# Patient Record
Sex: Female | Born: 1935 | Race: White | Hispanic: No | Smoking: Never smoker
Health system: Southern US, Community
[De-identification: ages and names within clinical notes are randomized; demographics above are authoritative.]

## PROBLEM LIST (undated history)

## (undated) DIAGNOSIS — K5730 Diverticulosis of large intestine without perforation or abscess without bleeding: Secondary | ICD-10-CM

## (undated) DIAGNOSIS — R7309 Other abnormal glucose: Secondary | ICD-10-CM

## (undated) DIAGNOSIS — M1990 Unspecified osteoarthritis, unspecified site: Secondary | ICD-10-CM

## (undated) DIAGNOSIS — F0390 Unspecified dementia without behavioral disturbance: Secondary | ICD-10-CM

## (undated) DIAGNOSIS — J449 Chronic obstructive pulmonary disease, unspecified: Secondary | ICD-10-CM

## (undated) DIAGNOSIS — D649 Anemia, unspecified: Secondary | ICD-10-CM

## (undated) DIAGNOSIS — M545 Low back pain, unspecified: Secondary | ICD-10-CM

## (undated) DIAGNOSIS — I4510 Unspecified right bundle-branch block: Secondary | ICD-10-CM

## (undated) DIAGNOSIS — I10 Essential (primary) hypertension: Secondary | ICD-10-CM

## (undated) DIAGNOSIS — M4800 Spinal stenosis, site unspecified: Secondary | ICD-10-CM

## (undated) DIAGNOSIS — S42302A Unspecified fracture of shaft of humerus, left arm, initial encounter for closed fracture: Secondary | ICD-10-CM

## (undated) DIAGNOSIS — N6009 Solitary cyst of unspecified breast: Secondary | ICD-10-CM

## (undated) DIAGNOSIS — E785 Hyperlipidemia, unspecified: Secondary | ICD-10-CM

## (undated) DIAGNOSIS — N179 Acute kidney failure, unspecified: Secondary | ICD-10-CM

## (undated) DIAGNOSIS — N183 Chronic kidney disease, stage 3 unspecified: Secondary | ICD-10-CM

## (undated) DIAGNOSIS — K219 Gastro-esophageal reflux disease without esophagitis: Secondary | ICD-10-CM

## (undated) DIAGNOSIS — R0602 Shortness of breath: Secondary | ICD-10-CM

## (undated) HISTORY — DX: Solitary cyst of unspecified breast: N60.09

## (undated) HISTORY — DX: Low back pain: M54.5

## (undated) HISTORY — PX: CATARACT EXTRACTION: SUR2

## (undated) HISTORY — DX: Unspecified right bundle-branch block: I45.10

## (undated) HISTORY — DX: Unspecified osteoarthritis, unspecified site: M19.90

## (undated) HISTORY — DX: Other abnormal glucose: R73.09

## (undated) HISTORY — DX: Unspecified fracture of shaft of humerus, left arm, initial encounter for closed fracture: S42.302A

## (undated) HISTORY — DX: Diverticulosis of large intestine without perforation or abscess without bleeding: K57.30

## (undated) HISTORY — DX: Gastro-esophageal reflux disease without esophagitis: K21.9

## (undated) HISTORY — DX: Shortness of breath: R06.02

## (undated) HISTORY — DX: Low back pain, unspecified: M54.50

## (undated) HISTORY — DX: Hyperlipidemia, unspecified: E78.5

## (undated) HISTORY — DX: Spinal stenosis, site unspecified: M48.00

## (undated) HISTORY — PX: OTHER SURGICAL HISTORY: SHX169

## (undated) HISTORY — DX: Essential (primary) hypertension: I10

## (undated) HISTORY — DX: Anemia, unspecified: D64.9

---

## 1999-08-08 ENCOUNTER — Other Ambulatory Visit: Admission: RE | Admit: 1999-08-08 | Discharge: 1999-08-08 | Payer: Self-pay | Admitting: *Deleted

## 2001-04-07 ENCOUNTER — Ambulatory Visit: Admission: RE | Admit: 2001-04-07 | Discharge: 2001-04-07 | Payer: Self-pay | Admitting: Pulmonary Disease

## 2001-04-16 ENCOUNTER — Other Ambulatory Visit: Admission: RE | Admit: 2001-04-16 | Discharge: 2001-04-16 | Payer: Self-pay | Admitting: *Deleted

## 2001-04-29 ENCOUNTER — Encounter: Admission: RE | Admit: 2001-04-29 | Discharge: 2001-04-29 | Payer: Self-pay | Admitting: *Deleted

## 2001-04-29 ENCOUNTER — Encounter: Payer: Self-pay | Admitting: *Deleted

## 2001-08-06 ENCOUNTER — Other Ambulatory Visit: Admission: RE | Admit: 2001-08-06 | Discharge: 2001-08-06 | Payer: Self-pay | Admitting: Internal Medicine

## 2001-08-06 ENCOUNTER — Encounter (INDEPENDENT_AMBULATORY_CARE_PROVIDER_SITE_OTHER): Payer: Self-pay | Admitting: Specialist

## 2001-08-12 ENCOUNTER — Ambulatory Visit (HOSPITAL_COMMUNITY): Admission: RE | Admit: 2001-08-12 | Discharge: 2001-08-12 | Payer: Self-pay | Admitting: Internal Medicine

## 2001-08-12 ENCOUNTER — Encounter: Payer: Self-pay | Admitting: Internal Medicine

## 2001-10-17 ENCOUNTER — Ambulatory Visit (HOSPITAL_COMMUNITY): Admission: RE | Admit: 2001-10-17 | Discharge: 2001-10-17 | Payer: Self-pay | Admitting: Pulmonary Disease

## 2002-12-22 ENCOUNTER — Ambulatory Visit (HOSPITAL_COMMUNITY): Admission: RE | Admit: 2002-12-22 | Discharge: 2002-12-22 | Payer: Self-pay | Admitting: Pulmonary Disease

## 2003-06-23 ENCOUNTER — Ambulatory Visit (HOSPITAL_COMMUNITY): Admission: RE | Admit: 2003-06-23 | Discharge: 2003-06-23 | Payer: Self-pay | Admitting: Pulmonary Disease

## 2004-10-02 ENCOUNTER — Ambulatory Visit: Payer: Self-pay | Admitting: Pulmonary Disease

## 2005-01-01 ENCOUNTER — Ambulatory Visit: Payer: Self-pay | Admitting: Pulmonary Disease

## 2005-05-03 ENCOUNTER — Ambulatory Visit: Payer: Self-pay | Admitting: Pulmonary Disease

## 2005-05-23 ENCOUNTER — Ambulatory Visit (HOSPITAL_COMMUNITY): Admission: RE | Admit: 2005-05-23 | Discharge: 2005-05-23 | Payer: Self-pay | Admitting: Pulmonary Disease

## 2005-07-06 ENCOUNTER — Ambulatory Visit: Payer: Self-pay | Admitting: Pulmonary Disease

## 2005-09-04 ENCOUNTER — Ambulatory Visit: Payer: Self-pay | Admitting: Pulmonary Disease

## 2005-09-28 ENCOUNTER — Ambulatory Visit: Payer: Self-pay

## 2006-01-08 ENCOUNTER — Ambulatory Visit: Payer: Self-pay | Admitting: Pulmonary Disease

## 2006-06-20 ENCOUNTER — Ambulatory Visit: Payer: Self-pay | Admitting: Pulmonary Disease

## 2006-07-09 ENCOUNTER — Ambulatory Visit: Payer: Self-pay | Admitting: Pulmonary Disease

## 2006-09-18 ENCOUNTER — Ambulatory Visit: Payer: Self-pay | Admitting: Pulmonary Disease

## 2006-12-27 ENCOUNTER — Ambulatory Visit: Payer: Self-pay | Admitting: Pulmonary Disease

## 2006-12-27 LAB — CONVERTED CEMR LAB
Alkaline Phosphatase: 64 units/L (ref 39–117)
BUN: 12 mg/dL (ref 6–23)
Basophils Absolute: 0.1 10*3/uL (ref 0.0–0.1)
Basophils Relative: 0.9 % (ref 0.0–1.0)
CO2: 31 meq/L (ref 19–32)
GFR calc Af Amer: 89 mL/min
Hemoglobin: 15.4 g/dL — ABNORMAL HIGH (ref 12.0–15.0)
Iron: 63 ug/dL (ref 42–145)
Lymphocytes Relative: 31.8 % (ref 12.0–46.0)
MCHC: 35 g/dL (ref 30.0–36.0)
Monocytes Absolute: 0.4 10*3/uL (ref 0.2–0.7)
Monocytes Relative: 6.8 % (ref 3.0–11.0)
Neutro Abs: 3.3 10*3/uL (ref 1.4–7.7)
Potassium: 3.8 meq/L (ref 3.5–5.1)
Saturation Ratios: 26.7 % (ref 20.0–50.0)
Sodium: 144 meq/L (ref 135–145)
Total Protein: 7 g/dL (ref 6.0–8.3)
Transferrin: 168.6 mg/dL — ABNORMAL LOW (ref >=212.0)

## 2007-06-25 ENCOUNTER — Ambulatory Visit: Payer: Self-pay | Admitting: Pulmonary Disease

## 2007-09-25 ENCOUNTER — Encounter: Payer: Self-pay | Admitting: Pulmonary Disease

## 2007-09-28 ENCOUNTER — Encounter: Admission: RE | Admit: 2007-09-28 | Discharge: 2007-09-28 | Payer: Self-pay | Admitting: Neurosurgery

## 2007-10-02 ENCOUNTER — Encounter: Payer: Self-pay | Admitting: Pulmonary Disease

## 2007-10-27 ENCOUNTER — Telehealth (INDEPENDENT_AMBULATORY_CARE_PROVIDER_SITE_OTHER): Payer: Self-pay | Admitting: *Deleted

## 2007-10-27 DIAGNOSIS — M545 Low back pain: Secondary | ICD-10-CM

## 2007-10-27 DIAGNOSIS — R7301 Impaired fasting glucose: Secondary | ICD-10-CM | POA: Insufficient documentation

## 2007-10-27 DIAGNOSIS — D649 Anemia, unspecified: Secondary | ICD-10-CM

## 2007-10-27 DIAGNOSIS — I4510 Unspecified right bundle-branch block: Secondary | ICD-10-CM

## 2007-10-27 DIAGNOSIS — I10 Essential (primary) hypertension: Secondary | ICD-10-CM

## 2007-10-27 DIAGNOSIS — E785 Hyperlipidemia, unspecified: Secondary | ICD-10-CM | POA: Insufficient documentation

## 2007-10-28 ENCOUNTER — Encounter: Payer: Self-pay | Admitting: Adult Health

## 2007-10-28 ENCOUNTER — Ambulatory Visit: Payer: Self-pay | Admitting: Pulmonary Disease

## 2007-10-28 DIAGNOSIS — M48061 Spinal stenosis, lumbar region without neurogenic claudication: Secondary | ICD-10-CM | POA: Insufficient documentation

## 2007-11-03 LAB — CONVERTED CEMR LAB
BUN: 28 mg/dL — ABNORMAL HIGH (ref 6–23)
Basophils Absolute: 0 10*3/uL (ref 0.0–0.1)
Calcium: 10 mg/dL (ref 8.4–10.5)
Chloride: 101 meq/L (ref 96–112)
Eosinophils Absolute: 0.1 10*3/uL (ref 0.0–0.6)
Eosinophils Relative: 1.2 % (ref 0.0–5.0)
GFR calc Af Amer: 68 mL/min
GFR calc non Af Amer: 56 mL/min
Iron: 42 ug/dL (ref 42–145)
MCHC: 34.5 g/dL (ref 30.0–36.0)
MCV: 92.9 fL (ref 78.0–100.0)
Monocytes Relative: 5.8 % (ref 3.0–11.0)
Neutro Abs: 6.4 10*3/uL (ref 1.4–7.7)
Platelets: 160 10*3/uL (ref 150–400)
RBC: 4.89 M/uL (ref 3.87–5.11)
TSH: 1.84 microintl units/mL (ref 0.35–5.50)
Transferrin: 183.1 mg/dL — ABNORMAL LOW (ref >=212.0)
WBC: 8.4 10*3/uL (ref 4.5–10.5)

## 2007-11-24 ENCOUNTER — Inpatient Hospital Stay (HOSPITAL_COMMUNITY): Admission: EM | Admit: 2007-11-24 | Discharge: 2007-11-28 | Payer: Self-pay | Admitting: Emergency Medicine

## 2007-11-24 ENCOUNTER — Ambulatory Visit: Payer: Self-pay | Admitting: Internal Medicine

## 2007-11-28 ENCOUNTER — Ambulatory Visit: Payer: Self-pay | Admitting: Internal Medicine

## 2007-12-01 ENCOUNTER — Ambulatory Visit: Payer: Self-pay | Admitting: Pulmonary Disease

## 2007-12-01 DIAGNOSIS — N12 Tubulo-interstitial nephritis, not specified as acute or chronic: Secondary | ICD-10-CM | POA: Insufficient documentation

## 2007-12-01 DIAGNOSIS — K5900 Constipation, unspecified: Secondary | ICD-10-CM

## 2007-12-01 DIAGNOSIS — M1990 Unspecified osteoarthritis, unspecified site: Secondary | ICD-10-CM

## 2007-12-01 DIAGNOSIS — K5730 Diverticulosis of large intestine without perforation or abscess without bleeding: Secondary | ICD-10-CM

## 2007-12-01 DIAGNOSIS — R0602 Shortness of breath: Secondary | ICD-10-CM | POA: Insufficient documentation

## 2007-12-01 DIAGNOSIS — N6009 Solitary cyst of unspecified breast: Secondary | ICD-10-CM | POA: Insufficient documentation

## 2008-01-12 ENCOUNTER — Ambulatory Visit: Payer: Self-pay | Admitting: Pulmonary Disease

## 2008-03-09 ENCOUNTER — Encounter: Payer: Self-pay | Admitting: Pulmonary Disease

## 2008-05-03 ENCOUNTER — Telehealth (INDEPENDENT_AMBULATORY_CARE_PROVIDER_SITE_OTHER): Payer: Self-pay | Admitting: *Deleted

## 2008-06-03 ENCOUNTER — Telehealth: Payer: Self-pay | Admitting: Pulmonary Disease

## 2008-06-15 ENCOUNTER — Ambulatory Visit: Payer: Self-pay | Admitting: Pulmonary Disease

## 2008-06-16 LAB — CONVERTED CEMR LAB
Bilirubin Urine: NEGATIVE
Hemoglobin, Urine: NEGATIVE
Mucus, UA: NEGATIVE
Nitrite: NEGATIVE

## 2008-06-22 ENCOUNTER — Encounter: Payer: Self-pay | Admitting: Pulmonary Disease

## 2008-08-11 ENCOUNTER — Ambulatory Visit: Payer: Self-pay | Admitting: Pulmonary Disease

## 2008-09-08 ENCOUNTER — Telehealth: Payer: Self-pay | Admitting: Pulmonary Disease

## 2008-09-13 ENCOUNTER — Ambulatory Visit: Payer: Self-pay | Admitting: Pulmonary Disease

## 2008-09-13 ENCOUNTER — Telehealth: Payer: Self-pay | Admitting: Pulmonary Disease

## 2008-09-13 ENCOUNTER — Inpatient Hospital Stay (HOSPITAL_COMMUNITY): Admission: EM | Admit: 2008-09-13 | Discharge: 2008-09-20 | Payer: Self-pay | Admitting: Emergency Medicine

## 2008-10-20 ENCOUNTER — Telehealth: Payer: Self-pay | Admitting: Adult Health

## 2008-10-21 ENCOUNTER — Ambulatory Visit: Payer: Self-pay | Admitting: Pulmonary Disease

## 2008-10-21 DIAGNOSIS — R609 Edema, unspecified: Secondary | ICD-10-CM | POA: Insufficient documentation

## 2008-10-26 ENCOUNTER — Ambulatory Visit: Payer: Self-pay

## 2008-10-26 ENCOUNTER — Encounter: Payer: Self-pay | Admitting: Pulmonary Disease

## 2008-12-13 ENCOUNTER — Ambulatory Visit: Payer: Self-pay | Admitting: Pulmonary Disease

## 2008-12-13 DIAGNOSIS — L120 Bullous pemphigoid: Secondary | ICD-10-CM

## 2008-12-13 DIAGNOSIS — N390 Urinary tract infection, site not specified: Secondary | ICD-10-CM

## 2008-12-26 LAB — CONVERTED CEMR LAB
ALT: 18 units/L (ref 0–35)
Basophils Absolute: 0 10*3/uL (ref 0.0–0.1)
Bilirubin Urine: NEGATIVE
Bilirubin, Direct: 0.1 mg/dL (ref 0.0–0.3)
CO2: 36 meq/L — ABNORMAL HIGH (ref 19–32)
Calcium: 10.5 mg/dL (ref 8.4–10.5)
Creatinine, Ser: 0.9 mg/dL (ref 0.4–1.2)
Crystals: NEGATIVE
GFR calc Af Amer: 77 mL/min
GFR calc non Af Amer: 64 mL/min
HCT: 42.2 % (ref 36.0–46.0)
Hemoglobin, Urine: NEGATIVE
Hemoglobin: 14.6 g/dL (ref 12.0–15.0)
Iron: 94 ug/dL (ref 42–145)
MCHC: 34.5 g/dL (ref 30.0–36.0)
Monocytes Absolute: 0.4 10*3/uL (ref 0.1–1.0)
Monocytes Relative: 7.4 % (ref 3.0–12.0)
Neutro Abs: 3.1 10*3/uL (ref 1.4–7.7)
Nitrite: NEGATIVE
Platelets: 175 10*3/uL (ref 150–400)
RDW: 13.7 % (ref 11.5–14.6)
Saturation Ratios: 33.9 % (ref 20.0–50.0)
Sodium: 144 meq/L (ref 135–145)
TSH: 2.42 microintl units/mL (ref 0.35–5.50)
Total Bilirubin: 0.8 mg/dL (ref 0.3–1.2)
Transferrin: 197.8 mg/dL — ABNORMAL LOW (ref >=212.0)
Urobilinogen, UA: 0.2 (ref 0.0–1.0)

## 2009-04-21 ENCOUNTER — Telehealth (INDEPENDENT_AMBULATORY_CARE_PROVIDER_SITE_OTHER): Payer: Self-pay | Admitting: *Deleted

## 2009-04-22 ENCOUNTER — Ambulatory Visit: Payer: Self-pay | Admitting: Internal Medicine

## 2009-05-31 ENCOUNTER — Emergency Department (HOSPITAL_COMMUNITY): Admission: EM | Admit: 2009-05-31 | Discharge: 2009-06-01 | Payer: Self-pay | Admitting: Emergency Medicine

## 2009-06-13 ENCOUNTER — Ambulatory Visit: Payer: Self-pay | Admitting: Pulmonary Disease

## 2009-06-22 ENCOUNTER — Encounter: Payer: Self-pay | Admitting: Pulmonary Disease

## 2009-12-12 ENCOUNTER — Ambulatory Visit: Payer: Self-pay | Admitting: Pulmonary Disease

## 2010-04-25 ENCOUNTER — Ambulatory Visit (HOSPITAL_COMMUNITY): Admission: RE | Admit: 2010-04-25 | Discharge: 2010-04-25 | Payer: Self-pay | Admitting: Orthopedic Surgery

## 2010-05-18 ENCOUNTER — Ambulatory Visit: Payer: Self-pay | Admitting: Pulmonary Disease

## 2010-07-10 ENCOUNTER — Encounter: Payer: Self-pay | Admitting: Pulmonary Disease

## 2010-09-13 ENCOUNTER — Inpatient Hospital Stay (HOSPITAL_COMMUNITY): Admission: RE | Admit: 2010-09-13 | Discharge: 2010-09-18 | Payer: Self-pay | Admitting: Orthopedic Surgery

## 2010-09-15 ENCOUNTER — Telehealth: Payer: Self-pay | Admitting: Pulmonary Disease

## 2010-10-10 ENCOUNTER — Telehealth (INDEPENDENT_AMBULATORY_CARE_PROVIDER_SITE_OTHER): Payer: Self-pay | Admitting: *Deleted

## 2010-10-18 ENCOUNTER — Telehealth: Payer: Self-pay | Admitting: Pulmonary Disease

## 2010-10-19 ENCOUNTER — Ambulatory Visit: Payer: Self-pay | Admitting: Pulmonary Disease

## 2010-10-23 ENCOUNTER — Encounter: Payer: Self-pay | Admitting: Pulmonary Disease

## 2010-10-23 LAB — CONVERTED CEMR LAB
ALT: 11 units/L (ref 0–35)
BUN: 12 mg/dL (ref 6–23)
Basophils Relative: 0.5 % (ref 0.0–3.0)
CO2: 34 meq/L — ABNORMAL HIGH (ref 19–32)
Calcium: 9.8 mg/dL (ref 8.4–10.5)
Chloride: 96 meq/L (ref 96–112)
Creatinine, Ser: 0.9 mg/dL (ref 0.4–1.2)
Eosinophils Relative: 1.4 % (ref 0.0–5.0)
Hemoglobin: 12.8 g/dL (ref 12.0–15.0)
Lymphocytes Relative: 27 % (ref 12.0–46.0)
Neutrophils Relative %: 63.9 % (ref 43.0–77.0)
RBC: 4.23 M/uL (ref 3.87–5.11)
Saturation Ratios: 10.7 % — ABNORMAL LOW (ref 20.0–50.0)
Total Bilirubin: 0.7 mg/dL (ref 0.3–1.2)
Total Protein: 6.9 g/dL (ref 6.0–8.3)
Transferrin: 220.3 mg/dL (ref 212.0–360.0)
WBC: 6.6 10*3/uL (ref 4.5–10.5)

## 2010-10-26 ENCOUNTER — Encounter: Payer: Self-pay | Admitting: Pulmonary Disease

## 2010-10-26 ENCOUNTER — Telehealth: Payer: Self-pay | Admitting: Pulmonary Disease

## 2010-10-26 ENCOUNTER — Ambulatory Visit: Payer: Self-pay | Admitting: Pulmonary Disease

## 2010-10-26 LAB — CONVERTED CEMR LAB
Nitrite: NEGATIVE
Specific Gravity, Urine: 1.015 (ref 1.000–1.030)
Urobilinogen, UA: 0.2 (ref 0.0–1.0)

## 2010-11-14 ENCOUNTER — Ambulatory Visit: Payer: Self-pay | Admitting: Pulmonary Disease

## 2010-12-05 ENCOUNTER — Telehealth (INDEPENDENT_AMBULATORY_CARE_PROVIDER_SITE_OTHER): Payer: Self-pay | Admitting: *Deleted

## 2010-12-21 NOTE — Assessment & Plan Note (Signed)
Summary: 6 MONTH FOLLOW UP--RSC FROM 7-12/PT HERE AT 2:30/LA   CC:  5-6 month ROV & review of mult medical problems....  History of Present Illness: 77 y/o WF here for a follow up visit... she has multiple medical problems as noted below...  Followed for general medical purposes w/ hx chr obstructive asthma, HBP, RBBB, Hypercholesterolemia, borderline DM, DJD, LBP w/ sp stenosis, etc...   ~  December 12, 2009:  good 28mo- only c/o arthritis- esp legs & neck...  given Lyrica by DrAplington ?post shingles pain?... her dyspnea is improved;  BP controlled on meds;  Chol ? & sugar OK on diet alone (reminded that she needs to be fasting for FLP)...    ~  May 18, 2010:  she's had incr difficulty w/ her right THR & DrOlin diagnosed a prob w/ the cement & loosening of the prosthesis- needs revision but she is reluctant...  BP controlled on meds;  no CP/ palpit/ etc;  otherw stable & we discussed once again returning for FASTING blood work...   Current Problem List:  DYSPNEA (ICD-786.05) - long hx of chronic obstructive asthma treated w/ ADVAIR & PROAIR (she uses them Prn now)... she is a non-smoker w/ some reactive airways disease in the past & retired from [employer] after 33 years in 1991... she denies cough, sputum, hemoptysis, worsening dyspnea,  wheezing, chest pains, snoring, daytime hypersomnolence, etc...   ~  baseline CXR w/o acute changes...   ~  PFT's 5/02 w/ FVC 2.07 (68%), FEV1=1.36 (57%), and FEV1/FVC ratio=66%, mid-flows 42%...  ~  CT Angio 7/10 was neg- x biapical pleuroparenchymal scarring...  HYPERTENSION (ICD-401.9) - controlled on NORVASC 10mg tabs- 1/2 tab daily & HCTZ 25mg tab- 1/2tab Daily... BP 130/78 today... tol rx well & denies HA, visual changes, CP, palipit, dizziness, syncope, edema, etc...  RIGHT BUNDLE BRANCH BLOCK (ICD-426.4) - on ASA 81mg /d... baseline EKG w/ RBBB and 2DEcho 5/02 showed mild asymmetric LVH w/ incr EF...  HYPERLIPIDEMIA (ICD-272.4) - on diet alone... she  forgets to come to visits FASTING for this blood work  ~  FLP 8/07 showed TChol 205, TG 71, HDL 49, LDL 130...  DIABETES MELLITUS, BORDERLINE (ICD-790.29) - on diet alone w/ prev BS's in the 100-160 range...  ~  labs in 2008-9 showed BS= 101 to 108...  ~  labs 1/10 showed BS= 106, A1c= 5.9  DIVERTICULOSIS OF COLON (ICD-562.10) - she takes MIRALAX Prn...last colonoscopy 9/02 by DrPerry was WNL...  PYELONEPHRITIS (ICD-590.80) - SEE 1/09 Hospitalization (reviewed)...  Hx of BREAST CYST (ICD-610.0)  DEGENERATIVE JOINT DISEASE (ICD-715.90) - s/p right hip surg 11/09 by DrAplington w/ wound complic...  LOW BACK PAIN SYNDROME (ICD-724.2) & SPINAL STENOSIS (ICD-724.00) - severe LBP & spinal stenosis w/ evals by DrRamos & DrNudelman... s/p shots, considering poss surgery vs alternative therapies... she takes Celebrex, Osteobiflex, MVI, Vit D...  ~  8/10: eval by DrAplington- diff leg lengths, lift placed in right shoe, then trial Lyrica50mg ...  Hx of ANEMIA (ICD-285.9) - eval by GI in 2002 showed normal EGD and colon... prob iron malabsorption problem Rx'd w/ Fe infusion...  ~  labs 1/10 showed Hg= 14.6, MCV= 89, Fe= 94  DERM:  rash Rx'd by dermatology- OLUX-E foam= clobetasol Foam 0.05%...   Preventive Screening-Counseling & Management  Alcohol-Tobacco     Smoking Status: never  Allergies: 1)  ! Adhesive Tape  Comments:  Nurse/Medical Assistant: The patient's medications and allergies were reviewed with the patient and were updated in the Medication and Allergy Lists.  Past History:  Past Medical History: DYSPNEA (ICD-786.05) HYPERTENSION (ICD-401.9) RIGHT BUNDLE BRANCH BLOCK (ICD-426.4) HYPERLIPIDEMIA (ICD-272.4) DIABETES MELLITUS, BORDERLINE (ICD-790.29) DIVERTICULOSIS OF COLON (ICD-562.10) PYELONEPHRITIS (ICD-590.80) Hx of BREAST CYST (ICD-610.0) DEGENERATIVE JOINT DISEASE (ICD-715.90) LOW BACK PAIN SYNDROME (ICD-724.2) SPINAL STENOSIS (ICD-724.00) Hx of ANEMIA  (ICD-285.9)  Past Surgical History: Cataract extraction S/P right hip surgery for fracture after fall 11/09 by DrAplington  Family History: Reviewed history from 06/15/2008 and no changes required. mother deceased age 23   hx fractured hip father deceased age 81  hx of fluid in lungs 1 brother deceased age 66--cancer 1 brother deceased age 36--cancer 1 brother deceased age 27--unknown hx 3 siblings still living  Social History: Reviewed history from 06/15/2008 and no changes required. Patient never smoked.  retired  Review of Systems      See HPI       The patient complains of decreased hearing, dyspnea on exertion, and difficulty walking.  The patient denies anorexia, fever, weight loss, weight gain, vision loss, hoarseness, chest pain, syncope, peripheral edema, prolonged cough, headaches, hemoptysis, abdominal pain, melena, hematochezia, severe indigestion/heartburn, hematuria, incontinence, muscle weakness, suspicious skin lesions, transient blindness, depression, unusual weight change, abnormal bleeding, enlarged lymph nodes, and angioedema.    Vital Signs:  Patient profile:   77 year old female Height:      68 inches Weight:      185 pounds BMI:     28.23 O2 Sat:      94 % on Room air Temp:     97.6 degrees F oral Pulse rate:   80 / minute BP sitting:   130 / 78  (left arm) Cuff size:   large  Vitals Entered By: Randell Loop CMA (May 18, 2010 2:19 PM)  O2 Flow:  Room air CC: 5-6 month ROV & review of mult medical problems... Is Patient Diabetic? No Pain Assessment Patient in pain? yes      Onset of pain  hip pain   Physical Exam  Additional Exam:  WD, WN, Chr ill appearing 77 y/o WF in NAD... GENERAL:  Alert & oriented; pleasant & cooperative... HEENT:  [place]/AT, EOM-full, EACs-clear, TMs-wnl, NOSE-clear, THROAT-clear & wnl. NECK:  Supple w/ fairROM; no JVD; normal carotid impulses w/o bruits; no thyromegaly or nodules palpated; no lymphadenopathy. CHEST:   Clear to P & A; without wheezes/ rales/ or rhonchi heard... HEART:  Regular Rhythm; without murmurs/ rubs/ or gallops detected... ABDOMEN:  Soft & nontender; normal bowel sounds; no organomegaly or masses palpated... EXT: without deformities, mod arthritic changes, +venous insuffic & tr edema., scattered varicose veins... NEURO:  CN's intact; motor testing normal; no focal deficits... DERM:   mild intertrig rash under breast, & onychomycosis of toenails...    Impression & Recommendations:  Problem # 1:  DYSPNEA (ICD-786.05) She has chr obstructive asthma> no exac, she prefers to use the Advair Prn only...  Problem # 2:  HYPERTENSION (ICD-401.9) Controlled on Rx>  continue same. Her updated medication list for this problem includes:    Norvasc 10 Mg Tabs (Amlodipine besylate) .[name]... Take 1/2 tablet by mouth once daily    Hydrochlorothiazide 25 Mg Tabs (Hydrochlorothiazide) .[name]... Take 1  tablet  by mouth once daily  Problem # 3:  HYPERLIPIDEMIA (ICD-272.4) Needs to ret for FLP... continue diet + exercise...  Problem # 4:  DIABETES MELLITUS, BORDERLINE (ICD-790.29) Also diet controlled...  Problem # 5:  DIVERTICULOSIS OF COLON (ICD-562.10) GI is stable on MIRALAX Rx...  Problem # 6:  DEGENERATIVE JOINT DISEASE (  ICD-715.90) Threasa Heads is now followed by drOlin> ?timing of right THR revision?  Complete Medication List: 1)  Advair Diskus 100-50 Mcg/dose Misc (Fluticasone-salmeterol) .... Inhale 2 puffs two times a day as needed 2)  Proair Hfa 108 (90 Base) Mcg/act Aers (Albuterol sulfate) .... 2 sprays every 4-6 hours as needed for wheezing 3)  Norvasc 10 Mg Tabs (Amlodipine besylate) .... Take 1/2 tablet by mouth once daily 4)  Hydrochlorothiazide 25 Mg Tabs (Hydrochlorothiazide) .... Take 1  tablet  by mouth once daily 5)  Pepcid 20 Mg Tabs (Famotidine) .... Take 1 tab by mouth two times a day as needed... 6)  Miralax Powd (Polyethylene glycol 3350) .... Mix 1 capful in water daily.Marland KitchenMarland Kitchen 7)   Osteo Bi-flex Joint Shield Tabs (Misc natural products) .... Take 1 tablet by mouth two times a day 8)  Centrum Silver Tabs (Multiple vitamins-minerals) .... Take 1 tablet by mouth once a day 9)  Vitamin D 400 Unit Tabs (Cholecalciferol) .... Take 1 tablet by mouth once a day 10)  Ferrous Sulfate 325 (65 Fe) Mg Tbec (Ferrous sulfate) .... Take 1 tab by mouth once daily... 11)  Lyrica 50 Mg Caps (Pregabalin) .... Take one by mouth two times a day as needed 12)  Olux-e 0.05 % Foam (Clobetasol propionate emulsion) .... Apply as needed to rash area  Patient Instructions: 1)  Today we updated your med list- see below.... 2)  Continue your current meds the same for now... 3)  Call for any problems.Marland KitchenMarland Kitchen 4)  Let's plan a follow up visit in 6 months w/ FASTING blood work at that time.Marland KitchenMarland Kitchen

## 2010-12-21 NOTE — Progress Notes (Signed)
Summary: weak-ordered labs  Phone Note Call from Patient Call back at Home Phone [phone]   Caller: Daughter-shirley Call For: nadel Summary of Call: pt was very weak last night questioning iron level Initial call taken by: Lacinda Axon,  October 18, 2010 12:18 PM  Follow-up for Phone Call        Spoke with pt's daughter Talbert Forest.  She states that pt woke up in the night and was having sweats and SOB.  She used proair and took this helped.  She also states that since pt had hip surgery in Oct. she has c/o "feeling as weak as water" and daughter questions whether she needs iron level checked.  She states that she has had to have infusions done years ago.  Her next appt with SN is on 11/14/10.  Pls advise thanks! Follow-up by: Vernie Murders,  October 18, 2010 1:56 PM  Additional Follow-up for Phone Call Additional follow up Details #1::        per SN----ok for labs----bmp-401.9/hepat-790.5/cbcd-285.9/fe/tibc--285.9.  thanks Randell Loop CMA  October 18, 2010 2:46 PM    spoke with pt daughter and shse states they will come by tomorrow to have labs done. order placed. Carron Curie CMA  October 18, 2010 2:52 PM

## 2010-12-21 NOTE — Progress Notes (Signed)
Summary: ? coumadin order  Phone Note Call from Patient Call back at Home Phone [phone]   Caller: Daughter--shirley Call For: nadel Reason for Call: Talk to Nurse Summary of Call: Patient was on coumadin while in hospital.  She was to continue coumadin 3 weeks after surgery.  Patient d/c from hospital 10/31 and then went to [facility] place, which had been regulating her coumadin.  Patient now d/c from [facility] and is asking for an order to get coumadin checked here in our lab. Initial call taken by: Lehman Prom,  October 10, 2010 10:42 AM  Follow-up for Phone Call        spoke with pt daughter and she states that the pt had a hip replacement  and was placed on coumadin at that time. She was d/c to [facility] and was being followed there. She has now been d/c home and they were told [facility] was going to come and check pt/inr on sat but they never did. Per daughter pt has not been on Coumadin since 10-02-10.  She is requesting that Dr. Kriste Basque order INR for pt to have drawn here. Please advsie.Carron Curie CMA  October 10, 2010 11:05 AM   Additional Follow-up for Phone Call Additional follow up Details #1::        per SN----protime will not be helpful after being off the med for 6-7 days------she will need to call her ortho doctor to see if they want her to cont the coumadin.  thanks Randell Loop CMA  October 10, 2010 2:50 PM     Additional Follow-up for Phone Call Additional follow up Details #2::    Spoke with Talbert Forest and notified of recs per SN.  She verbalized understanding. Follow-up by: Vernie Murders,  October 10, 2010 3:08 PM

## 2010-12-21 NOTE — Assessment & Plan Note (Signed)
Summary: f/u 6 months///kp   CC:  6 month ROV & review of mult medical problems....  History of Present Illness: 77 y/o WF here for a follow up visit... she has multiple medical problems as noted below...    ~  Jul09:  her CC is LBP and difficulty w/ ambulation- she has seen DrNudelman for eval and has severe sp stenosis... she has tried phys therapy, shots, various meds, etc... DrNudelman has rec surgery but she is reluctant- no discomfort sitting or supine, but difficulty w/ standing, walking, etc... she is going to continue Relafen Bid and tough it out awhile longer... of note: she still mows yard (rides), goes to [employer] frequently to sell her flowers, etc...   ~  December 13, 2008:  here for 20mo follow up- she wants labs and UA done... she is ambulating w/ cane instead of walker (improved w/ PT & 2 epidural steroid shots per DrNudelman)... she is c/o some indigestion and belching... her right leg bothers her (neg ven doppler)... she has a toenail fungus... she wants an iron pill perscription...   ~  June 13, 2009:  she was in ER 2wks ago w/ right CWP, indigestion, gas- "something I ate"... eval included: EKG w/ RBBB, NSSTTWA;  CXR- elev rt hemidiaph, NAD;  CTAngio- neg PE, otherw neg too;  LABS- neg, DDimer= 0.88;   Rx'd w/ GI coctail, Pepcid & improved...   ~  December 12, 2009:  good 20mo- only c/o arthritis- esp legs & neck...  given Lyrica by DrAplington ?post shingles pain?... he dyspnea is improved;  BP controlled on meds;  Chol ? & sugar OK on diet alone (reminded that she needs to be fasting for FLP)...     Current Problem List:  DYSPNEA (ICD-786.05) - long hx of chronic dyspnea treated w/ ADVAIR & PROAIR (she uses them Prn now)... she is a non-smoker w/ some reactive airways disease in the past & retired from [employer] after 33 years in 1991... she denies cough, sputum, hemoptysis, worsening dyspnea,  wheezing, chest pains, snoring, daytime hypersomnolence, etc...   ~   baseline CXR w/o acute changes...   ~  PFT's 5/02 w/ FVC 2.07 (68%), FEV1=1.36 (57%), and FEV1/FVC ratio=66%, mid-flows 42%...  ~  CT [place] 7/10 was neg- x biapical pleuroparenchymal scarring...  HYPERTENSION (ICD-401.9) - controlled on NORVASC 10mg tabs- 1/2 tab daily & HCTZ 25mg tab- 1/2tab Daily... BP 128/84 today... tol rx well & denies HA, visual changes, CP, palipit, dizziness, syncope, edema, etc...  RIGHT BUNDLE BRANCH BLOCK (ICD-426.4) - on ASA 81mg /d... baseline EKG w/ RBBB and 2DEcho 5/02 showed mild asymmetric LVH w/ incr EF...  HYPERLIPIDEMIA (ICD-272.4) - on diet alone... she forgets to come to visits FASTING for this blood work  ~  FLP 8/07 showed TChol 205, TG 71, HDL 49, LDL 130...  DIABETES MELLITUS, BORDERLINE (ICD-790.29) - on diet alone w/ prev BS's in the 100-160 range...  ~  labs in 2008-9 showed BS= 101 to 108...  ~  labs 1/10 showed BS= 106, A1c= 5.9  DIVERTICULOSIS OF COLON (ICD-562.10) - she takes MIRALAX Prn...last colonoscopy 9/02 by DrPerry was WNL...  PYELONEPHRITIS (ICD-590.80) - SEE 1/09 Hospitalization (reviewed)...  Hx of BREAST CYST (ICD-610.0)  DEGENERATIVE JOINT DISEASE (ICD-715.90) - s/p right hip surg 11/09 by DrAplington w/ wound complic...  LOW BACK PAIN SYNDROME (ICD-724.2) & SPINAL STENOSIS (ICD-724.00) - severe LBP & spinal stenosis w/ evals by DrRamos & DrNudelman... s/p shots, considering poss surgery vs alternative therapies... she takes Celebrex, Osteobiflex,  MVI, Vit D...  ~  8/10: eval by DrAplington- diff leg lengths, lift placed in right shoe, then trial Lyrica50mg ...  Hx of ANEMIA (ICD-285.9) - eval by GI in 2002 showed normal EGD and colon... prob iron malabsorption problem Rx'd w/ Fe infusion... and now w/ by mouth Fe supplements...  DERM:  rash Rx'd by dermatology- OLUX-E foam= clobetasol Foam 0.05%...    Allergies (verified): 1)  ! Adhesive Tape  Past History:  Past Medical History:  DYSPNEA (ICD-786.05) HYPERTENSION  (ICD-401.9) RIGHT BUNDLE BRANCH BLOCK (ICD-426.4) HYPERLIPIDEMIA (ICD-272.4) DIABETES MELLITUS, BORDERLINE (ICD-790.29) DIVERTICULOSIS OF COLON (ICD-562.10) PYELONEPHRITIS (ICD-590.80) Hx of BREAST CYST (ICD-610.0) DEGENERATIVE JOINT DISEASE (ICD-715.90) LOW BACK PAIN SYNDROME (ICD-724.2) SPINAL STENOSIS (ICD-724.00) Hx of ANEMIA (ICD-285.9)  Past Surgical History: Cataract extraction S/P right hip surgery for fracture after fall 11/09 by DrAplington  Family History: Reviewed history from 06/15/2008 and no changes required. mother deceased age 75   hx fractured hip father deceased age 62  hx of fluid in lungs 1 brother deceased age 67--cancer 1 brother deceased age 41--cancer 1 brother deceased age 71--unknown hx 3 siblings still living  Social History: Reviewed history from 06/15/2008 and no changes required. Patient never smoked.  retired  Review of Systems      See HPI  The patient denies anorexia, fever, weight loss, weight gain, vision loss, decreased hearing, hoarseness, chest pain, syncope, dyspnea on exertion, peripheral edema, prolonged cough, headaches, hemoptysis, abdominal pain, melena, hematochezia, severe indigestion/heartburn, hematuria, incontinence, muscle weakness, suspicious skin lesions, transient blindness, difficulty walking, depression, unusual weight change, abnormal bleeding, enlarged lymph nodes, and angioedema.    Vital Signs:  Patient profile:   77 year old female Height:      68 inches Weight:      191.13 pounds O2 Sat:      96 % on Room air Temp:     96.8 degrees F oral Pulse rate:   61 / minute BP sitting:   128 / 84  (right arm) Cuff size:   regular  Vitals Entered By: Abigail Miyamoto RN (December 12, 2009 10:36 AM)  O2 Flow:  Room air  Physical Exam  Additional Exam:  WD, WN, Chr ill appearing 77 y/o WF in NAD... GENERAL:  Alert & oriented; pleasant & cooperative... HEENT:  [place]/AT, EOM-full, EACs-clear, TMs-wnl, NOSE-clear,  THROAT-clear & wnl. NECK:  Supple w/ fairROM; no JVD; normal carotid impulses w/o bruits; no thyromegaly or nodules palpated; no lymphadenopathy. CHEST:  Clear to P & A; without wheezes/ rales/ or rhonchi heard... HEART:  Regular Rhythm; without murmurs/ rubs/ or gallops detected... ABDOMEN:  Soft & nontender; normal bowel sounds; no organomegaly or masses palpated... EXT: without deformities, mod arthritic changes, +venous insuffic & tr edema., scattered varicose veins... NEURO:  CN's intact; motor testing normal; no focal deficits... DERM:   mild intertrig rash under breast, & onychomycosis of toenails...     Impression & Recommendations:  Problem # 1:  DYSPNEA (ICD-786.05) Improved-  continue current Rx.  Problem # 2:  HYPERTENSION (ICD-401.9) Controlled-  same meds. Her updated medication list for this problem includes:    Norvasc 10 Mg Tabs (Amlodipine besylate) .[name]... Take 1/2 tablet by mouth once daily    Hydrochlorothiazide 25 Mg Tabs (Hydrochlorothiazide) .[name]... Take 1/2 tablets by mouth once daily  Problem # 3:  HYPERLIPIDEMIA (ICD-272.4) On diet alone- asked to ret fasting for FLP...  Problem # 4:  DIABETES MELLITUS, BORDERLINE (ICD-790.29) Controlled on diet alone...  Problem # 5:  DIVERTICULOSIS OF COLON (  ICD-562.10) GI is stable...  Problem # 6:  DEGENERATIVE JOINT DISEASE (ICD-715.90) Eval 7 rx by drAplington et al... The following medications were removed from the medication list:    Celebrex 100 Mg Caps (Celecoxib) .[name]... Take 1 tablet by mouth once a day  Problem # 7:  OTHER MEDICAL PROBLEMS AS NOTED>>  Complete Medication List: 1)  Advair Diskus 100-50 Mcg/dose Misc (Fluticasone-salmeterol) .... Inhale 2 puffs two times a day as needed 2)  Proair Hfa 108 (90 Base) Mcg/act Aers (Albuterol sulfate) .... 2 sprays every 4-6 hours as needed for wheezing 3)  Norvasc 10 Mg Tabs (Amlodipine besylate) .... Take 1/2 tablet by mouth once daily 4)  Hydrochlorothiazide  25 Mg Tabs (Hydrochlorothiazide) .... Take 1/2 tablets by mouth once daily 5)  Pepcid 20 Mg Tabs (Famotidine) .... Take 1 tab by mouth two times a day as needed... 6)  Miralax Powd (Polyethylene glycol 3350) .... Mix 1 capful in water daily.Marland KitchenMarland Kitchen 7)  Osteo Bi-flex Joint Shield Tabs (Misc natural products) .... Take 1 tablet by mouth two times a day 8)  Centrum Silver Tabs (Multiple vitamins-minerals) .... Take 1 tablet by mouth once a day 9)  Vitamin D 400 Unit Tabs (Cholecalciferol) .... Take 1 tablet by mouth once a day 10)  Ferrous Sulfate 325 (65 Fe) Mg Tbec (Ferrous sulfate) .... Take 1 tab by mouth once daily... 11)  Lyrica 50 Mg Caps (Pregabalin) .... Take one by mouth two times a day as needed 12)  Olux-e 0.05 % Foam (Clobetasol propionate emulsion) .... Apply as needed to rash area  Other Orders: Flu Vaccine 70yrs + (16109) Administration Flu vaccine - MCR (U0454)  Patient Instructions: 1)  Today we updated your med list- see below.... 2)  Continue your current meds the same & try the [facility] 50mg  twice daily to see if it helps... 3)  Stay as active as possible... 4)  Today we gave you the 2010 Flu vaccine... 5)  Call for any problems.Marland KitchenMarland Kitchen 6)  Please schedule a follow-up appointment in 6 months, and we will plan FASTING blood work at that visit... Flu Vaccine Consent Questions     Do you have a history of severe allergic reactions to this vaccine? no    Any prior history of allergic reactions to egg and/or gelatin? no    Do you have a sensitivity to the preservative Thimersol? no    Do you have a past history of Guillan-Barre Syndrome? no    Do you currently have an acute febrile illness? no    Have you ever had a severe reaction to latex? no    Vaccine information given and explained to patient? yes    Are you currently pregnant? no    Lot Number:[id number]   Exp Date:05/18/2010   Site Given  Right  Deltoid IM Randell Loop CMA  December 12, 2009 12:26 PM     .[email or web address]     Appended Document: meds updated today Medications Added HYDROCHLOROTHIAZIDE 25 MG  TABS (HYDROCHLOROTHIAZIDE) Take 1  tablet  by mouth once daily          Clinical Lists Changes  Medications: Changed medication from HYDROCHLOROTHIAZIDE 25 MG  TABS (HYDROCHLOROTHIAZIDE) Take 1/2 tablets by mouth once daily to HYDROCHLOROTHIAZIDE 25 MG  TABS (HYDROCHLOROTHIAZIDE) Take 1  tablet  by mouth once daily

## 2010-12-21 NOTE — Progress Notes (Signed)
Summary: prescription---[id number]  Phone Note Call from Patient   Caller: daughter/[name] Broom Call For: Dr. Kriste Basque Summary of Call: Ms Battie's daughter came by stated that when they were here in December Dr. Kriste Basque gave them samples of Advir and told her that we did not have any samples of ProAir but to check back in about a month to see if we have any samples. Per Florentina Addison we did not have samples but if she wanted I could put up a message and have a prescription called into [facility]. Ms Shellee Milo stated that there are some insurance changes and her mother may have to switch from [facility] to [facility] she is going to talk to [facility] and call me back to advise which pharmacy to call the ProAir into. She can be reached at [phone] I held this message all afternoon and Ms Shellee Milo did not call me back to advise which pharmacy that we needed to call the ProAir into. Initial call taken by: Vedia Coffer,  December 05, 2010 1:46 PM  Follow-up for Phone Call        [facility].  Arman Filter LPN  December 05, 2010 5:10 PM   called and spoke with pt's daughter, Talbert Forest.  Talbert Forest stated she still is unsure what pharmacy she wants Proair to be sent to.  Daughter states she will call me back when she knows this.  will sign off on this message and wait for daughter to call us back.  Aundra Millet Reynolds LPN  December 06, 2010 4:42 PM

## 2010-12-21 NOTE — Assessment & Plan Note (Signed)
Summary: 6 months/fasting/apc   CC:  6 month ROV & review of mult medical problems....  History of Present Illness: 77 y/o WF here for a follow up visit... she has multiple medical problems as noted below...  Followed for general medical purposes w/ hx chr obstructive asthma, HBP, RBBB, Hypercholesterolemia, borderline DM, DJD, LBP w/ sp stenosis, etc...   ~  December 12, 2009:  good 55mo- only c/o arthritis- esp legs & neck...  given Lyrica by DrAplington ?post shingles pain?... her dyspnea is improved;  BP controlled on meds;  Chol ? & sugar OK on diet alone (reminded that she needs to be fasting for FLP)...    ~  May 18, 2010:  she's had incr difficulty w/ her right hip hemiarthroplasty & DrOlin diagnosed a prob w/ the cement & loosening of the prosthesis- needs revision but she is reluctant...  BP controlled on meds;  no CP/ palpit/ etc;  otherw stable & we discussed once again returning for FASTING blood work...   ~  November 14, 2010:  she had right hip hemiarthroplasty converted to a right THR by DrAlusio 10/11 & now much improved- still getting Pt etc... she also notes that he prev dyspnea was due to constipation- now much improved w/ peppermint tea + Senakot/ Miralax, and sauerkraut "I listen to DrOz"... BP controlled & denies CP, palpit, dyspnea, etc;  labs from hosp & 12/11 reviewed... she does not want the Flu shot.   Current Problem List:  DYSPNEA (ICD-786.05) - long hx of chronic obstructive asthma treated w/ ADVAIR & PROAIR (she uses them Prn now)... she is a non-smoker w/ some reactive airways disease in the past & retired from [employer] after 33 years in 1991... she denies cough, sputum, hemoptysis, worsening dyspnea,  wheezing, chest pains, snoring, daytime hypersomnolence, etc...   ~  baseline CXR w/o acute changes...   ~  PFT's 5/02 w/ FVC 2.07 (68%), FEV1=1.36 (57%), and FEV1/FVC ratio=66%, mid-flows 42%...  ~  CT Angio 7/10 was neg- x biapical pleuroparenchymal scarring...  ~  CXR 10/11 showed sl elev right hemidaiph, mild DJD sp, osteopenia, NAD...  HYPERTENSION (ICD-401.9) - controlled on NORVASC 10mg tabs- 1/2 tab daily & HCTZ 25mg tab daily... BP 148/84 today... tol rx well & denies HA, visual changes, CP, palipit, dizziness, syncope, edema, etc...  RIGHT BUNDLE BRANCH BLOCK (ICD-426.4) - on ASA 81mg /d... baseline EKG w/ RBBB and 2DEcho 5/02 showed mild asymmetric LVH w/ incr EF...  HYPERLIPIDEMIA (ICD-272.4) - on diet alone... she forgets to come to visits FASTING for this blood work  ~  FLP 8/07 showed TChol 205, TG 71, HDL 49, LDL 130...  DIABETES MELLITUS, BORDERLINE (ICD-790.29) - on diet alone w/ prev BS's in the 100-160 range...  ~  labs in 2008-9 showed BS= 101 to 108...  ~  labs 1/10 showed BS= 106, A1c= 5.9  DIVERTICULOSIS OF COLON (ICD-562.10) - she takes SENAKOT-S, MIRALAX, Peppermint Tea, & sauerkraut Prn...last colonoscopy 9/02 by DrPerry was WNL...  PYELONEPHRITIS (ICD-590.80) - SEE 1/09 Hospitalization (reviewed)...  Hx of BREAST CYST (ICD-610.0)  DEGENERATIVE JOINT DISEASE (ICD-715.90) - s/p right hip hemiarthroplasty 11/09 by DrAplington w/ wound complic... then dx w/ loosening of the femoral shaft & had conversion to right THR by DrAlusio 10/11 & much improved... she uses CELEBREX 200mg  Prn (seldom takes this).  LOW BACK PAIN SYNDROME (ICD-724.2) & SPINAL STENOSIS (ICD-724.00) - severe LBP & spinal stenosis w/ evals by DrRamos & DrNudelman... s/p shots, considering poss surgery vs alternative therapies... she takes Celebrex,  Osteobiflex, MVI, Vit D...  ~  8/10: eval by DrAplington- diff leg lengths, lift placed in right shoe, then trial Lyrica50mg ...  ~  12/11:  improved after hip revision surg w/ better ambulaton...  Hx of ANEMIA (ICD-285.9) - eval by GI in 2002 showed normal EGD and Colon... prob iron malabsorption problem Rx'd w/ Fe infusion...  ~  labs 1/10 showed Hg= 14.6, MCV= 89, Fe= 94  ~  labs 12/11 showed Hg= 12.8, MCV= 90, Fe=  33... try Fe supplement + VitC...  DERM:  rash Rx'd by dermatology- OLUX-E foam= clobetasol Foam 0.05%...   Allergies: 1)  ! Cipro 2)  ! Adhesive Tape  Comments:  Nurse/Medical Assistant: The patient's medications and allergies were reviewed with the patient and were updated in the Medication and Allergy Lists.  Past History:  Past Medical History: DYSPNEA (ICD-786.05) HYPERTENSION (ICD-401.9) RIGHT BUNDLE BRANCH BLOCK (ICD-426.4) HYPERLIPIDEMIA (ICD-272.4) DIABETES MELLITUS, BORDERLINE (ICD-790.29) DIVERTICULOSIS OF COLON (ICD-562.10) PYELONEPHRITIS (ICD-590.80) Hx of BREAST CYST (ICD-610.0) DEGENERATIVE JOINT DISEASE (ICD-715.90) LOW BACK PAIN SYNDROME (ICD-724.2) SPINAL STENOSIS (ICD-724.00) Hx of ANEMIA (ICD-285.9)  Past Surgical History: Cataract extraction S/P right hip hemiarthroplasty for fracture after fall 11/09 by DrAplington S/P conversion to right THR 10/11 by DrAlusio  Family History: Reviewed history from 05/18/2010 and no changes required. mother deceased age 39   hx fractured hip father deceased age 47  hx of fluid in lungs 1 brother deceased age 58--cancer 1 brother deceased age 4--cancer 1 brother deceased age 75--unknown hx 3 siblings still living  Social History: Reviewed history from 06/15/2008 and no changes required. Patient never smoked.  retired  Review of Systems      See HPI       The patient complains of decreased hearing, dyspnea on exertion, muscle weakness, and difficulty walking.  The patient denies anorexia, fever, weight loss, weight gain, vision loss, hoarseness, chest pain, syncope, peripheral edema, prolonged cough, headaches, hemoptysis, abdominal pain, melena, hematochezia, severe indigestion/heartburn, hematuria, incontinence, suspicious skin lesions, transient blindness, depression, unusual weight change, abnormal bleeding, enlarged lymph nodes, and angioedema.    Vital Signs:  Patient profile:   77 year old  female Height:      68 inches Weight:      181.13 pounds BMI:     27.64 O2 Sat:      92 % on Room air Temp:     97.3 degrees F oral Pulse rate:   93 / minute BP sitting:   148 / 84  (left arm) Cuff size:   regular  Vitals Entered By: Randell Loop CMA (November 14, 2010 2:35 PM)  O2 Sat at Rest %:  92 O2 Flow:  Room air CC: 6 month ROV & review of mult medical problems... Is Patient Diabetic? No Pain Assessment Patient in pain? no      Comments meds updated today with pt   Physical Exam  Additional Exam:  WD, WN, Chr ill appearing 77 y/o WF in NAD... GENERAL:  Alert & oriented; pleasant & cooperative... HEENT:  [place]/AT, EOM-full, EACs-clear, TMs-wnl, NOSE-clear, THROAT-clear & wnl. NECK:  Supple w/ fairROM; no JVD; normal carotid impulses w/o bruits; no thyromegaly or nodules palpated; no lymphadenopathy. CHEST:  Clear to P & A; without wheezes/ rales/ or rhonchi heard... HEART:  Regular Rhythm; without murmurs/ rubs/ or gallops detected... ABDOMEN:  Soft & nontender; normal bowel sounds; no organomegaly or masses palpated... EXT:  mod arthritic changes, walks w/ cane, +venous insuffic & tr edema., scattered varicose veins... NEURO:  CN's  intact; motor testing normal; no focal deficits... DERM:   mild intertrig rash under breast, & onychomycosis of toenails...    MISC. Report  Procedure date:  11/14/2010  Findings:      DATA REVIEWED:  ~  Hosp records from 10/11 THR surg w/ XRAYS, LABS, etc...   Impression & Recommendations:  Problem # 1:  DYSPNEA (ICD-786.05) Improved w/ treatment of constipation she says!!!  Continue Advair, Proair, etc...  Problem # 2:  HYPERTENSION (ICD-401.9) Fair control on meds>  reminded to elim salt, etc... Her updated medication list for this problem includes:    Norvasc 10 Mg Tabs (Amlodipine besylate) .[name]... Take 1/2 tablet by mouth once daily    Hydrochlorothiazide 25 Mg Tabs (Hydrochlorothiazide) .[name]... Take 1  tablet  by mouth once  daily  Problem # 3:  RIGHT BUNDLE BRANCH BLOCK (ICD-426.4) Denies CP, palpit, dizzy, syncope, edema, etc...  Problem # 4:  HYPERLIPIDEMIA (ICD-272.4) Reminded to ret for FASTING blood work...  Problem # 5:  DIABETES MELLITUS, BORDERLINE (ICD-790.29) Diet controlled>  no new complaint or concern...  Problem # 6:  DEGENERATIVE JOINT DISEASE (ICD-715.90) Much improved now post THR surg... Her updated medication list for this problem includes:    Celebrex 200 Mg Caps (Celecoxib) .[name]... Take 1 cap as needed for arthritis pain...  Problem # 7:  CONSTIPATION (ICD-564.00) She is on a good regimen now & very pleased w/ results... Her updated medication list for this problem includes:    Miralax Powd (Polyethylene glycol 3350) ..... Mix 1 capful in water daily...  Problem # 8:  OTHER MEDICAL PROB AS NOTED>>> she declines fl vaccine today...  Complete Medication List: 1)  Advair Diskus 100-50 Mcg/dose Misc (Fluticasone-salmeterol) .... Inhale 2 puffs two times a day as needed 2)  Proair Hfa 108 (90 Base) Mcg/act Aers (Albuterol sulfate) .... 2 sprays every 4-6 hours as needed for wheezing 3)  Norvasc 10 Mg Tabs (Amlodipine besylate) .... Take 1/2 tablet by mouth once daily 4)  Hydrochlorothiazide 25 Mg Tabs (Hydrochlorothiazide) .... Take 1  tablet  by mouth once daily 5)  Miralax Powd (Polyethylene glycol 3350) .... Mix 1 capful in water daily.Marland KitchenMarland Kitchen 6)  Celebrex 200 Mg Caps (Celecoxib) .... Take 1 cap as needed for arthritis pain.Marland KitchenMarland Kitchen 7)  Osteo Bi-flex Joint Shield Tabs (Misc natural products) .... Take 1 tablet by mouth two times a day 8)  Tums 500 Mg Chew (Calcium carbonate antacid) .... As needed 9)  Centrum Silver Tabs (Multiple vitamins-minerals) .... Take 1 tablet by mouth once a day 10)  Vitamin D 400 Unit Tabs (Cholecalciferol) .... Take 1 tablet by mouth once a day 11)  Feosol 200 (65 Fe) Mg Tabs (Ferrous sulfate dried) .... Take one tablet by mouth two times a day along with vit c 500mg   with each dose 12)  Olux-e 0.05 % Foam (Clobetasol propionate emulsion) .... Apply as needed to rash area  Patient Instructions: 1)  Today we updated your med list- see below.... 2)  Continue your current meds the same for now... 3)  Try an iron supplement that won't cause constiopation eg- Ferrosequels... 4)  Call for any problems.Marland KitchenMarland Kitchen 5)  Please schedule a follow-up appointment in 6 months w/ FASTING blood work at that time.Marland KitchenMarland Kitchen

## 2010-12-21 NOTE — Letter (Signed)
Summary: Surgical Clearance/[facility]  Surgical Clearance/[facility]   Imported By: Sherian Rein 07/14/2010 11:47:47  _____________________________________________________________________  External Attachment:    Type:   Image     Comment:   External Document

## 2010-12-21 NOTE — Progress Notes (Signed)
Summary: shots/ pt in hosp  Phone Note Call from Patient   Caller: [name] Call For: Notnamed Croucher Summary of Call: pt had total hip replacement. is at [facility] hosp room 1605. daughter [name] wants to know if pt needs pna and flu shots. cell [phone] Initial call taken by: Tivis Ringer, CNA,  September 15, 2010 11:17 AM  Follow-up for Phone Call        Spoke with pt daughter and advised that if pt had not had flu shot this season then she would need that. She states pt last PNA shot was in last 5 years so I advised she would not need another one of those at this time. Carron Curie CMA  September 15, 2010 12:09 PM

## 2010-12-21 NOTE — Progress Notes (Signed)
Summary: ?UTI  Phone Note Call from Patient Call back at Home Phone [phone]   Caller: Wilhelmenia Blase  Call For: Kriste Basque Reason for Call: Talk to Nurse, Talk to Doctor Summary of Call: Pt has been c/o urinary urgency, frequency, fatigue, nausea, decreased appetite and lower back pain starting last night. Pt never started Cephalexin 500mg  two times a day as per directed by Dr. Berton Lan due to having (r) total hip replacement 09/13/2010. Pt has tried Cipro in the past but experienced hallucinations. Daughter is here with pt, I called to side B for SN protocol and sent pt to lab for udip with C&S STAT. Will place phone note on hold to [place] pending results Initial call taken by: Zackery Barefoot CMA,  October 26, 2010 10:30 AM  Follow-up for Phone Call        called and spoke with pts daughter and the daughter stated that pt is unable to take the cipro--causes hallucinations---please advise what other med to call in. Randell Loop CMA  October 26, 2010 4:26 PM   Additional Follow-up for Phone Call Additional follow up Details #1::        called and spoke with pts daugher---per SN we will send in septra ds for her to take until gone.  pts daughter is aware of this and that this med has been sent to the pharmacy Randell Loop [facility]  October 26, 2010 4:30 PM    New Allergies: ! CIPRO New/Updated Medications: SEPTRA DS 800-160 MG TABS (SULFAMETHOXAZOLE-TRIMETHOPRIM) 1 by mouth two times a day until gone New Allergies: ! CIPROPrescriptions: SEPTRA DS 800-160 MG TABS (SULFAMETHOXAZOLE-TRIMETHOPRIM) 1 by mouth two times a day until gone  #14 x 0   Entered by:   Randell Loop CMA   Authorized by:   Michele Mcalpine MD   Signed by:   Randell Loop CMA on 10/26/2010   Method used:   Electronically to        [facility]* (retail)       [address]       [place], [place]  [id number]       Ph: [phone]       Fax: [phone]   RxID:   [id number]

## 2010-12-21 NOTE — Miscellaneous (Signed)
Summary: meds updated  Medications Added FEOSOL 200 (65 FE) MG TABS (FERROUS SULFATE DRIED) take one tablet by mouth two times a day along with vit c 500mg  with each dose       Clinical Lists Changes  Medications: Added new medication of FEOSOL 200 (65 FE) MG TABS (FERROUS SULFATE DRIED) take one tablet by mouth two times a day along with vit c 500mg  with each dose

## 2011-01-11 ENCOUNTER — Telehealth (INDEPENDENT_AMBULATORY_CARE_PROVIDER_SITE_OTHER): Payer: Self-pay | Admitting: *Deleted

## 2011-01-16 NOTE — Progress Notes (Signed)
Summary: SOB/ diarrhea---rx for Septra  Phone Note Call from Patient   Caller: Daughter [name] Call For: nadel Summary of Call: pt 'almost fainted sunday. also has diarrhea (began on sunday as well). SOB. no fever. daughter thinks pt may need labs done to check for UTI or iron deficiency. says that pt slept well last night (pt told daughter this as well). daughter has an appt with dr nadel monday and will let pt come in her place if neede. pls advise. [facility] # [phone] Initial call taken by: Kathleen Perdue, CNA,  January 11, 2011 9:27 AM  Follow-up for Phone Call        called and spoke with pt\'s daughter. Daughter states pt c/o feeling weak, dizzy, and light headed on Sunday 01/07/2011 and yesterday.  Also c/o increased sob on Monday 2/20 and yesterday but states proair helped with this.  Daughter wonders if pt may have UTI or low iron.  Pt denies fever, burning, urinary frequency or urgency. States urine is of "normal color and odor."  Daughter also states her sister passed away recently due to MI and pt states she is "under a lot of pressure."  Therefore daughter unsure if the dizziness and weakness is related to the recent passing or d/t ? UTI.  Will forward message to Sn to address.  Megan Reynolds LPN  January 11, 2011 10:02 AM   allergies: Cipro, Adhesive Tape.  Additional Follow-up for Phone Call Additional follow up Details #1::        per SN----all of these symptoms can be caused by the diarrhea/gastroenteritis---use immodium otc and clear liquid diet---can also come from the stress-----recs are to treat with septra ds #10  1 by mouth two times a day until gone--just in case she has a bug and increase her fluid intake.  if she is not better by monday then she can take her daughters appt. Leigh Adkins CMA  January 11, 2011 11:54 AM     Additional Follow-up for Phone Call Additional follow up Details #2::    called and spoke with pt\'s daughter.  informed  her of Sn\'s recs.  Daughter states she doesn\'t believe the diarrhea is from gastroenteritis. She states pt had constipation and had taken Senakot and believes that is where the diarrhea came from.  pt hasn\'t had diarrhea today.  also informed pt rx for Septra sent to pharmacy.  Daughter is aware that if pt\'s symptoms do not improve, than pt can take her daughter\'s appt on monday.  daughter verbalized understanding and denied any questions.  daughter also requested a sample of proair.  1 sample left at front desk.  Megan Reynolds LPN  January 11, 2011 12:12 PM   New/Updated Medications: SEPTRA DS 800-160 MG TABS (SULFAMETHOXAZOLE-TRIMETHOPRIM) Take 1 tablet by mouth two times a day until gone. Prescriptions: SEPTRA DS 800-160 MG TABS (SULFAMETHOXAZOLE-TRIMETHOPRIM) Take 1 tablet by mouth two times a day until gone.  #10 x 0   Entered by:   Megan Reynolds LPN   Authorized by:   Scott M Nadel MD   Signed by:   Megan Reynolds LPN on 01/11/2011   Method used:   Electronically to        [facility]* (retail)       [address]       [place], [place]  [id number]       Ph: [phone]       Fax: [phone]   RxID:   [id number]

## 2011-01-31 LAB — TYPE AND SCREEN

## 2011-01-31 LAB — CBC
HCT: 27.2 % — ABNORMAL LOW (ref 36.0–46.0)
HCT: 29.6 % — ABNORMAL LOW (ref 36.0–46.0)
HCT: 31 % — ABNORMAL LOW (ref 36.0–46.0)
HCT: 42.2 % (ref 36.0–46.0)
Hemoglobin: 10.2 g/dL — ABNORMAL LOW (ref 12.0–15.0)
Hemoglobin: 14.4 g/dL (ref 12.0–15.0)
Hemoglobin: 9.1 g/dL — ABNORMAL LOW (ref 12.0–15.0)
MCH: 30.9 pg (ref 26.0–34.0)
MCH: 31.2 pg (ref 26.0–34.0)
MCH: 31.2 pg (ref 26.0–34.0)
MCHC: 34.2 g/dL (ref 30.0–36.0)
MCHC: 34.4 g/dL (ref 30.0–36.0)
MCV: 90.7 fL (ref 78.0–100.0)
MCV: 91.1 fL (ref 78.0–100.0)
MCV: 92.8 fL (ref 78.0–100.0)
Platelets: 138 10*3/uL — ABNORMAL LOW (ref 150–400)
RBC: 2.93 MIL/uL — ABNORMAL LOW (ref 3.87–5.11)
RBC: 3.26 MIL/uL — ABNORMAL LOW (ref 3.87–5.11)
RBC: 3.4 MIL/uL — ABNORMAL LOW (ref 3.87–5.11)
RDW: 13.1 % (ref 11.5–15.5)
RDW: 13.3 % (ref 11.5–15.5)
WBC: 11.1 10*3/uL — ABNORMAL HIGH (ref 4.0–10.5)
WBC: 11.3 10*3/uL — ABNORMAL HIGH (ref 4.0–10.5)

## 2011-01-31 LAB — URINALYSIS, ROUTINE W REFLEX MICROSCOPIC
Bilirubin Urine: NEGATIVE
Glucose, UA: NEGATIVE mg/dL
Hgb urine dipstick: NEGATIVE
Ketones, ur: NEGATIVE mg/dL
pH: 6 (ref 5.0–8.0)

## 2011-01-31 LAB — BASIC METABOLIC PANEL
BUN: 11 mg/dL (ref 6–23)
CO2: 32 mEq/L (ref 19–32)
Chloride: 101 mEq/L (ref 96–112)
Chloride: 104 mEq/L (ref 96–112)
GFR calc Af Amer: >60 mL/min (ref >60)
Potassium: 4.6 mEq/L (ref 3.5–5.1)
Sodium: 135 mEq/L (ref 135–145)

## 2011-01-31 LAB — COMPREHENSIVE METABOLIC PANEL
ALT: 14 U/L (ref 0–35)
AST: 23 U/L (ref 0–37)
Alkaline Phosphatase: 80 U/L (ref 39–117)
CO2: 31 mEq/L (ref 19–32)
Chloride: 100 mEq/L (ref 96–112)
GFR calc Af Amer: 57 mL/min — ABNORMAL LOW (ref >60)
GFR calc non Af Amer: 47 mL/min — ABNORMAL LOW (ref >60)
Glucose, Bld: 110 mg/dL — ABNORMAL HIGH (ref 70–99)
Sodium: 141 mEq/L (ref 135–145)
Total Bilirubin: 0.6 mg/dL (ref 0.3–1.2)

## 2011-01-31 LAB — URINE MICROSCOPIC-ADD ON

## 2011-01-31 LAB — PROTIME-INR
INR: 1.51 — ABNORMAL HIGH (ref 0.00–1.49)
INR: 1.62 — ABNORMAL HIGH (ref 0.00–1.49)
Prothrombin Time: 18.4 seconds — ABNORMAL HIGH (ref 11.6–15.2)
Prothrombin Time: 19.4 seconds — ABNORMAL HIGH (ref 11.6–15.2)
Prothrombin Time: 19.8 seconds — ABNORMAL HIGH (ref 11.6–15.2)

## 2011-01-31 LAB — SURGICAL PCR SCREEN: MRSA, PCR: NEGATIVE

## 2011-02-25 LAB — COMPREHENSIVE METABOLIC PANEL
AST: 24 U/L (ref 0–37)
BUN: 11 mg/dL (ref 6–23)
CO2: 29 mEq/L (ref 19–32)
Calcium: 10.3 mg/dL (ref 8.4–10.5)
Creatinine, Ser: 0.83 mg/dL (ref 0.4–1.2)
GFR calc Af Amer: >60 mL/min (ref >60)
GFR calc non Af Amer: >60 mL/min (ref >60)
Glucose, Bld: 115 mg/dL — ABNORMAL HIGH (ref 70–99)
Total Bilirubin: 0.7 mg/dL (ref 0.3–1.2)

## 2011-02-25 LAB — URINALYSIS, ROUTINE W REFLEX MICROSCOPIC
Hgb urine dipstick: NEGATIVE
Ketones, ur: NEGATIVE mg/dL
Protein, ur: NEGATIVE mg/dL
Urobilinogen, UA: 0.2 mg/dL (ref 0.0–1.0)

## 2011-02-25 LAB — POCT CARDIAC MARKERS
CKMB, poc: <1 ng/mL — ABNORMAL LOW (ref 1.0–8.0)
Myoglobin, poc: 82.5 ng/mL (ref 12–200)
Troponin i, poc: <0.05 ng/mL (ref 0.00–0.09)
Troponin i, poc: <0.05 ng/mL (ref 0.00–0.09)

## 2011-02-25 LAB — D-DIMER, QUANTITATIVE: D-Dimer, Quant: 0.88 ug/mL-FEU — ABNORMAL HIGH (ref 0.00–0.48)

## 2011-02-25 LAB — URINE MICROSCOPIC-ADD ON

## 2011-02-25 LAB — DIFFERENTIAL
Basophils Absolute: 0.1 10*3/uL (ref 0.0–0.1)
Lymphocytes Relative: 40 % (ref 12–46)
Lymphs Abs: 2.3 10*3/uL (ref 0.7–4.0)
Neutro Abs: 2.8 10*3/uL (ref 1.7–7.7)
Neutrophils Relative %: 48 % (ref 43–77)

## 2011-02-25 LAB — CBC
HCT: 44.6 % (ref 36.0–46.0)
MCHC: 34.2 g/dL (ref 30.0–36.0)
MCV: 91.5 fL (ref 78.0–100.0)
RBC: 4.88 MIL/uL (ref 3.87–5.11)

## 2011-03-08 ENCOUNTER — Telehealth: Payer: Self-pay | Admitting: Pulmonary Disease

## 2011-03-08 MED ORDER — CHLORZOXAZONE 250 MG PO TABS: 250.0000 mg | ORAL_TABLET | Freq: Three times a day (TID) | ORAL | Status: AC | PRN

## 2011-03-08 NOTE — Telephone Encounter (Signed)
Called and spoke with [name]-pts daughter and she is aware per SN that we can send in parafon forte for the muscle spasms--pt is ok with this and will try this med to see if this helps.  She is aware that per SN  There is really nothing else to do for the ringing in her ears.  Pt voiced her understanding of this and they stated that they may try an ENT for this.

## 2011-03-08 NOTE — Telephone Encounter (Signed)
Pt's daughter says the pt has been complaining of left-sided neck pain x 2-3 months and now she is having ringing in her left ear. She denies any pain or discomfort in the ear. She has been using Tinnitus Relief OTC for a few days and this does give some short term relief. She has been using topical Bio-freeze for the neck pain. Pls advise. Allergies  Allergen Reactions  . Ciprofloxacin     REACTION: hallucinations
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
## 2011-04-03 NOTE — H&P (Signed)
[name], [name]             ACCOUNT NO.:  [id number]   MEDICAL RECORD NO.:  [id number]          PATIENT TYPE:  INP   LOCATION:  1222                         FACILITY:  [facility]   PHYSICIAN:  Kalman Shan, MD   DATE OF BIRTH:  1923-05-06   DATE OF ADMISSION:  11/24/2007  DATE OF DISCHARGE:                              HISTORY & PHYSICAL   TIME OF EVALUATION:  From 2315 to 2359 on November 24, 2007.  That is 44  minutes critical care time.   CHIEF COMPLAINT:  Fever, nausea, vomiting, low white count, abnormal UA.   HISTORY OF PRESENT ILLNESS:  This is an 77 year old female brought into  the emergency room by her daughter.  At baseline this is a functional 24-  year-old woman except for low backaches, severe spinal stenosis,  hypertension, and anemia.  Around 10 weeks ago she was mowing her lawn  and had severe back pain and since then has had decreased mobility and  functional status.  This is despite being evaluated by Dr. Ezzard Standing and  having 2 cortisone injections in both of the hips.  Daughter reports  that the mobility has diminished to the extent that she has been  increasingly bed bound and has needed help with urinary pan to pass  urine.  However these past few days she has improved and she has had  limited mobility at least between rooms.   On November 24, 2007 she had lunch which included scrambled eggs which she  says she boiled pretty well but 6 hours later she started having nausea  and vomiting, the onset was acute.  Later she also developed some vague  diffuse abdominal pain and was unable to urinate or defecate despite the  urge to do so.  Last bowel movement was yesterday and was normal.  At  presentation she denied fever, back pain, cough, dyspnea or other  complaints.  There is no report of any diarrhea as well.  No sick  contacts.   On arrival into the emergency room, first vital signs at 6:12 p.m. were  temperature 102.6, blood pressure 164/78, pulse of 112,  respiratory rate  of 20, saturation 93% on room air.  Glasgow coma scale of 15.  The  patient was evaluated by emergency room physicians.  Laboratory workup  showed an abnormal UA with white count 11-20 and many bacteria.  In  addition, white count was low at 1.1.  Otherwise bicarb was 26 and  normal, creatinine was 1.23, baseline creatinine unknown.  Albumin was  3.5.  diagnostics of urinary tract infection, uncomplicated, was made  and the patient was administered ciprofloxacin IV 400 mg at around 2220.  In addition she also got 1 liter saline bolus.  Additionally because of  her pain she was given some Dilaudid after which she desaturated and  became a little bit somnolent.  This has been reversed with Narcan and  subsequently ED notes report that the patient reverted back to near  normal mental status and respiratory status.   PAST MEDICAL HISTORY:  1. Iron deficiency anemia not otherwise specified.  According to the  daughter some 2-3 years ago she received iron infusions in the      hospital and she has been told by Dr. Kriste Basque that hemoglobin levels      are low normal but okay and is to be followed expectantly with      oral supplementation of iron.  2. Low back pain, she has severe spinal stenosis, unable to walk.  She      was recently evaluated on October 28, 2007 by Rubye Oaks, our      nurse practitioner, and was recommended to follow with Dr. Newell Coral      who began physical therapy.  3. Borderline diabetes mellitus.  This is mentioned in the chart but      the daughter denies this.  4. Hyperlipidemia, mentioned in chart but daughter denies.  5. Diverticulosis, mentioned in chart but daughter denies.  6. Right bundle branch block, mentioned in chart but daughter denies.  7. Hypertension on Norvasc and hydrochlorothiazide.   PAST SURGICAL HISTORY:  Right breast cyst removal over 45 years ago.   SOCIAL HISTORY:  She lives alone.  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  Her main hobby right now is to raise flowers and do  pottery with flowers.  She goes to the farmers market regularly.  She  apparently potted as much as 100 flowers recently.  Her DPOA is her  daughter, I am not sure if there is a living will.  She has never abused  cigarettes or alcohol.   REVIEW OF SYSTEMS:  At baseline, over 10 weeks ago she was completely  functional but for these past 10 weeks after suffering back pain mowing  her lawn she has been more been bed bound.  She currently needs a lot of  help to even transfer and do ADLs.  When walking down the stairs she has  to stop 2 times and she can make it from the bedroom to the bathroom  with a lot of struggle.  Her memory is otherwise intact.  The rest of  the review of systems as per history of present illness.   ALLERGIES:  No known allergies. Stopped cod liver oil capsules due to  belching.   FAMILY HISTORY:  Noncontributory.   MEDICATION LIST:  1. Norvasc 10 mg daily.  2. Hydrochlorothiazide 12.5 mg daily.  3. Advair Diskus 100/50 two puffs b.i.d.  4. Cod liver oil 1 capsule daily but recently discontinued on advise      of Tammy Parrett.  5. Iron supplement 1 tablet daily.  6. Albuterol 2 puffs as needed.   PHYSICAL EXAMINATION:  VITAL SIGNS:  At the time of my evaluation at  2214 included blood pressure of 106/58, pulse of 103, respiratory rate  of 20, saturation of 95% on 2 liters.  Glasgow coma scale of 15.  Vital  signs at 23:55 was blood pressure 96/51, pulse of 95, respiratory rate  of 14, saturation 95% on 2 liters, temperature of 98.8.  Repeat blood  pressure was also low at 100/45.  CENTRAL NERVOUS SYSTEM:  Alert and oriented times 3, a little bit sleepy  but easily arousable.  Moves all four extremities.  Speech is normal.  Was able to give a history.  RESPIRATORY:  Central air entry equal.  Clear to auscultation   bilaterally.  CARDIOVASCULAR:  Normal heart sounds, regular rate and rhythm.  No  murmurs.  ABDOMEN:  Soft, normal bowel sounds, probably hyperactive, no  organomegaly.  EXTREMITIES:  No cyanosis, no clubbing, no edema.  No unilateral  swelling.  BACK:  No costophrenic tenderness.  No spinal deformities.  SKIN:  Intact.  MUSCULOSKELETAL:  No obvious joint fractures that are acute.   LABORATORY EVALUATION:  1. CBC:  White count 1.1, hemoglobin 14.7, platelets of 139.  2. Lipase 28.  3. Chemistries:  Sodium 138, potassium 3.7, chloride 103, carbon      dioxide 26, glucose 178, BUN 17, creatinine 1.2, calcium 10.1.  4. Total protein 6.6, albumin 3.5, AST 22, ALT 19, alkaline      phosphatase 68, bilirubin 1.2.  5. Blood culture and sensitivity not performed in the ER.  6. Urinalysis shows WBC 11-20, bacteria many, urine specific gravity      1.011, urine ketones trace but urine nitrite was negative and urine      leukocyte esterase was small.  7. Chest x-ray was cleat without any evidence of any infiltrates.   ASSESSMENT:  This is an 77 year old woman who has been less ambulant and  more bedridden requiring a lot of help with mobility including passing  urine for the last 10 weeks.  Today after eating some eggs she acutely  developed symptoms of nausea and vomiting along with abdominal pain that  fit in with food poisoning.  She was febrile and tachycardic at  admission along with a low white count.  Features of SIRS syndrome  present.  Other analysis showed abnormal urinalysis but a normal  creatinine, normal bicarb, and normal LFTs.  Etiology for SIRS syndrome  is probably urinary tract infection or possible food poisoning.  Course  in the ER has been characterized by 1 liter IV fluids and 1 dose of IV  ciprofloxacin.  In addition she did get some Dilaudid for pain which  made her more somnolent and suppressed her respiratory status but this  was reversed with Narcan.  Anyway at  this point in time, that is not an  issue.  The main issue that in the time that she has been in the ER  since 6:00 p.m. she has become mildly hypotensive.  Therefore I suspect  some early sepsis is present.   PLAN:  1. Admit to Intensive Care Unit, stepdown status - vital signs every      15 minutes.  2. Continue IV quinolones every 24 hours.  3. Normal saline 1 more liter bolus for resuscitation and then      reassess  4. Daughter warned about complications of septic shock, aspiration      pneumonia, for which the patient is at high risk.  5. Rule out food poisoning with blood cultures and stool analysis.  6. Repeat all labs once in the Intensive Care Unit.  7. If blood pressure drops despite fluids will place a central line      and consider early sepsis goal directed protocol.  8. If fever persists then definitely needs renal ultrasound or CT scan      of the abdomen.  9. Continue quinolones for at least 48 hours until she is afebrile      through the IV form.  She likely will need 2 weeks of oral      antibiotics.  10.Will need evaluation by Physical Therapy about mobility because      this seems the precipitating event for the urinary tract infection.  11.Daughter updated at bedside about SIRS  physiology and risk for      sepsis, septic shock, aspiration pneumonia, need to be NPO, risk of      fluid overload with IVF.   IMPORTANT KEY NOTE:  Of note, the daughter of the patient, Mychael Soots, was upset at me, Dr. Kalman Shan.  When I walked into the  evaluation chamber approximately at approximately 2325 on November 24, 2007 she refused to shake my hand and she looked pretty upset at me.  I  asked her what was wrong and she declined to answer.  She said she will  I will take it up with Dr. Kriste Basque about you.  I then took her back to  the conversation I had earlier in the evening when I had responded to  her page through the answering service.  She had 'the on call paged  at  5:30 p.m.  I answered the page and at that time I had explained to her  that even though I was the person on call the person carrying the call  pager was someone different and I would have them call her back shortly.  I then left a message with Dr. Billy Fischer who was in E-link.  Around  10 minutes  later there was another page.  At this time I realized that  Dr. [name] Amabile had not gotten back to the patient therefore I decided to  talk to the patient myself.  She reiterated that she was already in the  emergency room for 30 minutes and no one had evaluated her although she  acknowledged that she had been triaged. At that very moment I overheard  a female nurse telling her that a doctor would see her mom next.  I then  explained the emergency room procedure for evaluation and explained to  her that she will be evaluated by the emergency room doctors and if  admission was required and if was a medical condition they would indeed  call me and I would come and then admit the patient.  She seemed upset  at that and immediately hung up the phone.   In reviewing the emergency room charts I noticed that she arrived at  triage at 5:38 p.m., was seen at 6:26 p.m. by Tenny Craw, the physician  assistant, was in the waiting room at 6:30 and back in the exam room at  7:19.  PCCM was paged at 2245.  Dr. Craige Cotta then relayed this information  to me at 2250 and I came into the emergency room at 2315 and 5-10  minutes later I was in the exam room to evaluate the patient.  I spent  the first 5-10 minutes picking up all information about her from the  chart and from the ER doctor.  I explained to the patient's daughter  that I had responded at every stage in a timely fashion. She did not  want to discuss anything any further and again reiterated that she will  take it up with Dr. Kriste Basque.  She also mentioned that apart from the  long wait of 2 hours in the emergency room everyone else in the  emergency room had  been wonderful.   44 minutes of critical care time spent in evaluating the patient.      Kalman Shan, MD  Electronically Signed     MR/MEDQ  D:  11/25/2007  T:  11/25/2007  Job:  [id number]

## 2011-04-03 NOTE — H&P (Signed)
[name], [name]             ACCOUNT NO.:  [id number]   MEDICAL RECORD NO.:  [id number]          PATIENT TYPE:  EMS   LOCATION:  ED                           FACILITY:  [facility]   PHYSICIAN:  Marlowe Kays, M.D.  DATE OF BIRTH:  Apr 06, 1923   DATE OF ADMISSION:  09/13/2008  DATE OF DISCHARGE:                              HISTORY & PHYSICAL   CHIEF COMPLAINT:  Pain in my right hip and leg.   PRESENT ILLNESS:  This 77 year old white female who was pushing a  rolling garbage can down her driveway this past Tuesday.  The can began  getting away from her and she turned loose the of the can and twisted  her right foot, fell on her right knee.  She had pain into the foot area  with some swelling in the knee as well and some into the hip.  She could  continue walking and did so for a period of time until today when she  began having marked increase in her pain and discomfort to the point  where she could not walk.  Her daughter summoned an ambulance.  She was  brought to the emergency room today where she had x-rays.  There was no  fracture of the foot, no fracture of the knee, but there was a displaced  femoral neck fracture of the right hip.  We were called to consult as  the family has been treated by our practice in the past.  Dr. Alroy Dust is her doctor.  We arrived at the emergency room, reviewed the x-  rays, and indeed confirm the diagnosis.   The patient had no other injuries, no loss of consciousness.  She does  have some severe spinal stenosis, being seen by Dr. Newell Coral, but she  did not feel that this was a causative agent of her fall.   PAST MEDICAL HISTORY:  The patient has been in relatively good health  throughout her lifetime.  She has only had 1 or 2 hospitalizations, the  last one being which sounds like by the daughter's and her description  as a severe urinary tract infection with uricemia.  She has a history of  anemia, as well as spinal stenosis.  She has had  no previous surgeries.   CURRENT MEDICATIONS:  1. Hydrochlorothiazide 12.5 mg daily.  2. Norvasc 1/2 tab daily.  3. Advair Diskus p.r.n.  4. Celebrex 200 mg daily.   SOCIAL HISTORY:  She lives alone.  She is extremely independent.  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  No intake of tobacco or alcohol products.   FAMILY HISTORY:  Noncontributory.   NO MEDICAL ALLERGIES.   REVIEW OF SYSTEMS:  CNS:  No seizures or paralysis, numbness, double  vision.  The patient does have pain mainly when standing and walking  from her stenosis.  Seated she is quite comfortable.  CARDIOVASCULAR:  No chest pain.  No angina.  No orthopnea.  RESPIRATORY:  No productive  cough.  No hemoptysis.  No shortness of breath.  Occasionally mild  wheezing.  GASTROINTESTINAL:  No nausea or vomiting.  No bloody stool.  GENITOURINARY:  No discharge, dysuria, or hematuria.  MUSCULOSKELETAL:  Primarily in present illness.   PHYSICAL EXAMINATION:  Alert and cooperative, friendly, fully alert and  oriented, 77 year old white female who is accompanied by her daughter.  Temperature is 97.9.  Pulse 77, regular.  Respirations 8, unlabored.  Blood pressure 171/70.  HEENT:  Normocephalic.  PERRLA.  EOMs intact.  Oropharynx is clear.  CHEST:  Clear to auscultation with the patient supine.  No rhonchi.  No  rales.  HEART:  Regular rate and rhythm.  No murmurs are heard.  ABDOMEN:  Soft, nontender.  Bowel sounds present.  GENITALIA/RECTAL/PELVIC/BREASTS:  Not done, not pertinent to present  illness, but she does have a Foley in place on stretcher.  EXAMINATION OF RIGHT LOWER EXTREMITY:  Reveals good pulses, sensation to  the foot, some mild swelling about the knee and foot.  The hip obviously  is not ranged.  No abrasions seen.   ADMISSION DIAGNOSIS:  1. Displaced femoral neck fracture of the right hip.  2. Hypertension.  3. Spinal stenosis.  4. History of anemia.    PLAN:  The patient will undergo cemented hemiarthroplasty of the right  hip.  Dr. Simonne Come explained the risks and benefits of the surgery to  them.  In all probability, she will need some skilled care after regular  hospitalization.  We will certainly ask for consults.  We will also keep  Dr. Kriste Basque aware of the situation and ask him to follow along with [place]  during the patient's hospitalization.      [name].    ______________________________  Marlowe Kays, M.D.    DLU/MEDQ  D:  09/13/2008  T:  09/13/2008  Job:  [id number]   cc:   Lonzo Cloud. Kriste Basque, MD  [address]  [place]  [place] [address]

## 2011-04-03 NOTE — Op Note (Signed)
NAMECOLUMBIA, [name]             ACCOUNT NO.:  [id number]   MEDICAL RECORD NO.:  [id number]          PATIENT TYPE:  INP   LOCATION:  2130                         FACILITY:  [facility]   PHYSICIAN:  Marlowe Kays, M.D.  DATE OF BIRTH:  1923-09-01   DATE OF PROCEDURE:  09/13/2008  DATE OF DISCHARGE:                               OPERATIVE REPORT   PREOPERATIVE DIAGNOSIS:  Closed displaced right femoral neck fracture.   POSTOPERATIVE DIAGNOSIS:  Closed displaced right femoral neck fracture.   OPERATION:  Cemented bipolar hemiarthroplasty right hip.   SURGEON:  Marlowe Kays, M.D.   ASSISTANT:  Mr. Idolina Primer, [place].   ANESTHESIA:  General.   PATHOLOGY AND JUSTIFICATION OF PROCEDURE:  This is a very active 77-year-  old with a displaced femoral neck fracture.  It was felt this was the  procedure of choice.  The potential risks and complications were  thoroughly discussed with the family prior to the surgery.   DESCRIPTION OF PROCEDURE:  Prophylactic antibiotics, satisfactory  general anesthesia, left lateral decubitus position on the Mark II  frame, the right hip was prepped and draped in a sterile field.  A  timeout performed.  She had, had prophylactic antibiotics.  Ioban  employed.  A posterolateral down to the fascia lata.  External rotators  were detached from the femur.  The hip capsule identified and opened in  a T fashion with the flaps tagged with #1 Vicryl.  The femoral head was  removed and we measured it at 52 mm.  Through the piriformis fossa of  soft tissue, I placed a guidepin down it, over reaming it and then using  a canal finder.  Following this, I used vertical reamers, as well as a  trochanteric reamer, working up to a 5 and along the way made my  definitive femoral neck cut.  I then began the rasping process.  The 5  was too loose, but I was unable to ream above a 5, so I went ahead a 6  broach and this fit nicely.  This left no cancellous bone on the calcar.  I then measured the canal at a size 2, and placed the cement plug.  I  then went back and checked the acetabulum size with the 52 mm trial head  and it had a very nice fit.  Accordingly, we went ahead and water-picked  the canal, and I then packed it with adrenaline soaked sponges and the  methacrylate was mixed.  The canal was dried and methacrylate inserted  with a glue gun in pressurized technique followed by using a 127 degree  size 6 femoral component.  We trimmed the excess amounts of methyl  methacrylate while it was hardening.  I then removed the sponge from the  acetabulum, and we sized for the femoral neck length and a +0 seemed to  be the best fit.  Accordingly, we went ahead and placed the final +0  ball on the trunnion and then followed this with the 52 mm bipolar head.  We reduced the hip which was nice and stable.  We previously just before  reduction had checked the acetabulum once again for any fragments.  There appeared be a nice stable fit.  I reapproximated the remnant of  the capsule with interrupted #1 Vicryl.  We then reapproximated the  external rotators with #1 Vicryl and the fascia lata with the same.  The  subcutaneous tissue was closed with 2-0 Vicryl, staples in  skin.  She was gently placed on her back.  Betadine Adaptic dry sterile  dressing were applied to the wound and placed in a knee immobilizer.  She tolerated the procedure well and was taken to the recovery room in  satisfactory condition with no known complications.  Estimated blood  loss was 600 mL, no blood replacement.           ______________________________  Marlowe Kays, M.D.     JA/MEDQ  D:  09/13/2008  T:  09/14/2008  Job:  [id number]

## 2011-04-03 NOTE — Discharge Summary (Signed)
[name], [name]             ACCOUNT NO.:  [id number]   MEDICAL RECORD NO.:  [id number]          PATIENT TYPE:  INP   LOCATION:  0160                         FACILITY:  [facility]   PHYSICIAN:  Lonzo Cloud. Kriste Basque, MD     DATE OF BIRTH:  08-Jun-1923   DATE OF ADMISSION:  11/24/2007  DATE OF DISCHARGE:  11/28/2007                               DISCHARGE SUMMARY   FINAL DIAGNOSES:  1. Admitted November 24, 2007 by Dr. Marchelle Gearing with urosepsis.  Final      urine and blood culture results showed no growth, however. The      patient responded to Cipro IV and p.o.  2. Hypotension responding to IV fluid resuscitation.  Anemia      compatible with anemia of chronic disease.  History of      hyperlipidemia. History of hypertension.  History of      diverticulosis.   BRIEF HISTORY AND PHYSICAL:  The patient is a an 84-year white female  brought to emergency room by her family with nausea, vomiting, and  abdominal discomfort.  She has had a history of low back pain and has  severe spinal stenosis.  She denied fevers, chills or sweats.  In the  ER, however, her temperature was 102.6 degrees, and evaluation revealed  a urinary tract infection with sepsis.  She was admitted to the ICU for  fluid resuscitation and IV antibiotics.   PAST MEDICAL HISTORY:  As noted, she has history of hypertension, right  bundle branch block, and hyperlipidemia.  She has history of borderline  diabetes in the past.  She has a history of anemia and had received some  iron infusions, previously.  She has a history of severe low back pain  with known spinal stenosis and difficulty with ambulation.   PHYSICAL EXAMINATION:  GENERAL:  Examination at time of admission  revealed elderly white female in mild distress.  VITAL SIGNS:  Blood pressure 106/50, pulse 103 regular, respirations 20  per minute on 2 liters on O2 sat of 95%.  Temperature 102 in the ER.  HEENT:  Exam negative, except for dry mucous membranes.  Chest  revealed  clear lungs without wheezes, rales or rhonchi heard.  HEART:  Revealed  regular rhythm, grade 1/6 systolic ejection murmur, left sternal border,  without rubs or gallops heard.  ABDOMEN:  Soft and nontender without evidence organomegaly or masses.  CVA areas were nontender.  EXTREMITIES:  Showed some venous insufficiency.  No cyanosis, clubbing  or edema.  NEUROLOGIC:  Exam revealed some decreased reflexes in the lower  extremities, and some decreased range of motion, otherwise  negative.   EKG showed sinus rhythm with first-degree AV block.  His right bundle  branch block, no ST-T wave changes.  Abdominal films showed dilated  loops of small bowel but no acute abnormalities.  There was a moderate  amount of stool in the colon.  Abdominal ultrasound showed no  gallstones, slightly inhomogeneous liver.  Chest x-ray showed no acute  infiltrates.  CT of the abdomen showed no acute abnormalities the  neither area.  Blood work showed  a hemoglobin of 14, hematocrit 41,  white count 11,100 with 85% segs.  Pro-time 16.9, INR 1.3, PTT 32  seconds.  Sodium 138, potassium 3.7, chloride 103, CO2 26, BUN 17,  creatinine 1.2, blood sugar 178.  Calcium 10.1.  Total protein 6.6,  albumin 3.5, AST 22, ALT 19, alk phos 68, total bilirubin 1.0, magnesium  1.5, phosphorus 3.1, amylase 52, lipase 28, CPK 50, negative MB and  negative troponin, BNP 207.  Urine showed too-numerous-to-count white  cells and bacteria.  Cultures were no growth.   HOSPITAL COURSE:  The patient was admitted by Dr. Marchelle Gearing, in my  absence.  He put her in intensive care unit for fluid resuscitation and  monitoring.  She was started on IV Cipro.  She rapidly improved on the  antibiotics.  Temperature responded, and blood pressure improved.  She  was transferred to the ward.  She was able to resume her diet and  ambulate without difficulty.  She felt much better and asked  for early  discharge and was sent home on  November 28, 2007.   MEDICATIONS AT DISCHARGE:  1. Cipro 5 mg p.o. b.i.d. till gone.  2. Norvasc 10 mg p.o. daily.  3. HCT 12.5 mg p.o. daily.  4. Advair 100/50 one puff b.i.d.  5. Albuterol MDI as needed.  6. Iron tablets one a day.   CONDITION ON DISCHARGE:  Improved.   DISPOSITION:  The patient has been discharged home.  Will call the  office for followup appointment in two weeks.      Lonzo Cloud. Kriste Basque, MD  Electronically Signed     SMN/MEDQ  D:  01/08/2008  T:  01/09/2008  Job:  [id number]   cc:   Lonzo Cloud. Kriste Basque, MD  [address]  [place]  [place] [address]   Patient Chart

## 2011-05-01 ENCOUNTER — Telehealth: Payer: Self-pay | Admitting: Pulmonary Disease

## 2011-05-01 NOTE — Telephone Encounter (Signed)
Called and spoke with pt's daughter, shirley.  Talbert Forest states pt started with a sore throat last Thursday (which is now gone) but is now coughing up yellow sputum.  Denies a fever, wheezing, tightness in chest or increased sob.  Pt is 1 extra strength tylenol as needed and was taking Chloraseptic for the sore throat.  Daughter is requesting an appt.  Pt scheduled to see TP tomorrow at 11:15 am.

## 2011-05-02 ENCOUNTER — Encounter: Payer: Self-pay | Admitting: Adult Health

## 2011-05-02 ENCOUNTER — Ambulatory Visit (INDEPENDENT_AMBULATORY_CARE_PROVIDER_SITE_OTHER)
Admission: RE | Admit: 2011-05-02 | Discharge: 2011-05-02 | Disposition: A | Discharge status: Home / Self Care | Payer: Medicare Other | Source: Ambulatory Visit | Attending: Adult Health | Admitting: Adult Health

## 2011-05-02 ENCOUNTER — Ambulatory Visit (INDEPENDENT_AMBULATORY_CARE_PROVIDER_SITE_OTHER): Payer: Medicare Other | Admitting: Adult Health

## 2011-05-02 VITALS — HR 100 | Temp 97.0°F | Ht 68.0 in | Wt 177.6 lb

## 2011-05-02 DIAGNOSIS — J209 Acute bronchitis, unspecified: Secondary | ICD-10-CM

## 2011-05-02 MED ORDER — AMOXICILLIN-POT CLAVULANATE 875-125 MG PO TABS: 1.0000 | ORAL_TABLET | Freq: Two times a day (BID) | ORAL | Status: AC

## 2011-05-02 NOTE — Progress Notes (Signed)
  Subjective:    Patient ID: [name], female    DOB: [birth year], 77 y.o.   MRN: [id number]  HPI 77 yo female with known hx of HTN, Hyperlipidemia, DJD  05/02/11 Acute OV  Complains of increased SOB, wheezing, prod cough with yellow mucus, sore throat/hoarseness, occ sweats  x4days otc some helps but not getting better. Cough is getting worse with thick yellow mucus.  Cough is barky at times., keeps her up at night. Wearing her out.     Review of Systems Constitutional:   No  weight loss, night sweats,  Fevers, chills,  +fatigue, or  lassitude.  HEENT:   No headaches,  Difficulty swallowing,  Tooth/dental problems, or  Sore throat,                No sneezing, itching, ear ache, nasal congestion, post nasal drip,   CV:  No chest pain,  Orthopnea, PND, swelling in lower extremities, anasarca, dizziness, palpitations, syncope.   GI  No heartburn, indigestion, abdominal pain, nausea, vomiting, diarrhea, change in bowel habits, loss of appetite, bloody stools.   Resp:  No coughing up of blood.   .  No wheezing.  No chest wall deformity  Skin: no rash or lesions.  GU: no dysuria, change in color of urine, no urgency or frequency.  No flank pain, no hematuria   MS:  No joint pain or swelling.  No decreased range of motion.     Psych:  No change in mood or affect. No depression or anxiety.           Objective:   Physical Exam GEN: A/Ox3; pleasant , NAD, edlerly overweight   HEENT:  [place]/AT,  EACs-clear, TMs-wnl, NOSE-clear, THROAT-clear, no lesions, no postnasal drip or exudate noted.   NECK:  Supple w/ fair ROM; no JVD; normal carotid impulses w/o bruits; no thyromegaly or nodules palpated; no lymphadenopathy.  RESP  Coarse BS   w/o, wheezes/ rales/ or rhonchi.no accessory muscle use, no dullness to percussion  CARD:  RRR, no m/r/g  , no peripheral edema, pulses intact, no cyanosis or clubbing.  GI:   Soft & nt; nml bowel sounds; no organomegaly or masses  detected.  Musco: Warm bil, no deformities or joint swelling noted.   Neuro: alert, no focal deficits noted.    Skin: Warm, no lesions or rashes        Assessment & Plan:

## 2011-05-02 NOTE — Assessment & Plan Note (Addendum)
Acute Flare  Xray pending  Plan :  Augmentin 875mg  Twice daily  For 7 days with food Mucinex DM Twice daily  As needed  Cough/congestion  Fluids and rest.  Please contact office for sooner follow up if symptoms do not improve or worsen or seek emergency care   follow up Dr. Kriste Basque  In 2 weeks as planned and As needed

## 2011-05-02 NOTE — Patient Instructions (Addendum)
Augmentin 875mg  Twice daily  For 7 days with food Mucinex DM Twice daily  As needed  Cough/congestion  Fluids and rest.  Please contact office for sooner follow up if symptoms do not improve or worsen or seek emergency care   follow up Dr. Kriste Basque  In 2 weeks as planned and As needed   Restart Advair 100/50 1 puffs Twice daily

## 2011-05-11 ENCOUNTER — Encounter: Payer: Self-pay | Admitting: Pulmonary Disease

## 2011-05-14 ENCOUNTER — Encounter: Payer: Self-pay | Admitting: Pulmonary Disease

## 2011-05-14 ENCOUNTER — Other Ambulatory Visit: Payer: Self-pay | Admitting: Pulmonary Disease

## 2011-05-14 ENCOUNTER — Other Ambulatory Visit (INDEPENDENT_AMBULATORY_CARE_PROVIDER_SITE_OTHER): Payer: Medicare Other

## 2011-05-14 ENCOUNTER — Ambulatory Visit (INDEPENDENT_AMBULATORY_CARE_PROVIDER_SITE_OTHER): Payer: Medicare Other | Admitting: Pulmonary Disease

## 2011-05-14 DIAGNOSIS — I4510 Unspecified right bundle-branch block: Secondary | ICD-10-CM

## 2011-05-14 DIAGNOSIS — R7309 Other abnormal glucose: Secondary | ICD-10-CM

## 2011-05-14 DIAGNOSIS — K5730 Diverticulosis of large intestine without perforation or abscess without bleeding: Secondary | ICD-10-CM

## 2011-05-14 DIAGNOSIS — Z23 Encounter for immunization: Secondary | ICD-10-CM

## 2011-05-14 DIAGNOSIS — D649 Anemia, unspecified: Secondary | ICD-10-CM

## 2011-05-14 DIAGNOSIS — M1990 Unspecified osteoarthritis, unspecified site: Secondary | ICD-10-CM

## 2011-05-14 DIAGNOSIS — F411 Generalized anxiety disorder: Secondary | ICD-10-CM

## 2011-05-14 DIAGNOSIS — F419 Anxiety disorder, unspecified: Secondary | ICD-10-CM

## 2011-05-14 DIAGNOSIS — I10 Essential (primary) hypertension: Secondary | ICD-10-CM

## 2011-05-14 DIAGNOSIS — E785 Hyperlipidemia, unspecified: Secondary | ICD-10-CM

## 2011-05-14 DIAGNOSIS — R0602 Shortness of breath: Secondary | ICD-10-CM

## 2011-05-14 DIAGNOSIS — M545 Low back pain: Secondary | ICD-10-CM

## 2011-05-14 LAB — LIPID PANEL
Cholesterol: 218 mg/dL — ABNORMAL HIGH (ref 0–200)
HDL: 65.8 mg/dL
Total CHOL/HDL Ratio: 3
Triglycerides: 50 mg/dL (ref 0.0–149.0)
VLDL: 10 mg/dL (ref 0.0–40.0)

## 2011-05-14 LAB — HEPATIC FUNCTION PANEL
ALT: 17 U/L (ref 0–35)
AST: 24 U/L (ref 0–37)
Albumin: 4.4 g/dL (ref 3.5–5.2)
Alkaline Phosphatase: 84 U/L (ref 39–117)
Bilirubin, Direct: 0.1 mg/dL (ref 0.0–0.3)
Total Bilirubin: 0.7 mg/dL (ref 0.3–1.2)
Total Protein: 7.7 g/dL (ref 6.0–8.3)

## 2011-05-14 LAB — BASIC METABOLIC PANEL WITH GFR
BUN: 16 mg/dL (ref 6–23)
CO2: 33 meq/L — ABNORMAL HIGH (ref 19–32)
Calcium: 9.7 mg/dL (ref 8.4–10.5)
Chloride: 96 meq/L (ref 96–112)
Creatinine, Ser: 0.9 mg/dL (ref 0.4–1.2)
GFR: 59.97 mL/min — ABNORMAL LOW
Glucose, Bld: 93 mg/dL (ref 70–99)
Potassium: 3.5 meq/L (ref 3.5–5.1)
Sodium: 138 meq/L (ref 135–145)

## 2011-05-14 LAB — VITAMIN D 25 HYDROXY (VIT D DEFICIENCY, FRACTURES): Vit D, 25-Hydroxy: 40 ng/mL (ref 30–89)

## 2011-05-14 LAB — CBC WITH DIFFERENTIAL/PLATELET
Basophils Relative: 0.6 % (ref 0.0–3.0)
Eosinophils Relative: 1.6 % (ref 0.0–5.0)
HCT: 44.5 % (ref 36.0–46.0)
Lymphs Abs: 1.5 10*3/uL (ref 0.7–4.0)
MCV: 89.6 fl (ref 78.0–100.0)
Monocytes Absolute: 0.3 10*3/uL (ref 0.1–1.0)
Monocytes Relative: 7.4 % (ref 3.0–12.0)
RBC: 4.96 Mil/uL (ref 3.87–5.11)
WBC: 4.6 10*3/uL (ref 4.5–10.5)

## 2011-05-14 LAB — HEMOGLOBIN A1C: Hgb A1c MFr Bld: 6.6 % — ABNORMAL HIGH (ref 4.6–6.5)

## 2011-05-14 LAB — LDL CHOLESTEROL, DIRECT: Direct LDL: 129 mg/dL

## 2011-05-14 MED ORDER — CLONAZEPAM 0.5 MG PO TABS: ORAL_TABLET | ORAL | Status: DC

## 2011-05-14 NOTE — Progress Notes (Signed)
Subjective:    Patient ID: [name], female    DOB: [birth year], 77 y.o.   MRN: [id number]  HPI 77 y/o WF here for a follow up visit... she has multiple medical problems as noted below...  Followed for general medical purposes w/ hx chr obstructive asthma, HBP, RBBB, Hypercholesterolemia, borderline DM, DJD, LBP w/ sp stenosis, etc...  ~  December 12, 2009:  good 75mo- only c/o arthritis- esp legs & neck...  given Lyrica by DrAplington ?post shingles pain?... her dyspnea is improved;  BP controlled on meds;  Chol ? & sugar OK on diet alone (reminded that she needs to be fasting for FLP)...   ~  May 18, 2010:  she's had incr difficulty w/ her right hip hemiarthroplasty & DrOlin diagnosed a prob w/ the cement & loosening of the prosthesis- needs revision but she is reluctant...  BP controlled on meds;  no CP/ palpit/ etc;  otherw stable & we discussed once again returning for FASTING blood work...  ~  November 14, 2010:  she had right hip hemiarthroplasty converted to a right THR by DrAlusio 10/11 & now much improved- still getting Pt etc... she also notes that he prev dyspnea was due to constipation- now much improved w/ peppermint tea + Senakot/ Miralax, and sauerkraut "I listen to DrOz"... BP controlled & denies CP, palpit, dyspnea, etc;  labs from hosp & 12/11 reviewed... she does not want the Flu shot.  ~  May 14, 2011:  49mo ROV & she was seen 2wks ago by TP w/ acute bronchitis> CXR was clear, chr changes; given Augmentin & Mucinex- improved... She notes occas episodes of SOB "like I can't get a deep breath" & she improves if she sits outside (?claustrophobia); we discussed trial Klonopin 0.5mg  1/2 to 1 tab po bid regularly to treat this symptom... Also c/o ringing in ears & uses Biofreeze behind her ear for resolution;  C/o neck pain & has Celebrex to use as needed;  BP controlled on meds;  Bowels regular & doing satis;  Needs TDAP today & Fasting Labs>>  FLP fair on diet alone & she  doesn't want meds for this; Chems look good w/ BS=93 & A1c=6.6 also on diet alone & reminded of low carb, no sweets etc...   Problem List:  DYSPNEA (ICD-786.05) - long hx of chronic obstructive asthma treated w/ ADVAIR & PROAIR (she uses them Prn now)... she is a non-smoker w/ some reactive airways disease in the past & retired from [employer] after 33 years in 1991... she denies cough, sputum, hemoptysis, worsening dyspnea,  wheezing, chest pains, snoring, daytime hypersomnolence, etc...  ~  baseline CXR w/o acute changes...  ~  PFT's 5/02 w/ FVC 2.07 (68%), FEV1=1.36 (57%), and FEV1/FVC ratio=66%, mid-flows 42%... ~  CT Angio 7/10 was neg- x biapical pleuroparenchymal scarring... ~  CXR 10/11 showed sl elev right hemidaiph, mild DJD sp, osteopenia, NAD.[name]. ~  CXR 6/12 showed mild apical scarring, clear & NAD, DJD sp w/ osteophytes...  HYPERTENSION (ICD-401.9) - controlled on NORVASC 10mg tabs- 1/2 tab daily & HCTZ 25mg tab daily... BP 138/64 today... tol rx well & denies HA, visual changes, CP, palipit, dizziness, syncope, edema, etc...  RIGHT BUNDLE BRANCH BLOCK (ICD-426.4) - on ASA 81mg /d... baseline EKG w/ RBBB and 2DEcho 5/02 showed mild asymmetric LVH w/ incr EF...  HYPERLIPIDEMIA (ICD-272.4) - on diet alone... she forgets to come to visits FASTING for this blood work ~  FLP 8/07 showed TChol 205, TG 71, HDL 49, LDL  130... ~  FLP 6/12 on diet alone showed TChol 218, TG 50, HDL 66, LDL 129  DIABETES MELLITUS, BORDERLINE (ICD-790.29) - on diet alone w/ prev BS's in the 100-160 range... ~  labs in 2008-9 showed BS= 101 to 108 ~  labs 1/10 showed BS= 106, A1c= 5.9 ~  Labs 6/12 showed BS= 93, A1c= 6.6.Marland KitchenMarland Kitchen rec diet, exercise...  DIVERTICULOSIS OF COLON (ICD-562.10) - she takes SENAKOT-S, MIRALAX, Peppermint Tea, & sauerkraut Prn...last colonoscopy 9/02 by DrPerry was WNL...  PYELONEPHRITIS (ICD-590.80) - SEE 1/09 Hospitalization (reviewed)...  Hx of BREAST CYST (ICD-610.0)  DEGENERATIVE  JOINT DISEASE (ICD-715.90) - s/p right hip hemiarthroplasty 11/09 by DrAplington w/ wound complic... then dx w/ loosening of the femoral shaft & had conversion to right THR by DrAlusio 10/11 & much improved... she uses CELEBREX 200mg  Prn (seldom takes this).  LOW BACK PAIN SYNDROME (ICD-724.2) & SPINAL STENOSIS (ICD-724.00) - severe LBP & spinal stenosis w/ evals by DrRamos & DrNudelman... s/p shots, considering poss surgery vs alternative therapies... she takes Celebrex, Osteobiflex, MVI, Vit D... ~  8/10: eval by DrAplington- diff leg lengths, lift placed in right shoe, then trial Lyrica50mg ... ~  12/11:  improved after hip revision surg w/ better ambulaton...  Hx of ANEMIA (ICD-285.9) - eval by GI in 2002 showed normal EGD and Colon... prob iron malabsorption problem Rx'd w/ Fe infusion... ~  labs 1/10 showed Hg= 14.6, MCV= 89, Fe= 94 ~  labs 12/11 showed Hg= 12.8, MCV= 90, Fe= 33... try Fe supplement + VitC...  DERM:  rash Rx'd by dermatology- OLUX-E foam= clobetasol Foam 0.05%...   Past Surgical History  Procedure Date  . Cataract extraction   . Right hip hemiarthroplasty   . Conversion to right thr     Outpatient Encounter Prescriptions as of 05/14/2011  Medication Sig Dispense Refill  . albuterol (PROAIR HFA) 108 (90 BASE) MCG/ACT inhaler Inhale 2 puffs into the lungs 4 (four) times daily as needed.        [name] amLODipine (NORVASC) 10 MG tablet Take 10 mg by mouth daily.        . calcium carbonate (TUMS - DOSED IN MG ELEMENTAL CALCIUM) 500 MG chewable tablet Chew 1 tablet by mouth as needed.       . celecoxib (CELEBREX) 200 MG capsule Take 200 mg by mouth daily as needed.       . Cholecalciferol (VITAMIN D3) 400 UNITS CAPS Take 1 capsule by mouth daily.        . clobetasol (OLUX) 0.05 % topical foam Apply topically as needed.        . Ferrous Sulfate Dried (FEOSOL) 200 (65 FE) MG TABS Take 1 tablet by mouth 2 (two) times daily. With Vit C 500mg  with each dose       .  Fluticasone-Salmeterol (ADVAIR DISKUS) 100-50 MCG/DOSE AEPB Inhale 1 puff into the lungs every 12 (twelve) hours.        . hydrochlorothiazide 25 MG tablet Take 25 mg by mouth daily.        . Misc Natural Products (OSTEO BI-FLEX ADV DOUBLE ST PO) Take 1 tablet by mouth 2 (two) times daily.        . Multiple Vitamins-Minerals (CENTRUM SILVER PO) Take 1 capsule by mouth daily.        . polyethylene glycol (MIRALAX / GLYCOLAX) packet Take 17 g by mouth daily.          Allergies  Allergen Reactions  . Ciprofloxacin     REACTION:  hallucinations    Review of Systems         See HPI - all other systems neg except as noted... The patient complains of decreased hearing, dyspnea on exertion, muscle weakness, and difficulty walking.  The patient denies anorexia, fever, weight loss, weight gain, vision loss, hoarseness, chest pain, syncope, peripheral edema, prolonged cough, headaches, hemoptysis, abdominal pain, melena, hematochezia, severe indigestion/heartburn, hematuria, incontinence, suspicious skin lesions, transient blindness, depression, unusual weight change, abnormal bleeding, enlarged lymph nodes, and angioedema.     Objective:   Physical Exam     WD, WN, Chr ill appearing 77 y/o WF in NAD... GENERAL:  Alert & oriented; pleasant & cooperative... HEENT:  [place]/AT, EOM-full, EACs-clear, TMs-wnl, NOSE-clear, THROAT-clear & wnl. NECK:  Supple w/ fairROM; no JVD; normal carotid impulses w/o bruits; no thyromegaly or nodules palpated; no lymphadenopathy. CHEST:  Clear to P & A; without wheezes/ rales/ or rhonchi heard... HEART:  Regular Rhythm; without murmurs/ rubs/ or gallops detected... ABDOMEN:  Soft & nontender; normal bowel sounds; no organomegaly or masses palpated... EXT:  mod arthritic changes, walks w/ cane, +venous insuffic & tr edema., scattered varicose veins... NEURO:  CN's intact; motor testing normal; no focal deficits... DERM:   mild intertrig rash under breast, & onychomycosis  of toenails...   Assessment & Plan:   DYSPNEA>  Hx asthma, stable on Advair, Proair; hx anxiety component on Klonopin> continue Rx...  HBP>  Controlled on Norvasc, HCT, +diet etc...  RBBB>  Aware & denies CP, palpit, ch in DOE, etc...  CHOL>  On diet alone & does not want med Rx for this prob... We reviewed low chol, low fat diet...  DM>  A1c is 6.6 on diet alone & we reviewed low carb no sweets etc...  GI & GU>  Stable...  DJD, LBP, Spinal Stenosis>  Prev evals by Ortho, DrRamos, DrNudelman etc; improved after THR w/ better ambulation...  Anxiety>  The Klonopin Bid helps.Marland KitchenMarland Kitchen

## 2011-05-14 NOTE — Patient Instructions (Signed)
Today we updated your med list in EPIC...    We decided to add KLONOPIN 0.5mg - take 1/2 tab twice daily (& you may incr to 1 tab twice daily as needed)  Today we did your follow up fasting blood work...    Please call the PHONE TREE in a few days for your results...    Dial [id number] & when prompted enter your patient number followed by the # symbol...    Your patient number is:  [phone]  Call for any questions...  Let's plan another routine ROV in 6 months, sooner if needed for problems.Marland KitchenMarland Kitchen

## 2011-05-15 ENCOUNTER — Other Ambulatory Visit: Payer: Self-pay | Admitting: Pulmonary Disease

## 2011-05-16 ENCOUNTER — Telehealth: Payer: Self-pay | Admitting: Pulmonary Disease

## 2011-05-16 MED ORDER — CELECOXIB 200 MG PO CAPS: ORAL_CAPSULE | ORAL | Status: DC

## 2011-05-16 NOTE — Telephone Encounter (Signed)
Called and spoke with [name]---she stated that they tried to get her celebrex refilled but Dr. Earl Gala office would not fill it for her.  rx has been sent in for refills.

## 2011-05-20 ENCOUNTER — Encounter: Payer: Self-pay | Admitting: Pulmonary Disease

## 2011-06-18 ENCOUNTER — Telehealth: Payer: Self-pay | Admitting: Pulmonary Disease

## 2011-06-18 DIAGNOSIS — M542 Cervicalgia: Secondary | ICD-10-CM

## 2011-06-18 NOTE — Telephone Encounter (Signed)
Called and spoke with pts daughter---pt is c/o on lots of neck pain that radiates into her shoulder and in her neck.  Ringing in her ear and feels like ants are crawling all over her neck.  Denies any blurred vision or headaches.  SN please advise. thanks

## 2011-06-18 NOTE — Telephone Encounter (Signed)
Per SN: needs ortho eval for neck pain.  Called spoke with pt's daughter, advised of SN's recs as stated above.  Talbert Forest reports that pt is already est with Dr Despina Hick.  Order placed in epic, she is aware referral will not be made until tomorrow and is okay with this.

## 2011-07-24 ENCOUNTER — Telehealth: Payer: Self-pay | Admitting: Pulmonary Disease

## 2011-07-24 DIAGNOSIS — N390 Urinary tract infection, site not specified: Secondary | ICD-10-CM

## 2011-07-24 MED ORDER — SULFAMETHOXAZOLE-TRIMETHOPRIM 800-160 MG PO TABS: 1.0000 | ORAL_TABLET | Freq: Two times a day (BID) | ORAL | Status: AC

## 2011-07-24 NOTE — Telephone Encounter (Signed)
Pt's daughter stated pt is experiencing frequent urination.  [name]

## 2011-07-24 NOTE — Telephone Encounter (Signed)
Called, spoke with pt's daughter, [name].  States on Saturday pt having severe indigestion, belching, and frequent urination.  Pt also having weakness and increased SOB since Saturday.  Denies pain when urinating, foul odor but does state urine is slight darker than normal.  Also, having some lower back pain and pain in sides that started on Friday but pt is not sure if this is related to her pushing a flower pot on Friday.  Requesting to leave sample to see if pt has a UTI and requesting sample of proair if available.  Allergies verified -- Dr. Kriste Basque - pls advise if ok for pt to come in for urine culture.  Thanks!   Allergies  Allergen Reactions  . Ciprofloxacin     REACTION: hallucinations

## 2011-07-24 NOTE — Telephone Encounter (Signed)
Note: 1 sample of proair left at front for pick up -- pt will need to be notified of this when we call her back regarding SN's recs.

## 2011-07-24 NOTE — Telephone Encounter (Signed)
Per SN---ok for proair sample---ok to leave a UA to check---does she want a abx as well?  Daughter stated that she will bring her in the morning for the UA to be done and she did request that abx be sent to the pharmacy.   Per SN--septra ds   #14   1 po bid until gone.

## 2011-07-25 ENCOUNTER — Other Ambulatory Visit: Payer: Self-pay | Admitting: Pulmonary Disease

## 2011-07-25 ENCOUNTER — Other Ambulatory Visit: Payer: Medicare Other

## 2011-07-25 DIAGNOSIS — N390 Urinary tract infection, site not specified: Secondary | ICD-10-CM

## 2011-07-25 LAB — URINALYSIS, ROUTINE W REFLEX MICROSCOPIC
Bilirubin Urine: NEGATIVE
Hgb urine dipstick: NEGATIVE
Nitrite: NEGATIVE
Total Protein, Urine: NEGATIVE
pH: 5.5 (ref 5.0–8.0)

## 2011-07-31 ENCOUNTER — Telehealth: Payer: Self-pay | Admitting: Pulmonary Disease

## 2011-07-31 NOTE — Telephone Encounter (Signed)
Called and spoke with pt and her daughter and they are aware that per SN   Pt will need to take the septra ds 1 bid until gone.  This has already been sent to the pharmacy.

## 2011-07-31 NOTE — Telephone Encounter (Signed)
Spoke with pt's daughter. She states needs results of UA and culture done 07/24/11- she was waiting to hear results before picking up the septra that we called in for her. Please advise, thanks!

## 2011-07-31 NOTE — Telephone Encounter (Signed)
lmomtcb  

## 2011-08-08 LAB — BASIC METABOLIC PANEL
BUN: 13
BUN: 21
BUN: 26 — ABNORMAL HIGH
Calcium: 8.4
Calcium: 8.5
Chloride: 110
Creatinine, Ser: 1.01
Creatinine, Ser: 1.57 — ABNORMAL HIGH
Creatinine, Ser: 1.68 — ABNORMAL HIGH
GFR calc non Af Amer: 29 — ABNORMAL LOW
GFR calc non Af Amer: 32 — ABNORMAL LOW
Glucose, Bld: 108 — ABNORMAL HIGH
Glucose, Bld: 134 — ABNORMAL HIGH
Glucose, Bld: 99
Sodium: 141

## 2011-08-08 LAB — CBC
HCT: 41.7
Hemoglobin: 14.7
MCHC: 35.1
MCV: 89.2
Platelets: 139 — ABNORMAL LOW
Platelets: 97 — ABNORMAL LOW
Platelets: 99 — ABNORMAL LOW
RBC: 3.74 — ABNORMAL LOW
RBC: 3.89
RBC: 4.67
RDW: 13.7
WBC: 1.1 — CL
WBC: 11.1 — ABNORMAL HIGH
WBC: 17.1 — ABNORMAL HIGH

## 2011-08-08 LAB — CARDIAC PANEL(CRET KIN+CKTOT+MB+TROPI)
CK, MB: 7.4 — ABNORMAL HIGH
Relative Index: INVALID
Total CK: 333 — ABNORMAL HIGH
Total CK: 50
Troponin I: 0.05
Troponin I: 0.08 — ABNORMAL HIGH

## 2011-08-08 LAB — COMPREHENSIVE METABOLIC PANEL
ALT: 56 — ABNORMAL HIGH
AST: 44 — ABNORMAL HIGH
Albumin: 2.4 — ABNORMAL LOW
Albumin: 3.5
BUN: 17
CO2: 25
Calcium: 8.7
Chloride: 108
Creatinine, Ser: 1.23 — ABNORMAL HIGH
GFR calc Af Amer: 51 — ABNORMAL LOW
GFR calc Af Amer: 55 — ABNORMAL LOW
GFR calc non Af Amer: 46 — ABNORMAL LOW
Potassium: 3.7
Sodium: 139
Total Bilirubin: 1.1
Total Protein: 6.6

## 2011-08-08 LAB — HEPATIC FUNCTION PANEL
Albumin: 2.5 — ABNORMAL LOW
Alkaline Phosphatase: 57
Total Protein: 4.9 — ABNORMAL LOW

## 2011-08-08 LAB — URINALYSIS, ROUTINE W REFLEX MICROSCOPIC
Bilirubin Urine: NEGATIVE
Glucose, UA: NEGATIVE
Protein, ur: 100 — AB
pH: 5.5

## 2011-08-08 LAB — LIPASE, BLOOD
Lipase: 15
Lipase: 18

## 2011-08-08 LAB — DIFFERENTIAL
Basophils Relative: 0
Lymphocytes Relative: 15
Monocytes Relative: 0 — ABNORMAL LOW
Neutro Abs: 0.9 — ABNORMAL LOW

## 2011-08-08 LAB — CULTURE, BLOOD (ROUTINE X 2): Culture: NO GROWTH

## 2011-08-08 LAB — AMYLASE: Amylase: 44

## 2011-08-08 LAB — URINE MICROSCOPIC-ADD ON

## 2011-08-08 LAB — PROTIME-INR
INR: 1.3
Prothrombin Time: 16.9 — ABNORMAL HIGH

## 2011-08-20 LAB — PROTIME-INR
INR: 1.2
INR: 1.3
INR: 1.3
INR: 1.4
INR: 1.4
Prothrombin Time: 15.3 — ABNORMAL HIGH
Prothrombin Time: 16.2 — ABNORMAL HIGH
Prothrombin Time: 16.6 — ABNORMAL HIGH
Prothrombin Time: 17.5 — ABNORMAL HIGH
Prothrombin Time: 17.7 — ABNORMAL HIGH
Prothrombin Time: 18.1 — ABNORMAL HIGH

## 2011-08-20 LAB — BASIC METABOLIC PANEL
BUN: 12
CO2: 30
Calcium: 8.6
Creatinine, Ser: 0.69
Creatinine, Ser: 0.69
GFR calc Af Amer: >60
GFR calc Af Amer: >60
GFR calc non Af Amer: >60
GFR calc non Af Amer: >60
Glucose, Bld: 104 — ABNORMAL HIGH
Sodium: 139

## 2011-08-20 LAB — DIFFERENTIAL
Basophils Absolute: 0
Basophils Relative: 0
Eosinophils Absolute: 0.1
Eosinophils Relative: 1
Lymphs Abs: 1.4
Neutrophils Relative %: 77

## 2011-08-20 LAB — URINE MICROSCOPIC-ADD ON

## 2011-08-20 LAB — URINALYSIS, ROUTINE W REFLEX MICROSCOPIC
Glucose, UA: NEGATIVE
Protein, ur: NEGATIVE
Specific Gravity, Urine: 1.023

## 2011-08-20 LAB — CBC
HCT: 42.4
MCHC: 34
MCHC: 34.4
MCHC: 34.5
MCV: 89.7
Platelets: 148 — ABNORMAL LOW
Platelets: 197
RBC: 3.26 — ABNORMAL LOW
RBC: 3.5 — ABNORMAL LOW
RBC: 3.51 — ABNORMAL LOW
RDW: 12
RDW: 12.1
WBC: 11.3 — ABNORMAL HIGH
WBC: 8.8
WBC: 9.5

## 2011-08-20 LAB — COMPREHENSIVE METABOLIC PANEL
Albumin: 3.1 — ABNORMAL LOW
Alkaline Phosphatase: 62
BUN: 15
Calcium: 9.5
Creatinine, Ser: 0.67
Glucose, Bld: 114 — ABNORMAL HIGH
Total Protein: 6.3

## 2011-08-21 LAB — PROTIME-INR: Prothrombin Time: 21.1 — ABNORMAL HIGH

## 2011-09-06 ENCOUNTER — Telehealth: Payer: Self-pay | Admitting: Pulmonary Disease

## 2011-09-06 NOTE — Telephone Encounter (Signed)
lmomtcb for pt's daughter 

## 2011-09-06 NOTE — Telephone Encounter (Signed)
Called and spoke with pts daughter and she stated that the pt c/o hot flashes several times per week.  Not as bad since the weather has cooled but pt would like to be checked out for this.  appt made with SN next wed.  Pt and daughter are both aware.

## 2011-09-12 ENCOUNTER — Encounter: Payer: Self-pay | Admitting: Pulmonary Disease

## 2011-09-12 ENCOUNTER — Ambulatory Visit (INDEPENDENT_AMBULATORY_CARE_PROVIDER_SITE_OTHER): Payer: Medicare Other | Admitting: Pulmonary Disease

## 2011-09-12 VITALS — BP 132/82 | HR 75 | Temp 97.1°F | Ht 68.0 in | Wt 175.4 lb

## 2011-09-12 DIAGNOSIS — E785 Hyperlipidemia, unspecified: Secondary | ICD-10-CM

## 2011-09-12 DIAGNOSIS — R1013 Epigastric pain: Secondary | ICD-10-CM

## 2011-09-12 DIAGNOSIS — K3189 Other diseases of stomach and duodenum: Secondary | ICD-10-CM

## 2011-09-12 DIAGNOSIS — I10 Essential (primary) hypertension: Secondary | ICD-10-CM

## 2011-09-12 DIAGNOSIS — M1990 Unspecified osteoarthritis, unspecified site: Secondary | ICD-10-CM

## 2011-09-12 DIAGNOSIS — Z23 Encounter for immunization: Secondary | ICD-10-CM

## 2011-09-12 DIAGNOSIS — M545 Low back pain: Secondary | ICD-10-CM

## 2011-09-12 DIAGNOSIS — K219 Gastro-esophageal reflux disease without esophagitis: Secondary | ICD-10-CM

## 2011-09-12 DIAGNOSIS — R7309 Other abnormal glucose: Secondary | ICD-10-CM

## 2011-09-12 DIAGNOSIS — K5730 Diverticulosis of large intestine without perforation or abscess without bleeding: Secondary | ICD-10-CM

## 2011-09-12 DIAGNOSIS — K30 Functional dyspepsia: Secondary | ICD-10-CM

## 2011-09-12 MED ORDER — PANTOPRAZOLE SODIUM 40 MG PO TBEC: 40.0000 mg | DELAYED_RELEASE_TABLET | Freq: Every day | ORAL | Status: DC

## 2011-09-12 NOTE — Progress Notes (Signed)
Subjective:    Patient ID: [name], female    DOB: [birth year], 77 y.o.   MRN: [id number]  HPI 77 y/o WF here for a follow up visit... she has multiple medical problems as noted below...  Followed for general medical purposes w/ hx chr obstructive asthma, HBP, RBBB, Hypercholesterolemia, borderline DM, DJD, LBP w/ sp stenosis, etc...  ~  December 12, 2009:  good 35mo- only c/o arthritis- esp legs & neck...  given Lyrica by DrAplington ?post shingles pain?... her dyspnea is improved;  BP controlled on meds;  Chol ? & sugar OK on diet alone (reminded that she needs to be fasting for FLP)...   ~  May 18, 2010:  she's had incr difficulty w/ her right hip hemiarthroplasty & DrOlin diagnosed a prob w/ the cement & loosening of the prosthesis- needs revision but she is reluctant...  BP controlled on meds;  no CP/ palpit/ etc;  otherw stable & we discussed once again returning for FASTING blood work...  ~  November 14, 2010:  she had right hip hemiarthroplasty converted to a right THR by DrAlusio 10/11 & now much improved- still getting Pt etc... she also notes that he prev dyspnea was due to constipation- now much improved w/ peppermint tea + Senakot/ Miralax, and sauerkraut "I listen to DrOz"... BP controlled & denies CP, palpit, dyspnea, etc;  labs from hosp & 12/11 reviewed... she does not want the Flu shot.  ~  May 14, 2011:  35mo ROV & she was seen 2wks ago by TP w/ acute bronchitis> CXR was clear, chr changes; given Augmentin & Mucinex- improved... She notes occas episodes of SOB "like I can't get a deep breath" & she improves if she sits outside (?claustrophobia); we discussed trial Klonopin 0.5mg  1/2 to 1 tab po bid regularly to treat this symptom... Also c/o ringing in ears & uses Biofreeze behind her ear for resolution;  C/o neck pain & has Celebrex to use as needed;  BP controlled on meds;  Bowels regular & doing satis;  Needs TDAP today & Fasting Labs>>  FLP fair on diet alone & she  doesn't want meds for this; Chems look good w/ BS=93 & A1c=6.6 also on diet alone & reminded of low carb, no sweets etc...  ~  September 12, 2011:  39mo ROV & she is c/o hot flashes, feels it coming on & breaks out in a sweat> rec to try [employer];  Also c/o indigestion esp after she eats hot spicey foods (eg- pimento cheese pizza) & better if she eats bland, offer Sonar for screening purposes (Sonar & CTAbd in 2009 were neg) but she prefers rx noting that Tums helps, Rec to start PROTONIX 40mg /d 30' before a meal...    Chr Obstructive Asthma> stable on Advair100 & Proventil prn; notes breathing at baseline & Prn Klonopin helps dyspnea...    HBP> on Norvasc10, HCTZ25; BP=132/82, tol meds well; denies CP, palpit, ch in SOB, edema, etc...    CHOL> on diet alone, refuses meds, last LDL was 129 & she is content w/ this level of control...    DM> on diet alone, wt stable ~175#, last A1c was 6.6 & she knows to restrict carbs etc...    GI> indigestion as above + Divertics on Miralax, Senakot-S etc...    DJD/ LBP> on Celebrex prn; had right THR 2011; known sp stenosis w/ prev ESI...   Problem List:  DYSPNEA (ICD-786.05) - long hx of chronic obstructive asthma treated w/  ADVAIR & PROAIR (she uses them Prn now)... she is a non-smoker w/ some reactive airways disease in the past & retired from [employer] after 33 years in 1991... she denies cough, sputum, hemoptysis, worsening dyspnea,  wheezing, chest pains, snoring, daytime hypersomnolence, etc...  ~  baseline CXR w/o acute changes...  ~  PFT's 5/02 w/ FVC 2.07 (68%), FEV1=1.36 (57%), and FEV1/FVC ratio=66%, mid-flows 42%... ~  CT Angio 7/10 was neg- x biapical pleuroparenchymal scarring... ~  CXR 10/11 showed sl elev right hemidaiph, mild DJD sp, osteopenia, NAD.[name]. ~  CXR 6/12 showed mild apical scarring, clear & NAD, DJD sp w/ osteophytes...  HYPERTENSION (ICD-401.9) - controlled on NORVASC 10mg  daily & HCTZ 25mg tab daily... BP 132/82 today... tol rx  well & denies HA, visual changes, CP, palipit, dizziness, syncope, edema, etc...  RIGHT BUNDLE BRANCH BLOCK (ICD-426.4) - on ASA 81mg /d... baseline EKG w/ RBBB and 2DEcho 5/02 showed mild asymmetric LVH w/ incr EF...  HYPERLIPIDEMIA (ICD-272.4) - on diet alone... she forgets to come to visits FASTING for this blood work ~  FLP 8/07 showed TChol 205, TG 71, HDL 49, LDL 130... ~  FLP 6/12 on diet alone showed TChol 218, TG 50, HDL 66, LDL 129  DIABETES MELLITUS, BORDERLINE (ICD-790.29) - on diet alone w/ prev BS's in the 100-160 range... ~  labs in 2008-9 showed BS= 101 to 108 ~  labs 1/10 showed BS= 106, A1c= 5.9 ~  Labs 6/12 showed BS= 93, A1c= 6.6.Marland KitchenMarland Kitchen rec diet, exercise...  INDIGESTION/ REFLUX SYMPTOMS >> see 10/12 note & PROTONIX 40mg /d started, further eval if symptoms persist...  DIVERTICULOSIS OF COLON (ICD-562.10) - she takes SENAKOT-S, MIRALAX, Peppermint Tea, & sauerkraut Prn...last colonoscopy 9/02 by DrPerry was WNL...  PYELONEPHRITIS (ICD-590.80) - SEE 1/09 Hospitalization (reviewed)...  Hx of BREAST CYST (ICD-610.0)  DEGENERATIVE JOINT DISEASE (ICD-715.90) - s/p right hip hemiarthroplasty 11/09 by DrAplington w/ wound complic... then dx w/ loosening of the femoral shaft & had conversion to right THR by DrAlusio 10/11 & much improved... she uses CELEBREX 200mg  Prn (seldom takes this).  LOW BACK PAIN SYNDROME (ICD-724.2) & SPINAL STENOSIS (ICD-724.00) - severe LBP & spinal stenosis w/ evals by DrRamos & DrNudelman... s/p shots, considering poss surgery vs alternative therapies... she takes Celebrex, Osteobiflex, MVI, Vit D... ~  8/10: eval by DrAplington- diff leg lengths, lift placed in right shoe, then trial Lyrica50mg ... ~  12/11:  improved after hip revision surg w/ better ambulaton...  Hx of ANEMIA (ICD-285.9) - eval by GI in 2002 showed normal EGD and Colon... prob iron malabsorption problem Rx'd w/ Fe infusion... ~  labs 1/10 showed Hg= 14.6, MCV= 89, Fe= 94 ~  labs 12/11  showed Hg= 12.8, MCV= 90, Fe= 33... try Fe supplement + VitC... ~  Labs 6/12 showed Hg= 15.0  DERM:  rash Rx'd by dermatology- OLUX-E foam= clobetasol Foam 0.05%...   Past Surgical History  Procedure Date  . Cataract extraction   . Right hip hemiarthroplasty   . Conversion to right thr     Outpatient Encounter Prescriptions as of 09/12/2011  Medication Sig Dispense Refill  . albuterol (PROAIR HFA) 108 (90 BASE) MCG/ACT inhaler Inhale 2 puffs into the lungs 4 (four) times daily as needed.        [name] amLODipine (NORVASC) 10 MG tablet Take 10 mg by mouth daily.        [name] aspirin 81 MG tablet Take 81 mg by mouth daily.        . calcium carbonate (TUMS -  DOSED IN MG ELEMENTAL CALCIUM) 500 MG chewable tablet Chew 1 tablet by mouth as needed.       . celecoxib (CELEBREX) 200 MG capsule Take one capsule daily  30 capsule  6  . Cholecalciferol (VITAMIN D3) 400 UNITS CAPS Take 1 capsule by mouth daily.        . clonazePAM (KLONOPIN) 0.5 MG tablet Take 1/2 to 1 tablet by mouth two times daily as directed  60 tablet  5  . Ferrous Sulfate Dried (FEOSOL) 200 (65 FE) MG TABS Take 1 tablet by mouth 2 (two) times daily. With Vit C 500mg  with each dose       . Fluticasone-Salmeterol (ADVAIR DISKUS) 100-50 MCG/DOSE AEPB Inhale 1 puff into the lungs every 12 (twelve) hours.        . hydrochlorothiazide 25 MG tablet Take 25 mg by mouth daily.        . Menthol, Topical Analgesic, (BIOFREEZE) 4 % GEL As needed       . Misc Natural Products (OSTEO BI-FLEX ADV DOUBLE ST PO) Take 1 tablet by mouth 2 (two) times daily.        . Multiple Vitamins-Minerals (CENTRUM SILVER PO) Take 1 capsule by mouth daily.        . polyethylene glycol (MIRALAX / GLYCOLAX) packet Take 17 g by mouth daily.        [name] DISCONTD: clobetasol (OLUX) 0.05 % topical foam Apply topically as needed.          Allergies  Allergen Reactions  . Ciprofloxacin     REACTION: hallucinations    Current Medications, Allergies, Past Medical History,  Past Surgical History, Family History, and Social History were reviewed in [employer] record.    Review of Systems         See HPI - all other systems neg except as noted... The patient complains of decreased hearing, dyspnea on exertion, muscle weakness, and difficulty walking.  The patient denies anorexia, fever, weight loss, weight gain, vision loss, hoarseness, chest pain, syncope, peripheral edema, prolonged cough, headaches, hemoptysis, abdominal pain, melena, hematochezia, severe indigestion/heartburn, hematuria, incontinence, suspicious skin lesions, transient blindness, depression, unusual weight change, abnormal bleeding, enlarged lymph nodes, and angioedema.     Objective:   Physical Exam     WD, WN, Chr ill appearing 77 y/o WF in NAD... GENERAL:  Alert & oriented; pleasant & cooperative... HEENT:  [place]/AT, EOM-full, EACs-clear, TMs-wnl, NOSE-clear, THROAT-clear & wnl. NECK:  Supple w/ fairROM; no JVD; normal carotid impulses w/o bruits; no thyromegaly or nodules palpated; no lymphadenopathy. CHEST:  Clear to P & A; without wheezes/ rales/ or rhonchi heard... HEART:  Regular Rhythm; without murmurs/ rubs/ or gallops detected... ABDOMEN:  Soft & nontender; normal bowel sounds; no organomegaly or masses palpated... EXT:  mod arthritic changes, walks w/ cane, +venous insuffic & tr edema., scattered varicose veins... NEURO:  CN's intact; motor testing normal; no focal deficits... DERM:   mild intertrig rash under breast, & onychomycosis of toenails...   Assessment & Plan:   GI> Indigestion, Divertics> see above, she does not want further eval at this point, REC> start PROTONIX 40mg /d 30' before a meal; avoid spicey foods; continue Miralax, Senakot-S...   DYSPNEA>  Hx asthma, stable on Advair, Proair; hx anxiety component on Klonopin> continue Rx...  HBP>  Controlled on Norvasc, HCT, +diet etc...  RBBB>  Aware & denies CP, palpit, ch in DOE,  etc...  CHOL>  On diet alone & does not want med  Rx for this prob... We reviewed low chol, low fat diet...  DM>  A1c is 6.6 on diet alone & we reviewed low carb no sweets etc...  DJD, LBP, Spinal Stenosis>  Prev evals by Ortho, DrRamos, DrNudelman etc; improved after THR w/ better ambulation...  Anxiety>  The Klonopin Bid helps.Marland KitchenMarland Kitchen

## 2011-09-12 NOTE — Patient Instructions (Signed)
Today we updated your med list in our EPIC system...    Continue your current medications the same...    We added PROTONIX (Pantoprazole) 40mg - one tab in AM 30 min before the 1st meal of the day...  Let us know if your symptoms do not improve...    Next step is a referral to your Gastroenterologist (DrPerry) for further eval...  Call for any questions...  Let's plan a routine follow up in 4-6 months w/ fasting blood work at that time.Marland KitchenMarland Kitchen

## 2011-09-23 ENCOUNTER — Encounter: Payer: Self-pay | Admitting: Pulmonary Disease

## 2011-10-29 ENCOUNTER — Other Ambulatory Visit: Payer: Self-pay | Admitting: *Deleted

## 2011-10-29 NOTE — Progress Notes (Signed)
Error

## 2011-11-06 ENCOUNTER — Ambulatory Visit: Payer: Medicare Other | Admitting: Pulmonary Disease

## 2011-11-14 ENCOUNTER — Telehealth: Payer: Self-pay | Admitting: Pulmonary Disease

## 2011-11-14 MED ORDER — AZITHROMYCIN 250 MG PO TABS: ORAL_TABLET | ORAL | Status: AC

## 2011-11-14 NOTE — Telephone Encounter (Signed)
z-pak, mucinex DM Stay on reflux pill

## 2011-11-14 NOTE — Telephone Encounter (Signed)
[name] is aware of RA recs. rx has been sent to pharmacy. Nothing further was needed

## 2011-11-14 NOTE — Telephone Encounter (Signed)
I spoke with daughter and she states pt has this persistent cough and gets up tint yellow phlem, chest congestion, some sob w/ exertion, and loss of appetite. Denies any nausea, vomiting, fever. Pt is taking mucinex dm daily. Daughter is requesting something to be called in to help pt get over this. Please advise Dr. Vassie Loll since SN is not in. Thanks  Allergies  Allergen Reactions  . Ciprofloxacin     REACTION: hallucinations

## 2011-11-15 ENCOUNTER — Telehealth: Payer: Self-pay | Admitting: Pulmonary Disease

## 2011-11-15 MED ORDER — AMOXICILLIN 500 MG PO CAPS: 500.0000 mg | ORAL_CAPSULE | Freq: Three times a day (TID) | ORAL | Status: AC

## 2011-11-15 NOTE — Telephone Encounter (Signed)
Per cdy stop the zpak and try amoxicillin 500 mg 1 TID #21.--[id number] rx was sent

## 2011-11-15 NOTE — Telephone Encounter (Signed)
Called and spoke with pts daughter and she stated that the pt started on the zpak today and took the 2 tablets--pt now dry heaves and some SOB.  Pt not able to eat a lot--says the nausea comes and goes.  Is using the mucinex as well.  Please advise.  Thanks  Allergies  Allergen Reactions  . Ciprofloxacin     REACTION: hallucinations

## 2011-11-16 NOTE — Telephone Encounter (Signed)
Called spoke with patient and daughter.  Pt did take the azithromycin this morning and had some nausea but feels "okay."  Advised to still stop the azithromycin and to start the amoxicillin at lunch.  Advised to take this medication with a meal and to call with any adverse reactions.  Patient and daughter verbalized their understanding.  Also advised may continue the mucinex.  Pt has appt with TP 1.4.13 and will keep this appt.  Nothing further needed.

## 2011-11-16 NOTE — Telephone Encounter (Signed)
Pt's daughter stated that a message was not left regarding ZPak & amoxicillin.  Pt took the ZPak this am & was unaware to stop the ZPak.  Pt's daughter stated that the mother believes she did not eat enough prior to taking the ZPak the other 2 times.  Unsure if pt should still discontinue ZPak or to pick up amoxicillin Rx.  Please advise.  Antionette Fairy

## 2011-11-23 ENCOUNTER — Ambulatory Visit: Payer: Medicare Other | Admitting: Adult Health

## 2011-12-06 ENCOUNTER — Telehealth: Payer: Self-pay | Admitting: Pulmonary Disease

## 2011-12-06 NOTE — Telephone Encounter (Signed)
Pt finished Amoxicillin approx 1 week ago.  Stopped taking mucinex at that time also.  PT still c/o some chest congestion and non prod cough.  Instructed pt to restart Mucinex DM twice daily and if no better after a couple days call our office.

## 2011-12-20 ENCOUNTER — Other Ambulatory Visit: Payer: Self-pay | Admitting: Pulmonary Disease

## 2012-01-14 ENCOUNTER — Ambulatory Visit (INDEPENDENT_AMBULATORY_CARE_PROVIDER_SITE_OTHER): Payer: Medicare Other | Admitting: Pulmonary Disease

## 2012-01-14 ENCOUNTER — Encounter: Payer: Self-pay | Admitting: Pulmonary Disease

## 2012-01-14 DIAGNOSIS — R0602 Shortness of breath: Secondary | ICD-10-CM

## 2012-01-14 DIAGNOSIS — K5900 Constipation, unspecified: Secondary | ICD-10-CM

## 2012-01-14 DIAGNOSIS — F419 Anxiety disorder, unspecified: Secondary | ICD-10-CM

## 2012-01-14 DIAGNOSIS — M545 Low back pain: Secondary | ICD-10-CM

## 2012-01-14 DIAGNOSIS — I10 Essential (primary) hypertension: Secondary | ICD-10-CM

## 2012-01-14 DIAGNOSIS — K5730 Diverticulosis of large intestine without perforation or abscess without bleeding: Secondary | ICD-10-CM

## 2012-01-14 DIAGNOSIS — K219 Gastro-esophageal reflux disease without esophagitis: Secondary | ICD-10-CM

## 2012-01-14 DIAGNOSIS — R7309 Other abnormal glucose: Secondary | ICD-10-CM

## 2012-01-14 DIAGNOSIS — D649 Anemia, unspecified: Secondary | ICD-10-CM

## 2012-01-14 DIAGNOSIS — I4510 Unspecified right bundle-branch block: Secondary | ICD-10-CM

## 2012-01-14 DIAGNOSIS — M1990 Unspecified osteoarthritis, unspecified site: Secondary | ICD-10-CM

## 2012-01-14 DIAGNOSIS — E785 Hyperlipidemia, unspecified: Secondary | ICD-10-CM

## 2012-01-14 NOTE — Progress Notes (Signed)
Subjective:    Patient ID: [name], female    DOB: Aug 14, 1923, 78 y.o.   MRN: [id number]  HPI 78 y/o WF here for a follow up visit... she has multiple medical problems as noted below...  Followed for general medical purposes w/ hx chr obstructive asthma, HBP, RBBB, Hypercholesterolemia, borderline DM, DJD, LBP w/ sp stenosis, etc...  ~  May 14, 2011:  55mo ROV & she was seen 2wks ago by TP w/ acute bronchitis> CXR was clear, chr changes; given Augmentin & Mucinex- improved... She notes occas episodes of SOB "like I can't get a deep breath" & she improves if she sits outside (?claustrophobia); we discussed trial Klonopin 0.5mg  1/2 to 1 tab po bid regularly to treat this symptom... Also c/o ringing in ears & uses Biofreeze behind her ear for resolution;  C/o neck pain & has Celebrex to use as needed;  BP controlled on meds;  Bowels regular & doing satis;  Needs TDAP today & Fasting Labs>>  FLP fair on diet alone & she doesn't want meds for this; Chems look good w/ BS=93 & A1c=6.6 also on diet alone & reminded of low carb, no sweets etc...  ~  September 12, 2011:  48mo ROV & she is c/o hot flashes, feels it coming on & breaks out in a sweat> rec to try [employer];  Also c/o indigestion esp after she eats hot spicey foods (eg- pimento cheese pizza) & better if she eats bland, offer Sonar for screening purposes (Sonar & CTAbd in 2009 were neg) but she prefers rx noting that Tums helps, Rec to start PROTONIX 40mg /d 30' before a meal...    Chr Obstructive Asthma> stable on Advair100 & Proventil prn; notes breathing at baseline & Prn Klonopin helps dyspnea...    HBP> on Norvasc10, HCTZ25; BP=132/82, tol meds well; denies CP, palpit, ch in SOB, edema, etc...    CHOL> on diet alone, refuses meds, last LDL was 129 & she is content w/ this level of control...    DM> on diet alone, wt stable ~175#, last A1c was 6.6 & she knows to restrict carbs etc...    GI> indigestion as above + Divertics on Miralax,  Senakot-S etc...    DJD/ LBP> on Celebrex prn; had right THR 2011; known sp stenosis w/ prev ESI...  ~  January 14, 2012:  48mo ROV & she reports intermittent dyspnea in spells usually when daugh leaves and she is alone, worse at night, uses ceiling fan or sits on porch to help; we discussed this anxiety related dyspnea & she is asked to take the [facility] regularly 1/2 to 1 tab Bid; breathing otherw stable on Advair100 & Albut HFA prn...  BP & CV appear well controlled on Norvasc & HCTZ... She notes prev indigestion is improved off spicy foods and she doesn't need the PPI Rx she says; she still recommends peppermint tea & sauerkraut to friends for bowel regularity...  Problem list reviewed & f/u LABS today look good- see below>>   Problem List:  DYSPNEA (ICD-786.05) - long hx of chronic obstructive asthma treated w/ ADVAIR & PROAIR (she uses them Prn now)... she is a non-smoker w/ some reactive airways disease in the past & retired from [employer] after 33 years in 1991... she denies cough, sputum, hemoptysis, worsening dyspnea, wheezing, chest pains, snoring, daytime hypersomnolence, etc...  ~  baseline CXR w/o acute changes...  ~  PFT's 5/02 w/ FVC 2.07 (68%), FEV1=1.36 (57%), and FEV1/FVC ratio=66%, mid-flows 42%... ~  CT Angio 7/10 was neg- x biapical pleuroparenchymal scarring... ~  CXR 10/11 showed sl elev right hemidaiph, mild DJD sp, osteopenia, NAD.[name]. ~  CXR 6/12 showed mild apical scarring, clear & NAD, DJD sp w/ osteophytes... ~  Intermittent dyspnea more related to anxiety & treated w/ KLONOPIN 0.5mg  1/2 to 1 tab Bid...  HYPERTENSION (ICD-401.9) - controlled on NORVASC 10mg  daily & HCTZ 25mg tab daily... BP 144/70 today> tol rx well & denies HA, visual changes, CP, palipit, dizziness, syncope, edema, etc...  RIGHT BUNDLE BRANCH BLOCK (ICD-426.4) - on ASA 81mg /d... baseline EKG w/ RBBB and 2DEcho 5/02 showed mild asymmetric LVH w/ incr EF...  HYPERLIPIDEMIA (ICD-272.4) - on diet alone...  she forgets to come to visits FASTING for this blood work ~  FLP 8/07 showed TChol 205, TG 71, HDL 49, LDL 130... ~  FLP 6/12 on diet alone showed TChol 218, TG 50, HDL 66, LDL 129 ~  FLP 2/13 on diet alone showed TChol 171, TG 43, HDL 66, LDL 97  DIABETES MELLITUS, BORDERLINE (ICD-790.29) - on diet alone w/ prev BS's in the 100-160 range... ~  labs in 2008-9 showed BS= 101 to 108 ~  labs 1/10 showed BS= 106, A1c= 5.9 ~  Labs 6/12 showed BS= 93, A1c= 6.6.Marland KitchenMarland Kitchen rec diet, exercise... ~  Labs 2/13 showed BS= 92, A1c= 6.3  INDIGESTION/ REFLUX SYMPTOMS >> see 10/12 note & PROTONIX 40mg /d started, further eval if symptoms persist...  DIVERTICULOSIS OF COLON (ICD-562.10) - she takes SENAKOT-S, MIRALAX, Peppermint Tea, & sauerkraut Prn...last colonoscopy 9/02 by DrPerry was WNL...  PYELONEPHRITIS (ICD-590.80) - SEE 1/09 Hospitalization (reviewed)...  Hx of BREAST CYST (ICD-610.0)  DEGENERATIVE JOINT DISEASE (ICD-715.90) - s/p right hip hemiarthroplasty 11/09 by DrAplington w/ wound complic... then dx w/ loosening of the femoral shaft & had conversion to right THR by DrAlusio 10/11 & much improved... she uses CELEBREX 200mg  Prn (seldom takes this).  LOW BACK PAIN SYNDROME (ICD-724.2) & SPINAL STENOSIS (ICD-724.00) - severe LBP & spinal stenosis w/ evals by DrRamos & DrNudelman... s/p shots, considering poss surgery vs alternative therapies... she takes Celebrex, Osteobiflex, MVI, Vit D... ~  8/10: eval by DrAplington- diff leg lengths, lift placed in right shoe, then trial Lyrica50mg ... ~  12/11:  improved after hip revision surg w/ better ambulaton...  Hx of ANEMIA (ICD-285.9) - eval by GI in 2002 showed normal EGD and Colon... prob iron malabsorption problem Rx'd w/ Fe infusion... ~  labs 1/10 showed Hg= 14.6, MCV= 89, Fe= 94 ~  labs 12/11 showed Hg= 12.8, MCV= 90, Fe= 33... try Fe supplement + VitC... ~  Labs 6/12 showed Hg= 15.0 ~  Labs 2/13 showed Hg= 14.6  DERM:  rash Rx'd by dermatology-  OLUX-E foam= clobetasol Foam 0.05%...   Past Surgical History  Procedure Date  . Cataract extraction   . Right hip hemiarthroplasty   . Conversion to right thr     Outpatient Encounter Prescriptions as of 01/14/2012  Medication Sig Dispense Refill  . albuterol (PROAIR HFA) 108 (90 BASE) MCG/ACT inhaler Inhale 2 puffs into the lungs 4 (four) times daily as needed.        [name] amLODipine (NORVASC) 10 MG tablet Take 10 mg by mouth daily.        [name] aspirin 81 MG tablet Take 81 mg by mouth daily.        . calcium carbonate (TUMS - DOSED IN MG ELEMENTAL CALCIUM) 500 MG chewable tablet Chew 1 tablet by mouth as needed.       [name]  celecoxib (CELEBREX) 200 MG capsule Take one capsule daily as needed      . Cholecalciferol (VITAMIN D3) 400 UNITS CAPS Take 1 capsule by mouth daily.        . clonazePAM (KLONOPIN) 0.5 MG tablet Take 1/2 to 1 tablet by mouth two times daily as directed  60 tablet  5  . Ferrous Sulfate Dried (FEOSOL) 200 (65 FE) MG TABS Take 1 tablet by mouth 2 (two) times daily. With Vit C 500mg  with each dose       . Fluticasone-Salmeterol (ADVAIR DISKUS) 100-50 MCG/DOSE AEPB Inhale 1 puff into the lungs every 12 (twelve) hours as needed.       . hydrochlorothiazide 25 MG tablet Take 25 mg by mouth daily.        . Menthol, Topical Analgesic, (BIOFREEZE) 4 % GEL As needed       . Misc Natural Products (OSTEO BI-FLEX ADV DOUBLE ST PO) Take 1 tablet by mouth 2 (two) times daily.        . Multiple Vitamins-Minerals (CENTRUM SILVER PO) Take 1 capsule by mouth daily.        . NORVASC 5 MG tablet TAKE 1 TABLET ONCE DAILY.  60 each  11  . polyethylene glycol (MIRALAX / GLYCOLAX) packet Take 17 g by mouth daily as needed.       [name] DISCONTD: celecoxib (CELEBREX) 200 MG capsule Take one capsule daily  30 capsule  6  . pantoprazole (PROTONIX) 40 MG tablet Take 1 tablet (40 mg total) by mouth daily. Take 30 minutes before 1 st  Meal of the day  30 tablet  11    Allergies  Allergen Reactions  .  Azithromycin     Trouble breathing  . Ciprofloxacin     REACTION: hallucinations  . Latex     rash    Current Medications, Allergies, Past Medical History, Past Surgical History, Family History, and Social History were reviewed in [employer] record.    Review of Systems         See HPI - all other systems neg except as noted... The patient complains of decreased hearing, dyspnea on exertion, muscle weakness, and difficulty walking.  The patient denies anorexia, fever, weight loss, weight gain, vision loss, hoarseness, chest pain, syncope, peripheral edema, prolonged cough, headaches, hemoptysis, abdominal pain, melena, hematochezia, severe indigestion/heartburn, hematuria, incontinence, suspicious skin lesions, transient blindness, depression, unusual weight change, abnormal bleeding, enlarged lymph nodes, and angioedema.     Objective:   Physical Exam     WD, WN, Chr ill appearing 78 y/o WF in NAD... GENERAL:  Alert & oriented; pleasant & cooperative... HEENT:  [place]/AT, EOM-full, EACs-clear, TMs-wnl, NOSE-clear, THROAT-clear & wnl. NECK:  Supple w/ fairROM; no JVD; normal carotid impulses w/o bruits; no thyromegaly or nodules palpated; no lymphadenopathy. CHEST:  Clear to P & A; without wheezes/ rales/ or rhonchi heard... HEART:  Regular Rhythm; without murmurs/ rubs/ or gallops detected... ABDOMEN:  Soft & nontender; normal bowel sounds; no organomegaly or masses palpated... EXT:  mod arthritic changes, walks w/ cane, +venous insuffic & tr edema., scattered varicose veins... NEURO:  CN's intact; motor testing normal; no focal deficits... DERM:   mild intertrig rash under breast, & onychomycosis of toenails...  RADIOLOGY DATA:  Reviewed in the EPIC EMR & discussed w/ the patient...  LABORATORY DATA:  Reviewed in the EPIC EMR & discussed w/ the patient...    >>LABS 2/13:  FLP- looks good on diet alone;  Chems-  wnl w/ BS92 & A1c6.3;  CBC- wnl;  TSH- 2.86;  VitD-  46...   Assessment & Plan:    DYSPNEA>  Hx asthma, stable on Advair, Proair; hx anxiety component on Klonopin> discussed Rx...  HBP>  Controlled on Norvasc, HCT, +diet etc...  RBBB>  Aware & denies CP, palpit, ch in DOE, etc...  CHOL>  On diet alone & FLP looks reasonable;  We reviewed low chol, low fat diet...  DM>  A1c is 6.3 on diet alone & we reviewed low carb no sweets etc...  GI> Indigestion, Divertics> she notes most bowel symptoms resolved off spicey food & uses PPI just prn...   DJD, LBP, Spinal Stenosis>  Prev evals by Ortho, DrRamos, DrNudelman etc; improved after THR w/ better ambulation...  Anxiety>  The Klonopin Bid helps...   Patient's Medications  New Prescriptions   No medications on file  Previous Medications   ALBUTEROL (PROAIR HFA) 108 (90 BASE) MCG/ACT INHALER    Inhale 2 puffs into the lungs 4 (four) times daily as needed.     AMLODIPINE (NORVASC) 10 MG TABLET    Take 10 mg by mouth daily.     ASPIRIN 81 MG TABLET    Take 81 mg by mouth daily.     CALCIUM CARBONATE (TUMS - DOSED IN MG ELEMENTAL CALCIUM) 500 MG CHEWABLE TABLET    Chew 1 tablet by mouth as needed.    CHOLECALCIFEROL (VITAMIN D3) 400 UNITS CAPS    Take 1 capsule by mouth daily.     CLONAZEPAM (KLONOPIN) 0.5 MG TABLET    Take 1/2 to 1 tablet by mouth two times daily as directed   FERROUS SULFATE DRIED (FEOSOL) 200 (65 FE) MG TABS    Take 1 tablet by mouth 2 (two) times daily. With Vit C 500mg  with each dose    FLUTICASONE-SALMETEROL (ADVAIR DISKUS) 100-50 MCG/DOSE AEPB    Inhale 1 puff into the lungs every 12 (twelve) hours as needed.    HYDROCHLOROTHIAZIDE 25 MG TABLET    Take 25 mg by mouth daily.     MENTHOL, TOPICAL ANALGESIC, (BIOFREEZE) 4 % GEL    As needed    MISC NATURAL PRODUCTS (OSTEO BI-FLEX ADV DOUBLE ST PO)    Take 1 tablet by mouth 2 (two) times daily.     MULTIPLE VITAMINS-MINERALS (CENTRUM SILVER PO)    Take 1 capsule by mouth daily.     NORVASC 5 MG TABLET    TAKE 1 TABLET  ONCE DAILY.   PANTOPRAZOLE (PROTONIX) 40 MG TABLET    Take 1 tablet (40 mg total) by mouth daily. Take 30 minutes before 1 st  Meal of the day   POLYETHYLENE GLYCOL (MIRALAX / GLYCOLAX) PACKET    Take 17 g by mouth daily as needed.   Modified Medications   Modified Medication Previous Medication   CELECOXIB (CELEBREX) 200 MG CAPSULE celecoxib (CELEBREX) 200 MG capsule      Take one capsule daily as needed    Take one capsule daily  Discontinued Medications   No medications on file

## 2012-01-14 NOTE — Patient Instructions (Signed)
Today we updated your med list in our EPIC system...    Continue your current medications the same...  Please return to our lab one morning this week for your follow up fasting blood work...    Then call the [facility] in a few days for your results...    Dial [id number] & when prompted enter your patient number followed by the # symbol...    Your patient number is:  [id number]  Remember to take the KLONOPIN (Clonazepam) 1/2 to one tab daily in evening...  Call for any questions...  Let's plan a follow up visit in 4-6 months.Marland KitchenMarland Kitchen

## 2012-01-17 ENCOUNTER — Other Ambulatory Visit (INDEPENDENT_AMBULATORY_CARE_PROVIDER_SITE_OTHER): Payer: Medicare Other

## 2012-01-17 ENCOUNTER — Telehealth: Payer: Self-pay | Admitting: Pulmonary Disease

## 2012-01-17 DIAGNOSIS — I10 Essential (primary) hypertension: Secondary | ICD-10-CM

## 2012-01-17 DIAGNOSIS — R7309 Other abnormal glucose: Secondary | ICD-10-CM

## 2012-01-17 DIAGNOSIS — E785 Hyperlipidemia, unspecified: Secondary | ICD-10-CM

## 2012-01-17 DIAGNOSIS — F419 Anxiety disorder, unspecified: Secondary | ICD-10-CM

## 2012-01-17 DIAGNOSIS — F411 Generalized anxiety disorder: Secondary | ICD-10-CM

## 2012-01-17 LAB — CBC WITH DIFFERENTIAL/PLATELET
Basophils Absolute: 0 10*3/uL (ref 0.0–0.1)
Eosinophils Relative: 2.4 % (ref 0.0–5.0)
HCT: 43.1 % (ref 36.0–46.0)
Hemoglobin: 14.6 g/dL (ref 12.0–15.0)
Lymphocytes Relative: 27.8 % (ref 12.0–46.0)
Lymphs Abs: 1.6 10*3/uL (ref 0.7–4.0)
Monocytes Relative: 9.7 % (ref 3.0–12.0)
Neutro Abs: 3.4 10*3/uL (ref 1.4–7.7)
Platelets: 147 10*3/uL — ABNORMAL LOW (ref 150.0–400.0)
RDW: 13.1 % (ref 11.5–14.6)
WBC: 5.8 10*3/uL (ref 4.5–10.5)

## 2012-01-17 LAB — BASIC METABOLIC PANEL
BUN: 14 mg/dL (ref 6–23)
CO2: 36 mEq/L — ABNORMAL HIGH (ref 19–32)
Calcium: 9.4 mg/dL (ref 8.4–10.5)
Creatinine, Ser: 0.9 mg/dL (ref 0.4–1.2)
GFR: 61.38 mL/min (ref >60.00)
Glucose, Bld: 92 mg/dL (ref 70–99)

## 2012-01-17 LAB — LIPID PANEL
Cholesterol: 171 mg/dL (ref 0–200)
HDL: 65.9 mg/dL (ref >39.00)
Triglycerides: 43 mg/dL (ref 0.0–149.0)
VLDL: 8.6 mg/dL (ref 0.0–40.0)

## 2012-01-17 LAB — HEPATIC FUNCTION PANEL
Albumin: 3.9 g/dL (ref 3.5–5.2)
Total Protein: 7.3 g/dL (ref 6.0–8.3)

## 2012-01-17 LAB — TSH: TSH: 2.86 u[IU]/mL (ref 0.35–5.50)

## 2012-01-17 LAB — HEMOGLOBIN A1C: Hgb A1c MFr Bld: 6.3 % (ref 4.6–6.5)

## 2012-01-17 NOTE — Telephone Encounter (Signed)
Pt needs new handicap sticker.  Current one is expired.  Instructed caregiver that we would call when ready for pick up.

## 2012-01-17 NOTE — Telephone Encounter (Signed)
SN signed handicap placard and pt's family member, Talbert Forest, is aware form is at front desk for her to pick up.

## 2012-02-13 ENCOUNTER — Telehealth: Payer: Self-pay | Admitting: Pulmonary Disease

## 2012-02-13 ENCOUNTER — Other Ambulatory Visit: Payer: Medicare Other

## 2012-02-13 LAB — URINALYSIS, ROUTINE W REFLEX MICROSCOPIC
Bilirubin Urine: NEGATIVE
Hgb urine dipstick: NEGATIVE
Ketones, ur: NEGATIVE
pH: 6.5 (ref 5.0–8.0)

## 2012-02-13 NOTE — Telephone Encounter (Signed)
Per SN---pt may need to be seen in the ER for further eval ---it does not sound like these symptoms are related.  Spoke with pts daughter and she is aware of SN recs.  appt has been made for the pt for next Thursday to eval her breathing but daughter is aware that if pt gets worse to seek medical treatment at either ER or the urgent care.

## 2012-02-13 NOTE — Telephone Encounter (Signed)
Called, spoke with pt's daughter, Ms. Laurence Ferrari.  Reports pt is urinating every 45 minutes, and urine does have a slight odor.  Denies any pain urinating.  States pt's had these sxs before and had a UTI.  Would like to know if pt can come in to leave a urine sample.  Dr. Kriste Basque, pls advise.  Thank you.

## 2012-02-13 NOTE — Telephone Encounter (Signed)
Ms [name] is aware that orders have been placed-she is bringing sample of urine by office today. Also aware of other recs from SN.

## 2012-02-13 NOTE — Telephone Encounter (Signed)
I spoke with pt daughter and she states that the pt fell 3-4 days ago onto her right knee.  She states since then she has been having numbness and pain from her ankle to her knee on the right leg.  She also states she has had some swelling in her right ankle as well. She states on Sunday she has some bad heart burn and then became SOB. This subsided, but came back today. She states since this AM the pt has been having increased SOB again. She states the pt can place weight on the right leg without issues. Daughter wanted Dr. Jodelle Green recs before they took her to ER. Please advise. Carron Curie, CMA Allergies  Allergen Reactions  . Azithromycin     Trouble breathing  . Ciprofloxacin     REACTION: hallucinations  . Latex     rash

## 2012-02-13 NOTE — Telephone Encounter (Signed)
[name] says pt fell about 7 or 8 days ago- c/o R leg being numb since yesterday - numb below the knee. Doesn't see any spot or anything but also c/o being "very SOB" and has some blurred vision as well- call daughter at work [phone]

## 2012-02-13 NOTE — Telephone Encounter (Signed)
Per SN---yes ok to do a ua and culture please.   Start to increase water and cranberry juice.  We will call with these results.  thanks

## 2012-02-14 ENCOUNTER — Telehealth: Payer: Self-pay | Admitting: Pulmonary Disease

## 2012-02-14 MED ORDER — SULFAMETHOXAZOLE-TRIMETHOPRIM 800-160 MG PO TABS: 1.0000 | ORAL_TABLET | Freq: Two times a day (BID) | ORAL | Status: AC

## 2012-02-14 NOTE — Telephone Encounter (Signed)
Called and spoke with pts daughter and she is aware of abx sent to the pharmacy for the pt .  Nothing further is needed.

## 2012-02-16 LAB — URINE CULTURE: Colony Count: >=100000

## 2012-02-21 ENCOUNTER — Ambulatory Visit (INDEPENDENT_AMBULATORY_CARE_PROVIDER_SITE_OTHER)
Admission: RE | Admit: 2012-02-21 | Discharge: 2012-02-21 | Disposition: A | Discharge status: Home / Self Care | Payer: Medicare Other | Source: Ambulatory Visit | Attending: Pulmonary Disease | Admitting: Pulmonary Disease

## 2012-02-21 ENCOUNTER — Ambulatory Visit (INDEPENDENT_AMBULATORY_CARE_PROVIDER_SITE_OTHER): Payer: Medicare Other | Admitting: Pulmonary Disease

## 2012-02-21 ENCOUNTER — Encounter: Payer: Self-pay | Admitting: Pulmonary Disease

## 2012-02-21 VITALS — BP 148/80 | HR 74 | Temp 96.9°F | Ht 68.0 in | Wt 179.2 lb

## 2012-02-21 DIAGNOSIS — I10 Essential (primary) hypertension: Secondary | ICD-10-CM

## 2012-02-21 DIAGNOSIS — R7309 Other abnormal glucose: Secondary | ICD-10-CM

## 2012-02-21 DIAGNOSIS — M545 Low back pain: Secondary | ICD-10-CM

## 2012-02-21 DIAGNOSIS — R0602 Shortness of breath: Secondary | ICD-10-CM

## 2012-02-21 DIAGNOSIS — N390 Urinary tract infection, site not specified: Secondary | ICD-10-CM

## 2012-02-21 DIAGNOSIS — M1990 Unspecified osteoarthritis, unspecified site: Secondary | ICD-10-CM

## 2012-02-21 DIAGNOSIS — E785 Hyperlipidemia, unspecified: Secondary | ICD-10-CM

## 2012-02-21 DIAGNOSIS — K219 Gastro-esophageal reflux disease without esophagitis: Secondary | ICD-10-CM

## 2012-02-21 DIAGNOSIS — I4510 Unspecified right bundle-branch block: Secondary | ICD-10-CM

## 2012-02-21 MED ORDER — FIRST-DUKES MOUTHWASH MT SUSP: OROMUCOSAL | Status: DC

## 2012-02-21 NOTE — Progress Notes (Addendum)
Subjective:    Patient ID: [name], female    DOB: 1923-02-04, 78 y.o.   MRN: [id number]  HPI 78 y/o WF here for a follow up visit... she has multiple medical problems as noted below...  Followed for general medical purposes w/ hx chr obstructive asthma, HBP, RBBB, Hypercholesterolemia, borderline DM, DJD, LBP w/ sp stenosis, etc...  ~  May 14, 2011:  11mo ROV & she was seen 2wks ago by TP w/ acute bronchitis> CXR was clear, chr changes; given Augmentin & Mucinex- improved... She notes occas episodes of SOB "like I can't get a deep breath" & she improves if she sits outside (?claustrophobia); we discussed trial Klonopin 0.5mg  1/2 to 1 tab po bid regularly to treat this symptom... Also c/o ringing in ears & uses Biofreeze behind her ear for resolution;  C/o neck pain & has Celebrex to use as needed;  BP controlled on meds;  Bowels regular & doing satis;  Needs TDAP today & Fasting Labs>>  FLP fair on diet alone & she doesn't want meds for this; Chems look good w/ BS=93 & A1c=6.6 also on diet alone & reminded of low carb, no sweets etc...  ~  September 12, 2011:  25mo ROV & she is c/o hot flashes, feels it coming on & breaks out in a sweat> rec to try [employer];  Also c/o indigestion esp after she eats hot spicey foods (eg- pimento cheese pizza) & better if she eats bland, offer Sonar for screening purposes (Sonar & CTAbd in 2009 were neg) but she prefers rx noting that Tums helps, Rec to start PROTONIX 40mg /d 30' before a meal...    Chr Obstructive Asthma> stable on Advair100 & Proventil prn; notes breathing at baseline & Prn Klonopin helps dyspnea...    HBP> on Norvasc10, HCTZ25; BP=132/82, tol meds well; denies CP, palpit, ch in SOB, edema, etc...    CHOL> on diet alone, refuses meds, last LDL was 129 & she is content w/ this level of control...    DM> on diet alone, wt stable ~175#, last A1c was 6.6 & she knows to restrict carbs etc...    GI> indigestion as above + Divertics on Miralax,  Senakot-S etc...    DJD/ LBP> on Celebrex prn; had right THR 2011; known sp stenosis w/ prev ESI...  ~  January 14, 2012:  25mo ROV & she reports intermittent dyspnea in spells usually when daugh leaves and she is alone, worse at night, uses ceiling fan or sits on porch to help; we discussed this anxiety related dyspnea & she is asked to take the [facility] regularly 1/2 to 1 tab Bid; breathing otherw stable on Advair100 & Albut HFA prn...  BP & CV appear well controlled on Norvasc & HCTZ... She notes prev indigestion is improved off spicy foods and she doesn't need the PPI Rx she says; she still recommends peppermint tea & sauerkraut to friends for bowel regularity...  Problem list reviewed & f/u LABS today look good- see below>>  ~  February 21, 2012:  6wk ROV & her CC is tired all the time "ever since the UTI" (Klebs grew 3/13 & treated w/ Septra); requested to repeat UA & C&S to be sure it's eradicated; Dyspnea is better on the Klonopin Rx but she persist w/ mult minor somatic complaints noting incr Ufreq (UTI treated), subjective numbness in right leg (DrAlusio checked right leg & hip), excess gas w/ belching (rec to take PPI daily), sl hoarse (rec MMW), etc..[name]  See prob list below>> CXR 4/13 showed normal heart size, clear lungs, DJD in TSpine...   Problem List:     PROBLEM LIST UPDATED 02/21/12 >>  DYSPNEA (ICD-786.05) - long hx of chronic obstructive asthma treated w/ ADVAIR100Bid & PROAIR (she uses them Prn now)... she is a non-smoker w/ some reactive airways disease in the past & retired from [employer] after 33 years in 1991... she denies cough, sputum, hemoptysis, worsening dyspnea, wheezing, chest pains, snoring, daytime hypersomnolence, etc...  ~  baseline CXR w/o acute changes...  ~  PFT's 5/02 w/ FVC 2.07 (68%), FEV1=1.36 (57%), and FEV1/FVC ratio=66%, mid-flows 42%... ~  CT Angio 7/10 was neg- x biapical pleuroparenchymal scarring... ~  CXR 10/11 showed sl elev right hemidaiph, mild DJD sp,  osteopenia, NAD.[name]. ~  CXR 6/12 showed mild apical scarring, clear & NAD, DJD sp w/ osteophytes... ~  Intermittent dyspnea more related to anxiety & treated w/ KLONOPIN 0.5mg  1/2 to 1 tab Bid==> improved. ~  CXR 4/13 showed normal heart size, clear lungs, DJD in TSpine...  HYPERTENSION (ICD-401.9) - controlled on NORVASC 5mg  daily & HCTZ 25mg tab daily...  ~  2/13:  BP 144/70 today> tol rx well & denies HA, visual changes, CP, palipit, dizziness, syncope, edema, etc... ~  4/13:  BP= 148/80 & she denies CP, palpit, edema; dyspnea improved w/ Klonopin.  RIGHT BUNDLE BRANCH BLOCK (ICD-426.4) - on ASA 81mg /d... baseline EKG w/ RBBB and 2DEcho 5/02 showed mild asymmetric LVH w/ incr EF...  HYPERLIPIDEMIA (ICD-272.4) - on diet alone... she forgets to come to visits FASTING for this blood work ~  FLP 8/07 showed TChol 205, TG 71, HDL 49, LDL 130... ~  FLP 6/12 on diet alone showed TChol 218, TG 50, HDL 66, LDL 129 ~  FLP 2/13 on diet alone showed TChol 171, TG 43, HDL 66, LDL 97  DIABETES MELLITUS, BORDERLINE (ICD-790.29) - on diet alone w/ prev BS's in the 100-160 range... ~  labs in 2008-9 showed BS= 101 to 108 ~  labs 1/10 showed BS= 106, A1c= 5.9 ~  Labs 6/12 showed BS= 93, A1c= 6.6.Marland KitchenMarland Kitchen rec diet, exercise... ~  Labs 2/13 showed BS= 92, A1c= 6.3  INDIGESTION/ REFLUX SYMPTOMS >> see 10/12 note & PROTONIX 40mg /d started, further eval if symptoms persist... ~  4/13:  She notes some reflux symptoms and excess gas w/ belching; rec to take the Protonix daily & Simethacone vs Tums which she says helps her gas.  DIVERTICULOSIS OF COLON (ICD-562.10) - she takes SENAKOT-S, MIRALAX, Peppermint Tea, & sauerkraut Prn...last colonoscopy 9/02 by DrPerry was WNL...  PYELONEPHRITIS (ICD-590.80) - SEE 1/09 Hospitalization (reviewed)...  Hx of BREAST CYST (ICD-610.0)  DEGENERATIVE JOINT DISEASE (ICD-715.90) - s/p right hip hemiarthroplasty 11/09 by DrAplington w/ wound complic... then dx w/ loosening of the  femoral shaft & had conversion to right THR by DrAlusio 10/11 & much improved... she uses CELEBREX 200mg  Prn (seldom takes this).  LOW BACK PAIN SYNDROME (ICD-724.2) & SPINAL STENOSIS (ICD-724.00) - severe LBP & spinal stenosis w/ evals by DrRamos & DrNudelman... s/p shots, considering poss surgery vs alternative therapies... she takes Celebrex, Osteobiflex, MVI, Vit D... ~  8/10: eval by DrAplington- diff leg lengths, lift placed in right shoe, then trial Lyrica50mg ... ~  12/11:  improved after hip revision surg (to THR) 10/11 w/ better ambulaton...  Hx of ANEMIA (ICD-285.9) - eval by GI in 2002 showed normal EGD and Colon... prob iron malabsorption problem Rx'd w/ Fe infusion... ~  labs 1/10 showed Hg=  14.6, MCV= 89, Fe= 94 ~  labs 12/11 showed Hg= 12.8, MCV= 90, Fe= 33... try Fe supplement + VitC... ~  Labs 6/12 showed Hg= 15.0 ~  Labs 2/13 showed Hg= 14.6  DERM:  rash Rx'd by dermatology- OLUX-E foam= clobetasol Foam 0.05%...   Past Surgical History  Procedure Date  . Cataract extraction   . Right hip hemiarthroplasty   . Conversion to right thr     Outpatient Encounter Prescriptions as of 02/21/2012  Medication Sig Dispense Refill  . albuterol (PROAIR HFA) 108 (90 BASE) MCG/ACT inhaler Inhale 2 puffs into the lungs 4 (four) times daily as needed.        [name] aspirin 81 MG tablet Take 81 mg by mouth daily.        . calcium carbonate (TUMS - DOSED IN MG ELEMENTAL CALCIUM) 500 MG chewable tablet Chew 1 tablet by mouth as needed.       . Cholecalciferol (VITAMIN D3) 400 UNITS CAPS Take 1 capsule by mouth daily.        . clonazePAM (KLONOPIN) 0.5 MG tablet Take 1/2 to 1 tablet by mouth two times daily as directed  60 tablet  5  . Ferrous Sulfate Dried (FEOSOL) 200 (65 FE) MG TABS Take 1 tablet by mouth 2 (two) times daily. With Vit C 500mg  with each dose       . Fluticasone-Salmeterol (ADVAIR DISKUS) 100-50 MCG/DOSE AEPB Inhale 1 puff into the lungs every 12 (twelve) hours as needed.         . hydrochlorothiazide 25 MG tablet Take 25 mg by mouth daily.        . Menthol, Topical Analgesic, (BIOFREEZE) 4 % GEL As needed       . Misc Natural Products (OSTEO BI-FLEX ADV DOUBLE ST PO) Take 1 tablet by mouth 2 (two) times daily.        . Multiple Vitamins-Minerals (CENTRUM SILVER PO) Take 1 capsule by mouth daily.        . NORVASC 5 MG tablet TAKE 1 TABLET ONCE DAILY.  60 each  11  . pantoprazole (PROTONIX) 40 MG tablet Take 1 tablet (40 mg total) by mouth daily. Take 30 minutes before 1 st  Meal of the day  30 tablet  11  . polyethylene glycol (MIRALAX / GLYCOLAX) packet Take 17 g by mouth daily as needed.       . sulfamethoxazole-trimethoprim (BACTRIM DS,SEPTRA DS) 800-160 MG per tablet Take 1 tablet by mouth 2 (two) times daily.  14 tablet  0  . celecoxib (CELEBREX) 200 MG capsule Take one capsule daily as needed      . DISCONTD: amLODipine (NORVASC) 10 MG tablet Take 10 mg by mouth daily.          Allergies  Allergen Reactions  . Azithromycin     Trouble breathing  . Ciprofloxacin     REACTION: hallucinations  . Latex     rash    Current Medications, Allergies, Past Medical History, Past Surgical History, Family History, and Social History were reviewed in [employer] record.    Review of Systems         See HPI - all other systems neg except as noted... The patient complains of decreased hearing, dyspnea on exertion, muscle weakness, and difficulty walking.  The patient denies anorexia, fever, weight loss, weight gain, vision loss, hoarseness, chest pain, syncope, peripheral edema, prolonged cough, headaches, hemoptysis, abdominal pain, melena, hematochezia, severe indigestion/heartburn, hematuria, incontinence, suspicious skin  lesions, transient blindness, depression, unusual weight change, abnormal bleeding, enlarged lymph nodes, and angioedema.     Objective:   Physical Exam     WD, WN, Chr ill appearing 78 y/o WF in NAD... GENERAL:  Alert &  oriented; pleasant & cooperative... HEENT:  Center/AT, EOM-full, EACs-clear, TMs-wnl, NOSE-clear, THROAT-clear & wnl. NECK:  Supple w/ fairROM; no JVD; normal carotid impulses w/o bruits; no thyromegaly or nodules palpated; no lymphadenopathy. CHEST:  Clear to P & A; without wheezes/ rales/ or rhonchi heard... HEART:  Regular Rhythm; without murmurs/ rubs/ or gallops detected... ABDOMEN:  Soft & nontender; normal bowel sounds; no organomegaly or masses palpated... EXT:  mod arthritic changes, walks w/ cane, +venous insuffic & tr edema., scattered varicose veins... NEURO:  CN's intact; motor testing normal; no focal deficits... DERM:   mild intertrig rash under breast, & onychomycosis of toenails...  RADIOLOGY DATA:  Reviewed in the EPIC EMR & discussed w/ the patient... CXR 4/13 showed normal heart size, clear lungs, DJD in TSpine...  LABORATORY DATA:  Reviewed in the EPIC EMR & discussed w/ the patient... LABS 2/13:  FLP- looks good on diet alone;  Chems- wnl w/ BS92 & A1c6.3;  CBC- wnl;  TSH- 2.86;  VitD- 46...   Assessment & Plan:   UTI>  Klebsiella UTI resolved after Septra Rx...   DYSPNEA>  Hx asthma, stable on Advair, Proair; hx anxiety component on Klonopin> symptoms better w/ regular use.  HBP>  Controlled on Norvasc, HCT, +diet etc...  RBBB>  Aware & denies CP, palpit, ch in DOE, etc...  CHOL>  On diet alone & FLP looks reasonable;  We reviewed low chol, low fat diet...  DM>  A1c is 6.3 on diet alone & we reviewed low carb no sweets etc...  GI> Indigestion, Divertics> she notes most bowel symptoms resolved off spicey food & uses PPI just prn...   DJD, LBP, Spinal Stenosis>  Prev evals by Ortho, DrRamos, DrNudelman etc; improved after THR w/ better ambulation...  Anxiety>  The Klonopin Bid helps...   Patient's Medications  New Prescriptions   No medications on file  Previous Medications   ALBUTEROL (PROAIR HFA) 108 (90 BASE) MCG/ACT INHALER    Inhale 2 puffs into the  lungs 4 (four) times daily as needed.     ASPIRIN 81 MG TABLET    Take 81 mg by mouth daily.     CALCIUM CARBONATE (TUMS - DOSED IN MG ELEMENTAL CALCIUM) 500 MG CHEWABLE TABLET    Chew 1 tablet by mouth as needed.    CELECOXIB (CELEBREX) 200 MG CAPSULE    Take one capsule daily as needed   CHOLECALCIFEROL (VITAMIN D3) 400 UNITS CAPS    Take 1 capsule by mouth daily.     CLONAZEPAM (KLONOPIN) 0.5 MG TABLET    Take 1/2 to 1 tablet by mouth two times daily as directed   FERROUS SULFATE DRIED (FEOSOL) 200 (65 FE) MG TABS    Take 1 tablet by mouth 2 (two) times daily. With Vit C 500mg  with each dose    FLUTICASONE-SALMETEROL (ADVAIR DISKUS) 100-50 MCG/DOSE AEPB    Inhale 1 puff into the lungs every 12 (twelve) hours as needed.    HYDROCHLOROTHIAZIDE 25 MG TABLET    Take 25 mg by mouth daily.     MENTHOL, TOPICAL ANALGESIC, (BIOFREEZE) 4 % GEL    As needed    MISC NATURAL PRODUCTS (OSTEO BI-FLEX ADV DOUBLE ST PO)    Take 1 tablet by mouth 2 (  two) times daily.     MULTIPLE VITAMINS-MINERALS (CENTRUM SILVER PO)    Take 1 capsule by mouth daily.     NORVASC 5 MG TABLET    TAKE 1 TABLET ONCE DAILY.   PANTOPRAZOLE (PROTONIX) 40 MG TABLET    Take 1 tablet (40 mg total) by mouth daily. Take 30 minutes before 1 st  Meal of the day   POLYETHYLENE GLYCOL (MIRALAX / GLYCOLAX) PACKET    Take 17 g by mouth daily as needed.    SULFAMETHOXAZOLE-TRIMETHOPRIM (BACTRIM DS,SEPTRA DS) 800-160 MG PER TABLET    Take 1 tablet by mouth 2 (two) times daily.  Modified Medications   No medications on file  Discontinued Medications   AMLODIPINE (NORVASC) 10 MG TABLET    Take 10 mg by mouth daily.

## 2012-02-21 NOTE — Patient Instructions (Signed)
Today we updated your med list in our EPIC system...    Continue your current medications the same... and finish the current antibiotic...  We wrote a new prescription for [place] Mouthwash> one tsp gargle & swallow up to 4 times daily as needed...  Today we did a follow up CXR...    We will call you w/ this report when avail...  Be sure to drink plenty of water; cranberry juice is good too...    Take the Mucinex 1-2 tabs twice daily for the congestion...  Let's plan a follow up Urinalysis & culture in 10d or so to be sure it's gone.Marland KitchenMarland Kitchen

## 2012-02-25 ENCOUNTER — Telehealth: Payer: Self-pay | Admitting: Pulmonary Disease

## 2012-02-25 ENCOUNTER — Encounter: Payer: Self-pay | Admitting: Internal Medicine

## 2012-02-25 DIAGNOSIS — K219 Gastro-esophageal reflux disease without esophagitis: Secondary | ICD-10-CM

## 2012-02-25 NOTE — Telephone Encounter (Signed)
Per SN last ov note that if these symptoms persisted that she would need further eval.  We can refer to GI--anyone she wants to see. thanks

## 2012-02-25 NOTE — Telephone Encounter (Signed)
Notes Recorded by Michele Mcalpine, MD on 02/24/2012 at 4:34 PM Pt notified by Leigh... [facility] CXR looks ok- clear, NAD...      Spoke with patients daughter-pt is having heartburn for so long and has tried OTC PPI's (tums, Prilosec) not getting any better. Daughter is aware of CXR results. Daughter is wondering if patient should have GI referral. Heart racing when having increased heartburn. Please advise.

## 2012-02-25 NOTE — Telephone Encounter (Signed)
Spoke with pt daughter and they request appt with Dr. Juanda Chance. Order placed. Carron Curie, CMA

## 2012-02-26 ENCOUNTER — Telehealth: Payer: Self-pay | Admitting: Internal Medicine

## 2012-02-26 NOTE — Telephone Encounter (Signed)
Requesting sooner appt for Gerd and epigastric discomfort. Pt scheduled to see Willette Cluster NP tomorrow at 11am. Pt aware of appt date and time.

## 2012-02-27 ENCOUNTER — Encounter: Payer: Self-pay | Admitting: Nurse Practitioner

## 2012-02-27 ENCOUNTER — Ambulatory Visit (INDEPENDENT_AMBULATORY_CARE_PROVIDER_SITE_OTHER): Payer: Medicare Other | Admitting: Nurse Practitioner

## 2012-02-27 VITALS — BP 138/74 | HR 84 | Ht 68.0 in | Wt 179.0 lb

## 2012-02-27 DIAGNOSIS — K219 Gastro-esophageal reflux disease without esophagitis: Secondary | ICD-10-CM

## 2012-02-27 MED ORDER — OMEPRAZOLE MAGNESIUM 20 MG PO TBEC: DELAYED_RELEASE_TABLET | ORAL | Status: DC

## 2012-02-27 NOTE — Patient Instructions (Signed)
Keep your appointment with Dr. Marina Goodell on 03-25-2012 at 2:30 PM .  We have given you samples of Prilosec OTC 20 mg to take 1 capsule 30 min before breakfast . We have also given you some reflux information.

## 2012-02-27 NOTE — Progress Notes (Signed)
02/27/2012 [name] [id number] 18-Jun-1923   HISTORY OF PRESENT ILLNESS: Patient is an 78 year old female who hasn't been here since 2002. She saw Dr. Marina [name] in September 2002 for evaluation of anemia. Her EGD was normal. Small bowel biopsy negative for celiac. Her colonoscopy was incomplete due to technical difficulties. Exam was only to the hepatic flexure but otherwise normal.  Patient gives a longstanding history of indigestion defined as belching and heartburn for years. Symptoms worse with spicy foods. Over the last 3 weeks her symptoms have become much worse. No associated dysphagia. Patient takes Tums which usually help. Dr. Kriste Basque recommended Prilosec but she doesn't like to take any more pills than she has to. No coughing. She is more SOB than usual.     Past Medical History  Diagnosis Date  . Shortness of breath   . Unspecified essential hypertension   . Right bundle branch block   . Other and unspecified hyperlipidemia   . Other abnormal glucose   . Diverticulosis of colon (without mention of hemorrhage)   . Pyelonephritis, unspecified   . Solitary cyst of breast   . Osteoarthrosis, unspecified whether generalized or localized, unspecified site   . Lumbago   . Spinal stenosis, unspecified region other than cervical   . Anemia, unspecified    Past Surgical History  Procedure Date  . Cataract extraction   . Right hip hemiarthroplasty   . Conversion to right thr     reports that she has never smoked. She has never used smokeless tobacco. She reports that she does not drink alcohol or use illicit drugs. family history includes Cancer in her brothers. Allergies  Allergen Reactions  . Azithromycin     Trouble breathing  . Ciprofloxacin     REACTION: hallucinations  . Latex     rash      Outpatient Encounter Prescriptions as of 02/27/2012  Medication Sig Dispense Refill  . albuterol (PROAIR HFA) 108 (90 BASE) MCG/ACT inhaler Inhale 2 puffs into the lungs 4  (four) times daily as needed.        [name] aspirin 81 MG tablet Take 81 mg by mouth daily.        . calcium carbonate (TUMS - DOSED IN MG ELEMENTAL CALCIUM) 500 MG chewable tablet Chew 1 tablet by mouth as needed.       . celecoxib (CELEBREX) 200 MG capsule Take one capsule daily as needed      . Cholecalciferol (VITAMIN D3) 400 UNITS CAPS Take 1 capsule by mouth daily.        . clonazePAM (KLONOPIN) 0.5 MG tablet Take 1/2 to 1 tablet by mouth two times daily as directed  60 tablet  5  . Diphenhyd-Hydrocort-Nystatin (FIRST-DUKES MOUTHWASH) SUSP 1 tsp gargle and swallow four times daily as needed  120 mL  5  . Ferrous Sulfate Dried (FEOSOL) 200 (65 FE) MG TABS Take 1 tablet by mouth 2 (two) times daily. With Vit C 500mg  with each dose       . Fluticasone-Salmeterol (ADVAIR DISKUS) 100-50 MCG/DOSE AEPB Inhale 1 puff into the lungs every 12 (twelve) hours as needed.       . hydrochlorothiazide 25 MG tablet Take 25 mg by mouth daily.        . Menthol, Topical Analgesic, (BIOFREEZE) 4 % GEL As needed       . Misc Natural Products (OSTEO BI-FLEX ADV DOUBLE ST PO) Take 1 tablet by mouth 2 (two) times daily.        [name]  Multiple Vitamins-Minerals (CENTRUM SILVER PO) Take 1 capsule by mouth daily.        . NORVASC 5 MG tablet TAKE 1 TABLET ONCE DAILY.  60 each  11  . pantoprazole (PROTONIX) 40 MG tablet Take 1 tablet (40 mg total) by mouth daily. Take 30 minutes before 1 st  Meal of the day  30 tablet  11  . polyethylene glycol (MIRALAX / GLYCOLAX) packet Take 17 g by mouth daily as needed.         REVIEW OF SYSTEMS  : Mild dizziness. Bilateral lower extremity numbness. All other systems reviewed and negative except where noted in the History of Present Illness.  PHYSICAL EXAM: Wt 179 lb (81.194 kg) General: Well developed white female in no acute distress. Hoarse voice.  Head: Normocephalic and atraumatic Eyes:  sclerae anicteric,conjunctive pink. Ears: Normal auditory acuity Neck: Supple, no masses.    Lungs: Clear throughout to auscultation Heart: Regular rate and rhythm Abdomen: Soft, non distended, nontender. No masses or hepatomegaly noted. Normal Bowel sounds Rectal: not done Musculoskeletal: Symmetrical with no gross deformities  Skin: No lesions on visible extremities Extremities: Trace BLE edema. Neurological: Alert oriented, grossly nonfocal Cervical Nodes:  No significant cervical adenopathy Psychological:  Alert and cooperative. Normal mood and affect  ASSESSMENT AND PLAN;  1. GERD, untreated. Her voice is hoarse. Tums help belching and burning. Patient has been hesitant to add another pill (PPI) to regimen but agrees to try daily PPI for at least one month to see if symptoms improve. She doesn't have daily GERD symptoms so maybe in the future she can be maintained on three times a week dosing. Anti-reflux measures discussed and writtien literature given.   2. Asthma, stable per pulmonary.   3. History of iron deficiency anemia worked up by Dr. Marina [name] in 2002 (see HPI). Hemoglobin normal range since at least December 2011.

## 2012-02-28 ENCOUNTER — Encounter: Payer: Self-pay | Admitting: Nurse Practitioner

## 2012-02-28 NOTE — Progress Notes (Signed)
i agree with the plan outlined in this note 

## 2012-03-20 ENCOUNTER — Other Ambulatory Visit (INDEPENDENT_AMBULATORY_CARE_PROVIDER_SITE_OTHER): Payer: Medicare Other

## 2012-03-20 ENCOUNTER — Telehealth: Payer: Self-pay | Admitting: Pulmonary Disease

## 2012-03-20 DIAGNOSIS — N390 Urinary tract infection, site not specified: Secondary | ICD-10-CM

## 2012-03-20 LAB — URINALYSIS
Bilirubin Urine: NEGATIVE
Hgb urine dipstick: NEGATIVE
Nitrite: NEGATIVE
Total Protein, Urine: NEGATIVE
Urobilinogen, UA: 0.2 (ref 0.0–1.0)

## 2012-03-20 NOTE — Telephone Encounter (Signed)
Spoke with pt's daughter and informed her that lab orders are still in computer and she can come anytime to leave specimen.

## 2012-03-25 ENCOUNTER — Ambulatory Visit (INDEPENDENT_AMBULATORY_CARE_PROVIDER_SITE_OTHER): Payer: Medicare Other | Admitting: Internal Medicine

## 2012-03-25 ENCOUNTER — Other Ambulatory Visit: Payer: Self-pay | Admitting: Pulmonary Disease

## 2012-03-25 ENCOUNTER — Encounter: Payer: Self-pay | Admitting: Internal Medicine

## 2012-03-25 VITALS — BP 126/72 | HR 78 | Ht 68.0 in | Wt 176.0 lb

## 2012-03-25 DIAGNOSIS — R1013 Epigastric pain: Secondary | ICD-10-CM

## 2012-03-25 DIAGNOSIS — K3189 Other diseases of stomach and duodenum: Secondary | ICD-10-CM

## 2012-03-25 DIAGNOSIS — K219 Gastro-esophageal reflux disease without esophagitis: Secondary | ICD-10-CM

## 2012-03-25 LAB — URINE CULTURE

## 2012-03-25 MED ORDER — OMEPRAZOLE 20 MG PO CPDR: 20.0000 mg | DELAYED_RELEASE_CAPSULE | Freq: Every day | ORAL | Status: DC

## 2012-03-25 MED ORDER — LEVOFLOXACIN 500 MG PO TABS: 500.0000 mg | ORAL_TABLET | Freq: Every day | ORAL | Status: DC

## 2012-03-25 NOTE — Patient Instructions (Signed)
We have sent the following medications to your pharmacy for you to pick up at your convenience:  Nexium  

## 2012-03-25 NOTE — Progress Notes (Signed)
HISTORY OF PRESENT ILLNESS:  [name] is a 78 y.o. female who presents today for followup. She is accompanied by her daughter. I have not seen the patient in greater than 1 decade. She was seen in the office, by the extender, 4 weeks ago regarding "indigestion". This is described as several symptoms including postprandial fullness with subsequent belching, intermittent hoarseness, and awakening with shortness of breath (she has been evaluated by her PCP regarding the latter). Prior upper endoscopy was unremarkable. No dysphagia. She was placed on PPI which he is taking daily. They presents today for followup. The patient and her daughter report improvement in all symptoms. She is tolerating the medication well. She did have some symptoms several days ago after eating a southwestern chicken salad.  REVIEW OF SYSTEMS:  All non-GI ROS negative   Past Medical History  Diagnosis Date  . Shortness of breath   . Unspecified essential hypertension   . Right bundle branch block   . Other and unspecified hyperlipidemia   . Other abnormal glucose   . Diverticulosis of colon (without mention of hemorrhage)   . Pyelonephritis, unspecified   . Solitary cyst of breast   . Osteoarthrosis, unspecified whether generalized or localized, unspecified site   . Lumbago   . Spinal stenosis, unspecified region other than cervical   . Anemia, unspecified     Past Surgical History  Procedure Date  . Cataract extraction   . Right hip hemiarthroplasty   . Conversion to right thr     Social History [name]  reports that she has never smoked. She has never used smokeless tobacco. She reports that she does not drink alcohol or use illicit drugs.  family history includes Cancer in her brothers.  Allergies  Allergen Reactions  . Azithromycin     Trouble breathing  . Ciprofloxacin     REACTION: hallucinations  . Latex     rash       PHYSICAL EXAMINATION:  Vital signs: BP 126/72   Pulse 78  Ht 5\' 8"  (1.727 m)  Wt 176 lb (79.833 kg)  BMI 26.76 kg/m2  SpO2 97% General:Elderly. Well-developed, well-nourished, no acute distress HEENT: Sclerae are anicteric, conjunctiva pink. Oral mucosa intact Lungs: Clear Heart: Regular Abdomen: soft, nontender, nondistended, no obvious ascites, no peritoneal signs, normal bowel sounds. No organomegaly. Extremities: No edema Psychiatric: alert and oriented x3. Cooperative     ASSESSMENT:  #1. Dyspeptic symptoms. Seemingly improved after initiation PPI. Question GERD equivalent. Not sure that GERD explains all of her symptoms. In any event, she is improved, and pleased   PLAN:  #1. Reflux precautions #2. Prescribed Prilosec (patient does not want generic) 20 mg daily with multiple refills #3. GI followup when necessary. Resume care with Dr. Kriste Basque.

## 2012-03-26 ENCOUNTER — Telehealth: Payer: Self-pay | Admitting: Pulmonary Disease

## 2012-03-26 NOTE — Telephone Encounter (Addendum)
Called and spoke with pts daughter and she stated that her mother does not want to take the abx sent in.  Talbert Forest is aware that they can try the 1/2 tab in the morning and 1/2 tab in the evening to see if the pt can tolerate this better.  She will talk with her mother and see if this would be something that she will try.  She did state that the pt is drinking cranberry juice daily now.  Talbert Forest is to call back for any other concerns.

## 2012-03-27 ENCOUNTER — Encounter: Payer: Self-pay | Admitting: Internal Medicine

## 2012-04-03 ENCOUNTER — Telehealth: Payer: Self-pay | Admitting: Pulmonary Disease

## 2012-04-03 DIAGNOSIS — Z87440 Personal history of urinary (tract) infections: Secondary | ICD-10-CM

## 2012-04-03 NOTE — Telephone Encounter (Signed)
Called and spoke with [name] and she stated that the pt had reaction to the levaquin---she was unable to sleep and severe indigestion.  Pt will come in for appt on Monday---her daughter gave up her appt for her mom.  Spoke with SN and he recs to send pt to urology---order has been placed for this and [name] is aware that we will call with appt.

## 2012-04-07 ENCOUNTER — Ambulatory Visit: Payer: Medicare Other | Admitting: Pulmonary Disease

## 2012-05-13 ENCOUNTER — Encounter: Payer: Self-pay | Admitting: Pulmonary Disease

## 2012-05-13 ENCOUNTER — Ambulatory Visit (INDEPENDENT_AMBULATORY_CARE_PROVIDER_SITE_OTHER): Payer: Medicare Other | Admitting: Pulmonary Disease

## 2012-05-13 VITALS — BP 138/68 | HR 84 | Temp 97.0°F | Ht 68.0 in | Wt 172.8 lb

## 2012-05-13 DIAGNOSIS — M1990 Unspecified osteoarthritis, unspecified site: Secondary | ICD-10-CM

## 2012-05-13 DIAGNOSIS — I4510 Unspecified right bundle-branch block: Secondary | ICD-10-CM

## 2012-05-13 DIAGNOSIS — M4800 Spinal stenosis, site unspecified: Secondary | ICD-10-CM

## 2012-05-13 DIAGNOSIS — E785 Hyperlipidemia, unspecified: Secondary | ICD-10-CM

## 2012-05-13 DIAGNOSIS — K5730 Diverticulosis of large intestine without perforation or abscess without bleeding: Secondary | ICD-10-CM

## 2012-05-13 DIAGNOSIS — K219 Gastro-esophageal reflux disease without esophagitis: Secondary | ICD-10-CM

## 2012-05-13 DIAGNOSIS — I10 Essential (primary) hypertension: Secondary | ICD-10-CM

## 2012-05-13 DIAGNOSIS — M545 Low back pain, unspecified: Secondary | ICD-10-CM

## 2012-05-13 DIAGNOSIS — D649 Anemia, unspecified: Secondary | ICD-10-CM

## 2012-05-13 DIAGNOSIS — K5900 Constipation, unspecified: Secondary | ICD-10-CM

## 2012-05-13 DIAGNOSIS — N390 Urinary tract infection, site not specified: Secondary | ICD-10-CM

## 2012-05-13 MED ORDER — CELECOXIB 200 MG PO CAPS: 200.0000 mg | ORAL_CAPSULE | Freq: Every day | ORAL | Status: DC | PRN

## 2012-05-13 NOTE — Patient Instructions (Addendum)
Today we updated your med list in our EPIC system...    Continue your current medications the same...  Stay as active as poss 7 do some "chair exercises" every day...  Call for any problems...  Let's plan a follow up visit in 4 months.Marland KitchenMarland Kitchen

## 2012-05-13 NOTE — Progress Notes (Signed)
Subjective:    Patient ID: [name], female    DOB: 1923-02-04, 78 y.o.   MRN: [id number]  HPI 78 y/o WF here for a follow up visit... she has multiple medical problems as noted below...  Followed for general medical purposes w/ hx chr obstructive asthma, HBP, RBBB, Hypercholesterolemia, borderline DM, DJD, LBP w/ sp stenosis, etc...  ~  May 14, 2011:  11mo ROV & she was seen 2wks ago by TP w/ acute bronchitis> CXR was clear, chr changes; given Augmentin & Mucinex- improved... She notes occas episodes of SOB "like I can't get a deep breath" & she improves if she sits outside (?claustrophobia); we discussed trial Klonopin 0.5mg  1/2 to 1 tab po bid regularly to treat this symptom... Also c/o ringing in ears & uses Biofreeze behind her ear for resolution;  C/o neck pain & has Celebrex to use as needed;  BP controlled on meds;  Bowels regular & doing satis;  Needs TDAP today & Fasting Labs>>  FLP fair on diet alone & she doesn't want meds for this; Chems look good w/ BS=93 & A1c=6.6 also on diet alone & reminded of low carb, no sweets etc...  ~  September 12, 2011:  25mo ROV & she is c/o hot flashes, feels it coming on & breaks out in a sweat> rec to try [employer];  Also c/o indigestion esp after she eats hot spicey foods (eg- pimento cheese pizza) & better if she eats bland, offer Sonar for screening purposes (Sonar & CTAbd in 2009 were neg) but she prefers rx noting that Tums helps, Rec to start PROTONIX 40mg /d 30' before a meal...    Chr Obstructive Asthma> stable on Advair100 & Proventil prn; notes breathing at baseline & Prn Klonopin helps dyspnea...    HBP> on Norvasc10, HCTZ25; BP=132/82, tol meds well; denies CP, palpit, ch in SOB, edema, etc...    CHOL> on diet alone, refuses meds, last LDL was 129 & she is content w/ this level of control...    DM> on diet alone, wt stable ~175#, last A1c was 6.6 & she knows to restrict carbs etc...    GI> indigestion as above + Divertics on Miralax,  Senakot-S etc...    DJD/ LBP> on Celebrex prn; had right THR 2011; known sp stenosis w/ prev ESI...  ~  January 14, 2012:  25mo ROV & she reports intermittent dyspnea in spells usually when daugh leaves and she is alone, worse at night, uses ceiling fan or sits on porch to help; we discussed this anxiety related dyspnea & she is asked to take the [facility] regularly 1/2 to 1 tab Bid; breathing otherw stable on Advair100 & Albut HFA prn...  BP & CV appear well controlled on Norvasc & HCTZ... She notes prev indigestion is improved off spicy foods and she doesn't need the PPI Rx she says; she still recommends peppermint tea & sauerkraut to friends for bowel regularity...  Problem list reviewed & f/u LABS today look good- see below>>  ~  February 21, 2012:  6wk ROV & her CC is tired all the time "ever since the UTI" (Klebs grew 3/13 & treated w/ Septra); requested to repeat UA & C&S to be sure it's eradicated; Dyspnea is better on the Klonopin Rx but she persist w/ mult minor somatic complaints noting incr Ufreq (UTI treated), subjective numbness in right leg (DrAlusio checked right leg & hip), excess gas w/ belching (rec to take PPI daily), sl hoarse (rec MMW), etc..[name]  See prob list below>> CXR 4/13 showed normal heart size, clear lungs, DJD in TSpine...  ~  May 13, 2012:  24mo ROV & she has had multiple subspecialty visits in the interval (see below)> in addition she is c/o "my blood veins are coming back out" noting some swelling in the legs but she can't wear any support hose due to discomfort- I explained that she is then relegated to no salt & elevation for control...    She saw GI> DrPerry & PGuenter in April/May for indigestion; she had mult complaints, most improved w/ Prilosec.[name]     She saw DrMacDiarmid 5/13 for Urology f/u of her recurrent UTIs, chronic cystitis, urge & stress incont, nocturia> hx intol Cipro/ Levaquin, she did tol Septra, he is following her cultures & discussed asymptomatic bacturia w/  them...    She saw DrRamos 5/13 for leg pain> he felt her leg discomfort was coming from lumbar DDD w/ spinal stenosis at L3-5 & L4-5; he gave her another [facility] & continues to follow her progress; she had seen DrNudelman several yrs ago as well... We reviewed prob list, meds, xrays and labs> see below>>   Problem List:       DYSPNEA (ICD-786.05) - long hx of chronic obstructive asthma treated w/ ADVAIR100Bid & PROAIR (she uses them Prn now)... she is a non-smoker w/ some reactive airways disease in the past & retired from [employer] after 33 years in 1991... she denies cough, sputum, hemoptysis, worsening dyspnea, wheezing, chest pains, snoring, daytime hypersomnolence, etc...  ~  baseline CXR w/o acute changes...  ~  PFT's 5/02 w/ FVC 2.07 (68%), FEV1=1.36 (57%), and FEV1/FVC ratio=66%, mid-flows 42%... ~  CT Angio 7/10 was neg- x biapical pleuroparenchymal scarring... ~  CXR 10/11 showed sl elev right hemidaiph, mild DJD sp, osteopenia, NAD.[name]. ~  CXR 6/12 showed mild apical scarring, clear & NAD, DJD sp w/ osteophytes... ~  Intermittent dyspnea more related to anxiety & treated w/ KLONOPIN 0.5mg  1/2 to 1 tab Bid==> improved. ~  CXR 4/13 showed normal heart size, clear lungs, DJD in TSpine...  HYPERTENSION (ICD-401.9) - controlled on NORVASC 5mg  daily & HCTZ 25mg tab daily...  ~  2/13:  BP 144/70 today> tol rx well & denies HA, visual changes, CP, palipit, dizziness, syncope, edema, etc... ~  4/13:  BP= 148/80 & she denies CP, palpit, edema; dyspnea improved w/ Klonopin. ~  6/13:  BP= 132/68 & as noted she has mult somatic complaints...  RIGHT BUNDLE BRANCH BLOCK (ICD-426.4) - on ASA 81mg /d... baseline EKG w/ RBBB and 2DEcho 5/02 showed mild asymmetric LVH w/ incr EF...  HYPERLIPIDEMIA (ICD-272.4) - on diet alone... she forgets to come to visits FASTING for this blood work ~  FLP 8/07 showed TChol 205, TG 71, HDL 49, LDL 130... ~  FLP 6/12 on diet alone showed TChol 218, TG 50, HDL 66, LDL  129 ~  FLP 2/13 on diet alone showed TChol 171, TG 43, HDL 66, LDL 97  DIABETES MELLITUS, BORDERLINE (ICD-790.29) - on diet alone w/ prev BS's in the 100-160 range... ~  labs in 2008-9 showed BS= 101 to 108 ~  labs 1/10 showed BS= 106, A1c= 5.9 ~  Labs 6/12 showed BS= 93, A1c= 6.6.Marland KitchenMarland Kitchen rec diet, exercise... ~  Labs 2/13 showed BS= 92, A1c= 6.3  INDIGESTION/ REFLUX SYMPTOMS >> see 10/12 note & PROTONIX 40mg /d started, further eval if symptoms persist... ~  4/13:  She notes some reflux symptoms and excess gas w/ belching; rec to  take the Protonix daily & Simethacone vs Tums which she says helps her gas. ~  5/13:  She saw GI DrPerry w/ rec to take Prilosec for her indigestion...  DIVERTICULOSIS OF COLON (ICD-562.10) - she takes SENAKOT-S, MIRALAX, Peppermint Tea, & sauerkraut Prn...last colonoscopy 9/02 by DrPerry was WNL...  PYELONEPHRITIS (ICD-590.80) - SEE 1/09 Hospitalization (reviewed)... ~  She saw DrMacDiarmid for her recurrent UTIs, chronic cystitis, urge & stress incont, nocturia; she is INTOL to Cipro & Levaquin...  Hx of BREAST CYST (ICD-610.0)  DEGENERATIVE JOINT DISEASE (ICD-715.90) - s/p right hip hemiarthroplasty 11/09 by DrAplington w/ wound complic... then dx w/ loosening of the femoral shaft & had conversion to right THR by DrAlusio 10/11 & much improved... she uses CELEBREX 200mg  Prn (seldom takes this).  LOW BACK PAIN SYNDROME (ICD-724.2) & SPINAL STENOSIS (ICD-724.00) - severe LBP & spinal stenosis w/ evals by DrRamos & DrNudelman... s/p shots, considering poss surgery vs alternative therapies... she takes Celebrex, Osteobiflex, MVI, Vit D... ~  8/10: eval by DrAplington- diff leg lengths, lift placed in right shoe, then trial Lyrica50mg ... ~  12/11:  improved after hip revision surg (to THR) 10/11 w/ better ambulaton... ~  5/13:  DrRamos gave her another ESI for her leg pain related to sp stenosis...  Hx of ANEMIA (ICD-285.9) - eval by GI in 2002 showed normal EGD and  Colon... prob iron malabsorption problem Rx'd w/ Fe infusion... ~  labs 1/10 showed Hg= 14.6, MCV= 89, Fe= 94 ~  labs 12/11 showed Hg= 12.8, MCV= 90, Fe= 33... try Fe supplement + VitC... ~  Labs 6/12 showed Hg= 15.0 ~  Labs 2/13 showed Hg= 14.6  DERM:  rash Rx'd by dermatology- OLUX-E foam= clobetasol Foam 0.05%...   Past Surgical History  Procedure Date  . Cataract extraction   . Right hip hemiarthroplasty   . Conversion to right thr     Outpatient Encounter Prescriptions as of 05/13/2012  Medication Sig Dispense Refill  . albuterol (PROAIR HFA) 108 (90 BASE) MCG/ACT inhaler Inhale 2 puffs into the lungs 4 (four) times daily as needed.        [name] aspirin 81 MG tablet Take 81 mg by mouth daily.        . calcium carbonate (TUMS - DOSED IN MG ELEMENTAL CALCIUM) 500 MG chewable tablet Chew 1 tablet by mouth as needed.       . celecoxib (CELEBREX) 200 MG capsule Take one capsule daily as needed      . Cholecalciferol (VITAMIN D3) 400 UNITS CAPS Take 1 capsule by mouth daily.        . Diphenhyd-Hydrocort-Nystatin (FIRST-DUKES MOUTHWASH) SUSP 1 tsp gargle and swallow four times daily as needed  120 mL  5  . Ferrous Sulfate Dried (FEOSOL) 200 (65 FE) MG TABS Take 1 tablet by mouth 2 (two) times daily. With Vit C 500mg  with each dose       . Fluticasone-Salmeterol (ADVAIR DISKUS) 100-50 MCG/DOSE AEPB Inhale 1 puff into the lungs every 12 (twelve) hours as needed.       . hydrochlorothiazide 25 MG tablet Take 25 mg by mouth daily.        . Menthol, Topical Analgesic, (BIOFREEZE) 4 % GEL As needed       . Misc Natural Products (OSTEO BI-FLEX ADV DOUBLE ST PO) Take 1 tablet by mouth 2 (two) times daily.        . Multiple Vitamins-Minerals (CENTRUM SILVER PO) Take 1 capsule by mouth daily.        [name]  NORVASC 5 MG tablet TAKE 1 TABLET ONCE DAILY.  60 each  11  . omeprazole (PRILOSEC) 20 MG capsule Take 1 capsule (20 mg total) by mouth daily.  30 capsule  11  . polyethylene glycol (MIRALAX / GLYCOLAX)  packet Take 17 g by mouth daily as needed.       [name] DISCONTD: clonazePAM (KLONOPIN) 0.5 MG tablet Take 1/2 to 1 tablet by mouth two times daily as directed  60 tablet  5  . DISCONTD: omeprazole (PRILOSEC OTC) 20 MG tablet Take 1 capsule 30 minutes before breakfast for 1 month.  30 tablet  0  . DISCONTD: pantoprazole (PROTONIX) 40 MG tablet Take 1 tablet (40 mg total) by mouth daily. Take 30 minutes before 1 st  Meal of the day  30 tablet  11    Allergies  Allergen Reactions  . Azithromycin     Trouble breathing  . Ciprofloxacin     REACTION: hallucinations  . Latex     rash  . Levaquin (Levofloxacin In D5w)     Causes pt unable to sleep, legs are tingling, severe indigestion.      Current Medications, Allergies, Past Medical History, Past Surgical History, Family History, and Social History were reviewed in [employer] record.    Review of Systems         See HPI - all other systems neg except as noted... The patient complains of decreased hearing, dyspnea on exertion, muscle weakness, and difficulty walking.  The patient denies anorexia, fever, weight loss, weight gain, vision loss, hoarseness, chest pain, syncope, peripheral edema, prolonged cough, headaches, hemoptysis, abdominal pain, melena, hematochezia, severe indigestion/heartburn, hematuria, incontinence, suspicious skin lesions, transient blindness, depression, unusual weight change, abnormal bleeding, enlarged lymph nodes, and angioedema.     Objective:   Physical Exam     WD, WN, Chr ill appearing 78 y/o WF in NAD... GENERAL:  Alert & oriented; pleasant & cooperative... HEENT:  [place]/AT, EOM-full, EACs-clear, TMs-wnl, NOSE-clear, THROAT-clear & wnl. NECK:  Supple w/ fairROM; no JVD; normal carotid impulses w/o bruits; no thyromegaly or nodules palpated; no lymphadenopathy. CHEST:  Clear to P & A; without wheezes/ rales/ or rhonchi heard... HEART:  Regular Rhythm; without murmurs/ rubs/ or gallops  detected... ABDOMEN:  Soft & nontender; normal bowel sounds; no organomegaly or masses palpated... EXT:  mod arthritic changes, walks w/ cane, +venous insuffic & tr edema., scattered varicose veins... NEURO:  CN's intact; motor testing normal; no focal deficits... DERM:   mild intertrig rash under breast, & onychomycosis of toenails...  RADIOLOGY DATA:  Reviewed in the EPIC EMR & discussed w/ the patient... CXR 4/13 showed normal heart size, clear lungs, DJD in TSpine...  LABORATORY DATA:  Reviewed in the EPIC EMR & discussed w/ the patient... LABS 2/13:  FLP- looks good on diet alone;  Chems- wnl w/ BS92 & A1c6.3;  CBC- wnl;  TSH- 2.86;  VitD- 46...   Assessment & Plan:    DYSPNEA>  Hx asthma, stable on Advair, Proair; hx anxiety component on Klonopin> symptoms better w/ regular use.  HBP>  Controlled on Norvasc, HCT, +diet etc...  RBBB>  Aware & denies CP, palpit, ch in DOE, etc...  CHOL>  On diet alone & FLP looks reasonable;  We reviewed low chol, low fat diet...  DM>  A1c is 6.3 on diet alone & we reviewed low carb no sweets etc...  GI> Indigestion, Divertics> she notes most bowel symptoms resolved off spicey food & uses PPI  just prn; she was eval by DrPerry.  UTI>  Klebsiella UTI resolved after Septra Rx... She has been eval by DrMacDiarmid.  DJD, LBP, Spinal Stenosis>  Prev evals by Ortho, DrRamos, DrNudelman etc; improved after THR w/ better ambulation...  Anxiety>  The Klonopin Bid helps...   Patient's Medications  New Prescriptions   No medications on file  Previous Medications   ALBUTEROL (PROAIR HFA) 108 (90 BASE) MCG/ACT INHALER    Inhale 2 puffs into the lungs 4 (four) times daily as needed.     ASPIRIN 81 MG TABLET    Take 81 mg by mouth daily.     CALCIUM CARBONATE (TUMS - DOSED IN MG ELEMENTAL CALCIUM) 500 MG CHEWABLE TABLET    Chew 1 tablet by mouth as needed.    CHOLECALCIFEROL (VITAMIN D3) 400 UNITS CAPS    Take 1 capsule by mouth daily.      DIPHENHYD-HYDROCORT-NYSTATIN (FIRST-DUKES MOUTHWASH) SUSP    1 tsp gargle and swallow four times daily as needed   FERROUS SULFATE DRIED (FEOSOL) 200 (65 FE) MG TABS    Take 1 tablet by mouth 2 (two) times daily. With Vit C 500mg  with each dose    FLUTICASONE-SALMETEROL (ADVAIR DISKUS) 100-50 MCG/DOSE AEPB    Inhale 1 puff into the lungs every 12 (twelve) hours as needed.    HYDROCHLOROTHIAZIDE 25 MG TABLET    Take 25 mg by mouth daily.     MENTHOL, TOPICAL ANALGESIC, (BIOFREEZE) 4 % GEL    As needed    MISC NATURAL PRODUCTS (OSTEO BI-FLEX ADV DOUBLE ST PO)    Take 1 tablet by mouth 2 (two) times daily.     MULTIPLE VITAMINS-MINERALS (CENTRUM SILVER PO)    Take 1 capsule by mouth daily.     NORVASC 5 MG TABLET    TAKE 1 TABLET ONCE DAILY.   OMEPRAZOLE (PRILOSEC) 20 MG CAPSULE    Take 1 capsule (20 mg total) by mouth daily.   POLYETHYLENE GLYCOL (MIRALAX / GLYCOLAX) PACKET    Take 17 g by mouth daily as needed.   Modified Medications   Modified Medication Previous Medication   CELECOXIB (CELEBREX) 200 MG CAPSULE celecoxib (CELEBREX) 200 MG capsule      Take 1 capsule (200 mg total) by mouth daily as needed for pain. Take one capsule daily as needed    Take one capsule daily as needed  Discontinued Medications   CLONAZEPAM (KLONOPIN) 0.5 MG TABLET    Take 1/2 to 1 tablet by mouth two times daily as directed   OMEPRAZOLE (PRILOSEC OTC) 20 MG TABLET    Take 1 capsule 30 minutes before breakfast for 1 month.   PANTOPRAZOLE (PROTONIX) 40 MG TABLET    Take 1 tablet (40 mg total) by mouth daily. Take 30 minutes before 1 st  Meal of the day

## 2012-05-25 ENCOUNTER — Other Ambulatory Visit: Payer: Self-pay | Admitting: Pulmonary Disease

## 2012-06-04 ENCOUNTER — Telehealth: Payer: Self-pay | Admitting: Pulmonary Disease

## 2012-06-04 NOTE — Telephone Encounter (Signed)
Spoke with pt's daughter-aware to follow GERD diet and stay off Ginger Ale as much since she noticed this is causing her flare ups. Also, patient is going to continue her laxative and stool softeners as directed as she is having bowel movements just not easy to have; pt's daughter is aware to call us if things don't improve.

## 2012-07-29 ENCOUNTER — Telehealth: Payer: Self-pay | Admitting: Pulmonary Disease

## 2012-07-29 ENCOUNTER — Encounter: Payer: Self-pay | Admitting: Adult Health

## 2012-07-29 ENCOUNTER — Ambulatory Visit (INDEPENDENT_AMBULATORY_CARE_PROVIDER_SITE_OTHER): Payer: Medicare Other | Admitting: Adult Health

## 2012-07-29 VITALS — BP 114/58 | HR 87 | Temp 97.1°F | Ht 67.5 in | Wt 167.2 lb

## 2012-07-29 DIAGNOSIS — J069 Acute upper respiratory infection, unspecified: Secondary | ICD-10-CM | POA: Insufficient documentation

## 2012-07-29 NOTE — Progress Notes (Signed)
Subjective:    Patient ID: [name], female    DOB: 1923-02-04, 78 y.o.   MRN: [id number]  HPI 78 y/o WF here for a follow up visit... she has multiple medical problems as noted below...  Followed for general medical purposes w/ hx chr obstructive asthma, HBP, RBBB, Hypercholesterolemia, borderline DM, DJD, LBP w/ sp stenosis, etc...  ~  May 14, 2011:  11mo ROV & she was seen 2wks ago by TP w/ acute bronchitis> CXR was clear, chr changes; given Augmentin & Mucinex- improved... She notes occas episodes of SOB "like I can't get a deep breath" & she improves if she sits outside (?claustrophobia); we discussed trial Klonopin 0.5mg  1/2 to 1 tab po bid regularly to treat this symptom... Also c/o ringing in ears & uses Biofreeze behind her ear for resolution;  C/o neck pain & has Celebrex to use as needed;  BP controlled on meds;  Bowels regular & doing satis;  Needs TDAP today & Fasting Labs>>  FLP fair on diet alone & she doesn't want meds for this; Chems look good w/ BS=93 & A1c=6.6 also on diet alone & reminded of low carb, no sweets etc...  ~  September 12, 2011:  25mo ROV & she is c/o hot flashes, feels it coming on & breaks out in a sweat> rec to try [employer];  Also c/o indigestion esp after she eats hot spicey foods (eg- pimento cheese pizza) & better if she eats bland, offer Sonar for screening purposes (Sonar & CTAbd in 2009 were neg) but she prefers rx noting that Tums helps, Rec to start PROTONIX 40mg /d 30' before a meal...    Chr Obstructive Asthma> stable on Advair100 & Proventil prn; notes breathing at baseline & Prn Klonopin helps dyspnea...    HBP> on Norvasc10, HCTZ25; BP=132/82, tol meds well; denies CP, palpit, ch in SOB, edema, etc...    CHOL> on diet alone, refuses meds, last LDL was 129 & she is content w/ this level of control...    DM> on diet alone, wt stable ~175#, last A1c was 6.6 & she knows to restrict carbs etc...    GI> indigestion as above + Divertics on Miralax,  Senakot-S etc...    DJD/ LBP> on Celebrex prn; had right THR 2011; known sp stenosis w/ prev ESI...  ~  January 14, 2012:  25mo ROV & she reports intermittent dyspnea in spells usually when daugh leaves and she is alone, worse at night, uses ceiling fan or sits on porch to help; we discussed this anxiety related dyspnea & she is asked to take the [facility] regularly 1/2 to 1 tab Bid; breathing otherw stable on Advair100 & Albut HFA prn...  BP & CV appear well controlled on Norvasc & HCTZ... She notes prev indigestion is improved off spicy foods and she doesn't need the PPI Rx she says; she still recommends peppermint tea & sauerkraut to friends for bowel regularity...  Problem list reviewed & f/u LABS today look good- see below>>  ~  February 21, 2012:  6wk ROV & her CC is tired all the time "ever since the UTI" (Klebs grew 3/13 & treated w/ Septra); requested to repeat UA & C&S to be sure it's eradicated; Dyspnea is better on the Klonopin Rx but she persist w/ mult minor somatic complaints noting incr Ufreq (UTI treated), subjective numbness in right leg (DrAlusio checked right leg & hip), excess gas w/ belching (rec to take PPI daily), sl hoarse (rec MMW), etc..[name]  See prob list below>> CXR 4/13 showed normal heart size, clear lungs, DJD in TSpine...  ~  May 13, 2012:  75mo ROV & she has had multiple subspecialty visits in the interval (see below)> in addition she is c/o "my blood veins are coming back out" noting some swelling in the legs but she can't wear any support hose due to discomfort- I explained that she is then relegated to no salt & elevation for control...    She saw GI> DrPerry & PGuenter in April/May for indigestion; she had mult complaints, most improved w/ Prilosec.[name]     She saw DrMacDiarmid 5/13 for Urology f/u of her recurrent UTIs, chronic cystitis, urge & stress incont, nocturia> hx intol Cipro/ Levaquin, she did tol Septra, he is following her cultures & discussed asymptomatic bacturia w/  them...    She saw DrRamos 5/13 for leg pain> he felt her leg discomfort was coming from lumbar DDD w/ spinal stenosis at L3-5 & L4-5; he gave her another [facility] & continues to follow her progress; she had seen DrNudelman several yrs ago as well... We reviewed prob list, meds, xrays and labs> see below>>  07/29/2012 Acute OV  Complains of sore/raw throat, hoarseness/laryngitis, prod cough with light yellow mucus, some wheezing and SOB, weakness/loss of appetite, chills x5days . Lost voice initially then yesterday starting coming back.  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  No fever . No hemoptysis.  No n/v/d. No edema. No chest pain  Has been having occasional reflux despite prilosec at time over last 2 day.  No choking .  Cough and congestion seem to be getting better. Mucus is more clear x 2 days.    Problem List:       DYSPNEA (ICD-786.05) - long hx of chronic obstructive asthma treated w/ ADVAIR100Bid & PROAIR (she uses them Prn now)... she is a non-smoker w/ some reactive airways disease in the past & retired from [employer] after 33 years in 1991... she denies cough, sputum, hemoptysis, worsening dyspnea, wheezing, chest pains, snoring, daytime hypersomnolence, etc...  ~  baseline CXR w/o acute changes...  ~  PFT's 5/02 w/ FVC 2.07 (68%), FEV1=1.36 (57%), and FEV1/FVC ratio=66%, mid-flows 42%... ~  CT Angio 7/10 was neg- x biapical pleuroparenchymal scarring... ~  CXR 10/11 showed sl elev right hemidaiph, mild DJD sp, osteopenia, NAD.[name]. ~  CXR 6/12 showed mild apical scarring, clear & NAD, DJD sp w/ osteophytes... ~  Intermittent dyspnea more related to anxiety & treated w/ KLONOPIN 0.5mg  1/2 to 1 tab Bid==> improved. ~  CXR 4/13 showed normal heart size, clear lungs, DJD in TSpine...  HYPERTENSION (ICD-401.9) - controlled on NORVASC 5mg  daily & HCTZ 25mg tab daily...  ~  2/13:  BP 144/70 today> tol rx well & denies HA, visual changes, CP, palipit, dizziness, syncope, edema, etc... ~  4/13:   BP= 148/80 & she denies CP, palpit, edema; dyspnea improved w/ Klonopin. ~  6/13:  BP= 132/68 & as noted she has mult somatic complaints...  RIGHT BUNDLE BRANCH BLOCK (ICD-426.4) - on ASA 81mg /d... baseline EKG w/ RBBB and 2DEcho 5/02 showed mild asymmetric LVH w/ incr EF...  HYPERLIPIDEMIA (ICD-272.4) - on diet alone... she forgets to come to visits FASTING for this blood work ~  FLP 8/07 showed TChol 205, TG 71, HDL 49, LDL 130... ~  FLP 6/12 on diet alone showed TChol 218, TG 50, HDL 66, LDL 129 ~  FLP 2/13 on diet alone showed TChol 171, TG 43, HDL 66, LDL 97  DIABETES MELLITUS, BORDERLINE (ICD-790.29) - on diet alone w/ prev BS's in the 100-160 range... ~  labs in 2008-9 showed BS= 101 to 108 ~  labs 1/10 showed BS= 106, A1c= 5.9 ~  Labs 6/12 showed BS= 93, A1c= 6.6.Marland KitchenMarland Kitchen rec diet, exercise... ~  Labs 2/13 showed BS= 92, A1c= 6.3  INDIGESTION/ REFLUX SYMPTOMS >> see 10/12 note & PROTONIX 40mg /d started, further eval if symptoms persist... ~  4/13:  She notes some reflux symptoms and excess gas w/ belching; rec to take the Protonix daily & Simethacone vs Tums which she says helps her gas. ~  5/13:  She saw GI DrPerry w/ rec to take Prilosec for her indigestion...  DIVERTICULOSIS OF COLON (ICD-562.10) - she takes SENAKOT-S, MIRALAX, Peppermint Tea, & sauerkraut Prn...last colonoscopy 9/02 by DrPerry was WNL...  PYELONEPHRITIS (ICD-590.80) - SEE 1/09 Hospitalization (reviewed)... ~  She saw DrMacDiarmid for her recurrent UTIs, chronic cystitis, urge & stress incont, nocturia; she is INTOL to Cipro & Levaquin...  Hx of BREAST CYST (ICD-610.0)  DEGENERATIVE JOINT DISEASE (ICD-715.90) - s/p right hip hemiarthroplasty 11/09 by DrAplington w/ wound complic... then dx w/ loosening of the femoral shaft & had conversion to right THR by DrAlusio 10/11 & much improved... she uses CELEBREX 200mg  Prn (seldom takes this).  LOW BACK PAIN SYNDROME (ICD-724.2) & SPINAL STENOSIS (ICD-724.00) - severe LBP  & spinal stenosis w/ evals by DrRamos & DrNudelman... s/p shots, considering poss surgery vs alternative therapies... she takes Celebrex, Osteobiflex, MVI, Vit D... ~  8/10: eval by DrAplington- diff leg lengths, lift placed in right shoe, then trial Lyrica50mg ... ~  12/11:  improved after hip revision surg (to THR) 10/11 w/ better ambulaton... ~  5/13:  DrRamos gave her another ESI for her leg pain related to sp stenosis...  Hx of ANEMIA (ICD-285.9) - eval by GI in 2002 showed normal EGD and Colon... prob iron malabsorption problem Rx'd w/ Fe infusion... ~  labs 1/10 showed Hg= 14.6, MCV= 89, Fe= 94 ~  labs 12/11 showed Hg= 12.8, MCV= 90, Fe= 33... try Fe supplement + VitC... ~  Labs 6/12 showed Hg= 15.0 ~  Labs 2/13 showed Hg= 14.6  DERM:  rash Rx'd by dermatology- OLUX-E foam= clobetasol Foam 0.05%...   Past Surgical History  Procedure Date  . Cataract extraction   . Right hip hemiarthroplasty   . Conversion to right thr     Outpatient Encounter Prescriptions as of 07/29/2012  Medication Sig Dispense Refill  . albuterol (PROAIR HFA) 108 (90 BASE) MCG/ACT inhaler Inhale 2 puffs into the lungs 4 (four) times daily as needed.        [name] aspirin 81 MG tablet Take 81 mg by mouth daily.        . celecoxib (CELEBREX) 200 MG capsule Take 1 capsule (200 mg total) by mouth daily as needed for pain. Take one capsule daily as needed  30 capsule  11  . Cholecalciferol (VITAMIN D3) 400 UNITS CAPS Take 1 capsule by mouth daily.        . Diphenhyd-Hydrocort-Nystatin (FIRST-DUKES MOUTHWASH) SUSP 1 tsp gargle and swallow four times daily as needed  120 mL  5  . Ferrous Sulfate Dried (FEOSOL) 200 (65 FE) MG TABS Take 1 tablet by mouth 2 (two) times daily. With Vit C 500mg  with each dose       . Fluticasone-Salmeterol (ADVAIR DISKUS) 100-50 MCG/DOSE AEPB Inhale 1 puff into the lungs every 12 (twelve) hours as needed.       [name]  hydrochlorothiazide (HYDRODIURIL) 25 MG tablet TAKE 1 TABLET ONCE DAILY.  30  tablet  6  . Menthol, Topical Analgesic, (BIOFREEZE) 4 % GEL As needed       . Misc Natural Products (OSTEO BI-FLEX ADV DOUBLE ST PO) Take 1 tablet by mouth daily.       . Multiple Vitamins-Minerals (CENTRUM SILVER PO) Take 1 capsule by mouth daily.        . NORVASC 5 MG tablet TAKE 1 TABLET ONCE DAILY.  60 each  11  . omeprazole (PRILOSEC) 20 MG capsule Take 1 capsule (20 mg total) by mouth daily.  30 capsule  11  . polyethylene glycol (MIRALAX / GLYCOLAX) packet Take 17 g by mouth daily as needed.       . calcium carbonate (TUMS - DOSED IN MG ELEMENTAL CALCIUM) 500 MG chewable tablet Chew 1 tablet by mouth as needed.         Allergies  Allergen Reactions  . Azithromycin     Trouble breathing  . Ciprofloxacin     REACTION: hallucinations  . Latex     rash  . Levaquin (Levofloxacin In D5w)     Causes pt unable to sleep, legs are tingling, severe indigestion.      Current Medications, Allergies, Past Medical History, Past Surgical History, Family History, and Social History were reviewed in [employer] record.    Review of Systems         Constitutional:   No  weight loss, night sweats,  Fevers, chills, + fatigue, or  lassitude.  HEENT:   No headaches,  Difficulty swallowing,  Tooth/dental problems,  +sore throat,                No sneezing, itching, ear ache,  +nasal congestion, post nasal drip,   CV:  No chest pain,  Orthopnea, PND, swelling in lower extremities, anasarca, dizziness, palpitations, syncope.   GI  No heartburn, indigestion, abdominal pain, nausea, vomiting, diarrhea, change in bowel habits, loss of appetite, bloody stools.   Resp: No shortness of breath with exertion or at rest.    No coughing up of blood.    No wheezing.  No chest wall deformity  Skin: no rash or lesions.  GU: no dysuria, change in color of urine, no urgency or frequency.  No flank pain, no hematuria   MS:  No joint pain or swelling.  No decreased range of motion.     Psych:  No change in mood or affect. No depression or anxiety.  No memory loss.       Objective:   Physical Exam     WD, WN, elderly 78 y/o WF in NAD... GENERAL:  Alert & oriented; pleasant & cooperative... HEENT:  [place]/AT,   EACs-clear, TMs-wnl, NOSE-clear drainage , THROAT-clear & wnl. NECK:  Supple w/ fairROM; no JVD; normal carotid impulses w/o bruits; no thyromegaly or nodules palpated; no lymphadenopathy. CHEST:  Clear to P & A; without wheezes/ rales/ or rhonchi heard... HEART:  Regular Rhythm; without murmurs/ rubs/ or gallops detected... ABDOMEN:  Soft & nontender; normal bowel sounds; no organomegaly or masses palpated... EXT:  mod arthritic changes, walks w/ cane, +venous insuffic & tr edema., scattered varicose veins... NEURO:   no focal deficits... DERM:   No rash     Assessment & Plan:

## 2012-07-29 NOTE — Patient Instructions (Addendum)
Mucinex DM  Twice daily  As needed  Cough/congestion  Fluids and rest.  Tylenol As needed   Salt water gargles As needed   May add Pepcid 20mg  At bedtime  As needed  Heartburn.  Claritin 10mg  daily As needed  Drainage  Please contact office for sooner follow up if symptoms do not improve or worsen or seek emergency care

## 2012-07-29 NOTE — Telephone Encounter (Signed)
I spoke with daughter and she stated pt throat is sore/raw. The raw feeling is also down in ehr chest. Pt is coming this afternoon to see TP at 2:15 for an eval.

## 2012-07-29 NOTE — Assessment & Plan Note (Signed)
Resolving URI -   Mucinex DM  Twice daily  As needed  Cough/congestion  Fluids and rest.  Tylenol As needed   Salt water gargles As needed   May add Pepcid 20mg  At bedtime  As needed  Heartburn.  Claritin 10mg  daily As needed  Drainage  Please contact office for sooner follow up if symptoms do not improve or worsen or seek emergency care

## 2012-09-15 ENCOUNTER — Encounter: Payer: Self-pay | Admitting: *Deleted

## 2012-09-16 ENCOUNTER — Ambulatory Visit (INDEPENDENT_AMBULATORY_CARE_PROVIDER_SITE_OTHER): Payer: Medicare Other | Admitting: Pulmonary Disease

## 2012-09-16 ENCOUNTER — Encounter: Payer: Self-pay | Admitting: Pulmonary Disease

## 2012-09-16 VITALS — BP 158/70 | HR 88 | Temp 96.9°F | Ht 68.0 in | Wt 171.2 lb

## 2012-09-16 DIAGNOSIS — E785 Hyperlipidemia, unspecified: Secondary | ICD-10-CM

## 2012-09-16 DIAGNOSIS — K219 Gastro-esophageal reflux disease without esophagitis: Secondary | ICD-10-CM

## 2012-09-16 DIAGNOSIS — Z23 Encounter for immunization: Secondary | ICD-10-CM

## 2012-09-16 DIAGNOSIS — M1990 Unspecified osteoarthritis, unspecified site: Secondary | ICD-10-CM

## 2012-09-16 DIAGNOSIS — F419 Anxiety disorder, unspecified: Secondary | ICD-10-CM

## 2012-09-16 DIAGNOSIS — I4510 Unspecified right bundle-branch block: Secondary | ICD-10-CM

## 2012-09-16 DIAGNOSIS — R7309 Other abnormal glucose: Secondary | ICD-10-CM

## 2012-09-16 DIAGNOSIS — N390 Urinary tract infection, site not specified: Secondary | ICD-10-CM

## 2012-09-16 DIAGNOSIS — I10 Essential (primary) hypertension: Secondary | ICD-10-CM

## 2012-09-16 DIAGNOSIS — M545 Low back pain, unspecified: Secondary | ICD-10-CM

## 2012-09-16 DIAGNOSIS — D649 Anemia, unspecified: Secondary | ICD-10-CM

## 2012-09-16 DIAGNOSIS — K5730 Diverticulosis of large intestine without perforation or abscess without bleeding: Secondary | ICD-10-CM

## 2012-09-16 DIAGNOSIS — K5900 Constipation, unspecified: Secondary | ICD-10-CM

## 2012-09-16 DIAGNOSIS — R0602 Shortness of breath: Secondary | ICD-10-CM

## 2012-09-16 NOTE — Patient Instructions (Addendum)
Today we updated your med list in our EPIC system...    Continue your current medications the same...  We gave you the Flu shot today...  Keep up the good work w/ diet (no salt) & exercise daily...  Call for any questions...  Let's plan a follow up visit in 4-61mo w/ FASTING blood work at that time.Marland KitchenMarland Kitchen

## 2012-09-22 ENCOUNTER — Other Ambulatory Visit: Payer: Self-pay | Admitting: Pulmonary Disease

## 2012-09-28 ENCOUNTER — Encounter: Payer: Self-pay | Admitting: Pulmonary Disease

## 2012-09-28 NOTE — Progress Notes (Signed)
Subjective:    Patient ID: [name], female    DOB: 1923-02-04, 78 y.o.   MRN: [id number]  HPI 78 y/o WF here for a follow up visit... she has multiple medical problems as noted below...  Followed for general medical purposes w/ hx chr obstructive asthma, HBP, RBBB, Hypercholesterolemia, borderline DM, DJD, LBP w/ sp stenosis, etc...  ~  May 14, 2011:  11mo ROV & she was seen 2wks ago by TP w/ acute bronchitis> CXR was clear, chr changes; given Augmentin & Mucinex- improved... She notes occas episodes of SOB "like I can't get a deep breath" & she improves if she sits outside (?claustrophobia); we discussed trial Klonopin 0.5mg  1/2 to 1 tab po bid regularly to treat this symptom... Also c/o ringing in ears & uses Biofreeze behind her ear for resolution;  C/o neck pain & has Celebrex to use as needed;  BP controlled on meds;  Bowels regular & doing satis;  Needs TDAP today & Fasting Labs>>  FLP fair on diet alone & she doesn't want meds for this; Chems look good w/ BS=93 & A1c=6.6 also on diet alone & reminded of low carb, no sweets etc...  ~  September 12, 2011:  25mo ROV & she is c/o hot flashes, feels it coming on & breaks out in a sweat> rec to try [employer];  Also c/o indigestion esp after she eats hot spicey foods (eg- pimento cheese pizza) & better if she eats bland, offer Sonar for screening purposes (Sonar & CTAbd in 2009 were neg) but she prefers rx noting that Tums helps, Rec to start PROTONIX 40mg /d 30' before a meal...    Chr Obstructive Asthma> stable on Advair100 & Proventil prn; notes breathing at baseline & Prn Klonopin helps dyspnea...    HBP> on Norvasc10, HCTZ25; BP=132/82, tol meds well; denies CP, palpit, ch in SOB, edema, etc...    CHOL> on diet alone, refuses meds, last LDL was 129 & she is content w/ this level of control...    DM> on diet alone, wt stable ~175#, last A1c was 6.6 & she knows to restrict carbs etc...    GI> indigestion as above + Divertics on Miralax,  Senakot-S etc...    DJD/ LBP> on Celebrex prn; had right THR 2011; known sp stenosis w/ prev ESI...  ~  January 14, 2012:  25mo ROV & she reports intermittent dyspnea in spells usually when daugh leaves and she is alone, worse at night, uses ceiling fan or sits on porch to help; we discussed this anxiety related dyspnea & she is asked to take the [facility] regularly 1/2 to 1 tab Bid; breathing otherw stable on Advair100 & Albut HFA prn...  BP & CV appear well controlled on Norvasc & HCTZ... She notes prev indigestion is improved off spicy foods and she doesn't need the PPI Rx she says; she still recommends peppermint tea & sauerkraut to friends for bowel regularity...  Problem list reviewed & f/u LABS today look good- see below>>  ~  February 21, 2012:  6wk ROV & her CC is tired all the time "ever since the UTI" (Klebs grew 3/13 & treated w/ Septra); requested to repeat UA & C&S to be sure it's eradicated; Dyspnea is better on the Klonopin Rx but she persist w/ mult minor somatic complaints noting incr Ufreq (UTI treated), subjective numbness in right leg (DrAlusio checked right leg & hip), excess gas w/ belching (rec to take PPI daily), sl hoarse (rec MMW), etc..[name]  See prob list below>> CXR 4/13 showed normal heart size, clear lungs, DJD in TSpine...  ~  May 13, 2012:  9mo ROV & she has had multiple subspecialty visits in the interval (see below)> in addition she is c/o "my blood veins are coming back out" noting some swelling in the legs but she can't wear any support hose due to discomfort- I explained that she is then relegated to no salt & elevation for control...    She saw GI> DrPerry & PGuenter in April/May for indigestion; she had mult complaints, most improved w/ Prilosec.[name]     She saw DrMacDiarmid 5/13 for Urology f/u of her recurrent UTIs, chronic cystitis, urge & stress incont, nocturia> hx intol Cipro/ Levaquin, she did tol Septra, he is following her cultures & discussed asymptomatic bacturia w/  them...    She saw DrRamos 5/13 for leg pain> he felt her leg discomfort was coming from lumbar DDD w/ spinal stenosis at L3-5 & L4-5; he gave her another [facility] & continues to follow her progress; she had seen DrNudelman several yrs ago as well... We reviewed prob list, meds, xrays and labs> see below>>  ~  September 16, 2012:  53mo ROV & [name] notes sl better w/ her exercise program, notes DOE but mowing on her riding mower etc;  Stable on her Advair100, ProairHFA, MMW... BP controlled on meds, Chol & DM controlled on diet alone, GI & GU stable and she saw Urology, DrMacDiarmid- hx recurrent UTIs, chr cystitis, urge&stress incont... She also has DJD, LBP, spinal stenosis w/ ESI L5-S1 from DrRamos but not much relief she says & uses Celebrex prn... Hx panic attacks that required her to go outside for "air" & notes the "prilosec" helps ?!*    We reviewed prob list, meds, xrays and labs> see below for updates >> OK Flu shot today...         Problem List:       DYSPNEA (ICD-786.05) - long hx of chronic obstructive asthma treated w/ ADVAIR100Bid & PROAIR (she uses them Prn now)... she is a non-smoker w/ some reactive airways disease in the past & retired from [employer] after 33 years in 1991... she denies cough, sputum, hemoptysis, worsening dyspnea, wheezing, chest pains, snoring, daytime hypersomnolence, etc...  ~  baseline CXR w/o acute changes...  ~  PFT's 5/02 w/ FVC 2.07 (68%), FEV1=1.36 (57%), and FEV1/FVC ratio=66%, mid-flows 42%... ~  CT Angio 7/10 was neg- x biapical pleuroparenchymal scarring... ~  CXR 10/11 showed sl elev right hemidaiph, mild DJD sp, osteopenia, NAD.[name]. ~  CXR 6/12 showed mild apical scarring, clear & NAD, DJD sp w/ osteophytes... ~  Intermittent dyspnea more related to anxiety & treated w/ KLONOPIN 0.5mg  1/2 to 1 tab Bid==> improved. ~  CXR 4/13 showed normal heart size, clear lungs, DJD in TSpine...  HYPERTENSION (ICD-401.9) - controlled on NORVASC 5mg  daily & HCTZ 25mg tab  daily...  ~  2/13:  BP 144/70 today> tol rx well & denies HA, visual changes, CP, palipit, dizziness, syncope, edema, etc... ~  4/13:  BP= 148/80 & she denies CP, palpit, edema; dyspnea improved w/ Klonopin. ~  6/13:  BP= 132/68 & as noted she has mult somatic complaints...  RIGHT BUNDLE BRANCH BLOCK (ICD-426.4) - on ASA 81mg /d... baseline EKG w/ RBBB and 2DEcho 5/02 showed mild asymmetric LVH w/ incr EF...  HYPERLIPIDEMIA (ICD-272.4) - on diet alone... she forgets to come to visits FASTING for this blood work ~  FLP 8/07 showed TChol 205, TG  71, HDL 49, LDL 130... ~  FLP 6/12 on diet alone showed TChol 218, TG 50, HDL 66, LDL 129 ~  FLP 2/13 on diet alone showed TChol 171, TG 43, HDL 66, LDL 97  DIABETES MELLITUS, BORDERLINE (ICD-790.29) - on diet alone w/ prev BS's in the 100-160 range... ~  labs in 2008-9 showed BS= 101 to 108 ~  labs 1/10 showed BS= 106, A1c= 5.9 ~  Labs 6/12 showed BS= 93, A1c= 6.6.Marland KitchenMarland Kitchen rec diet, exercise... ~  Labs 2/13 showed BS= 92, A1c= 6.3  INDIGESTION/ REFLUX SYMPTOMS >> see 10/12 note & PROTONIX 40mg /d started, further eval if symptoms persist... ~  4/13:  She notes some reflux symptoms and excess gas w/ belching; rec to take the Protonix daily & Simethacone vs Tums which she says helps her gas. ~  5/13:  She saw GI DrPerry w/ rec to take Prilosec for her indigestion...  DIVERTICULOSIS OF COLON (ICD-562.10) - she takes SENAKOT-S, MIRALAX, Peppermint Tea, & sauerkraut Prn...last colonoscopy 9/02 by DrPerry was WNL...  PYELONEPHRITIS (ICD-590.80) - SEE 1/09 Hospitalization (reviewed)... ~  She saw DrMacDiarmid for her recurrent UTIs, chronic cystitis, urge & stress incont, nocturia; she is INTOL to Cipro & Levaquin...  Hx of BREAST CYST (ICD-610.0)  DEGENERATIVE JOINT DISEASE (ICD-715.90) - s/p right hip hemiarthroplasty 11/09 by DrAplington w/ wound complic... then dx w/ loosening of the femoral shaft & had conversion to right THR by DrAlusio 10/11 & much  improved... she uses CELEBREX 200mg  Prn (seldom takes this).  LOW BACK PAIN SYNDROME (ICD-724.2) & SPINAL STENOSIS (ICD-724.00) - severe LBP & spinal stenosis w/ evals by DrRamos & DrNudelman... s/p shots, considering poss surgery vs alternative therapies... she takes Celebrex, Osteobiflex, MVI, Vit D... ~  8/10: eval by DrAplington- diff leg lengths, lift placed in right shoe, then trial Lyrica50mg ... ~  12/11:  improved after hip revision surg (to THR) 10/11 w/ better ambulaton... ~  5/13:  DrRamos gave her another ESI for her leg pain related to sp stenosis...  Hx of ANEMIA (ICD-285.9) - eval by GI in 2002 showed normal EGD and Colon... prob iron malabsorption problem Rx'd w/ Fe infusion... ~  labs 1/10 showed Hg= 14.6, MCV= 89, Fe= 94 ~  labs 12/11 showed Hg= 12.8, MCV= 90, Fe= 33... try Fe supplement + VitC... ~  Labs 6/12 showed Hg= 15.0 ~  Labs 2/13 showed Hg= 14.6  DERM:  rash Rx'd by dermatology- OLUX-E foam= clobetasol Foam 0.05%...   Past Surgical History  Procedure Date  . Cataract extraction   . Right hip hemiarthroplasty   . Conversion to right thr     Outpatient Encounter Prescriptions as of 09/16/2012  Medication Sig Dispense Refill  . albuterol (PROAIR HFA) 108 (90 BASE) MCG/ACT inhaler Inhale 2 puffs into the lungs 4 (four) times daily as needed.        [name] aspirin 81 MG tablet Take 81 mg by mouth daily.        . calcium carbonate (TUMS - DOSED IN MG ELEMENTAL CALCIUM) 500 MG chewable tablet Chew 1 tablet by mouth as needed.       . celecoxib (CELEBREX) 200 MG capsule Take 1 capsule (200 mg total) by mouth daily as needed for pain. Take one capsule daily as needed  30 capsule  11  . Cholecalciferol (VITAMIN D3) 400 UNITS CAPS Take 1 capsule by mouth daily.        . Diphenhyd-Hydrocort-Nystatin (FIRST-DUKES MOUTHWASH) SUSP 1 tsp gargle and swallow four times daily  as needed  120 mL  5  . Ferrous Sulfate Dried (FEOSOL) 200 (65 FE) MG TABS Take 1 tablet by mouth 2 (two)  times daily. With Vit C 500mg  with each dose       . Fluticasone-Salmeterol (ADVAIR DISKUS) 100-50 MCG/DOSE AEPB Inhale 1 puff into the lungs every 12 (twelve) hours as needed.       . hydrochlorothiazide (HYDRODIURIL) 25 MG tablet TAKE 1 TABLET ONCE DAILY.  30 tablet  6  . Menthol, Topical Analgesic, (BIOFREEZE) 4 % GEL As needed       . Multiple Vitamins-Minerals (CENTRUM SILVER PO) Take 1 capsule by mouth daily.        . NORVASC 5 MG tablet TAKE 1 TABLET ONCE DAILY.  60 each  11  . omeprazole (PRILOSEC) 20 MG capsule Take 1 capsule (20 mg total) by mouth daily.  30 capsule  11  . polyethylene glycol (MIRALAX / GLYCOLAX) packet Take 17 g by mouth daily as needed.       . Misc Natural Products (OSTEO BI-FLEX ADV DOUBLE ST PO) Take 1 tablet by mouth daily.         Allergies  Allergen Reactions  . Azithromycin     Trouble breathing  . Ciprofloxacin     REACTION: hallucinations  . Latex     rash  . Levaquin (Levofloxacin In D5w)     Causes pt unable to sleep, legs are tingling, severe indigestion.      Current Medications, Allergies, Past Medical History, Past Surgical History, Family History, and Social History were reviewed in [employer] record.    Review of Systems         See HPI - all other systems neg except as noted... The patient complains of decreased hearing, dyspnea on exertion, muscle weakness, and difficulty walking.  The patient denies anorexia, fever, weight loss, weight gain, vision loss, hoarseness, chest pain, syncope, peripheral edema, prolonged cough, headaches, hemoptysis, abdominal pain, melena, hematochezia, severe indigestion/heartburn, hematuria, incontinence, suspicious skin lesions, transient blindness, depression, unusual weight change, abnormal bleeding, enlarged lymph nodes, and angioedema.     Objective:   Physical Exam     WD, WN, Chr ill appearing 78 y/o WF in NAD... GENERAL:  Alert & oriented; pleasant &  cooperative... HEENT:  [place]/AT, EOM-full, EACs-clear, TMs-wnl, NOSE-clear, THROAT-clear & wnl. NECK:  Supple w/ fairROM; no JVD; normal carotid impulses w/o bruits; no thyromegaly or nodules palpated; no lymphadenopathy. CHEST:  Clear to P & A; without wheezes/ rales/ or rhonchi heard... HEART:  Regular Rhythm; without murmurs/ rubs/ or gallops detected... ABDOMEN:  Soft & nontender; normal bowel sounds; no organomegaly or masses palpated... EXT:  mod arthritic changes, walks w/ cane, +venous insuffic & tr edema., scattered varicose veins... NEURO:  CN's intact; motor testing normal; no focal deficits... DERM:   mild intertrig rash under breast, & onychomycosis of toenails...  RADIOLOGY DATA:  Reviewed in the EPIC EMR & discussed w/ the patient...  LABORATORY DATA:  Reviewed in the EPIC EMR & discussed w/ the patient...   Assessment & Plan:    DYSPNEA>  Hx asthma, stable on Advair, Proair; hx anxiety component on Klonopin> symptoms better w/ regular use.  HBP>  Controlled on Norvasc, HCT, +diet etc...  RBBB>  Aware & denies CP, palpit, ch in DOE, etc...  CHOL>  On diet alone & FLP looks reasonable;  We reviewed low chol, low fat diet...  DM>  A1c is 6.3 on diet alone & we  reviewed low carb no sweets etc...  GI> Indigestion, Divertics> she notes most bowel symptoms resolved off spicey food & uses PPI just prn; she was eval by DrPerry.  UTI>  Klebsiella UTI resolved after Septra Rx... She has been eval by DrMacDiarmid.  DJD, LBP, Spinal Stenosis>  Prev evals by Ortho, DrRamos, DrNudelman etc; improved after THR w/ better ambulation...  Anxiety>  The Klonopin Bid helps...   Patient's Medications  New Prescriptions   PANTOPRAZOLE (PROTONIX) 40 MG TABLET    TAKE 1 TABLET 30 MINUTES BEFORE 1ST MEAL OF THE DAY.  Previous Medications   ALBUTEROL (PROAIR HFA) 108 (90 BASE) MCG/ACT INHALER    Inhale 2 puffs into the lungs 4 (four) times daily as needed.     ASPIRIN 81 MG TABLET    Take  81 mg by mouth daily.     CALCIUM CARBONATE (TUMS - DOSED IN MG ELEMENTAL CALCIUM) 500 MG CHEWABLE TABLET    Chew 1 tablet by mouth as needed.    CELECOXIB (CELEBREX) 200 MG CAPSULE    Take 1 capsule (200 mg total) by mouth daily as needed for pain. Take one capsule daily as needed   CHOLECALCIFEROL (VITAMIN D3) 400 UNITS CAPS    Take 1 capsule by mouth daily.     DIPHENHYD-HYDROCORT-NYSTATIN (FIRST-DUKES MOUTHWASH) SUSP    1 tsp gargle and swallow four times daily as needed   FERROUS SULFATE DRIED (FEOSOL) 200 (65 FE) MG TABS    Take 1 tablet by mouth 2 (two) times daily. With Vit C 500mg  with each dose    FLUTICASONE-SALMETEROL (ADVAIR DISKUS) 100-50 MCG/DOSE AEPB    Inhale 1 puff into the lungs every 12 (twelve) hours as needed.    HYDROCHLOROTHIAZIDE (HYDRODIURIL) 25 MG TABLET    TAKE 1 TABLET ONCE DAILY.   MENTHOL, TOPICAL ANALGESIC, (BIOFREEZE) 4 % GEL    As needed    MISC NATURAL PRODUCTS (OSTEO BI-FLEX ADV DOUBLE ST PO)    Take 1 tablet by mouth daily.    MULTIPLE VITAMINS-MINERALS (CENTRUM SILVER PO)    Take 1 capsule by mouth daily.     NORVASC 5 MG TABLET    TAKE 1 TABLET ONCE DAILY.   OMEPRAZOLE (PRILOSEC) 20 MG CAPSULE    Take 1 capsule (20 mg total) by mouth daily.   POLYETHYLENE GLYCOL (MIRALAX / GLYCOLAX) PACKET    Take 17 g by mouth daily as needed.   Modified Medications   No medications on file  Discontinued Medications   No medications on file

## 2012-11-26 ENCOUNTER — Other Ambulatory Visit: Payer: Self-pay | Admitting: Pulmonary Disease

## 2012-11-26 MED ORDER — HYDROCHLOROTHIAZIDE 25 MG PO TABS: ORAL_TABLET | ORAL | Status: DC

## 2012-12-24 ENCOUNTER — Ambulatory Visit (INDEPENDENT_AMBULATORY_CARE_PROVIDER_SITE_OTHER)
Admission: RE | Admit: 2012-12-24 | Discharge: 2012-12-24 | Disposition: A | Discharge status: Home / Self Care | Payer: Medicare Other | Source: Ambulatory Visit | Attending: Adult Health | Admitting: Adult Health

## 2012-12-24 ENCOUNTER — Ambulatory Visit (INDEPENDENT_AMBULATORY_CARE_PROVIDER_SITE_OTHER): Payer: Medicare Other | Admitting: Adult Health

## 2012-12-24 ENCOUNTER — Other Ambulatory Visit (INDEPENDENT_AMBULATORY_CARE_PROVIDER_SITE_OTHER): Payer: Medicare Other

## 2012-12-24 ENCOUNTER — Encounter: Payer: Self-pay | Admitting: Adult Health

## 2012-12-24 VITALS — HR 79 | Temp 97.3°F | Ht 67.5 in | Wt 174.0 lb

## 2012-12-24 DIAGNOSIS — K219 Gastro-esophageal reflux disease without esophagitis: Secondary | ICD-10-CM

## 2012-12-24 DIAGNOSIS — R300 Dysuria: Secondary | ICD-10-CM

## 2012-12-24 DIAGNOSIS — R0602 Shortness of breath: Secondary | ICD-10-CM

## 2012-12-24 LAB — CBC WITH DIFFERENTIAL/PLATELET
Basophils Absolute: 0 10*3/uL (ref 0.0–0.1)
Basophils Relative: 0.6 % (ref 0.0–3.0)
Eosinophils Absolute: 0.1 10*3/uL (ref 0.0–0.7)
Eosinophils Relative: 2.2 % (ref 0.0–5.0)
HCT: 43.5 % (ref 36.0–46.0)
Hemoglobin: 14.8 g/dL (ref 12.0–15.0)
Lymphocytes Relative: 37.7 % (ref 12.0–46.0)
Lymphs Abs: 2 10*3/uL (ref 0.7–4.0)
MCHC: 33.9 g/dL (ref 30.0–36.0)
MCV: 89.8 fl (ref 78.0–100.0)
Monocytes Absolute: 0.5 10*3/uL (ref 0.1–1.0)
Monocytes Relative: 9 % (ref 3.0–12.0)
Neutro Abs: 2.6 10*3/uL (ref 1.4–7.7)
Neutrophils Relative %: 50.5 % (ref 43.0–77.0)
Platelets: 165 10*3/uL (ref 150.0–400.0)
RBC: 4.84 Mil/uL (ref 3.87–5.11)
RDW: 13.1 % (ref 11.5–14.6)
WBC: 5.2 10*3/uL (ref 4.5–10.5)

## 2012-12-24 LAB — URINALYSIS, ROUTINE W REFLEX MICROSCOPIC
Bilirubin Urine: NEGATIVE
Hgb urine dipstick: NEGATIVE
Leukocytes, UA: NEGATIVE
Nitrite: NEGATIVE
Specific Gravity, Urine: 1.015 (ref 1.000–1.030)
Total Protein, Urine: NEGATIVE
Urine Glucose: NEGATIVE
Urobilinogen, UA: 0.2 (ref 0.0–1.0)
pH: 6 (ref 5.0–8.0)

## 2012-12-24 LAB — BASIC METABOLIC PANEL
Calcium: 10 mg/dL (ref 8.4–10.5)
Creatinine, Ser: 1.1 mg/dL (ref 0.4–1.2)
GFR: 48.31 mL/min — ABNORMAL LOW (ref >60.00)
Glucose, Bld: 108 mg/dL — ABNORMAL HIGH (ref 70–99)
Sodium: 140 mEq/L (ref 135–145)

## 2012-12-24 NOTE — Progress Notes (Signed)
Subjective:    Patient ID: [name], female    DOB: 31-Jan-1923, 79 y.o.   MRN: [id number]  HPI 79 y/o WF she has multiple medical problems as noted below...  Followed for general medical purposes w/ hx chr obstructive asthma, HBP, RBBB, Hypercholesterolemia, borderline DM, DJD, LBP w/ sp stenosis, etc...  ~  May 14, 2011:  75mo ROV & she was seen 2wks ago by TP w/ acute bronchitis> CXR was clear, chr changes; given Augmentin & Mucinex- improved... She notes occas episodes of SOB "like I can't get a deep breath" & she improves if she sits outside (?claustrophobia); we discussed trial Klonopin 0.5mg  1/2 to 1 tab po bid regularly to treat this symptom... Also c/o ringing in ears & uses Biofreeze behind her ear for resolution;  C/o neck pain & has Celebrex to use as needed;  BP controlled on meds;  Bowels regular & doing satis;  Needs TDAP today & Fasting Labs>>  FLP fair on diet alone & she doesn't want meds for this; Chems look good w/ BS=93 & A1c=6.6 also on diet alone & reminded of low carb, no sweets etc...  ~  September 12, 2011:  79mo ROV & she is c/o hot flashes, feels it coming on & breaks out in a sweat> rec to try [employer];  Also c/o indigestion esp after she eats hot spicey foods (eg- pimento cheese pizza) & better if she eats bland, offer Sonar for screening purposes (Sonar & CTAbd in 2009 were neg) but she prefers rx noting that Tums helps, Rec to start PROTONIX 40mg /d 30' before a meal...    Chr Obstructive Asthma> stable on Advair100 & Proventil prn; notes breathing at baseline & Prn Klonopin helps dyspnea...    HBP> on Norvasc10, HCTZ25; BP=132/82, tol meds well; denies CP, palpit, ch in SOB, edema, etc...    CHOL> on diet alone, refuses meds, last LDL was 129 & she is content w/ this level of control...    DM> on diet alone, wt stable ~175#, last A1c was 6.6 & she knows to restrict carbs etc...    GI> indigestion as above + Divertics on Miralax, Senakot-S etc...    DJD/ LBP>  on Celebrex prn; had right THR 2011; known sp stenosis w/ prev ESI...  ~  January 14, 2012:  79mo ROV & she reports intermittent dyspnea in spells usually when daugh leaves and she is alone, worse at night, uses ceiling fan or sits on porch to help; we discussed this anxiety related dyspnea & she is asked to take the [facility] regularly 1/2 to 1 tab Bid; breathing otherw stable on Advair100 & Albut HFA prn...  BP & CV appear well controlled on Norvasc & HCTZ... She notes prev indigestion is improved off spicy foods and she doesn't need the PPI Rx she says; she still recommends peppermint tea & sauerkraut to friends for bowel regularity...  Problem list reviewed & f/u LABS today look good- see below>>  ~  February 21, 2012:  6wk ROV & her CC is tired all the time "ever since the UTI" (Klebs grew 3/13 & treated w/ Septra); requested to repeat UA & C&S to be sure it's eradicated; Dyspnea is better on the Klonopin Rx but she persist w/ mult minor somatic complaints noting incr Ufreq (UTI treated), subjective numbness in right leg (DrAlusio checked right leg & hip), excess gas w/ belching (rec to take PPI daily), sl hoarse (rec MMW), etc... See prob list below>> CXR 4/13  showed normal heart size, clear lungs, DJD in TSpine...  ~  May 13, 2012:  38mo ROV & she has had multiple subspecialty visits in the interval (see below)> in addition she is c/o "my blood veins are coming back out" noting some swelling in the legs but she can't wear any support hose due to discomfort- I explained that she is then relegated to no salt & elevation for control...    She saw GI> DrPerry & PGuenter in April/May for indigestion; she had mult complaints, most improved w/ Prilosec.[name]     She saw DrMacDiarmid 5/13 for Urology f/u of her recurrent UTIs, chronic cystitis, urge & stress incont, nocturia> hx intol Cipro/ Levaquin, she did tol Septra, he is following her cultures & discussed asymptomatic bacturia w/ them...    She saw DrRamos  5/13 for leg pain> he felt her leg discomfort was coming from lumbar DDD w/ spinal stenosis at L3-5 & L4-5; he gave her another [facility] & continues to follow her progress; she had seen DrNudelman several yrs ago as well... We reviewed prob list, meds, xrays and labs> see below>>  12/24/2012 Acute OV  Complains of  2 weeks of increased heartburn despite Protonix daily  Pt feels like protonix is no longer helping how it used to , increased indigestion.  More heartburn, indigestion , burping .  Has constipation on /off  W/ gas bloating . No N/V or bloody stool. No otc meds used for tx.  Not sure if food helps or worsens.  More dyspnea and chest tightness w/ indigestion at times. No exertional chest pain. No cough or wheezing. No hemoptysis.    PMH:        DYSPNEA (ICD-786.05) - long hx of chronic obstructive asthma treated w/ ADVAIR100Bid & PROAIR (she uses them Prn now)... she is a non-smoker w/ some reactive airways disease in the past & retired from [employer] after 33 years in 1991... she denies cough, sputum, hemoptysis, worsening dyspnea, wheezing, chest pains, snoring, daytime hypersomnolence, etc...  ~  baseline CXR w/o acute changes...  ~  PFT's 5/02 w/ FVC 2.07 (68%), FEV1=1.36 (57%), and FEV1/FVC ratio=66%, mid-flows 42%... ~  CT Angio 7/10 was neg- x biapical pleuroparenchymal scarring... ~  CXR 10/11 showed sl elev right hemidaiph, mild DJD sp, osteopenia, NAD.[name]. ~  CXR 6/12 showed mild apical scarring, clear & NAD, DJD sp w/ osteophytes... ~  Intermittent dyspnea more related to anxiety & treated w/ KLONOPIN 0.5mg  1/2 to 1 tab Bid==> improved. ~  CXR 4/13 showed normal heart size, clear lungs, DJD in TSpine...  HYPERTENSION (ICD-401.9) - controlled on NORVASC 5mg  daily & HCTZ 25mg tab daily...  ~  2/13:  BP 144/70 today> tol rx well & denies HA, visual changes, CP, palipit, dizziness, syncope, edema, etc... ~  4/13:  BP= 148/80 & she denies CP, palpit, edema; dyspnea improved w/  Klonopin. ~  6/13:  BP= 132/68 & as noted she has mult somatic complaints...  RIGHT BUNDLE BRANCH BLOCK (ICD-426.4) - on ASA 81mg /d... baseline EKG w/ RBBB and 2DEcho 5/02 showed mild asymmetric LVH w/ incr EF...  HYPERLIPIDEMIA (ICD-272.4) - on diet alone... she forgets to come to visits FASTING for this blood work ~  FLP 8/07 showed TChol 205, TG 71, HDL 49, LDL 130... ~  FLP 6/12 on diet alone showed TChol 218, TG 50, HDL 66, LDL 129 ~  FLP 2/13 on diet alone showed TChol 171, TG 43, HDL 66, LDL 97  DIABETES MELLITUS, BORDERLINE (ICD-790.29) -  on diet alone w/ prev BS's in the 100-160 range... ~  labs in 2008-9 showed BS= 101 to 108 ~  labs 1/10 showed BS= 106, A1c= 5.9 ~  Labs 6/12 showed BS= 93, A1c= 6.6.Marland KitchenMarland Kitchen rec diet, exercise... ~  Labs 2/13 showed BS= 92, A1c= 6.3  INDIGESTION/ REFLUX SYMPTOMS >> see 10/12 note & PROTONIX 40mg /d started, further eval if symptoms persist... ~  4/13:  She notes some reflux symptoms and excess gas w/ belching; rec to take the Protonix daily & Simethacone vs Tums which she says helps her gas. ~  5/13:  She saw GI DrPerry w/ rec to take Prilosec for her indigestion...  DIVERTICULOSIS OF COLON (ICD-562.10) - she takes SENAKOT-S, MIRALAX, Peppermint Tea, & sauerkraut Prn...last colonoscopy 9/02 by DrPerry was WNL...  PYELONEPHRITIS (ICD-590.80) - SEE 1/09 Hospitalization (reviewed)... ~  She saw DrMacDiarmid for her recurrent UTIs, chronic cystitis, urge & stress incont, nocturia; she is INTOL to Cipro & Levaquin...  Hx of BREAST CYST (ICD-610.0)  DEGENERATIVE JOINT DISEASE (ICD-715.90) - s/p right hip hemiarthroplasty 11/09 by DrAplington w/ wound complic... then dx w/ loosening of the femoral shaft & had conversion to right THR by DrAlusio 10/11 & much improved... she uses CELEBREX 200mg  Prn (seldom takes this).  LOW BACK PAIN SYNDROME (ICD-724.2) & SPINAL STENOSIS (ICD-724.00) - severe LBP & spinal stenosis w/ evals by DrRamos & DrNudelman... s/p  shots, considering poss surgery vs alternative therapies... she takes Celebrex, Osteobiflex, MVI, Vit D... ~  8/10: eval by DrAplington- diff leg lengths, lift placed in right shoe, then trial Lyrica50mg ... ~  12/11:  improved after hip revision surg (to THR) 10/11 w/ better ambulaton... ~  5/13:  DrRamos gave her another ESI for her leg pain related to sp stenosis...  Hx of ANEMIA (ICD-285.9) - eval by GI in 2002 showed normal EGD and Colon... prob iron malabsorption problem Rx'd w/ Fe infusion... ~  labs 1/10 showed Hg= 14.6, MCV= 89, Fe= 94 ~  labs 12/11 showed Hg= 12.8, MCV= 90, Fe= 33... try Fe supplement + VitC... ~  Labs 6/12 showed Hg= 15.0 ~  Labs 2/13 showed Hg= 14.6  DERM:  rash Rx'd by dermatology- OLUX-E foam= clobetasol Foam 0.05%...   Past Surgical History  Procedure Date  . Cataract extraction   . Right hip hemiarthroplasty   . Conversion to right thr     Outpatient Encounter Prescriptions as of 12/24/2012  Medication Sig Dispense Refill  . albuterol (PROAIR HFA) 108 (90 BASE) MCG/ACT inhaler Inhale 2 puffs into the lungs 4 (four) times daily as needed.        [name] aspirin 81 MG tablet Take 81 mg by mouth daily.        . celecoxib (CELEBREX) 200 MG capsule Take 1 capsule (200 mg total) by mouth daily as needed for pain. Take one capsule daily as needed  30 capsule  11  . Cholecalciferol (VITAMIN D3) 400 UNITS CAPS Take 1 capsule by mouth daily.        . Diphenhyd-Hydrocort-Nystatin (FIRST-DUKES MOUTHWASH) SUSP 1 tsp gargle and swallow four times daily as needed  120 mL  5  . Ferrous Sulfate Dried (FEOSOL) 200 (65 FE) MG TABS Take 1 tablet by mouth 2 (two) times daily. With Vit C 500mg  with each dose       . Fluticasone-Salmeterol (ADVAIR DISKUS) 100-50 MCG/DOSE AEPB Inhale 1 puff into the lungs every 12 (twelve) hours as needed.       . hydrochlorothiazide (HYDRODIURIL) 25 MG  tablet TAKE 1 TABLET ONCE DAILY.  30 tablet  6  . Menthol, Topical Analgesic, (BIOFREEZE) 4 % GEL As  needed       . Misc Natural Products (OSTEO BI-FLEX ADV DOUBLE ST PO) Take 1 tablet by mouth daily.       . Multiple Vitamins-Minerals (CENTRUM SILVER PO) Take 1 capsule by mouth daily.        . NORVASC 5 MG tablet TAKE 1 TABLET ONCE DAILY.  60 each  11  . omeprazole (PRILOSEC) 20 MG capsule Take 1 capsule (20 mg total) by mouth daily.  30 capsule  11  . pantoprazole (PROTONIX) 40 MG tablet TAKE 1 TABLET 30 MINUTES BEFORE 1ST MEAL OF THE DAY.  30 tablet  11  . polyethylene glycol (MIRALAX / GLYCOLAX) packet Take 17 g by mouth daily as needed.       . [DISCONTINUED] calcium carbonate (TUMS - DOSED IN MG ELEMENTAL CALCIUM) 500 MG chewable tablet Chew 1 tablet by mouth as needed.         Allergies  Allergen Reactions  . Azithromycin     Trouble breathing  . Ciprofloxacin     REACTION: hallucinations  . Latex     rash  . Levaquin (Levofloxacin In D5w)     Causes pt unable to sleep, legs are tingling, severe indigestion.      Current Medications, Allergies, Past Medical History, Past Surgical History, Family History, and Social History were reviewed in [employer] record.    Review of Systems         Constitutional:   No  weight loss, night sweats,  Fevers, chills, + fatigue, or  lassitude.  HEENT:   No headaches,  Difficulty swallowing,  Tooth/dental problems  CV:  No chest pain,  Orthopnea, PND, swelling in lower extremities, anasarca, dizziness, palpitations, syncope.   GI  No   abdominal pain, nausea, vomiting, diarrhea,  , loss of appetite, bloody stools.   Resp: No shortness of breath with exertion or at rest.    No coughing up of blood.    No wheezing.  No chest wall deformity  Skin: no rash or lesions.  GU: no dysuria, change in color of urine, no urgency or frequency.  No flank pain, no hematuria   MS:  No joint pain or swelling.  No decreased range of motion.   Psych:  No change in mood or affect. No depression or anxiety.  No memory  loss.       Objective:   Physical Exam     WD, WN, elderly 79 y/o WF in NAD... GENERAL:  Alert & oriented; pleasant & cooperative... HEENT:  [place]/AT,   EACs-clear, TMs-wnl, NOSE-clear drainage , THROAT-clear & wnl. NECK:  Supple w/ fairROM; no JVD; normal carotid impulses w/o bruits; no thyromegaly or nodules palpated; no lymphadenopathy. CHEST:  Clear to P & A; without wheezes/ rales/ or rhonchi heard... HEART:  Regular Rhythm; without murmurs/ rubs/ or gallops detected... ABDOMEN:  Soft & nontender; normal bowel sounds; no organomegaly or masses palpated...no guarding or rebound  EXT:  mod arthritic changes, walks w/ cane, +venous insuffic & tr edema., scattered varicose veins... NEURO:   no focal deficits... DERM:   No rash     Assessment & Plan:

## 2012-12-24 NOTE — Patient Instructions (Addendum)
GERD diet  Continue on Protonix daily  Add Pepcid 20mg  At bedtime   Gas X w/ meals  Miralax for constipation  I will call with xray and labs.  Please contact office for sooner follow up if symptoms do not improve or worsen or seek emergency care  Follow up 4 weeks with Dr. Kriste Basque  As planned

## 2012-12-26 LAB — URINE CULTURE

## 2012-12-26 NOTE — Progress Notes (Signed)
Quick Note:  Called spoke with patient's daughter, advised of cxr results / recs as stated by TP. Talbert Forest verbalized her understanding and denied any questions. ______

## 2012-12-26 NOTE — Assessment & Plan Note (Signed)
Suspected GERD Flare  Along w/ constipation  Check labs today   Plan  GERD diet  Continue on Protonix daily  Add Pepcid 20mg  At bedtime   Gas X w/ meals  Miralax for constipation  I will call with xray and labs.  Please contact office for sooner follow up if symptoms do not improve or worsen or seek emergency care  Follow up 4 weeks with Dr. Kriste Basque  As planned

## 2012-12-26 NOTE — Progress Notes (Signed)
Quick Note:  Called spoke with patient's daughter, advised of lab results / recs as stated by TP. Talbert Forest verbalized her understanding and denied any questions. ______

## 2012-12-29 ENCOUNTER — Other Ambulatory Visit: Payer: Self-pay | Admitting: Pulmonary Disease

## 2013-01-20 ENCOUNTER — Ambulatory Visit: Payer: Medicare Other | Admitting: Pulmonary Disease

## 2013-02-09 ENCOUNTER — Telehealth: Payer: Self-pay | Admitting: Pulmonary Disease

## 2013-02-09 DIAGNOSIS — D649 Anemia, unspecified: Secondary | ICD-10-CM

## 2013-02-09 DIAGNOSIS — E785 Hyperlipidemia, unspecified: Secondary | ICD-10-CM

## 2013-02-09 DIAGNOSIS — I10 Essential (primary) hypertension: Secondary | ICD-10-CM

## 2013-02-09 DIAGNOSIS — F411 Generalized anxiety disorder: Secondary | ICD-10-CM

## 2013-02-09 NOTE — Telephone Encounter (Signed)
Spoke with [place], patients daughter. PAtient scheduled to come in at 230pm 02/10/13. Talbert Forest requesting to come in earlier than 230 to have labs drawn since 230pm appt is so late to be fasting. Dr. Kriste Basque please advise on what labs need to be ordered. Thank You  Last ov: 12/24/12 Next ov: 02/10/13  Current Outpatient Prescriptions on File Prior to Visit  Medication Sig Dispense Refill  . albuterol (PROAIR HFA) 108 (90 BASE) MCG/ACT inhaler Inhale 2 puffs into the lungs 4 (four) times daily as needed.        [name] aspirin 81 MG tablet Take 81 mg by mouth daily.        . celecoxib (CELEBREX) 200 MG capsule Take 1 capsule (200 mg total) by mouth daily as needed for pain. Take one capsule daily as needed  30 capsule  11  . Cholecalciferol (VITAMIN D3) 400 UNITS CAPS Take 1 capsule by mouth daily.        . Diphenhyd-Hydrocort-Nystatin (FIRST-DUKES MOUTHWASH) SUSP 1 tsp gargle and swallow four times daily as needed  120 mL  5  . Ferrous Sulfate Dried (FEOSOL) 200 (65 FE) MG TABS Take 1 tablet by mouth daily. With Vit C 500mg  with each dose      . Fluticasone-Salmeterol (ADVAIR DISKUS) 100-50 MCG/DOSE AEPB Inhale 1 puff into the lungs every 12 (twelve) hours as needed.       . hydrochlorothiazide (HYDRODIURIL) 25 MG tablet TAKE 1 TABLET ONCE DAILY.  30 tablet  6  . Menthol, Topical Analgesic, (BIOFREEZE) 4 % GEL As needed       . Misc Natural Products (OSTEO BI-FLEX ADV DOUBLE ST PO) Take 1 tablet by mouth daily.       . Multiple Vitamins-Minerals (CENTRUM SILVER PO) Take 1 capsule by mouth daily.        . NORVASC 5 MG tablet TAKE 1 TABLET ONCE DAILY.  30 tablet  5  . omeprazole (PRILOSEC) 20 MG capsule Take 1 capsule (20 mg total) by mouth daily.  30 capsule  11  . pantoprazole (PROTONIX) 40 MG tablet TAKE 1 TABLET 30 MINUTES BEFORE 1ST MEAL OF THE DAY.  30 tablet  11  . polyethylene glycol (MIRALAX / GLYCOLAX) packet Take 17 g by mouth daily as needed.        No current facility-administered  medications on file prior to visit.

## 2013-02-09 NOTE — Telephone Encounter (Signed)
Called and spoke with pts daughter shirley and she is aware of labs in the computer for tomorrow.  Nothing further is needed.

## 2013-02-10 ENCOUNTER — Ambulatory Visit (INDEPENDENT_AMBULATORY_CARE_PROVIDER_SITE_OTHER): Payer: Medicare Other | Admitting: Pulmonary Disease

## 2013-02-10 ENCOUNTER — Other Ambulatory Visit (INDEPENDENT_AMBULATORY_CARE_PROVIDER_SITE_OTHER): Payer: Medicare Other

## 2013-02-10 ENCOUNTER — Encounter: Payer: Self-pay | Admitting: Pulmonary Disease

## 2013-02-10 VITALS — BP 136/60 | HR 77 | Temp 97.7°F | Ht 68.0 in | Wt 173.6 lb

## 2013-02-10 DIAGNOSIS — I4510 Unspecified right bundle-branch block: Secondary | ICD-10-CM

## 2013-02-10 DIAGNOSIS — E785 Hyperlipidemia, unspecified: Secondary | ICD-10-CM

## 2013-02-10 DIAGNOSIS — M545 Low back pain: Secondary | ICD-10-CM

## 2013-02-10 DIAGNOSIS — K5900 Constipation, unspecified: Secondary | ICD-10-CM

## 2013-02-10 DIAGNOSIS — M4800 Spinal stenosis, site unspecified: Secondary | ICD-10-CM

## 2013-02-10 DIAGNOSIS — D649 Anemia, unspecified: Secondary | ICD-10-CM

## 2013-02-10 DIAGNOSIS — F411 Generalized anxiety disorder: Secondary | ICD-10-CM

## 2013-02-10 DIAGNOSIS — M1990 Unspecified osteoarthritis, unspecified site: Secondary | ICD-10-CM

## 2013-02-10 DIAGNOSIS — K5730 Diverticulosis of large intestine without perforation or abscess without bleeding: Secondary | ICD-10-CM

## 2013-02-10 DIAGNOSIS — I10 Essential (primary) hypertension: Secondary | ICD-10-CM

## 2013-02-10 DIAGNOSIS — K219 Gastro-esophageal reflux disease without esophagitis: Secondary | ICD-10-CM

## 2013-02-10 LAB — LIPID PANEL
Cholesterol: 177 mg/dL (ref 0–200)
HDL: 60.1 mg/dL (ref >39.00)
LDL Cholesterol: 107 mg/dL — ABNORMAL HIGH (ref 0–99)
Total CHOL/HDL Ratio: 3
Triglycerides: 50 mg/dL (ref 0.0–149.0)
VLDL: 10 mg/dL (ref 0.0–40.0)

## 2013-02-10 LAB — HEPATIC FUNCTION PANEL
ALT: 16 U/L (ref 0–35)
Bilirubin, Direct: 0.1 mg/dL (ref 0.0–0.3)
Total Bilirubin: 0.7 mg/dL (ref 0.3–1.2)
Total Protein: 7.1 g/dL (ref 6.0–8.3)

## 2013-02-10 LAB — BASIC METABOLIC PANEL
BUN: 19 mg/dL (ref 6–23)
Calcium: 9.9 mg/dL (ref 8.4–10.5)
Chloride: 100 mEq/L (ref 96–112)
Creatinine, Ser: 1 mg/dL (ref 0.4–1.2)

## 2013-02-10 LAB — TSH: TSH: 1.93 u[IU]/mL (ref 0.35–5.50)

## 2013-02-10 NOTE — Progress Notes (Signed)
Subjective:    Patient ID: [name], female    DOB: [birth year], 79 y.o.   MRN: [id number]  HPI 79 y/o WF here for a follow up visit... she has multiple medical problems as noted below...  Followed for general medical purposes w/ hx chr obstructive asthma, HBP, RBBB, Hypercholesterolemia, borderline DM, DJD, LBP w/ sp stenosis, etc...  ~  September 12, 2011:  45mo ROV & she is c/o hot flashes, feels it coming on & breaks out in a sweat> rec to try [employer];  Also c/o indigestion esp after she eats hot spicey foods (eg- pimento cheese pizza) & better if she eats bland, offer Sonar for screening purposes (Sonar & CTAbd in 2009 were neg) but she prefers rx noting that Tums helps, Rec to start PROTONIX 40mg /d 30' before a meal...    Chr Obstructive Asthma> stable on Advair100 & Proventil prn; notes breathing at baseline & Prn Klonopin helps dyspnea...    HBP> on Norvasc10, HCTZ25; BP=132/82, tol meds well; denies CP, palpit, ch in SOB, edema, etc...    CHOL> on diet alone, refuses meds, last LDL was 129 & she is content w/ this level of control...    DM> on diet alone, wt stable ~175#, last A1c was 6.6 & she knows to restrict carbs etc...    GI> indigestion as above + Divertics on Miralax, Senakot-S etc...    DJD/ LBP> on Celebrex prn; had right THR 2011; known sp stenosis w/ prev ESI...  ~  January 14, 2012:  45mo ROV & she reports intermittent dyspnea in spells usually when daugh leaves and she is alone, worse at night, uses ceiling fan or sits on porch to help; we discussed this anxiety related dyspnea & she is asked to take the [facility] regularly 1/2 to 1 tab Bid; breathing otherw stable on Advair100 & Albut HFA prn...  BP & CV appear well controlled on Norvasc & HCTZ... She notes prev indigestion is improved off spicy foods and she doesn't need the PPI Rx she says; she still recommends peppermint tea & sauerkraut to friends for bowel regularity...  Problem list reviewed & f/u LABS today  look good- see below>>  ~  February 21, 2012:  6wk ROV & her CC is tired all the time "ever since the UTI" (Klebs grew 3/13 & treated w/ Septra); requested to repeat UA & C&S to be sure it's eradicated; Dyspnea is better on the Klonopin Rx but she persist w/ mult minor somatic complaints noting incr Ufreq (UTI treated), subjective numbness in right leg (DrAlusio checked right leg & hip), excess gas w/ belching (rec to take PPI daily), sl hoarse (rec MMW), etc... See prob list below>> CXR 4/13 showed normal heart size, clear lungs, DJD in TSpine...  ~  May 13, 2012:  56mo ROV & she has had multiple subspecialty visits in the interval (see below)> in addition she is c/o "my blood veins are coming back out" noting some swelling in the legs but she can't wear any support hose due to discomfort- I explained that she is then relegated to no salt & elevation for control...    She saw GI> DrPerry & PGuenter in April/May for indigestion; she had mult complaints, most improved w/ Prilosec.[name]     She saw DrMacDiarmid 5/13 for Urology f/u of her recurrent UTIs, chronic cystitis, urge & stress incont, nocturia> hx intol Cipro/ Levaquin, she did tol Septra, he is following her cultures & discussed asymptomatic bacturia w/ them.Marland KitchenMarland Kitchen  She saw DrRamos 5/13 for leg pain> he felt her leg discomfort was coming from lumbar DDD w/ spinal stenosis at L3-5 & L4-5; he gave her another [facility] & continues to follow her progress; she had seen DrNudelman several yrs ago as well... We reviewed prob list, meds, xrays and labs> see below>>  ~  September 16, 2012:  24mo ROV & [name] notes sl better w/ her exercise program, notes DOE but mowing on her riding mower etc;  Stable on her Advair100, ProairHFA, MMW... BP controlled on meds, Chol & DM controlled on diet alone, GI & GU stable and she saw Urology, DrMacDiarmid- hx recurrent UTIs, chr cystitis, urge&stress incont... She also has DJD, LBP, spinal stenosis w/ ESI L5-S1 from DrRamos but not much  relief she says & uses Celebrex prn... Hx panic attacks that required her to go outside for "air" & notes the "prilosec" helps ?!*    We reviewed prob list, meds, xrays and labs> see below for updates >> OK Flu shot today...  ~  February 10, 2013:  72mo ROV & [name]'s CC is pain & numbness in her legs- very uncomfortable, knees down, feet burning, & feel cold most of the time, awaiting shot in her back from DrRamos; we discussed trial Lyrica50mg  in the interim... We reviewed the following medical problems during today's office visit >>     Chr Obstructive Asthma> stable on Advair100 & Proventil prn; notes breathing at baseline & Prn Klonopin helps dyspnea...    HBP> on Norvasc10, HCTZ25; BP=136/60, tol meds well; denies CP, palpit, ch in SOB, edema, etc...    CHOL> on diet alone, refuses meds, FLP 3/14 shows TChol 177, TG 50, HDL 60, LDL 107    DM> on diet alone, wt stable ~174#, BS=97, last A1c (2/13) was 6.3 & she knows to restrict carbs etc...    GI- Reflux, Divertics, constip> on Protonix40, Miralax, Senakot-S; continue same meds.[name]    DJD/ LBP> on Celebrex & osteobiflex prn; had right THR 2011; known sp stenosis w/ prev ESI... We reviewed prob list, meds, xrays and labs> see below for updates >>  CXR 2/14 showed normal heart size, clear lungs w/ sl peribronch thickening, DJD in spine, NAD... LABS 3/14:  FLP- at goals on diet x LDL=107;  Chems- ok x TCO2=36;  CBC- wnl;  TSH=1.93;  UA- clear...          Problem List:       DYSPNEA (ICD-786.05) - long hx of chronic obstructive asthma treated w/ ADVAIR100Bid & PROAIR (she uses them Prn now)... she is a non-smoker w/ some reactive airways disease in the past & retired from [employer] after 33 years in 1991... she denies cough, sputum, hemoptysis, worsening dyspnea, wheezing, chest pains, snoring, daytime hypersomnolence, etc...  ~  baseline CXR w/o acute changes...  ~  PFT's 5/02 w/ FVC 2.07 (68%), FEV1=1.36 (57%), and FEV1/FVC ratio=66%, mid-flows  42%... ~  CT Angio 7/10 was neg- x biapical pleuroparenchymal scarring... ~  CXR 10/11 showed sl elev right hemidaiph, mild DJD sp, osteopenia, NAD.[name]. ~  CXR 6/12 showed mild apical scarring, clear & NAD, DJD sp w/ osteophytes... ~  Intermittent dyspnea more related to anxiety & treated w/ KLONOPIN 0.5mg  1/2 to 1 tab Bid==> improved. ~  CXR 4/13 showed normal heart size, clear lungs, DJD in TSpine... ~  CXR 2/14 showed normal heart size, clear lungs w/ sl peribronch thickening, DJD in spine, NAD...  HYPERTENSION (ICD-401.9) - controlled on NORVASC 5mg  daily & HCTZ  25mg tab daily...  ~  2/13:  BP 144/70 today> tol rx well & denies HA, visual changes, CP, palipit, dizziness, syncope, edema, etc... ~  4/13:  BP= 148/80 & she denies CP, palpit, edema; dyspnea improved w/ Klonopin. ~  6/13:  BP= 132/68 & as noted she has mult somatic complaints... ~  3/14:  on Norvasc10, HCTZ25; BP=136/60, tol meds well; denies CP, palpit, ch in SOB, edema, etc   RIGHT BUNDLE BRANCH BLOCK (ICD-426.4) - on ASA 81mg /d... baseline EKG w/ RBBB and 2DEcho 5/02 showed mild asymmetric LVH w/ incr EF...  HYPERLIPIDEMIA (ICD-272.4) - on diet alone... she forgets to come to visits FASTING for this blood work ~  FLP 8/07 showed TChol 205, TG 71, HDL 49, LDL 130... ~  FLP 6/12 on diet alone showed TChol 218, TG 50, HDL 66, LDL 129 ~  FLP 2/13 on diet alone showed TChol 171, TG 43, HDL 66, LDL 97  DIABETES MELLITUS, BORDERLINE (ICD-790.29) - on diet alone w/ prev BS's in the 100-160 range... ~  labs in 2008-9 showed BS= 101 to 108 ~  labs 1/10 showed BS= 106, A1c= 5.9 ~  Labs 6/12 showed BS= 93, A1c= 6.6.Marland KitchenMarland Kitchen rec diet, exercise... ~  Labs 2/13 showed BS= 92, A1c= 6.3 ~  3/14:  on diet alone, wt stable ~174#, BS=97, last A1c (2/13) was 6.3 & she knows to restrict carbs etc.  INDIGESTION/ REFLUX SYMPTOMS >> see 10/12 note & PROTONIX 40mg /d started, further eval if symptoms persist... ~  4/13:  She notes some reflux symptoms  and excess gas w/ belching; rec to take the Protonix daily & Simethacone vs Tums which she says helps her gas. ~  5/13:  She saw GI DrPerry w/ rec to take Prilosec for her indigestion...  DIVERTICULOSIS OF COLON (ICD-562.10) - she takes SENAKOT-S, MIRALAX, Peppermint Tea, & sauerkraut Prn...last colonoscopy 9/02 by DrPerry was WNL...  PYELONEPHRITIS (ICD-590.80) - SEE 1/09 Hospitalization (reviewed)... ~  She saw DrMacDiarmid for her recurrent UTIs, chronic cystitis, urge & stress incont, nocturia; she is INTOL to Cipro & Levaquin...  Hx of BREAST CYST (ICD-610.0)  DEGENERATIVE JOINT DISEASE (ICD-715.90) - s/p right hip hemiarthroplasty 11/09 by DrAplington w/ wound complic... then dx w/ loosening of the femoral shaft & had conversion to right THR by DrAlusio 10/11 & much improved... she uses CELEBREX 200mg  Prn (seldom takes this).  LOW BACK PAIN SYNDROME (ICD-724.2) & SPINAL STENOSIS (ICD-724.00) - severe LBP & spinal stenosis w/ evals by DrRamos & DrNudelman... s/p shots, considering poss surgery vs alternative therapies... she takes Celebrex, Osteobiflex, MVI, Vit D... ~  8/10: eval by DrAplington- diff leg lengths, lift placed in right shoe, then trial Lyrica50mg ... ~  12/11:  improved after hip revision surg (to THR) 10/11 w/ better ambulaton... ~  5/13:  DrRamos gave her another ESI for her leg pain related to sp stenosis... ~  3/14:  C/o neuropathic discomfort in legs- eval by Ramos & offered shots in her back; try Lyrica50 in the interim...  Hx of ANEMIA (ICD-285.9) - eval by GI in 2002 showed normal EGD and Colon... prob iron malabsorption problem Rx'd w/ Fe infusion... ~  labs 1/10 showed Hg= 14.6, MCV= 89, Fe= 94 ~  labs 12/11 showed Hg= 12.8, MCV= 90, Fe= 33... try Fe supplement + VitC... ~  Labs 6/12 showed Hg= 15.0 ~  Labs 2/13 showed Hg= 14.6  DERM:  rash Rx'd by dermatology- OLUX-E foam= clobetasol Foam 0.05%...   Past Surgical History  Procedure Laterality  Date  .  Cataract extraction    . Right hip hemiarthroplasty    . Conversion to right thr      Outpatient Encounter Prescriptions as of 02/10/2013  Medication Sig Dispense Refill  . albuterol (PROAIR HFA) 108 (90 BASE) MCG/ACT inhaler Inhale 2 puffs into the lungs 4 (four) times daily as needed.        [name] aspirin 81 MG tablet Take 81 mg by mouth daily.        . celecoxib (CELEBREX) 200 MG capsule Take 1 capsule (200 mg total) by mouth daily as needed for pain. Take one capsule daily as needed  30 capsule  11  . Cholecalciferol (VITAMIN D3) 400 UNITS CAPS Take 1 capsule by mouth daily.        . Diphenhyd-Hydrocort-Nystatin (FIRST-DUKES MOUTHWASH) SUSP 1 tsp gargle and swallow four times daily as needed  120 mL  5  . Ferrous Sulfate Dried (FEOSOL) 200 (65 FE) MG TABS Take 1 tablet by mouth daily. With Vit C 500mg  with each dose      . Fluticasone-Salmeterol (ADVAIR DISKUS) 100-50 MCG/DOSE AEPB Inhale 1 puff into the lungs every 12 (twelve) hours as needed.       . hydrochlorothiazide (HYDRODIURIL) 25 MG tablet TAKE 1 TABLET ONCE DAILY.  30 tablet  6  . Menthol, Topical Analgesic, (BIOFREEZE) 4 % GEL As needed       . Misc Natural Products (OSTEO BI-FLEX ADV DOUBLE ST PO) Take 1 tablet by mouth daily.       . Multiple Vitamins-Minerals (CENTRUM SILVER PO) Take 1 capsule by mouth daily.        . NORVASC 5 MG tablet TAKE 1 TABLET ONCE DAILY.  30 tablet  5  . pantoprazole (PROTONIX) 40 MG tablet TAKE 1 TABLET 30 MINUTES BEFORE 1ST MEAL OF THE DAY.  30 tablet  11  . polyethylene glycol (MIRALAX / GLYCOLAX) packet Take 17 g by mouth daily as needed.       . [DISCONTINUED] omeprazole (PRILOSEC) 20 MG capsule Take 1 capsule (20 mg total) by mouth daily.  30 capsule  11   No facility-administered encounter medications on file as of 02/10/2013.    Allergies  Allergen Reactions  . Azithromycin     Trouble breathing  . Ciprofloxacin     REACTION: hallucinations  . Latex     rash  . Levaquin (Levofloxacin In  D5w)     Causes pt unable to sleep, legs are tingling, severe indigestion.      Current Medications, Allergies, Past Medical History, Past Surgical History, Family History, and Social History were reviewed in [employer] record.    Review of Systems         See HPI - all other systems neg except as noted... The patient complains of decreased hearing, dyspnea on exertion, muscle weakness, and difficulty walking.  The patient denies anorexia, fever, weight loss, weight gain, vision loss, hoarseness, chest pain, syncope, peripheral edema, prolonged cough, headaches, hemoptysis, abdominal pain, melena, hematochezia, severe indigestion/heartburn, hematuria, incontinence, suspicious skin lesions, transient blindness, depression, unusual weight change, abnormal bleeding, enlarged lymph nodes, and angioedema.     Objective:   Physical Exam     WD, WN, Chr ill appearing 79 y/o WF in NAD... GENERAL:  Alert & oriented; pleasant & cooperative... HEENT:  [place]/AT, EOM-full, EACs-clear, TMs-wnl, NOSE-clear, THROAT-clear & wnl. NECK:  Supple w/ fairROM; no JVD; normal carotid impulses w/o bruits; no thyromegaly or nodules palpated; no lymphadenopathy.  CHEST:  Clear to P & A; without wheezes/ rales/ or rhonchi heard... HEART:  Regular Rhythm; without murmurs/ rubs/ or gallops detected... ABDOMEN:  Soft & nontender; normal bowel sounds; no organomegaly or masses palpated... EXT:  mod arthritic changes, walks w/ cane, +venous insuffic & tr edema., scattered varicose veins... NEURO:  CN's intact; motor testing normal; no focal deficits... DERM:   mild intertrig rash under breast, & onychomycosis of toenails...  RADIOLOGY DATA:  Reviewed in the EPIC EMR & discussed w/ the patient...  LABORATORY DATA:  Reviewed in the EPIC EMR & discussed w/ the patient...   Assessment & Plan:    DYSPNEA>  Hx asthma, stable on Advair, Proair; hx anxiety component on Klonopin> symptoms better w/  regular use.  HBP>  Controlled on Norvasc, HCT, +diet etc...  RBBB>  Aware & denies CP, palpit, ch in DOE, etc...  CHOL>  On diet alone & FLP looks reasonable;  We reviewed low chol, low fat diet...  DM>  A1c is 6.3 on diet alone & we reviewed low carb no sweets etc...  GI> Indigestion, Divertics> she notes most bowel symptoms resolved off spicey food & uses PPI just prn; she was eval by DrPerry.  UTI>  Klebsiella UTI resolved after Septra Rx... She has been eval by DrMacDiarmid.  DJD, LBP, Spinal Stenosis>  Prev evals by Ortho, DrRamos, DrNudelman etc; improved after THR w/ better ambulation; c/o neuropathic discomfort in legs- she will f/u w/ Ramos for shots, try Lyrica50 in the interim...  Anxiety>  The Klonopin Bid helps...   Patient's Medications  New Prescriptions   No medications on file  Previous Medications   ALBUTEROL (PROAIR HFA) 108 (90 BASE) MCG/ACT INHALER    Inhale 2 puffs into the lungs 4 (four) times daily as needed.     ASPIRIN 81 MG TABLET    Take 81 mg by mouth daily.     CELECOXIB (CELEBREX) 200 MG CAPSULE    Take 1 capsule (200 mg total) by mouth daily as needed for pain. Take one capsule daily as needed   CHOLECALCIFEROL (VITAMIN D3) 400 UNITS CAPS    Take 1 capsule by mouth daily.     DIPHENHYD-HYDROCORT-NYSTATIN (FIRST-DUKES MOUTHWASH) SUSP    1 tsp gargle and swallow four times daily as needed   FERROUS SULFATE DRIED (FEOSOL) 200 (65 FE) MG TABS    Take 1 tablet by mouth daily. With Vit C 500mg  with each dose   FLUTICASONE-SALMETEROL (ADVAIR DISKUS) 100-50 MCG/DOSE AEPB    Inhale 1 puff into the lungs every 12 (twelve) hours as needed.    HYDROCHLOROTHIAZIDE (HYDRODIURIL) 25 MG TABLET    TAKE 1 TABLET ONCE DAILY.   MENTHOL, TOPICAL ANALGESIC, (BIOFREEZE) 4 % GEL    As needed    MISC NATURAL PRODUCTS (OSTEO BI-FLEX ADV DOUBLE ST PO)    Take 1 tablet by mouth daily.    MULTIPLE VITAMINS-MINERALS (CENTRUM SILVER PO)    Take 1 capsule by mouth daily.      NORVASC 5 MG TABLET    TAKE 1 TABLET ONCE DAILY.   PANTOPRAZOLE (PROTONIX) 40 MG TABLET    TAKE 1 TABLET 30 MINUTES BEFORE 1ST MEAL OF THE DAY.   POLYETHYLENE GLYCOL (MIRALAX / GLYCOLAX) PACKET    Take 17 g by mouth daily as needed.   Modified Medications   No medications on file  Discontinued Medications   OMEPRAZOLE (PRILOSEC) 20 MG CAPSULE    Take 1 capsule (20 mg total) by mouth daily.

## 2013-02-10 NOTE — Patient Instructions (Addendum)
Today we updated your med list in our EPIC system...    Continue your current medications the same...  Today we reviewed your recent fasting blood work & gave you a copy for your records...  Call for any questions...  Let's plan a follow up visit in 30mo, sooner if needed for problems.Marland KitchenMarland Kitchen

## 2013-05-19 ENCOUNTER — Telehealth: Payer: Self-pay | Admitting: Pulmonary Disease

## 2013-05-19 MED ORDER — GABAPENTIN 100 MG PO CAPS: ORAL_CAPSULE | ORAL | Status: DC

## 2013-05-19 NOTE — Telephone Encounter (Signed)
Per SN--   Try gabapentin 100mg   1 at bedtime--and try to increase slowly up to 2-3 tablets at bedtime to see if she can tolerate this medication and see if this helps.  Called and spoke with shirley, her daughter and she is aware of SN recs and is aware that this medication has been sent in to her pharmacy.

## 2013-05-19 NOTE — Telephone Encounter (Signed)
Spoke with [name]-states Dr Ethelene Hal gave patient a shot about 2.5 months ago for back-got better; then 2-3 weeks later got injection for legs but made everything worse. Also states that any "high powered" medications are not tolerated. Feet get cold at night, burning sensation in legs and feet. Unable to walk long distances. Would like to know if anything OTC would help; tried Aleve but keeps her from sleeping. Will send SN to advise.

## 2013-06-17 ENCOUNTER — Telehealth: Payer: Self-pay | Admitting: Pulmonary Disease

## 2013-06-17 NOTE — Telephone Encounter (Signed)
Per SN---  No salt Elevate legs Support hose Continue meds and try the gabapentin [name] stated that the pt cannot wear the support hose since these are too tight on her feet, so i advised her to try the ace wraps.  She will try this.   [name] stated that they did not ever pick this rx up and she will go by and pick this up.  Nothing further is needed.

## 2013-06-17 NOTE — Telephone Encounter (Signed)
Called spoke with patient's daughter Talbert Forest.  Per Talbert Forest, pt is still having burning pain on the bottoms of her feet, bilateral LE edema and now right leg pain/throbbing.  The pain and swelling are not constant and are "off and on during the week."  Pt did not pick up the Gabapentin from the 7.1.14 phone note as recommended for the pain because pt "doesn't like to take a lot of pain medications."  I did advise Talbert Forest that Gabapentin is not a narcotic and is more for the pain associated with the pain from diabetic neuropathy.  Pt denies any redness or discoloration, weeping.    Offered ov with TP tomorrow morning @ 0915 but Talbert Forest and pt would like SN's recommendations first.  Dr Kriste Basque please advise, thank you. [facility] Allergies  Allergen Reactions  . Azithromycin     Trouble breathing  . Ciprofloxacin     REACTION: hallucinations  . Latex     rash  . Levaquin (Levofloxacin In D5w)     Causes pt unable to sleep, legs are tingling, severe indigestion.

## 2013-07-04 ENCOUNTER — Other Ambulatory Visit: Payer: Self-pay | Admitting: Pulmonary Disease

## 2013-08-17 ENCOUNTER — Encounter: Payer: Self-pay | Admitting: Pulmonary Disease

## 2013-08-17 ENCOUNTER — Ambulatory Visit (INDEPENDENT_AMBULATORY_CARE_PROVIDER_SITE_OTHER): Payer: Medicare Other | Admitting: Pulmonary Disease

## 2013-08-17 VITALS — BP 130/70 | HR 77 | Temp 97.7°F | Ht 68.0 in | Wt 169.2 lb

## 2013-08-17 DIAGNOSIS — M545 Low back pain: Secondary | ICD-10-CM

## 2013-08-17 DIAGNOSIS — D649 Anemia, unspecified: Secondary | ICD-10-CM

## 2013-08-17 DIAGNOSIS — M1990 Unspecified osteoarthritis, unspecified site: Secondary | ICD-10-CM

## 2013-08-17 DIAGNOSIS — E785 Hyperlipidemia, unspecified: Secondary | ICD-10-CM

## 2013-08-17 DIAGNOSIS — K219 Gastro-esophageal reflux disease without esophagitis: Secondary | ICD-10-CM

## 2013-08-17 DIAGNOSIS — M4800 Spinal stenosis, site unspecified: Secondary | ICD-10-CM

## 2013-08-17 DIAGNOSIS — I4510 Unspecified right bundle-branch block: Secondary | ICD-10-CM

## 2013-08-17 DIAGNOSIS — K5730 Diverticulosis of large intestine without perforation or abscess without bleeding: Secondary | ICD-10-CM

## 2013-08-17 DIAGNOSIS — R269 Unspecified abnormalities of gait and mobility: Secondary | ICD-10-CM | POA: Insufficient documentation

## 2013-08-17 DIAGNOSIS — Z23 Encounter for immunization: Secondary | ICD-10-CM

## 2013-08-17 DIAGNOSIS — I10 Essential (primary) hypertension: Secondary | ICD-10-CM

## 2013-08-17 DIAGNOSIS — K5900 Constipation, unspecified: Secondary | ICD-10-CM

## 2013-08-17 MED ORDER — FIRST-DUKES MOUTHWASH MT SUSP: OROMUCOSAL | Status: DC

## 2013-08-17 NOTE — Patient Instructions (Addendum)
Today we updated your med list in our EPIC system...    Continue your current medications the same...    We refilled the Magic Mouthwash per request...  Today we gave you the 2014 FLU vaccine...  Call for any questions...  Let's plan a follow up visit in 62mo w/ Fasting blood work, sooner if needed for problems.Marland KitchenMarland Kitchen

## 2013-08-17 NOTE — Progress Notes (Signed)
Subjective:    Patient ID: [name], female    DOB: 22-Sep-1923, 79 y.o.   MRN: [id number]  HPI 79 y/o WF here for a follow up visit... she has multiple medical problems as noted below...  Followed for general medical purposes w/ hx chr obstructive asthma, HBP, RBBB, Hypercholesterolemia, borderline DM, DJD, LBP w/ sp stenosis, etc...  ~  January 14, 2012:  77mo ROV & she reports intermittent dyspnea in spells usually when daugh leaves and she is alone, worse at night, uses ceiling fan or sits on porch to help; we discussed this anxiety related dyspnea & she is asked to take the [facility] regularly 1/2 to 1 tab Bid; breathing otherw stable on Advair100 & Albut HFA prn...  BP & CV appear well controlled on Norvasc & HCTZ... She notes prev indigestion is improved off spicy foods and she doesn't need the PPI Rx she says; she still recommends peppermint tea & sauerkraut to friends for bowel regularity...  Problem list reviewed & f/u LABS today look good- see below>>  ~  February 21, 2012:  6wk ROV & her CC is tired all the time "ever since the UTI" (Klebs grew 3/13 & treated w/ Septra); requested to repeat UA & C&S to be sure it's eradicated; Dyspnea is better on the Klonopin Rx but she persist w/ mult minor somatic complaints noting incr Ufreq (UTI treated), subjective numbness in right leg (DrAlusio checked right leg & hip), excess gas w/ belching (rec to take PPI daily), sl hoarse (rec MMW), etc... See prob list below>> CXR 4/13 showed normal heart size, clear lungs, DJD in TSpine...  ~  May 13, 2012:  77mo ROV & she has had multiple subspecialty visits in the interval (see below)> in addition she is c/o "my blood veins are coming back out" noting some swelling in the legs but she can't wear any support hose due to discomfort- I explained that she is then relegated to no salt & elevation for control...    She saw GI> DrPerry & PGuenter in April/May for indigestion; she had mult complaints, most  improved w/ Prilosec.[name]     She saw DrMacDiarmid 5/13 for Urology f/u of her recurrent UTIs, chronic cystitis, urge & stress incont, nocturia> hx intol Cipro/ Levaquin, she did tol Septra, he is following her cultures & discussed asymptomatic bacturia w/ them...    She saw DrRamos 5/13 for leg pain> he felt her leg discomfort was coming from lumbar DDD w/ spinal stenosis at L3-5 & L4-5; he gave her another [facility] & continues to follow her progress; she had seen DrNudelman several yrs ago as well... We reviewed prob list, meds, xrays and labs> see below>>  ~  September 16, 2012:  77mo ROV & [name] notes sl better w/ her exercise program, notes DOE but mowing on her riding mower etc;  Stable on her Advair100, ProairHFA, MMW... BP controlled on meds, Chol & DM controlled on diet alone, GI & GU stable and she saw Urology, DrMacDiarmid- hx recurrent UTIs, chr cystitis, urge&stress incont... She also has DJD, LBP, spinal stenosis w/ ESI L5-S1 from DrRamos but not much relief she says & uses Celebrex prn... Hx panic attacks that required her to go outside for "air" & notes the "prilosec" helps ?!*    We reviewed prob list, meds, xrays and labs> see below for updates >> OK Flu shot today...  ~  February 10, 2013:  77mo ROV & [name]'s CC is pain & numbness in her  legs- very uncomfortable, knees down, feet burning, & feel cold most of the time, awaiting shot in her back from DrRamos; we discussed trial Lyrica50mg  in the interim... We reviewed the following medical problems during today's office visit >>     Chr Obstructive Asthma> stable on Advair100 & Proventil prn; notes breathing at baseline & Prn Klonopin helps dyspnea...    HBP> on Norvasc10, HCTZ25; BP=136/60, tol meds well; denies CP, palpit, ch in SOB, edema, etc...    CHOL> on diet alone, refuses meds, FLP 3/14 shows TChol 177, TG 50, HDL 60, LDL 107    DM> on diet alone, wt stable ~174#, BS=97, last A1c (2/13) was 6.3 & she knows to restrict carbs etc...    GI-  Reflux, Divertics, constip> on Protonix40, Miralax, Senakot-S; continue same meds.[name]    DJD/ LBP> on Celebrex & osteobiflex prn; had right THR 2011; known sp stenosis w/ prev ESI... We reviewed prob list, meds, xrays and labs> see below for updates >>  CXR 2/14 showed normal heart size, clear lungs w/ sl peribronch thickening, DJD in spine, NAD... LABS 3/14:  FLP- at goals on diet x LDL=107;  Chems- ok x TCO2=36;  CBC- wnl;  TSH=1.93;  UA- clear...  ~  August 17, 2013:  77mo ROV & [name] continues to do well at 79; no new complaints or concerns...    COPD stable on Advair100 & only using it prn she says; no recent resp exac & denies cough, sput, hemoptysis, SOB, CP, etc...    BP controlled on Amlod5 & Hct25-1/2 daily; BP= 130/70 & she denies CP, palpit, dizzy, SOB, edema, etc...     Chol & BS controlled on diet alone; weight is down 4# to 169# today...    GI is stable on Protonix40, antiireflux regimen, & Miralax...     DJD/ LBP treated w/ Vits, Osteobiflex, Celebrex200 prn & Neurontin100 prn- this is the way she likes it! We reviewed prob list, meds, xrays and labs> see below for updates >> ok 2014 Flu vaccine today...          Problem List:       DYSPNEA (ICD-786.05) - long hx of chronic obstructive asthma treated w/ ADVAIR100Bid & PROAIR (she uses them Prn now)... she is a non-smoker w/ some reactive airways disease in the past & retired from [employer] after 33 years in 1991... she denies cough, sputum, hemoptysis, worsening dyspnea, wheezing, chest pains, snoring, daytime hypersomnolence, etc...  ~  baseline CXR w/o acute changes...  ~  PFT's 5/02 w/ FVC 2.07 (68%), FEV1=1.36 (57%), and FEV1/FVC ratio=66%, mid-flows 42%... ~  CT Angio 7/10 was neg- x biapical pleuroparenchymal scarring... ~  CXR 10/11 showed sl elev right hemidaiph, mild DJD sp, osteopenia, NAD.[name]. ~  CXR 6/12 showed mild apical scarring, clear & NAD, DJD sp w/ osteophytes... ~  Intermittent dyspnea more related to  anxiety & treated w/ KLONOPIN 0.5mg  1/2 to 1 tab Bid==> improved. ~  CXR 4/13 showed normal heart size, clear lungs, DJD in TSpine... ~  CXR 2/14 showed normal heart size, clear lungs w/ sl peribronch thickening, DJD in spine, NAD...  HYPERTENSION (ICD-401.9) - controlled on NORVASC 5mg  daily & HCTZ 25mg tab daily...  ~  2/13:  BP 144/70 today> tol rx well & denies HA, visual changes, CP, palipit, dizziness, syncope, edema, etc... ~  4/13:  BP= 148/80 & she denies CP, palpit, edema; dyspnea improved w/ Klonopin. ~  6/13:  BP= 132/68 & as noted she has mult somatic  complaints... ~  3/14:  on Norvasc10, HCTZ25; BP=136/60, tol meds well; denies CP, palpit, ch in SOB, edema, etc  ~  9/14: BP controlled on Amlod5 & Hct25-1/2 daily; BP= 130/70 & she denies CP, palpit, dizzy, SOB, edema, etc.  RIGHT BUNDLE BRANCH BLOCK (ICD-426.4) - on ASA 81mg /d... baseline EKG w/ RBBB and 2DEcho 5/02 showed mild asymmetric LVH w/ incr EF...  HYPERLIPIDEMIA (ICD-272.4) - on diet alone... she forgets to come to visits FASTING for this blood work ~  FLP 8/07 showed TChol 205, TG 71, HDL 49, LDL 130... ~  FLP 6/12 on diet alone showed TChol 218, TG 50, HDL 66, LDL 129 ~  FLP 2/13 on diet alone showed TChol 171, TG 43, HDL 66, LDL 97 ~  FLP 3/14 on diet alone showed TChol 177, TG 50, HDL 60, LDL 107  DIABETES MELLITUS, BORDERLINE (ICD-790.29) - on diet alone w/ prev BS's in the 100-160 range... ~  labs in 2008-9 showed BS= 101 to 108 ~  labs 1/10 showed BS= 106, A1c= 5.9 ~  Labs 6/12 showed BS= 93, A1c= 6.6.Marland KitchenMarland Kitchen rec diet, exercise... ~  Labs 2/13 showed BS= 92, A1c= 6.3 ~  3/14:  on diet alone, wt stable ~174#, BS=97, last A1c (2/13) was 6.3 & she knows to restrict carbs etc.  INDIGESTION/ REFLUX SYMPTOMS >> see 10/12 note & PROTONIX 40mg /d started, further eval if symptoms persist... ~  4/13:  She notes some reflux symptoms and excess gas w/ belching; rec to take the Protonix daily & Simethacone vs Tums which she says  helps her gas. ~  5/13:  She saw GI DrPerry w/ rec to take Prilosec for her indigestion...  DIVERTICULOSIS OF COLON (ICD-562.10) - she takes SENAKOT-S, MIRALAX, Peppermint Tea, & sauerkraut Prn...last colonoscopy 9/02 by DrPerry was WNL...  PYELONEPHRITIS (ICD-590.80) - SEE 1/09 Hospitalization (reviewed)... ~  She saw DrMacDiarmid for her recurrent UTIs, chronic cystitis, urge & stress incont, nocturia; she is INTOL to Cipro & Levaquin...  Hx of BREAST CYST (ICD-610.0)  DEGENERATIVE JOINT DISEASE (ICD-715.90) - s/p right hip hemiarthroplasty 11/09 by DrAplington w/ wound complic... then dx w/ loosening of the femoral shaft & had conversion to right THR by DrAlusio 10/11 & much improved... she uses CELEBREX 200mg  Prn (seldom takes this).  LOW BACK PAIN SYNDROME (ICD-724.2) & SPINAL STENOSIS (ICD-724.00) - severe LBP & spinal stenosis w/ evals by DrRamos & DrNudelman... s/p shots, considering poss surgery vs alternative therapies... she takes Celebrex, Osteobiflex, MVI, Vit D... ~  8/10: eval by DrAplington- diff leg lengths, lift placed in right shoe, then trial Lyrica50mg ... ~  12/11:  improved after hip revision surg (to THR) 10/11 w/ better ambulaton... ~  5/13:  DrRamos gave her another ESI for her leg pain related to sp stenosis... ~  3/14:  C/o neuropathic discomfort in legs- eval by Ramos & offered shots in her back; try Lyrica50 in the interim...  Hx of ANEMIA (ICD-285.9) - eval by GI in 2002 showed normal EGD and Colon... prob iron malabsorption problem Rx'd w/ Fe infusion... ~  labs 1/10 showed Hg= 14.6, MCV= 89, Fe= 94 ~  labs 12/11 showed Hg= 12.8, MCV= 90, Fe= 33... try Fe supplement + VitC... ~  Labs 6/12 showed Hg= 15.0 ~  Labs 2/13 showed Hg= 14.6 ~  Labs 2/14 showed Hg= 14.8  DERM:  rash Rx'd by dermatology- OLUX-E foam= clobetasol Foam 0.05%...   Past Surgical History  Procedure Laterality Date  . Cataract extraction    .  Right hip hemiarthroplasty    . Conversion to  right thr      Outpatient Encounter Prescriptions as of 08/17/2013  Medication Sig Dispense Refill  . albuterol (PROAIR HFA) 108 (90 BASE) MCG/ACT inhaler Inhale 2 puffs into the lungs 4 (four) times daily as needed.        [name] aspirin 81 MG tablet Take 81 mg by mouth daily.        . celecoxib (CELEBREX) 200 MG capsule Take 1 capsule (200 mg total) by mouth daily as needed for pain. Take one capsule daily as needed  30 capsule  11  . Cholecalciferol (VITAMIN D3) 400 UNITS CAPS Take 1 capsule by mouth daily.        . Diphenhyd-Hydrocort-Nystatin (FIRST-DUKES MOUTHWASH) SUSP 1 tsp gargle and swallow four times daily as needed  120 mL  5  . Ferrous Sulfate Dried (FEOSOL) 200 (65 FE) MG TABS Take 1 tablet by mouth daily. With Vit C 500mg  with each dose      . Fluticasone-Salmeterol (ADVAIR DISKUS) 100-50 MCG/DOSE AEPB Inhale 1 puff into the lungs every 12 (twelve) hours as needed.       . gabapentin (NEURONTIN) 100 MG capsule Start with 1 tablet by mouth at bedtime, can slowly increase to 2-3 tablets by mouth at bedtime.  990 capsule  5  . hydrochlorothiazide (HYDRODIURIL) 25 MG tablet TAKE 1 TABLET ONCE DAILY.  30 tablet  6  . Menthol, Topical Analgesic, (BIOFREEZE) 4 % GEL As needed       . Misc Natural Products (OSTEO BI-FLEX ADV DOUBLE ST PO) Take 1 tablet by mouth daily.       . Multiple Vitamins-Minerals (CENTRUM SILVER PO) Take 1 capsule by mouth daily.        . NORVASC 5 MG tablet TAKE 1 TABLET ONCE DAILY.  30 tablet  6  . pantoprazole (PROTONIX) 40 MG tablet TAKE 1 TABLET 30 MINUTES BEFORE 1ST MEAL OF THE DAY.  30 tablet  11  . polyethylene glycol (MIRALAX / GLYCOLAX) packet Take 17 g by mouth daily as needed.        No facility-administered encounter medications on file as of 08/17/2013.    Allergies  Allergen Reactions  . Azithromycin     Trouble breathing  . Ciprofloxacin     REACTION: hallucinations  . Latex     rash  . Levaquin [Levofloxacin In D5w]     Causes pt unable to  sleep, legs are tingling, severe indigestion.      Current Medications, Allergies, Past Medical History, Past Surgical History, Family History, and Social History were reviewed in [employer] record.    Review of Systems         See HPI - all other systems neg except as noted... The patient complains of decreased hearing, dyspnea on exertion, muscle weakness, and difficulty walking.  The patient denies anorexia, fever, weight loss, weight gain, vision loss, hoarseness, chest pain, syncope, peripheral edema, prolonged cough, headaches, hemoptysis, abdominal pain, melena, hematochezia, severe indigestion/heartburn, hematuria, incontinence, suspicious skin lesions, transient blindness, depression, unusual weight change, abnormal bleeding, enlarged lymph nodes, and angioedema.     Objective:   Physical Exam     WD, WN, Chr ill appearing 79 y/o WF in NAD... GENERAL:  Alert & oriented; pleasant & cooperative... HEENT:  [place]/AT, EOM-full, EACs-clear, TMs-wnl, NOSE-clear, THROAT-clear & wnl. NECK:  Supple w/ fairROM; no JVD; normal carotid impulses w/o bruits; no thyromegaly or nodules palpated; no lymphadenopathy. CHEST:  Clear to P & A; without wheezes/ rales/ or rhonchi heard... HEART:  Regular Rhythm; without murmurs/ rubs/ or gallops detected... ABDOMEN:  Soft & nontender; normal bowel sounds; no organomegaly or masses palpated... EXT:  mod arthritic changes, walks w/ cane, +venous insuffic & tr edema., scattered varicose veins... NEURO:  CN's intact; motor testing normal; no focal deficits... DERM:   mild intertrig rash under breast, & onychomycosis of toenails...  RADIOLOGY DATA:  Reviewed in the EPIC EMR & discussed w/ the patient...  LABORATORY DATA:  Reviewed in the EPIC EMR & discussed w/ the patient...   Assessment & Plan:    DYSPNEA>  Hx asthma, stable on Advair, Proair; hx anxiety component on Klonopin> symptoms better w/ regular use.  HBP>  Controlled  on Norvasc, HCT, +diet etc...  RBBB>  Aware & denies CP, palpit, ch in DOE, etc...  CHOL>  On diet alone & FLP looks reasonable;  We reviewed low chol, low fat diet...  DM>  A1c is 6.3 on diet alone & we reviewed low carb no sweets etc...  GI> Indigestion, Divertics> she notes most bowel symptoms resolved off spicey food & uses PPI just prn; she was eval by DrPerry.  UTI>  Klebsiella UTI resolved after Septra Rx... She has been eval by DrMacDiarmid.  DJD, LBP, Spinal Stenosis>  Prev evals by Ortho, DrRamos, DrNudelman etc; improved after THR w/ better ambulation; c/o neuropathic discomfort in legs- she will f/u w/ Ramos for shots, try Lyrica50 in the interim...  Anxiety>  The Klonopin Bid helps...   Patient's Medications  New Prescriptions   DOXYCYCLINE (VIBRA-TABS) 100 MG TABLET    Take 1 tablet (100 mg total) by mouth 2 (two) times daily.  Previous Medications   ALBUTEROL (PROAIR HFA) 108 (90 BASE) MCG/ACT INHALER    Inhale 2 puffs into the lungs 4 (four) times daily as needed.     ASPIRIN 81 MG TABLET    Take 81 mg by mouth daily.     CHOLECALCIFEROL (VITAMIN D3) 400 UNITS CAPS    Take 1 capsule by mouth daily.     FERROUS SULFATE DRIED (FEOSOL) 200 (65 FE) MG TABS    Take 1 tablet by mouth daily. With Vit C 500mg  with each dose   FLUTICASONE-SALMETEROL (ADVAIR DISKUS) 100-50 MCG/DOSE AEPB    Inhale 1 puff into the lungs every 12 (twelve) hours as needed.    GABAPENTIN (NEURONTIN) 100 MG CAPSULE    Start with 1 tablet by mouth at bedtime, can slowly increase to 2-3 tablets by mouth at bedtime.   HYDROCHLOROTHIAZIDE (HYDRODIURIL) 25 MG TABLET    TAKE 1 TABLET ONCE DAILY.   MENTHOL, TOPICAL ANALGESIC, (BIOFREEZE) 4 % GEL    As needed    MISC NATURAL PRODUCTS (OSTEO BI-FLEX ADV DOUBLE ST PO)    Take 1 tablet by mouth daily.    MULTIPLE VITAMINS-MINERALS (CENTRUM SILVER PO)    Take 1 capsule by mouth daily.     NORVASC 5 MG TABLET    TAKE 1 TABLET ONCE DAILY.   POLYETHYLENE GLYCOL  (MIRALAX / GLYCOLAX) PACKET    Take 17 g by mouth daily as needed.   Modified Medications   Modified Medication Previous Medication   CELEBREX 200 MG CAPSULE celecoxib (CELEBREX) 200 MG capsule      TAKE 1 CAPSULE ONCE DAILY AS NEEDED FOR PAIN.    Take 1 capsule (200 mg total) by mouth daily as needed for pain. Take one capsule daily as needed  DIPHENHYD-HYDROCORT-NYSTATIN (FIRST-DUKES MOUTHWASH) SUSP Diphenhyd-Hydrocort-Nystatin (FIRST-DUKES MOUTHWASH) SUSP      1 tsp gargle and swallow four times daily as needed    1 tsp gargle and swallow four times daily as needed   PANTOPRAZOLE (PROTONIX) 40 MG TABLET pantoprazole (PROTONIX) 40 MG tablet      TAKE 1 TABLET 30 MINUTES BEFORE 1ST MEAL OF THE DAY.    TAKE 1 TABLET 30 MINUTES BEFORE 1ST MEAL OF THE DAY.  Discontinued Medications   No medications on file

## 2013-08-25 ENCOUNTER — Other Ambulatory Visit: Payer: Self-pay | Admitting: Pulmonary Disease

## 2013-10-09 ENCOUNTER — Other Ambulatory Visit: Payer: Self-pay | Admitting: Pulmonary Disease

## 2013-11-26 ENCOUNTER — Telehealth: Payer: Self-pay | Admitting: Pulmonary Disease

## 2013-11-26 MED ORDER — DOXYCYCLINE HYCLATE 100 MG PO TABS: 100.0000 mg | ORAL_TABLET | Freq: Two times a day (BID) | ORAL | Status: DC

## 2013-11-26 NOTE — Telephone Encounter (Signed)
Per SN---  Call in doxycycline 100 mg  #14  1 po bid Continue with the mucinex, fluids and delsym to help with the cough.     Called and spoke with pts daughter and she is aware of SN recs.  Nothing further is needed

## 2013-11-26 NOTE — Telephone Encounter (Signed)
Called and spoke with pts daughter and she stated that the pt started Monday night with low grade fever, increase SOB, cough with small amount of sputum, nasal congestion, weakness and no appetite.  She has been taking the mucinex to help with the congestion.  Daughter stated she is not sure if this is the flu or not.  SN please advise. Thanks  Allergies  Allergen Reactions  . Azithromycin     Trouble breathing  . Ciprofloxacin     REACTION: hallucinations  . Latex     rash  . Levaquin [Levofloxacin In D5w]     Causes pt unable to sleep, legs are tingling, severe indigestion.       Current Outpatient Prescriptions on File Prior to Visit  Medication Sig Dispense Refill  . albuterol (PROAIR HFA) 108 (90 BASE) MCG/ACT inhaler Inhale 2 puffs into the lungs 4 (four) times daily as needed.        [name]. aspirin 81 MG tablet Take 81 mg by mouth daily.        . CELEBREX 200 MG capsule TAKE 1 CAPSULE ONCE DAILY AS NEEDED FOR PAIN.  30 capsule  6  . Cholecalciferol (VITAMIN D3) 400 UNITS CAPS Take 1 capsule by mouth daily.        . Diphenhyd-Hydrocort-Nystatin (FIRST-DUKES MOUTHWASH) SUSP 1 tsp gargle and swallow four times daily as needed  120 mL  5  . Ferrous Sulfate Dried (FEOSOL) 200 (65 FE) MG TABS Take 1 tablet by mouth daily. With Vit C 500mg  with each dose      . Fluticasone-Salmeterol (ADVAIR DISKUS) 100-50 MCG/DOSE AEPB Inhale 1 puff into the lungs every 12 (twelve) hours as needed.       . gabapentin (NEURONTIN) 100 MG capsule Start with 1 tablet by mouth at bedtime, can slowly increase to 2-3 tablets by mouth at bedtime.  990 capsule  5  . hydrochlorothiazide (HYDRODIURIL) 25 MG tablet TAKE 1 TABLET ONCE DAILY.  30 tablet  6  . Menthol, Topical Analgesic, (BIOFREEZE) 4 % GEL As needed       . Misc Natural Products (OSTEO BI-FLEX ADV DOUBLE ST PO) Take 1 tablet by mouth daily.       . Multiple Vitamins-Minerals (CENTRUM SILVER PO) Take 1 capsule by mouth daily.        . NORVASC 5 MG tablet  TAKE 1 TABLET ONCE DAILY.  30 tablet  6  . pantoprazole (PROTONIX) 40 MG tablet TAKE 1 TABLET 30 MINUTES BEFORE 1ST MEAL OF THE DAY.  30 tablet  6  . polyethylene glycol (MIRALAX / GLYCOLAX) packet Take 17 g by mouth daily as needed.        No current facility-administered medications on file prior to visit.

## 2014-01-21 ENCOUNTER — Telehealth: Payer: Self-pay | Admitting: Pulmonary Disease

## 2014-01-21 NOTE — Telephone Encounter (Signed)
OV has been scheduled with TP tomorrow at 2:45pm. Nothing further was needed.

## 2014-01-22 ENCOUNTER — Ambulatory Visit (INDEPENDENT_AMBULATORY_CARE_PROVIDER_SITE_OTHER)
Admission: RE | Admit: 2014-01-22 | Discharge: 2014-01-22 | Disposition: A | Discharge status: Home / Self Care | Payer: Medicare Other | Source: Ambulatory Visit | Attending: Adult Health | Admitting: Adult Health

## 2014-01-22 ENCOUNTER — Ambulatory Visit (INDEPENDENT_AMBULATORY_CARE_PROVIDER_SITE_OTHER): Payer: Medicare Other | Admitting: Adult Health

## 2014-01-22 ENCOUNTER — Other Ambulatory Visit (INDEPENDENT_AMBULATORY_CARE_PROVIDER_SITE_OTHER): Payer: Medicare Other

## 2014-01-22 ENCOUNTER — Encounter: Payer: Self-pay | Admitting: Adult Health

## 2014-01-22 VITALS — BP 134/70 | HR 88 | Temp 97.6°F | Ht 68.0 in | Wt 176.2 lb

## 2014-01-22 DIAGNOSIS — R0602 Shortness of breath: Secondary | ICD-10-CM

## 2014-01-22 LAB — CBC WITH DIFFERENTIAL/PLATELET
BASOS ABS: 0 10*3/uL (ref 0.0–0.1)
BASOS PCT: 0.6 % (ref 0.0–3.0)
Eosinophils Absolute: 0.1 10*3/uL (ref 0.0–0.7)
Eosinophils Relative: 1.6 % (ref 0.0–5.0)
HCT: 45.2 % (ref 36.0–46.0)
Hemoglobin: 14.9 g/dL (ref 12.0–15.0)
Lymphocytes Relative: 35.7 % (ref 12.0–46.0)
Lymphs Abs: 2.2 10*3/uL (ref 0.7–4.0)
MCHC: 33 g/dL (ref 30.0–36.0)
MCV: 91 fl (ref 78.0–100.0)
MONO ABS: 0.5 10*3/uL (ref 0.1–1.0)
Monocytes Relative: 7.8 % (ref 3.0–12.0)
NEUTROS ABS: 3.4 10*3/uL (ref 1.4–7.7)
Neutrophils Relative %: 54.3 % (ref 43.0–77.0)
Platelets: 168 10*3/uL (ref 150.0–400.0)
RBC: 4.97 Mil/uL (ref 3.87–5.11)
RDW: 14.3 % (ref 11.5–14.6)
WBC: 6.3 10*3/uL (ref 4.5–10.5)

## 2014-01-22 LAB — BASIC METABOLIC PANEL
BUN: 18 mg/dL (ref 6–23)
CO2: 34 mEq/L — ABNORMAL HIGH (ref 19–32)
CREATININE: 1.1 mg/dL (ref 0.4–1.2)
Calcium: 10.1 mg/dL (ref 8.4–10.5)
Chloride: 96 mEq/L (ref 96–112)
GFR: 49.71 mL/min — ABNORMAL LOW (ref >60.00)
Glucose, Bld: 95 mg/dL (ref 70–99)
POTASSIUM: 3.9 meq/L (ref 3.5–5.1)
Sodium: 138 mEq/L (ref 135–145)

## 2014-01-22 LAB — BRAIN NATRIURETIC PEPTIDE: Pro B Natriuretic peptide (BNP): 26 pg/mL (ref 0.0–100.0)

## 2014-01-22 MED ORDER — FUROSEMIDE 20 MG PO TABS: ORAL_TABLET | ORAL | Status: DC

## 2014-01-22 NOTE — Progress Notes (Signed)
Subjective:    Patient ID: [name], female    DOB: 09-17-1923, 80 y.o.   MRN: [id number]  HPI 80 y/o WF she has multiple medical problems as noted below...  Followed for general medical purposes w/ hx chr obstructive asthma, HBP, RBBB, Hypercholesterolemia, borderline DM, DJD, LBP w/ sp stenosis, etc...  01/22/2014 Acute OV  Complains increased dyspnea with walking and more at night over last 6 months . Worse for last 2 weeks.  Legs more swollen esp in evening for few months.  Takes HCTZ daily .  Has dry cough on/off. Felt feverish on/off. No documented fever.  She denies any hemoptysis, discolored mucus, chest pain, abdominal pain, nausea, vomiting. `   PMH:        DYSPNEA (ICD-786.05) - long hx of chronic obstructive asthma treated w/ [id number] & PROAIR (she uses them Prn now)... she is a non-smoker w/ some reactive airways disease in the past & retired from [employer] after 33 years in 1991... she denies cough, sputum, hemoptysis, worsening dyspnea, wheezing, chest pains, snoring, daytime hypersomnolence, etc...  ~  baseline CXR w/o acute changes...  ~  PFT's 5/02 w/ FVC 2.07 (68%), FEV1=1.36 (57%), and FEV1/FVC ratio=66%, mid-flows 42%... ~  CT Angio 7/10 was neg- x biapical pleuroparenchymal scarring... ~  CXR 10/11 showed sl elev right hemidaiph, mild DJD sp, osteopenia, NAD.[name].. ~  CXR 6/12 showed mild apical scarring, clear & NAD, DJD sp w/ osteophytes... ~  Intermittent dyspnea more related to anxiety & treated w/ KLONOPIN 0.5mg  1/2 to 1 tab Bid==> improved. ~  CXR 4/13 showed normal heart size, clear lungs, DJD in TSpine...  HYPERTENSION (ICD-401.9) - controlled on NORVASC 5mg  daily & HCTZ 25mg tab daily...  ~  2/13:  BP 144/70 today> tol rx well & denies HA, visual changes, CP, palipit, dizziness, syncope, edema, etc... ~  4/13:  BP= 148/80 & she denies CP, palpit, edema; dyspnea improved w/ Klonopin. ~  6/13:  BP= 132/68 & as noted she has mult somatic  complaints...  RIGHT BUNDLE BRANCH BLOCK (ICD-426.4) - on ASA 81mg /d... baseline EKG w/ RBBB and 2DEcho 5/02 showed mild asymmetric LVH w/ incr EF...  HYPERLIPIDEMIA (ICD-272.4) - on diet alone... she forgets to come to visits FASTING for this blood work ~  FLP 8/07 showed TChol 205, TG 71, HDL 49, LDL 130... ~  FLP 6/12 on diet alone showed TChol 218, TG 50, HDL 66, LDL 129 ~  FLP 2/13 on diet alone showed TChol 171, TG 43, HDL 66, LDL 97  DIABETES MELLITUS, BORDERLINE (ICD-790.29) - on diet alone w/ prev BS's in the 100-160 range... ~  labs in 2008-9 showed BS= 101 to 108 ~  labs 1/10 showed BS= 106, A1c= 5.9 ~  Labs 6/12 showed BS= 93, A1c= 6.6.[name].[name]. rec diet, exercise... ~  Labs 2/13 showed BS= 92, A1c= 6.3  INDIGESTION/ REFLUX SYMPTOMS >> see 10/12 note & PROTONIX 40mg /d started, further eval if symptoms persist... ~  4/13:  She notes some reflux symptoms and excess gas w/ belching; rec to take the Protonix daily & Simethacone vs Tums which she says helps her gas. ~  5/13:  She saw GI DrPerry w/ rec to take Prilosec for her indigestion...  DIVERTICULOSIS OF COLON (ICD-562.10) - she takes SENAKOT-S, MIRALAX, Peppermint Tea, & sauerkraut Prn...last colonoscopy 9/02 by DrPerry was WNL...  PYELONEPHRITIS (ICD-590.80) - SEE 1/09 Hospitalization (reviewed)... ~  She saw DrMacDiarmid for her recurrent UTIs, chronic cystitis, urge & stress incont, nocturia; she is INTOL  to Cipro & Levaquin...  Hx of BREAST CYST (ICD-610.0)  DEGENERATIVE JOINT DISEASE (ICD-715.90) - s/p right hip hemiarthroplasty 11/09 by DrAplington w/ wound complic... then dx w/ loosening of the femoral shaft & had conversion to right THR by DrAlusio 10/11 & much improved... she uses CELEBREX 200mg  Prn (seldom takes this).  LOW BACK PAIN SYNDROME (ICD-724.2) & SPINAL STENOSIS (ICD-724.00) - severe LBP & spinal stenosis w/ evals by DrRamos & DrNudelman... s/p shots, considering poss surgery vs alternative therapies... she takes  Celebrex, Osteobiflex, MVI, Vit D... ~  8/10: eval by DrAplington- diff leg lengths, lift placed in right shoe, then trial Lyrica50mg ... ~  12/11:  improved after hip revision surg (to THR) 10/11 w/ better ambulaton... ~  5/13:  DrRamos gave her another ESI for her leg pain related to sp stenosis...  Hx of ANEMIA (ICD-285.9) - eval by GI in 2002 showed normal EGD and Colon... prob iron malabsorption problem Rx'd w/ Fe infusion... ~  labs 1/10 showed Hg= 14.6, MCV= 89, Fe= 94 ~  labs 12/11 showed Hg= 12.8, MCV= 90, Fe= 33... try Fe supplement + VitC... ~  Labs 6/12 showed Hg= 15.0 ~  Labs 2/13 showed Hg= 14.6  DERM:  rash Rx'd by dermatology- OLUX-E foam= clobetasol Foam 0.05%...   Past Surgical History  Procedure Laterality Date  . Cataract extraction    . Right hip hemiarthroplasty    . Conversion to right thr      Outpatient Encounter Prescriptions as of 01/22/2014  Medication Sig  . albuterol (PROAIR HFA) 108 (90 BASE) MCG/ACT inhaler Inhale 2 puffs into the lungs 4 (four) times daily as needed.    [name] aspirin 81 MG tablet Take 81 mg by mouth daily.    . CELEBREX 200 MG capsule TAKE 1 CAPSULE ONCE DAILY AS NEEDED FOR PAIN.  . Diphenhyd-Hydrocort-Nystatin (FIRST-DUKES MOUTHWASH) SUSP 1 tsp gargle and swallow four times daily as needed  . Ferrous Sulfate Dried (FEOSOL) 200 (65 FE) MG TABS Take 1 tablet by mouth daily. With Vit C 500mg  with each dose  . Fluticasone-Salmeterol (ADVAIR DISKUS) 100-50 MCG/DOSE AEPB Inhale 1 puff into the lungs every 12 (twelve) hours as needed.   . hydrochlorothiazide (HYDRODIURIL) 25 MG tablet TAKE 1 TABLET ONCE DAILY.  [name] Misc Natural Products (OSTEO BI-FLEX ADV DOUBLE ST PO) Take 1 tablet by mouth daily.   . Multiple Vitamins-Minerals (CENTRUM SILVER PO) Take 1 capsule by mouth daily.    . NORVASC 5 MG tablet TAKE 1 TABLET ONCE DAILY.  . pantoprazole (PROTONIX) 40 MG tablet TAKE 1 TABLET 30 MINUTES BEFORE 1ST MEAL OF THE DAY.  [name] polyethylene glycol  (MIRALAX / GLYCOLAX) packet Take 17 g by mouth daily as needed.   . Cholecalciferol (VITAMIN D3) 400 UNITS CAPS Take 1 capsule by mouth daily.    [name] gabapentin (NEURONTIN) 100 MG capsule Start with 1 tablet by mouth at bedtime, can slowly increase to 2-3 tablets by mouth at bedtime.  . Menthol, Topical Analgesic, (BIOFREEZE) 4 % GEL As needed   . [DISCONTINUED] doxycycline (VIBRA-TABS) 100 MG tablet Take 1 tablet (100 mg total) by mouth 2 (two) times daily.    Allergies  Allergen Reactions  . Azithromycin     Trouble breathing  . Ciprofloxacin     REACTION: hallucinations  . Latex     rash  . Levaquin [Levofloxacin In D5w]     Causes pt unable to sleep, legs are tingling, severe indigestion.      Current Medications, Allergies,  Past Medical History, Past Surgical History, Family History, and Social History were reviewed in [employer] record.    Review of Systems         Constitutional:   No  weight loss, night sweats,  Fevers, chills, + fatigue, or  lassitude.  HEENT:   No headaches,  Difficulty swallowing,  Tooth/dental problems  CV:  No chest pain,  Orthopnea, PND, swelling in lower extremities, anasarca, dizziness, palpitations, syncope.   GI  No   abdominal pain, nausea, vomiting, diarrhea,  , loss of appetite, bloody stools.   Resp:    No wheezing.  No chest wall deformity  Skin: no rash or lesions.  GU: no dysuria, change in color of urine, no urgency or frequency.  No flank pain, no hematuria   MS:  No joint pain or swelling.  No decreased range of motion.   Psych:  No change in mood or affect. No depression or anxiety.  No memory loss.       Objective:   Physical Exam     WD, WN, elderly 80 y/o WF in NAD... GENERAL:  Alert & oriented; pleasant & cooperative... HEENT:  [place]/AT,   EACs-clear, TMs-wnl, NOSE-clear drainage , THROAT-clear & wnl. NECK:  Supple w/ fairROM; no JVD; normal carotid impulses w/o bruits; no thyromegaly or nodules  palpated; no lymphadenopathy. CHEST:  Clear to P & A; without wheezes/ rales/ or rhonchi heard... HEART:  Regular Rhythm; without murmurs/ rubs/ or gallops detected... ABDOMEN:  Soft & nontender; normal bowel sounds; no organomegaly or masses palpated...no guarding or rebound  EXT:  mod arthritic changes, walks w/ cane, +venous insuffic & 1+ edema., scattered varicose veins... NEURO:   no focal deficits... DERM:   No rash     Assessment & Plan:

## 2014-01-22 NOTE — Patient Instructions (Signed)
Stop hydrochlorothiazide. Begin Lasix 40 mg daily for the next 3 days, then 20 mg daily. Low salt diet Labs and chest x-ray today Keep legs elevated as much as possible. Followup in 2 weeks with Dr. Kriste BasqueNadel as planned and as needed Please contact office for sooner follow up if symptoms do not improve or worsen or seek emergency care

## 2014-01-22 NOTE — Assessment & Plan Note (Signed)
Progressive Dyspnea ? Etiology . Appears to have worsening Leg swelling  Check bnp and cxr  Change diuretic  Hold on abx as pt says she has severe allergies to most abx  Check cbc/cxr today .   Plan  Stop hydrochlorothiazide. Begin Lasix 40 mg daily for the next 3 days, then 20 mg daily. Low salt diet Labs and chest x-ray today Keep legs elevated as much as possible. Followup in 2 weeks with Dr. Kriste BasqueNadel as planned and as needed Please contact office for sooner follow up if symptoms do not improve or worsen or seek emergency care

## 2014-01-26 NOTE — Progress Notes (Signed)
Quick Note:  Called spoke with patient's daughter [name], advised of lab results / recs as stated by TP. [name] verbalized her understanding and denied any questions. ______

## 2014-01-26 NOTE — Progress Notes (Signed)
Quick Note:  Called spoke with patient's daughter [name], advised of cxr results / recs as stated by TP. [name] verbalized her understanding and denied any questions. ______

## 2014-02-11 ENCOUNTER — Other Ambulatory Visit: Payer: Self-pay | Admitting: Pulmonary Disease

## 2014-02-11 MED ORDER — AMLODIPINE BESYLATE 5 MG PO TABS: ORAL_TABLET | ORAL | Status: DC

## 2014-02-15 ENCOUNTER — Ambulatory Visit (INDEPENDENT_AMBULATORY_CARE_PROVIDER_SITE_OTHER): Payer: Medicare Other | Admitting: Pulmonary Disease

## 2014-02-15 ENCOUNTER — Encounter: Payer: Self-pay | Admitting: Pulmonary Disease

## 2014-02-15 VITALS — BP 136/70 | HR 67 | Temp 97.5°F | Ht 68.0 in | Wt 178.6 lb

## 2014-02-15 DIAGNOSIS — E785 Hyperlipidemia, unspecified: Secondary | ICD-10-CM

## 2014-02-15 DIAGNOSIS — M545 Low back pain, unspecified: Secondary | ICD-10-CM

## 2014-02-15 DIAGNOSIS — R269 Unspecified abnormalities of gait and mobility: Secondary | ICD-10-CM

## 2014-02-15 DIAGNOSIS — I10 Essential (primary) hypertension: Secondary | ICD-10-CM

## 2014-02-15 DIAGNOSIS — M4800 Spinal stenosis, site unspecified: Secondary | ICD-10-CM

## 2014-02-15 DIAGNOSIS — M1990 Unspecified osteoarthritis, unspecified site: Secondary | ICD-10-CM

## 2014-02-15 DIAGNOSIS — K219 Gastro-esophageal reflux disease without esophagitis: Secondary | ICD-10-CM

## 2014-02-15 DIAGNOSIS — K5730 Diverticulosis of large intestine without perforation or abscess without bleeding: Secondary | ICD-10-CM

## 2014-02-15 DIAGNOSIS — I4510 Unspecified right bundle-branch block: Secondary | ICD-10-CM

## 2014-02-15 DIAGNOSIS — R7309 Other abnormal glucose: Secondary | ICD-10-CM

## 2014-02-15 MED ORDER — FUROSEMIDE 20 MG PO TABS: ORAL_TABLET | ORAL | Status: DC

## 2014-02-15 MED ORDER — LOSARTAN POTASSIUM 50 MG PO TABS: 50.0000 mg | ORAL_TABLET | Freq: Every day | ORAL | Status: DC

## 2014-02-15 NOTE — Progress Notes (Signed)
Subjective:    Patient ID: [name], female    DOB: 1923/06/09, 80 y.o.   MRN: [id number]  HPI 80 y/o WF here for a follow up visit... she has multiple medical problems as noted below...  Followed for general medical purposes w/ hx chr obstructive asthma, HBP, RBBB, Hypercholesterolemia, borderline DM, DJD, LBP w/ sp stenosis, etc...  ~  May 13, 2012:  40moROV & she has had multiple subspecialty visits in the interval (see below)> in addition she is c/o "my blood veins are coming back out" noting some swelling in the legs but she can't wear any support hose due to discomfort- I explained that she is then relegated to no salt & elevation for control...    She saw GI> DrPerry & PGuenter in April/May for indigestion; she had mult complaints, most improved w/ Prilosec..[name]    She saw DrMacDiarmid 5/13 for Urology f/u of her recurrent UTIs, chronic cystitis, urge & stress incont, nocturia> hx intol Cipro/ Levaquin, she did tol Septra, he is following her cultures & discussed asymptomatic bacturia w/ them...    She saw DrRamos 5/13 for leg pain> he felt her leg discomfort was coming from lumbar DDD w/ spinal stenosis at L3-5 & L4-5; he gave her another [facility] continues to follow her progress; she had seen DrNudelman several yrs ago as well... We reviewed prob list, meds, xrays and labs> see below>>  ~  September 16, 2012:  426moOV & [name] notes sl better w/ her exercise program, notes DOE but mowing on her riding mower etc;  Stable on her Advair100, ProairHFA, MMW... BP controlled on meds, Chol & DM controlled on diet alone, GI & GU stable and she saw Urology, DrMacDiarmid- hx recurrent UTIs, chr cystitis, urge&stress incont... She also has DJD, LBP, spinal stenosis w/ ESI L5-S1 from DrRamos but not much relief she says & uses Celebrex prn... Hx panic attacks that required her to go outside for "air" & notes the "prilosec" helps ?!*    We reviewed prob list, meds, xrays and labs> see below for updates  >> OK Flu shot today...  ~  February 10, 2013:  13m21moV & Analisia's CC is pain & numbness in her legs- very uncomfortable, knees down, feet burning, & feel cold most of the time, awaiting shot in her back from DrRamos; we discussed trial [id number] the interim... We reviewed the following medical problems during today's office visit >>     Chr Obstructive Asthma> stable on Advair100 & Proventil prn; notes breathing at baseline & Prn Klonopin helps dyspnea...    HBP> on Norvasc10, HCTZ25; BP=136/60, tol meds well; denies CP, palpit, ch in SOB, edema, etc...    CHOL> on diet alone, refuses meds, FLP 3/14 shows TChol 177, TG 50, HDL 60, LDL 107    DM> on diet alone, wt stable ~174#, BS=97, last A1c (2/13) was 6.3 & she knows to restrict carbs etc...    GI- Reflux, Divertics, constip> on Protonix40, Miralax, Senakot-S; continue same meds..   Marland KitchenDJD/ LBP> on Celebrex & osteobiflex prn; had right THR 2011; known sp stenosis w/ prev ESI... We reviewed prob list, meds, xrays and labs> see below for updates >>   CXR 2/14 showed normal heart size, clear lungs w/ sl peribronch thickening, DJD in spine, NAD...  LABS 3/14:  FLP- at goals on diet x LDL=107;  Chems- ok x TCO2=36;  CBC- wnl;  TSH=1.93;  UA- clear...  ~  August 17, 2013:  275mo ROV & Janaysia continues to do well at 79; no new complaints or concerns...    COPD stable on Advair100 & only using it prn she says; no recent resp exac & denies cough, sput, hemoptysis, SOB, CP, etc...    BP controlled on Amlod5 & Hct25-1/2 daily; BP= 130/70 & she denies CP, palpit, dizzy, SOB, edema, etc...     Chol & BS controlled on diet alone; weight is down 4# to 169# today...    GI is stable on Protonix40, antiireflux regimen, & Miralax...     DJD/ LBP treated w/ Vits, Osteobiflex, Celebrex200 prn & Neurontin100 prn- this is the way she likes it! We reviewed prob list, meds, xrays and labs> see below for updates >> ok 2014 Flu vaccine today...  ~  February 15, 2014:  275mo ROV & [name] recently saw TP w/ incr SOB/DOE walking & Qhs, swelling in legs, etc; CXR & Labs were ok; she changed her Hct to Lasix & improved... We reviewed the following medical problems during today's office visit >>     Chr Obstructive Asthma> stable on Advair100 (but only using it prn) & Proventil prn; notes breathing back to baseline & Prn Klonopin helps dyspnea...    HBP> on ASA81, Norvasc5, Lasix20- 1-2/d; BP=136/70, tol meds well; denies CP, palpit, ch in SOB, but notes persist edema in legs; Rec to ch Amlod5 to Losar50, low sodium, elev legs, Lasix40...    CHOL> on diet alone, refuses meds, FLP 3/14 shows TChol 177, TG 50, HDL 60, LDL 107    DM> on diet alone, wt up slightly to  ~179#, BS=95, last A1c (2/13) was 6.3 & she knows to restrict carbs etc...    GI- Reflux, Divertics, constip> on Protonix40, Miralax, Senakot-S; continue same meds.[name]    DJD/ LBP> on Celebrex & osteobiflex prn; had right THR 2011; known sp stenosis w/ prev ESI... We reviewed prob list, meds, xrays and labs> see below for updates >>   CXR 3/15 showed norm heart size, clear lungs, elev of right hemidiaph, NAD...  LABS 3/15:  Chems- wnl;  CBC- wnl;  BNP=26...          Problem List:       DYSPNEA (ICD-786.05) - long hx of chronic obstructive asthma treated w/ ADVAIR100Bid & PROAIR (she uses them Prn now)... she is a non-smoker w/ some reactive airways disease in the past & retired from [employer] after 33 years in 1991... she denies cough, sputum, hemoptysis, worsening dyspnea, wheezing, chest pains, snoring, daytime hypersomnolence, etc...  ~  baseline CXR w/o acute changes...  ~  PFT's 5/02 w/ FVC 2.07 (68%), FEV1=1.36 (57%), and FEV1/FVC ratio=66%, mid-flows 42%... ~  CT Angio 7/10 was neg- x biapical pleuroparenchymal scarring... ~  CXR 10/11 showed sl elev right hemidaiph, mild DJD sp, osteopenia, NAD.[name]. ~  CXR 6/12 showed mild apical scarring, clear & NAD, DJD sp w/ osteophytes... ~  Intermittent  dyspnea more related to anxiety & treated w/ KLONOPIN 0.5mg  1/2 to 1 tab Bid==> improved. ~  CXR 4/13 showed normal heart size, clear lungs, DJD in TSpine... ~  CXR 2/14 showed normal heart size, clear lungs w/ sl peribronch thickening, DJD in spine, NAD.[name]. ~  CXR 3/15 showed norm heart size, clear lungs, elev of right hemidiaph, NAD...  HYPERTENSION (ICD-401.9) - controlled on NORVASC 5mg  daily & HCTZ 25mg tab daily...  ~  2/13:  BP 144/70 today> tol rx well & denies HA, visual changes, CP, palipit, dizziness, syncope,  edema, etc... ~  4/13:  BP= 148/80 & she denies CP, palpit, edema; dyspnea improved w/ Klonopin. ~  6/13:  BP= 132/68 & as noted she has mult somatic complaints... ~  3/14:  on Norvasc10, HCTZ25; BP=136/60, tol meds well; denies CP, palpit, ch in SOB, edema, etc  ~  9/14:  BP controlled on Amlod5 & Hct25-1/2 daily; BP= 130/70 & she denies CP, palpit, dizzy, SOB, edema, etc. ~  3/15: on ASA81, Norvasc5, Lasix20- 1-2/d; BP=136/70, tol meds well; denies CP, palpit, ch in SOB, but notes persist edema in legs; Rec to ch Amlod5 to Losar50, low sodium, elev legs, Lasix40.  RIGHT BUNDLE BRANCH BLOCK (ICD-426.4) - on ASA 81mg /d... baseline EKG w/ RBBB and 2DEcho 5/02 showed mild asymmetric LVH w/ incr EF...  HYPERLIPIDEMIA (ICD-272.4) - on diet alone... she forgets to come to visits FASTING for this blood work ~  FLP 8/07 showed TChol 205, TG 71, HDL 49, LDL 130... ~  FLP 6/12 on diet alone showed TChol 218, TG 50, HDL 66, LDL 129 ~  FLP 2/13 on diet alone showed TChol 171, TG 43, HDL 66, LDL 97 ~  FLP 3/14 on diet alone showed TChol 177, TG 50, HDL 60, LDL 107  DIABETES MELLITUS, BORDERLINE (ICD-790.29) - on diet alone w/ prev BS's in the 100-160 range... ~  labs in 2008-9 showed BS= 101 to 108 ~  labs 1/10 showed BS= 106, A1c= 5.9 ~  Labs 6/12 showed BS= 93, A1c= 6.6.Marland KitchenMarland Kitchen rec diet, exercise... ~  Labs 2/13 showed BS= 92, A1c= 6.3 ~  3/14: on diet alone, wt stable ~174#, BS=97, last  A1c (2/13) was 6.3 & she knows to restrict carbs etc.  INDIGESTION/ REFLUX SYMPTOMS >> see 10/12 note & PROTONIX 40mg /d started, further eval if symptoms persist... ~  4/13:  She notes some reflux symptoms and excess gas w/ belching; rec to take the Protonix daily & Simethacone vs Tums which she says helps her gas. ~  5/13:  She saw GI DrPerry w/ rec to take Prilosec for her indigestion...  DIVERTICULOSIS OF COLON (ICD-562.10) - she takes SENAKOT-S, MIRALAX, Peppermint Tea, & sauerkraut Prn...last colonoscopy 9/02 by DrPerry was WNL...  PYELONEPHRITIS (ICD-590.80) - SEE 1/09 Hospitalization (reviewed)... ~  She saw DrMacDiarmid for her recurrent UTIs, chronic cystitis, urge & stress incont, nocturia; she is INTOL to Cipro & Levaquin...  Hx of BREAST CYST (ICD-610.0)  DEGENERATIVE JOINT DISEASE (ICD-715.90) - s/p right hip hemiarthroplasty 11/09 by DrAplington w/ wound complic... then dx w/ loosening of the femoral shaft & had conversion to right THR by DrAlusio 10/11 & much improved... she uses CELEBREX 200mg  Prn (seldom takes this).  LOW BACK PAIN SYNDROME (ICD-724.2) & SPINAL STENOSIS (ICD-724.00) - severe LBP & spinal stenosis w/ evals by DrRamos & DrNudelman... s/p shots, considering poss surgery vs alternative therapies... she takes Celebrex, Osteobiflex, MVI, Vit D... ~  8/10: eval by DrAplington- diff leg lengths, lift placed in right shoe, then trial Lyrica50mg ... ~  12/11:  improved after hip revision surg (to THR) 10/11 w/ better ambulaton... ~  5/13:  DrRamos gave her another ESI for her leg pain related to sp stenosis... ~  3/14:  C/o neuropathic discomfort in legs- eval by Ramos & offered shots in her back; try Lyrica50 in the interim...  Hx of ANEMIA (ICD-285.9) - eval by GI in 2002 showed normal EGD and Colon... prob iron malabsorption problem Rx'd w/ Fe infusion... ~  labs 1/10 showed Hg= 14.6, MCV= 89, Fe= 94 ~  labs 12/11 showed Hg= 12.8, MCV= 90, Fe= 33... try Fe supplement  + VitC... ~  Labs 6/12 showed Hg= 15.0 ~  Labs 2/13 showed Hg= 14.6 ~  Labs 2/14 showed Hg= 14.8 ~  Labs 3/15 showed Hg= 14.9  DERM:  rash Rx'd by dermatology- OLUX-E foam= clobetasol Foam 0.05%...   Past Surgical History  Procedure Laterality Date  . Cataract extraction    . Right hip hemiarthroplasty    . Conversion to right thr      Outpatient Encounter Prescriptions as of 02/15/2014  Medication Sig  . albuterol (PROAIR HFA) 108 (90 BASE) MCG/ACT inhaler Inhale 2 puffs into the lungs 4 (four) times daily as needed.    [name] amLODipine (NORVASC) 5 MG tablet TAKE 1 TABLET ONCE DAILY.  [name] aspirin 81 MG tablet Take 81 mg by mouth daily.    . CELEBREX 200 MG capsule TAKE 1 CAPSULE ONCE DAILY AS NEEDED FOR PAIN.  [name] Cholecalciferol (VITAMIN D3) 400 UNITS CAPS Take 1 capsule by mouth daily.    . Diphenhyd-Hydrocort-Nystatin (FIRST-DUKES MOUTHWASH) SUSP 1 tsp gargle and swallow four times daily as needed  . Ferrous Sulfate Dried (FEOSOL) 200 (65 FE) MG TABS Take 1 tablet by mouth daily. With Vit C 500mg  with each dose  . Fluticasone-Salmeterol (ADVAIR DISKUS) 100-50 MCG/DOSE AEPB Inhale 1 puff into the lungs every 12 (twelve) hours as needed.   . furosemide (LASIX) 20 MG tablet 1-2 tabs daily as directed.  . gabapentin (NEURONTIN) 100 MG capsule Start with 1 tablet by mouth at bedtime, can slowly increase to 2-3 tablets by mouth at bedtime.  . Menthol, Topical Analgesic, (BIOFREEZE) 4 % GEL As needed   . Misc Natural Products (OSTEO BI-FLEX ADV DOUBLE ST PO) Take 1 tablet by mouth daily.   . Multiple Vitamins-Minerals (CENTRUM SILVER PO) Take 1 capsule by mouth daily.    . pantoprazole (PROTONIX) 40 MG tablet TAKE 1 TABLET 30 MINUTES BEFORE 1ST MEAL OF THE DAY.  [name] polyethylene glycol (MIRALAX / GLYCOLAX) packet Take 17 g by mouth daily as needed.     Allergies  Allergen Reactions  . Azithromycin     Trouble breathing  . Ciprofloxacin     REACTION: hallucinations  . Latex     rash  .  Levaquin [Levofloxacin In D5w]     Causes pt unable to sleep, legs are tingling, severe indigestion.      Current Medications, Allergies, Past Medical History, Past Surgical History, Family History, and Social History were reviewed in [employer] record.    Review of Systems         See HPI - all other systems neg except as noted... The patient complains of decreased hearing, dyspnea on exertion, muscle weakness, and difficulty walking.  The patient denies anorexia, fever, weight loss, weight gain, vision loss, hoarseness, chest pain, syncope, peripheral edema, prolonged cough, headaches, hemoptysis, abdominal pain, melena, hematochezia, severe indigestion/heartburn, hematuria, incontinence, suspicious skin lesions, transient blindness, depression, unusual weight change, abnormal bleeding, enlarged lymph nodes, and angioedema.     Objective:   Physical Exam     WD, WN, Chr ill appearing 80 y/o WF in NAD... GENERAL:  Alert & oriented; pleasant & cooperative... HEENT:  [place]/AT, EOM-full, EACs-clear, TMs-wnl, NOSE-clear, THROAT-clear & wnl. NECK:  Supple w/ fairROM; no JVD; normal carotid impulses w/o bruits; no thyromegaly or nodules palpated; no lymphadenopathy. CHEST:  Clear to P & A; without wheezes/ rales/ or rhonchi heard... HEART:  Regular Rhythm; without murmurs/  rubs/ or gallops detected... ABDOMEN:  Soft & nontender; normal bowel sounds; no organomegaly or masses palpated... EXT:  mod arthritic changes, walks w/ cane, +venous insuffic & tr edema., scattered varicose veins... NEURO:  CN's intact; motor testing normal; no focal deficits... DERM:   mild intertrig rash under breast, & onychomycosis of toenails...  RADIOLOGY DATA:  Reviewed in the EPIC EMR & discussed w/ the patient...  LABORATORY DATA:  Reviewed in the EPIC EMR & discussed w/ the patient...   Assessment & Plan:    DYSPNEA>  Hx asthma, stable on Advair, Proair; hx anxiety component on  Klonopin> symptoms better w/ regular use.  HBP>  Controlled on Norvasc, Lasix, +diet etc; but still w/ edema so we decided to stop Amlod5, add Losar50, incr Lasix40/d, plus no salt, elevate, etc...  RBBB>  Aware & denies CP, palpit, ch in DOE, etc...  CHOL>  On diet alone & FLP looks reasonable;  We reviewed low chol, low fat diet...  DM>  A1c is 6.3 on diet alone & we reviewed low carb no sweets etc...  GI> Indigestion, Divertics> she notes most bowel symptoms resolved off spicey food & uses PPI just prn; she was eval by DrPerry.  UTI>  Klebsiella UTI resolved after Septra Rx... She has been eval by DrMacDiarmid.  DJD, LBP, Spinal Stenosis>  Prev evals by Ortho, DrRamos, DrNudelman etc; improved after THR w/ better ambulation; c/o neuropathic discomfort in legs- she will f/u w/ Ramos for shots, try Lyrica50 in the interim...  Anxiety>  The Klonopin Bid helps...   Patient's Medications  New Prescriptions   LOSARTAN (COZAAR) 50 MG TABLET    Take 1 tablet (50 mg total) by mouth daily.   SULFAMETHOXAZOLE-TRIMETHOPRIM (SEPTRA DS) 800-160 MG PER TABLET    Take 1 tablet by mouth 2 (two) times daily.  Previous Medications   ALBUTEROL (PROAIR HFA) 108 (90 BASE) MCG/ACT INHALER    Inhale 2 puffs into the lungs 4 (four) times daily as needed.     ASPIRIN 81 MG TABLET    Take 81 mg by mouth daily.     CELEBREX 200 MG CAPSULE    TAKE 1 CAPSULE ONCE DAILY AS NEEDED FOR PAIN.   CHOLECALCIFEROL (VITAMIN D3) 400 UNITS CAPS    Take 1 capsule by mouth daily.     DIPHENHYD-HYDROCORT-NYSTATIN (FIRST-DUKES MOUTHWASH) SUSP    1 tsp gargle and swallow four times daily as needed   FERROUS SULFATE DRIED (FEOSOL) 200 (65 FE) MG TABS    Take 1 tablet by mouth daily. With Vit C 500mg  with each dose   FLUTICASONE-SALMETEROL (ADVAIR DISKUS) 100-50 MCG/DOSE AEPB    Inhale 1 puff into the lungs every 12 (twelve) hours as needed.    GABAPENTIN (NEURONTIN) 100 MG CAPSULE    Start with 1 tablet by mouth at bedtime, can  slowly increase to 2-3 tablets by mouth at bedtime.   MENTHOL, TOPICAL ANALGESIC, (BIOFREEZE) 4 % GEL    As needed    MISC NATURAL PRODUCTS (OSTEO BI-FLEX ADV DOUBLE ST PO)    Take 1 tablet by mouth daily.    MULTIPLE VITAMINS-MINERALS (CENTRUM SILVER PO)    Take 1 capsule by mouth daily.     PANTOPRAZOLE (PROTONIX) 40 MG TABLET    TAKE 1 TABLET 30 MINUTES BEFORE 1ST MEAL OF THE DAY.   POLYETHYLENE GLYCOL (MIRALAX / GLYCOLAX) PACKET    Take 17 g by mouth daily as needed.   Modified Medications   Modified Medication Previous Medication  FUROSEMIDE (LASIX) 20 MG TABLET furosemide (LASIX) 20 MG tablet      Take 2 tablets by mouth every morning.    1-2 tabs daily as directed.  Discontinued Medications   AMLODIPINE (NORVASC) 5 MG TABLET    TAKE 1 TABLET ONCE DAILY.

## 2014-02-15 NOTE — Patient Instructions (Signed)
Today we updated your med list in our EPIC system...    Continue your current medications the same...  For your swelling>>    Eliminate the salt from your diet!!!    Elevate your legs...    Wear support hose (15-2720mm Hg compression=> 20-5230mmg Hg compression)    Take the LASIX 20mg  tabs- 2 tabs each AM for now...  We are also changing your Norvasc (Amlodipine) to LOSARTAN 50mg  one tab daily...  You should have a folllow up recheck in 6-8 weeks...  We should plan to recheck your breathing in about 54mo- sooner if needed for any problems.[name]..[name]

## 2014-02-25 ENCOUNTER — Other Ambulatory Visit (INDEPENDENT_AMBULATORY_CARE_PROVIDER_SITE_OTHER): Payer: Medicare Other

## 2014-02-25 ENCOUNTER — Telehealth: Payer: Self-pay | Admitting: Pulmonary Disease

## 2014-02-25 DIAGNOSIS — N390 Urinary tract infection, site not specified: Secondary | ICD-10-CM

## 2014-02-25 LAB — URINALYSIS, ROUTINE W REFLEX MICROSCOPIC
Bilirubin Urine: NEGATIVE
Hgb urine dipstick: NEGATIVE
Ketones, ur: NEGATIVE
NITRITE: NEGATIVE
PH: 7 (ref 5.0–8.0)
RBC / HPF: NONE SEEN (ref 0–?)
SPECIFIC GRAVITY, URINE: 1.01 (ref 1.000–1.030)
Total Protein, Urine: NEGATIVE
Urine Glucose: NEGATIVE
Urobilinogen, UA: 0.2 (ref 0.0–1.0)

## 2014-02-25 MED ORDER — SULFAMETHOXAZOLE-TRIMETHOPRIM 800-160 MG PO TABS: 1.0000 | ORAL_TABLET | Freq: Two times a day (BID) | ORAL | Status: DC

## 2014-02-25 NOTE — Telephone Encounter (Signed)
Per SN---  Ok for the pt to come in and leave UA and urine culture.    Then start on septra ds  #14  1 po bid Try to take the lasix 20 mg in the am and extra lasix 20 mg  In the pm for swelling.

## 2014-02-25 NOTE — Telephone Encounter (Signed)
Per OV w/ SN 02/15/14: Patient Instructions      Today we updated your med list in our EPIC system...    Continue your current medications the same.. For your swelling>>    Eliminate the salt from your diet!!!    Elevate your legs...    Wear support hose (15-4720mm Hg compression=> 20-7130mmg Hg compression)    Take the LASIX 20mg  tabs- 2 tabs each AM for now... We are also changing your Norvasc (Amlodipine) to LOSARTAN 50mg  one tab daily... You should have a folllow up recheck in 6-8 weeks... We should plan to recheck your breathing in about 24mo- sooner if needed for any problems...  --  Spoke with daughter. She reports last night pt leg swelling has increased. The right leg looked more swollen than the left. She was experiencing SOB last night, very slight chest tx, looks little pale. Pt is taking lasix lasix 20 QD. When taking 2 of the lasix's made her very weak. Pt is keeping legs elevated, no salt in diet at all per daughter. Pt had left over turnip salad yesterday which had lots of vinegar in it. Pt daughter is requesting recs. Please advise SN thanks  Allergies  Allergen Reactions  . Azithromycin     Trouble breathing  . Ciprofloxacin     REACTION: hallucinations  . Latex     rash  . Levaquin [Levofloxacin In D5w]     Causes pt unable to sleep, legs are tingling, severe indigestion.      Current Outpatient Prescriptions on File Prior to Visit  Medication Sig Dispense Refill  . albuterol (PROAIR HFA) 108 (90 BASE) MCG/ACT inhaler Inhale 2 puffs into the lungs 4 (four) times daily as needed.        [name]. aspirin 81 MG tablet Take 81 mg by mouth daily.        . CELEBREX 200 MG capsule TAKE 1 CAPSULE ONCE DAILY AS NEEDED FOR PAIN.  30 capsule  6  . Cholecalciferol (VITAMIN D3) 400 UNITS CAPS Take 1 capsule by mouth daily.        . Diphenhyd-Hydrocort-Nystatin (FIRST-DUKES MOUTHWASH) SUSP 1 tsp gargle and swallow four times daily as needed  120 mL  5  . Ferrous Sulfate Dried (FEOSOL) 200  (65 FE) MG TABS Take 1 tablet by mouth daily. With Vit C 500mg  with each dose      . Fluticasone-Salmeterol (ADVAIR DISKUS) 100-50 MCG/DOSE AEPB Inhale 1 puff into the lungs every 12 (twelve) hours as needed.       . furosemide (LASIX) 20 MG tablet Take 2 tablets by mouth every morning.  60 tablet  5  . gabapentin (NEURONTIN) 100 MG capsule Start with 1 tablet by mouth at bedtime, can slowly increase to 2-3 tablets by mouth at bedtime.  990 capsule  5  . losartan (COZAAR) 50 MG tablet Take 1 tablet (50 mg total) by mouth daily.  30 tablet  6  . Menthol, Topical Analgesic, (BIOFREEZE) 4 % GEL As needed       . Misc Natural Products (OSTEO BI-FLEX ADV DOUBLE ST PO) Take 1 tablet by mouth daily.       . Multiple Vitamins-Minerals (CENTRUM SILVER PO) Take 1 capsule by mouth daily.        . pantoprazole (PROTONIX) 40 MG tablet TAKE 1 TABLET 30 MINUTES BEFORE 1ST MEAL OF THE DAY.  30 tablet  6  . polyethylene glycol (MIRALAX / GLYCOLAX) packet Take 17 g by mouth daily as needed.  No current facility-administered medications on file prior to visit.

## 2014-02-25 NOTE — Telephone Encounter (Signed)
Spoke with [name] and notified of recs per SN  She verbalized understanding  Rx was sent to pharm  Order placed for UA and CX

## 2014-02-25 NOTE — Telephone Encounter (Signed)
I spoke with pt daughter again and she is asking for the pt to come in and leave a urine sample because she states the last time the pt had a UTI she had similar symptoms as she is having now so she would like her urine checked along with any other recs SN may have. Carron CurieJennifer Atiba Kimberlin, CMA

## 2014-02-25 NOTE — Telephone Encounter (Signed)
Daughter calling back stating. Patient daughter feels patient has an UTI.  Asking to come in for urine culture.

## 2014-02-26 LAB — CULTURE, URINE COMPREHENSIVE
Colony Count: NO GROWTH
ORGANISM ID, BACTERIA: NO GROWTH

## 2014-03-01 ENCOUNTER — Telehealth: Payer: Self-pay | Admitting: Pulmonary Disease

## 2014-03-01 NOTE — Telephone Encounter (Signed)
Spoke with pt daughter. Made her aware no referral is placed they just call for appt. Nothing further needed

## 2014-04-14 ENCOUNTER — Encounter: Payer: Self-pay | Admitting: Pulmonary Disease

## 2014-04-14 ENCOUNTER — Telehealth: Payer: Self-pay | Admitting: Pulmonary Disease

## 2014-04-14 ENCOUNTER — Ambulatory Visit (INDEPENDENT_AMBULATORY_CARE_PROVIDER_SITE_OTHER): Payer: Medicare Other | Admitting: Pulmonary Disease

## 2014-04-14 VITALS — BP 138/80 | HR 77 | Temp 97.7°F | Ht 68.0 in | Wt 169.0 lb

## 2014-04-14 DIAGNOSIS — M545 Low back pain, unspecified: Secondary | ICD-10-CM

## 2014-04-14 DIAGNOSIS — I10 Essential (primary) hypertension: Secondary | ICD-10-CM

## 2014-04-14 DIAGNOSIS — M1990 Unspecified osteoarthritis, unspecified site: Secondary | ICD-10-CM

## 2014-04-14 DIAGNOSIS — R1310 Dysphagia, unspecified: Secondary | ICD-10-CM

## 2014-04-14 DIAGNOSIS — K5730 Diverticulosis of large intestine without perforation or abscess without bleeding: Secondary | ICD-10-CM

## 2014-04-14 DIAGNOSIS — K5900 Constipation, unspecified: Secondary | ICD-10-CM

## 2014-04-14 DIAGNOSIS — R7309 Other abnormal glucose: Secondary | ICD-10-CM

## 2014-04-14 DIAGNOSIS — E785 Hyperlipidemia, unspecified: Secondary | ICD-10-CM

## 2014-04-14 DIAGNOSIS — R269 Unspecified abnormalities of gait and mobility: Secondary | ICD-10-CM

## 2014-04-14 DIAGNOSIS — K219 Gastro-esophageal reflux disease without esophagitis: Secondary | ICD-10-CM

## 2014-04-14 DIAGNOSIS — M4800 Spinal stenosis, site unspecified: Secondary | ICD-10-CM

## 2014-04-14 DIAGNOSIS — I872 Venous insufficiency (chronic) (peripheral): Secondary | ICD-10-CM | POA: Insufficient documentation

## 2014-04-14 DIAGNOSIS — F411 Generalized anxiety disorder: Secondary | ICD-10-CM

## 2014-04-14 DIAGNOSIS — F419 Anxiety disorder, unspecified: Secondary | ICD-10-CM

## 2014-04-14 MED ORDER — FLUTICASONE-SALMETEROL 100-50 MCG/DOSE IN AEPB: 1.0000 | INHALATION_SPRAY | Freq: Two times a day (BID) | RESPIRATORY_TRACT | Status: DC | PRN

## 2014-04-14 MED ORDER — ALBUTEROL SULFATE HFA 108 (90 BASE) MCG/ACT IN AERS: 2.0000 | INHALATION_SPRAY | Freq: Four times a day (QID) | RESPIRATORY_TRACT | Status: DC | PRN

## 2014-04-14 MED ORDER — PANTOPRAZOLE SODIUM 40 MG PO TBEC: DELAYED_RELEASE_TABLET | ORAL | Status: DC

## 2014-04-14 MED ORDER — FUROSEMIDE 40 MG PO TABS: 40.0000 mg | ORAL_TABLET | Freq: Every day | ORAL | Status: DC

## 2014-04-14 NOTE — Progress Notes (Signed)
Subjective:    Patient ID: [name], female    DOB: 1923/06/09, 80 y.o.   MRN: [id number]  HPI 80 y/o WF here for a follow up visit... she has multiple medical problems as noted below...  Followed for general medical purposes w/ hx chr obstructive asthma, HBP, RBBB, Hypercholesterolemia, borderline DM, DJD, LBP w/ sp stenosis, etc...  ~  May 13, 2012:  40moROV & she has had multiple subspecialty visits in the interval (see below)> in addition she is c/o "my blood veins are coming back out" noting some swelling in the legs but she can't wear any support hose due to discomfort- I explained that she is then relegated to no salt & elevation for control...    She saw GI> DrPerry & PGuenter in April/May for indigestion; she had mult complaints, most improved w/ Prilosec..[name]    She saw DrMacDiarmid 5/13 for Urology f/u of her recurrent UTIs, chronic cystitis, urge & stress incont, nocturia> hx intol Cipro/ Levaquin, she did tol Septra, he is following her cultures & discussed asymptomatic bacturia w/ them...    She saw DrRamos 5/13 for leg pain> he felt her leg discomfort was coming from lumbar DDD w/ spinal stenosis at L3-5 & L4-5; he gave her another [facility] continues to follow her progress; she had seen DrNudelman several yrs ago as well... We reviewed prob list, meds, xrays and labs> see below>>  ~  September 16, 2012:  426moOV & [name] notes sl better w/ her exercise program, notes DOE but mowing on her riding mower etc;  Stable on her Advair100, ProairHFA, MMW... BP controlled on meds, Chol & DM controlled on diet alone, GI & GU stable and she saw Urology, DrMacDiarmid- hx recurrent UTIs, chr cystitis, urge&stress incont... She also has DJD, LBP, spinal stenosis w/ ESI L5-S1 from DrRamos but not much relief she says & uses Celebrex prn... Hx panic attacks that required her to go outside for "air" & notes the "prilosec" helps ?!*    We reviewed prob list, meds, xrays and labs> see below for updates  >> OK Flu shot today...  ~  February 10, 2013:  13m21moV & [name]'s CC is pain & numbness in her legs- very uncomfortable, knees down, feet burning, & feel cold most of the time, awaiting shot in her back from DrRamos; we discussed trial Lyrica50m68m the interim... We reviewed the following medical problems during today's office visit >>     Chr Obstructive Asthma> stable on Advair100 & Proventil prn; notes breathing at baseline & Prn Klonopin helps dyspnea...    HBP> on Norvasc10, HCTZ25; BP=136/60, tol meds well; denies CP, palpit, ch in SOB, edema, etc...    CHOL> on diet alone, refuses meds, FLP 3/14 shows TChol 177, TG 50, HDL 60, LDL 107    DM> on diet alone, wt stable ~174#, BS=97, last A1c (2/13) was 6.3 & she knows to restrict carbs etc...    GI- Reflux, Divertics, constip> on Protonix40, Miralax, Senakot-S; continue same meds..   Marland KitchenDJD/ LBP> on Celebrex & osteobiflex prn; had right THR 2011; known sp stenosis w/ prev ESI... We reviewed prob list, meds, xrays and labs> see below for updates >>   CXR 2/14 showed normal heart size, clear lungs w/ sl peribronch thickening, DJD in spine, NAD...  LABS 3/14:  FLP- at goals on diet x LDL=107;  Chems- ok x TCO2=36;  CBC- wnl;  TSH=1.93;  UA- clear...  ~  August 17, 2013:  28moROV & [name] continues to do well at 89 no new complaints or concerns...    COPD stable on Advair100 & only using it prn she says; no recent resp exac & denies cough, sput, hemoptysis, SOB, CP, etc...    BP controlled on Amlod5 & Hct25-1/2 daily; BP= 130/70 & she denies CP, palpit, dizzy, SOB, edema, etc...     Chol & BS controlled on diet alone; weight is down 4# to 169# today...    GI is stable on Protonix40, antiireflux regimen, & Miralax...     DJD/ LBP treated w/ Vits, Osteobiflex, Celebrex200 prn & Neurontin100 prn- this is the way she likes it! We reviewed prob list, meds, xrays and labs> see below for updates >> ok 2014 Flu vaccine today...  ~  February 15, 2014:  6532moOV & [name] recently saw TP w/ incr SOB/DOE walking & Qhs, swelling in legs, etc; CXR & Labs were ok; she changed her Hct to Lasix & improved... We reviewed the following medical problems during today's office visit >>     Chr Obstructive Asthma> stable on Advair100 (but only using it prn) & Proventil prn; notes breathing back to baseline & Prn Klonopin helps dyspnea...    HBP> on ASA81, Norvasc5, Lasix20- 1-2/d; BP=136/70, tol meds well; denies CP, palpit, ch in SOB, but notes persist edema in legs; Rec to ch Amlod5 to Losar50, low sodium, elev legs, Lasix40...    CHOL> on diet alone, refuses meds, FLP 3/14 shows TChol 177, TG 50, HDL 60, LDL 107    DM> on diet alone, wt up slightly to  ~179#, BS=95, last A1c (2/13) was 6.3 & she knows to restrict carbs etc...    GI- Reflux, Divertics, constip> on Protonix40, Miralax, Senakot-S; continue same meds.. [name]  DJD/ LBP> on Celebrex & osteobiflex prn; had right THR 2011; known sp stenosis w/ prev ESI... We reviewed prob list, meds, xrays and labs> see below for updates >>   CXR 3/15 showed norm heart size, clear lungs, elev of right hemidiaph, NAD...  LABS 3/15:  Chems- wnl;  CBC- wnl;  BNP=26...  ~  Apr 14, 2014:  32m60moV & add-on appt requested for "indigestion"> difficult hx but pt appears to be c/o swallowing difficulty, dysphagia, heartburn, & indigestion according to the daugh; she's had gas, belching, hoarseness, SOB, and early satiety; also notes constipation & took mag citrate for relief;  Epic review indicates hx indigestion & reflux symptoms- treated by DrPerry w/ PPI, she had EGD & Colon 2002;  Notes food sticking in mid-esoph w/ pain, nausea despite her Prevacid30 qam...  We decided to increase the PPI- Prevacid30Bid and refer to DrPerry for EGD and dilatation....    She is also c/o incr ankle edema; on Lasix20, no salt, etc;  We discussed increase diuretic to 39m66m and re-double efforts at sodium restriction, elevation, support hose,  etc...     BP remains stable ooff Amlod, on Losar50, Lasix40;  BP= 138/80 today & she denies angina pain, CHF, etc...  We reviewed the following medical problems during today's office visit >>           Problem List:       DYSPNEA (ICD-786.05) - long hx of chronic obstructive asthma treated w/ ADVAIR100Bid & PROAIR (she uses them Prn now)... she is a non-smoker w/ some reactive airways disease in the past & retired from [employer] years in 1991... she denies cough, sputum, hemoptysis, worsening dyspnea, wheezing,  chest pains, snoring, daytime hypersomnolence, etc...  ~  baseline CXR w/o acute changes...  ~  PFT's 5/02 w/ FVC 2.07 (68%), FEV1=1.36 (57%), and FEV1/FVC ratio=66%, mid-flows 42%... ~  CT Angio 7/10 was neg- x biapical pleuroparenchymal scarring... ~  CXR 10/11 showed sl elev right hemidaiph, mild DJD sp, osteopenia, NAD.[name]. ~  CXR 6/12 showed mild apical scarring, clear & NAD, DJD sp w/ osteophytes... ~  Intermittent dyspnea more related to anxiety & treated w/ KLONOPIN 0.56m 1/2 to 1 tab Bid==> improved. ~  CXR 4/13 showed normal heart size, clear lungs, DJD in TSpine... ~  CXR 2/14 showed normal heart size, clear lungs w/ sl peribronch thickening, DJD in spine, NAD..[name] ~  CXR 3/15 showed norm heart size, clear lungs, elev of right hemidiaph, NAD...  HYPERTENSION (ICD-401.9) - controlled on NORVASC 576mdaily & HCTZ 2524mb daily...  ~  2/13:  BP 144/70 today> tol rx well & denies HA, visual changes, CP, palipit, dizziness, syncope, edema, etc... ~  4/13:  BP= 148/80 & she denies CP, palpit, edema; dyspnea improved w/ Klonopin. ~  6/13:  BP= 132/68 & as noted she has mult somatic complaints... ~  3/14:  on Norvasc10, HCTZ25; BP=136/60, tol meds well; denies CP, palpit, ch in SOB, edema, etc  ~  9/14:  BP controlled on Amlod5 & Hct25-1/2 daily; BP= 130/70 & she denies CP, palpit, dizzy, SOB, edema, etc. ~  3/15: on ASA81, Norvasc5, Lasix20- 1-2/d; BP=136/70, tol meds well;  denies CP, palpit, ch in SOB, but notes persist edema in legs; Rec to ch Amlod5 to Losar50, low sodium, elev legs, Lasix40. ~  5/15: on Losar50, Lasix40;  BP= 138/60 & she denies angina pain, palpit, ch in SOB, etc...  RIGHT BUNDLE BRANCH BLOCK (ICD-426.4) - on ASA 21m51m.. baseline EKG w/ RBBB and 2DEcho 5/02 showed mild asymmetric LVH w/ incr EF...  VENOUS INSUFFIC & EDEMA >>  ~  3/15: she was switched off Amlod & onto Losar50, Lasix20=>40, plus no salt, elevation, support hose, etc...   HYPERLIPIDEMIA (ICD-272.4) - on diet alone... she forgets to come to visits FASTING for this blood work ~  FLP [place]7 showed TChol 205, TG 71, HDL 49, LDL 130... ~  FLP 6/12 on diet alone showed TChol 218, TG 50, HDL 66, LDL 129 ~  FLP 2/13 on diet alone showed TChol 171, TG 43, HDL 66, LDL 97 ~  FLP 3/14 on diet alone showed TChol 177, TG 50, HDL 60, LDL 107 ~  She needs to ret FASTING for f/u FLP...  DIABETES MELLITUS, BORDERLINE (ICD-790.29) - on diet alone w/ prev BS's in the 100-160 range... ~  labs in 2008-9 showed BS= 101 to 108 ~  labs 1/10 showed BS= 106, A1c= 5.9 ~  Labs 6/12 showed BS= 93, A1c= 6.6... rMarland KitchenMarland Kitchen diet, exercise... ~  Labs 2/13 showed BS= 92, A1c= 6.3 ~  3/14: on diet alone, wt stable ~174#, BS=97, last A1c (2/13) was 6.3 & she knows to restrict carbs etc. ~  Labs 3/15 showed BS= 95  INDIGESTION/ REFLUX SYMPTOMS >> see 10/12 note & PROTONIX 40mg56mtarted, further eval if symptoms persist... ~  4/13:  She notes some reflux symptoms and excess gas w/ belching; rec to take the Protonix daily & Simethacone vs Tums which she says helps her gas. ~  5/13:  She saw GI DrPerry w/ rec to take Prilosec for her indigestion... ~  5/15:  She presented w/ worsening indigestion, dysphagia, reflux & Prev30 was incr  to Bid w/ GI f/u suggested for EGD...  DIVERTICULOSIS OF COLON (ICD-562.10) - she takes SENAKOT-S, MIRALAX, Peppermint Tea, & sauerkraut Prn...last colonoscopy 9/02 by DrPerry was  WNL...  PYELONEPHRITIS (ICD-590.80) - SEE 1/09 Hospitalization (reviewed)... ~  She saw DrMacDiarmid for her recurrent UTIs, chronic cystitis, urge & stress incont, nocturia; she is INTOL to [place]...  Hx of BREAST CYST (ICD-610.0)  DEGENERATIVE JOINT DISEASE (ICD-715.90) - s/p right hip hemiarthroplasty 11/09 by DrAplington w/ wound complic... then dx w/ loosening of the femoral shaft & had conversion to right THR by DrAlusio 10/11 & much improved... she uses CELEBREX 23m Prn (seldom takes this).  LOW BACK PAIN SYNDROME (ICD-724.2) & SPINAL STENOSIS (ICD-724.00) - severe LBP & spinal stenosis w/ evals by DrRamos & DrNudelman... s/p shots, considering poss surgery vs alternative therapies... she takes Celebrex, Osteobiflex, MVI, Vit D... ~  8/10: eval by DrAplington- diff leg lengths, lift placed in right shoe, then trial Lyrica552m.. ~  12/11:  improved after hip revision surg (to THR) 10/11 w/ better ambulaton... ~  5/13:  DrRamos gave her another ESI for her leg pain related to sp stenosis... ~  3/14:  C/o neuropathic discomfort in legs- eval by Ramos & offered shots in her back; try Lyrica50 in the interim...  Hx of ANEMIA (ICD-285.9) - eval by GI in 2002 showed normal EGD and Colon... prob iron malabsorption problem Rx'd w/ Fe infusion... ~  labs 1/10 showed Hg= 14.6, MCV= 89, Fe= 94 ~  labs 12/11 showed Hg= 12.8, MCV= 90, Fe= 33... try Fe supplement + VitC... ~  Labs 6/12 showed Hg= 15.0 ~  Labs 2/13 showed Hg= 14.6 ~  Labs 2/14 showed Hg= 14.8 ~  Labs 3/15 showed Hg= 14.9  DERM:  rash Rx'd by dermatology- OLUX-E foam= clobetasol Foam 0.05%...   Past Surgical History  Procedure Laterality Date  . Cataract extraction    . Right hip hemiarthroplasty    . Conversion to right thr      Outpatient Encounter Prescriptions as of 04/14/2014  Medication Sig  . albuterol (PROAIR HFA) 108 (90 BASE) MCG/ACT inhaler Inhale 2 puffs into the lungs 4 (four) times daily as needed.   . Marland Kitchenspirin 81 MG tablet Take 81 mg by mouth daily.    . CELEBREX 200 MG capsule TAKE 1 CAPSULE ONCE DAILY AS NEEDED FOR PAIN.  . Marland Kitchenholecalciferol (VITAMIN D3) 400 UNITS CAPS Take 1 capsule by mouth daily.    . Diphenhyd-Hydrocort-Nystatin (FIRST-DUKES MOUTHWASH) SUSP 1 tsp gargle and swallow four times daily as needed  . Ferrous Sulfate Dried (FEOSOL) 200 (65 FE) MG TABS Take 1 tablet by mouth daily. With Vit C 50083mith each dose  . Fluticasone-Salmeterol (ADVAIR DISKUS) 100-50 MCG/DOSE AEPB Inhale 1 puff into the lungs every 12 (twelve) hours as needed.  . lMarland Kitchensartan (COZAAR) 50 MG tablet Take 1 tablet (50 mg total) by mouth daily.  . Menthol, Topical Analgesic, (BIOFREEZE) 4 % GEL As needed   . Misc Natural Products (OSTEO BI-FLEX ADV DOUBLE ST PO) Take 1 tablet by mouth daily.   . Multiple Vitamins-Minerals (CENTRUM SILVER PO) Take 1 capsule by mouth daily.    . pantoprazole (PROTONIX) 40 MG tablet TAKE 1 TABLET 30 MINUTES BEFORE breakfast and dinner  . [DISCONTINUED] albuterol (PROAIR HFA) 108 (90 BASE) MCG/ACT inhaler Inhale 2 puffs into the lungs 4 (four) times daily as needed.    . [DISCONTINUED] Fluticasone-Salmeterol (ADVAIR DISKUS) 100-50 MCG/DOSE AEPB Inhale 1 puff into the lungs every 12 (  twelve) hours as needed.   . [DISCONTINUED] furosemide (LASIX) 20 MG tablet Take 2 tablets by mouth every morning.  . [DISCONTINUED] pantoprazole (PROTONIX) 40 MG tablet TAKE 1 TABLET 30 MINUTES BEFORE 1ST MEAL OF THE DAY.  . furosemide (LASIX) 40 MG tablet Take 1 tablet (40 mg total) by mouth daily.  [name] gabapentin (NEURONTIN) 100 MG capsule Start with 1 tablet by mouth at bedtime, can slowly increase to 2-3 tablets by mouth at bedtime.  . polyethylene glycol (MIRALAX / GLYCOLAX) packet Take 17 g by mouth daily as needed.   . [DISCONTINUED] sulfamethoxazole-trimethoprim (SEPTRA DS) 800-160 MG per tablet Take 1 tablet by mouth 2 (two) times daily.    Allergies  Allergen Reactions  . Azithromycin      Trouble breathing  . Ciprofloxacin     REACTION: hallucinations  . Latex     rash  . Levaquin [Levofloxacin In D5w]     Causes pt unable to sleep, legs are tingling, severe indigestion.      Current Medications, Allergies, Past Medical History, Past Surgical History, Family History, and Social History were reviewed in [employer] record.    Review of Systems         See HPI - all other systems neg except as noted... The patient complains of decreased hearing, dyspnea on exertion, muscle weakness, and difficulty walking.  The patient denies anorexia, fever, weight loss, weight gain, vision loss, hoarseness, chest pain, syncope, peripheral edema, prolonged cough, headaches, hemoptysis, abdominal pain, melena, hematochezia, severe indigestion/heartburn, hematuria, incontinence, suspicious skin lesions, transient blindness, depression, unusual weight change, abnormal bleeding, enlarged lymph nodes, and angioedema.     Objective:   Physical Exam     WD, WN, Chr ill appearing 80 y/o WF in NAD... GENERAL:  Alert & oriented; pleasant & cooperative... HEENT:  [place]/AT, EOM-full, EACs-clear, TMs-wnl, NOSE-clear, THROAT-clear & wnl. NECK:  Supple w/ fairROM; no JVD; normal carotid impulses w/o bruits; no thyromegaly or nodules palpated; no lymphadenopathy. CHEST:  Clear to P & A; without wheezes/ rales/ or rhonchi heard... HEART:  Regular Rhythm; without murmurs/ rubs/ or gallops detected... ABDOMEN:  Soft & nontender; normal bowel sounds; no organomegaly or masses palpated... EXT:  mod arthritic changes, walks w/ cane, +venous insuffic & tr edema., scattered varicose veins... NEURO:  CN's intact; motor testing normal; no focal deficits... DERM:   mild intertrig rash under breast, & onychomycosis of toenails...  RADIOLOGY DATA:  Reviewed in the EPIC EMR & discussed w/ the patient...  LABORATORY DATA:  Reviewed in the EPIC EMR & discussed w/ the patient...   Assessment &  Plan:    DYSPNEA>  Hx asthma, stable on Advair, Proair; hx anxiety component on Klonopin> symptoms better w/ regular use.  HBP>  Controlled on Losartan, Lasix, +diet, etc....  RBBB>  Aware & denies CP, palpit, ch in [place], etc...  CHOL>  On diet alone & FLP looks reasonable;  We reviewed low chol, low fat diet...  DM>  BS= 95 & A1c is 6.3 when last checked;  on diet alone & we reviewed low carb no sweets etc...  GI> Indigestion, Divertics> she notes most bowel symptoms resolved off spicey food & prev decr PPI just prn; now c/o incr dysphagia & referred to GI for f/u EGD, etc...  UTI>  Klebsiella UTI resolved after Septra Rx... She has been eval by DrMacDiarmid.  DJD, LBP, Spinal Stenosis>  Prev evals by Ortho, DrRamos, DrNudelman etc; improved after THR w/ better ambulation; c/o neuropathic  discomfort in legs- she will f/u w/ Ramos for shots, try Lyrica50 in the interim...  Anxiety>  The Klonopin Bid helps...   Patient's Medications  New Prescriptions   FUROSEMIDE (LASIX) 40 MG TABLET    Take 1 tablet (40 mg total) by mouth daily.  Previous Medications   ASPIRIN 81 MG TABLET    Take 81 mg by mouth daily.     CELEBREX 200 MG CAPSULE    TAKE 1 CAPSULE ONCE DAILY AS NEEDED FOR PAIN.   CHOLECALCIFEROL (VITAMIN D3) 400 UNITS CAPS    Take 1 capsule by mouth daily.     DIPHENHYD-HYDROCORT-NYSTATIN (FIRST-DUKES MOUTHWASH) SUSP    1 tsp gargle and swallow four times daily as needed   FERROUS SULFATE DRIED (FEOSOL) 200 (65 FE) MG TABS    Take 1 tablet by mouth daily. With Vit C 532m with each dose   GABAPENTIN (NEURONTIN) 100 MG CAPSULE    Start with 1 tablet by mouth at bedtime, can slowly increase to 2-3 tablets by mouth at bedtime.   LOSARTAN (COZAAR) 50 MG TABLET    Take 1 tablet (50 mg total) by mouth daily.   MENTHOL, TOPICAL ANALGESIC, (BIOFREEZE) 4 % GEL    As needed    MISC NATURAL PRODUCTS (OSTEO BI-FLEX ADV DOUBLE ST PO)    Take 1 tablet by mouth daily.    MULTIPLE  VITAMINS-MINERALS (CENTRUM SILVER PO)    Take 1 capsule by mouth daily.     POLYETHYLENE GLYCOL (MIRALAX / GLYCOLAX) PACKET    Take 17 g by mouth daily as needed.   Modified Medications   Modified Medication Previous Medication   ALBUTEROL (PROAIR HFA) 108 (90 BASE) MCG/ACT INHALER albuterol (PROAIR HFA) 108 (90 BASE) MCG/ACT inhaler      Inhale 2 puffs into the lungs 4 (four) times daily as needed.    Inhale 2 puffs into the lungs 4 (four) times daily as needed.     FLUTICASONE-SALMETEROL (ADVAIR DISKUS) 100-50 MCG/DOSE AEPB Fluticasone-Salmeterol (ADVAIR DISKUS) 100-50 MCG/DOSE AEPB      Inhale 1 puff into the lungs every 12 (twelve) hours as needed.    Inhale 1 puff into the lungs every 12 (twelve) hours as needed.    PANTOPRAZOLE (PROTONIX) 40 MG TABLET pantoprazole (PROTONIX) 40 MG tablet      TAKE 1 TABLET 30 MINUTES BEFORE breakfast and dinner    TAKE 1 TABLET 30 MINUTES BEFORE 1ST MEAL OF THE DAY.  Discontinued Medications   FUROSEMIDE (LASIX) 20 MG TABLET    Take 2 tablets by mouth every morning.   SULFAMETHOXAZOLE-TRIMETHOPRIM (SEPTRA DS) 800-160 MG PER TABLET    Take 1 tablet by mouth 2 (two) times daily.

## 2014-04-14 NOTE — Telephone Encounter (Signed)
Per SN---  Ok to add the pt on today at 28.  Pt is aware.

## 2014-04-14 NOTE — Telephone Encounter (Signed)
long hx of chronic obstructive asthma noted in chart; spoke with Leigh and SN agreed to see patient today at 12 noon. I have called and left message for Talbert [place] to call me back asap so I can schedule patient today. I called patient on her mobile number and was able to speak with her-she states she needed to have [place] call me back asap to confirm appt today-pt was made aware that this is the only time we have for her to see SN this week;otherwise she would have to see TP. Pt verbalized her understanding of this.

## 2014-04-14 NOTE — Patient Instructions (Signed)
Today we updated your med list in our EPIC system...    Continue your current medications the same...  We decided to increase your PROTONIX (Pantoprazole) 40mg - one tab twice daily take 30 min before breakfast & dinner...  You may use OTC MYLICON or PHAZYME as needed for GAS symptoms...  Please increase the LASIX (Furosemide) to 40mg  once daily in the AM...   We will set up an appt w/ DrPerry/ GI- for eval & probable endoscopy to check your esophagus...   Call for any questions.Marland KitchenMarland Kitchen

## 2014-04-15 ENCOUNTER — Telehealth: Payer: Self-pay | Admitting: Pulmonary Disease

## 2014-04-15 ENCOUNTER — Emergency Department (HOSPITAL_COMMUNITY)
Admission: EM | Admit: 2014-04-15 | Discharge: 2014-04-15 | Disposition: A | Discharge status: Home / Self Care | Payer: Medicare Other | Attending: Emergency Medicine | Admitting: Emergency Medicine

## 2014-04-15 ENCOUNTER — Emergency Department (HOSPITAL_COMMUNITY): Payer: Medicare Other

## 2014-04-15 ENCOUNTER — Encounter (HOSPITAL_COMMUNITY): Payer: Self-pay | Admitting: Emergency Medicine

## 2014-04-15 DIAGNOSIS — Z862 Personal history of diseases of the blood and blood-forming organs and certain disorders involving the immune mechanism: Secondary | ICD-10-CM | POA: Insufficient documentation

## 2014-04-15 DIAGNOSIS — Z91040 Latex allergy status: Secondary | ICD-10-CM | POA: Insufficient documentation

## 2014-04-15 DIAGNOSIS — Z79899 Other long term (current) drug therapy: Secondary | ICD-10-CM | POA: Insufficient documentation

## 2014-04-15 DIAGNOSIS — Z7982 Long term (current) use of aspirin: Secondary | ICD-10-CM | POA: Insufficient documentation

## 2014-04-15 DIAGNOSIS — Z8719 Personal history of other diseases of the digestive system: Secondary | ICD-10-CM | POA: Insufficient documentation

## 2014-04-15 DIAGNOSIS — Z8639 Personal history of other endocrine, nutritional and metabolic disease: Secondary | ICD-10-CM | POA: Insufficient documentation

## 2014-04-15 DIAGNOSIS — M1990 Unspecified osteoarthritis, unspecified site: Secondary | ICD-10-CM | POA: Insufficient documentation

## 2014-04-15 DIAGNOSIS — D649 Anemia, unspecified: Secondary | ICD-10-CM | POA: Insufficient documentation

## 2014-04-15 DIAGNOSIS — Z8742 Personal history of other diseases of the female genital tract: Secondary | ICD-10-CM | POA: Insufficient documentation

## 2014-04-15 DIAGNOSIS — R079 Chest pain, unspecified: Secondary | ICD-10-CM | POA: Insufficient documentation

## 2014-04-15 DIAGNOSIS — R0602 Shortness of breath: Secondary | ICD-10-CM | POA: Insufficient documentation

## 2014-04-15 DIAGNOSIS — R531 Weakness: Secondary | ICD-10-CM

## 2014-04-15 DIAGNOSIS — R609 Edema, unspecified: Secondary | ICD-10-CM | POA: Insufficient documentation

## 2014-04-15 DIAGNOSIS — R1013 Epigastric pain: Secondary | ICD-10-CM

## 2014-04-15 DIAGNOSIS — R5381 Other malaise: Secondary | ICD-10-CM | POA: Insufficient documentation

## 2014-04-15 DIAGNOSIS — I10 Essential (primary) hypertension: Secondary | ICD-10-CM | POA: Insufficient documentation

## 2014-04-15 DIAGNOSIS — R5383 Other fatigue: Principal | ICD-10-CM

## 2014-04-15 LAB — URINALYSIS, ROUTINE W REFLEX MICROSCOPIC
BILIRUBIN URINE: NEGATIVE
Glucose, UA: NEGATIVE mg/dL
Hgb urine dipstick: NEGATIVE
KETONES UR: NEGATIVE mg/dL
Leukocytes, UA: NEGATIVE
NITRITE: NEGATIVE
Protein, ur: NEGATIVE mg/dL
SPECIFIC GRAVITY, URINE: 1.009 (ref 1.005–1.030)
Urobilinogen, UA: 0.2 mg/dL (ref 0.0–1.0)
pH: 7.5 (ref 5.0–8.0)

## 2014-04-15 LAB — BASIC METABOLIC PANEL
BUN: 12 mg/dL (ref 6–23)
CALCIUM: 10.1 mg/dL (ref 8.4–10.5)
CO2: 34 mEq/L — ABNORMAL HIGH (ref 19–32)
CREATININE: 1.05 mg/dL (ref 0.50–1.10)
Chloride: 97 mEq/L (ref 96–112)
GFR calc Af Amer: 53 mL/min — ABNORMAL LOW (ref >90)
GFR, EST NON AFRICAN AMERICAN: 46 mL/min — AB (ref >90)
GLUCOSE: 120 mg/dL — AB (ref 70–99)
Potassium: 3.9 mEq/L (ref 3.7–5.3)
SODIUM: 141 meq/L (ref 137–147)

## 2014-04-15 LAB — CBC
HCT: 44.2 % (ref 36.0–46.0)
Hemoglobin: 14.9 g/dL (ref 12.0–15.0)
MCH: 30.5 pg (ref 26.0–34.0)
MCHC: 33.7 g/dL (ref 30.0–36.0)
MCV: 90.6 fL (ref 78.0–100.0)
Platelets: 146 10*3/uL — ABNORMAL LOW (ref 150–400)
RBC: 4.88 MIL/uL (ref 3.87–5.11)
RDW: 13 % (ref 11.5–15.5)
WBC: 5.7 10*3/uL (ref 4.0–10.5)

## 2014-04-15 LAB — TROPONIN I: Troponin I: <0.3 ng/mL (ref <0.30)

## 2014-04-15 LAB — PRO B NATRIURETIC PEPTIDE: PRO B NATRI PEPTIDE: 224.8 pg/mL (ref 0–450)

## 2014-04-15 MED ORDER — OMEPRAZOLE 20 MG PO CPDR: 20.0000 mg | DELAYED_RELEASE_CAPSULE | Freq: Every day | ORAL | Status: DC

## 2014-04-15 NOTE — Telephone Encounter (Signed)
SN is aware. Nothing further is needed.  

## 2014-04-15 NOTE — ED Provider Notes (Signed)
CSN: [id number]     Arrival date & time 04/15/14  1636 History   First MD Initiated Contact with Patient 04/15/14 1954     Chief Complaint  Patient presents with  . Weakness     (Consider location/radiation/quality/duration/timing/severity/associated sxs/prior Treatment) Patient is a 80 y.o. female presenting with weakness. The history is provided by the patient.  Weakness This is a recurrent problem. Associated symptoms include chest pain, abdominal pain and shortness of breath. Pertinent negatives include no headaches.   patient's had episodes of epigastric pain and shortness of breath or eating for a while now. She was seen by her primary care doctor for yesterday. He was thought to be due to her GERD. She has had an increase of her Prilosec and her Lasix. She had some increasing or shortness of breath with these episodes also. She's not been taking her Lasix twice a day as she was supposed to. No fevers. She does get occasional chest pain with it. She states the pain is worse if she eats fatty foods. She has an appointment to followup with gastroenterology. She was generally weak yesterday but feels somewhat better now. She's had at least 3 episodes of this with eating.   Past Medical History  Diagnosis Date  . Shortness of breath   . Unspecified essential hypertension   . Right bundle branch block   . Other and unspecified hyperlipidemia   . Other abnormal glucose   . Diverticulosis of colon (without mention of hemorrhage)   . Pyelonephritis, unspecified   . Solitary cyst of breast   . Osteoarthrosis, unspecified whether generalized or localized, unspecified site   . Lumbago   . Spinal stenosis, unspecified region other than cervical   . Anemia, unspecified    Past Surgical History  Procedure Laterality Date  . Cataract extraction    . Right hip hemiarthroplasty    . Conversion to right thr     Family History  Problem Relation Age of Onset  . Cancer Brother   . Cancer  Brother    History  Substance Use Topics  . Smoking status: Never Smoker   . Smokeless tobacco: Never Used  . Alcohol Use: No   OB History   Grav Para Term Preterm Abortions TAB SAB Ect Mult Living                 Review of Systems  Constitutional: Positive for appetite change and fatigue. Negative for fever and activity change.  Eyes: Negative for pain.  Respiratory: Positive for shortness of breath. Negative for chest tightness.   Cardiovascular: Positive for chest pain. Negative for leg swelling.  Gastrointestinal: Positive for abdominal pain. Negative for nausea, vomiting and diarrhea.  Genitourinary: Negative for flank pain.  Musculoskeletal: Negative for back pain and neck stiffness.  Skin: Negative for rash.  Neurological: Positive for weakness. Negative for numbness and headaches.  Psychiatric/Behavioral: Negative for behavioral problems.      Allergies  Azithromycin; Ciprofloxacin; Levofloxacin; and Latex  Home Medications   Prior to Admission medications   Medication Sig Start Date End Date Taking? Authorizing Provider  albuterol (PROVENTIL HFA;VENTOLIN HFA) 108 (90 BASE) MCG/ACT inhaler Inhale 2 puffs into the lungs every 6 (six) hours as needed for wheezing or shortness of breath.   Yes Historical Provider, MD  aspirin 81 MG tablet Take 81 mg by mouth daily.     Yes Historical Provider, MD  cholecalciferol (VITAMIN D-400) 400 UNITS TABS tablet Take 400 Units by mouth daily.  Yes Historical Provider, MD  Diphenhyd-Hydrocort-Nystatin (FIRST-DUKES MOUTHWASH) SUSP Swish and swallow 5 mLs 4 (four) times daily as needed (pain, irritation). Gargle with 5ml 08/17/13  Yes Michele McalpineScott M Nadel, MD  ferrous sulfate 325 (65 FE) MG tablet Take 325 mg by mouth daily with breakfast.   Yes Historical Provider, MD  furosemide (LASIX) 40 MG tablet Take 1 tablet (40 mg total) by mouth daily. 04/14/14  Yes Michele McalpineScott M Nadel, MD  glycerin adult (GLYCERIN ADULT) 2 G SUPP Place 1 suppository  rectally once as needed for mild constipation or moderate constipation.   Yes Historical Provider, MD  losartan (COZAAR) 50 MG tablet Take 1 tablet (50 mg total) by mouth daily. 02/15/14  Yes Michele McalpineScott M Nadel, MD  Menthol, Topical Analgesic, (BIOFREEZE) 4 % GEL Apply 1 application topically daily as needed (pain). As needed   Yes Historical Provider, MD  Misc Natural Products (OSTEO BI-FLEX ADV DOUBLE ST PO) Take 1 tablet by mouth daily.    Yes Historical Provider, MD  Multiple Vitamin (MULTIVITAMIN WITH MINERALS) TABS tablet Take 1 tablet by mouth daily.   Yes Historical Provider, MD  pantoprazole (PROTONIX) 40 MG tablet Take 40 mg by mouth 2 (two) times daily.   Yes Historical Provider, MD  polyethylene glycol (MIRALAX / GLYCOLAX) packet Take 17 g by mouth daily as needed for mild constipation.    Yes Historical Provider, MD  vitamin C (ASCORBIC ACID) 500 MG tablet Take 500 mg by mouth daily.   Yes Historical Provider, MD   BP 148/77  Pulse 64  Temp(Src) 98.4 F (36.9 C) (Oral)  Resp 19  SpO2 94% Physical Exam  Nursing note and vitals reviewed. Constitutional: She is oriented to person, place, and time. She appears well-developed and well-nourished.  HENT:  Head: Normocephalic and atraumatic.  Eyes: EOM are normal. Pupils are equal, round, and reactive to light.  Neck: Normal range of motion. Neck supple.  Cardiovascular: Normal rate, regular rhythm and normal heart sounds.   No murmur heard. Pulmonary/Chest: Effort normal and breath sounds normal. No respiratory distress. She has no wheezes. She has no rales.  Mildly harsh breath sounds worse at bases without wheezes.  Abdominal: Soft. Bowel sounds are normal. She exhibits no distension. There is tenderness. There is no rebound and no guarding.  Mild epigastric and upper quadrant tenderness without rebound or guarding. No hernias.  Musculoskeletal: Normal range of motion. She exhibits edema.  Mild bilateral LE pitting edema.    Neurological: She is alert and oriented to person, place, and time. No cranial nerve deficit.  Skin: Skin is warm and dry.  Psychiatric: She has a normal mood and affect. Her speech is normal.    ED Course  Procedures (including critical care time) Labs Review Labs Reviewed  CBC - Abnormal; Notable for the following:    Platelets 146 (*)    All other components within normal limits  BASIC METABOLIC PANEL - Abnormal; Notable for the following:    CO2 34 (*)    Glucose, Bld 120 (*)    GFR calc non Af Amer 46 (*)    GFR calc Af Amer 53 (*)    All other components within normal limits  TROPONIN I  PRO B NATRIURETIC PEPTIDE  URINALYSIS, ROUTINE W REFLEX MICROSCOPIC    Imaging Review Dg Chest 2 View  04/15/2014   CLINICAL DATA:  Short of breath  EXAM: CHEST  2 VIEW  COMPARISON:  Prior chest x-ray 01/22/2014  FINDINGS: Stable cardiac and mediastinal  contours. Atherosclerotic calcifications noted in the transverse aorta. Stable appearance of the lung parenchyma with central bronchitic changes and diffuse mild interstitial prominence suggesting a background of chronic disease. No new airspace consolidation, pleural effusion, pneumothorax or evidence of edema. Mild pulmonary hyperexpansion. No acute osseous abnormality. Multilevel mild degenerative spurring in the thoracic spine.  IMPRESSION: Stable chest x-ray without evidence of active cardiopulmonary disease.  Chronic changes of COPD and aortic atherosclerosis.   Electronically Signed   By: Malachy Moan M.D.   On: 04/15/2014 20:35   US Abdomen Complete  04/15/2014   CLINICAL DATA:  Epigastric pain  EXAM: ULTRASOUND ABDOMEN COMPLETE  COMPARISON:  11/25/2007  FINDINGS: Gallbladder:  Normally distended without stones or wall thickening.  No pericholecystic fluid or sonographic Murphy sign.  Common bile duct:  Diameter: 4 mm diameter common normal  Liver:  Normal appearance  IVC:  Normal appearance  Pancreas:  Head, body and proximal tail  grossly normal appearance, the distal tail obscured by bowel gas.  Spleen:  Normal appearance, 6.6 cm diameter  Right Kidney:  Length: 10.6 cm. Cortical thinning. Increased cortical echogenicity. No gross mass or hydronephrosis.  Left Kidney:  Length: 11.3 cm. Limited visualization secondary to bowel gas. Cortex appears echogenic and thinned. No gross mass or hydronephrosis seen on limited assessment.  Abdominal aorta:  Normal caliber.  Atherosclerotic changes.  Other findings:  No free-fluid  IMPRESSION: Medical renal disease changes of both kidneys.  Incomplete pancreatic visualization.  No definite acute abnormalities.   Electronically Signed   By: Ulyses Southward M.D.   On: 04/15/2014 22:36     EKG Interpretation   Date/Time:  Thursday Apr 15 2014 20:46:59 EDT Ventricular Rate:  68 PR Interval:    QRS Duration: 124 QT Interval:  470 QTC Calculation: 500 R Axis:   -51 Text Interpretation:  Sinus rhythm Premature atrial complexes Ventricular  premature complex RBBB and LAFB Baseline wander in lead(s) II III aVF  Confirmed by Emanuelle Bastos  MD, Harrold Donath [phone]) on 04/15/2014 9:17:12 PM      MDM   Final diagnoses:  Weakness  Epigastric pain    Patient with generalized weakness and epigastric pain. History of same. Seen yesterday for her. Lab work reassuring. Does not appear to be volume overloaded. Ultrasound done due to postprandial pain. Negative. Follow with PCP and GI as planned    Juliet Rude. Rubin Payor, MD 04/16/14 0030

## 2014-04-15 NOTE — ED Notes (Signed)
Per family/pt-GERD symptoms since last week-saw PCP today, has appt with GI next week-increased weakness-some relief with prilosec

## 2014-04-15 NOTE — Telephone Encounter (Signed)
Will route message to Marliss Czar so that she may let SN know about this.

## 2014-04-15 NOTE — Discharge Instructions (Signed)
Abdominal Pain, Adult °Many things can cause abdominal pain. Usually, abdominal pain is not caused by a disease and will improve without treatment. It can often be observed and treated at home. Your health care provider will do a physical exam and possibly order blood tests and X-rays to help determine the seriousness of your pain. However, in many cases, more time must pass before a clear cause of the pain can be found. Before that point, your health care provider may not know if you need more testing or further treatment. °HOME CARE INSTRUCTIONS  °Monitor your abdominal pain for any changes. The following actions may help to alleviate any discomfort you are experiencing: °· Only take over-the-counter or prescription medicines as directed by your health care provider. °· Do not take laxatives unless directed to do so by your health care provider. °· Try a clear liquid diet (broth, tea, or water) as directed by your health care provider. Slowly move to a bland diet as tolerated. °SEEK MEDICAL CARE IF: °· You have unexplained abdominal pain. °· You have abdominal pain associated with nausea or diarrhea. °· You have pain when you urinate or have a bowel movement. °· You experience abdominal pain that wakes you in the night. °· You have abdominal pain that is worsened or improved by eating food. °· You have abdominal pain that is worsened with eating fatty foods. °SEEK IMMEDIATE MEDICAL CARE IF:  °· Your pain does not go away within 2 hours. °· You have a fever. °· You keep throwing up (vomiting). °· Your pain is felt only in portions of the abdomen, such as the right side or the left lower portion of the abdomen. °· You pass bloody or black tarry stools. °MAKE SURE YOU: °· Understand these instructions.   °· Will watch your condition.   °· Will get help right away if you are not doing well or get worse.   °Document Released: 08/15/2005 Document Revised: 08/26/2013 Document Reviewed: 07/15/2013 °[employer]® Patient  Information ©2014 [employer]. ° °

## 2014-04-26 ENCOUNTER — Encounter: Payer: Self-pay | Admitting: Internal Medicine

## 2014-04-26 ENCOUNTER — Ambulatory Visit (INDEPENDENT_AMBULATORY_CARE_PROVIDER_SITE_OTHER): Payer: Medicare Other | Admitting: Internal Medicine

## 2014-04-26 VITALS — BP 140/82 | HR 78 | Ht 68.0 in | Wt 167.0 lb

## 2014-04-26 DIAGNOSIS — R0609 Other forms of dyspnea: Secondary | ICD-10-CM

## 2014-04-26 DIAGNOSIS — R0600 Dyspnea, unspecified: Secondary | ICD-10-CM

## 2014-04-26 DIAGNOSIS — R0989 Other specified symptoms and signs involving the circulatory and respiratory systems: Secondary | ICD-10-CM

## 2014-04-26 DIAGNOSIS — R1013 Epigastric pain: Secondary | ICD-10-CM

## 2014-04-26 DIAGNOSIS — K3189 Other diseases of stomach and duodenum: Secondary | ICD-10-CM

## 2014-04-26 DIAGNOSIS — R109 Unspecified abdominal pain: Secondary | ICD-10-CM

## 2014-04-26 DIAGNOSIS — K219 Gastro-esophageal reflux disease without esophagitis: Secondary | ICD-10-CM

## 2014-04-26 NOTE — Patient Instructions (Addendum)
You have been scheduled for an upper GI series at [facility], 1st floor, Radiology Department on Thursday, 04/29/2014 at 9:30am.  Please arrive at 9:15am and do not have anything to eat or drink after 3:30am (6 hours prior to the test).  If you need to reschedule this appointment, please call [phone] to reschedule.

## 2014-04-26 NOTE — Progress Notes (Signed)
HISTORY OF PRESENT ILLNESS:  [name] is a 80 y.o. female with past medical history as listed below. She is sent today regarding "reflux". She is accompanied by her daughter. As best I can tell, she has had chronic problems with "trouble breathing". At least 2-1/2 years. Problems with belching and burping. Decreased appetite and weight loss. Recently had PPI increased from once daily to twice daily. States she has been feeling better the past few days. Describes panic attacks at night. Recent evaluation in the emergency room including abdominal ultrasound was negative. Remote EGD 2002 was normal as was colonoscopy at that time. NO dysphagia. Not clear to me that meals have any effect. No classic reflux symptoms, more dyspeptic complaints.  REVIEW OF SYSTEMS:  All non-GI ROS negative except for shortness of breath, sleeping problems, panic attacks  Past Medical History  Diagnosis Date  . Shortness of breath   . Unspecified essential hypertension   . Right bundle branch block   . Other and unspecified hyperlipidemia   . Other abnormal glucose   . Diverticulosis of colon (without mention of hemorrhage)   . Pyelonephritis, unspecified   . Solitary cyst of breast   . Osteoarthrosis, unspecified whether generalized or localized, unspecified site   . Lumbago   . Spinal stenosis, unspecified region other than cervical   . Anemia, unspecified   . GERD (gastroesophageal reflux disease)   . Pyelonephritis   . DJD (degenerative joint disease)   . UTI (lower urinary tract infection)     Past Surgical History  Procedure Laterality Date  . Cataract extraction    . Right hip hemiarthroplasty      total  . Conversion to right thr      Social History [name]  reports that she has never smoked. She has never used smokeless tobacco. She reports that she does not drink alcohol or use illicit drugs.  family history includes Cancer in her brother and brother.  Allergies   Allergen Reactions  . Azithromycin Shortness Of Breath    Trouble breathing  . Ciprofloxacin Other (See Comments)    REACTION: hallucinations  . Levofloxacin Other (See Comments)    Insomnia, indigestion, tingling sensation in legs  . Latex Rash    rash       PHYSICAL EXAMINATION: Vital signs: BP 140/82  Pulse 78  Ht 5\' 8"  (1.727 m)  Wt 167 lb (75.751 kg)  BMI 25.40 kg/m2 General: Well-developed, well-nourished, no acute distress HEENT: Sclerae are anicteric, conjunctiva pink. Oral mucosa intact Lungs: Clear Heart: Regular Abdomen: soft, nontender, nondistended, no obvious ascites, no peritoneal signs, normal bowel sounds. No organomegaly. Extremities: No edema Psychiatric: alert and oriented x3. Cooperative   ASSESSMENT:  #1. Episodic problems with dyspnea. Etiology unclear. Not obviously reflux to me, but reports feeling better on high-dose PPI the past few days. #2. Normal upper endoscopy 2002 #3 normal colonoscopy 2002   PLAN:  #1. Continue PPI as this might be helping #2. Upper GI series to evaluate dyspeptic complaints and assess for gross abnormalities in this 80 year old.

## 2014-04-29 ENCOUNTER — Ambulatory Visit (HOSPITAL_COMMUNITY)
Admission: RE | Admit: 2014-04-29 | Discharge: 2014-04-29 | Disposition: A | Discharge status: Home / Self Care | Payer: Medicare Other | Source: Ambulatory Visit | Attending: Internal Medicine | Admitting: Internal Medicine

## 2014-04-29 DIAGNOSIS — K224 Dyskinesia of esophagus: Secondary | ICD-10-CM | POA: Insufficient documentation

## 2014-04-29 DIAGNOSIS — K219 Gastro-esophageal reflux disease without esophagitis: Secondary | ICD-10-CM | POA: Insufficient documentation

## 2014-04-29 DIAGNOSIS — R109 Unspecified abdominal pain: Secondary | ICD-10-CM

## 2014-06-04 ENCOUNTER — Telehealth: Payer: Self-pay | Admitting: Pulmonary Disease

## 2014-06-04 MED ORDER — CLONAZEPAM 0.5 MG PO TABS: 0.5000 mg | ORAL_TABLET | Freq: Every evening | ORAL | Status: DC

## 2014-06-04 NOTE — Telephone Encounter (Signed)
Per SN---   On lasix 40 mg , cozaar 50 and will need to cont the same.   Will need to take the klonopin 0.5 mg   #30   1 po daily in the evening and ROV with SN in 2 weeks.  thanks

## 2014-06-04 NOTE — Telephone Encounter (Signed)
Called and spoke to pt. Pt stated when she lays down she feels her HR increases and a flutter in her chest and has to sit up x 2 days. Pt has been sleeping the recliner d/t not being able to lay down without her HR racing. Pt states when she lays down her "heart starts racing and then feel it in my head". Pt denies SOB, swelling, CP. Pt states when she has the fluttering in her chest when lying down she has to get up and go outside and then she feels better.    SN please advise.  Allergies  Allergen Reactions  . Azithromycin Shortness Of Breath    Trouble breathing  . Ciprofloxacin Other (See Comments)    REACTION: hallucinations  . Levofloxacin Other (See Comments)    Insomnia, indigestion, tingling sensation in legs  . Latex Rash    rash     Current Outpatient Prescriptions on File Prior to Visit  Medication Sig Dispense Refill  . albuterol (PROVENTIL HFA;VENTOLIN HFA) 108 (90 BASE) MCG/ACT inhaler Inhale 2 puffs into the lungs every 6 (six) hours as needed for wheezing or shortness of breath.      [name]. aspirin 81 MG tablet Take 81 mg by mouth daily.        . cholecalciferol (VITAMIN D-400) 400 UNITS TABS tablet Take 400 Units by mouth daily.      . Diphenhyd-Hydrocort-Nystatin (FIRST-DUKES MOUTHWASH) SUSP Swish and swallow 5 mLs 4 (four) times daily as needed (pain, irritation). Gargle with 5ml      . ferrous sulfate 325 (65 FE) MG tablet Take 325 mg by mouth daily with breakfast.      . furosemide (LASIX) 40 MG tablet Take 1 tablet (40 mg total) by mouth daily.  30 tablet  6  . glycerin adult (GLYCERIN ADULT) 2 G SUPP Place 1 suppository rectally once as needed for mild constipation or moderate constipation.      [name]. losartan (COZAAR) 50 MG tablet Take 1 tablet (50 mg total) by mouth daily.  30 tablet  6  . Menthol, Topical Analgesic, (BIOFREEZE) 4 % GEL Apply 1 application topically daily as needed (pain). As needed      . Misc Natural Products (OSTEO BI-FLEX ADV DOUBLE ST PO) Take 1  tablet by mouth daily.       . Multiple Vitamin (MULTIVITAMIN WITH MINERALS) TABS tablet Take 1 tablet by mouth daily.      . Multiple Vitamins-Minerals (CENTRUM SILVER PO) Take by mouth. Every day      . pantoprazole (PROTONIX) 40 MG tablet Take 40 mg by mouth 2 (two) times daily.      . polyethylene glycol (MIRALAX / GLYCOLAX) packet Take 17 g by mouth daily as needed for mild constipation.        No current facility-administered medications on file prior to visit.

## 2014-06-04 NOTE — Telephone Encounter (Signed)
Spoke with [name] and notified of recs per SN  She verbalized understanding  Rx was called to pharm  Nothing further needed

## 2014-06-07 ENCOUNTER — Telehealth: Payer: Self-pay | Admitting: Pulmonary Disease

## 2014-06-07 NOTE — Telephone Encounter (Signed)
Per SN---   Ok for the pt to try the melatonin.    i called and spoke with shirley and she stated that the pt has been having some confusion and ringing in her ears and they feel that this is from the lasix.  Can they stop this for a week or so to see if this helps.  SN please advise.

## 2014-06-07 NOTE — Telephone Encounter (Signed)
Called and spoke with pts daughter and she stated that after they picked up the med given at her last OV--pt refused to take the klonopin.  [name] stated that this medication has so many side effects and the pt was afraid to take it.    They are asking if melatonin would be ok for her to try and see if this helps.  SN please advise.  Thanks  Allergies  Allergen Reactions  . Azithromycin Shortness Of Breath    Trouble breathing  . Ciprofloxacin Other (See Comments)    REACTION: hallucinations  . Levofloxacin Other (See Comments)    Insomnia, indigestion, tingling sensation in legs  . Latex Rash    rash    Current Outpatient Prescriptions on File Prior to Visit  Medication Sig Dispense Refill  . albuterol (PROVENTIL HFA;VENTOLIN HFA) 108 (90 BASE) MCG/ACT inhaler Inhale 2 puffs into the lungs every 6 (six) hours as needed for wheezing or shortness of breath.      [name]. aspirin 81 MG tablet Take 81 mg by mouth daily.        . cholecalciferol (VITAMIN D-400) 400 UNITS TABS tablet Take 400 Units by mouth daily.      . clonazePAM (KLONOPIN) 0.5 MG tablet Take 1 tablet (0.5 mg total) by mouth every evening.  30 tablet  0  . Diphenhyd-Hydrocort-Nystatin (FIRST-DUKES MOUTHWASH) SUSP Swish and swallow 5 mLs 4 (four) times daily as needed (pain, irritation). Gargle with 5ml      . ferrous sulfate 325 (65 FE) MG tablet Take 325 mg by mouth daily with breakfast.      . furosemide (LASIX) 40 MG tablet Take 1 tablet (40 mg total) by mouth daily.  30 tablet  6  . glycerin adult (GLYCERIN ADULT) 2 G SUPP Place 1 suppository rectally once as needed for mild constipation or moderate constipation.      [name]. losartan (COZAAR) 50 MG tablet Take 1 tablet (50 mg total) by mouth daily.  30 tablet  6  . Menthol, Topical Analgesic, (BIOFREEZE) 4 % GEL Apply 1 application topically daily as needed (pain). As needed      . Misc Natural Products (OSTEO BI-FLEX ADV DOUBLE ST PO) Take 1 tablet by mouth daily.       .  Multiple Vitamin (MULTIVITAMIN WITH MINERALS) TABS tablet Take 1 tablet by mouth daily.      . Multiple Vitamins-Minerals (CENTRUM SILVER PO) Take by mouth. Every day      . pantoprazole (PROTONIX) 40 MG tablet Take 40 mg by mouth 2 (two) times daily.      . polyethylene glycol (MIRALAX / GLYCOLAX) packet Take 17 g by mouth daily as needed for mild constipation.        No current facility-administered medications on file prior to visit.

## 2014-06-08 NOTE — Telephone Encounter (Signed)
Per SN: Okay to refer to neuro for consult if they are ready  thanks

## 2014-06-08 NOTE — Telephone Encounter (Signed)
Spoke with [name]-pt daughter Gwendolyn Downsdvised of recs as below per Dr Kriste BasqueNadel. Advised daughter to contact our office if increased swelling is noticed once stopping Lasix. Expressed understanding.  Daughter wants Dr Kriste BasqueNadel know that the patient was complaining of "ringing in head" and restlessness/confusion--was unable to sleep when laying [name] to rest. Took a melatonin which aided in this States that the patient was able to sleep last night after taking the melatonin Daughter states that the patient was very repetitious last night, asking the questions in short period of time.  Daughter states that she may need a Neuro eval. [name] is going to talk to her mother (patient) and discuss this option with her and will contact our office back if they decide to move forward.  Will send to SN as FYI Nothing further needed.

## 2014-06-08 NOTE — Telephone Encounter (Signed)
I called made shirley aware. Nothing further needed

## 2014-06-08 NOTE — Telephone Encounter (Signed)
Per SN:  Ok to stop Lasix and see but Lasix does not usually cause confusion.  Does she need neuro eval?

## 2014-06-15 ENCOUNTER — Encounter: Payer: Self-pay | Admitting: Pulmonary Disease

## 2014-06-15 ENCOUNTER — Ambulatory Visit (INDEPENDENT_AMBULATORY_CARE_PROVIDER_SITE_OTHER): Payer: Medicare Other | Admitting: Pulmonary Disease

## 2014-06-15 VITALS — BP 138/78 | HR 66 | Temp 98.7°F | Ht 68.0 in | Wt 167.8 lb

## 2014-06-15 DIAGNOSIS — M1990 Unspecified osteoarthritis, unspecified site: Secondary | ICD-10-CM

## 2014-06-15 DIAGNOSIS — I4510 Unspecified right bundle-branch block: Secondary | ICD-10-CM

## 2014-06-15 DIAGNOSIS — E785 Hyperlipidemia, unspecified: Secondary | ICD-10-CM

## 2014-06-15 DIAGNOSIS — I10 Essential (primary) hypertension: Secondary | ICD-10-CM

## 2014-06-15 DIAGNOSIS — R7309 Other abnormal glucose: Secondary | ICD-10-CM

## 2014-06-15 DIAGNOSIS — R0602 Shortness of breath: Secondary | ICD-10-CM

## 2014-06-15 DIAGNOSIS — K219 Gastro-esophageal reflux disease without esophagitis: Secondary | ICD-10-CM

## 2014-06-15 DIAGNOSIS — M545 Low back pain, unspecified: Secondary | ICD-10-CM

## 2014-06-15 DIAGNOSIS — I872 Venous insufficiency (chronic) (peripheral): Secondary | ICD-10-CM

## 2014-06-15 DIAGNOSIS — R609 Edema, unspecified: Secondary | ICD-10-CM

## 2014-06-15 DIAGNOSIS — K5730 Diverticulosis of large intestine without perforation or abscess without bleeding: Secondary | ICD-10-CM

## 2014-06-15 DIAGNOSIS — M4800 Spinal stenosis, site unspecified: Secondary | ICD-10-CM

## 2014-06-15 MED ORDER — AMLODIPINE BESYLATE 5 MG PO TABS: 5.0000 mg | ORAL_TABLET | Freq: Every day | ORAL | Status: DC

## 2014-06-15 MED ORDER — HYDROCHLOROTHIAZIDE 12.5 MG PO TABS: 12.5000 mg | ORAL_TABLET | Freq: Every day | ORAL | Status: DC

## 2014-06-15 NOTE — Patient Instructions (Signed)
Today we updated your med list in our EPIC system...    We decided to STOP the Losartan & Lasix & switch to>    Amlodipine (Norvasc) 5mg  one tab daily, and     HCT 12.5mg  one tab daily...  Remember to eliminate salt/ sodium from your diet...   Don't be afraid to try the Klonopin 1/2 tab as needed for your "spells"...  Call for any questions...  Let's plan a follow up visit in 22mo, sooner if needed for problems.[name]..[name]

## 2014-06-18 ENCOUNTER — Ambulatory Visit: Payer: Medicare Other | Admitting: Pulmonary Disease

## 2014-06-19 ENCOUNTER — Encounter: Payer: Self-pay | Admitting: Pulmonary Disease

## 2014-06-19 NOTE — Progress Notes (Signed)
Subjective:    Patient ID: [name], female    DOB: 1923/06/09, 80 y.o.   MRN: [id number]  HPI 80 y/o WF here for a follow up visit... she has multiple medical problems as noted below...  Followed for general medical purposes w/ hx chr obstructive asthma, HBP, RBBB, Hypercholesterolemia, borderline DM, DJD, LBP w/ sp stenosis, etc...  ~  May 13, 2012:  40moROV & she has had multiple subspecialty visits in the interval (see below)> in addition she is c/o "my blood veins are coming back out" noting some swelling in the legs but she can't wear any support hose due to discomfort- I explained that she is then relegated to no salt & elevation for control...    She saw GI> DrPerry & PGuenter in April/May for indigestion; she had mult complaints, most improved w/ Prilosec..[name]    She saw DrMacDiarmid 5/13 for Urology f/u of her recurrent UTIs, chronic cystitis, urge & stress incont, nocturia> hx intol Cipro/ Levaquin, she did tol Septra, he is following her cultures & discussed asymptomatic bacturia w/ them...    She saw DrRamos 5/13 for leg pain> he felt her leg discomfort was coming from lumbar DDD w/ spinal stenosis at L3-5 & L4-5; he gave her another [facility] continues to follow her progress; she had seen DrNudelman several yrs ago as well... We reviewed prob list, meds, xrays and labs> see below>>  ~  September 16, 2012:  426moOV & [name] notes sl better w/ her exercise program, notes DOE but mowing on her riding mower etc;  Stable on her Advair100, ProairHFA, MMW... BP controlled on meds, Chol & DM controlled on diet alone, GI & GU stable and she saw Urology, DrMacDiarmid- hx recurrent UTIs, chr cystitis, urge&stress incont... She also has DJD, LBP, spinal stenosis w/ ESI L5-S1 from DrRamos but not much relief she says & uses Celebrex prn... Hx panic attacks that required her to go outside for "air" & notes the "prilosec" helps ?!*    We reviewed prob list, meds, xrays and labs> see below for updates  >> OK Flu shot today...  ~  February 10, 2013:  13m21moV & [name]'s CC is pain & numbness in her legs- very uncomfortable, knees down, feet burning, & feel cold most of the time, awaiting shot in her back from DrRamos; we discussed trial Lyrica50m68m the interim... We reviewed the following medical problems during today's office visit >>     Chr Obstructive Asthma> stable on Advair100 & Proventil prn; notes breathing at baseline & Prn Klonopin helps dyspnea...    HBP> on Norvasc10, HCTZ25; BP=136/60, tol meds well; denies CP, palpit, ch in SOB, edema, etc...    CHOL> on diet alone, refuses meds, FLP 3/14 shows TChol 177, TG 50, HDL 60, LDL 107    DM> on diet alone, wt stable ~174#, BS=97, last A1c (2/13) was 6.3 & she knows to restrict carbs etc...    GI- Reflux, Divertics, constip> on Protonix40, Miralax, Senakot-S; continue same meds..   Marland KitchenDJD/ LBP> on Celebrex & osteobiflex prn; had right THR 2011; known sp stenosis w/ prev ESI... We reviewed prob list, meds, xrays and labs> see below for updates >>   CXR 2/14 showed normal heart size, clear lungs w/ sl peribronch thickening, DJD in spine, NAD...  LABS 3/14:  FLP- at goals on diet x LDL=107;  Chems- ok x TCO2=36;  CBC- wnl;  TSH=1.93;  UA- clear...  ~  August 17, 2013:  57moROV & [name] continues to do well at 89 no new complaints or concerns...    COPD stable on Advair100 & only using it prn she says; no recent resp exac & denies cough, sput, hemoptysis, SOB, CP, etc...    BP controlled on Amlod5 & Hct25-1/2 daily; BP= 130/70 & she denies CP, palpit, dizzy, SOB, edema, etc...     Chol & BS controlled on diet alone; weight is down 4# to 169# today...    GI is stable on Protonix40, antiireflux regimen, & Miralax...     DJD/ LBP treated w/ Vits, Osteobiflex, Celebrex200 prn & Neurontin100 prn- this is the way she likes it! We reviewed prob list, meds, xrays and labs> see below for updates >> ok 2014 Flu vaccine today...  ~  February 15, 2014:  621moOV & [name] recently saw TP w/ incr SOB/DOE walking & Qhs, swelling in legs, etc; CXR & Labs were ok; she changed her Hct to Lasix & improved... We reviewed the following medical problems during today's office visit >>     Chr Obstructive Asthma> stable on Advair100 (but only using it prn) & Proventil prn; notes breathing back to baseline & Prn Klonopin helps dyspnea...    HBP> on ASA81, Norvasc5, Lasix20- 1-2/d; BP=136/70, tol meds well; denies CP, palpit, ch in SOB, but notes persist edema in legs; Rec to ch Amlod5 to Losar50, low sodium, elev legs, Lasix40...    CHOL> on diet alone, refuses meds, FLP 3/14 shows TChol 177, TG 50, HDL 60, LDL 107    DM> on diet alone, wt up slightly to  ~179#, BS=95, last A1c (2/13) was 6.3 & she knows to restrict carbs etc...    GI- Reflux, Divertics, constip> on Protonix40, Miralax, Senakot-S; continue same meds.. [name]  DJD/ LBP> on Celebrex & osteobiflex prn; had right THR 2011; known sp stenosis w/ prev ESI... We reviewed prob list, meds, xrays and labs> see below for updates >>   CXR 3/15 showed norm heart size, clear lungs, elev of right hemidiaph, NAD...  LABS 3/15:  Chems- wnl;  CBC- wnl;  BNP=26...  ~  Apr 14, 2014:  13m59moV & add-on appt requested for "indigestion"> difficult hx but pt appears to be c/o swallowing difficulty, dysphagia, heartburn, & indigestion according to the daugh; she's had gas, belching, hoarseness, SOB, and early satiety; also notes constipation & took mag citrate for relief;  Epic review indicates hx indigestion & reflux symptoms- treated by DrPerry w/ PPI, she had EGD & Colon 2002;  Notes food sticking in mid-esoph w/ pain, nausea despite her Prevacid30 qam...  We decided to increase the PPI- Prevacid30Bid and refer to DrPerry for EGD and dilatation....    She is also c/o incr ankle edema; on Lasix20, no salt, etc;  We discussed increase diuretic to 13m43m and re-double efforts at sodium restriction, elevation, support hose,  etc...     BP remains stable ooff Amlod, on Losar50, Lasix40;  BP= 138/80 today & she denies angina pain, CHF, etc...  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     She also had some ankle edema while on Lasix20 and was told no salt, incr Lasix to $Remove'40mg'JMCJHZh$ /d & elevate/ support hose/ etc... Once again daughter has changed her meds based on perceived side effects ("causing head spells- I feel my heart in my head"); they stopped the Lasix & she does not want to restart- asking to go back on prev meds= Amlod5 & HCT12.5 daily, OK...    Similarly she as Klonopin 0.$RemoveBefor'5mg'CZgQKJIYRWTw$  to take 1/2 to 1 tab as needed for dyspnea or anxiety, and she tells me she hasn't even tried it one time!          Problem List:       DYSPNEA (ICD-786.05) - long hx of chronic obstructive asthma treated w/ ADVAIR100Bid & PROAIR (she uses them Prn now)... she is a non-smoker w/ some reactive airways disease in the past & retired from [employer] after 33 years in 1991... she denies cough, sputum, hemoptysis, worsening dyspnea, wheezing, chest pains, snoring, daytime hypersomnolence, etc...  ~  baseline CXR w/o acute changes...  ~  PFT's 5/02 w/ FVC 2.07 (68%), FEV1=1.36 (57%), and FEV1/FVC ratio=66%, mid-flows 42%... ~  CT Angio 7/10 was neg- x biapical pleuroparenchymal scarring... ~  CXR 10/11 showed sl elev right hemidaiph, mild DJD sp, osteopenia,  NAD.[name]. ~  CXR 6/12 showed mild apical scarring, clear & NAD, DJD sp w/ osteophytes... ~  Intermittent dyspnea more related to anxiety & treated w/ KLONOPIN 0.$RemoveBefor'5mg'vVdYEHDgqLkh$  1/2 to 1 tab Bid==> improved. ~  CXR 4/13 showed normal heart size, clear lungs, DJD in TSpine... ~  CXR 2/14 showed normal heart size, clear lungs w/ sl peribronch thickening, DJD in spine, NAD.[name]. ~  CXR 3/15 showed norm heart size, clear lungs, elev of right hemidiaph, NAD...  HYPERTENSION (ICD-401.9) - controlled on NORVASC $RemoveBe'5mg'ZdmWfrKGe$  daily & HCTZ $Remo'25mg'KHeEX$ tab daily...  ~  2/13:  BP 144/70 today> tol rx well & denies HA, visual changes, CP, palipit, dizziness, syncope, edema, etc... ~  4/13:  BP= 148/80 & she denies CP, palpit, edema; dyspnea improved w/ Klonopin. ~  6/13:  BP= 132/68 & as noted she has mult somatic complaints... ~  3/14:  on Norvasc10, HCTZ25; BP=136/60, tol meds well; denies CP, palpit, ch in SOB, edema, etc  ~  9/14:  BP controlled on Amlod5 & Hct25-1/2 daily; BP= 130/70 & she denies CP, palpit, dizzy, SOB, edema, etc. ~  3/15: on ASA81, Norvasc5, Lasix20- 1-2/d; BP=136/70, tol meds well; denies CP, palpit, ch in SOB, but notes persist edema in legs; Rec to ch Amlod5 to Losar50, low sodium, elev legs, Lasix40. ~  5/15: on Losar50, Lasix40;  BP= 138/60 & she denies angina pain, palpit, ch in SOB, etc... ~  7/15: on Losar50, she stopped Lasix; BP= 138/78 and they want to go back on Amlod5 + HCT12.5 daily...  RIGHT BUNDLE BRANCH BLOCK (ICD-426.4) - on ASA $Remo'81mg'zFOXW$ /d... baseline EKG w/ RBBB and 2DEcho 5/02 showed mild asymmetric LVH w/ incr EF...  VENOUS INSUFFIC & EDEMA >>  ~  3/15: she was switched off Amlod & onto Losar50, Lasix20=>40, plus no salt, elevation, support hose, etc...   HYPERLIPIDEMIA (ICD-272.4) - on diet alone... she forgets to come to visits FASTING for this blood work ~  FLP 8/07 showed TChol 205, TG 71, HDL 49, LDL 130... ~  FLP 6/12 on diet alone showed TChol 218, TG 50, HDL 66, LDL 129 ~  FLP 2/13 on diet  alone showed TChol 171, TG 43, HDL 66, LDL 97 ~  FLP 3/14 on diet alone showed TChol 177, TG 50, HDL 60, LDL 107 ~  She needs to ret FASTING for f/u FLP...  DIABETES MELLITUS, BORDERLINE (ICD-790.29) - on diet alone w/ prev BS's in the 100-160 range... ~  labs in 2008-9 showed BS= 101 to 108 ~  labs 1/10 showed BS= 106, A1c= 5.9 ~  Labs 6/12 showed BS= 93, A1c= 6.6.Marland KitchenMarland Kitchen rec diet, exercise... ~  Labs 2/13 showed BS= 92, A1c= 6.3 ~  3/14: on diet alone, wt stable ~174#, BS=97, last A1c (2/13) was 6.3 & she knows to restrict carbs etc. ~  Labs 3/15 showed BS= 95  INDIGESTION/ REFLUX SYMPTOMS >> see 10/12 note & PROTONIX $RemoveBe'40mg'mTSXTKoeK$ /d started, further eval if symptoms persist... ~  4/13:  She notes some reflux symptoms and excess gas w/ belching; rec to take the Protonix daily & Simethacone vs Tums which she says helps her gas. ~  5/13:  She saw GI DrPerry w/ rec to take Prilosec for her indigestion... ~  5/15:  She presented w/ worsening indigestion, dysphagia, reflux & Prev30 was incr to Bid w/ GI f/u suggested for EGD...  DIVERTICULOSIS OF COLON (ICD-562.10) - she takes SENAKOT-S, MIRALAX, Peppermint Tea, & sauerkraut Prn...last colonoscopy 9/02 by DrPerry was WNL...  PYELONEPHRITIS (ICD-590.80) - SEE 1/09 Hospitalization (reviewed)... ~  She saw DrMacDiarmid for her recurrent UTIs, chronic cystitis, urge & stress incont, nocturia; she is INTOL to [place]...  Hx of BREAST CYST (ICD-610.0)  DEGENERATIVE JOINT DISEASE (ICD-715.90) - s/p right hip hemiarthroplasty 11/09 by DrAplington w/ wound complic... then dx w/ loosening of the femoral shaft & had conversion to right THR by DrAlusio 10/11 & much improved... she uses CELEBREX $RemoveBefor'200mg'ZPKHSzKirhrh$  Prn (seldom takes this).  LOW BACK PAIN SYNDROME (ICD-724.2) & SPINAL STENOSIS (ICD-724.00) - severe LBP & spinal stenosis w/ evals by DrRamos & DrNudelman... s/p shots, considering poss surgery vs alternative therapies... she takes Celebrex, Osteobiflex, MVI, Vit  D... ~  8/10: eval by DrAplington- diff leg lengths, lift placed in right shoe, then trial Lyrica$RemoveBeforeDE'50mg'CVXtPDXgPiGXvXH$ ... ~  12/11:  improved after hip revision surg (to THR) 10/11 w/ better ambulaton... ~  5/13:  DrRamos gave her another ESI for her leg pain related to sp stenosis... ~  3/14:  C/o neuropathic discomfort in legs- eval by Ramos & offered shots in her back; try Lyrica50 in the interim...  Hx of ANEMIA (ICD-285.9) - eval by GI in 2002 showed normal EGD and Colon... prob iron malabsorption problem Rx'd w/ Fe infusion... ~  labs 1/10 showed Hg= 14.6, MCV= 89, Fe= 94 ~  labs 12/11 showed Hg= 12.8, MCV= 90, Fe= 33... try Fe supplement + VitC... ~  Labs 6/12 showed Hg= 15.0 ~  Labs 2/13 showed Hg= 14.6 ~  Labs 2/14 showed Hg= 14.8 ~  Labs 3/15 showed Hg= 14.9  DERM:  rash Rx'd by dermatology- OLUX-E foam= clobetasol Foam 0.05%...   Past Surgical History  Procedure Laterality Date  . Cataract extraction    . Right hip hemiarthroplasty      total  . Conversion to right thr      Outpatient Encounter Prescriptions as of 06/15/2014  Medication Sig  . albuterol (PROVENTIL HFA;VENTOLIN HFA) 108 (90 BASE) MCG/ACT  inhaler Inhale 2 puffs into the lungs every 6 (six) hours as needed for wheezing or shortness of breath.  [name] aspirin 81 MG tablet Take 81 mg by mouth daily.    . cholecalciferol (VITAMIN D-400) 400 UNITS TABS tablet Take 400 Units by mouth daily.  . Diphenhyd-Hydrocort-Nystatin (FIRST-DUKES MOUTHWASH) SUSP Swish and swallow 5 mLs 4 (four) times daily as needed (pain, irritation). Gargle with 93m  . ferrous sulfate 325 (65 FE) MG tablet Take 325 mg by mouth daily with breakfast.  . glycerin adult (GLYCERIN ADULT) 2 G SUPP Place 1 suppository rectally once as needed for mild constipation or moderate constipation.  . Menthol, Topical Analgesic, (BIOFREEZE) 4 % GEL Apply 1 application topically daily as needed (pain). As needed  . Misc Natural Products (OSTEO BI-FLEX ADV DOUBLE ST PO) Take 1  tablet by mouth daily.   . Multiple Vitamin (MULTIVITAMIN WITH MINERALS) TABS tablet Take 1 tablet by mouth daily.  . Multiple Vitamins-Minerals (CENTRUM SILVER PO) Take by mouth. Every day  . pantoprazole (PROTONIX) 40 MG tablet Take 40 mg by mouth 2 (two) times daily.  . polyethylene glycol (MIRALAX / GLYCOLAX) packet Take 17 g by mouth daily as needed for mild constipation.   . [DISCONTINUED] losartan (COZAAR) 50 MG tablet Take 1 tablet (50 mg total) by mouth daily.  .Marland KitchenamLODipine (NORVASC) 5 MG tablet Take 1 tablet (5 mg total) by mouth daily.  . clonazePAM (KLONOPIN) 0.5 MG tablet Take 1 tablet (0.5 mg total) by mouth every evening.  . hydrochlorothiazide (HYDRODIURIL) 12.5 MG tablet Take 1 tablet (12.5 mg total) by mouth daily.  . [DISCONTINUED] furosemide (LASIX) 40 MG tablet Take 1 tablet (40 mg total) by mouth daily.    Allergies  Allergen Reactions  . Azithromycin Shortness Of Breath    Trouble breathing  . Ciprofloxacin Other (See Comments)    REACTION: hallucinations  . Levofloxacin Other (See Comments)    Insomnia, indigestion, tingling sensation in legs  . Latex Rash    rash    Current Medications, Allergies, Past Medical History, Past Surgical History, Family History, and Social History were reviewed in [employer].    Review of Systems         See HPI - all other systems neg except as noted... The patient complains of decreased hearing, dyspnea on exertion, muscle weakness, and difficulty walking.  The patient denies anorexia, fever, weight loss, weight gain, vision loss, hoarseness, chest pain, syncope, peripheral edema, prolonged cough, headaches, hemoptysis, abdominal pain, melena, hematochezia, severe indigestion/heartburn, hematuria, incontinence, suspicious skin lesions, transient blindness, depression, unusual weight change, abnormal bleeding, enlarged lymph nodes, and angioedema.     Objective:   Physical Exam     WD, WN, Chr  ill appearing 80y/o WF in NAD... GENERAL:  Alert & oriented; pleasant & cooperative... HEENT:  [place]/AT, EOM-full, EACs-clear, TMs-wnl, NOSE-clear, THROAT-clear & wnl. NECK:  Supple w/ fairROM; no JVD; normal carotid impulses w/o bruits; no thyromegaly or nodules palpated; no lymphadenopathy. CHEST:  Clear to P & A; without wheezes/ rales/ or rhonchi heard... HEART:  Regular Rhythm; without murmurs/ rubs/ or gallops detected... ABDOMEN:  Soft & nontender; normal bowel sounds; no organomegaly or masses palpated... EXT:  mod arthritic changes, walks w/ cane, +venous insuffic & tr edema., scattered varicose veins... NEURO:  CN's intact; motor testing normal; no focal deficits... DERM:   mild intertrig rash under breast, & onychomycosis of toenails...  RADIOLOGY DATA:  Reviewed in the EPIC EMR & discussed  w/ the patient...  LABORATORY DATA:  Reviewed in the EPIC EMR & discussed w/ the patient...   Assessment & Plan:    DYSPNEA>  Hx asthma, stable on Advair, Proair; hx anxiety component on Klonopin but she is not using!  HBP>  Controlled on Losartan, Lasix, +diet, etc; but they want to return to [facility] + HCT12.5.Marland KitchenMarland Kitchen  RBBB>  Aware & denies CP, palpit, ch in [place], etc...  CHOL>  On diet alone & FLP looks reasonable;  We reviewed low chol, low fat diet...  DM>  BS= 95 & A1c is 6.3 when last checked;  on diet alone & we reviewed low carb no sweets etc...  GI> Indigestion, Divertics> she notes most bowel symptoms resolved off spicey food & prev decr PPI just prn; now c/o incr dysphagia & referred to GI for f/u EGD, etc...  UTI>  Klebsiella UTI resolved after Septra Rx... She has been eval by DrMacDiarmid.  DJD, LBP, Spinal Stenosis>  Prev evals by Ortho, DrRamos, DrNudelman etc; improved after THR w/ better ambulation; c/o neuropathic discomfort in legs- she will f/u w/ Ramos for shots, try Lyrica50 in the interim...  Anxiety>  If she would take the Klonopin, I feel it would  help...   Patient's Medications  New Prescriptions   AMLODIPINE (NORVASC) 5 MG TABLET    Take 1 tablet (5 mg total) by mouth daily.   HYDROCHLOROTHIAZIDE (HYDRODIURIL) 12.5 MG TABLET    Take 1 tablet (12.5 mg total) by mouth daily.  Previous Medications   ALBUTEROL (PROVENTIL HFA;VENTOLIN HFA) 108 (90 BASE) MCG/ACT INHALER    Inhale 2 puffs into the lungs every 6 (six) hours as needed for wheezing or shortness of breath.   ASPIRIN 81 MG TABLET    Take 81 mg by mouth daily.     CHOLECALCIFEROL (VITAMIN D-400) 400 UNITS TABS TABLET    Take 400 Units by mouth daily.   CLONAZEPAM (KLONOPIN) 0.5 MG TABLET    Take 1 tablet (0.5 mg total) by mouth every evening.   DIPHENHYD-HYDROCORT-NYSTATIN (FIRST-DUKES MOUTHWASH) SUSP    Swish and swallow 5 mLs 4 (four) times daily as needed (pain, irritation). Gargle with 63m   FERROUS SULFATE 325 (65 FE) MG TABLET    Take 325 mg by mouth daily with breakfast.   GLYCERIN ADULT (GLYCERIN ADULT) 2 G SUPP    Place 1 suppository rectally once as needed for mild constipation or moderate constipation.   MENTHOL, TOPICAL ANALGESIC, (BIOFREEZE) 4 % GEL    Apply 1 application topically daily as needed (pain). As needed   MISC NATURAL PRODUCTS (OSTEO BI-FLEX ADV DOUBLE ST PO)    Take 1 tablet by mouth daily.    MULTIPLE VITAMIN (MULTIVITAMIN WITH MINERALS) TABS TABLET    Take 1 tablet by mouth daily.   MULTIPLE VITAMINS-MINERALS (CENTRUM SILVER PO)    Take by mouth. Every day   PANTOPRAZOLE (PROTONIX) 40 MG TABLET    Take 40 mg by mouth 2 (two) times daily.   POLYETHYLENE GLYCOL (MIRALAX / GLYCOLAX) PACKET    Take 17 g by mouth daily as needed for mild constipation.   Modified Medications   No medications on file  Discontinued Medications   FUROSEMIDE (LASIX) 40 MG TABLET    Take 1 tablet (40 mg total) by mouth daily.   LOSARTAN (COZAAR) 50 MG TABLET    Take 1 tablet (50 mg total) by mouth daily.

## 2014-06-24 ENCOUNTER — Telehealth: Payer: Self-pay | Admitting: Pulmonary Disease

## 2014-06-24 NOTE — Telephone Encounter (Signed)
LMTCB

## 2014-06-24 NOTE — Telephone Encounter (Signed)
Spoke with the pt's daughter, [name]  She was reading up on B-12 Def and believes that the pt has this due to her fatigue and occ tingling in her hands  Daughter is asking if SN thinks that she should start taking otc b-12 supplement  Please advise thanks!

## 2014-06-25 NOTE — Telephone Encounter (Signed)
Per SN---  Ok for the pt to take b12 tablets ---SN advises  500-102100mcg once daily.    i have called pts daughter and lmom to call back.

## 2014-06-25 NOTE — Telephone Encounter (Signed)
Daughter called back. Made aware of recs. Nothing further needed

## 2014-06-28 ENCOUNTER — Telehealth: Payer: Self-pay | Admitting: Pulmonary Disease

## 2014-06-28 DIAGNOSIS — H9313 Tinnitus, bilateral: Secondary | ICD-10-CM

## 2014-06-28 NOTE — Telephone Encounter (Signed)
Called spoke with patient's daughter.  Per [name], pt continues to have ringing in her ears occasionally at bedtime x4-5 weeks.  She has been using sweet oil as recommended by SN but doesn't feel this is helping.  [name] reported that the last time pt was having issues with tinnitus SN referred her out and remembers this helping.  [name] did also mention some concern about a friend mentioning carotid blockage and would like to make sure SN does not think this is the cause.  Dr Kriste BasqueNadel please advise, thank you.

## 2014-06-28 NOTE — Telephone Encounter (Signed)
Per SN---  Call in antivert 25 mg  #50  1 po every 4-6 hours as needed for dizziness.  Refer to ENT for tinnitus.  Called and spoke with pts daughter and she stated that there is no need to call in the antivert due to the pt will not use this medication.  Order has been placed for the pt to get set up with ENT>  Nothing further is needed.

## 2014-07-30 ENCOUNTER — Encounter: Payer: Self-pay | Admitting: Pulmonary Disease

## 2014-07-30 ENCOUNTER — Ambulatory Visit (INDEPENDENT_AMBULATORY_CARE_PROVIDER_SITE_OTHER): Payer: Medicare Other | Admitting: Pulmonary Disease

## 2014-07-30 VITALS — BP 128/80 | HR 71 | Temp 97.6°F | Ht 68.0 in | Wt 168.0 lb

## 2014-07-30 DIAGNOSIS — R269 Unspecified abnormalities of gait and mobility: Secondary | ICD-10-CM

## 2014-07-30 DIAGNOSIS — I4510 Unspecified right bundle-branch block: Secondary | ICD-10-CM

## 2014-07-30 DIAGNOSIS — R609 Edema, unspecified: Secondary | ICD-10-CM

## 2014-07-30 DIAGNOSIS — M545 Low back pain, unspecified: Secondary | ICD-10-CM

## 2014-07-30 DIAGNOSIS — K219 Gastro-esophageal reflux disease without esophagitis: Secondary | ICD-10-CM

## 2014-07-30 DIAGNOSIS — E785 Hyperlipidemia, unspecified: Secondary | ICD-10-CM

## 2014-07-30 DIAGNOSIS — I10 Essential (primary) hypertension: Secondary | ICD-10-CM

## 2014-07-30 DIAGNOSIS — R7309 Other abnormal glucose: Secondary | ICD-10-CM

## 2014-07-30 DIAGNOSIS — Z23 Encounter for immunization: Secondary | ICD-10-CM

## 2014-07-30 DIAGNOSIS — I872 Venous insufficiency (chronic) (peripheral): Secondary | ICD-10-CM

## 2014-07-30 DIAGNOSIS — R0602 Shortness of breath: Secondary | ICD-10-CM

## 2014-07-30 DIAGNOSIS — M1990 Unspecified osteoarthritis, unspecified site: Secondary | ICD-10-CM

## 2014-07-30 DIAGNOSIS — M4800 Spinal stenosis, site unspecified: Secondary | ICD-10-CM

## 2014-07-30 MED ORDER — NEXIUM 40 MG PO CPDR: 40.0000 mg | DELAYED_RELEASE_CAPSULE | Freq: Two times a day (BID) | ORAL | Status: DC

## 2014-07-30 NOTE — Progress Notes (Signed)
Subjective:    Patient ID: [name], female    DOB: October 20, 1923, 80 y.o.   MRN: [id number]  HPI 80 y/o WF here for a follow up visit... she has multiple medical problems as noted below...  Followed for general medical purposes w/ hx chr obstructive asthma, HBP, RBBB, Hypercholesterolemia, borderline DM, DJD, LBP w/ sp stenosis, etc... ~  SEE PREV EPIC NOTES FOR THE OLDER DATA >>   ~  February 10, 2013:  45moROV & [name]'s CC is pain & numbness in her legs- very uncomfortable, knees down, feet burning, & feel cold most of the time, awaiting shot in her back from DrRamos; we discussed trial Lyrica568min the interim... We reviewed the following medical problems during today's office visit >>     Chr Obstructive Asthma> stable on Advair100 & Proventil prn; notes breathing at baseline & Prn Klonopin helps dyspnea...    HBP> on Norvasc10, HCTZ25; BP=136/60, tol meds well; denies CP, palpit, ch in SOB, edema, etc...    CHOL> on diet alone, refuses meds, FLP 3/14 shows TChol 177, TG 50, HDL 60, LDL 107    DM> on diet alone, wt stable ~174#, BS=97, last A1c (2/13) was 6.3 & she knows to restrict carbs etc...    GI- Reflux, Divertics, constip> on Protonix40, Miralax, Senakot-S; continue same meds.. [name]  DJD/ LBP> on Celebrex & osteobiflex prn; had right THR 2011; known sp stenosis w/ prev ESI... We reviewed prob list, meds, xrays and labs> see below for updates >>   CXR 2/14 showed normal heart size, clear lungs w/ sl peribronch thickening, DJD in spine, NAD...  LABS 3/14:  FLP- at goals on diet x LDL=107;  Chems- ok x TCO2=36;  CBC- wnl;  TSH=1.93;  UA- clear...  ~  August 17, 2013:  56m63moV & [name] continues to do well at 88;89o new complaints or concerns...    COPD stable on Advair100 & only using it prn she says; no recent resp exac & denies cough, sput, hemoptysis, SOB, CP, etc...    BP controlled on Amlod5 & Hct25-1/2 daily; BP= 130/70 & she denies CP, palpit, dizzy, SOB, edema,  etc...     Chol & BS controlled on diet alone; weight is down 4# to 169# today...    GI is stable on Protonix40, antiireflux regimen, & Miralax...     DJD/ LBP treated w/ Vits, Osteobiflex, Celebrex200 prn & Neurontin100 prn- this is the way she likes it! We reviewed prob list, meds, xrays and labs> see below for updates >> ok 2014 Flu vaccine today...  ~  February 15, 2014:  557mo34mo & [name] recently saw TP w/ incr SOB/DOE walking & Qhs, swelling in legs, etc; CXR & Labs were ok; she changed her Hct to Lasix & improved... We reviewed the following medical problems during today's office visit >>     Chr Obstructive Asthma> stable on Advair100 (but only using it prn) & Proventil prn; notes breathing back to baseline & Prn Klonopin helps dyspnea...    HBP> on ASA81, Norvasc5, Lasix20- 1-2/d; BP=136/70, tol meds well; denies CP, palpit, ch in SOB, but notes persist edema in legs; Rec to ch Amlod5 to Losar50, low sodium, elev legs, Lasix40...    CHOL> on diet alone, refuses meds, FLP 3/14 shows TChol 177, TG 50, HDL 60, LDL 107    DM> on diet alone, wt up slightly to  ~179#, BS=95, last A1c (2/13) was 6.3 & she knows to restrict  carbs etc...    GI- Reflux, Divertics, constip> on Protonix40, Miralax, Senakot-S; continue same meds.[name]    DJD/ LBP> on Celebrex & osteobiflex prn; had right THR 2011; known sp stenosis w/ prev ESI... We reviewed prob list, meds, xrays and labs> see below for updates >>   CXR 3/15 showed norm heart size, clear lungs, elev of right hemidiaph, NAD...  LABS 3/15:  Chems- wnl;  CBC- wnl;  BNP=26...  ~  Apr 14, 2014:  77moROV & add-on appt requested for "indigestion"> difficult hx but pt appears to be c/o swallowing difficulty, dysphagia, heartburn, & indigestion according to the daugh; she's had gas, belching, hoarseness, SOB, and early satiety; also notes constipation & took mag citrate for relief;  Epic review indicates hx indigestion & reflux symptoms- treated by DrPerry w/ PPI, she  had EGD & Colon 2002;  Notes food sticking in mid-esoph w/ pain, nausea despite her Prevacid30 qam...  We decided to increase the PPI- Prevacid30Bid and refer to DrPerry for EGD and dilatation....    She is also c/o incr ankle edema; on Lasix20, no salt, etc;  We discussed increase diuretic to 467mam and re-double efforts at sodium restriction, elevation, support hose, etc...     BP remains stable ooff Amlod, on Losar50, Lasix40;  BP= 138/80 today & she denies angina pain, CHF, etc...  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     She also had some ankle edema while on Lasix20 and was told no salt, incr Lasix to 32m60m& elevate/ support hose/ etc... Once again daughter has changed her meds based on perceived side effects ("causing head spells- I feel my heart in my head"); they stopped the Lasix & she does not want to restart- asking to go back on prev meds= Amlod5 & HCT12.5 daily, OK...    Similarly she as Klonopin 0.5mg 16mtake 1/2 to 1 tab as needed for dyspnea or anxiety, and she tells me she hasn't even tried it one time!  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  I have again recommended that the pt try to  increase her Klonopin from 0.74m Qhs to one Bid or perhaps try 1/2 in AM, 1/2 in afternoon, and 1 at bedtime...     Finally the daughter asked about ordering some Physical Therapy for her mother; they feel she is getting weaker esp in legs, slower, and balance is off; we reviewed the need for exercise and we will request PT consult & their involvement We reviewed prob list, meds, xrays and labs> see below for updates >> OK 2015 flu vaccine today          Problem List:       DYSPNEA (ICD-786.05) - long hx of chronic obstructive asthma treated w/ ADVAIR100Bid & PROAIR (she uses them Prn now)... she is a non-smoker w/ some reactive airways disease in the past & retired from [employer] years in 1991... she denies cough, sputum, hemoptysis, worsening dyspnea, wheezing, chest pains, snoring, daytime hypersomnolence, etc...  ~  baseline CXR w/o acute changes...  ~  PFT's 5/02 w/ FVC 2.07 (68%), FEV1=1.36 (57%), and FEV1/FVC ratio=66%, mid-flows 42%... ~  CT Angio 7/10 was neg- x biapical pleuroparenchymal scarring... ~  CXR 10/11 showed sl elev right hemidaiph, mild DJD sp, osteopenia, NAD..[name] ~  CXR 6/12 showed mild apical scarring, clear & NAD, DJD sp w/ osteophytes... ~  Intermittent dyspnea more related to anxiety & treated w/ KLONOPIN 0.540m1/2 to 1 tab Bid==> improved. ~  CXR 4/13 showed normal heart size, clear lungs, DJD in TSpine... ~  CXR 2/14 showed normal heart size, clear lungs w/ sl  peribronch thickening, DJD in spine, NAD...[name]~  CXR 3/15 showed norm heart size, clear lungs, elev of right hemidiaph, NAD... ~  She is encouraged to take the Klonopin 0.38m72mid regularly- consider taking 1/2 in AM, 1/2 in afternoon, one at bedtime...  HYPERTENSION (ICD-401.9) - controlled on NORVASC 38mg64mily & HCTZ 238mg54mdaily...  ~  2/13:  BP 144/70 today> tol rx well & denies HA, visual changes, CP, palipit, dizziness, syncope, edema, etc... ~  4/13:  BP= 148/80 & she denies CP, palpit, edema; dyspnea improved w/ Klonopin. ~  6/13:  BP= 132/68 & as noted she has mult somatic complaints... ~  3/14:  on Norvasc10, HCTZ25; BP=136/60, tol meds well; denies CP, palpit, ch in SOB, edema, etc  ~  9/14:  BP controlled on Amlod5 & Hct25-1/2 daily; BP= 130/70 & she denies CP, palpit, dizzy, SOB, edema, etc. ~  3/15: on ASA81, Norvasc5, Lasix20- 1-2/d; BP=136/70, tol meds well; denies CP, palpit, ch in SOB, but notes persist edema in legs; Rec to ch Amlod5 to Losar50, low sodium, elev legs, Lasix40. ~  5/15: on Losar50, Lasix40;  BP= 138/60 & she denies angina pain, palpit, ch in SOB, etc... ~  7/15: on Losar50, she stopped Lasix; BP= 138/78 and they want to go back on Amlod5 + HCT12.5 daily... ~  9/15: on Amlod5, Hct12.5; BP= 128/80 & she likes this combo better- denies CP, palpit, etc...  RIGHT BUNDLE BRANCH BLOCK (ICD-426.4) - on ASA 81mg/31m baseline EKG w/ RBBB and 2DEcho 5/02 showed mild asymmetric LVH w/ incr EF...  VENOUS INSUFFIC & EDEMA >>  ~  3/15: she was switched off Amlod & onto Losar50, Lasix20=>40, plus no salt, elevation, support hose, etc...  ~  9/15: they preferred the Amlod5 7 HCT12.5 regimen; she has VI & 1+edema but stable, no acute changes...  HYPERLIPIDEMIA (ICD-272.4) - on diet alone... she forgets to come to visits FASTING for this blood work ~  FLP 8/07 showed TChol 205, TG 71, HDL 49, LDL 130... ~  FLP 6/12 on diet alone showed TChol 218, TG 50, HDL 66, LDL 129 ~  FLP  2/13 on diet alone showed TChol 171, TG 43, HDL 66, LDL 97 ~  FLP 3/14 on diet alone showed TChol 177, TG 50, HDL 60, LDL 107 ~  She needs to ret FASTING for f/u FLP...  DIABETES MELLITUS, BORDERLINE (ICD-790.29) - on diet alone w/ prev BS's in the 100-160 range... ~  labs in 2008-9 showed BS= 101 to 108 ~  labs 1/10 showed BS= 106, A1c= 5.9 ~  Labs 6/12 showed BS= 93, A1c= 6.6.Marland KitchenMarland Kitchen rec diet, exercise... ~  Labs 2/13 showed BS= 92, A1c= 6.3 ~  3/14: on diet alone, wt stable ~174#, BS=97, last A1c (2/13) was 6.3 & she knows to restrict carbs etc. ~  Labs 3/15 showed BS= 95; and BS= 120 in [id number]...   INDIGESTION/ REFLUX SYMPTOMS >> see 10/12 note & PROTONIX 57m/d started, further eval if symptoms persist... ESOPHAGEAL DYSMOTILITY/ PRESBYESOPHAGUS >>  ~  4/13:  She notes some reflux symptoms and excess gas w/ belching; rec to take the Protonix daily & Simethacone vs Tums which she says helps her gas. ~  5/13:  She saw GI DrPerry w/ rec to take Prilosec for her indigestion... ~  5/15:  She presented w/ worsening indigestion, dysphagia, reflux & Prev30 was incr to Bid w/ GI f/u suggested for EGD... 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 ~  9/15:  Symptoms persist but she is not regurg or vomiting, and weight stable; they want to change to Nexium40Bid-OK, and rec proceed w/ MBS by speech path...  DIVERTICULOSIS OF COLON (ICD-562.10) - she takes SENAKOT-S, MIRALAX, Peppermint Tea, & sauerkraut Prn...last colonoscopy 9/02 by DrPerry was WNL...  PYELONEPHRITIS (ICD-590.80) - SEE 1/09 Hospitalization (reviewed)... ~  She saw DrMacDiarmid for her recurrent UTIs, chronic cystitis, urge & stress incont,  nocturia; [place]  Hx of BREAST CYST (ICD-610.0)  DEGENERATIVE JOINT DISEASE (ICD-715.90) - s/p right hip hemiarthroplasty 11/09 by DrAplington w/ wound complic... then dx w/ loosening of the femoral shaft & had conversion to right THR by DrAlusio 10/11 & much improved... she uses CELEBREX 2017mPrn (seldom takes this).  LOW BACK PAIN SYNDROME (ICD-724.2) & SPINAL STENOSIS (ICD-724.00) - severe LBP & spinal stenosis w/ evals by DrRamos & DrNudelman... s/p shots, considering poss surgery vs alternative therapies... she takes Celebrex, Osteobiflex, MVI, Vit D... ~  8/10: eval by DrAplington- diff leg lengths, lift placed in right shoe, then trial Lyrica5073m. ~  12/11:  improved after hip revision surg (to THR) 10/11 w/ better ambulaton... ~  5/13:  DrRamos gave her another ESI for her leg pain related to sp stenosis... ~  3/14:  C/o neuropathic discomfort in legs- eval by Ramos & offered shots in her back; try Lyrica50 in the interim...  Hx of ANEMIA (ICD-285.9) - eval by GI in 2002 showed normal EGD and Colon... prob iron malabsorption problem Rx'd w/ Fe infusion... ~  labs 1/10 showed Hg= 14.6, MCV= 89, Fe= 94 ~  labs 12/11 showed Hg= 12.8,  MCV= 90, Fe= 33... try Fe supplement + VitC... ~  Labs 6/12 showed Hg= 15.0 ~  Labs 2/13 showed Hg= 14.6 ~  Labs 2/14 showed Hg= 14.8 ~  Labs 3/15 showed Hg= 14.9  DERM:  rash Rx'd by dermatology- OLUX-E foam= clobetasol Foam 0.05%...   Past Surgical History  Procedure Laterality Date  . Cataract extraction    . Right hip hemiarthroplasty      total  . Conversion to right thr      Outpatient Encounter Prescriptions as of 07/30/2014  Medication Sig  . amLODipine (NORVASC) 5 MG tablet Take 1 tablet (5 mg total) by mouth daily.  [name] aspirin 81 MG tablet Take 81 mg by mouth daily.    . cholecalciferol (VITAMIN D-400) 400 UNITS TABS tablet Take 400 Units by mouth daily.  . clonazePAM (KLONOPIN) 0.5 MG tablet Take 1 tablet  (0.5 mg total) by mouth every evening.  . Diphenhyd-Hydrocort-Nystatin (FIRST-DUKES MOUTHWASH) SUSP Swish and swallow 5 mLs 4 (four) times daily as needed (pain, irritation). Gargle with 30m  . ferrous sulfate 325 (65 FE) MG tablet Take 325 mg by mouth daily with breakfast.  . glycerin adult (GLYCERIN ADULT) 2 G SUPP Place 1 suppository rectally once as needed for mild constipation or moderate constipation.  . hydrochlorothiazide (HYDRODIURIL) 12.5 MG tablet Take 1 tablet (12.5 mg total) by mouth daily.  . Menthol, Topical Analgesic, (BIOFREEZE) 4 % GEL Apply 1 application topically daily as needed (pain). As needed  . Misc Natural Products (OSTEO BI-FLEX ADV DOUBLE ST PO) Take 1 tablet by mouth daily.   . Multiple Vitamin (MULTIVITAMIN WITH MINERALS) TABS tablet Take 1 tablet by mouth daily.  . Multiple Vitamins-Minerals (CENTRUM SILVER PO) Take by mouth. Every day  . polyethylene glycol (MIRALAX / GLYCOLAX) packet Take 17 g by mouth daily as needed for mild constipation.   . [DISCONTINUED] pantoprazole (PROTONIX) 40 MG tablet Take 40 mg by mouth 2 (two) times daily.  .Marland Kitchenalbuterol (PROVENTIL HFA;VENTOLIN HFA) 108 (90 BASE) MCG/ACT inhaler Inhale 2 puffs into the lungs every 6 (six) hours as needed for wheezing or shortness of breath.  .Marland KitchenNEXIUM 40 MG capsule Take 1 capsule (40 mg total) by mouth 2 (two) times daily before a meal.    Allergies  Allergen Reactions  . Azithromycin Shortness Of Breath    Trouble breathing  . Ciprofloxacin Other (See Comments)    REACTION: hallucinations  . Levofloxacin Other (See Comments)    Insomnia, indigestion, tingling sensation in legs  . Latex Rash    rash    Current Medications, Allergies, Past Medical History, Past Surgical History, Family History, and Social History were reviewed in [employer].    Review of Systems         See HPI - all other systems neg except as noted... The patient complains of decreased  hearing, dyspnea on exertion, muscle weakness, and difficulty walking.  The patient denies anorexia, fever, weight loss, weight gain, vision loss, hoarseness, chest pain, syncope, peripheral edema, prolonged cough, headaches, hemoptysis, abdominal pain, melena, hematochezia, severe indigestion/heartburn, hematuria, incontinence, suspicious skin lesions, transient blindness, depression, unusual weight change, abnormal bleeding, enlarged lymph nodes, and angioedema.     Objective:   Physical Exam     WD, WN, Chr ill appearing 80y/o WF in NAD... GENERAL:  Alert & oriented; pleasant & cooperative... HEENT:  [place]/AT, EOM-full, EACs-clear, TMs-wnl, NOSE-clear, THROAT-clear & wnl. NECK:  Supple w/ fairROM; no JVD; normal carotid impulses w/o  bruits; no thyromegaly or nodules palpated; no lymphadenopathy. CHEST:  Clear to P & A; without wheezes/ rales/ or rhonchi heard... HEART:  Regular Rhythm; without murmurs/ rubs/ or gallops detected... ABDOMEN:  Soft & nontender; normal bowel sounds; no organomegaly or masses palpated... EXT:  mod arthritic changes, walks w/ cane, +venous insuffic & tr edema., scattered varicose veins... NEURO:  CN's intact; motor testing normal; no focal deficits... DERM:   mild intertrig rash under breast, & onychomycosis of toenails...  RADIOLOGY DATA:  Reviewed in the EPIC EMR & discussed w/ the patient...  LABORATORY DATA:  Reviewed in the EPIC EMR & discussed w/ the patient...   Assessment & Plan:    DYSPNEA>  Hx asthma, stable off Advair, on Proair prn; hx anxiety component on Klonopin but she is not using!  HBP>  Controlled on Amlod5 + HCT12.5; continue same...  RBBB>  Aware & denies CP, palpit, ch in [place], etc...  CHOL>  On diet alone & FLP looks reasonable;  We reviewed low chol, low fat diet...  DM>  BS= 95 & A1c is 6.3 when last checked;  on diet alone & we reviewed low carb no sweets etc...  GI> Indigestion, Divertics> she notes most bowel symptoms resolved  off spicey foods;  UGI symptoms w/ dysphagia, food sticking, belch/gas/ etc have not resolved on Protonix40Bid and after GI consult w/ DrPerry; we reviewed her UGI series results and decided to proceed w/ MBS by Speech Path  UTI>  Klebsiella UTI resolved after Septra Rx... She has been eval by DrMacDiarmid.  DJD, LBP, Spinal Stenosis>  Prev evals by Ortho, DrRamos, DrNudelman etc; improved after THR w/ better ambulation; c/o neuropathic discomfort in legs- she will f/u w/ Ramos for shots, try Lyrica50 in the interim...  Anxiety>  If she would take the Klonopin, I feel it would help...   Patient's Medications  New Prescriptions   NEXIUM 40 MG CAPSULE    Take 1 capsule (40 mg total) by mouth 2 (two) times daily before a meal.  Previous Medications   ALBUTEROL (PROVENTIL HFA;VENTOLIN HFA) 108 (90 BASE) MCG/ACT INHALER    Inhale 2 puffs into the lungs every 6 (six) hours as needed for wheezing or shortness of breath.   AMLODIPINE (NORVASC) 5 MG TABLET    Take 1 tablet (5 mg total) by mouth daily.   ASPIRIN 81 MG TABLET    Take 81 mg by mouth daily.     CHOLECALCIFEROL (VITAMIN D-400) 400 UNITS TABS TABLET    Take 400 Units by mouth daily.   CLONAZEPAM (KLONOPIN) 0.5 MG TABLET    Take 1 tablet (0.5 mg total) by mouth every evening.   DIPHENHYD-HYDROCORT-NYSTATIN (FIRST-DUKES MOUTHWASH) SUSP    Swish and swallow 5 mLs 4 (four) times daily as needed (pain, irritation). Gargle with 76m   FERROUS SULFATE 325 (65 FE) MG TABLET    Take 325 mg by mouth daily with breakfast.   GLYCERIN ADULT (GLYCERIN ADULT) 2 G SUPP    Place 1 suppository rectally once as needed for mild constipation or moderate constipation.   HYDROCHLOROTHIAZIDE (HYDRODIURIL) 12.5 MG TABLET    Take 1 tablet (12.5 mg total) by mouth daily.   MENTHOL, TOPICAL ANALGESIC, (BIOFREEZE) 4 % GEL    Apply 1 application topically daily as needed (pain). As needed   MISC NATURAL PRODUCTS (OSTEO BI-FLEX ADV DOUBLE ST PO)    Take 1 tablet by mouth  daily.    MULTIPLE VITAMIN (MULTIVITAMIN WITH MINERALS) TABS TABLET  Take 1 tablet by mouth daily.   MULTIPLE VITAMINS-MINERALS (CENTRUM SILVER PO)    Take by mouth. Every day   POLYETHYLENE GLYCOL (MIRALAX / GLYCOLAX) PACKET    Take 17 g by mouth daily as needed for mild constipation.   Modified Medications   No medications on file  Discontinued Medications   PANTOPRAZOLE (PROTONIX) 40 MG TABLET    Take 40 mg by mouth 2 (two) times daily.

## 2014-07-30 NOTE — Patient Instructions (Signed)
Today we updated your med list in our EPIC system...    Continue your current medications the same...  Today we discussed proceeding w/ a Modified Barium swallow test by the Speech Pathologists at [facility]...    We will call you once we have seen their recommendations...  We changed the Protonix to Nexium per your request... Let's see if it works better for you...  We will also request a Physical Therrapy consult to work w/ you on your mobility, strength, balance, etc...  Call for any questions...  Let's plan a follow up visit in 74mo, sooner if needed for problems.Marland KitchenMarland Kitchen

## 2014-08-03 ENCOUNTER — Ambulatory Visit: Payer: Medicare Other | Admitting: Physical Therapy

## 2014-08-04 ENCOUNTER — Other Ambulatory Visit (HOSPITAL_COMMUNITY): Payer: Self-pay | Admitting: Pulmonary Disease

## 2014-08-04 ENCOUNTER — Telehealth: Payer: Self-pay | Admitting: Pulmonary Disease

## 2014-08-04 DIAGNOSIS — R1310 Dysphagia, unspecified: Secondary | ICD-10-CM

## 2014-08-04 NOTE — Telephone Encounter (Signed)
Per SN: best rx is OTC miralax (take 1 capful in water), Senekot-S (2 po QHS).  If no result, 1 bottle of mag citrate followed by lots of water.  Called spoke with patient's daughter [name], and advised of SN's recommendations as stated above.  [name] stated that since this morning pt has had 2 small BM's and does feel some better.  [name] will follow through with SN's recs for the Miralax and Senna tonight and add the mag citrate if pt does not have BM by tomorrow afternoon.  She will call the office back if anything changes or SN's recs to not yield results.  Nothing further needed at this time; will sign off.

## 2014-08-04 NOTE — Telephone Encounter (Signed)
Called spoke with patient's daughter who reports pt has not had a BM in approximately 1 week She has an enema Monday night, was able to pass a small amount with hard round balls - Some relief and was able to sleep well that night Another enema last night, passed a small amount again Suppository this morning with no results yet Patient takes 2 swallows of mag citrate every few days Takes Miralax 1/2 packet "every once in a while" Not much physical activity around the home No real stomach discomfort, or tenderness - just bloating and feeling full  Advised [name] will forward to SN for additional recommendations for now.  Did discuss with [name] about getting patient on regimen including stool softener 2 at bedtime and Miralax daily until regular, then decreasing to QOD as needed.  [name] will call the office back immediately if pt does begin showing signs of discomfort or stomach tenderness. Dr Kriste Basque please advise, thank you.

## 2014-08-07 ENCOUNTER — Emergency Department (HOSPITAL_COMMUNITY)
Admission: EM | Admit: 2014-08-07 | Discharge: 2014-08-07 | Disposition: A | Discharge status: Home / Self Care | Payer: Medicare Other | Attending: Emergency Medicine | Admitting: Emergency Medicine

## 2014-08-07 ENCOUNTER — Encounter (HOSPITAL_COMMUNITY): Payer: Self-pay | Admitting: Emergency Medicine

## 2014-08-07 DIAGNOSIS — Z860100 Personal history of colon polyps, unspecified: Secondary | ICD-10-CM | POA: Insufficient documentation

## 2014-08-07 DIAGNOSIS — Z7982 Long term (current) use of aspirin: Secondary | ICD-10-CM | POA: Diagnosis not present

## 2014-08-07 DIAGNOSIS — D649 Anemia, unspecified: Secondary | ICD-10-CM | POA: Diagnosis not present

## 2014-08-07 DIAGNOSIS — M1990 Unspecified osteoarthritis, unspecified site: Secondary | ICD-10-CM | POA: Diagnosis not present

## 2014-08-07 DIAGNOSIS — Z79899 Other long term (current) drug therapy: Secondary | ICD-10-CM | POA: Diagnosis not present

## 2014-08-07 DIAGNOSIS — K219 Gastro-esophageal reflux disease without esophagitis: Secondary | ICD-10-CM | POA: Diagnosis not present

## 2014-08-07 DIAGNOSIS — Z91040 Latex allergy status: Secondary | ICD-10-CM | POA: Diagnosis not present

## 2014-08-07 DIAGNOSIS — I10 Essential (primary) hypertension: Secondary | ICD-10-CM | POA: Diagnosis not present

## 2014-08-07 DIAGNOSIS — Z8742 Personal history of other diseases of the female genital tract: Secondary | ICD-10-CM | POA: Diagnosis not present

## 2014-08-07 DIAGNOSIS — R1310 Dysphagia, unspecified: Secondary | ICD-10-CM | POA: Diagnosis not present

## 2014-08-07 DIAGNOSIS — Z87440 Personal history of urinary (tract) infections: Secondary | ICD-10-CM | POA: Insufficient documentation

## 2014-08-07 DIAGNOSIS — Z87448 Personal history of other diseases of urinary system: Secondary | ICD-10-CM | POA: Diagnosis not present

## 2014-08-07 DIAGNOSIS — Z8639 Personal history of other endocrine, nutritional and metabolic disease: Secondary | ICD-10-CM | POA: Insufficient documentation

## 2014-08-07 DIAGNOSIS — Z862 Personal history of diseases of the blood and blood-forming organs and certain disorders involving the immune mechanism: Secondary | ICD-10-CM | POA: Insufficient documentation

## 2014-08-07 LAB — BASIC METABOLIC PANEL
ANION GAP: 10 (ref 5–15)
BUN: 12 mg/dL (ref 6–23)
CO2: 32 mEq/L (ref 19–32)
CREATININE: 1.02 mg/dL (ref 0.50–1.10)
Calcium: 9.6 mg/dL (ref 8.4–10.5)
Chloride: 94 mEq/L — ABNORMAL LOW (ref 96–112)
GFR calc Af Amer: 55 mL/min — ABNORMAL LOW (ref >90)
GFR, EST NON AFRICAN AMERICAN: 47 mL/min — AB (ref >90)
Glucose, Bld: 101 mg/dL — ABNORMAL HIGH (ref 70–99)
Potassium: 4 mEq/L (ref 3.7–5.3)
SODIUM: 136 meq/L — AB (ref 137–147)

## 2014-08-07 LAB — CBC
HCT: 40.4 % (ref 36.0–46.0)
HEMOGLOBIN: 13.7 g/dL (ref 12.0–15.0)
MCH: 30.1 pg (ref 26.0–34.0)
MCHC: 33.9 g/dL (ref 30.0–36.0)
MCV: 88.8 fL (ref 78.0–100.0)
Platelets: 195 10*3/uL (ref 150–400)
RBC: 4.55 MIL/uL (ref 3.87–5.11)
RDW: 12.8 % (ref 11.5–15.5)
WBC: 5.3 10*3/uL (ref 4.0–10.5)

## 2014-08-07 MED ORDER — SODIUM CHLORIDE 0.9 % IV BOLUS (SEPSIS)
1000.0000 mL | Freq: Once | INTRAVENOUS | Status: AC
  Administered 2014-08-07: 1000 mL via INTRAVENOUS

## 2014-08-07 NOTE — ED Notes (Signed)
Pt arrived to the ED with a complaint of dysphagia.  Pt has a history of same.  Pt states this happen around lunch when she ate dinner.  Pt has had a excitable day and believes it contributed to her symptoms

## 2014-08-07 NOTE — ED Provider Notes (Signed)
CSN: [id number]     Arrival date & time 08/07/14  1936 History   First MD Initiated Contact with Patient 08/07/14 2036     Chief Complaint  Patient presents with  . Dysphagia      HPI Patient brought to the emergency department because she continues to have solid foods become lodged in her esophagus.  She states she feels better at this time.  She states when this occurs it makes her feel short of breath and anxious.  This is been going on for some time.  She had a upper GI series with barium performed in June 2015 which demonstrated moderate esophageal dysmotility.  This was never fully explained to her.  She is scheduled to see speech therapy at the end of the month for additional testing.  She states she can drink water and thin soups without any difficulty.  She can drink water at the bedside now without any significant issues.  She states things that are more solid including breads will become lodged.  No fevers or chills.  No chest pain.   Past Medical History  Diagnosis Date  . Shortness of breath   . Unspecified essential hypertension   . Right bundle branch block   . Other and unspecified hyperlipidemia   . Other abnormal glucose   . Diverticulosis of colon (without mention of hemorrhage)   . Pyelonephritis, unspecified   . Solitary cyst of breast   . Osteoarthrosis, unspecified whether generalized or localized, unspecified site   . Lumbago   . Spinal stenosis, unspecified region other than cervical   . Anemia, unspecified   . GERD (gastroesophageal reflux disease)   . Pyelonephritis   . DJD (degenerative joint disease)   . UTI (lower urinary tract infection)    Past Surgical History  Procedure Laterality Date  . Cataract extraction    . Right hip hemiarthroplasty      total  . Conversion to right thr     Family History  Problem Relation Age of Onset  . Cancer Brother   . Cancer Brother    History  Substance Use Topics  . Smoking status: Never Smoker   .  Smokeless tobacco: Never Used  . Alcohol Use: No   OB History   Grav Para Term Preterm Abortions TAB SAB Ect Mult Living                 Review of Systems  All other systems reviewed and are negative.     Allergies  Azithromycin; Ciprofloxacin; Levofloxacin; and Latex  Home Medications   Prior to Admission medications   Medication Sig Start Date End Date Taking? Authorizing Provider  ADVAIR DISKUS 100-50 MCG/DOSE AEPB Inhale 1 puff into the lungs as needed. 08/07/14  Yes Historical Provider, MD  albuterol (PROVENTIL HFA;VENTOLIN HFA) 108 (90 BASE) MCG/ACT inhaler Inhale 2 puffs into the lungs every 6 (six) hours as needed for wheezing or shortness of breath.   Yes Historical Provider, MD  Alum & Mag Hydroxide-Simeth (MAGIC MOUTHWASH W/LIDOCAINE) SOLN Take 5 mLs by mouth 3 (three) times daily as needed for mouth pain.   Yes Historical Provider, MD  amLODipine (NORVASC) 5 MG tablet Take 1 tablet (5 mg total) by mouth daily. 06/15/14  Yes Michele Mcalpine, MD  aspirin 81 MG tablet Take 81 mg by mouth daily.    Yes Historical Provider, MD  Ca Carbonate-Mag Hydroxide (ROLAIDS PO) Take 1-2 tablets by mouth as needed.   Yes Historical Provider, MD  clonazePAM (KLONOPIN) 0.5 MG tablet Take 0.5 mg by mouth at bedtime as needed for anxiety.   Yes Historical Provider, MD  esomeprazole (NEXIUM) 20 MG capsule Take 40 mg by mouth daily at 12 noon.   Yes Historical Provider, MD  ferrous sulfate 325 (65 FE) MG tablet Take 325 mg by mouth daily with breakfast.   Yes Historical Provider, MD  hydrochlorothiazide (HYDRODIURIL) 12.5 MG tablet Take 1 tablet (12.5 mg total) by mouth daily. 06/15/14  Yes Michele Mcalpine, MD  Menthol, Topical Analgesic, (BIOFREEZE) 4 % GEL Apply 1 application topically daily as needed (pain). As needed   Yes Historical Provider, MD  Misc Natural Products (OSTEO BI-FLEX ADV DOUBLE ST PO) Take 1 tablet by mouth daily.    Yes Historical Provider, MD  Multiple Vitamin (MULTIVITAMIN WITH  MINERALS) TABS tablet Take 1 tablet by mouth daily. Centrum Silver   Yes Historical Provider, MD  polyethylene glycol (MIRALAX / GLYCOLAX) packet Take 17 g by mouth daily as needed for mild constipation.    Yes Historical Provider, MD  triamcinolone cream (KENALOG) 0.1 % Apply 1 application topically daily. For back area 08/06/14  Yes Historical Provider, MD   BP 128/75  Pulse 76  Temp(Src) 98.2 F (36.8 C) (Oral)  Resp 18  Ht  (1.702 m)  Wt 169 lb (76.658 kg)  BMI 26.46 kg/m2  SpO2 96% Physical Exam  Nursing note and vitals reviewed. Constitutional: She is oriented to person, place, and time. She appears well-developed and well-nourished. No distress.  HENT:  Head: Normocephalic and atraumatic.  Eyes: EOM are normal.  Neck: Normal range of motion.  Cardiovascular: Normal rate, regular rhythm and normal heart sounds.   Pulmonary/Chest: Effort normal and breath sounds normal.  Abdominal: Soft. She exhibits no distension. There is no tenderness.  Musculoskeletal: Normal range of motion.  Neurological: She is alert and oriented to person, place, and time.  Skin: Skin is warm and dry.  Psychiatric: She has a normal mood and affect. Judgment normal.    ED Course  Procedures (including critical care time) Labs Review Labs Reviewed  BASIC METABOLIC PANEL - Abnormal; Notable for the following:    Sodium 136 (*)    Chloride 94 (*)    Glucose, Bld 101 (*)    GFR calc non Af Amer 47 (*)    GFR calc Af Amer 55 (*)    All other components within normal limits  CBC    Imaging Review EXAM: 04/29/2014 UPPER GI SERIES WITH KUB  TECHNIQUE:  After obtaining a scout radiograph a routine upper GI series was  performed using thin and thick barium.  FLUOROSCOPY TIME: 3 min and 30 seconds  COMPARISON: abdominal ultrasound of 04/15/2014  FINDINGS:  Preprocedure scout film demonstrates a nonobstructive bowel gas  pattern. Minimal convex left lumbar spine curvature.  Double contrast  evaluation of the esophagus demonstrates no mucosal  abnormality.  Double contrast evaluation of the stomach demonstrates no evidence  of ulcer, mass, or mucosal abnormality.  Evaluation of primary peristalsis demonstrates contrast stasis  throughout the thoracic esophagus. Moderate in severity. Incomplete  primary peristaltic wave.  Full column evaluation of the esophagus demonstrates no persistent  narrowing or stricture.  IMPRESSION:  Moderate esophageal dysmotility, likely presbyesophagus.    EKG Interpretation   Date/Time:  Saturday August 07 2014 20:43:42 EDT Ventricular Rate:  65 PR Interval:  321 QRS Duration: 126 QT Interval:  459 QTC Calculation: 477 R Axis:   -61 Text Interpretation:  Sinus rhythm Prolonged PR interval Probable left  atrial enlargement Right bundle branch block Baseline wander in lead(s) V1  No significant change was found Confirmed by Charlee Whitebread  MD, Caryn Bee ([id number]) on  08/07/2014 8:47:03 PM      MDM   Final diagnoses:  Dysphagia    Patient is tolerating fluids in the emergency department.  She was bolused IV fluids.  Labs are without significant abnormality.  I do not think she has a food impaction this time.  She'll need to follow up as an outpatient with her primary care Dr. and her gastroenterologist.  She may benefit from endoscopy.  She is scheduled to see speech therapy.  Until then she will remain on clear fluids and brothy soups.    Lyanne Co, MD 08/07/14 [phone]

## 2014-08-07 NOTE — ED Notes (Signed)
Pt brought to ED by daughter for reoccurring and worsening difficulty swallowing with [facility], originally began 1 year ago. Pt states she feels like food is stuck going down. Pt is handling secretions.

## 2014-08-09 ENCOUNTER — Ambulatory Visit: Payer: Medicare Other | Attending: Pulmonary Disease | Admitting: Physical Therapy

## 2014-08-09 ENCOUNTER — Telehealth: Payer: Self-pay | Admitting: Pulmonary Disease

## 2014-08-09 DIAGNOSIS — R1319 Other dysphagia: Secondary | ICD-10-CM

## 2014-08-09 DIAGNOSIS — IMO0001 Reserved for inherently not codable concepts without codable children: Secondary | ICD-10-CM | POA: Insufficient documentation

## 2014-08-09 DIAGNOSIS — R269 Unspecified abnormalities of gait and mobility: Secondary | ICD-10-CM | POA: Diagnosis not present

## 2014-08-09 NOTE — Telephone Encounter (Signed)
Moved this appt to [facility] 08/13/14@11 :00am lmtcb Tobe Sos

## 2014-08-09 NOTE — Telephone Encounter (Signed)
Per SN---  We can get 2nd opinion with GI.  Refer to Dr. Randa Evens, magod, , etc for eval of dyphagia.  Set up for MBS with speech pathology ASAP.  thanks

## 2014-08-09 NOTE — Telephone Encounter (Signed)
Spoke to shirley she is aware of new appt and they will be called about the GI appt Tobe Sos

## 2014-08-09 NOTE — Telephone Encounter (Signed)
I spoke with the pt's daughter and notified of recs per SN She verbalized understanding  She states that the pt is already scheduled for swallowing study for next wk  SN wants this done STAT- so I d/w [facility] and will send them this msg to see if appt can be scheduled sooner  Order placed for GI ref  Please advise PCC's thanks!

## 2014-08-09 NOTE — Telephone Encounter (Signed)
I spoke with the pt daughter and she states on Saturday she had to take the pt to the ER due to SOB and food getting stuck in her throat. She states the issue the pt has been having with food getting stuck has continued to get much worse. She states every time she eats she burps and belches a lot after. She states no matter what she eats this happens. She states now everything she eats feels like it gets stuck and the pt feels like she cannot breathe. She is taking all medications as directed. She states Dr. Marina Goodell did a procedure recently and they are not happy with his care, so that is why they have not called him. Pt daughter wants to know what to do from here. She is very concerned about the pt and wants to prevent this from happening. Please advise. Carron Curie, CMA

## 2014-08-13 ENCOUNTER — Ambulatory Visit (HOSPITAL_COMMUNITY)
Admission: RE | Admit: 2014-08-13 | Discharge: 2014-08-13 | Disposition: A | Discharge status: Home / Self Care | Payer: Medicare Other | Source: Ambulatory Visit | Attending: Pulmonary Disease | Admitting: Pulmonary Disease

## 2014-08-13 DIAGNOSIS — R1310 Dysphagia, unspecified: Secondary | ICD-10-CM | POA: Insufficient documentation

## 2014-08-13 DIAGNOSIS — K219 Gastro-esophageal reflux disease without esophagitis: Secondary | ICD-10-CM | POA: Diagnosis not present

## 2014-08-13 DIAGNOSIS — K5730 Diverticulosis of large intestine without perforation or abscess without bleeding: Secondary | ICD-10-CM | POA: Insufficient documentation

## 2014-08-13 DIAGNOSIS — M1990 Unspecified osteoarthritis, unspecified site: Secondary | ICD-10-CM | POA: Diagnosis not present

## 2014-08-13 DIAGNOSIS — K2289 Other specified disease of esophagus: Secondary | ICD-10-CM | POA: Diagnosis not present

## 2014-08-13 DIAGNOSIS — D649 Anemia, unspecified: Secondary | ICD-10-CM | POA: Insufficient documentation

## 2014-08-13 DIAGNOSIS — K228 Other specified diseases of esophagus: Secondary | ICD-10-CM | POA: Insufficient documentation

## 2014-08-13 DIAGNOSIS — I4510 Unspecified right bundle-branch block: Secondary | ICD-10-CM | POA: Diagnosis not present

## 2014-08-13 DIAGNOSIS — E785 Hyperlipidemia, unspecified: Secondary | ICD-10-CM | POA: Diagnosis not present

## 2014-08-13 DIAGNOSIS — I10 Essential (primary) hypertension: Secondary | ICD-10-CM | POA: Diagnosis not present

## 2014-08-13 DIAGNOSIS — R1319 Other dysphagia: Secondary | ICD-10-CM | POA: Insufficient documentation

## 2014-08-13 DIAGNOSIS — M4800 Spinal stenosis, site unspecified: Secondary | ICD-10-CM | POA: Insufficient documentation

## 2014-08-13 NOTE — Procedures (Signed)
Objective Swallowing Evaluation: Modified Barium Swallowing Study  Patient Details  Name: ETSUKO DIEROLF MRN: [id number] Date of Birth: 09-Aug-1923  Today's Date: 08/13/2014 Time: 4098-1191 SLP Time Calculation (min): 30 min  Past Medical History:  Past Medical History  Diagnosis Date  . Shortness of breath   . Unspecified essential hypertension   . Right bundle branch block   . Other and unspecified hyperlipidemia   . Other abnormal glucose   . Diverticulosis of colon (without mention of hemorrhage)   . Pyelonephritis, unspecified   . Solitary cyst of breast   . Osteoarthrosis, unspecified whether generalized or localized, unspecified site   . Lumbago   . Spinal stenosis, unspecified region other than cervical   . Anemia, unspecified   . GERD (gastroesophageal reflux disease)   . Pyelonephritis   . DJD (degenerative joint disease)   . UTI (lower urinary tract infection)    Past Surgical History:  Past Surgical History  Procedure Laterality Date  . Cataract extraction    . Right hip hemiarthroplasty      total  . Conversion to right thr     HPI:  Patient is an 80 year old arriving for an outpatient MBS. She was admitted to ED on 9/19 because she continues to have solid foods become lodged in her esophagus.  She stated when this occurs it makes her feel short of breath and anxious. She had a upper GI series with barium performed in June 2015 which demonstrated moderate esophageal dysmotility, likely presbyesophagus per report. She states she can drink water and thin soups without any difficulty.  She states things that are more solid including breads will become lodged. Dtr reprots pts nutritional intake has been poor.      Assessment / Plan / Recommendation Clinical Impression  Dysphagia Diagnosis: Mild cervical esophageal phase dysphagia;Suspected primary esophageal dysphagia Clinical impression: Pt presents with a mild cervical esophageal dysphagia due to prominent  cricopharyngeus (confirmed by radiologist) with trace residuals above CP. Oral and oropharyngeal phase within normal limits with no penetration or aspiration during study. Esophageal sweep did reveal stasis in mid to distal esophagus with solids, which cleared with sips of thin liquids. Radiologist present confirmed abnormal peristalsis previously diagnosed on prior UGI. SLP provided written and verbal esophageal compensatory strategies to aid in clearance.  Risk of aspiration is low, pt may continue regular diet with thin liquids with precautions.  Pt with plans to f/u with GI soon and will discuss results at that time.     Treatment Recommendation  No treatment recommended at this time    Diet Recommendation Regular;Thin liquid   Liquid Administration via: Cup;Straw Medication Administration: Whole meds with liquid Supervision: Patient able to self feed Compensations: Slow rate;Small sips/bites;Follow solids with liquid Postural Changes and/or Swallow Maneuvers: Seated upright 90 degrees;Upright 30-60 min after meal    Other  Recommendations Recommended Consults: Consider GI evaluation Oral Care Recommendations: Patient independent with oral care   Follow Up Recommendations  None    Frequency and Duration        Pertinent Vitals/Pain NA    SLP Swallow Goals     General HPI: Patient is an 80 year old arriving for an outpatient MBS. She was admitted to ED on 9/19 because she continues to have solid foods become lodged in her esophagus.  She stated when this occurs it makes her feel short of breath and anxious. She had a upper GI series with barium performed in June 2015 which demonstrated moderate  esophageal dysmotility, likely presbyesophagus per report. She states she can drink water and thin soups without any difficulty.  She states things that are more solid including breads will become lodged. Dtr reprots pts nutritional intake has been poor.  Type of Study: Modified Barium  Swallowing Study Previous Swallow Assessment: Upper GI Diet Prior to this Study: Regular;Thin liquids Temperature Spikes Noted: N/A Respiratory Status: Room air History of Recent Intubation: No Behavior/Cognition: Alert;Cooperative;Pleasant mood Oral Cavity - Dentition: Dentures, top;Dentures, bottom Oral Motor / Sensory Function: Within functional limits Self-Feeding Abilities: Able to feed self Patient Positioning: Upright in chair Baseline Vocal Quality: Clear Volitional Cough: Strong Volitional Swallow: Able to elicit Anatomy: Within functional limits Pharyngeal Secretions: Normal    Reason for Referral     Oral Phase Oral Preparation/Oral Phase Oral Phase: WFL (Pt could not orally transit large pill)   Pharyngeal Phase Pharyngeal Phase Pharyngeal Phase: Within functional limits Pharyngeal - Thin Pharyngeal - Thin Cup: Within functional limits Pharyngeal - Thin Straw: Within functional limits Pharyngeal - Solids Pharyngeal - Puree: Within functional limits Pharyngeal - Regular: Within functional limits Pharyngeal - Pill: Not tested (pt could not orally transit with liquids or solids)  Cervical Esophageal Phase    GO    Cervical Esophageal Phase Cervical Esophageal Phase: Impaired Cervical Esophageal Phase - Comment Cervical Esophageal Comment: Prominent cricopharyngeus, trace residuals above CP with all textures    Functional Assessment Tool Used: clinical judgement Functional Limitations: Swallowing Swallow Current Status ([id number]): At least 20 percent but less than 40 percent impaired, limited or restricted Swallow Goal Status [id number]): At least 20 percent but less than 40 percent impaired, limited or restricted Swallow Discharge Status [phone]): At least 20 percent but less than 40 percent impaired, limited or restricted    Gurnoor Sloop, Riley Nearing 08/13/2014, 11:42 AM

## 2014-08-16 ENCOUNTER — Ambulatory Visit: Payer: Medicare Other | Admitting: Pulmonary Disease

## 2014-08-17 ENCOUNTER — Ambulatory Visit (HOSPITAL_COMMUNITY): Payer: Medicare Other

## 2014-08-17 ENCOUNTER — Ambulatory Visit: Payer: Medicare Other | Admitting: Physical Therapy

## 2014-08-17 ENCOUNTER — Other Ambulatory Visit (HOSPITAL_COMMUNITY): Payer: Medicare Other

## 2014-08-17 DIAGNOSIS — IMO0001 Reserved for inherently not codable concepts without codable children: Secondary | ICD-10-CM | POA: Diagnosis not present

## 2014-08-19 ENCOUNTER — Ambulatory Visit: Payer: Medicare Other | Attending: Pulmonary Disease | Admitting: Physical Therapy

## 2014-08-19 ENCOUNTER — Encounter: Payer: Medicare Other | Admitting: Physical Therapy

## 2014-08-19 DIAGNOSIS — R1310 Dysphagia, unspecified: Secondary | ICD-10-CM | POA: Diagnosis not present

## 2014-08-19 DIAGNOSIS — R269 Unspecified abnormalities of gait and mobility: Secondary | ICD-10-CM | POA: Insufficient documentation

## 2014-08-19 DIAGNOSIS — Z96641 Presence of right artificial hip joint: Secondary | ICD-10-CM | POA: Insufficient documentation

## 2014-08-19 DIAGNOSIS — R531 Weakness: Secondary | ICD-10-CM | POA: Insufficient documentation

## 2014-08-22 ENCOUNTER — Other Ambulatory Visit: Payer: Self-pay | Admitting: Pulmonary Disease

## 2014-08-23 ENCOUNTER — Ambulatory Visit: Payer: Medicare Other | Admitting: Pulmonary Disease

## 2014-08-23 ENCOUNTER — Ambulatory Visit: Payer: Medicare Other | Admitting: Physical Therapy

## 2014-08-23 DIAGNOSIS — R269 Unspecified abnormalities of gait and mobility: Secondary | ICD-10-CM | POA: Diagnosis not present

## 2014-08-24 ENCOUNTER — Encounter: Payer: Medicare Other | Admitting: Physical Therapy

## 2014-08-31 ENCOUNTER — Ambulatory Visit: Payer: Medicare Other | Admitting: Physical Therapy

## 2014-08-31 DIAGNOSIS — R269 Unspecified abnormalities of gait and mobility: Secondary | ICD-10-CM | POA: Diagnosis not present

## 2014-09-02 ENCOUNTER — Ambulatory Visit: Payer: Medicare Other | Admitting: Physical Therapy

## 2014-09-02 DIAGNOSIS — R269 Unspecified abnormalities of gait and mobility: Secondary | ICD-10-CM | POA: Diagnosis not present

## 2014-09-07 ENCOUNTER — Ambulatory Visit: Payer: Medicare Other | Admitting: Physical Therapy

## 2014-09-07 DIAGNOSIS — R269 Unspecified abnormalities of gait and mobility: Secondary | ICD-10-CM | POA: Diagnosis not present

## 2014-09-09 ENCOUNTER — Ambulatory Visit: Payer: Medicare Other | Admitting: Physical Therapy

## 2014-09-09 DIAGNOSIS — R269 Unspecified abnormalities of gait and mobility: Secondary | ICD-10-CM | POA: Diagnosis not present

## 2014-09-14 ENCOUNTER — Other Ambulatory Visit: Payer: Self-pay | Admitting: Dermatology

## 2014-09-14 ENCOUNTER — Ambulatory Visit: Payer: Medicare Other | Admitting: Physical Therapy

## 2014-09-17 ENCOUNTER — Telehealth: Payer: Self-pay | Admitting: Pulmonary Disease

## 2014-09-17 NOTE — Telephone Encounter (Signed)
Called spoke with daughter. She wanted to confirm which medications pt is allergic too. I advised her of what is in EPIC. Allergy list has been printed off as well and placed for pick up. Nothing further needed

## 2014-09-21 ENCOUNTER — Ambulatory Visit: Payer: Medicare Other | Admitting: Physical Therapy

## 2014-09-23 ENCOUNTER — Ambulatory Visit: Payer: Medicare Other | Attending: Pulmonary Disease | Admitting: Physical Therapy

## 2014-09-23 ENCOUNTER — Encounter: Payer: Self-pay | Admitting: Physical Therapy

## 2014-09-23 DIAGNOSIS — R269 Unspecified abnormalities of gait and mobility: Secondary | ICD-10-CM | POA: Diagnosis not present

## 2014-09-23 NOTE — Therapy (Signed)
Physical Therapy Treatment  Patient Details  Name: Gwendolyn DolphinMargaret J [name] MRN: [id number] Date of Birth: 25-Nov-1922  Encounter Date: 09/23/2014      PT End of Session - 09/23/14 1156    Visit Number 9   Number of Visits 17   Date for PT Re-Evaluation 10/08/14   PT Start Time 1103   PT Stop Time 1150   PT Time Calculation (min) 47 min      Past Medical History  Diagnosis Date  . Shortness of breath   . Unspecified essential hypertension   . Right bundle branch block   . Other and unspecified hyperlipidemia   . Other abnormal glucose   . Diverticulosis of colon (without mention of hemorrhage)   . Pyelonephritis, unspecified   . Solitary cyst of breast   . Osteoarthrosis, unspecified whether generalized or localized, unspecified site   . Lumbago   . Spinal stenosis, unspecified region other than cervical   . Anemia, unspecified   . GERD (gastroesophageal reflux disease)   . Pyelonephritis   . DJD (degenerative joint disease)   . UTI (lower urinary tract infection)     Past Surgical History  Procedure Laterality Date  . Cataract extraction    . Right hip hemiarthroplasty      total  . Conversion to right thr      There were no vitals taken for this visit.  Visit Diagnosis:  Abnormality of gait          [facility] Adult PT Treatment/Exercise - 09/23/14 1117    High Level Balance   High Level Balance Activities Other (comment)   High Level Balance Comments sit to stand from mat onto foam surface x 10 reps;cone taps and tipping/uprighting with min UE assist of cane   Knee/Hip Exercises: Aerobic   Stationary Bike Scifit level 2.0 al 4 extremites for 5 minutes   Knee/Hip Exercises: Standing   Other Standing Knee Exercises bil LE standing hip flexion, hip abd, hip extension, hamstring curl-all 10 reps   Knee/Hip Exercises: Seated   Long Arc Quad AROM;Strengthening;Both;2 sets;10 reps   Long Arc Quad Weight 3 lbs.   Other Seated Knee Exercises --  seated bil hip flexion  x 10 x 2 with 3lb weight              PT Long Term Goals - 09/23/14 1201    PT LONG TERM GOAL #1   Title verbalize understanding of fall prevention strategies within home environment   Time --  10/08/14   Status On-going   PT LONG TERM GOAL #2   Title Increase Berf balance test score to >=47/56 to decrease fall risk   Time --  10/08/14   Status On-going   PT LONG TERM GOAL #3   Title Improve Timed Up and Go score to <=16.0 seconds with can for reduced fall risk   Time --  10/08/14   Status On-going   PT LONG TERM GOAL #4   Title Increase gait velocity to >=2.3 ft/sec with can for increased gait efficiency   Time --  10/08/14   Status On-going   PT LONG TERM GOAL #5   Title Perform sit to stand x 1 rep without UE support from high/low mat table to demo incr. lower extremity   Additional Long Term Goals   Additional Long Term Goals Yes   PT LONG TERM GOAL #6   Title Improve FOTO to >=53   Time --  10/08/14   Status On-going  Plan - 09/23/14 1158    Clinical Impression Statement Pt fearful of falling and hesitant to try dynamic activities.   Pt will benefit from skilled therapeutic intervention in order to improve on the following deficits Abnormal gait;Decreased balance;Decreased strength;Difficulty walking   PT Treatment/Interventions Gait training;Therapeutic exercise;Therapeutic activities;Patient/family education;Functional mobility training;Neuromuscular re-education;Balance training   PT Plan Dynamic balance activities, strengthening, gait        Problem List Patient Active Problem List   Diagnosis Date Noted  . Chronic venous insufficiency 04/14/2014  . Abnormality of gait 08/17/2013  . Acute URI 07/29/2012  . GERD (gastroesophageal reflux disease) 09/12/2011  . Neck pain 06/18/2011  . Anxiety 05/14/2011  . Acute bronchitis 05/02/2011  . ONYCHOMYCOSIS 12/13/2008  . UTI 12/13/2008  . DERMATITIS, ALLERGIC 12/13/2008  . EDEMA 10/21/2008   . DIVERTICULOSIS OF COLON 12/01/2007  . CONSTIPATION 12/01/2007  . PYELONEPHRITIS 12/01/2007  . BREAST CYST 12/01/2007  . DEGENERATIVE JOINT DISEASE 12/01/2007  . DYSPNEA 12/01/2007  . SPINAL STENOSIS 10/28/2007  . HYPERLIPIDEMIA 10/27/2007  . ANEMIA 10/27/2007  . HYPERTENSION 10/27/2007  . RIGHT BUNDLE BRANCH BLOCK 10/27/2007  . LOW BACK PAIN SYNDROME 10/27/2007  . DIABETES MELLITUS, BORDERLINE 10/27/2007       Newell Coralobertson, [name] 09/23/2014, 2:02 PM

## 2014-09-28 ENCOUNTER — Other Ambulatory Visit: Payer: Medicare Other

## 2014-09-28 ENCOUNTER — Encounter: Payer: Self-pay | Admitting: Physical Therapy

## 2014-09-28 ENCOUNTER — Ambulatory Visit (INDEPENDENT_AMBULATORY_CARE_PROVIDER_SITE_OTHER): Payer: Medicare Other | Admitting: Pulmonary Disease

## 2014-09-28 ENCOUNTER — Encounter: Payer: Self-pay | Admitting: Pulmonary Disease

## 2014-09-28 ENCOUNTER — Ambulatory Visit: Payer: Medicare Other | Admitting: Physical Therapy

## 2014-09-28 VITALS — BP 140/70 | HR 112 | Temp 98.6°F | Ht 68.0 in | Wt 170.2 lb

## 2014-09-28 DIAGNOSIS — I4510 Unspecified right bundle-branch block: Secondary | ICD-10-CM

## 2014-09-28 DIAGNOSIS — R269 Unspecified abnormalities of gait and mobility: Secondary | ICD-10-CM

## 2014-09-28 DIAGNOSIS — I872 Venous insufficiency (chronic) (peripheral): Secondary | ICD-10-CM

## 2014-09-28 DIAGNOSIS — F419 Anxiety disorder, unspecified: Secondary | ICD-10-CM

## 2014-09-28 DIAGNOSIS — M48061 Spinal stenosis, lumbar region without neurogenic claudication: Secondary | ICD-10-CM

## 2014-09-28 DIAGNOSIS — R0602 Shortness of breath: Secondary | ICD-10-CM

## 2014-09-28 DIAGNOSIS — M159 Polyosteoarthritis, unspecified: Secondary | ICD-10-CM

## 2014-09-28 DIAGNOSIS — K219 Gastro-esophageal reflux disease without esophagitis: Secondary | ICD-10-CM

## 2014-09-28 DIAGNOSIS — M4806 Spinal stenosis, lumbar region: Secondary | ICD-10-CM

## 2014-09-28 DIAGNOSIS — R609 Edema, unspecified: Secondary | ICD-10-CM

## 2014-09-28 DIAGNOSIS — M150 Primary generalized (osteo)arthritis: Secondary | ICD-10-CM

## 2014-09-28 DIAGNOSIS — I10 Essential (primary) hypertension: Secondary | ICD-10-CM

## 2014-09-28 MED ORDER — HYDROXYZINE HCL 10 MG PO TABS: 10.0000 mg | ORAL_TABLET | Freq: Four times a day (QID) | ORAL | Status: DC | PRN

## 2014-09-28 NOTE — Patient Instructions (Signed)
Today we updated your med list in our EPIC system...    Continue your current medications the same...  We added ATARAX (Hydroxyzine) 10 mg to take every 6h as needed for itching...  We will check a Tb blood test called the qunatiferon Gold assay & send a copy to [facility]...    We will contact you w/ the results when available...   Call for any questions...  Let's plan a follow up visit in 83mo, sooner if needed for problems.[name]..[name]

## 2014-09-28 NOTE — Progress Notes (Signed)
Subjective:    Patient ID: [name], female    DOB: [birth year], 80 y.o.   MRN: [id number]  HPI 80 y/o WF here for a follow up visit... she has multiple medical problems as noted below...  Followed for general medical purposes w/ hx chr obstructive asthma, HBP, RBBB, Hypercholesterolemia, borderline DM, DJD, LBP w/ sp stenosis, etc... ~  SEE PREV EPIC NOTES FOR THE OLDER DATA >>   ~  February 10, 2013:  70moROV & [name]'s CC is pain & numbness in her legs- very uncomfortable, knees down, feet burning, & feel cold most of the time, awaiting shot in her back from DrRamos; we discussed trial Lyrica59min the interim... We reviewed the following medical problems during today's office visit >>     Chr Obstructive Asthma> stable on Advair100 & Proventil prn; notes breathing at baseline & Prn Klonopin helps dyspnea...    HBP> on Norvasc10, HCTZ25; BP=136/60, tol meds well; denies CP, palpit, ch in SOB, edema, etc...    CHOL> on diet alone, refuses meds, FLP 3/14 shows TChol 177, TG 50, HDL 60, LDL 107    DM> on diet alone, wt stable ~174#, BS=97, last A1c (2/13) was 6.3 & she knows to restrict carbs etc...    GI- Reflux, Divertics, constip> on Protonix40, Miralax, Senakot-S; continue same meds.. [name]  DJD/ LBP> on Celebrex & osteobiflex prn; had right THR 2011; known sp stenosis w/ prev ESI... We reviewed prob list, meds, xrays and labs> see below for updates >>   CXR 2/14 showed normal heart size, clear lungs w/ sl peribronch thickening, DJD in spine, NAD...  LABS 3/14:  FLP- at goals on diet x LDL=107;  Chems- ok x TCO2=36;  CBC- wnl;  TSH=1.93;  UA- clear...  ~  August 17, 2013:  79m979moV & [name] continues to do well at 88;23o new complaints or concerns...    COPD stable on Advair100 & only using it prn she says; no recent resp exac & denies cough, sput, hemoptysis, SOB, CP, etc...    BP controlled on Amlod5 & Hct25-1/2 daily; BP= 130/70 & she denies CP, palpit, dizzy, SOB, edema,  etc...     Chol & BS controlled on diet alone; weight is down 4# to 169# today...    GI is stable on Protonix40, antiireflux regimen, & Miralax...     DJD/ LBP treated w/ Vits, Osteobiflex, Celebrex200 prn & Neurontin100 prn- this is the way she likes it! We reviewed prob list, meds, xrays and labs> see below for updates >> ok 2014 Flu vaccine today...  ~  February 15, 2014:  79mo48mo & [name] recently saw TP w/ incr SOB/DOE walking & Qhs, swelling in legs, etc; CXR & Labs were ok; she changed her Hct to Lasix & improved... We reviewed the following medical problems during today's office visit >>     Chr Obstructive Asthma> stable on Advair100 (but only using it prn) & Proventil prn; notes breathing back to baseline & Prn Klonopin helps dyspnea...    HBP> on ASA81, Norvasc5, Lasix20- 1-2/d; BP=136/70, tol meds well; denies CP, palpit, ch in SOB, but notes persist edema in legs; Rec to ch Amlod5 to Losar50, low sodium, elev legs, Lasix40...    CHOL> on diet alone, refuses meds, FLP 3/14 shows TChol 177, TG 50, HDL 60, LDL 107    DM> on diet alone, wt up slightly to  ~179#, BS=95, last A1c (2/13) was 6.3 & she knows to restrict  carbs etc...    GI- Reflux, Divertics, constip> on Protonix40, Miralax, Senakot-S; continue same meds.[name]    DJD/ LBP> on Celebrex & osteobiflex prn; had right THR 2011; known sp stenosis w/ prev ESI... We reviewed prob list, meds, xrays and labs> see below for updates >>   CXR 3/15 showed norm heart size, clear lungs, elev of right hemidiaph, NAD...  LABS 3/15:  Chems- wnl;  CBC- wnl;  BNP=26...  ~  Apr 14, 2014:  77moROV & add-on appt requested for "indigestion"> difficult hx but pt appears to be c/o swallowing difficulty, dysphagia, heartburn, & indigestion according to the daugh; she's had gas, belching, hoarseness, SOB, and early satiety; also notes constipation & took mag citrate for relief;  Epic review indicates hx indigestion & reflux symptoms- treated by DrPerry w/ PPI, she  had EGD & Colon 2002;  Notes food sticking in mid-esoph w/ pain, nausea despite her Prevacid30 qam...  We decided to increase the PPI- Prevacid30Bid and refer to DrPerry for EGD and dilatation....    She is also c/o incr ankle edema; on Lasix20, no salt, etc;  We discussed increase diuretic to 467mam and re-double efforts at sodium restriction, elevation, support hose, etc...     BP remains stable ooff Amlod, on Losar50, Lasix40;  BP= 138/80 today & she denies angina pain, CHF, etc...  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     She also had some ankle edema while on Lasix20 and was told no salt, incr Lasix to 32m60m& elevate/ support hose/ etc... Once again daughter has changed her meds based on perceived side effects ("causing head spells- I feel my heart in my head"); they stopped the Lasix & she does not want to restart- asking to go back on prev meds= Amlod5 & HCT12.5 daily, OK...    Similarly she as Klonopin 0.5mg 16mtake 1/2 to 1 tab as needed for dyspnea or anxiety, and she tells me she hasn't even tried it one time!  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  I have again recommended that the pt try to  increase her Klonopin from 0.$RemoveBeforeD'5mg'tkNOxGQhJiZBxG$  Qhs to one Bid or perhaps try 1/2 in AM, 1/2 in afternoon, and 1 at bedtime...     Finally the daughter asked about ordering some Physical Therapy for her mother; they feel she is getting weaker esp in legs, slower, and balance is off; we reviewed the need for exercise and we will request PT consult & their involvement We reviewed prob list, meds, xrays and labs> see below for updates >> OK 2015 flu vaccine today  ~  September 28, 2014:  55mo ROV & Camerin says "I'm not doing well"; c/o skin rash- daughter thinks it's the EKG tabs (but she has rash on lowe back/ buttock), states "allergic to elastic", she has seen Derm- DrDJones on 3 diff creams (currently on clobetasol0.05% & Eurcin calming lotion); they did Bx= "looked ok, it was neg";  He is apparently considering an immunosuppressant med 7 want her checked for Tb- we will send Quantiferon Gold...     She saw DrJEdwards for GI> nothing avail in EPIC yet but daughter states he did MBS& he rec warm liquids w/ her meals & she is 90% better decr burbs, decr gas, swallowing better; pt stopped Nexium on her own & refuses restart...    On Advair100, AlbutHFA, MMW> all being used just prn & she states breathing is 90% better...    On Amlod5 7 HCT12.5; BP= 140/70 w/o CP, palpit, SOB, edema...     She is getting outpt PT now- helping she says...    She is off the Klonopin 7 refuses to restart this med... We reviewed prob list,  meds, xrays and labs> see below for updates >>  PLAN>> we wrote for Atarax $RemoveBe'10mg'fKABUAmfh$  Q6h prn itching; we sent Quantiferon gold- pending...           Problem List:       DYSPNEA (ICD-786.05) - long hx of chronic obstructive asthma treated w/ ADVAIR100Bid & PROAIR (she uses them Prn now)... she is a non-smoker w/ some reactive airways disease in the past & retired from [employer] after 33 years in 1991... she denies cough, sputum, hemoptysis, worsening dyspnea, wheezing, chest pains, snoring, daytime hypersomnolence, etc...  ~  baseline CXR w/o acute changes...  ~  PFT's 5/02 w/ FVC 2.07 (68%), FEV1=1.36 (57%), and FEV1/FVC ratio=66%, mid-flows 42%... ~  CT Angio 7/10 was neg- x biapical pleuroparenchymal scarring... ~  CXR 10/11 showed sl elev right hemidaiph, mild DJD sp, osteopenia, NAD.[name]. ~  CXR 6/12 showed mild apical scarring, clear & NAD, DJD sp w/ osteophytes... ~  Intermittent dyspnea more related to anxiety & treated w/ KLONOPIN 0.$RemoveBefor'5mg'bEyXpFOYdDZt$  1/2 to 1 tab Bid==> improved. ~  CXR 4/13 showed normal heart size, clear lungs, DJD in TSpine... ~  CXR 2/14 showed normal heart size, clear lungs w/ sl peribronch thickening, DJD in spine, NAD.[name]. ~  CXR 3/15 showed norm heart size, clear lungs, elev of right hemidiaph, NAD... ~  She is encouraged to take the Klonopin 0.$RemoveBefore'5mg'ZvhtSYIDRalzM$  Bid regularly- consider taking 1/2 in AM, 1/2 in afternoon, one at bedtime...  HYPERTENSION (ICD-401.9) - controlled on NORVASC $RemoveBe'5mg'VLzprfczf$  daily & HCTZ $Remo'25mg'zPXWf$ tab daily...  ~  2/13:  BP 144/70 today> tol rx well & denies HA, visual changes, CP, palipit, dizziness, syncope, edema, etc... ~  4/13:  BP= 148/80 & she denies CP, palpit, edema; dyspnea improved w/ Klonopin. ~  6/13:  BP= 132/68 & as noted she has mult somatic complaints... ~  3/14:  on Norvasc10, HCTZ25; BP=136/60, tol meds  well; denies CP, palpit, ch in SOB, edema, etc  ~  9/14:  BP controlled on Amlod5 & Hct25-1/2 daily; BP= 130/70 & she denies CP, palpit, dizzy, SOB, edema, etc. ~   3/15: on ASA81, Norvasc5, Lasix20- 1-2/d; BP=136/70, tol meds well; denies CP, palpit, ch in SOB, but notes persist edema in legs; Rec to ch Amlod5 to Losar50, low sodium, elev legs, Lasix40. ~  5/15: on Losar50, Lasix40;  BP= 138/60 & she denies angina pain, palpit, ch in SOB, etc... ~  7/15: on Losar50, she stopped Lasix; BP= 138/78 and they want to go back on Amlod5 + HCT12.5 daily... ~  9/15: on Amlod5, Hct12.5; BP= 128/80 & she likes this combo better- denies CP, palpit, etc...  RIGHT BUNDLE BRANCH BLOCK (ICD-426.4) - on ASA $Remo'81mg'hRMuu$ /d... baseline EKG w/ RBBB and 2DEcho 5/02 showed mild asymmetric LVH w/ incr EF...  VENOUS INSUFFIC & EDEMA >>  ~  3/15: she was switched off Amlod & onto Losar50, Lasix20=>40, plus no salt, elevation, support hose, etc...  ~  9/15: they preferred the Amlod5 7 HCT12.5 regimen; she has VI & 1+edema but stable, no acute changes...  HYPERLIPIDEMIA (ICD-272.4) - on diet alone... she forgets to come to visits FASTING for this blood work ~  [place] 8/07 showed TChol 205, TG 71, HDL 49, LDL 130... ~  FLP 6/12 on diet alone showed TChol 218, TG 50, HDL 66, LDL 129 ~  FLP 2/13 on diet alone showed TChol 171, TG 43, HDL 66, LDL 97 ~  FLP 3/14 on diet alone showed TChol 177, TG 50, HDL 60, LDL 107 ~  She needs to ret FASTING for f/u FLP...  DIABETES MELLITUS, BORDERLINE (ICD-790.29) - on diet alone w/ prev BS's in the 100-160 range... ~  labs in 2008-9 showed BS= 101 to 108 ~  labs 1/10 showed BS= 106, A1c= 5.9 ~  Labs 6/12 showed BS= 93, A1c= 6.6.Marland KitchenMarland Kitchen rec diet, exercise... ~  Labs 2/13 showed BS= 92, A1c= 6.3 ~  3/14: on diet alone, wt stable ~174#, BS=97, last A1c (2/13) was 6.3 & she knows to restrict carbs etc. ~  Labs 3/15 showed BS= 95; and BS= 120 in [id number]...   INDIGESTION/ REFLUX SYMPTOMS >> see 10/12 note & PROTONIX $RemoveBe'40mg'AcVpddzCN$ /d started, further eval if symptoms persist... ESOPHAGEAL DYSMOTILITY/ PRESBYESOPHAGUS >>  ~  4/13:  She notes some reflux symptoms and excess  gas w/ belching; rec to take the Protonix daily & Simethacone vs Tums which she says helps her gas. ~  5/13:  She saw GI DrPerry w/ rec to take Prilosec for her indigestion... ~  5/15:  She presented w/ worsening indigestion, dysphagia, reflux & Prev30 was incr to Bid w/ GI f/u suggested for EGD... 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 ~  9/15:  Symptoms persist but she is not regurg or vomiting, and weight stable; they want to change to Nexium40Bid-OK, and rec proceed w/ MBS by speech path...  DIVERTICULOSIS OF COLON (ICD-562.10) - she takes SENAKOT-S, MIRALAX, Peppermint Tea, & sauerkraut Prn...last colonoscopy 9/02 by DrPerry was WNL...  PYELONEPHRITIS (ICD-590.80) - SEE 1/09 Hospitalization (reviewed)... ~  She saw DrMacDiarmid for her recurrent  UTIs, chronic cystitis, urge & stress incont, nocturia; she is INTOL to Cipro & Levaquin...  Hx of BREAST CYST (ICD-610.0)  DEGENERATIVE JOINT DISEASE (ICD-715.90) - s/p right hip hemiarthroplasty 11/09 by DrAplington w/ wound complic... then dx w/ loosening of the femoral shaft & had conversion to right THR by DrAlusio 10/11 & much improved... she uses CELEBREX $RemoveBefor'200mg'oVmjmqEkJeQt$  Prn (seldom takes this).  LOW BACK PAIN SYNDROME (ICD-724.2) & SPINAL STENOSIS (ICD-724.00) - severe LBP & spinal stenosis w/ evals by DrRamos & DrNudelman... s/p shots, considering poss surgery vs alternative therapies... she takes Celebrex, Osteobiflex, MVI, Vit D... ~  8/10: eval by DrAplington- diff leg lengths, lift placed in right shoe, then trial Lyrica$RemoveBeforeDE'50mg'etXftHnmlpFErAI$ ... ~  12/11:  improved after hip revision surg (to THR) 10/11 w/ better ambulaton... ~  5/13:  DrRamos gave her another ESI  for her leg pain related to sp stenosis... ~  3/14:  C/o neuropathic discomfort in legs- eval by Ramos & offered shots in her back; try Lyrica50 in the interim...  Hx of ANEMIA (ICD-285.9) - eval by GI in 2002 showed normal EGD and Colon... prob iron malabsorption problem Rx'd w/ Fe infusion... ~  labs 1/10 showed Hg= 14.6, MCV= 89, Fe= 94 ~  labs 12/11 showed Hg= 12.8, MCV= 90, Fe= 33... try Fe supplement + VitC... ~  Labs 6/12 showed Hg= 15.0 ~  Labs 2/13 showed Hg= 14.6 ~  Labs 2/14 showed Hg= 14.8 ~  Labs 3/15 showed Hg= 14.9  DERM:  rash Rx'd by dermatology- OLUX-E foam= clobetasol Foam 0.05%...   Past Surgical History  Procedure Laterality Date  . Cataract extraction    . Right hip hemiarthroplasty      total  . Conversion to right thr      Outpatient Encounter Prescriptions as of 09/28/2014  Medication Sig  . ADVAIR DISKUS 100-50 MCG/DOSE AEPB Inhale 1 puff into the lungs as needed.  [name] albuterol (PROVENTIL HFA;VENTOLIN HFA) 108 (90 BASE) MCG/ACT inhaler Inhale 2 puffs into the lungs every 6 (six) hours as needed for wheezing or shortness of breath.  . Alum & Mag Hydroxide-Simeth (MAGIC MOUTHWASH W/LIDOCAINE) SOLN Take 5 mLs by mouth 3 (three) times daily as needed for mouth pain.  [name] amLODipine (NORVASC) 5 MG tablet Take 1 tablet (5 mg total) by mouth daily.  [name] aspirin 81 MG tablet Take 81 mg by mouth daily.   . Ca Carbonate-Mag Hydroxide (ROLAIDS PO) Take 1-2 tablets by mouth as needed.  . clobetasol cream (TEMOVATE) 2.53 % Apply 1 application topically 2 (two) times daily.  . ferrous sulfate 325 (65 FE) MG tablet Take 325 mg by mouth daily with breakfast.  . hydrochlorothiazide (HYDRODIURIL) 12.5 MG tablet Take 1 tablet (12.5 mg total) by mouth daily.  . Menthol, Topical Analgesic, (BIOFREEZE) 4 % GEL Apply 1 application topically daily as needed (pain). As needed  . Misc Natural Products (OSTEO BI-FLEX ADV DOUBLE ST PO) Take 1 tablet by mouth daily.   . Multiple Vitamin  (MULTIVITAMIN WITH MINERALS) TABS tablet Take 1 tablet by mouth daily. Centrum Silver  . polyethylene glycol (MIRALAX / GLYCOLAX) packet Take 17 g by mouth daily as needed for mild constipation.   . [DISCONTINUED] clonazePAM (KLONOPIN) 0.5 MG tablet Take 0.5 mg by mouth at bedtime as needed for anxiety.  . [DISCONTINUED] esomeprazole (NEXIUM) 20 MG capsule Take 40 mg by mouth daily at 12 noon.  . [DISCONTINUED] Hydrocortisone Micronized POWD GARGLE AND SWALLOW ONE TEASPOONFUL 4 TIMES A DAY AS NEEDED.  . [  DISCONTINUED] triamcinolone cream (KENALOG) 0.1 % Apply 1 application topically daily. For back area    Allergies  Allergen Reactions  . Azithromycin Shortness Of Breath    Trouble breathing  . Ciprofloxacin Other (See Comments)    REACTION: hallucinations  . Levofloxacin Other (See Comments)    Insomnia, indigestion, tingling sensation in legs  . Latex Rash    rash    Current Medications, Allergies, Past Medical History, Past Surgical History, Family History, and Social History were reviewed in [employer] record.    Review of Systems         See HPI - all other systems neg except as noted... The patient complains of decreased hearing, dyspnea on exertion, muscle weakness, and difficulty walking.  The patient denies anorexia, fever, weight loss, weight gain, vision loss, hoarseness, chest pain, syncope, peripheral edema, prolonged cough, headaches, hemoptysis, abdominal pain, melena, hematochezia, severe indigestion/heartburn, hematuria, incontinence, suspicious skin lesions, transient blindness, depression, unusual weight change, abnormal bleeding, enlarged lymph nodes, and angioedema.     Objective:   Physical Exam     WD, WN, Chr ill appearing 80 y/o WF in NAD... GENERAL:  Alert & oriented; pleasant & cooperative... HEENT:  [place]/AT, EOM-full, EACs-clear, TMs-wnl, NOSE-clear, THROAT-clear & wnl. NECK:  Supple w/ fairROM; no JVD; normal carotid impulses w/o  bruits; no thyromegaly or nodules palpated; no lymphadenopathy. CHEST:  Clear to P & A; without wheezes/ rales/ or rhonchi heard... HEART:  Regular Rhythm; without murmurs/ rubs/ or gallops detected... ABDOMEN:  Soft & nontender; normal bowel sounds; no organomegaly or masses palpated... EXT:  mod arthritic changes, walks w/ cane, +venous insuffic & tr edema., scattered varicose veins... NEURO:  CN's intact; motor testing normal; no focal deficits... DERM:   mild intertrig rash under breast, & onychomycosis of toenails...  RADIOLOGY DATA:  Reviewed in the EPIC EMR & discussed w/ the patient...  LABORATORY DATA:  Reviewed in the EPIC EMR & discussed w/ the patient...   Assessment & Plan:    DYSPNEA>  Hx asthma, stable off Advair, on Proair prn; hx anxiety component on Klonopin but she is not using!  HBP>  Controlled on Amlod5 + HCT12.5; continue same...  RBBB>  Aware & denies CP, palpit, ch in [place], etc...  CHOL>  On diet alone & FLP looks reasonable;  We reviewed low chol, low fat diet...  DM>  BS= 95 & A1c is 6.3 when last checked;  on diet alone & we reviewed low carb no sweets etc...  GI> Indigestion, Divertics> she notes most bowel symptoms resolved off spicey foods;  UGI symptoms w/ dysphagia, food sticking, belch/gas/ etc have not resolved on Protonix40Bid and after GI consult w/ DrPerry; we reviewed her UGI series results and decided to proceed w/ MBS by Speech Path  UTI>  Klebsiella UTI resolved after Septra Rx... She has been eval by DrMacDiarmid.  DJD, LBP, Spinal Stenosis>  Prev evals by Ortho, DrRamos, DrNudelman etc; improved after THR w/ better ambulation; c/o neuropathic discomfort in legs- she will f/u w/ Ramos for shots, try Lyrica50 in the interim...  Anxiety>  If she would take the Klonopin, I feel it would help...   Patient's Medications  New Prescriptions   HYDROXYZINE (ATARAX/VISTARIL) 10 MG TABLET    Take 1 tablet (10 mg total) by mouth every 6 (six) hours  as needed for itching.  Previous Medications   ADVAIR DISKUS 100-50 MCG/DOSE AEPB    Inhale 1 puff into the lungs as needed.  ALBUTEROL (PROVENTIL HFA;VENTOLIN HFA) 108 (90 BASE) MCG/ACT INHALER    Inhale 2 puffs into the lungs every 6 (six) hours as needed for wheezing or shortness of breath.   ALUM & MAG HYDROXIDE-SIMETH (MAGIC MOUTHWASH W/LIDOCAINE) SOLN    Take 5 mLs by mouth 3 (three) times daily as needed for mouth pain.   AMLODIPINE (NORVASC) 5 MG TABLET    Take 1 tablet (5 mg total) by mouth daily.   ASPIRIN 81 MG TABLET    Take 81 mg by mouth daily.    CA CARBONATE-MAG HYDROXIDE (ROLAIDS PO)    Take 1-2 tablets by mouth as needed.   CLOBETASOL CREAM (TEMOVATE) 0.05 %    Apply 1 application topically 2 (two) times daily.   FERROUS SULFATE 325 (65 FE) MG TABLET    Take 325 mg by mouth daily with breakfast.   HYDROCHLOROTHIAZIDE (HYDRODIURIL) 12.5 MG TABLET    Take 1 tablet (12.5 mg total) by mouth daily.   MENTHOL, TOPICAL ANALGESIC, (BIOFREEZE) 4 % GEL    Apply 1 application topically daily as needed (pain). As needed   MISC NATURAL PRODUCTS (OSTEO BI-FLEX ADV DOUBLE ST PO)    Take 1 tablet by mouth daily.    MULTIPLE VITAMIN (MULTIVITAMIN WITH MINERALS) TABS TABLET    Take 1 tablet by mouth daily. Centrum Silver   POLYETHYLENE GLYCOL (MIRALAX / GLYCOLAX) PACKET    Take 17 g by mouth daily as needed for mild constipation.   Modified Medications   No medications on file  Discontinued Medications   CLONAZEPAM (KLONOPIN) 0.5 MG TABLET    Take 0.5 mg by mouth at bedtime as needed for anxiety.   ESOMEPRAZOLE (NEXIUM) 20 MG CAPSULE    Take 40 mg by mouth daily at 12 noon.   HYDROCORTISONE MICRONIZED POWD    GARGLE AND SWALLOW ONE TEASPOONFUL 4 TIMES A DAY AS NEEDED.   TRIAMCINOLONE CREAM (KENALOG) 0.1 %    Apply 1 application topically daily. For back area

## 2014-09-28 NOTE — Therapy (Signed)
Physical Therapy Treatment  Patient Details  Name: [name] MRN: [id number] Date of Birth: July 25, 1923  Encounter Date: 09/28/2014      PT End of Session - 09/28/14 1321    Visit Number 10   Number of Visits 17   Date for PT Re-Evaluation 10/08/14   PT Start Time 1103   PT Stop Time 1146   PT Time Calculation (min) 43 min      Past Medical History  Diagnosis Date  . Shortness of breath   . Unspecified essential hypertension   . Right bundle branch block   . Other and unspecified hyperlipidemia   . Other abnormal glucose   . Diverticulosis of colon (without mention of hemorrhage)   . Pyelonephritis, unspecified   . Solitary cyst of breast   . Osteoarthrosis, unspecified whether generalized or localized, unspecified site   . Lumbago   . Spinal stenosis, unspecified region other than cervical   . Anemia, unspecified   . GERD (gastroesophageal reflux disease)   . Pyelonephritis   . DJD (degenerative joint disease)   . UTI (lower urinary tract infection)     Past Surgical History  Procedure Laterality Date  . Cataract extraction    . Right hip hemiarthroplasty      total  . Conversion to right thr      There were no vitals taken for this visit.  Visit Diagnosis:  Abnormality of gait      Subjective Assessment - 09/28/14 1110    Symptoms Pt going to see her primary care MD today.  Denies falls.  Still has rash.   Currently in Pain? No/denies            [facility] - 09/28/14 1318    Posture/Postural Control   Posture/Postural Control Postural limitations   Postural Limitations Forward head;Rounded Shoulders;Flexed trunk  standing at door frame for upright posture x 2 minutes   Knee/Hip Exercises: Standing   Other Standing Knee Exercises bil LE standing hip flexion, hip abd, hip extension, hamstring curl-all 10 reps with 3# weight   Knee/Hip Exercises: Seated   Long Arc Quad AROM;Strengthening;Both;15 reps;Weights   Long  Arc Quad Weight 3 lbs.   Other Seated Knee Exercises seated bil hip flexion x 15 with 3# weight   Ankle Exercises: Standing   Heel Raises 15 reps   Toe Raise 15 reps   Other Standing Ankle Exercises stepping across balance beam in parallel bars with min UE support   Balance Exercises   Sidestepping 5 reps;Other (comment)  in parallel bars with 3# weight   Balance Beam static standing on foam balance beam in parallel bars with min assist for support at times                Plan - 09/28/14 1323    Clinical Impression Statement Pt continues to be fearful of falls and hesitant to challenge balance.   Pt will benefit from skilled therapeutic intervention in order to improve on the following deficits Abnormal gait;Decreased mobility;Decreased strength   PT Frequency 2x / week   PT Next Visit Plan Continue strengthening and balance activities        Problem List Patient Active Problem List   Diagnosis Date Noted  . Chronic venous insufficiency 04/14/2014  . Abnormality of gait 08/17/2013  . Acute URI 07/29/2012  . GERD (gastroesophageal reflux disease) 09/12/2011  . Neck pain 06/18/2011  . Anxiety 05/14/2011  . Acute bronchitis 05/02/2011  . ONYCHOMYCOSIS 12/13/2008  .  UTI 12/13/2008  . DERMATITIS, ALLERGIC 12/13/2008  . EDEMA 10/21/2008  . DIVERTICULOSIS OF COLON 12/01/2007  . CONSTIPATION 12/01/2007  . PYELONEPHRITIS 12/01/2007  . BREAST CYST 12/01/2007  . DEGENERATIVE JOINT DISEASE 12/01/2007  . DYSPNEA 12/01/2007  . SPINAL STENOSIS 10/28/2007  . HYPERLIPIDEMIA 10/27/2007  . ANEMIA 10/27/2007  . HYPERTENSION 10/27/2007  . RIGHT BUNDLE BRANCH BLOCK 10/27/2007  . LOW BACK PAIN SYNDROME 10/27/2007  . DIABETES MELLITUS, BORDERLINE 10/27/2007   Newell Coralobertson, [name] 09/28/2014, 1:41 PM

## 2014-09-29 ENCOUNTER — Ambulatory Visit: Payer: Medicare Other | Admitting: Pulmonary Disease

## 2014-09-30 ENCOUNTER — Ambulatory Visit: Payer: Medicare Other | Admitting: Physical Therapy

## 2014-09-30 ENCOUNTER — Encounter: Payer: Self-pay | Admitting: Physical Therapy

## 2014-09-30 DIAGNOSIS — R269 Unspecified abnormalities of gait and mobility: Secondary | ICD-10-CM | POA: Diagnosis not present

## 2014-09-30 LAB — QUANTIFERON TB GOLD ASSAY (BLOOD)
INTERFERON GAMMA RELEASE ASSAY: NEGATIVE
Mitogen value: >10 IU/mL
Quantiferon Nil Value: 0.11 IU/mL
Quantiferon Tb Ag Minus Nil Value: 0.03 IU/mL
TB Ag value: 0.14 IU/mL

## 2014-09-30 NOTE — Therapy (Signed)
Physical Therapy Treatment  Patient Details  Name: [name] MRN: [id number] Date of Birth: June 04, 1923  Encounter Date: 09/30/2014      PT End of Session - 09/30/14 1708    Visit Number 11   Number of Visits 17   Date for PT Re-Evaluation 10/08/14   PT Start Time 1315   PT Stop Time 1401   PT Time Calculation (min) 46 min      Past Medical History  Diagnosis Date  . Shortness of breath   . Unspecified essential hypertension   . Right bundle branch block   . Other and unspecified hyperlipidemia   . Other abnormal glucose   . Diverticulosis of colon (without mention of hemorrhage)   . Pyelonephritis, unspecified   . Solitary cyst of breast   . Osteoarthrosis, unspecified whether generalized or localized, unspecified site   . Lumbago   . Spinal stenosis, unspecified region other than cervical   . Anemia, unspecified   . GERD (gastroesophageal reflux disease)   . Pyelonephritis   . DJD (degenerative joint disease)   . UTI (lower urinary tract infection)     Past Surgical History  Procedure Laterality Date  . Cataract extraction    . Right hip hemiarthroplasty      total  . Conversion to right thr      There were no vitals taken for this visit.  Visit Diagnosis:  Abnormality of gait      Subjective Assessment - 09/30/14 1700    Symptoms pt. states she worked in her garden this morning - feels that she is doing better   Currently in Pain? No/denies            [facility] Treatment/Exercise - 09/30/14 1701    Ambulation/Gait   Ambulation/Gait Yes   Ambulation/Gait Assistance 5: Supervision   Ambulation Distance (Feet) 120 Feet   Assistive device Straight cane   Gait Pattern --  forward head posture   Stairs Yes   Stairs Assistance 5: Supervision   Number of Stairs 4   High Level Balance   High Level Balance Activities Marching forwards  marching in place with SBA   High Level Balance Comments sit to stand x 10 reps without UE support:  rockerboard with minimal UE support inside parallel bars;  standing on balance beam for hip strategy with head turns with CGA; stepping over/back of balance beam with RUE support   Knee/Hip Exercises: Aerobic   Stationary Bike 5"  scifit level 1.4 with UE's and LE's   Knee/Hip Exercises: Standing   Other Standing Knee Exercises bil LE standing hip flexion, hip abd, hip extension, hamstring curl-all 10 reps with 3# weight                Plan - 09/30/14 1708    Clinical Impression Statement pt. ambulating with cane without hand held assist from daughter today but continues to look down at feet during gait   Pt will benefit from skilled therapeutic intervention in order to improve on the following deficits Abnormal gait;Decreased balance;Decreased strength   Rehab Potential Good   PT Frequency 2x / week   PT Duration 2 weeks   PT Treatment/Interventions Balance training;Gait training;Stair training;Therapeutic exercise;Patient/family education;Therapeutic activities   PT Next Visit Plan Continue strengthening and balance activities   PT Plan Dynamic balance activities, strengthening, gait        Problem List Patient Active Problem List   Diagnosis Date Noted  . Chronic venous insufficiency 04/14/2014  .  Abnormality of gait 08/17/2013  . Acute URI 07/29/2012  . GERD (gastroesophageal reflux disease) 09/12/2011  . Neck pain 06/18/2011  . Anxiety 05/14/2011  . Acute bronchitis 05/02/2011  . ONYCHOMYCOSIS 12/13/2008  . UTI 12/13/2008  . DERMATITIS, ALLERGIC 12/13/2008  . EDEMA 10/21/2008  . DIVERTICULOSIS OF COLON 12/01/2007  . CONSTIPATION 12/01/2007  . PYELONEPHRITIS 12/01/2007  . BREAST CYST 12/01/2007  . Osteoarthritis 12/01/2007  . DYSPNEA 12/01/2007  . Spinal stenosis of lumbar region 10/28/2007  . HYPERLIPIDEMIA 10/27/2007  . ANEMIA 10/27/2007  . Essential hypertension 10/27/2007  . RIGHT BUNDLE BRANCH BLOCK 10/27/2007  . LOW BACK PAIN SYNDROME 10/27/2007  .  DIABETES MELLITUS, BORDERLINE 10/27/2007                                             Gwendolyn FortSuzanne Deleon [name], PT [facility] [address] [place], [place] [address] [phone]  Gwendolyn KosDilday, [name] Gwendolyn 09/30/2014, 5:16 PM

## 2014-10-05 ENCOUNTER — Telehealth: Payer: Self-pay | Admitting: Pulmonary Disease

## 2014-10-05 ENCOUNTER — Encounter: Payer: Self-pay | Admitting: Physical Therapy

## 2014-10-05 ENCOUNTER — Ambulatory Visit: Payer: Medicare Other | Admitting: Physical Therapy

## 2014-10-05 DIAGNOSIS — R269 Unspecified abnormalities of gait and mobility: Secondary | ICD-10-CM | POA: Diagnosis not present

## 2014-10-05 NOTE — Therapy (Signed)
Physical Therapy Treatment  Patient Details  Name: Gwendolyn DolphinMargaret J Obryan MRN: [id number] Date of Birth: 1923/10/17  Encounter Date: 10/05/2014      PT End of Session - 10/05/14 1332    Visit Number 12   Number of Visits 17   Date for PT Re-Evaluation 10/08/14   PT Start Time 1102   PT Stop Time 1151   PT Time Calculation (min) 49 min      Past Medical History  Diagnosis Date  . Shortness of breath   . Unspecified essential hypertension   . Right bundle branch block   . Other and unspecified hyperlipidemia   . Other abnormal glucose   . Diverticulosis of colon (without mention of hemorrhage)   . Pyelonephritis, unspecified   . Solitary cyst of breast   . Osteoarthrosis, unspecified whether generalized or localized, unspecified site   . Lumbago   . Spinal stenosis, unspecified region other than cervical   . Anemia, unspecified   . GERD (gastroesophageal reflux disease)   . Pyelonephritis   . DJD (degenerative joint disease)   . UTI (lower urinary tract infection)     Past Surgical History  Procedure Laterality Date  . Cataract extraction    . Right hip hemiarthroplasty      total  . Conversion to right thr      There were no vitals taken for this visit.  Visit Diagnosis:  Abnormality of gait      Subjective Assessment - 10/05/14 1315    Symptoms daughter reports that pt. is not feeling well due to rash (took a vinegar bath this am); sees dermatologist again this PM   Currently in Pain? No/denies   Multiple Pain Sites No            [facility] Adult PT Treatment/Exercise - 10/05/14 1324    Transfers   Sit to Stand 4: Min guard  10 reps on blue foam for compliant surface training   Ambulation/Gait   Ambulation/Gait Yes   Ambulation/Gait Assistance 5: Supervision   Ambulation/Gait Assistance Details pt. cont. to look down - needs cues to look up   Ambulation Distance (Feet) 200 Feet   Assistive device Straight cane   Gait Pattern Within Functional Limits   Stairs Yes   Stairs Assistance 5: Supervision   Number of Stairs 4   Ramp 4: Min assist  with cane x 2 reps   Posture/Postural Control   Posture/Postural Control Postural limitations   Postural Limitations Forward head;Increased thoracic kyphosis   High Level Balance   High Level Balance Activities Head turns   High Level Balance Comments stepping over/back of balance beam with min. hand held assist   Knee/Hip Exercises: Aerobic   Stationary Bike 5"  Nustep level 3   Knee/Hip Exercises: Standing   Heel Raises 10 reps   Stairs step ups with RLE leading 10 reps   Rocker Board 1 minute  with head turns with UE support prn with CGA   Other Standing Knee Exercises 3# weight used on each leg for hip flexion - forward kick - hip extension and hip abduction in standing with knee straight                 Plan - 10/05/14 1332    Clinical Impression Statement pt. reports not feeling as well today due to am appt.time and due to rash - pt. reported legs felt trembly today   Pt will benefit from skilled therapeutic intervention in order to improve on  the following deficits Abnormal gait;Decreased balance;Decreased strength   Rehab Potential Good   PT Frequency 2x / week   PT Duration --  1 week   PT Treatment/Interventions Balance training;Gait training;Stair training;Therapeutic exercise;Patient/family education;Therapeutic activities   PT Next Visit Plan Continue strengthening and balance activities   PT Plan Dynamic balance activities, strengthening, gait        Problem List Patient Active Problem List   Diagnosis Date Noted  . Chronic venous insufficiency 04/14/2014  . Abnormality of gait 08/17/2013  . Acute URI 07/29/2012  . GERD (gastroesophageal reflux disease) 09/12/2011  . Neck pain 06/18/2011  . Anxiety 05/14/2011  . Acute bronchitis 05/02/2011  . ONYCHOMYCOSIS 12/13/2008  . UTI 12/13/2008  . DERMATITIS, ALLERGIC 12/13/2008  . EDEMA 10/21/2008  . DIVERTICULOSIS  OF COLON 12/01/2007  . CONSTIPATION 12/01/2007  . PYELONEPHRITIS 12/01/2007  . BREAST CYST 12/01/2007  . Osteoarthritis 12/01/2007  . DYSPNEA 12/01/2007  . Spinal stenosis of lumbar region 10/28/2007  . HYPERLIPIDEMIA 10/27/2007  . ANEMIA 10/27/2007  . Essential hypertension 10/27/2007  . RIGHT BUNDLE BRANCH BLOCK 10/27/2007  . LOW BACK PAIN SYNDROME 10/27/2007  . DIABETES MELLITUS, BORDERLINE 10/27/2007                                              Kary KosDilday, Jeremy Mclamb Suzanne 10/05/2014, 1:37 PM

## 2014-10-05 NOTE — Telephone Encounter (Signed)
Informed daughter that there is no record of Zoster vaccine.  Nothing else needed.  [facility]

## 2014-10-05 NOTE — Telephone Encounter (Signed)
lmtcb x1 No record of Zoster vaccine

## 2014-10-07 ENCOUNTER — Ambulatory Visit: Payer: Medicare Other | Admitting: Physical Therapy

## 2014-10-11 ENCOUNTER — Ambulatory Visit: Payer: Medicare Other | Admitting: Physical Therapy

## 2014-10-11 ENCOUNTER — Encounter: Payer: Self-pay | Admitting: Physical Therapy

## 2014-10-11 VITALS — BP 165/97 | HR 75

## 2014-10-11 DIAGNOSIS — R269 Unspecified abnormalities of gait and mobility: Secondary | ICD-10-CM

## 2014-10-11 NOTE — Therapy (Signed)
Physical Therapy Treatment  Patient Details  Name: Gwendolyn DolphinMargaret J Richter MRN: [id number] Date of Birth: June 16, 1923  Encounter Date: 10/11/2014      PT End of Session - 10/11/14 1213    Visit Number 13   Number of Visits 17   Date for PT Re-Evaluation 10/08/14   PT Start Time 1148   PT Stop Time 1201   PT Time Calculation (min) 13 min      Past Medical History  Diagnosis Date  . Shortness of breath   . Unspecified essential hypertension   . Right bundle branch block   . Other and unspecified hyperlipidemia   . Other abnormal glucose   . Diverticulosis of colon (without mention of hemorrhage)   . Pyelonephritis, unspecified   . Solitary cyst of breast   . Osteoarthrosis, unspecified whether generalized or localized, unspecified site   . Lumbago   . Spinal stenosis, unspecified region other than cervical   . Anemia, unspecified   . GERD (gastroesophageal reflux disease)   . Pyelonephritis   . DJD (degenerative joint disease)   . UTI (lower urinary tract infection)     Past Surgical History  Procedure Laterality Date  . Cataract extraction    . Right hip hemiarthroplasty      total  . Conversion to right thr      BP 165/97 mmHg  Pulse 75  Visit Diagnosis:  Abnormality of gait - Plan: PT plan of care cert/re-cert      Subjective Assessment - 10/11/14 1208    Symptoms pt. reports left knee has been "giving out" some recently; forgot to bring brace today; pt. reported some dizziness after performing one balance activity   Currently in Pain? No/denies   Multiple Pain Sites No            [facility] Adult PT Treatment/Exercise - 10/11/14 1210    Transfers   Transfers Sit to Stand  5 reps   Balance   Balance Assessed Yes   Dynamic Standing Balance   Dynamic Standing - Balance Support Left upper extremity supported  hand hel assist   Dynamic Standing - Comments pt. performed RLE tap ups to foot stool x 10 reps with LUE hand held assist.              PT  Long Term Goals - 10/11/14 1217    PT LONG TERM GOAL #1   Title verbalize understanding of fall prevention strategies within home environment   Time 2   Period Days   Status On-going   PT LONG TERM GOAL #2   Title Increase Berf balance test score to >=47/56 to decrease fall risk   Time 2   Period Days   Status On-going   PT LONG TERM GOAL #3   Title Improve Timed Up and Go score to <=16.0 seconds with can for reduced fall risk   Time 2   Period Days   Status On-going   PT LONG TERM GOAL #4   Title Increase gait velocity to >=2.3 ft/sec with can for increased gait efficiency   Time 2   Period Days   Status On-going   PT LONG TERM GOAL #5   Title Perform sit to stand x 1 rep without UE support from high/low mat table to demo incr. lower extremity   Time 2   Period Days   Status On-going   PT LONG TERM GOAL #6   Title Improve FOTO to >=53   Time 2  Period Days   Status On-going          Plan - 10/11/14 1214    Clinical Impression Statement pt. reported dizziness after performing 1 balance activity;  BP recorded as 165/97 so session was discontinued - daughter reported that pt. had not taken BP med this AM   Pt will benefit from skilled therapeutic intervention in order to improve on the following deficits Abnormal gait;Decreased balance;Decreased strength   Rehab Potential Good   PT Frequency 2x / week   PT Duration 2 weeks   PT Treatment/Interventions Balance training;Gait training;Stair training;Therapeutic exercise;Patient/family education;Therapeutic activities   PT Next Visit Plan check LTG's - plan to D/C   PT Plan Dynamic balance activities, strengthening, gait        Problem List Patient Active Problem List   Diagnosis Date Noted  . Chronic venous insufficiency 04/14/2014  . Abnormality of gait 08/17/2013  . Acute URI 07/29/2012  . GERD (gastroesophageal reflux disease) 09/12/2011  . Neck pain 06/18/2011  . Anxiety 05/14/2011  . Acute bronchitis  05/02/2011  . ONYCHOMYCOSIS 12/13/2008  . UTI 12/13/2008  . DERMATITIS, ALLERGIC 12/13/2008  . EDEMA 10/21/2008  . DIVERTICULOSIS OF COLON 12/01/2007  . CONSTIPATION 12/01/2007  . PYELONEPHRITIS 12/01/2007  . BREAST CYST 12/01/2007  . Osteoarthritis 12/01/2007  . DYSPNEA 12/01/2007  . Spinal stenosis of lumbar region 10/28/2007  . HYPERLIPIDEMIA 10/27/2007  . ANEMIA 10/27/2007  . Essential hypertension 10/27/2007  . RIGHT BUNDLE BRANCH BLOCK 10/27/2007  . LOW BACK PAIN SYNDROME 10/27/2007  . DIABETES MELLITUS, BORDERLINE 10/27/2007                                     Kerry FortSuzanne Oather Muilenburg, PT [facility] [address] [place], [place] [address] [phone]          Kary KosDilday, Sukanya Goldblatt Suzanne 10/11/2014, 12:24 PM

## 2014-10-11 NOTE — Addendum Note (Signed)
Addended by: Althea CharonILDAY, Neizan Debruhl S on: 10/11/2014 12:53 PM   Modules accepted: Orders

## 2014-10-12 ENCOUNTER — Encounter: Payer: Self-pay | Admitting: Physical Therapy

## 2014-10-12 ENCOUNTER — Ambulatory Visit: Payer: Medicare Other | Admitting: Physical Therapy

## 2014-10-12 DIAGNOSIS — R269 Unspecified abnormalities of gait and mobility: Secondary | ICD-10-CM | POA: Diagnosis not present

## 2014-10-12 NOTE — Therapy (Signed)
Physical Therapy Treatment  Patient Details  Name: LEILANIE RAUDA MRN: [id number] Date of Birth: 06-02-1923  Encounter Date: 10/12/2014      PT End of Session - 10/12/14 1705    Visit Number 14   Number of Visits 17   Date for PT Re-Evaluation 10/12/14   PT Start Time 1105   PT Stop Time 1146   PT Time Calculation (min) 41 min      Past Medical History  Diagnosis Date  . Shortness of breath   . Unspecified essential hypertension   . Right bundle branch block   . Other and unspecified hyperlipidemia   . Other abnormal glucose   . Diverticulosis of colon (without mention of hemorrhage)   . Pyelonephritis, unspecified   . Solitary cyst of breast   . Osteoarthrosis, unspecified whether generalized or localized, unspecified site   . Lumbago   . Spinal stenosis, unspecified region other than cervical   . Anemia, unspecified   . GERD (gastroesophageal reflux disease)   . Pyelonephritis   . DJD (degenerative joint disease)   . UTI (lower urinary tract infection)     Past Surgical History  Procedure Laterality Date  . Cataract extraction    . Right hip hemiarthroplasty      total  . Conversion to right thr      There were no vitals taken for this visit.  Visit Diagnosis:  Abnormality of gait      Subjective Assessment - 10/12/14 1655    Symptoms "I'm ready to finish up today"   Currently in Pain? No/denies   Multiple Pain Sites No            [facility] Adult PT Treatment/Exercise - 10/12/14 1657    Transfers   Sit to Stand 7: Independent;Without upper extremity assist   Ambulation/Gait   Ambulation/Gait Yes   Ambulation/Gait Assistance 5: Supervision   Ambulation/Gait Assistance Details cues to look up   Ambulation Distance (Feet) 100 Feet   Assistive device Straight cane   Gait velocity 1.87   Posture/Postural Control   Posture/Postural Control Postural limitations   Postural Limitations Forward head;Rounded Shoulders;Increased thoracic kyphosis    Berg Balance Test   Sit to Stand Able to stand without using hands and stabilize independently   Standing Unsupported Able to stand safely 2 minutes   Sitting with Back Unsupported but Feet Supported on Floor or Stool Able to sit safely and securely 2 minutes   Stand to Sit Sits safely with minimal use of hands   Transfers Able to transfer safely, minor use of hands   Standing Unsupported with Eyes Closed Able to stand 10 seconds safely   Standing Ubsupported with Feet Together Able to place feet together independently and stand 1 minute safely   From Standing, Reach Forward with Outstretched Arm Can reach confidently >25 cm (10")   From Standing Position, Pick up Object from Floor Able to pick up shoe safely and easily   From Standing Position, Turn to Look Behind Over each Shoulder Looks behind from both sides and weight shifts well   Turn 360 Degrees Able to turn 360 degrees safely but slowly   Standing Unsupported, Alternately Place Feet on Step/Stool Able to complete >2 steps/needs minimal assist   Standing Unsupported, One Foot in Front Able to plae foot ahead of the other independently and hold 30 seconds   Standing on One Leg Tries to lift leg/unable to hold 3 seconds but remains standing independently   Total Score  47   Timed Up and Go Test   Normal TUG (seconds) 15.78  with cane          PT Education - 11-04-14 1703    Education provided Yes   Education Details fall prevention techniques to maximize safety in the home; discussed benefits of continuing HEP   Person(s) Educated Patient;Child(ren)   Methods Explanation   Comprehension Verbalized understanding            PT Long Term Goals - 2014-11-04 1708    PT LONG TERM GOAL #1   Title verbalize understanding of fall prevention strategies within home environment   Status Achieved   PT LONG TERM GOAL #2   Title Increase Berg balance test score to >=47/56 to decrease fall risk  2014-11-04   Baseline score 47/56   Time  --  11-04-2014   Status Achieved   PT LONG TERM GOAL #3   Title Improve Timed Up and Go score to <=16.0 seconds with cane for reduced fall risk   Baseline 15.7 secs without cane   Time --  Nov 04, 2014   Status Achieved   PT LONG TERM GOAL #4   Title Increase gait velocity to >=2.3 ft/sec with can for increased gait efficiency   Baseline 1.87 ft/sec with cane   Time --  11-04-14   Status Not Met   PT LONG TERM GOAL #5   Title Perform sit to stand x 1 rep without UE support from high/low mat table to demo incr. lower extremity   Time --  11-04-2014   Status Achieved          Plan - 2014-11-04 1705    Clinical Impression Statement pt. has met all LTG's; has plateaued in maximizing functional progress at this time; no significant changes in Berg score or gait velocity since 09-07-14 assessment   PT Next Visit Plan D/C tomorrow          G-Codes - 11-04-14 1713    Functional Assessment Tool Used Merrilee Jansky; TUG   Functional Limitation Mobility: Walking and moving around   Mobility: Walking and Moving Around Goal Status [phone]) At least 20 percent but less than 40 percent impaired, limited or restricted   Mobility: Walking and Moving Around Discharge Status [phone]) At least 1 percent but less than 20 percent impaired, limited or restricted      Problem List Patient Active Problem List   Diagnosis Date Noted  . Chronic venous insufficiency 04/14/2014  . Abnormality of gait 08/17/2013  . Acute URI 07/29/2012  . GERD (gastroesophageal reflux disease) 09/12/2011  . Neck pain 06/18/2011  . Anxiety 05/14/2011  . Acute bronchitis 05/02/2011  . ONYCHOMYCOSIS 12/13/2008  . UTI 12/13/2008  . DERMATITIS, ALLERGIC 12/13/2008  . EDEMA 10/21/2008  . DIVERTICULOSIS OF COLON 12/01/2007  . CONSTIPATION 12/01/2007  . PYELONEPHRITIS 12/01/2007  . BREAST CYST 12/01/2007  . Osteoarthritis 12/01/2007  . DYSPNEA 12/01/2007  . Spinal stenosis of lumbar region 10/28/2007  . HYPERLIPIDEMIA 10/27/2007   . ANEMIA 10/27/2007  . Essential hypertension 10/27/2007  . RIGHT BUNDLE BRANCH BLOCK 10/27/2007  . LOW BACK PAIN SYNDROME 10/27/2007  . DIABETES MELLITUS, BORDERLINE 10/27/2007               PHYSICAL THERAPY DISCHARGE SUMMARY  Visits from Start of Care: 17  Current functional level related to goals / functional outcomes: Pt. Has met 4/5 LTG's as stated above.  Goal #4 not met due to gait velocity remaining approximately same as at time of  initial evaluation - 1.87 ft/sec with cane today compared to 1.82 ft/sec with cane at initial eval on 08-09-14.   Remaining deficits: Continued postural deviations with pt. Looking down at feet during ambulation and continuing to use cane for assist. With gait. Continued decreased high level balance skills and cont. Decreased gait speed.   Education / Equipment: Pt. and daughter have been instructed in fall prevention techniques for safety in home environment. Pt. Has been instructed in a HEP consisting of balance exercises. Plan: Patient agrees to discharge.  Patient goals were partially met. Patient is being discharged due to meeting the stated rehab goals.  ?????             Guido Sander, [place] [address], [place], [place] [address] [phone]                         Alda Lea 10/12/2014, 5:22 PM

## 2014-10-12 NOTE — Patient Instructions (Signed)

## 2014-10-25 ENCOUNTER — Ambulatory Visit: Payer: Medicare Other | Admitting: Pulmonary Disease

## 2014-11-14 ENCOUNTER — Other Ambulatory Visit: Payer: Self-pay | Admitting: Pulmonary Disease

## 2014-11-15 ENCOUNTER — Telehealth: Payer: Self-pay | Admitting: Pulmonary Disease

## 2014-11-15 DIAGNOSIS — R0602 Shortness of breath: Secondary | ICD-10-CM

## 2014-11-15 DIAGNOSIS — R609 Edema, unspecified: Secondary | ICD-10-CM

## 2014-11-15 MED ORDER — MAGIC MOUTHWASH W/LIDOCAINE: 5.0000 mL | Freq: Four times a day (QID) | ORAL | Status: DC | PRN

## 2014-11-15 NOTE — Telephone Encounter (Addendum)
Called and spoke to pt's daughter, [name]. Pt c/o increase in SOB with any activity, increase usage albuterol hfa, orthopnea, BIL foot edema- red on sole of feet and weakness x 6 days. Pt stated she is unable to do any ADL's d/t weakness. Pt stated she is seeing her dermatologist on 11/16/14. Pt denies cough, CP/tightness and f/c/s. Pt last saw SN on 09/28/14. Correction- SN is available. Will send to SN.   Dr. Kriste BasqueNadel please advise.   Allergies  Allergen Reactions  . Azithromycin Shortness Of Breath    Trouble breathing  . Ciprofloxacin Other (See Comments)    REACTION: hallucinations  . Levofloxacin Other (See Comments)    Insomnia, indigestion, tingling sensation in legs  . Latex Rash    rash    Current Outpatient Prescriptions on File Prior to Visit  Medication Sig Dispense Refill  . ADVAIR DISKUS 100-50 MCG/DOSE AEPB Inhale 1 puff into the lungs as needed.    [name]. albuterol (PROVENTIL HFA;VENTOLIN HFA) 108 (90 BASE) MCG/ACT inhaler Inhale 2 puffs into the lungs every 6 (six) hours as needed for wheezing or shortness of breath.    . Alum & Mag Hydroxide-Simeth (MAGIC MOUTHWASH W/LIDOCAINE) SOLN Take 5 mLs by mouth 3 (three) times daily as needed for mouth pain.    [name]. amLODipine (NORVASC) 5 MG tablet Take 1 tablet (5 mg total) by mouth daily. 30 tablet 6  . aspirin 81 MG tablet Take 81 mg by mouth daily.     . Ca Carbonate-Mag Hydroxide (ROLAIDS PO) Take 1-2 tablets by mouth as needed.    . clobetasol cream (TEMOVATE) 0.05 % Apply 1 application topically 2 (two) times daily.    . ferrous sulfate 325 (65 FE) MG tablet Take 325 mg by mouth daily with breakfast.    . hydrochlorothiazide (HYDRODIURIL) 12.5 MG tablet Take 1 tablet (12.5 mg total) by mouth daily. 30 tablet 6  . hydrOXYzine (ATARAX/VISTARIL) 10 MG tablet Take 1 tablet (10 mg total) by mouth every 6 (six) hours as needed for itching. 50 tablet 1  . Menthol, Topical Analgesic, (BIOFREEZE) 4 % GEL Apply 1 application topically daily  as needed (pain). As needed    . Misc Natural Products (OSTEO BI-FLEX ADV DOUBLE ST PO) Take 1 tablet by mouth daily.     . Multiple Vitamin (MULTIVITAMIN WITH MINERALS) TABS tablet Take 1 tablet by mouth daily. Centrum Silver    . polyethylene glycol (MIRALAX / GLYCOLAX) packet Take 17 g by mouth daily as needed for mild constipation.     . predniSONE (DELTASONE) 10 MG tablet Take 20 mg by mouth daily with breakfast.     No current facility-administered medications on file prior to visit.

## 2014-11-15 NOTE — Telephone Encounter (Signed)
Per SN---  Ok to refer to cardiology per daughters request.  We can do first available unless the daughter has someone in mind.  thanks

## 2014-11-15 NOTE — Telephone Encounter (Signed)
Per SN: 1- last ov breathing was "90% better" on Advair 100 BID and proair prn.  2- is she taking the Klonopin 0.5mg  2 daily (1/2 in am, 1/2 in afternoon, 1 at bedtime)?  3- with orthopnea and B foot edema sounds like she needs stronger diuretics.  Offer to change her HCTZ to Lasix 20mg  once daily.  Can she see TP later this week or me next week?  Thanks.  Called spoke with patient daughter [name] and discussed SN's recommendations with her.  [name] stated that pt is NOT taking her Adviar and is NOT taking the Klonopin at all and refuses to restart.  When I mentioned changed pt's diuretic [name] stated that she has been doing "some research" on CHF and is concerned about this.  She is asking for a cardiology referral.  [name] also mentioned that pt is having some increased confusion to the point where she is afraid to leave her home alone at night, though she is oriented to self/place/time.  She asked about taking pt to the ED but was concerned that she would not be admitted.  Dr Kriste BasqueNadel please advise, thank you.

## 2014-11-15 NOTE — Telephone Encounter (Signed)
Order placed for referral to Cardiology. Pt daughter aware.  Pt daughter requesting that the patients MMW be refilled -- this has been sent to pharmacy.   Pt daughter wanting to know if the patient needs to increase her Hydrochlorothiazide tablet -- pt has 25mg  tablet but only takes 12.5mg  right now. States that the patient's feet and ankles are swelling. Wants to know if increased, how high does the dose need to go? Aware that SN not in office this afternoon or the rest of the week to see patients  Dr Kriste BasqueNadel will be in office tomorrow, we will check in the AM to see if he can answer. Pt daughter states that she would like Dr Jodelle GreenNadel's advice on this when offered to send to DOD to advise on.  Please advise Dr Kriste BasqueNadel. thanks

## 2014-11-16 ENCOUNTER — Telehealth: Payer: Self-pay | Admitting: Cardiovascular Disease

## 2014-11-16 MED ORDER — POTASSIUM CHLORIDE CRYS ER 10 MEQ PO TBCR: 10.0000 meq | EXTENDED_RELEASE_TABLET | Freq: Every day | ORAL | Status: DC

## 2014-11-16 NOTE — Telephone Encounter (Signed)
Per SN---  Ok to increase the HCTZ to 25 mg every morning but she will need to take the KCL 20 meq daily with this.   If she will not take the potassium tablet then she cannot increase the HCTZ.  Lasix would be a better diuretic.

## 2014-11-16 NOTE — Telephone Encounter (Signed)
LMTCB x 1 for Medco Health SolutionsShirley

## 2014-11-16 NOTE — Telephone Encounter (Signed)
Walk in pt form " note dropped off for nishan" gave to christine

## 2014-11-16 NOTE — Telephone Encounter (Signed)
Spoke with pt daughter [name], aware to increase HCTZ and take KCL 20meq in addition. Will call if anything further needed. KCL 20meq sent to pharmacy

## 2014-11-17 ENCOUNTER — Ambulatory Visit (INDEPENDENT_AMBULATORY_CARE_PROVIDER_SITE_OTHER): Payer: Medicare Other | Admitting: Cardiovascular Disease

## 2014-11-17 ENCOUNTER — Encounter: Payer: Self-pay | Admitting: Cardiovascular Disease

## 2014-11-17 VITALS — BP 134/78 | HR 78 | Ht 68.0 in | Wt 170.8 lb

## 2014-11-17 DIAGNOSIS — J438 Other emphysema: Secondary | ICD-10-CM

## 2014-11-17 DIAGNOSIS — J449 Chronic obstructive pulmonary disease, unspecified: Secondary | ICD-10-CM | POA: Insufficient documentation

## 2014-11-17 DIAGNOSIS — L120 Bullous pemphigoid: Secondary | ICD-10-CM

## 2014-11-17 DIAGNOSIS — R609 Edema, unspecified: Secondary | ICD-10-CM

## 2014-11-17 DIAGNOSIS — M4806 Spinal stenosis, lumbar region: Secondary | ICD-10-CM

## 2014-11-17 DIAGNOSIS — Z79899 Other long term (current) drug therapy: Secondary | ICD-10-CM

## 2014-11-17 DIAGNOSIS — M48061 Spinal stenosis, lumbar region without neurogenic claudication: Secondary | ICD-10-CM

## 2014-11-17 DIAGNOSIS — I10 Essential (primary) hypertension: Secondary | ICD-10-CM

## 2014-11-17 DIAGNOSIS — E785 Hyperlipidemia, unspecified: Secondary | ICD-10-CM

## 2014-11-17 LAB — BASIC METABOLIC PANEL
BUN: 21 mg/dL (ref 6–23)
CO2: 34 mEq/L — ABNORMAL HIGH (ref 19–32)
CREATININE: 1.1 mg/dL (ref 0.4–1.2)
Calcium: 9.6 mg/dL (ref 8.4–10.5)
Chloride: 101 mEq/L (ref 96–112)
GFR: 51.23 mL/min — AB (ref >60.00)
GLUCOSE: 82 mg/dL (ref 70–99)
POTASSIUM: 3.4 meq/L — AB (ref 3.5–5.1)
Sodium: 141 mEq/L (ref 135–145)

## 2014-11-17 NOTE — Assessment & Plan Note (Signed)
I think ARB with diuretic would be better drug than amlodipine for her Per Dr Kirstie MirzaNadels notes he tried this and patient wanted to be back on amlodpine  May contribute to edema.  F/U primary

## 2014-11-17 NOTE — Progress Notes (Signed)
Patient ID: Gwendolyn DolphinMargaret J Cashaw, female   DOB: 05/17/1923, 80 y.o.   MRN: [id number]    80 yo referred by Dr Kriste BasqueNadel for edema  Long standing COPD asthma.  FEV1 1.35  Venous insuf.  HTN on amlodipine Apparantly tried on ARB but did not like it.  Been on HCTZ For LE edema No history of DVT Chronic LE and back pain from spinal stenosis Gets injections from Dr Jackqulyn Livingsamos  Saw Dr Tim LairJones Derm for rash and started on prednisone He wanted BMET checked Bullous pemphigoid  No history of CHF, MI afib No PND orthopnea     9/15  BUN 12 Cr 1 K 4   ROS: Denies fever, malais, weight loss, blurry vision, decreased visual acuity, cough, sputum, SOB, hemoptysis, pleuritic pain, palpitaitons, heartburn, abdominal pain, melena, lower extremity edema, claudication, or rash.  All other systems reviewed and negative   General: Affect appropriate Healthy:  appears stated age HEENT: normal Neck supple with no adenopathy JVP normal no bruits no thyromegaly Lungs clear with no wheezing and good diaphragmatic motion Heart:  S1/S2 no murmur,rub, gallop or click PMI normal Abdomen: benighn, BS positve, no tenderness, no AAA no bruit.  No HSM or HJR Distal pulses intact with no bruits Plus one bilateral  edema Neuro non-focal Skin warm and dry No muscular weakness  Medications Current Outpatient Prescriptions  Medication Sig Dispense Refill  . ADVAIR DISKUS 100-50 MCG/DOSE AEPB Inhale 1 puff into the lungs as needed.    [name]. albuterol (PROVENTIL HFA;VENTOLIN HFA) 108 (90 BASE) MCG/ACT inhaler Inhale 2 puffs into the lungs every 6 (six) hours as needed for wheezing or shortness of breath.    . Alum & Mag Hydroxide-Simeth (MAGIC MOUTHWASH W/LIDOCAINE) SOLN Take 5 mLs by mouth 4 (four) times daily as needed for mouth pain. 120 mL 0  . amLODipine (NORVASC) 5 MG tablet Take 1 tablet (5 mg total) by mouth daily. 30 tablet 6  . aspirin 81 MG tablet Take 81 mg by mouth daily.     . Ca Carbonate-Mag Hydroxide (ROLAIDS PO) Take 1-2  tablets by mouth as needed.    . clobetasol cream (TEMOVATE) 0.05 % Apply 1 application topically 2 (two) times daily.    . ferrous sulfate 325 (65 FE) MG tablet Take 325 mg by mouth daily with breakfast.    . hydrochlorothiazide (HYDRODIURIL) 25 MG tablet Take 12.5 mg by mouth daily.    . Hydrocortisone Micronized POWD GARGLE AND SWALLOW ONE TEASPOONFUL 4 TIMES A DAY AS NEEDED. 240 g 0  . hydrOXYzine (ATARAX/VISTARIL) 10 MG tablet Take 1 tablet (10 mg total) by mouth every 6 (six) hours as needed for itching. 50 tablet 1  . Menthol, Topical Analgesic, (BIOFREEZE) 4 % GEL Apply 1 application topically daily as needed (pain). As needed    . Misc Natural Products (OSTEO BI-FLEX ADV DOUBLE ST PO) Take 1 tablet by mouth daily.     . Multiple Vitamin (MULTIVITAMIN WITH MINERALS) TABS tablet Take 1 tablet by mouth daily. Centrum Silver    . polyethylene glycol (MIRALAX / GLYCOLAX) packet Take 17 g by mouth daily as needed for mild constipation.     . potassium chloride (K-DUR,KLOR-CON) 10 MEQ tablet Take 1 tablet (10 mEq total) by mouth daily. 30 tablet 1  . predniSONE (DELTASONE) 10 MG tablet Take 20 mg by mouth daily with breakfast.     No current facility-administered medications for this visit.    Allergies Azithromycin; Ciprofloxacin; Levofloxacin; and Latex  Family History:  Family History  Problem Relation Age of Onset  . Cancer Brother   . Cancer Brother     Social History: History   Social History  . Marital Status: Divorced    Spouse Name: N/A    Number of Children: N/A  . Years of Education: N/A   Occupational History  . retired    Social History Main Topics  . Smoking status: Never Smoker   . Smokeless tobacco: Never Used  . Alcohol Use: No  . Drug Use: No  . Sexual Activity: Not on file   Other Topics Concern  . Not on file   Social History Narrative    Past Surgical History  Procedure Laterality Date  . Cataract extraction    . Right hip hemiarthroplasty       total  . Conversion to right thr      Past Medical History  Diagnosis Date  . Shortness of breath   . Unspecified essential hypertension   . Right bundle branch block   . Other and unspecified hyperlipidemia   . Other abnormal glucose   . Diverticulosis of colon (without mention of hemorrhage)   . Pyelonephritis, unspecified   . Solitary cyst of breast   . Osteoarthrosis, unspecified whether generalized or localized, unspecified site   . Lumbago   . Spinal stenosis, unspecified region other than cervical   . Anemia, unspecified   . GERD (gastroesophageal reflux disease)   . Pyelonephritis   . DJD (degenerative joint disease)   . UTI (lower urinary tract infection)     Electrocardiogram:  SR RBBB pulmonary disease pattern rate 65   Assessment and Plan

## 2014-11-17 NOTE — Assessment & Plan Note (Signed)
On prednisone per Dr Yetta BarreJones may make edema worse  F/u BMET today Derm f/u

## 2014-11-17 NOTE — Patient Instructions (Addendum)
Your physician recommends that you schedule a follow-up appointment in: AS NEEDED Your physician recommends that you continue on your current medications as directed. Please refer to the Current Medication list given to you today. Your physician recommends that you return for lab work in: TODAY  BMET   Your physician has requested that you have an echocardiogram. Echocardiography is a painless test that uses sound waves to create images of your heart. It provides your doctor with information about the size and shape of your heart and how well your heart's chambers and valves are working. This procedure takes approximately one hour. There are no restrictions for this procedure.

## 2014-11-17 NOTE — Assessment & Plan Note (Signed)
Cholesterol is at goal.  Continue current dose of statin and diet Rx.  No myalgias or side effects.  F/U  LFT's in 6 months. Lab Results  Component Value Date   LDLCALC 107* 02/10/2013

## 2014-11-17 NOTE — Assessment & Plan Note (Signed)
Edema not likely related to CHF but venous insuf and chronic lung disease Discussed low sodium diet support hose and diuretic use.  Will let primary adjust diuretic but she is not azotemic and may need more on prednisone.  BMET today  Echo to assess RV/LV function Does not need cardiology f/u

## 2014-11-17 NOTE — Assessment & Plan Note (Signed)
No active wheezing inhalers not used regularly  Echo to assess RV function and pulmonary pressures given edema  F/U Kriste BasqueNadel

## 2014-11-17 NOTE — Assessment & Plan Note (Signed)
Pain at baseline f/u Ramos for injections

## 2014-11-17 NOTE — Addendum Note (Signed)
Addended by: Tonita PhoenixBOWDEN, ROBIN K on: 11/17/2014 08:48 AM   Modules accepted: Orders

## 2014-11-18 ENCOUNTER — Telehealth: Payer: Self-pay | Admitting: *Deleted

## 2014-11-18 DIAGNOSIS — E876 Hypokalemia: Secondary | ICD-10-CM

## 2014-11-18 NOTE — Telephone Encounter (Signed)
-----   Message from Wendall StadePeter C Nishan, MD sent at 11/17/2014  3:09 PM EST ----- Increase K to 20 / day and f/u BMET in 3 weeks

## 2014-11-18 NOTE — Telephone Encounter (Signed)
I spoke with pt daughter about lab results. They have not picked up the potassium orderd by Dr. Kriste BasqueNadel. She will pick it up & increase to 20meq daily as per Dr. Eden EmmsNishan  Return to office for BMP on 12/13/14 Mylo Redebbie Zakir Henner RN

## 2014-11-18 NOTE — Addendum Note (Signed)
Addended by: Lisabeth DevoidGRAY, Krystel Fletchall F on: 11/18/2014 03:49 PM   Modules accepted: Orders

## 2014-11-22 ENCOUNTER — Emergency Department (HOSPITAL_COMMUNITY): Payer: Medicare Other

## 2014-11-22 ENCOUNTER — Telehealth: Payer: Self-pay | Admitting: Pulmonary Disease

## 2014-11-22 ENCOUNTER — Emergency Department (HOSPITAL_COMMUNITY)
Admission: EM | Admit: 2014-11-22 | Discharge: 2014-11-22 | Disposition: A | Discharge status: Home / Self Care | Payer: Medicare Other | Attending: Emergency Medicine | Admitting: Emergency Medicine

## 2014-11-22 ENCOUNTER — Encounter (HOSPITAL_COMMUNITY): Payer: Self-pay | Admitting: Emergency Medicine

## 2014-11-22 DIAGNOSIS — Z8719 Personal history of other diseases of the digestive system: Secondary | ICD-10-CM | POA: Diagnosis not present

## 2014-11-22 DIAGNOSIS — Z91040 Latex allergy status: Secondary | ICD-10-CM | POA: Insufficient documentation

## 2014-11-22 DIAGNOSIS — D649 Anemia, unspecified: Secondary | ICD-10-CM | POA: Diagnosis not present

## 2014-11-22 DIAGNOSIS — Z8742 Personal history of other diseases of the female genital tract: Secondary | ICD-10-CM | POA: Diagnosis not present

## 2014-11-22 DIAGNOSIS — Z7952 Long term (current) use of systemic steroids: Secondary | ICD-10-CM | POA: Insufficient documentation

## 2014-11-22 DIAGNOSIS — R0989 Other specified symptoms and signs involving the circulatory and respiratory systems: Secondary | ICD-10-CM

## 2014-11-22 DIAGNOSIS — I10 Essential (primary) hypertension: Secondary | ICD-10-CM | POA: Insufficient documentation

## 2014-11-22 DIAGNOSIS — Z79899 Other long term (current) drug therapy: Secondary | ICD-10-CM | POA: Diagnosis not present

## 2014-11-22 DIAGNOSIS — Z8639 Personal history of other endocrine, nutritional and metabolic disease: Secondary | ICD-10-CM | POA: Insufficient documentation

## 2014-11-22 DIAGNOSIS — M1990 Unspecified osteoarthritis, unspecified site: Secondary | ICD-10-CM | POA: Diagnosis not present

## 2014-11-22 DIAGNOSIS — Z7982 Long term (current) use of aspirin: Secondary | ICD-10-CM | POA: Insufficient documentation

## 2014-11-22 DIAGNOSIS — R609 Edema, unspecified: Secondary | ICD-10-CM | POA: Diagnosis not present

## 2014-11-22 DIAGNOSIS — R410 Disorientation, unspecified: Secondary | ICD-10-CM | POA: Diagnosis not present

## 2014-11-22 DIAGNOSIS — R0602 Shortness of breath: Secondary | ICD-10-CM | POA: Diagnosis present

## 2014-11-22 DIAGNOSIS — Z87440 Personal history of urinary (tract) infections: Secondary | ICD-10-CM | POA: Insufficient documentation

## 2014-11-22 LAB — CBC WITH DIFFERENTIAL/PLATELET
BASOS PCT: 0 % (ref 0–1)
Basophils Absolute: 0 10*3/uL (ref 0.0–0.1)
Eosinophils Absolute: 0.1 10*3/uL (ref 0.0–0.7)
Eosinophils Relative: 1 % (ref 0–5)
HCT: 46 % (ref 36.0–46.0)
Hemoglobin: 14.4 g/dL (ref 12.0–15.0)
LYMPHS ABS: 2.3 10*3/uL (ref 0.7–4.0)
LYMPHS PCT: 27 % (ref 12–46)
MCH: 29.6 pg (ref 26.0–34.0)
MCHC: 31.3 g/dL (ref 30.0–36.0)
MCV: 94.7 fL (ref 78.0–100.0)
MONO ABS: 0.8 10*3/uL (ref 0.1–1.0)
Monocytes Relative: 10 % (ref 3–12)
NEUTROS ABS: 5.4 10*3/uL (ref 1.7–7.7)
NEUTROS PCT: 62 % (ref 43–77)
Platelets: 198 10*3/uL (ref 150–400)
RBC: 4.86 MIL/uL (ref 3.87–5.11)
RDW: 13.9 % (ref 11.5–15.5)
WBC: 8.6 10*3/uL (ref 4.0–10.5)

## 2014-11-22 LAB — URINALYSIS, ROUTINE W REFLEX MICROSCOPIC
BILIRUBIN URINE: NEGATIVE
Glucose, UA: NEGATIVE mg/dL
HGB URINE DIPSTICK: NEGATIVE
Ketones, ur: NEGATIVE mg/dL
Leukocytes, UA: NEGATIVE
Nitrite: NEGATIVE
PH: 7 (ref 5.0–8.0)
Protein, ur: NEGATIVE mg/dL
SPECIFIC GRAVITY, URINE: 1.01 (ref 1.005–1.030)
UROBILINOGEN UA: 1 mg/dL (ref 0.0–1.0)

## 2014-11-22 LAB — BASIC METABOLIC PANEL
Anion gap: 5 (ref 5–15)
BUN: 17 mg/dL (ref 6–23)
CALCIUM: 9.7 mg/dL (ref 8.4–10.5)
CO2: 31 mmol/L (ref 19–32)
CREATININE: 0.95 mg/dL (ref 0.50–1.10)
Chloride: 102 mEq/L (ref 96–112)
GFR, EST AFRICAN AMERICAN: 60 mL/min — AB (ref >90)
GFR, EST NON AFRICAN AMERICAN: 52 mL/min — AB (ref >90)
GLUCOSE: 103 mg/dL — AB (ref 70–99)
Potassium: 4.1 mmol/L (ref 3.5–5.1)
Sodium: 138 mmol/L (ref 135–145)

## 2014-11-22 LAB — BRAIN NATRIURETIC PEPTIDE: B Natriuretic Peptide: 66 pg/mL (ref 0.0–100.0)

## 2014-11-22 LAB — TROPONIN I: Troponin I: <0.03 ng/mL (ref <0.031)

## 2014-11-22 NOTE — Discharge Instructions (Signed)
Choking Choking occurs when a food or object gets stuck in the throat or trachea, blocking the airway. If the airway is partly blocked, coughing will usually cause the food or object to come out. If the airway is completely blocked, immediate action is needed to help it come out. A complete airway blockage is life threatening because it causes breathing to stop. Choking is a true medical emergency that requires fast, appropriate action by anyone available. SIGNS OF AIRWAY BLOCKAGE There is a partial airway blockage if you or the person who is choking is:   Able to breathe and speak.  Coughing loudly.  Making loud noises. There is a complete airway blockage if you or the person who is choking is:   Unable to breathe.  Making soft or high-pitched sounds while breathing.  Unable to cough or coughing weakly, ineffectively, or silently.  Unable to cry, speak, or make sounds.  Turning blue.  Holding the neck with both arms. This is the universal sign of choking. WHAT TO DO IF CHOKING OCCURS If there is a partial airway blockage, allow coughing to clear the airway. Do not try to drink until the food or object comes out. If someone else has a partial airway blockage, do not interfere. Stay with him or her and watch for signs of complete airway blockage until the food or object comes out.  If there is a complete airway blockage or if there is a partial airway blockage and the food or object does not come out, perform abdominal thrusts (also referred to as the Heimlich maneuver). Abdominal thrusts are used to create an artificial cough to try to clear the airway. Performing abdominal thrusts is part of a series of steps that should be done to help someone who is choking. Abdominal thrusts are usually done by someone else, but if you are alone, you can perform abdominal thrusts on yourself. Follow the procedure below that best fits your situation.  IF SOMEONE ELSE IS CHOKING: For a conscious adult:     Ask the person whether he or she is choking. If the person nods, continue to step 2.  Stand or kneel behind the person and lean him or her forward slightly.  Make a fist with 1 hand, put your arms around the person, and grasp your fist with your other hand. Place the thumb side of your fist in the person's abdomen, just below the ribs.  Press inward and upward with both hands.  Repeat this maneuver until the object comes out and the person is able to breathe or until the person loses consciousness. For an unconscious adult:  Shout for help. If someone responds, have him or her call local emergency services (911 in [place] If no one responds, call local emergency services yourself if possible.  Begin CPR, starting with compressions. Every time you open the airway to give rescue breaths, open the person's mouth. If you can see the food or object and it can be easily pulled out, remove it with your fingers.  After 5 cycles or 2 minutes of CPR, call local emergency services (911 in [place] if you or someone else did not already call. For a conscious adult who is obese or in the later stages of pregnancy: Abdominal thrusts may not be effective when helping people who are in the later stages of pregnancy or who are obese. In these instances, chest thrusts can be used.   Ask the person whether he or she is choking. If the person  nods and has signs of complete airway blockage, continue to step 2.  Stand behind the person and wrap your arms around his or her chest (with your arms under the person's armpits).  Make a fist with 1 hand. Place the thumb side of your fist on the middle of the person's breastbone.  Grab your fist with your other hand and thrust backward. Continue this until the object comes out or until the person becomes unconscious. For an unconscious adult who is obese or in the later stages of pregnancy:   Shout for help. If someone responds, have him or her call local emergency  services (911 in [place] If no one responds, call local emergency services yourself if possible.  Begin CPR, starting with compressions. Every time you open the airway to give rescue breaths, open the person's mouth. If you can see the food or object and it can be easily pulled out, remove it with your fingers.  After 5 cycles or 2 minutes of CPR, call local emergency services (911 in [place] if you or someone else did not already call. Note that abdominal thrusts (below the rib cage) should be used for a pregnant woman if possible. This should be possible until the later stages of pregnancy when there is no longer enough room between the enlarging uterus and the rib cage to perform the maneuver. At that point, chest thrusts must be used as described. IF YOU ARE CHOKING: 1. Call local emergency services (911 in [place] if near a landline. Do not worry about communicating what is happening. Do not hang up the phone. Someone may be sent to help you anyway. 2. Make a fist with 1 hand. Put the thumb side of the fist against your stomach, just above the belly button and well below the breastbone. If you are pregnant or obese, put your fist on your chest instead, just below the breastbone and just above your lowest ribs. 3. Hold your fist with your other hand and bend over a hard surface, such as a table or chair. 4. Forcefully push your fist in and up. 5. Continue to do this until the food or object comes out. PREVENTION  To be prepared if choking occurs, learn how to correctly perform abdominal thrusts and give CPR by taking a certified first-aid training course.  SEEK IMMEDIATE MEDICAL CARE IF:  You have a fever after choking stops.  You have problems breathing after choking stops.  You received the Heimlich maneuver. MAKE SURE YOU:  Understand these instructions.  Watch your condition.  Get help right away if you are not doing well or get worse. Document Released: 12/13/2004 Document Revised:  03/22/2014 Document Reviewed: 06/17/2012 [facility] Patient Information 2015 [place], [place]. This information is not intended to replace advice given to you by your health care provider. Make sure you discuss any questions you have with your health care provider. Dysphagia Swallowing problems (dysphagia) occur when solids and liquids seem to stick in your throat on the way down to your stomach, or the food takes longer to get to the stomach. Other symptoms include regurgitating food, noises coming from the throat, chest discomfort with swallowing, and a feeling of fullness or the feeling of something being stuck in your throat when swallowing. When blockage in your throat is complete, it may be associated with drooling. CAUSES  Problems with swallowing may occur because of problems with the muscles. The food cannot be propelled in the usual manner into your stomach. You may have ulcers,  scar tissue, or inflammation in the tube down which food travels from your mouth to your stomach (esophagus), which blocks food from passing normally into the stomach. Causes of inflammation include:  Acid reflux from your stomach into your esophagus.  Infection.  Radiation treatment for cancer.  Medicines taken without enough fluids to wash them down into your stomach. You may have nerve problems that prevent signals from being sent to the muscles of your esophagus to contract and move your food down to your stomach. Globus pharyngeus is a relatively common problem in which there is a sense of an obstruction or difficulty in swallowing, without any physical abnormalities of the swallowing passages being found. This problem usually improves over time with reassurance and testing to rule out other causes. DIAGNOSIS Dysphagia can be diagnosed and its cause can be determined by tests in which you swallow a white substance that helps illuminate the inside of your throat (contrast medium) while X-rays are taken. Sometimes a  flexible telescope that is inserted down your throat (endoscopy) to look at your esophagus and stomach is used. TREATMENT   If the dysphagia is caused by acid reflux or infection, medicines may be used.  If the dysphagia is caused by problems with your swallowing muscles, swallowing therapy may be used to help you strengthen your swallowing muscles.  If the dysphagia is caused by a blockage or mass, procedures to remove the blockage may be done. HOME CARE INSTRUCTIONS  Try to eat soft food that is easier to swallow and check your weight on a daily basis to be sure that it is not decreasing.  Be sure to drink liquids when sitting upright (not lying down). SEEK MEDICAL CARE IF:  You are losing weight because you are unable to swallow.  You are coughing when you drink liquids (aspiration).  You are coughing up partially digested food. SEEK IMMEDIATE MEDICAL CARE IF:  You are unable to swallow your own saliva .  You are having shortness of breath or a fever, or both.  You have a hoarse voice along with difficulty swallowing. MAKE SURE YOU:  Understand these instructions.  Will watch your condition.  Will get help right away if you are not doing well or get worse. Document Released: 11/02/2000 Document Revised: 03/22/2014 Document Reviewed: 04/24/2013 [facility] Patient Information 2015 [place], [place]. This information is not intended to replace advice given to you by your health care provider. Make sure you discuss any questions you have with your health care provider.

## 2014-11-22 NOTE — ED Notes (Signed)
Pt and pt's family refused EKG due to allergy to EKG leads.

## 2014-11-22 NOTE — ED Notes (Signed)
Pt and patient's family member refuses to have EKG stickers placed on chest. States patient is allergic, has skin reaction, and that in the past patient has had EKG performed with something else. RN will investigate options.

## 2014-11-22 NOTE — ED Notes (Signed)
Pt c/o SOB, thickened saliva in throat, pt appears mildly confused, found wandering in waiting room, attempting to open door into triage area, when questioned, pt unable to tell nurse where she was trying to go. States "wherever this will take me," showing RN her inhaler.

## 2014-11-22 NOTE — ED Provider Notes (Signed)
CSN: [id number]     Arrival date & time 11/22/14  1357 History   First MD Initiated Contact with Patient 11/22/14 1620     Chief Complaint  Patient presents with  . Shortness of Breath  . Altered Mental Status    Patient is a 81 y.o. female presenting with shortness of breath and altered mental status. The history is provided by the patient and a relative. No language interpreter was used.  Shortness of Breath Altered Mental Status  Ms. Leggitt presents for an episode of SOB/choking spell.  She has a long history of difficulty swallowing and choking at times.  She had a barium swallow performed that show she needs to avoid certain types of food - soft diet. Today she was at home alone and ate part of a candy bar that she shouldn't have had and had a choking spell with shortness of breath.  Her sxs were severe at the time and she called her daughter stating she couldn't breathe.  She was mildly confused at the time.  Sxs have improved since waiting in the department.  She reports feeling a little hoarse.  She has been recently diagnosed with bullous pemphigoid and is on chronic steroids for the last month.  Since starting these she has had increased lower extremity edema.  Daughter is concerned about mother's confusion and choosing the wrong foods to eat.   Past Medical History  Diagnosis Date  . Shortness of breath   . Unspecified essential hypertension   . Right bundle branch block   . Other and unspecified hyperlipidemia   . Other abnormal glucose   . Diverticulosis of colon (without mention of hemorrhage)   . Pyelonephritis, unspecified   . Solitary cyst of breast   . Osteoarthrosis, unspecified whether generalized or localized, unspecified site   . Lumbago   . Spinal stenosis, unspecified region other than cervical   . Anemia, unspecified   . GERD (gastroesophageal reflux disease)   . Pyelonephritis   . DJD (degenerative joint disease)   . UTI (lower urinary tract infection)     Past Surgical History  Procedure Laterality Date  . Cataract extraction    . Right hip hemiarthroplasty      total  . Conversion to right thr     Family History  Problem Relation Age of Onset  . Cancer Brother   . Cancer Brother    History  Substance Use Topics  . Smoking status: Never Smoker   . Smokeless tobacco: Never Used  . Alcohol Use: No   OB History    No data available     Review of Systems  Respiratory: Positive for shortness of breath.   All other systems reviewed and are negative.     Allergies  Azithromycin; Ciprofloxacin; Levofloxacin; and Latex  Home Medications   Prior to Admission medications   Medication Sig Start Date End Date Taking? Authorizing Provider  ADVAIR DISKUS 100-50 MCG/DOSE AEPB Inhale 1 puff into the lungs as needed. 08/07/14   Historical Provider, MD  albuterol (PROVENTIL HFA;VENTOLIN HFA) 108 (90 BASE) MCG/ACT inhaler Inhale 2 puffs into the lungs every 6 (six) hours as needed for wheezing or shortness of breath.    Historical Provider, MD  Alum & Mag Hydroxide-Simeth (MAGIC MOUTHWASH W/LIDOCAINE) SOLN Take 5 mLs by mouth 4 (four) times daily as needed for mouth pain. 11/15/14   Michele Mcalpine, MD  amLODipine (NORVASC) 5 MG tablet Take 1 tablet (5 mg total) by mouth daily. 06/15/14  Michele Mcalpine, MD  aspirin 81 MG tablet Take 81 mg by mouth daily.     Historical Provider, MD  Ca Carbonate-Mag Hydroxide (ROLAIDS PO) Take 1-2 tablets by mouth as needed.    Historical Provider, MD  ferrous sulfate 325 (65 FE) MG tablet Take 325 mg by mouth daily with breakfast.    Historical Provider, MD  hydrochlorothiazide (HYDRODIURIL) 25 MG tablet Take 12.5 mg by mouth daily.    Historical Provider, MD  Menthol, Topical Analgesic, (BIOFREEZE) 4 % GEL Apply 1 application topically daily as needed (pain). As needed    Historical Provider, MD  Misc Natural Products (OSTEO BI-FLEX ADV DOUBLE ST PO) Take 1 tablet by mouth daily.     Historical Provider, MD   Multiple Vitamin (MULTIVITAMIN WITH MINERALS) TABS tablet Take 1 tablet by mouth daily. Centrum Silver    Historical Provider, MD  polyethylene glycol (MIRALAX / GLYCOLAX) packet Take 17 g by mouth daily as needed for mild constipation.     Historical Provider, MD  potassium chloride (K-DUR,KLOR-CON) 10 MEQ tablet Take 1 tablet (10 mEq total) by mouth daily. 11/16/14   Michele Mcalpine, MD  predniSONE (DELTASONE) 10 MG tablet Take 20 mg by mouth daily with breakfast.    Historical Provider, MD   BP 156/69 mmHg  Pulse 73  Temp(Src) 97.4 F (36.3 C) (Oral)  Resp 20  SpO2 99% Physical Exam  Constitutional: She appears well-developed and well-nourished.  HENT:  Head: Normocephalic and atraumatic.  Mouth/Throat: Oropharynx is clear and moist.  Cardiovascular: Normal rate, regular rhythm and normal heart sounds.   Pulmonary/Chest: Effort normal and breath sounds normal. No respiratory distress.  Abdominal: Soft. There is no tenderness. There is no rebound and no guarding.  Musculoskeletal: She exhibits no tenderness.  Nonpitting edema of BLE  Neurological: She is alert.  Oriented to place, mildly confused.  Moves all extremities symmetrically.  Skin: Skin is warm and dry.  Psychiatric: She has a normal mood and affect. Her behavior is normal.  Nursing note and vitals reviewed.   ED Course  Procedures (including critical care time) Labs Review Labs Reviewed  BASIC METABOLIC PANEL - Abnormal; Notable for the following:    Glucose, Bld 103 (*)    GFR calc non Af Amer 52 (*)    GFR calc Af Amer 60 (*)    All other components within normal limits  URINALYSIS, ROUTINE W REFLEX MICROSCOPIC - Abnormal; Notable for the following:    APPearance CLOUDY (*)    All other components within normal limits  CBC WITH DIFFERENTIAL  BRAIN NATRIURETIC PEPTIDE  TROPONIN I    Imaging Review Dg Chest 2 View  11/22/2014   CLINICAL DATA:  Pt c/o increased sob, cough, weakness x 1 day. Pt's family states  pt has dyspnea regularly, chronically, and must "sit on her porch" or "ride around in a car" in order to "catch her breath." Family states she has been referred to cardiology for an echocardiogram to assess for chf.  EXAM: CHEST  2 VIEW  COMPARISON:  04/15/2014.  FINDINGS: The heart remains normal in size. Right diaphragmatic eventration and mild elevation of the right hemidiaphragm with little change. Clear lungs with normal vascularity. No pleural fluid. Thoracic spine degenerative changes, including changes of DISH. Diffuse osteopenia.  IMPRESSION: No acute abnormality.   Electronically Signed   By: Gordan Payment M.D.   On: 11/22/2014 18:09     EKG Interpretation None      MDM  Final diagnoses:  Choking episode    Patient with history of dysphagia and choking spells here for evaluation of shortness of breath and probable choking spell at home. Patient is in no distress at current time. Clinical picture is not consistent with pneumonia, PE, CHF. Patient refuses EKG because she is allergic to the leads. Discussed with patient and family watching diet at home and making sure that she eats foods that she is allowed, also discussed PCP follow-up as well as GI follow-up and return precautions.    Tilden Fossa, MD 11/23/14 [phone]

## 2014-11-22 NOTE — Telephone Encounter (Signed)
SN is aware. 

## 2014-11-23 ENCOUNTER — Ambulatory Visit (HOSPITAL_COMMUNITY): Payer: Medicare Other | Attending: Cardiovascular Disease

## 2014-11-23 DIAGNOSIS — B351 Tinea unguium: Secondary | ICD-10-CM | POA: Insufficient documentation

## 2014-11-23 DIAGNOSIS — F419 Anxiety disorder, unspecified: Secondary | ICD-10-CM | POA: Diagnosis not present

## 2014-11-23 DIAGNOSIS — R609 Edema, unspecified: Secondary | ICD-10-CM | POA: Insufficient documentation

## 2014-11-23 DIAGNOSIS — K5900 Constipation, unspecified: Secondary | ICD-10-CM | POA: Insufficient documentation

## 2014-11-23 DIAGNOSIS — D649 Anemia, unspecified: Secondary | ICD-10-CM | POA: Insufficient documentation

## 2014-11-23 DIAGNOSIS — I10 Essential (primary) hypertension: Secondary | ICD-10-CM | POA: Diagnosis not present

## 2014-11-23 DIAGNOSIS — L120 Bullous pemphigoid: Secondary | ICD-10-CM | POA: Diagnosis not present

## 2014-11-23 DIAGNOSIS — M549 Dorsalgia, unspecified: Secondary | ICD-10-CM | POA: Diagnosis not present

## 2014-11-23 DIAGNOSIS — K5790 Diverticulosis of intestine, part unspecified, without perforation or abscess without bleeding: Secondary | ICD-10-CM | POA: Diagnosis not present

## 2014-11-23 DIAGNOSIS — K219 Gastro-esophageal reflux disease without esophagitis: Secondary | ICD-10-CM | POA: Diagnosis not present

## 2014-11-23 DIAGNOSIS — I4510 Unspecified right bundle-branch block: Secondary | ICD-10-CM | POA: Diagnosis not present

## 2014-11-23 DIAGNOSIS — I872 Venous insufficiency (chronic) (peripheral): Secondary | ICD-10-CM | POA: Insufficient documentation

## 2014-11-23 DIAGNOSIS — E119 Type 2 diabetes mellitus without complications: Secondary | ICD-10-CM | POA: Diagnosis not present

## 2014-11-23 NOTE — Progress Notes (Signed)
2D Echo completed. Patient's daughter expressed concern regarding the use of EKG stickers for the exam because the patient had a severe allergic reaction to the EKG leads.  Spoke with Dr. Eden EmmsNishan about this and he agreed to proceed with the echo without EKG readings on the machine. 11/23/2014

## 2014-12-07 ENCOUNTER — Telehealth: Payer: Self-pay | Admitting: Pulmonary Disease

## 2014-12-07 NOTE — Telephone Encounter (Signed)
[name]- patients daughter came in to drop off fl2 forms. I will place them in SN box.

## 2014-12-07 NOTE — Telephone Encounter (Signed)
Called and spoke to pt's daughter, [name]. [name] stated she is going to drop off forms for SN to complete to allow pt to be accepted to a nursing facility. [name] thinks pt is not able to be on her own anymore as she is becoming more forgetful.   Will forward to Leigh to look out for fl2 forms.

## 2014-12-08 ENCOUNTER — Encounter: Payer: Self-pay | Admitting: Pulmonary Disease

## 2014-12-08 ENCOUNTER — Ambulatory Visit (INDEPENDENT_AMBULATORY_CARE_PROVIDER_SITE_OTHER): Payer: Medicare Other | Admitting: Pulmonary Disease

## 2014-12-08 VITALS — BP 130/80 | HR 66 | Temp 97.1°F | Ht 68.0 in | Wt 169.0 lb

## 2014-12-08 DIAGNOSIS — I4510 Unspecified right bundle-branch block: Secondary | ICD-10-CM

## 2014-12-08 DIAGNOSIS — F419 Anxiety disorder, unspecified: Secondary | ICD-10-CM

## 2014-12-08 DIAGNOSIS — R269 Unspecified abnormalities of gait and mobility: Secondary | ICD-10-CM

## 2014-12-08 DIAGNOSIS — I10 Essential (primary) hypertension: Secondary | ICD-10-CM

## 2014-12-08 DIAGNOSIS — M159 Polyosteoarthritis, unspecified: Secondary | ICD-10-CM

## 2014-12-08 DIAGNOSIS — I872 Venous insufficiency (chronic) (peripheral): Secondary | ICD-10-CM

## 2014-12-08 DIAGNOSIS — K2100 Gastro-esophageal reflux disease with esophagitis, without bleeding: Secondary | ICD-10-CM

## 2014-12-08 DIAGNOSIS — M48061 Spinal stenosis, lumbar region without neurogenic claudication: Secondary | ICD-10-CM

## 2014-12-08 DIAGNOSIS — R21 Rash and other nonspecific skin eruption: Secondary | ICD-10-CM

## 2014-12-08 DIAGNOSIS — R0989 Other specified symptoms and signs involving the circulatory and respiratory systems: Secondary | ICD-10-CM

## 2014-12-08 DIAGNOSIS — M150 Primary generalized (osteo)arthritis: Secondary | ICD-10-CM

## 2014-12-08 DIAGNOSIS — M4806 Spinal stenosis, lumbar region: Secondary | ICD-10-CM

## 2014-12-08 DIAGNOSIS — R0602 Shortness of breath: Secondary | ICD-10-CM

## 2014-12-08 NOTE — Telephone Encounter (Signed)
Pt is here for her appt and FL2 form has been completed.

## 2014-12-09 ENCOUNTER — Encounter: Payer: Self-pay | Admitting: Pulmonary Disease

## 2014-12-09 DIAGNOSIS — R0989 Other specified symptoms and signs involving the circulatory and respiratory systems: Secondary | ICD-10-CM | POA: Insufficient documentation

## 2014-12-09 DIAGNOSIS — R21 Rash and other nonspecific skin eruption: Secondary | ICD-10-CM | POA: Insufficient documentation

## 2014-12-09 NOTE — Progress Notes (Signed)
Subjective:    Patient ID: [name], female    DOB: [birth year], 81 y.o.   MRN: [id number]  HPI 81 y/o WF here for a follow up visit... she has multiple medical problems as noted below...  Followed for general medical purposes w/ hx chr obstructive asthma, HBP, RBBB, Hypercholesterolemia, borderline DM, DJD, LBP w/ sp stenosis, etc... ~  SEE PREV EPIC NOTES FOR THE OLDER DATA >>   ~  February 10, 2013:  70moROV & [name]'s CC is pain & numbness in her legs- very uncomfortable, knees down, feet burning, & feel cold most of the time, awaiting shot in her back from DrRamos; we discussed trial Lyrica59min the interim... We reviewed the following medical problems during today's office visit >>     Chr Obstructive Asthma> stable on Advair100 & Proventil prn; notes breathing at baseline & Prn Klonopin helps dyspnea...    HBP> on Norvasc10, HCTZ25; BP=136/60, tol meds well; denies CP, palpit, ch in SOB, edema, etc...    CHOL> on diet alone, refuses meds, FLP 3/14 shows TChol 177, TG 50, HDL 60, LDL 107    DM> on diet alone, wt stable ~174#, BS=97, last A1c (2/13) was 6.3 & she knows to restrict carbs etc...    GI- Reflux, Divertics, constip> on Protonix40, Miralax, Senakot-S; continue same meds.. [name]  DJD/ LBP> on Celebrex & osteobiflex prn; had right THR 2011; known sp stenosis w/ prev ESI... We reviewed prob list, meds, xrays and labs> see below for updates >>   CXR 2/14 showed normal heart size, clear lungs w/ sl peribronch thickening, DJD in spine, NAD...  LABS 3/14:  FLP- at goals on diet x LDL=107;  Chems- ok x TCO2=36;  CBC- wnl;  TSH=1.93;  UA- clear...  ~  August 17, 2013:  79m979moV & [name] continues to do well at 88;23o new complaints or concerns...    COPD stable on Advair100 & only using it prn she says; no recent resp exac & denies cough, sput, hemoptysis, SOB, CP, etc...    BP controlled on Amlod5 & Hct25-1/2 daily; BP= 130/70 & she denies CP, palpit, dizzy, SOB, edema,  etc...     Chol & BS controlled on diet alone; weight is down 4# to 169# today...    GI is stable on Protonix40, antiireflux regimen, & Miralax...     DJD/ LBP treated w/ Vits, Osteobiflex, Celebrex200 prn & Neurontin100 prn- this is the way she likes it! We reviewed prob list, meds, xrays and labs> see below for updates >> ok 2014 Flu vaccine today...  ~  February 15, 2014:  79mo48mo & [name] recently saw TP w/ incr SOB/DOE walking & Qhs, swelling in legs, etc; CXR & Labs were ok; she changed her Hct to Lasix & improved... We reviewed the following medical problems during today's office visit >>     Chr Obstructive Asthma> stable on Advair100 (but only using it prn) & Proventil prn; notes breathing back to baseline & Prn Klonopin helps dyspnea...    HBP> on ASA81, Norvasc5, Lasix20- 1-2/d; BP=136/70, tol meds well; denies CP, palpit, ch in SOB, but notes persist edema in legs; Rec to ch Amlod5 to Losar50, low sodium, elev legs, Lasix40...    CHOL> on diet alone, refuses meds, FLP 3/14 shows TChol 177, TG 50, HDL 60, LDL 107    DM> on diet alone, wt up slightly to  ~179#, BS=95, last A1c (2/13) was 6.3 & she knows to restrict  carbs etc...    GI- Reflux, Divertics, constip> on Protonix40, Miralax, Senakot-S; continue same meds.[name]    DJD/ LBP> on Celebrex & osteobiflex prn; had right THR 2011; known sp stenosis w/ prev ESI... We reviewed prob list, meds, xrays and labs> see below for updates >>   CXR 3/15 showed norm heart size, clear lungs, elev of right hemidiaph, NAD...  LABS 3/15:  Chems- wnl;  CBC- wnl;  BNP=26...  ~  Apr 14, 2014:  77moROV & add-on appt requested for "indigestion"> difficult hx but pt appears to be c/o swallowing difficulty, dysphagia, heartburn, & indigestion according to the daugh; she's had gas, belching, hoarseness, SOB, and early satiety; also notes constipation & took mag citrate for relief;  Epic review indicates hx indigestion & reflux symptoms- treated by DrPerry w/ PPI, she  had EGD & Colon 2002;  Notes food sticking in mid-esoph w/ pain, nausea despite her Prevacid30 qam...  We decided to increase the PPI- Prevacid30Bid and refer to DrPerry for EGD and dilatation....    She is also c/o incr ankle edema; on Lasix20, no salt, etc;  We discussed increase diuretic to 467mam and re-double efforts at sodium restriction, elevation, support hose, etc...     BP remains stable ooff Amlod, on Losar50, Lasix40;  BP= 138/80 today & she denies angina pain, CHF, etc...  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     She also had some ankle edema while on Lasix20 and was told no salt, incr Lasix to 32m60m& elevate/ support hose/ etc... Once again daughter has changed her meds based on perceived side effects ("causing head spells- I feel my heart in my head"); they stopped the Lasix & she does not want to restart- asking to go back on prev meds= Amlod5 & HCT12.5 daily, OK...    Similarly she as Klonopin 0.5mg 16mtake 1/2 to 1 tab as needed for dyspnea or anxiety, and she tells me she hasn't even tried it one time!  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  I have again recommended that the pt try to  increase her Klonopin from 0.$RemoveBeforeD'5mg'PaZVlAbaAUwmsM$  Qhs to one Bid or perhaps try 1/2 in AM, 1/2 in afternoon, and 1 at bedtime...     Finally the daughter asked about ordering some Physical Therapy for her mother; they feel she is getting weaker esp in legs, slower, and balance is off; we reviewed the need for exercise and we will request PT consult & their involvement We reviewed prob list, meds, xrays and labs> see below for updates >> OK 2015 flu vaccine today  ~  September 28, 2014:  50mo ROV & [name] says "I'm not doing well"; c/o skin rash- daughter thinks it's the EKG tabs (but she has rash on lowe back/ buttock), states "allergic to elastic", she has seen Derm- DrDJones on 3 diff creams (currently on clobetasol0.05% & Eurcin calming lotion); they did Bx= "looked ok, it was neg";  He is apparently considering an immunosuppressant med 7 want her checked for Tb- we will send Quantiferon Gold...     She saw DrJEdwards for GI> nothing avail in EPIC yet but daughter states he did MBS& he rec warm liquids w/ her meals & she is 90% better decr burbs, decr gas, swallowing better; pt stopped Nexium on her own & refuses restart...    On Advair100, AlbutHFA, MMW> all being used just prn & she states breathing is 90% better...    On Amlod5 & HCT12.5; BP= 140/70 w/o CP, palpit, SOB, edema...     She is getting outpt PT now- helping she says...    She is off the Klonopin & refuses to restart this med (daugh states she never  really took the first pill)... We reviewed prob list, meds, xrays and labs> see below for updates >>  PLAN>> we wrote for Atarax $RemoveBe'10mg'kkLgoybiE$  Q6h prn itching; we sent Quantiferon gold- NEG...   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, etc;  She has mult somatic complaints- good days & bad, hard to sort out the salient features, I have prev rec a low dose Klonopin Bid to help w/ this & her dyspnea but daugh states she has never taken it, we discussed this again;  CC is leg pain- difficulty walking, uses cane, known spinal stenosis, she's had PT, she's had shots and encouraged to f/u w/ drRamos about this; not on pain meds "I use natural products";  Also c/o ?dyspnea as before, phlegm in throat, "attacks" of swallowing trouble which we have investigated w/ Ba swallow/ UGI, speech path eval, etc & she is rec to take Nexium 40Bid; daughter states she occas has to drive her around in her car w/ window 1/2 open to get relief (I again rec use of the Klonopin); offered GI eval w/ DrPerry or DrJEdwards & they will decide; daugh notes that they have tried everything for the phlegm in throat including Mucinex, Rock&Rye, Rawleigh's salve, Vicks vaporub, Slippery [place] lozenges, rolaids, warm honey & lemon, MMW; they wonder about food allergy & they are encouraged to f/u w/ GI;  Finally they mention her rash- sm patch on her back over sacrum & she was tried on several creams per DrJones- intol to some of them- still on Prednisone  $'20mg'O$ /d & she needs to wean this off, they have f/u appt in several days...     There have been mult phone calls in the interim> feeling weak, SOB, edema; daugh states she not taking her Advair,  Klonopin, on HCT25-1/2 daily, they didn't want to incr meds but requested Cards eval for "CHF"-ok...    She saw Cherly Hensen 11/17/14 but wouldn't allow EKG due to "allergy to the electrodes"; his note is reviewed- he agreed w/ ARB/diuretic but noted pts refusal & she wanted to be back on low dose Amlod; no signs of CHF- they did 2DEcho showing norm LV size & function w/ EF=65-70%, AoV leaflets mildly thickened w/o AS, MV leaflets mod thickened w/ trivMR, mild RA dil, PAsys=86mmHg; no change in meds...     She went to the ED 11/22/14 w/ a choking episode, SOB, altered mental status> note reviewed- all symptoms resolved spont; they did CXR- NAD; and Blood work- wnl...    Derm eval DrDJones w/ Dx of bullous pemphigoid- on Pred $Remov'20mg'XkmBHf$ /d & they understand that this can contrib to edema; advised 2gm Na diet etc; they do not want stronger diuretic...  We reviewed prob list, meds, xrays and labs> see below for updates >>   CXR 1/16 showed norm heart size, clear lungs, mild right diaph eventration- no change, Tspine DJD. DISH/ osteopenia; NAD...   2DEcho 1/16 showed norm LV size & function w/ EF=65-70%, AoV leaflets mildly thickened w/o AS, MV leaflets mod thickened w/ trivMR, mild RA dil, PAsys=71mmHg...  LABS 1/16:  Chems- wnl w/ Cr=0.95;  BNP=66;  CBC- wnl w/ Hg=14.4.Marland KitchenMarland Kitchen          Problem List:       DYSPNEA (ICD-786.05) - long hx of chronic obstructive asthma treated w/ ADVAIR100Bid & PROAIR (she uses them Prn now)... she is a non-smoker w/ some reactive airways disease in the past & retired from [employer] after 33 years in 1991... she denies cough, sputum, hemoptysis, worsening dyspnea, wheezing, chest pains, snoring, daytime hypersomnolence, etc...  ~  baseline CXR w/o acute changes...  ~  PFT's 5/02 w/ FVC 2.07 (68%), FEV1=1.36 (57%), and FEV1/FVC ratio=66%, mid-flows 42%... ~  CT Angio 7/10 was neg- x biapical pleuroparenchymal scarring... ~  CXR 10/11 showed sl elev right hemidaiph, mild DJD sp,  osteopenia, NAD.[name]. ~  CXR 6/12 showed mild apical scarring, clear & NAD, DJD sp w/ osteophytes... ~  Intermittent dyspnea more related to anxiety & treated w/ KLONOPIN 0.$RemoveBefor'5mg'kfVlafbOZwYn$  1/2 to 1 tab Bid==> improved. ~  CXR 4/13 showed normal heart size, clear lungs, DJD in TSpine... ~  CXR 2/14 showed normal heart size, clear lungs w/ sl peribronch thickening, DJD in spine, NAD.[name]. ~  CXR 3/15 showed norm heart size, clear lungs, elev of right hemidiaph, NAD... ~  She is encouraged to take the Klonopin 0.$RemoveBefore'5mg'jKfkDCLpnDNog$  Bid regularly- consider taking 1/2 in AM, 1/2 in afternoon, one at bedtime... ~  1/16: she continues w/ mult somatic complaints and intermittent choking episodes etc; she refuses to take the Advair or Klonopin... ~  CXR 1/16 showed norm heart size, clear lungs, mild right diaph eventration- no change, Tspine DJD. DISH/ osteopenia; NAD...  HYPERTENSION (ICD-401.9) - controlled on NORVASC $RemoveBe'5mg'aCqWXZgCn$  daily & HCTZ $Remo'25mg'CyvPb$ tab daily...  ~  2/13:  BP 144/70 today> tol rx well & denies HA, visual changes, CP, palipit, dizziness, syncope, edema, etc... ~  4/13:  BP= 148/80 & she denies CP, palpit, edema; dyspnea improved w/ Klonopin. ~  6/13:  BP= 132/68 & as  noted she has mult somatic complaints... ~  3/14:  on Norvasc10, HCTZ25; BP=136/60, tol meds well; denies CP, palpit, ch in SOB, edema, etc  ~  9/14:  BP controlled on Amlod5 & Hct25-1/2 daily; BP= 130/70 & she denies CP, palpit, dizzy, SOB, edema, etc. ~  3/15: on ASA81, Norvasc5, Lasix20- 1-2/d; BP=136/70, tol meds well; denies CP, palpit, ch in SOB, but notes persist edema in legs; Rec to ch Amlod5 to Losar50, low sodium, elev legs, Lasix40. ~  5/15: on Losar50, Lasix40;  BP= 138/60 & she denies angina pain, palpit, ch in SOB, etc... ~  7/15: on Losar50, she stopped Lasix; BP= 138/78 and they want to go back on Amlod5 + HCT12.5 daily... ~  9/15: on Amlod5, Hct12.5; BP= 128/80 & she likes this combo better- denies CP, palpit, etc... ~  1/16: on Amlod5, Hct12.5 but  ?what she is taking; BP= 130/80 & she has refused the ARBs...  RIGHT BUNDLE BRANCH BLOCK (ICD-426.4) - on ASA $Remo'81mg'LSlou$ /d... baseline EKG w/ RBBB and 2DEcho 5/02 showed mild asymmetric LVH w/ incr EF... ~  12/15: she had Cards eval by DrNishan> he rec ARB/diuretic but she refused to switch from her Amlod5/ Hct12.5 regimen; she refused EKG due to "allergy to the electrodes"... ~  2DEcho 1/16 showed norm LV size & function w/ EF=65-70%, AoV leaflets mildly thickened w/o AS, MV leaflets mod thickened w/ trivMR, mild RA dil, PAsys=27mmHg...  VENOUS INSUFFIC & EDEMA >>  ~  3/15: she was switched off Amlod & onto Losar50, Lasix20=>40, plus no salt, elevation, support hose, etc...  ~  9/15: they preferred the [facility] & HCT12.5 regimen; she has VI & 1+edema but stable, no acute changes... 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  HYPERLIPIDEMIA (ICD-272.4) - on diet alone... she forgets to come to visits FASTING for this blood work ~  [place] 8/07 showed TChol 205, TG 71, HDL 49, LDL 130... ~  FLP 6/12 on diet alone showed TChol 218, TG 50, HDL 66, LDL 129 ~  FLP 2/13 on diet alone showed TChol 171, TG 43, HDL 66, LDL 97 ~  FLP 3/14 on diet alone showed TChol 177, TG 50, HDL 60, LDL 107 ~  She needs to ret FASTING for f/u FLP...  DIABETES MELLITUS, BORDERLINE (ICD-790.29) - on diet alone w/ prev BS's in the 100-160 range... ~  labs in 2008-9 showed BS= 101 to 108 ~  labs 1/10 showed BS= 106, A1c= 5.9 ~  Labs 6/12 showed BS= 93, A1c= 6.6.Marland KitchenMarland Kitchen rec diet, exercise... ~  Labs 2/13 showed BS= 92, A1c= 6.3 ~  3/14: on diet alone, wt stable ~174#, BS=97, last A1c (2/13) was 6.3 & she knows to restrict carbs etc. ~  Labs 3/15 showed BS= 95; and BS= 120 in [id number]...   INDIGESTION/ REFLUX SYMPTOMS >> see 10/12 note & PROTONIX $RemoveBe'40mg'RLfPiWlWI$ /d started, further eval if symptoms persist... ESOPHAGEAL DYSMOTILITY/ PRESBYESOPHAGUS >>  ~  4/13:  She notes some reflux symptoms and  excess gas w/ belching; rec to take the Protonix daily & Simethacone vs Tums which she says helps her gas. ~  5/13:  She saw GI DrPerry w/ rec to take Prilosec for her indigestion... ~  5/15:  She presented w/ worsening indigestion, dysphagia, reflux & Prev30 was incr to Bid w/ GI f/u suggested for EGD... 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 ~  9/15:  Symptoms persist but she is not regurg or vomiting, and weight stable; they want to change to Nexium40Bid-OK, and rec proceed w/ MBS by speech path... ~  She continues to have intermit choking episodes and c/o phlegm in her throat; she has tried "everything" & encouraged to f/u w/ GI- DrPerry/ DrJEdwards...  DIVERTICULOSIS OF COLON (ICD-562.10) - she takes SENAKOT-S, MIRALAX, Peppermint Tea, & sauerkraut Prn...last colonoscopy 9/02 by DrPerry was WNL...  PYELONEPHRITIS (ICD-590.80) - SEE 1/09 Hospitalization (reviewed)... ~  She saw DrMacDiarmid for her recurrent UTIs, chronic cystitis, urge & stress incont, nocturia; she is INTOL to [place]...  Hx of BREAST CYST (ICD-610.0)  DEGENERATIVE JOINT DISEASE (ICD-715.90) - s/p right hip hemiarthroplasty 11/09 by DrAplington w/ wound complic... then dx w/ loosening of the femoral shaft & had conversion to right THR by DrAlusio 10/11 & much improved... she uses CELEBREX $RemoveBefor'200mg'GLfSoPvqQYmr$  Prn (seldom takes this).  LOW BACK PAIN SYNDROME (ICD-724.2) & SPINAL STENOSIS (ICD-724.00) - severe LBP & spinal stenosis w/ evals by DrRamos & DrNudelman... s/p shots, considering poss surgery vs alternative therapies... she takes Celebrex, Osteobiflex, MVI, Vit D... ~  8/10: eval by DrAplington- diff leg lengths, lift  placed in right shoe, then trial Lyrica$RemoveBeforeDE'50mg'croGgMSimARHqze$ ... ~  12/11:  improved after hip revision surg (to THR) 10/11 w/ better ambulaton... ~  5/13:  DrRamos gave her another ESI for her leg pain related to sp stenosis... ~  3/14:  C/o neuropathic discomfort in legs- eval by Ramos & offered shots in her back; try Lyrica50 in the interim... ~  Persistent leg pain, on OTC "natural" meds she says; encouraged to f/u w/ DrRamos et al...  Hx of ANEMIA (ICD-285.9) - eval by GI in 2002 showed normal EGD and Colon... prob iron malabsorption problem Rx'd w/ Fe infusion... ~  labs 1/10 showed Hg= 14.6, MCV= 89, Fe= 94 ~  labs 12/11 showed Hg= 12.8, MCV= 90, Fe= 33... try Fe supplement + VitC... ~  Labs 6/12 showed Hg= 15.0 ~  Labs 2/13 showed Hg= 14.6 ~  Labs 2/14 showed Hg= 14.8 ~  Labs 3/15 showed Hg= 14.9 ~  Labs 1/16 showed Hg= 14.4  DERM:  rash Rx'd by dermatology- OLUX-E foam= clobetasol Foam 0.05%... 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   Past Surgical History  Procedure Laterality Date  . Cataract extraction    . Right hip hemiarthroplasty      total  . Conversion to right thr      Outpatient Encounter Prescriptions as of 12/08/2014  Medication Sig  . ADVAIR DISKUS 100-50 MCG/DOSE AEPB Inhale 1 puff into the lungs as needed (shortness of breath).   [name] albuterol (PROVENTIL HFA;VENTOLIN HFA) 108 (90 BASE) MCG/ACT inhaler Inhale 2 puffs into the lungs every 6 (six) hours as needed for wheezing or shortness of breath.  . Alum & Mag Hydroxide-Simeth (MAGIC MOUTHWASH W/LIDOCAINE) SOLN Take 5 mLs by mouth 4 (four) times daily as needed for mouth pain.  [name] amLODipine (NORVASC) 5 MG tablet Take 1 tablet (5 mg total) by mouth daily.  [name] aspirin 81 MG tablet Take 81 mg by mouth 2 (two) times daily.   [name]  Ca Carbonate-Mag Hydroxide (ROLAIDS PO) Take 1-2 tablets by mouth as needed (reflux).   . ferrous sulfate 325 (65 FE) MG tablet Take 325 mg by  mouth daily with breakfast.  . hydrochlorothiazide (HYDRODIURIL) 25 MG tablet Take 12.5 mg by mouth daily.  . Menthol, Topical Analgesic, (BIOFREEZE) 4 % GEL Apply 1 application topically daily as needed (pain).   . Misc Natural Products (OSTEO BI-FLEX ADV DOUBLE ST PO) Take 1 tablet by mouth daily.   . Multiple Vitamin (MULTIVITAMIN WITH MINERALS) TABS tablet Take 1 tablet by mouth daily. Centrum Silver  . polyethylene glycol (MIRALAX / GLYCOLAX) packet Take 17 g by mouth daily as needed for mild constipation.   . potassium chloride (K-DUR,KLOR-CON) 10 MEQ tablet Take 1 tablet (10 mEq total) by mouth daily.  . predniSONE (DELTASONE) 20 MG tablet Take 20 mg by mouth daily with breakfast.    Allergies  Allergen Reactions  . Azithromycin Shortness Of Breath    Trouble breathing  . Ciprofloxacin Other (See Comments)    REACTION: hallucinations  . Levofloxacin Other (See Comments)    Insomnia, indigestion, tingling sensation in legs  . Latex Rash  . Other Rash    EKG leads caused a rash that required steroids to clear    Current Medications, Allergies, Past Medical History, Past Surgical History, Family History, and Social History were reviewed in [employer] record.    Review of Systems         See HPI - all other systems neg except as noted... The patient complains of decreased hearing, dyspnea on exertion, muscle weakness, and difficulty walking.  The patient denies anorexia, fever, weight loss, weight gain, vision loss, hoarseness, chest pain, syncope, peripheral edema, prolonged cough, headaches, hemoptysis, abdominal pain, melena, hematochezia, severe indigestion/heartburn, hematuria, incontinence, suspicious skin lesions, transient blindness, depression, unusual weight change, abnormal bleeding, enlarged lymph nodes, and angioedema.     Objective:   Physical Exam     WD, WN, Chr ill appearing 81 y/o WF in NAD... GENERAL:  Alert & oriented; pleasant &  cooperative... HEENT:  [place]/AT, EOM-full, EACs-clear, TMs-wnl, NOSE-clear, THROAT-clear & wnl. NECK:  Supple w/ fairROM; no JVD; normal carotid impulses w/o bruits; no thyromegaly or nodules palpated; no lymphadenopathy. CHEST:  Clear to P & A; without wheezes/ rales/ or rhonchi heard... HEART:  Regular Rhythm; without murmurs/ rubs/ or gallops detected... ABDOMEN:  Soft & nontender; normal bowel sounds; no organomegaly or masses palpated... EXT:  mod arthritic changes, walks w/ cane, +venous insuffic & tr edema., scattered varicose veins... NEURO:  CN's intact; motor testing normal; no focal deficits... DERM:   mild intertrig rash under breast, & onychomycosis of toenails...  RADIOLOGY DATA:  Reviewed in the EPIC EMR & discussed w/ the patient...  LABORATORY DATA:  Reviewed in the EPIC EMR & discussed w/ the patient...   Assessment & Plan:    Episodes of choking & SOB>> see above, she has been resistent to trying any of the meds & treatment suggestions that we have given to her...    DYSPNEA>  Hx asthma, stable off Advair, on Proair prn; hx anxiety component on Klonopin but she is not using!  HBP>  Controlled on Amlod5 + HCT12.5; continue same...  RBBB>  Aware & denies CP, palpit, ch in [place], etc... She refuses f/u EKG since she thinks that she is allergic to electrodes...  CHOL>  On diet alone & FLP looks reasonable;  We reviewed low chol, low fat diet...  DM>  BS= 95 & A1c is 6.3 when last checked;  on diet alone & we reviewed low carb no sweets etc...  GI> Indigestion, Divertics> she notes most bowel symptoms resolved off spicey foods;  UGI symptoms w/ dysphagia, food sticking, belch/gas/ etc have not resolved on Protonix40Bid and after GI consult w/ DrPerry; we reviewed her UGI series results and decided to proceed w/ MBS by Speech Path; transiently improved then sought 2nd opinion from DrJEdwards=> on Nexium40Bid...  UTI>  Klebsiella UTI resolved after Septra Rx... She has been  eval by DrMacDiarmid.  DJD, LBP, Spinal Stenosis>  Prev evals by Ortho, DrRamos, DrNudelman etc; improved after THR w/ better ambulation; c/o neuropathic discomfort in legs- she will f/u w/ Ramos for shots, using OTC "natural" meds...  Anxiety>  If she would take the Klonopin, I feel it would help...   Patient's Medications  New Prescriptions   No medications on file  Previous Medications   ADVAIR DISKUS 100-50 MCG/DOSE AEPB    Inhale 1 puff into the lungs as needed (shortness of breath).    ALBUTEROL (PROVENTIL HFA;VENTOLIN HFA) 108 (90 BASE) MCG/ACT INHALER    Inhale 2 puffs into the lungs every 6 (six) hours as needed for wheezing or shortness of breath.   ALUM & MAG HYDROXIDE-SIMETH (MAGIC MOUTHWASH W/LIDOCAINE) SOLN    Take 5 mLs by mouth 4 (four) times daily as needed for mouth pain.   AMLODIPINE (NORVASC) 5 MG TABLET    Take 1 tablet (5 mg total) by mouth daily.   ASPIRIN 81 MG TABLET    Take 81 mg by mouth 2 (two) times daily.    CA CARBONATE-MAG HYDROXIDE (ROLAIDS PO)    Take 1-2 tablets by mouth as needed (reflux).    FERROUS SULFATE 325 (65 FE) MG TABLET    Take 325 mg by mouth daily with breakfast.   HYDROCHLOROTHIAZIDE (HYDRODIURIL) 25 MG TABLET    Take 12.5 mg by mouth daily.   MENTHOL, TOPICAL ANALGESIC, (BIOFREEZE) 4 % GEL    Apply 1 application topically daily as needed (pain).    MISC NATURAL PRODUCTS (OSTEO BI-FLEX ADV DOUBLE ST PO)    Take 1 tablet by mouth daily.    MULTIPLE VITAMIN (MULTIVITAMIN WITH MINERALS) TABS TABLET    Take 1 tablet by mouth daily. Centrum Silver   POLYETHYLENE GLYCOL (MIRALAX / GLYCOLAX) PACKET    Take 17 g by mouth daily as needed for mild constipation.    POTASSIUM CHLORIDE (K-DUR,KLOR-CON) 10 MEQ TABLET    Take 1 tablet (10 mEq total) by mouth daily.   PREDNISONE (DELTASONE) 20 MG TABLET    Take 20 mg by mouth daily with breakfast.  Modified Medications   No medications on file  Discontinued Medications   No medications on file

## 2014-12-13 ENCOUNTER — Other Ambulatory Visit: Payer: Medicare Other

## 2014-12-17 ENCOUNTER — Telehealth: Payer: Self-pay | Admitting: Pulmonary Disease

## 2014-12-17 NOTE — Telephone Encounter (Signed)
[id number] x1 

## 2014-12-20 ENCOUNTER — Other Ambulatory Visit (INDEPENDENT_AMBULATORY_CARE_PROVIDER_SITE_OTHER): Payer: Medicare Other | Admitting: *Deleted

## 2014-12-20 ENCOUNTER — Telehealth: Payer: Self-pay | Admitting: Cardiovascular Disease

## 2014-12-20 DIAGNOSIS — E876 Hypokalemia: Secondary | ICD-10-CM

## 2014-12-20 LAB — BASIC METABOLIC PANEL
BUN: 15 mg/dL (ref 6–23)
CALCIUM: 9.6 mg/dL (ref 8.4–10.5)
CHLORIDE: 99 meq/L (ref 96–112)
CO2: 34 mEq/L — ABNORMAL HIGH (ref 19–32)
Creatinine, Ser: 0.94 mg/dL (ref 0.40–1.20)
GFR: 59.48 mL/min — AB (ref >60.00)
GLUCOSE: 135 mg/dL — AB (ref 70–99)
POTASSIUM: 4.2 meq/L (ref 3.5–5.1)
SODIUM: 138 meq/L (ref 135–145)

## 2014-12-20 NOTE — Telephone Encounter (Signed)
Walk-In Patient Form received on 2.1.2016: Placed in Dr. Fabio BeringNishan's box: djc

## 2014-12-20 NOTE — Addendum Note (Signed)
Addended by: Tonita PhoenixBOWDEN, ROBIN K on: 12/20/2014 12:01 PM   Modules accepted: Orders

## 2014-12-20 NOTE — Telephone Encounter (Signed)
Per SN---  Yes ok to take the probiotic.  thanks

## 2014-12-20 NOTE — Telephone Encounter (Signed)
Spoke with patient's daughter, she is aware of the ok to proceed taking the probiotic.  Wants SN to address the mucus.  States that pt has a lot of clear mucus that she is choking on.  Pt's daughter wants to know if there is something that can be taken to help combat this.    Also wants to know if there is anything they should be taking in regards to the patient's COPD dx.    Aware that this will be addressed tomorrow.  Dr. Kriste BasqueNadel please advise.  Thanks!!

## 2014-12-20 NOTE — Telephone Encounter (Signed)
Pt daughter returned call [phone] On AVS, states that pt has COPD. would like to know if there is something she could be taking for that.

## 2014-12-20 NOTE — Telephone Encounter (Signed)
Spoke with daughter-she wants to make sure that its okay for patient to take probiotic daily as well as what other recs SN has to help with increased phlegm(daughter states this is due to COPD) patient has-clear in color-unable to get it up much as she is not active enough.

## 2014-12-21 ENCOUNTER — Telehealth: Payer: Self-pay | Admitting: *Deleted

## 2014-12-21 NOTE — Telephone Encounter (Signed)
Per SN: The only thing for mucus of Mucinex 600 mg 1 po 4 times a day with plenty of water.  And the Mucinex will be used for the COPD also. Spoke with pt's daughter and advised of Dr Jodelle GreenNadel's recommendations.  She also said as an FYI for Dr Kriste BasqueNadel that pt's dermatologist is putting her on some form of chemo along with the Prednisone to treat the rash.

## 2014-12-21 NOTE — Telephone Encounter (Signed)
Normal echo          ----- Message -----     From: Alois Clichehristine E Ellis Mehaffey, LPN     Sent: 1/6/10962/12/2014  1:03 PM      To: Wendall StadePeter C Nishan, MD        NEED RESULTS ./CY    LM T O CALL BACK./CY

## 2014-12-22 NOTE — Telephone Encounter (Signed)
Follow up      Want echo and lab results

## 2014-12-22 NOTE — Telephone Encounter (Signed)
Daughter called for echo and lab results. Made daughter aware of results for echo and labs. Daughter, [name], verbalized understanding.

## 2014-12-23 ENCOUNTER — Other Ambulatory Visit: Payer: Self-pay | Admitting: Pulmonary Disease

## 2014-12-23 ENCOUNTER — Encounter (HOSPITAL_COMMUNITY): Payer: Self-pay | Admitting: *Deleted

## 2014-12-23 ENCOUNTER — Emergency Department (HOSPITAL_COMMUNITY)
Admission: EM | Admit: 2014-12-23 | Discharge: 2014-12-23 | Disposition: A | Discharge status: Home / Self Care | Payer: Medicare Other | Attending: Emergency Medicine | Admitting: Emergency Medicine

## 2014-12-23 DIAGNOSIS — R251 Tremor, unspecified: Secondary | ICD-10-CM | POA: Diagnosis not present

## 2014-12-23 DIAGNOSIS — Z91040 Latex allergy status: Secondary | ICD-10-CM | POA: Insufficient documentation

## 2014-12-23 DIAGNOSIS — Z87448 Personal history of other diseases of urinary system: Secondary | ICD-10-CM | POA: Insufficient documentation

## 2014-12-23 DIAGNOSIS — I10 Essential (primary) hypertension: Secondary | ICD-10-CM | POA: Diagnosis not present

## 2014-12-23 DIAGNOSIS — D649 Anemia, unspecified: Secondary | ICD-10-CM | POA: Insufficient documentation

## 2014-12-23 DIAGNOSIS — Z7982 Long term (current) use of aspirin: Secondary | ICD-10-CM | POA: Insufficient documentation

## 2014-12-23 DIAGNOSIS — Z8639 Personal history of other endocrine, nutritional and metabolic disease: Secondary | ICD-10-CM | POA: Diagnosis not present

## 2014-12-23 DIAGNOSIS — M1990 Unspecified osteoarthritis, unspecified site: Secondary | ICD-10-CM | POA: Insufficient documentation

## 2014-12-23 DIAGNOSIS — R0602 Shortness of breath: Secondary | ICD-10-CM | POA: Diagnosis present

## 2014-12-23 DIAGNOSIS — Z87440 Personal history of urinary (tract) infections: Secondary | ICD-10-CM | POA: Diagnosis not present

## 2014-12-23 DIAGNOSIS — Z79899 Other long term (current) drug therapy: Secondary | ICD-10-CM | POA: Diagnosis not present

## 2014-12-23 DIAGNOSIS — Z8719 Personal history of other diseases of the digestive system: Secondary | ICD-10-CM | POA: Diagnosis not present

## 2014-12-23 DIAGNOSIS — IMO0001 Reserved for inherently not codable concepts without codable children: Secondary | ICD-10-CM

## 2014-12-23 LAB — CBC WITH DIFFERENTIAL/PLATELET
BASOS PCT: 0 % (ref 0–1)
Basophils Absolute: 0 10*3/uL (ref 0.0–0.1)
EOS ABS: 0.1 10*3/uL (ref 0.0–0.7)
Eosinophils Relative: 1 % (ref 0–5)
HEMATOCRIT: 40.8 % (ref 36.0–46.0)
HEMOGLOBIN: 13.6 g/dL (ref 12.0–15.0)
LYMPHS ABS: 1.1 10*3/uL (ref 0.7–4.0)
Lymphocytes Relative: 13 % (ref 12–46)
MCH: 30.3 pg (ref 26.0–34.0)
MCHC: 33.3 g/dL (ref 30.0–36.0)
MCV: 90.9 fL (ref 78.0–100.0)
MONO ABS: 0.7 10*3/uL (ref 0.1–1.0)
Monocytes Relative: 9 % (ref 3–12)
NEUTROS PCT: 77 % (ref 43–77)
Neutro Abs: 6.4 10*3/uL (ref 1.7–7.7)
PLATELETS: 172 10*3/uL (ref 150–400)
RBC: 4.49 MIL/uL (ref 3.87–5.11)
RDW: 13.7 % (ref 11.5–15.5)
WBC: 8.3 10*3/uL (ref 4.0–10.5)

## 2014-12-23 LAB — COMPREHENSIVE METABOLIC PANEL
ALT: 15 U/L (ref 0–35)
ANION GAP: 6 (ref 5–15)
AST: 21 U/L (ref 0–37)
Albumin: 3.2 g/dL — ABNORMAL LOW (ref 3.5–5.2)
Alkaline Phosphatase: 60 U/L (ref 39–117)
BUN: 21 mg/dL (ref 6–23)
CHLORIDE: 103 mmol/L (ref 96–112)
CO2: 30 mmol/L (ref 19–32)
Calcium: 9 mg/dL (ref 8.4–10.5)
Creatinine, Ser: 1.06 mg/dL (ref 0.50–1.10)
GFR calc Af Amer: 52 mL/min — ABNORMAL LOW (ref >90)
GFR calc non Af Amer: 45 mL/min — ABNORMAL LOW (ref >90)
Glucose, Bld: 119 mg/dL — ABNORMAL HIGH (ref 70–99)
Potassium: 4.3 mmol/L (ref 3.5–5.1)
Sodium: 139 mmol/L (ref 135–145)
Total Bilirubin: 0.3 mg/dL (ref 0.3–1.2)
Total Protein: 5.7 g/dL — ABNORMAL LOW (ref 6.0–8.3)

## 2014-12-23 LAB — CK: CK TOTAL: 39 U/L (ref 7–177)

## 2014-12-23 LAB — I-STAT CG4 LACTIC ACID, ED: LACTIC ACID, VENOUS: 1.2 mmol/L (ref 0.5–2.0)

## 2014-12-23 NOTE — ED Provider Notes (Signed)
CSN: [id number]     Arrival date & time 12/23/14  2028 History   First MD Initiated Contact with Patient 12/23/14 2049     Chief Complaint  Patient presents with  . Shortness of Breath     (Consider location/radiation/quality/duration/timing/severity/associated sxs/prior Treatment) Patient is a 81 y.o. female presenting with shortness of breath. The history is provided by the patient and a relative.  Shortness of Breath She actually was not short of breath but she was for a shaky and tremulous. This lasted for about 20 minutes and then resolved. Both hands were shaking. She was not hurting anywhere and denies any difficulty breathing and denies any nausea or vomiting. She called for an ambulance and symptoms resolved but then shaking recurred while in the implants. She states that she feels fine now. She is currently being treated for bullous pemphigoid and is on once a week methotrexate and family is concerned that she might be having side effects of the methotrexate. Apparently, her rash has improved dramatically but and dermatologist states that she still needs to be on the medication.  Past Medical History  Diagnosis Date  . Shortness of breath   . Unspecified essential hypertension   . Right bundle branch block   . Other and unspecified hyperlipidemia   . Other abnormal glucose   . Diverticulosis of colon (without mention of hemorrhage)   . Pyelonephritis, unspecified   . Solitary cyst of breast   . Osteoarthrosis, unspecified whether generalized or localized, unspecified site   . Lumbago   . Spinal stenosis, unspecified region other than cervical   . Anemia, unspecified   . GERD (gastroesophageal reflux disease)   . Pyelonephritis   . DJD (degenerative joint disease)   . UTI (lower urinary tract infection)    Past Surgical History  Procedure Laterality Date  . Cataract extraction    . Right hip hemiarthroplasty      total  . Conversion to right thr     Family History   Problem Relation Age of Onset  . Cancer Brother   . Cancer Brother    History  Substance Use Topics  . Smoking status: Never Smoker   . Smokeless tobacco: Never Used  . Alcohol Use: No   OB History    No data available     Review of Systems  Respiratory: Positive for shortness of breath.   All other systems reviewed and are negative.     Allergies  Azithromycin; Ciprofloxacin; Levofloxacin; Latex; and Other  Home Medications   Prior to Admission medications   Medication Sig Start Date End Date Taking? Authorizing Provider  ADVAIR DISKUS 100-50 MCG/DOSE AEPB Inhale 1 puff into the lungs as needed (shortness of breath).  08/07/14   Historical Provider, MD  albuterol (PROVENTIL HFA;VENTOLIN HFA) 108 (90 BASE) MCG/ACT inhaler Inhale 2 puffs into the lungs every 6 (six) hours as needed for wheezing or shortness of breath.    Historical Provider, MD  Alum & Mag Hydroxide-Simeth (MAGIC MOUTHWASH W/LIDOCAINE) SOLN Take 5 mLs by mouth 4 (four) times daily as needed for mouth pain. 11/15/14   Michele Mcalpine, MD  amLODipine (NORVASC) 5 MG tablet Take 1 tablet (5 mg total) by mouth daily. 06/15/14   Michele Mcalpine, MD  aspirin 81 MG tablet Take 81 mg by mouth 2 (two) times daily.     Historical Provider, MD  Ca Carbonate-Mag Hydroxide (ROLAIDS PO) Take 1-2 tablets by mouth as needed (reflux).     Historical Provider,  MD  ferrous sulfate 325 (65 FE) MG tablet Take 325 mg by mouth daily with breakfast.    Historical Provider, MD  hydrochlorothiazide (HYDRODIURIL) 25 MG tablet Take 12.5 mg by mouth daily.    Historical Provider, MD  Menthol, Topical Analgesic, (BIOFREEZE) 4 % GEL Apply 1 application topically daily as needed (pain).     Historical Provider, MD  Misc Natural Products (OSTEO BI-FLEX ADV DOUBLE ST PO) Take 1 tablet by mouth daily.     Historical Provider, MD  Multiple Vitamin (MULTIVITAMIN WITH MINERALS) TABS tablet Take 1 tablet by mouth daily. Centrum Silver    Historical  Provider, MD  polyethylene glycol (MIRALAX / GLYCOLAX) packet Take 17 g by mouth daily as needed for mild constipation.     Historical Provider, MD  potassium chloride (K-DUR,KLOR-CON) 10 MEQ tablet Take 1 tablet (10 mEq total) by mouth daily. 11/16/14   Michele McalpineScott M Nadel, MD  predniSONE (DELTASONE) 20 MG tablet Take 20 mg by mouth daily with breakfast.    Historical Provider, MD   BP 136/59 mmHg  Pulse 84  Temp(Src) 97.6 F (36.4 C) (Oral)  Resp 17  SpO2 96% Physical Exam  Nursing note and vitals reviewed.  81 year old female, resting comfortably and in no acute distress. Vital signs are normal. Oxygen saturation is 96%, which is normal. Head is normocephalic and atraumatic. PERRLA, EOMI. Oropharynx is clear. Neck is nontender and supple without adenopathy or JVD. Back is nontender and there is no CVA tenderness. Lungs are clear without rales, wheezes, or rhonchi. Chest is nontender. Heart has regular rate and rhythm without murmur. Abdomen is soft, flat, nontender without masses or hepatosplenomegaly and peristalsis is normoactive. Extremities have no cyanosis or edema, full range of motion is present. Mild venous stasis changes are present. Skin is warm and dry. She has some areas in the lower back with hyperpigmented skin but no bullous lesions are seen.. Neurologic: Mental status is normal, cranial nerves are intact, there are no motor or sensory deficits.  ED Course  Procedures (including critical care time) Labs Review Results for orders placed or performed during the hospital encounter of 12/23/14  Comprehensive metabolic panel  Result Value Ref Range   Sodium 139 135 - 145 mmol/L   Potassium 4.3 3.5 - 5.1 mmol/L   Chloride 103 96 - 112 mmol/L   CO2 30 19 - 32 mmol/L   Glucose, Bld 119 (H) 70 - 99 mg/dL   BUN 21 6 - 23 mg/dL   Creatinine, Ser 4.781.06 0.50 - 1.10 mg/dL   Calcium 9.0 8.4 - 29.510.5 mg/dL   Total Protein 5.7 (L) 6.0 - 8.3 g/dL   Albumin 3.2 (L) 3.5 - 5.2 g/dL   AST  21 0 - 37 U/L   ALT 15 0 - 35 U/L   Alkaline Phosphatase 60 39 - 117 U/L   Total Bilirubin 0.3 0.3 - 1.2 mg/dL   GFR calc non Af Amer 45 (L) >90 mL/min   GFR calc Af Amer 52 (L) >90 mL/min   Anion gap 6 5 - 15  CBC with Differential  Result Value Ref Range   WBC 8.3 4.0 - 10.5 K/uL   RBC 4.49 3.87 - 5.11 MIL/uL   Hemoglobin 13.6 12.0 - 15.0 g/dL   HCT 62.140.8 30.836.0 - 65.746.0 %   MCV 90.9 78.0 - 100.0 fL   MCH 30.3 26.0 - 34.0 pg   MCHC 33.3 30.0 - 36.0 g/dL   RDW 84.613.7 96.211.5 - 95.215.5 %  Platelets 172 150 - 400 K/uL   Neutrophils Relative % 77 43 - 77 %   Neutro Abs 6.4 1.7 - 7.7 K/uL   Lymphocytes Relative 13 12 - 46 %   Lymphs Abs 1.1 0.7 - 4.0 K/uL   Monocytes Relative 9 3 - 12 %   Monocytes Absolute 0.7 0.1 - 1.0 K/uL   Eosinophils Relative 1 0 - 5 %   Eosinophils Absolute 0.1 0.0 - 0.7 K/uL   Basophils Relative 0 0 - 1 %   Basophils Absolute 0.0 0.0 - 0.1 K/uL  CK  Result Value Ref Range   Total CK 39 7 - 177 U/L  I-Stat CG4 Lactic Acid, ED  Result Value Ref Range   Lactic Acid, Venous 1.20 0.5 - 2.0 mmol/L    EKG Interpretation   Date/Time:  Thursday December 23 2014 20:44:39 EST Ventricular Rate:  91 PR Interval:  283 QRS Duration: 126 QT Interval:  370 QTC Calculation: 455 R Axis:   -18 Text Interpretation:  Sinus rhythm Prolonged PR interval Right bundle  branch block Borderline ST elevation, lateral leads When compared with ECG  of 08/04/2015, No significant change was found Confirmed by [facility]  MD,  Andriana Casa ([id number]) on 12/23/2014 8:50:42 PM      MDM   Final diagnoses:  Shaking spells    Episode of tremors of uncertain cause. She is back to baseline. Screening labs will be obtained. And his pain discharge if screening labs are normal.  Workup is unremarkable and she is discharged. There are concerns about whether this could be a reaction to methotrexate. They're advised to direct those concerns to her dermatologist.  Dione Booze, MD 12/23/14 [phone]

## 2014-12-23 NOTE — ED Notes (Signed)
Glick, MD at bedside.  

## 2014-12-23 NOTE — ED Notes (Signed)
Pt stated she was sitting on the couch and began feeling SOB, anxious, weak, hot and hand tremors around 1900. Brought by EMS, Pt states no further symptoms, no pain, no weakness no tremors.  Pt states she had a spell like this 7 months ago and they found "nothing wrong".

## 2014-12-23 NOTE — Discharge Instructions (Signed)
Talk with your dermatologist regarding whether he should continue taking the medication or should be switched to a different medication.

## 2014-12-29 ENCOUNTER — Ambulatory Visit: Payer: Medicare Other | Admitting: Pulmonary Disease

## 2015-01-06 ENCOUNTER — Other Ambulatory Visit: Payer: Self-pay | Admitting: Pulmonary Disease

## 2015-01-06 MED ORDER — MAGIC MOUTHWASH W/LIDOCAINE: 5.0000 mL | Freq: Four times a day (QID) | ORAL | Status: DC | PRN

## 2015-01-07 ENCOUNTER — Other Ambulatory Visit: Payer: Self-pay | Admitting: Pulmonary Disease

## 2015-01-14 ENCOUNTER — Other Ambulatory Visit: Payer: Self-pay | Admitting: Pulmonary Disease

## 2015-01-18 NOTE — Telephone Encounter (Signed)
Pt requesting a refill on magic mouthwash.  Last refilled 01/06/15 with 5 refills.  Called pharmacy to make sure pt had not gone through all of these refills.  Pharmacy states they never received this refill, the last rx was from December 2015.  Verbally relayed last rx with refills.  Nothing further needed.

## 2015-01-26 ENCOUNTER — Ambulatory Visit (INDEPENDENT_AMBULATORY_CARE_PROVIDER_SITE_OTHER): Payer: Medicare Other | Admitting: Pulmonary Disease

## 2015-01-26 ENCOUNTER — Encounter: Payer: Self-pay | Admitting: Pulmonary Disease

## 2015-01-26 VITALS — BP 142/80 | HR 78 | Temp 99.6°F | Ht 68.0 in | Wt 178.1 lb

## 2015-01-26 DIAGNOSIS — I10 Essential (primary) hypertension: Secondary | ICD-10-CM

## 2015-01-26 DIAGNOSIS — M4806 Spinal stenosis, lumbar region: Secondary | ICD-10-CM

## 2015-01-26 DIAGNOSIS — R269 Unspecified abnormalities of gait and mobility: Secondary | ICD-10-CM

## 2015-01-26 DIAGNOSIS — R609 Edema, unspecified: Secondary | ICD-10-CM

## 2015-01-26 DIAGNOSIS — K5900 Constipation, unspecified: Secondary | ICD-10-CM

## 2015-01-26 DIAGNOSIS — R21 Rash and other nonspecific skin eruption: Secondary | ICD-10-CM

## 2015-01-26 DIAGNOSIS — R0602 Shortness of breath: Secondary | ICD-10-CM

## 2015-01-26 DIAGNOSIS — I872 Venous insufficiency (chronic) (peripheral): Secondary | ICD-10-CM

## 2015-01-26 DIAGNOSIS — F419 Anxiety disorder, unspecified: Secondary | ICD-10-CM

## 2015-01-26 DIAGNOSIS — M159 Polyosteoarthritis, unspecified: Secondary | ICD-10-CM

## 2015-01-26 DIAGNOSIS — K2100 Gastro-esophageal reflux disease with esophagitis, without bleeding: Secondary | ICD-10-CM

## 2015-01-26 DIAGNOSIS — M48061 Spinal stenosis, lumbar region without neurogenic claudication: Secondary | ICD-10-CM

## 2015-01-26 DIAGNOSIS — I4510 Unspecified right bundle-branch block: Secondary | ICD-10-CM

## 2015-01-26 DIAGNOSIS — M150 Primary generalized (osteo)arthritis: Secondary | ICD-10-CM

## 2015-01-26 MED ORDER — FUROSEMIDE 20 MG PO TABS: 20.0000 mg | ORAL_TABLET | Freq: Every day | ORAL | Status: DC

## 2015-01-26 NOTE — Patient Instructions (Signed)
Today we updated your med list in our EPIC system...    Continue your current medications the same...  We decided to change her HCTZ to LASIX (Furosemide) 20mg  - take one tab each AM...    Remember to eliminate the salt/ sodium from your diet...    Keep legs elevated...    And consider wearing support hose...  We will arrange for a home care company to do a home assessment...  Call for any questions...  Let's plan a follow up visit in [phone], sooner if needed for problems.[name]..[name]

## 2015-01-26 NOTE — Progress Notes (Signed)
Subjective:    Patient ID: [name], female    DOB: 10/04/1923, 81 y.o.   MRN: [id number]  HPI 81 y/o WF here for a follow up visit... she has multiple medical problems as noted below...  Followed for general medical purposes w/ hx chr obstructive asthma, HBP, RBBB, Hypercholesterolemia, borderline DM, DJD, LBP w/ sp stenosis, etc... ~  SEE PREV EPIC NOTES FOR THE OLDER DATA >>   ~  August 17, 2013:  60moROV & [name] continues to do well at 820 no new complaints or concerns...    COPD stable on Advair100 & only using it prn she says; no recent resp exac & denies cough, sput, hemoptysis, SOB, CP, etc...    BP controlled on Amlod5 & Hct25-1/2 daily; BP= 130/70 & she denies CP, palpit, dizzy, SOB, edema, etc...     Chol & BS controlled on diet alone; weight is down 4# to 169# today...    GI is stable on Protonix40, antiireflux regimen, & Miralax...     DJD/ LBP treated w/ Vits, Osteobiflex, Celebrex200 prn & Neurontin100 prn- this is the way she likes it! We reviewed prob list, meds, xrays and labs> see below for updates >> ok 2014 Flu vaccine today...  ~  February 15, 2014:  683moOV & [name] recently saw TP w/ incr SOB/DOE walking & Qhs, swelling in legs, etc; CXR & Labs were ok; she changed her Hct to Lasix & improved... We reviewed the following medical problems during today's office visit >>     Chr Obstructive Asthma> stable on Advair100 (but only using it prn) & Proventil prn; notes breathing back to baseline & Prn Klonopin helps dyspnea...    HBP> on ASA81, Norvasc5, Lasix20- 1-2/d; BP=136/70, tol meds well; denies CP, palpit, ch in SOB, but notes persist edema in legs; Rec to ch Amlod5 to Losar50, low sodium, elev legs, Lasix40...    CHOL> on diet alone, refuses meds, FLP 3/14 shows TChol 177, TG 50, HDL 60, LDL 107    DM> on diet alone, wt up slightly to  ~179#, BS=95, last A1c (2/13) was 6.3 & she knows to restrict carbs etc...    GI- Reflux, Divertics, constip> on Protonix40,  Miralax, Senakot-S; continue same meds.. [name]  DJD/ LBP> on Celebrex & osteobiflex prn; had right THR 2011; known sp stenosis w/ prev ESI... We reviewed prob list, meds, xrays and labs> see below for updates >>   CXR 3/15 showed norm heart size, clear lungs, elev of right hemidiaph, NAD...  LABS 3/15:  Chems- wnl;  CBC- wnl;  BNP=26...  ~  Apr 14, 2014:  73m73moV & add-on appt requested for "indigestion"> difficult hx but pt appears to be c/o swallowing difficulty, dysphagia, heartburn, & indigestion according to the daugh; she's had gas, belching, hoarseness, SOB, and early satiety; also notes constipation & took mag citrate for relief;  Epic review indicates hx indigestion & reflux symptoms- treated by [name] w/ PPI, she had EGD & Colon 2002;  Notes food sticking in mid-esoph w/ pain, nausea despite her Prevacid30 qam...  We decided to increase the PPI- Prevacid30Bid and refer to [name] for EGD and dilatation....    She is also c/o incr ankle edema; on Lasix20, no salt, etc;  We discussed increase diuretic to 9m37m and re-double efforts at sodium restriction, elevation, support hose, etc...     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     She also had some ankle edema while on Lasix20 and was told no  salt, incr Lasix to 42md & elevate/ support hose/ etc... Once again daughter has changed her meds based on perceived side effects ("causing head spells- I feel my heart in my head"); they stopped the Lasix & she does not want to restart- asking to go back on prev meds= Amlod5 & HCT12.5 daily, OK...    Similarly she as Klonopin 0.2m51mo take 1/2 to 1 tab as needed for dyspnea or anxiety, and she tells me she hasn't even tried it one time!  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  I have again recommended that the pt try to increase her Klonopin from 0.2mg36ms to one Bid or perhaps try 1/2 in AM, 1/2 in afternoon, and 1 at bedtime...     Finally the daughter asked about ordering some Physical Therapy for her mother; they feel she is getting weaker esp in legs, slower, and balance is off; we reviewed the need for exercise and we will request PT consult & their involvement We reviewed prob list, meds, xrays and labs> see below for updates >> OK 2015 flu vaccine today  ~  September 28, 2014:  61mo 60mo& [name] says "I'm not doing well"; c/o skin rash- daughter thinks it's the EKG tabs (but she has rash on lowe back/ buttock), states "allergic to elastic", she has seen Derm- [name] on 3 diff creams  (currently on clobetasol0.05% & Eurcin calming lotion); they did Bx= "looked ok, it was neg";  He is apparently considering an immunosuppressant med 7 want her checked for Tb- we will send Quantiferon Gold...     She saw DrJEdwards for GI> nothing avail in EPIC yet but daughter states he did MBS& he rec warm liquids w/ her meals & she is 90% better decr burbs, decr gas, swallowing better; pt stopped Nexium on her own & refuses restart...    On Advair100, AlbutHFA, MMW> all being used just prn & she states breathing is 90% better...Marland KitchenMarland Kitchen  On Amlod5 & HCT12.5; BP= 140/70 w/o CP, palpit, SOB, edema...     She is getting outpt PT now- helping she says...    She is off the Klonopin & refuses to restart this med (daugh states she never really took the first pill)... We reviewed prob list, meds, xrays and labs> see below for updates >>  PLAN>> we wrote for Atarax 91m Q6h prn itching; we sent Quantiferon gold- NEG...   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, etc;  She has mult somatic complaints- good days & bad, hard to sort out the salient features, I have prev rec a low dose Klonopin Bid to help w/ this & her dyspnea but daugh states she has never taken it, we discussed this again;  CC is leg pain- difficulty walking, uses cane, known spinal stenosis, she's had PT, she's had shots and encouraged to f/u w/ drRamos about this; not on pain meds "I use natural products";  Also c/o ?dyspnea as before, phlegm in throat, "attacks" of swallowing trouble which we have investigated w/ Ba swallow/ UGI, speech path eval, etc & she is rec to take Nexium 40Bid; daughter states she occas has to drive her around in  her car w/ window 1/2 open to get relief (I again rec use of the Klonopin); offered GI eval w/ [name] or DrJEdwards & they will decide; daugh notes that they have tried everything for the phlegm in throat including Mucinex, Rock&Rye, Rawleigh's salve, Vicks vaporub, Slippery [place]m lozenges, rolaids, warm honey & lemon, MMW; they wonder about food allergy & they are encouraged to f/u w/ GI;  Finally they mention her rash- sm patch on her back over sacrum & she was tried on several creams per DrJones- intol to some of them- still on Prednisone 2010m & she needs to wean this off, they have f/u appt in several days...     There have been mult phone calls in the interim> feeling weak, SOB, edema; daugh states she not taking her Advair, Klonopin, on HCT25-1/2 daily, they didn't want to incr meds but requested Cards eval for "CHF"-ok...    She saw DrNCherly Hensen/30/15 but wouldn't allow EKG due to "allergy to the electrodes"; his note is reviewed- he agreed w/ ARB/diuretic but noted pts refusal & she wanted to be back on low dose Amlod; no signs of CHF- they did 2DEcho showing norm LV size & function w/ EF=65-70%, AoV leaflets mildly thickened w/o AS, MV leaflets mod thickened w/ trivMR, mild RA dil, PAsys=43m19m no change in meds...     She went to the ED 11/22/14 w/ a choking episode, SOB, altered mental status> note reviewed- all symptoms resolved spont; they did CXR- NAD; and Blood work- wnl...    Derm eval [name] w/ Dx of bullous pemphigoid- on Pred 20mg50m they understand that this can contrib to edema; advised 2gm Na diet etc; they do not want stronger diuretic...  We reviewed prob list, meds, xrays and labs> see below for updates >>   CXR 1/16 showed norm heart size, clear lungs, mild right diaph eventration- no change, Tspine DJD. DISH/ osteopenia; NAD...   2DEcho 1/16  showed norm LV size & function w/ EF=65-70%, AoV leaflets mildly thickened w/o AS, MV leaflets mod thickened w/ trivMR, mild RA dil,  PAsys=4mHg...  LABS 1/16:  Chems- wnl w/ Cr=0.95;  BNP=66;  CBC- wnl w/ Hg=14.4...  ~  January 26, 2015:  6wk ROV & MShawndrareturns w/ her daughter SPearlean [name] she appears stable and has no new complaints but retains mult minor somatic complaints as usual; the biggest issue is disposition & personal family matters; MLevy Sjogrencontinues to live in her house alone, daughter does a great job of caring for her but it is wearing her out & pt is not always cooperative... They have looked into AL vs skilled care at several facities but they have not been to her satisfaction;  We will try for a [facility] home assessment & i told them i would be happy to assist in any way possible...     She has gained 9# & has 1-2+ pitting edema in LEs; daughter states she will only eat [employer] food "I like it" & we reviewed low sodium options; we decided to chenage her HCT25-1/2 tab to LVirginiaper day...    She needs to increase her exercise program    We reviewed prob list, meds, xrays and labs> see below for updates >>  PLAN>> they will continue to look into AL situations; we will request [facility] home health assessment; change HCT to LASIX272mQam, no salt, elev legs, try support hose...          Problem List:       DYSPNEA (ICD-786.05) - long hx of chronic obstructive asthma treated w/ ADVAIR100Bid & PROAIR (she uses them Prn now)... she is a non-smoker w/ some reactive airways disease in the past & retired from [employer] years in 1991... she denies cough, sputum, hemoptysis, worsening dyspnea, wheezing, chest pains, snoring, daytime hypersomnolence, etc...  ~  baseline CXR w/o acute changes...  ~  PFT's 5/02 w/ FVC 2.07 (68%), FEV1=1.36 (57%), and FEV1/FVC ratio=66%, mid-flows 42%... ~  CT Angio 7/10 was neg- x biapical pleuroparenchymal scarring... ~  CXR 10/11 showed sl elev right hemidaiph, mild DJD sp, osteopenia, NAD...[name]~  CXR 6/12 showed mild apical scarring, clear & NAD, DJD sp w/ osteophytes... ~   Intermittent dyspnea more related to anxiety & treated w/ KLONOPIN 0.41m8m/2 to 1 tab Bid==> improved. ~  CXR 4/13 showed normal heart size, clear lungs, DJD in TSpine... ~  CXR 2/14 showed normal heart size, clear lungs w/ sl peribronch thickening, DJD in spine, NAD... [name]  CXR 3/15 showed norm heart size, clear lungs, elev of right hemidiaph, NAD... ~  She is encouraged to take the Klonopin 0.41mg87md regularly- consider taking 1/2 in AM, 1/2 in afternoon, one at bedtime... ~  1/16: she continues w/ mult somatic complaints and intermittent choking episodes etc; she refuses to take the Advair or Klonopin... ~  CXR 1/16 showed norm heart size, clear lungs, mild right diaph eventration- no change, Tspine DJD. DISH/ osteopenia; NAD... ~  3/16: she is stable on current meds, asked to incr her non-existent exercise program...  HYPERTENSION (ICD-401.9) - controlled on NORVASC 41mg 31mly & HCTZ 241mgt741maily...  ~  2/13:  BP 144/70 today> tol rx well & denies HA, visual changes, CP, palipit, dizziness, syncope, edema, etc... ~  4/13:  BP= 148/80 & she denies CP, palpit, edema; dyspnea improved w/ Klonopin. ~  6/13:  BP= 132/68 & as noted she has mult somatic complaints... ~  3/14:  on Norvasc10, HCTZ25; BP=136/60, tol meds well; denies CP, palpit, ch in SOB, edema, etc  ~  9/14:  BP controlled on Amlod5 & Hct25-1/2 daily; BP= 130/70 & she denies CP, palpit, dizzy, SOB, edema, etc. ~  3/15: on ASA81, Norvasc5, Lasix20- 1-2/d; BP=136/70, tol meds well; denies CP, palpit, ch in SOB, but notes persist edema in legs; Rec to ch Amlod5 to Losar50, low sodium, elev legs, Lasix40. ~  5/15: on Losar50, Lasix40;  BP= 138/60 & she denies angina pain, palpit, ch in SOB, etc... ~  7/15: on Losar50, she stopped Lasix; BP= 138/78 and they want to go back on Amlod5 + HCT12.5 daily... ~  9/15: on Amlod5, Hct12.5; BP= 128/80 & she likes this combo better- denies CP, palpit, etc... ~  1/16: on Amlod5, Hct12.5 but ?what she is  taking; BP= 130/80 & she has refused the ARBs... ~  3/16: on Amlod5, Hct12.5; BP= 142/80 & she has gained 9# w/ 1-2+ edema; discussed low sodium & change Hct to LASIX20 Qam...  RIGHT BUNDLE BRANCH BLOCK (ICD-426.4) - on ASA 77m/d... baseline EKG w/ RBBB and 2DEcho 5/02 showed mild asymmetric LVH w/ incr EF... ~  12/15: she had Cards eval by DrNishan> he rec ARB/diuretic but she refused to switch from her Amlod5/ Hct12.5 regimen; she refused EKG due to "allergy to the electrodes"... ~  2DEcho 1/16 showed norm LV size & function w/ EF=65-70%, AoV leaflets mildly thickened w/o AS, MV leaflets mod thickened w/ trivMR, mild RA dil, PAsys=358mg...  VENOUS INSUFFIC & EDEMA >>  ~  3/15: she was switched off Amlod & onto Losar50, Lasix20=>40, plus no salt, elevation, support hose, etc...  ~  [facility]; she has VI & 1+edema but stable, no acute changes... 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 ~  3/16: she remains on the Pred from Derm, now w/ incr edema & 9# wt gain, rec no salt & change Hct to LASIX20/d...  HYPERLIPIDEMIA (ICD-272.4) - on diet alone... she forgets to come to visits FASTING for this blood work ~  [place] showed TChol 205, TG 71, HDL 49, LDL 130... ~  FLP 6/12 on diet alone showed TChol 218, TG 50, HDL 66, LDL 129 ~  FLP 2/13 on diet alone showed TChol 171, TG 43, HDL 66, LDL 97 ~  FLP 3/14 on diet alone showed TChol 177, TG 50, HDL 60, LDL 107 ~  She needs to ret FASTING for f/u FLP...  DIABETES MELLITUS, BORDERLINE (ICD-790.29) - on diet alone w/ prev BS's in the 100-160 range... ~  labs in 2008-9 showed BS= 101 to 108 ~  labs 1/10 showed BS= 106, A1c= 5.9 ~  Labs 6/12 showed BS= 93, A1c= 6.6...Marland KitchenMarland Kitchenec diet, exercise... ~  Labs 2/13 showed BS= 92, A1c= 6.3 ~  3/14: on diet alone, wt stable ~174#, BS=97, last A1c (2/13) was 6.3 & she knows to restrict carbs etc. ~  Labs 3/15 showed BS= 95;  and BS= 120 in [id number].   INDIGESTION/ REFLUX SYMPTOMS >> see 10/12 note & PROTONIX 4021m started, further eval if symptoms persist... ESOPHAGEAL DYSMOTILITY/ PRESBYESOPHAGUS >>  ~  4/13:  She notes some reflux symptoms and excess gas w/ belching; rec to take the Protonix daily & Simethacone vs Tums which she says helps her gas. ~  5/13:  She saw GI [name] w/ rec to take Prilosec for her  indigestion... ~  5/15:  She presented w/ worsening indigestion, dysphagia, reflux & Prev30 was incr to Bid w/ GI f/u suggested for EGD... 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 ~  9/15:  Symptoms persist but she is not regurg or vomiting, and weight stable; they want to change to Nexium40Bid-OK, and rec proceed w/ MBS by speech path... ~  She continues to have intermit choking episodes and c/o phlegm in her throat; she has tried "everything" & encouraged to f/u w/ GI- [name]/ DrJEdwards...  DIVERTICULOSIS OF COLON (ICD-562.10) - she takes SENAKOT-S, MIRALAX, Peppermint Tea, & sauerkraut Prn...last colonoscopy 9/02 by [name] was WNL...  PYELONEPHRITIS (ICD-590.80) - SEE 1/09 Hospitalization (reviewed)... ~  She saw DrMacDiarmid for her recurrent UTIs, chronic cystitis, urge & stress incont, nocturia; she is INTOL to [place]...  Hx of BREAST CYST (ICD-610.0)  DEGENERATIVE JOINT DISEASE (ICD-715.90) - s/p right hip hemiarthroplasty 11/09 by DrAplington w/ wound complic... then dx w/ loosening of the femoral shaft & had conversion to right THR by DrAlusio 10/11 & much improved... she uses CELEBREX 237m Prn (seldom takes this).  LOW BACK PAIN SYNDROME (ICD-724.2) & SPINAL STENOSIS (ICD-724.00) -  severe LBP & spinal stenosis w/ evals by DrRamos & DrNudelman... s/p shots, considering poss surgery vs alternative therapies... she takes Celebrex, Osteobiflex, MVI, Vit D... ~  8/10: eval by DrAplington- diff leg lengths, lift placed in right shoe, then trial Lyrica576m.. ~  12/11:  improved after hip revision surg (to THR) 10/11 w/ better ambulaton... ~  5/13:  DrRamos gave her another ESI for her leg pain related to sp stenosis... ~  3/14:  C/o neuropathic discomfort in legs- eval by Ramos & offered shots in her back; try Lyrica50 in the interim... ~  Persistent leg pain, on OTC "natural" meds she says; encouraged to f/u w/ DrRamos et al...  Hx of ANEMIA (ICD-285.9) - eval by GI in 2002 showed normal EGD and Colon... prob iron malabsorption problem Rx'd w/ Fe infusion... ~  labs 1/10 showed Hg= 14.6, MCV= 89, Fe= 94 ~  labs 12/11 showed Hg= 12.8, MCV= 90, Fe= 33... try Fe supplement + VitC... ~  Labs 6/12 showed Hg= 15.0 ~  Labs 2/13 showed Hg= 14.6 ~  Labs 2/14 showed Hg= 14.8 ~  Labs 3/15 showed Hg= 14.9 ~  Labs 1/16 showed Hg= 14.4  DERM:  rash Rx'd by dermatology- OLUX-E foam= clobetasol Foam 0.05%... 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   Past Surgical History  Procedure Laterality Date  . Cataract extraction    . Right hip hemiarthroplasty      total  . Conversion to right thr      Outpatient Encounter Prescriptions as of 01/26/2015  Medication Sig  . ADVAIR DISKUS 100-50 MCG/DOSE AEPB Inhale 1 puff into the lungs as needed (shortness of breath).   . Marland Kitchenlbuterol (PROVENTIL HFA;VENTOLIN HFA) 108 (90 BASE) MCG/ACT inhaler Inhale 2 puffs into the lungs every 6 (six) hours as needed for wheezing or shortness of breath.  . Alum & Mag Hydroxide-Simeth (MAGIC MOUTHWASH W/LIDOCAINE) SOLN Take 5 mLs by mouth  4 (four) times daily as needed for mouth pain.  [name] aspirin 81 MG tablet Take 81 mg by mouth 2 (two) times daily.    . Ca Carbonate-Mag Hydroxide (ROLAIDS PO) Take 1-2 tablets by mouth as needed (reflux).   . ferrous sulfate 325 (65 FE) MG tablet Take 325 mg by mouth daily with breakfast.  . hydrochlorothiazide (HYDRODIURIL) 25 MG tablet TAKE 1/2 TABLET DAILY.  [name] Hydrocortisone Micronized POWD GARGLE AND SWALLOW ONE TEASPOONFUL 4 TIMES A DAY AS NEEDED.  [name] Menthol, Topical Analgesic, (BIOFREEZE) 4 % GEL Apply 1 application topically daily as needed (pain).   . Misc Natural Products (OSTEO BI-FLEX ADV DOUBLE ST PO) Take 1 tablet by mouth daily.   . Multiple Vitamin (MULTIVITAMIN WITH MINERALS) TABS tablet Take 1 tablet by mouth daily. Centrum Silver  . NORVASC 5 MG tablet TAKE 1 TABLET ONCE DAILY.  [name] polyethylene glycol (MIRALAX / GLYCOLAX) packet Take 17 g by mouth daily as needed for mild constipation.   . potassium chloride (K-DUR,KLOR-CON) 10 MEQ tablet Take 1 tablet (10 mEq total) by mouth daily.  . predniSONE (DELTASONE) 20 MG tablet Take 20 mg by mouth daily with breakfast.    Allergies  Allergen Reactions  . Azithromycin Shortness Of Breath    Trouble breathing  . Ciprofloxacin Other (See Comments)    REACTION: hallucinations  . Levofloxacin Other (See Comments)    Insomnia, indigestion, tingling sensation in legs  . Latex Rash  . Other Rash    EKG leads caused a rash that required steroids to clear    Current Medications, Allergies, Past Medical History, Past Surgical History, Family History, and Social History were reviewed in [employer] record.    Review of Systems         See HPI - all other systems neg except as noted... The patient complains of decreased hearing, dyspnea on exertion, muscle weakness, and difficulty walking.  The patient denies anorexia, fever, weight loss, weight gain, vision loss, hoarseness, chest pain, syncope, peripheral edema, prolonged cough, headaches, hemoptysis, abdominal pain, melena, hematochezia, severe indigestion/heartburn,  hematuria, incontinence, suspicious skin lesions, transient blindness, depression, unusual weight change, abnormal bleeding, enlarged lymph nodes, and angioedema.     Objective:   Physical Exam     WD, WN, Chr ill appearing 81 y/o WF in NAD... GENERAL:  Alert & oriented; pleasant & cooperative... HEENT:  [place]/AT, EOM-full, EACs-clear, TMs-wnl, NOSE-clear, THROAT-clear & wnl. NECK:  Supple w/ fairROM; no JVD; normal carotid impulses w/o bruits; no thyromegaly or nodules palpated; no lymphadenopathy. CHEST:  Clear to P & A; without wheezes/ rales/ or rhonchi heard... HEART:  Regular Rhythm; without murmurs/ rubs/ or gallops detected... ABDOMEN:  Soft & nontender; normal bowel sounds; no organomegaly or masses palpated... EXT:  mod arthritic changes, walks w/ cane, +venous insuffic & incr 1-2+ edema., scattered varicose veins... NEURO:  CN's intact; motor testing normal; no focal deficits... DERM:   mild intertrig rash under breast, & onychomycosis of toenails...  RADIOLOGY DATA:  Reviewed in the EPIC EMR & discussed w/ the patient...  LABORATORY DATA:  Reviewed in the EPIC EMR & discussed w/ the patient...   Assessment & Plan:    Episodes of choking & SOB>> see above, she has been resistent to trying any of the meds & treatment suggestions that we have given to her (I know she would improve on Benzo rx...   DYSPNEA>  Hx asthma, stable off Advair, on Proair prn; hx anxiety component on Klonopin but  she is not using!  HBP>  Controlled on Amlod5 + HCT12.5; but edema has increased w/ K&[employer] food & Pred per Derm; rec to change HCT to Lasix20/d...  RBBB>  Aware & denies CP, palpit, ch in [place], etc... She refuses f/u EKG since she thinks that she is allergic to electrodes...  CHOL>  On diet alone & FLP looks reasonable;  We reviewed low chol, low fat diet...  DM>  BS= 95 & A1c is 6.3 when last checked;  on diet alone & we reviewed low carb no sweets etc...  GI> Indigestion, Divertics> she notes  most bowel symptoms resolved off spicey foods;  UGI symptoms w/ dysphagia, food sticking, belch/gas/ etc have not resolved on Protonix40Bid and after GI consult w/ [name]; we reviewed her UGI series results and decided to proceed w/ MBS by Speech Path; transiently improved then sought 2nd opinion from DrJEdwards=> on Nexium40Bid...  UTI>  Klebsiella UTI resolved after Septra Rx... She has been eval by DrMacDiarmid.  DJD, LBP, Spinal Stenosis>  Prev evals by Ortho, DrRamos, DrNudelman etc; improved after THR w/ better ambulation; c/o neuropathic discomfort in legs- she will f/u w/ Ramos for shots, using OTC "natural" meds...  Anxiety>  If she would take the Klonopin, I feel it would help...   Patient's Medications  New Prescriptions   FUROSEMIDE (LASIX) 20 MG TABLET    Take 1 tablet (20 mg total) by mouth daily.  Previous Medications   ADVAIR DISKUS 100-50 MCG/DOSE AEPB    Inhale 1 puff into the lungs as needed (shortness of breath).    ALBUTEROL (PROVENTIL HFA;VENTOLIN HFA) 108 (90 BASE) MCG/ACT INHALER    Inhale 2 puffs into the lungs every 6 (six) hours as needed for wheezing or shortness of breath.   ALUM & MAG HYDROXIDE-SIMETH (MAGIC MOUTHWASH W/LIDOCAINE) SOLN    Take 5 mLs by mouth 4 (four) times daily as needed for mouth pain.   ASPIRIN 81 MG TABLET    Take 81 mg by mouth daily.    CA CARBONATE-MAG HYDROXIDE (ROLAIDS PO)    Take 1-2 tablets by mouth as needed (reflux).    FERROUS SULFATE 325 (65 FE) MG TABLET    Take 325 mg by mouth daily with breakfast.   MENTHOL, TOPICAL ANALGESIC, (BIOFREEZE) 4 % GEL    Apply 1 application topically daily as needed (pain).    MISC NATURAL PRODUCTS (OSTEO BI-FLEX ADV DOUBLE ST PO)    Take 1 tablet by mouth daily.    MULTIPLE VITAMIN (MULTIVITAMIN WITH MINERALS) TABS TABLET    Take 1 tablet by mouth daily. Centrum Silver   NORVASC 5 MG TABLET    TAKE 1 TABLET ONCE DAILY.   POLYETHYLENE GLYCOL (MIRALAX / GLYCOLAX) PACKET    Take 17 g by mouth daily as  needed for mild constipation.    POTASSIUM CHLORIDE (K-DUR,KLOR-CON) 10 MEQ TABLET    Take 1 tablet (10 mEq total) by mouth daily.   PREDNISONE (DELTASONE) 20 MG TABLET    Taking 15 mg daily per dermatology  Modified Medications   No medications on file  Discontinued Medications   HYDROCHLOROTHIAZIDE (HYDRODIURIL) 25 MG TABLET    TAKE 1/2 TABLET DAILY.   HYDROCORTISONE MICRONIZED POWD    GARGLE AND SWALLOW ONE TEASPOONFUL 4 TIMES A DAY AS NEEDED.

## 2015-01-27 ENCOUNTER — Telehealth: Payer: Self-pay | Admitting: Internal Medicine

## 2015-01-27 DIAGNOSIS — R0602 Shortness of breath: Secondary | ICD-10-CM

## 2015-01-27 DIAGNOSIS — R269 Unspecified abnormalities of gait and mobility: Secondary | ICD-10-CM

## 2015-01-27 NOTE — Telephone Encounter (Signed)
Spoke with Gwendolyn ChessmanMandy with [facility]  She wants to be sure that SN is aware that they do not do placement assessment, that it done by a Child psychotherapistsocial worker  She states that they only do observation and disease and med education  Please advise thanks

## 2015-01-28 ENCOUNTER — Telehealth: Payer: Self-pay | Admitting: Pulmonary Disease

## 2015-01-28 NOTE — Telephone Encounter (Signed)
New order placed in computer for pt to be set up for DME--RN visit to eval.  Nothing further is needed.

## 2015-01-28 NOTE — Telephone Encounter (Signed)
lmtcb X1 for [name] (pt's daughter)

## 2015-01-28 NOTE — Telephone Encounter (Signed)
Spoke with patient's daughter, she was wanting to know what [facility] is coming out for.  Explained to her that [facility] is coming out to do an Assessment at patient's home, explained to her how they will go over all of that information with her at her visit.  Nothing further needed.

## 2015-02-01 ENCOUNTER — Telehealth: Payer: Self-pay | Admitting: Pulmonary Disease

## 2015-02-01 NOTE — Telephone Encounter (Signed)
[name] is aware that [facility] is who she needs to contact. Nothing further was needed.

## 2015-02-01 NOTE — Telephone Encounter (Signed)
Spoke with [employer]. Advised her that they can disregard the order that was sent to them. Another order was placed for RN eval for [facility]. Nothing further was needed.

## 2015-02-02 ENCOUNTER — Telehealth: Payer: Self-pay | Admitting: Pulmonary Disease

## 2015-02-02 NOTE — Telephone Encounter (Signed)
Spoke with Jackquline BoschMatthew Wright [facility] States that pt daughter has refused any care from home health. States that he called and spoke with patient this morning and the patient seemed very "frazzled" during their conversation and refused to give consent for home health to come visit. Molli HazardMatthew states that he then received a call from the patient daughter, who was very adamant that no one is to step foot in patient's home, they do not want any home health care as the patient's "nerves" will not be able to handle people being in her home. Pt daughter then asked that their name be removed from [facility]'s system and to never call back again. Molli HazardMatthew wanted to call give a heads up that there will be no home health provided, states that he apologizes for any stress he might have caused the patient during their phone conversation. Wanted to give Dr Kriste BasqueNadel an Lorain ChildesFYI of the situation.  Nothing further needed.

## 2015-02-03 ENCOUNTER — Telehealth: Payer: Self-pay | Admitting: Pulmonary Disease

## 2015-02-03 NOTE — Telephone Encounter (Signed)
Spoke with patient's daughter in regards to the referral for home health. She stated that the home health company contacted her mother about the referral before the daughter had a chance to talk to her about people coming into her home to take care of her. She stated that her mother was very upset by the news and refused to have any home health company come into her home to take care of her. Daughter also stated that she did not like the company that called them because the person was too persistent on the phone and rude. Daughter stated that they are looking to place patient into an assisted living facility and appreciated the referral but were not going through with it at this time.

## 2015-02-03 NOTE — Telephone Encounter (Signed)
Called and spoke with patient's daughter about medication concern. Informed the daughter that SN wants the patient to continue on the furesomide and not go back to HCTZ. He does want to increase the lasix to 2 tablets once a day to help with the edema. Daughter stated that it would be difficult to get her mother to take 2, but she would try. Daughter voiced understanding of the instructions. No further questions were asked at this time.

## 2015-02-03 NOTE — Telephone Encounter (Signed)
Pt was changed from HCTZ to Lasix and since her swelling has worsened x 1 week. [name] notes that the patient's feet are more swollen than normal and patient having increased fatigue and decreased stamina. Denies any increased SOB. Patient has been following rec's per last OV. Wanting to switch back to HCTZ or another medication that is equivalent.   Patient Instructions     Today we updated your med list in our EPIC system...  Continue your current medications the same...  We decided to change her HCTZ to LASIX (Furosemide) 20mg  - take one tab each AM...  Remember to eliminate the salt/ sodium from your diet...  Keep legs elevated...  And consider wearing support hose...  We will arrange for a home care company to do a home assessment...  Call for any questions...  Let's plan a follow up visit in [phone], sooner if needed for problems...   Please advise Dr Kriste BasqueNadel. Thanks.

## 2015-02-04 NOTE — Telephone Encounter (Signed)
Nothing further needed per Fleet Contrasachel. Will sign off.

## 2015-02-07 ENCOUNTER — Ambulatory Visit: Payer: Medicare Other | Admitting: Pulmonary Disease

## 2015-03-15 ENCOUNTER — Telehealth: Payer: Self-pay | Admitting: Pulmonary Disease

## 2015-03-15 ENCOUNTER — Encounter (HOSPITAL_COMMUNITY): Payer: Self-pay | Admitting: *Deleted

## 2015-03-15 ENCOUNTER — Emergency Department (HOSPITAL_COMMUNITY): Payer: Medicare Other

## 2015-03-15 ENCOUNTER — Emergency Department (HOSPITAL_COMMUNITY)
Admission: EM | Admit: 2015-03-15 | Discharge: 2015-03-15 | Disposition: A | Discharge status: Home / Self Care | Payer: Medicare Other | Attending: Emergency Medicine | Admitting: Emergency Medicine

## 2015-03-15 DIAGNOSIS — Z7982 Long term (current) use of aspirin: Secondary | ICD-10-CM | POA: Diagnosis not present

## 2015-03-15 DIAGNOSIS — Z79899 Other long term (current) drug therapy: Secondary | ICD-10-CM | POA: Diagnosis not present

## 2015-03-15 DIAGNOSIS — F419 Anxiety disorder, unspecified: Secondary | ICD-10-CM | POA: Insufficient documentation

## 2015-03-15 DIAGNOSIS — Z8639 Personal history of other endocrine, nutritional and metabolic disease: Secondary | ICD-10-CM | POA: Diagnosis not present

## 2015-03-15 DIAGNOSIS — I10 Essential (primary) hypertension: Secondary | ICD-10-CM | POA: Insufficient documentation

## 2015-03-15 DIAGNOSIS — R0602 Shortness of breath: Secondary | ICD-10-CM | POA: Diagnosis present

## 2015-03-15 DIAGNOSIS — Z87440 Personal history of urinary (tract) infections: Secondary | ICD-10-CM | POA: Insufficient documentation

## 2015-03-15 DIAGNOSIS — Z8742 Personal history of other diseases of the female genital tract: Secondary | ICD-10-CM | POA: Diagnosis not present

## 2015-03-15 DIAGNOSIS — Z91040 Latex allergy status: Secondary | ICD-10-CM | POA: Diagnosis not present

## 2015-03-15 DIAGNOSIS — K219 Gastro-esophageal reflux disease without esophagitis: Secondary | ICD-10-CM | POA: Insufficient documentation

## 2015-03-15 DIAGNOSIS — J441 Chronic obstructive pulmonary disease with (acute) exacerbation: Secondary | ICD-10-CM | POA: Diagnosis not present

## 2015-03-15 DIAGNOSIS — Z7952 Long term (current) use of systemic steroids: Secondary | ICD-10-CM | POA: Insufficient documentation

## 2015-03-15 DIAGNOSIS — D649 Anemia, unspecified: Secondary | ICD-10-CM | POA: Diagnosis not present

## 2015-03-15 DIAGNOSIS — M1990 Unspecified osteoarthritis, unspecified site: Secondary | ICD-10-CM | POA: Insufficient documentation

## 2015-03-15 LAB — COMPREHENSIVE METABOLIC PANEL
ALT: 18 U/L (ref 0–35)
ANION GAP: 4 — AB (ref 5–15)
AST: 23 U/L (ref 0–37)
Albumin: 3.5 g/dL (ref 3.5–5.2)
Alkaline Phosphatase: 55 U/L (ref 39–117)
BILIRUBIN TOTAL: 0.7 mg/dL (ref 0.3–1.2)
BUN: 16 mg/dL (ref 6–23)
CALCIUM: 8.5 mg/dL (ref 8.4–10.5)
CO2: 31 mmol/L (ref 19–32)
Chloride: 105 mmol/L (ref 96–112)
Creatinine, Ser: 0.95 mg/dL (ref 0.50–1.10)
GFR calc Af Amer: 59 mL/min — ABNORMAL LOW (ref >90)
GFR calc non Af Amer: 51 mL/min — ABNORMAL LOW (ref >90)
GLUCOSE: 101 mg/dL — AB (ref 70–99)
Potassium: 3 mmol/L — ABNORMAL LOW (ref 3.5–5.1)
Sodium: 140 mmol/L (ref 135–145)
TOTAL PROTEIN: 6.2 g/dL (ref 6.0–8.3)

## 2015-03-15 LAB — URINALYSIS, ROUTINE W REFLEX MICROSCOPIC
Bilirubin Urine: NEGATIVE
Glucose, UA: NEGATIVE mg/dL
HGB URINE DIPSTICK: NEGATIVE
Ketones, ur: NEGATIVE mg/dL
Leukocytes, UA: NEGATIVE
Nitrite: NEGATIVE
Protein, ur: NEGATIVE mg/dL
SPECIFIC GRAVITY, URINE: 1.011 (ref 1.005–1.030)
Urobilinogen, UA: 0.2 mg/dL (ref 0.0–1.0)
pH: 7 (ref 5.0–8.0)

## 2015-03-15 LAB — CBC WITH DIFFERENTIAL/PLATELET
Basophils Absolute: 0 10*3/uL (ref 0.0–0.1)
Basophils Relative: 0 % (ref 0–1)
EOS ABS: 0.1 10*3/uL (ref 0.0–0.7)
EOS PCT: 1 % (ref 0–5)
HEMATOCRIT: 42.4 % (ref 36.0–46.0)
Hemoglobin: 13.7 g/dL (ref 12.0–15.0)
LYMPHS PCT: 26 % (ref 12–46)
Lymphs Abs: 1.5 10*3/uL (ref 0.7–4.0)
MCH: 29.5 pg (ref 26.0–34.0)
MCHC: 32.3 g/dL (ref 30.0–36.0)
MCV: 91.2 fL (ref 78.0–100.0)
MONOS PCT: 11 % (ref 3–12)
Monocytes Absolute: 0.6 10*3/uL (ref 0.1–1.0)
NEUTROS ABS: 3.5 10*3/uL (ref 1.7–7.7)
NEUTROS PCT: 62 % (ref 43–77)
Platelets: 188 10*3/uL (ref 150–400)
RBC: 4.65 MIL/uL (ref 3.87–5.11)
RDW: 13.7 % (ref 11.5–15.5)
WBC: 5.7 10*3/uL (ref 4.0–10.5)

## 2015-03-15 LAB — TROPONIN I: Troponin I: <0.03 ng/mL (ref <0.031)

## 2015-03-15 LAB — BRAIN NATRIURETIC PEPTIDE: B NATRIURETIC PEPTIDE 5: 69 pg/mL (ref 0.0–100.0)

## 2015-03-15 MED ORDER — FUROSEMIDE 10 MG/ML IJ SOLN
40.0000 mg | Freq: Once | INTRAMUSCULAR | Status: AC
  Administered 2015-03-15: 40 mg via INTRAVENOUS
  Filled 2015-03-15: qty 4

## 2015-03-15 NOTE — ED Notes (Signed)
Patients family reports patient is highly allergic to the ekg pads. Removed pads from patient at family's request.

## 2015-03-15 NOTE — Telephone Encounter (Signed)
Spoke with pt's daughter, c/o weakness, congestion on L side of sinuses.  States she feels like her inhalers are no longer working for her. Pt is already scheduled with SN at 11:30 tomorrow, but wants to know if pt needs to fast until appt for labs.    Pt's daughter is requesting to hear back this afternoon.    SN please advise.  Thanks!

## 2015-03-15 NOTE — Progress Notes (Signed)
EDCM spoke to patient and her daughter at bedside.  Patient confirms she lives alone.  Patient's daughter does not live too far away per daughter.  Patient reports she is able to perform her own ADL's without difficulty.  Patient's daughter takes care of patient's laundry.  EDCM asked patient and her daughter if they would be interested in home health services?  Per patient's daughter, home health services were arranged by patient's pcp but the agency called the patient late at night which upset the patient and then the agency called the patient's daughter.  Patient's daughter then cancelled home health services with that agency.  Patient and patient's daughter politely refused home health services.  Patient's daughter rpeorts she [facility] and are willing to pay out of pocket.  Patient ambulating with a cane at home.  Patient's daughter lays out patient's medications for her, but patient doesn't like to take them all at once so she spaces them out.  Patient confirms her pcp is Dr. Alroy [facility]adel [facility].  EDCM provided patient's daughter with [place], list of private duty nursing agencies and information about [facility]  Patient's daughter would not allow EDCM to place referral [facility].  [facility] to speak to patient.  No further EDCM needs at this time.

## 2015-03-15 NOTE — ED Notes (Signed)
Bed: RESB Expected date:  Expected time:  Means of arrival:  Comments: EMS/3F/SOB/WEAKNESS

## 2015-03-15 NOTE — Discharge Instructions (Signed)
Panic Attacks °Panic attacks are sudden, short feelings of great fear or discomfort. You may have them for no reason when you are relaxed, when you are uneasy (anxious), or when you are sleeping.  °HOME CARE °· Take all your medicines as told. °· Check with your doctor before starting new medicines. °· Keep all doctor visits. °GET HELP IF: °· You are not able to take your medicines as told. °· Your symptoms do not get better. °· Your symptoms get worse. °GET HELP RIGHT AWAY IF: °· Your attacks seem different than your normal attacks. °· You have thoughts about hurting yourself or others. °· You take panic attack medicine and you have a side effect. °MAKE SURE YOU: °· Understand these instructions. °· Will watch your condition. °· Will get help right away if you are not doing well or get worse. °Document Released: 12/08/2010 Document Revised: 08/26/2013 Document Reviewed: 06/19/2013 °ExitCare® Patient Information ©2015 [employer]. This information is not intended to replace advice given to you by your health care provider. Make sure you discuss any questions you have with your health care provider. ° °

## 2015-03-15 NOTE — Telephone Encounter (Signed)
Per SN- use saline nasal spray to help with sinus congestion and no fasting labs tomorrow.  Pt's daughter aware.  Nothing further needed.

## 2015-03-15 NOTE — ED Notes (Signed)
EMS pt from home, contacted today d/t patients complaint sob starting yesterday, and generalized weakness. Pt has a hx of anxiety and family has been speaking recently about placement in ALF.

## 2015-03-15 NOTE — ED Provider Notes (Signed)
CSN: [id number]     Arrival date & time 03/15/15  1753 History   First MD Initiated Contact with Patient 03/15/15 1831     Chief Complaint  Patient presents with  . Shortness of Breath  . Fatigue     (Consider location/radiation/quality/duration/timing/severity/associated sxs/prior Treatment) HPI Comments: Patient presents to the ER for evaluation of shortness of breath. Patient reportedly has a history of shortness of breath secondary to COPD and also anxiety. She has, however, recently been noted to have increasing swelling of both lower extremities. She has not been sleeping well, reports that she sleeps on extra pillows and at times sleeps in a recliner because of her shortness of breath. She is not experiencing any chest pain associated with the symptoms.  Patient is a 81 y.o. female presenting with shortness of breath.  Shortness of Breath   Past Medical History  Diagnosis Date  . Shortness of breath   . Unspecified essential hypertension   . Right bundle branch block   . Other and unspecified hyperlipidemia   . Other abnormal glucose   . Diverticulosis of colon (without mention of hemorrhage)   . Pyelonephritis, unspecified   . Solitary cyst of breast   . Osteoarthrosis, unspecified whether generalized or localized, unspecified site   . Lumbago   . Spinal stenosis, unspecified region other than cervical   . Anemia, unspecified   . GERD (gastroesophageal reflux disease)   . Pyelonephritis   . DJD (degenerative joint disease)   . UTI (lower urinary tract infection)    Past Surgical History  Procedure Laterality Date  . Cataract extraction    . Right hip hemiarthroplasty      total  . Conversion to right thr     Family History  Problem Relation Age of Onset  . Cancer Brother   . Cancer Brother    History  Substance Use Topics  . Smoking status: Never Smoker   . Smokeless tobacco: Never Used  . Alcohol Use: No   OB History    No data available     Review  of Systems  Respiratory: Positive for shortness of breath.   Cardiovascular: Positive for leg swelling.  All other systems reviewed and are negative.     Allergies  Azithromycin; Ciprofloxacin; Levofloxacin; Latex; and Other  Home Medications   Prior to Admission medications   Medication Sig Start Date End Date Taking? Authorizing Provider  ADVAIR DISKUS 100-50 MCG/DOSE AEPB Inhale 1 puff into the lungs as needed (shortness of breath).  08/07/14  Yes Historical Provider, MD  albuterol (PROVENTIL HFA;VENTOLIN HFA) 108 (90 BASE) MCG/ACT inhaler Inhale 2 puffs into the lungs every 6 (six) hours as needed for wheezing or shortness of breath.   Yes Historical Provider, MD  Alum & Mag Hydroxide-Simeth (MAGIC MOUTHWASH W/LIDOCAINE) SOLN Take 5 mLs by mouth 4 (four) times daily as needed for mouth pain. 01/06/15  Yes Michele Mcalpine, MD  aspirin 81 MG tablet Take 81 mg by mouth daily.    Yes Historical Provider, MD  ferrous sulfate 325 (65 FE) MG tablet Take 325 mg by mouth daily with breakfast.   Yes Historical Provider, MD  furosemide (LASIX) 20 MG tablet Take 1 tablet (20 mg total) by mouth daily. 01/26/15  Yes Michele Mcalpine, MD  Menthol, Topical Analgesic, (BIOFREEZE) 4 % GEL Apply 1 application topically daily as needed (pain).    Yes Historical Provider, MD  Multiple Vitamin (MULTIVITAMIN WITH MINERALS) TABS tablet Take 1 tablet by mouth  daily. Centrum Silver   Yes Historical Provider, MD  NEXIUM 40 MG capsule Take 40 mg by mouth 2 (two) times daily before a meal.  03/08/15  Yes Historical Provider, MD  NORVASC 5 MG tablet TAKE 1 TABLET ONCE DAILY. 12/24/14  Yes Michele Mcalpine, MD  polyethylene glycol [facility] / GLYCOLAX) packet Take 17 g by mouth daily as needed for mild constipation.    Yes Historical Provider, MD  potassium chloride (K-DUR,KLOR-CON) 10 MEQ tablet Take 1 tablet (10 mEq total) by mouth daily. 11/16/14  Yes Michele Mcalpine, MD  predniSONE (DELTASONE) 20 MG tablet Take 10 mg by mouth  daily with breakfast. Changed 3.21.16   Yes Historical Provider, MD   BP 126/66 mmHg  Pulse 87  Temp(Src) 98.1 F (36.7 C) (Oral)  Resp 16  SpO2 94% Physical Exam  Constitutional: She is oriented to person, place, and time. She appears well-developed and well-nourished. No distress.  HENT:  Head: Normocephalic and atraumatic.  Right Ear: Hearing normal.  Left Ear: Hearing normal.  Nose: Nose normal.  Mouth/Throat: Oropharynx is clear and moist and mucous membranes are normal.  Eyes: Conjunctivae and EOM are normal. Pupils are equal, round, and reactive to light.  Neck: Normal range of motion. Neck supple.  Cardiovascular: Regular rhythm, S1 normal and S2 normal.  Exam reveals no gallop and no friction rub.   No murmur heard. Pulmonary/Chest: Effort normal and breath sounds normal. No respiratory distress. She exhibits no tenderness.  Abdominal: Soft. Normal appearance and bowel sounds are normal. There is no hepatosplenomegaly. There is no tenderness. There is no rebound, no guarding, no tenderness at McBurney's point and negative Murphy's sign. No hernia.  Musculoskeletal: Normal range of motion. She exhibits edema.  Neurological: She is alert and oriented to person, place, and time. She has normal strength. No cranial nerve deficit or sensory deficit. Coordination normal. GCS eye subscore is 4. GCS verbal subscore is 5. GCS motor subscore is 6.  Skin: Skin is warm, dry and intact. No rash noted. No cyanosis.  Psychiatric: She has a normal mood and affect. Her speech is normal and behavior is normal. Thought content normal.  Nursing note and vitals reviewed.   ED Course  Procedures (including critical care time) Labs Review Labs Reviewed  COMPREHENSIVE METABOLIC PANEL - Abnormal; Notable for the following:    Potassium 3.0 (*)    Glucose, Bld 101 (*)    GFR calc non Af Amer 51 (*)    GFR calc Af Amer 59 (*)    Anion gap 4 (*)    All other components within normal limits  CBC  WITH DIFFERENTIAL/PLATELET  URINALYSIS, ROUTINE W REFLEX MICROSCOPIC  TROPONIN I  BRAIN NATRIURETIC PEPTIDE    Imaging Review Dg Chest 2 View  03/15/2015   CLINICAL DATA:  Lower extremity edema and shortness of breath for 1 day  EXAM: CHEST  2 VIEW  COMPARISON:  November 22, 2014  FINDINGS: There is no edema or consolidation. Heart size and pulmonary vascularity are normal. No adenopathy. There is stable eventration of the right hemidiaphragm. There is atherosclerotic change in the aorta.  IMPRESSION: No edema or consolidation.  Stable eventration right hemidiaphragm.   Electronically Signed   By: Bretta Bang III M.D.   On: 03/15/2015 20:32     EKG Interpretation   Date/Time:  Tuesday March 15 2015 19:16:28 EDT Ventricular Rate:  72 PR Interval:  274 QRS Duration: 126 QT Interval:  438 QTC Calculation: 479 R  Axis:   -62 Text Interpretation:  Sinus rhythm Atrial premature complex Prolonged PR  interval Probable left atrial enlargement RBBB and LAFB No significant  change since last tracing Confirmed by Roselin Wiemann  MD, Markela Wee [phone]([id number]) on  03/15/2015 7:49:14 PM      MDM   Final diagnoses:  Shortness of breath   anxiety  Patient presents to the ER for evaluation of shortness of breath. Patient reports a history of COPD. She does not have any active wheezing, is not hypoxic here in the ER. Vital signs are stable. She does report increasing swelling of her legs. She was recently switched from hydrochlorothiazide to Lasix. This has not helped. She does have pitting edema of both lower extremities, but there is no rales or rhonchi on examination. No JVD. BNP is normal. Chest x-ray is clear. Some of the swelling of her legs may be secondary to chronic steroid use from a history of chronic skin disorder.  I suspect that some of her symptoms are secondary to anxiety. She is doing well here in the ER. Daughter is trying to get her degree to go to the nursing home. She has a bed available  for her for tomorrow. I do not find anything that would require hospitalization at this time. She will be discharged. Social work consult to help [place] with placement.    Gilda Creasehristopher J Rose Hippler, MD 03/15/15 2110

## 2015-03-15 NOTE — Progress Notes (Signed)
CSW met with pt. Daughter was present. Patient informed CSW that she was BIB EMS. She states that she presents to [facility] due to breathing problems.  Patient and daughter informed CSW that the pt lives home alone in [place]. Patient informed CSW that she can complete her ADL's independently. Patient states that she does not fall often and that she has not fallen within the past 6 months.   Patient informed CSW that she is not interested in a facility. However, she states that her daughter believes it would be best that she go to a facility.  CSW gave daughter a list of SNF.  Willette Brace [phone] ED CSW 03/15/2015 10:15 PM

## 2015-03-16 ENCOUNTER — Ambulatory Visit: Payer: Medicare Other | Admitting: Pulmonary Disease

## 2015-03-22 ENCOUNTER — Ambulatory Visit (INDEPENDENT_AMBULATORY_CARE_PROVIDER_SITE_OTHER): Payer: Medicare Other | Admitting: Pulmonary Disease

## 2015-03-22 ENCOUNTER — Encounter: Payer: Self-pay | Admitting: Pulmonary Disease

## 2015-03-22 VITALS — BP 148/70 | HR 91 | Temp 97.8°F | Wt 175.2 lb

## 2015-03-22 DIAGNOSIS — K219 Gastro-esophageal reflux disease without esophagitis: Secondary | ICD-10-CM

## 2015-03-22 DIAGNOSIS — M159 Polyosteoarthritis, unspecified: Secondary | ICD-10-CM

## 2015-03-22 DIAGNOSIS — R0602 Shortness of breath: Secondary | ICD-10-CM

## 2015-03-22 DIAGNOSIS — F419 Anxiety disorder, unspecified: Secondary | ICD-10-CM

## 2015-03-22 DIAGNOSIS — R609 Edema, unspecified: Secondary | ICD-10-CM

## 2015-03-22 DIAGNOSIS — K5900 Constipation, unspecified: Secondary | ICD-10-CM

## 2015-03-22 DIAGNOSIS — M545 Low back pain, unspecified: Secondary | ICD-10-CM

## 2015-03-22 DIAGNOSIS — I872 Venous insufficiency (chronic) (peripheral): Secondary | ICD-10-CM

## 2015-03-22 DIAGNOSIS — I4510 Unspecified right bundle-branch block: Secondary | ICD-10-CM | POA: Diagnosis not present

## 2015-03-22 DIAGNOSIS — I10 Essential (primary) hypertension: Secondary | ICD-10-CM | POA: Diagnosis not present

## 2015-03-22 DIAGNOSIS — M150 Primary generalized (osteo)arthritis: Secondary | ICD-10-CM

## 2015-03-22 DIAGNOSIS — R269 Unspecified abnormalities of gait and mobility: Secondary | ICD-10-CM

## 2015-03-22 MED ORDER — CLONAZEPAM 0.5 MG PO TABS: 0.2500 mg | ORAL_TABLET | Freq: Two times a day (BID) | ORAL | Status: DC | PRN

## 2015-03-22 MED ORDER — POTASSIUM CHLORIDE CRYS ER 10 MEQ PO TBCR: 20.0000 meq | EXTENDED_RELEASE_TABLET | Freq: Every day | ORAL | Status: DC

## 2015-03-22 NOTE — Progress Notes (Signed)
Subjective:    Patient ID: [name], female    DOB: 15-Oct-1923, 81 y.o.   MRN: [id number]  HPI 81 y/o WF here for a follow up visit... she has multiple medical problems as noted below...  Followed for general medical purposes w/ hx chr obstructive asthma, HBP, RBBB, Hypercholesterolemia, borderline DM, DJD, LBP w/ sp stenosis, etc... ~  SEE PREV EPIC NOTES FOR THE OLDER DATA >>    CTAngio Chest 05/2009 showed no evid of PE, biapical pleuroparenchymal scarring otherw clear lungs, no adenopathy/ effusions/ etc...  ~  February 15, 2014:  [name] w/ incr SOB/DOE walking & Qhs, swelling in legs, etc; CXR & Labs were ok; she changed her Hct to Lasix & improved... We reviewed the following medical problems during today's office visit >>     Chr Obstructive Asthma> stable on Advair100 (but only using it prn) & Proventil prn; notes breathing back to baseline & Prn Klonopin helps dyspnea...    HBP> on ASA81, Norvasc5, Lasix20- 1-2/d; BP=136/70, tol meds well; denies CP, palpit, ch in SOB, but notes persist edema in legs; Rec to ch Amlod5 to Losar50, low sodium, elev legs, Lasix40...    CHOL> on diet alone, refuses meds, FLP 3/14 shows TChol 177, TG 50, HDL 60, LDL 107    DM> on diet alone, wt up slightly to  ~179#, BS=95, last A1c (2/13) was 6.3 & she knows to restrict carbs etc...    GI- Reflux, Divertics, constip> on Protonix40, Miralax, Senakot-S; continue same meds..[name]   DJD/ LBP> on Celebrex & osteobiflex prn; had right THR 2011; known sp stenosis w/ prev ESI... We reviewed prob list, meds, xrays and labs> see below for updates >>   CXR 3/15 showed norm heart size, clear lungs, elev of right hemidiaph, NAD...  LABS 3/15:  Chems- wnl;  CBC- wnl;  BNP=26...  ~  Apr 14, 2014:  226moOV & add-on appt requested for "indigestion"> difficult hx but pt appears to be c/o swallowing difficulty, dysphagia, heartburn, & indigestion according to the daugh; she's had gas, belching,  hoarseness, SOB, and early satiety; also notes constipation & took mag citrate for relief;  Epic review indicates hx indigestion & reflux symptoms- treated by [name] w/ PPI, she had EGD & Colon 2002;  Notes food sticking in mid-esoph w/ pain, nausea despite her Prevacid30 qam...  We decided to increase the PPI- Prevacid30Bid and refer to [name] for EGD and dilatation....    She is also c/o incr ankle edema; on Lasix20, no salt, etc;  We discussed increase diuretic to 4035mm and re-double efforts at sodium restriction, elevation, support hose, etc...     BP remains stable ooff Amlod, on Losar50, Lasix40;  BP= 138/80 today & she denies angina pain, CHF, etc...  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     She also had some ankle edema while on Lasix20 and was told no salt, incr Lasix to 36m/d & elevate/ support hose/ etc... Once again daughter has changed her meds based on perceived side effects ("causing head spells- I feel my heart in my head"); they stopped the Lasix & she does not want to restart- asking to go back on prev meds= Amlod5 & HCT12.5 daily, OK...    Similarly she as Klonopin 0.578mto take 1/2 to 1 tab as needed for dyspnea or anxiety, and she tells me she hasn't even tried it one time!  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  I have again recommended that the pt try to increase her Klonopin from 0.19m41mhs to one Bid or perhaps try 1/2 in AM, 1/2 in afternoon, and 1 at bedtime...     Finally the daughter asked about ordering some Physical Therapy for her mother; they feel she is getting weaker esp in legs, slower, and balance is off; we reviewed the need for exercise and we will request PT consult & their involvement We reviewed prob list, meds, xrays and labs> see below for updates >> OK 2015 flu vaccine today  ~  September 28, 2014:  4mo37mo & [name] says "I'm not doing well"; c/o skin rash- daughter thinks it's the EKG tabs (but she has rash on lowe back/ buttock), states "allergic to elastic", she has seen Derm- [name] on 3 diff creams (currently on clobetasol0.05% & Eurcin calming lotion); they did Bx= "looked ok, it was neg";  [name] is apparently considering an immunosuppressant med 7 want her checked for Tb- we will send Quantiferon Gold...     She saw [name] for GI> nothing avail in EPIC yet but daughter states [name] did MBS& [name] rec warm liquids w/ her meals & she is 90% better decr burbs, decr gas, swallowing better; pt stopped Nexium on her own & refuses restart...    On Advair100, AlbutHFA, MMW> all being used just prn & she states breathing is 90% better...    On Amlod5 &  HCT12.5; BP= 140/70 w/o CP, palpit, SOB, edema...     She is getting outpt PT now- helping she says...    She is off the Klonopin & refuses to restart this med (daugh states she never really took the first pill)... We reviewed prob list, meds, xrays and labs> see below for updates >>  PLAN>> we wrote for Atarax 10mg57m prn itching; we sent Quantiferon gold- NEG...   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, etc;  She has mult somatic complaints- good days & bad, hard to sort out the salient features, I have prev rec a low dose Klonopin Bid to help w/ this & her dyspnea but daugh states she has never taken it, we discussed this again;  CC is leg pain- difficulty walking, uses cane, known spinal stenosis, she's had PT, she's had shots and encouraged to f/u w/ drRamos about this; not on pain meds "I use natural products";  Also c/o ?dyspnea as before, phlegm in throat, "attacks" of swallowing trouble which we have investigated w/ Ba swallow/ UGI, speech path eval, etc & she is rec to take Nexium 40Bid; daughter states she occas has to drive her around in her car w/ window 1/2 open to get relief (I again rec use of the Klonopin); offered GI eval w/ [name] or [name] & they will decide; daugh notes that they have tried everything for the phlegm in throat including Mucinex, Rock&Rye, Rawleigh's salve, Vicks vaporub, Slipp[place]m lozenges, rolaids, warm honey & lemon, MMW; they wonder about food allergy & they are encouraged to f/u w/ GI;  Finally they mention her rash- sm patch on her back over sacrum & she was tried on several creams per DrJones- intol to some of them- still on Prednisone 44m/d &  she needs to wean this off, they have f/u appt in several days...     There have been mult phone calls in the interim> feeling weak, SOB, edema; daugh states she not taking her Advair, Klonopin, on HCT25-1/2 daily, they didn't want to incr meds but requested Cards eval for "CHF"-ok...    She saw DCherly Hensen12/30/15 but wouldn't allow EKG due to "allergy to the electrodes"; his note is reviewed- [name] agreed w/ ARB/diuretic but noted pts refusal & she wanted to be back on low dose Amlod; no signs of CHF- they did 2DEcho showing norm LV size & function w/ EF=65-70%, AoV leaflets mildly thickened w/o AS, MV leaflets mod thickened w/ trivMR, mild RA dil, PAsys=317mg; no change in meds...     She went to the ED 11/22/14 w/ a choking episode, SOB, altered mental status> note reviewed- all symptoms resolved spont; they did CXR- NAD; and Blood work- wnl...    Derm eval [name] w/ Dx of bullous pemphigoid- on Pred 2016m & they understand that this can contrib to edema; advised 2gm Na diet etc; they do not want stronger diuretic...  We reviewed prob list, meds, xrays and labs> see below for updates >>   CXR 1/16 showed norm heart size, clear lungs, mild right diaph eventration- no change, Tspine DJD. DISH/ osteopenia; NAD...   2DEcho 1/16 showed norm LV size & function w/ EF=65-70%, AoV leaflets mildly thickened w/o AS, MV leaflets mod thickened w/ trivMR, mild RA dil, PAsys=39m39m..  LABS 1/16:  Chems- wnl w/ Cr=0.95;  BNP=66;  CBC- wnl w/ Hg=14.4...  ~  January 26, 2015:  6wk ROV & MargYuleidyurns w/ her daughter ShirPearlean Browniee appears stable and has no new complaints but retains mult minor somatic complaints as usual; the biggest issue is disposition & personal family matters; MargLevy Sjogrentinues to live in her house alone, daughter does a great job of caring for her but it is wearing her out & pt is not always cooperative... They have looked into AL vs  skilled care at several facities but they have not been to her  satisfaction;  We will try for a [facility] home assessment & i told them i would be happy to assist in any way possible...     She has gained 9# & has 1-2+ pitting edema in LEs; daughter states she will only eat [employer] food "I like it" & we reviewed low sodium options; we decided to chenage her HCT25-1/2 tab to [place] per day...    She needs to increase her exercise program    We reviewed prob list, meds, xrays and labs> see below for updates >>  PLAN>> they will continue to look into AL situations; we will request [facility] home health assessment; change HCT to LASIX13m Qam, no salt, elev legs, try support hose...  ~  Mar 22, 2015:  229moOV & Brinkley went to the ER 03/15/15> Notes reviewed- presented w/ SOB, noted incr swelling, not resting well; Exam showed Afeb, VSS, O2sat=94%, Lungs clear no wheezing, Cardiac exam neg, +edema in feet; CXR showed no edema, NAD; Labs- ok x K=3.0 (she is supposed to be on K20/d);  They gave her Lasix40 in the ER and they wondered about anxiety; Daughter indicates that she had been anxious after she pulled the cord off a ceiling fan & could not fix it; she also states that mother is not regular w/ her meds "she takes what she wants to take" but pt says "I take it when I think I need it"; they cannot agree on placement, they declined home health services; she has Klonopin 0.33m1mo take but pt won't take it> daugh says she stays outside on back porch til ~9pm daily ("I breath better outside") then goes in to get ready for bed...     EXAM shows Afeb, VSS, O2sat=93% on RA, wt is down 3#;  Chest- clear w/o w/r/r;  Heart- RR w/o m/r/g;  Ext- neg x trace edema...   CXR 4/16 in ER> norm heart size, atherosclerosis of Ao, no edema or consolidation in lungs, eventration of right hemidiaph- no change, DJD spine.  EKG 4/16 in ER> NSR, rate72, PACs, LAD, RBBB  LABS 4/16 in ER> Chems- wnl x K=3.0;  CBC- wnl;  Troponin=neg;  BNP=69 PLAN>>  I have instructed pt & daugh to increase her KCl  to 14m34mid & take it regularly; she is encouraged to try the Klonopin 1/2 tab bid regularly for [name] anxiety...           Problem List:       DYSPNEA (ICD-786.05) - long hx of chronic obstructive asthma treated w/ ADVAIR100Bid & PROAIR (she uses them Prn now)... she is a non-smoker w/ some reactive airways disease in the past & retired from [employer] years in 1991... she denies cough, sputum, hemoptysis, worsening dyspnea, wheezing, chest pains, snoring, daytime hypersomnolence, etc...  ~  baseline CXR w/o acute changes...  ~  PFT's 5/02 w/ FVC 2.07 (68%), FEV1=1.36 (57%), and FEV1/FVC ratio=66%, mid-flows 42%... ~  CT Angio 7/10 was neg- x biapical pleuroparenchymal scarring... ~  CXR 10/11 showed sl elev right hemidaiph, mild DJD sp, osteopenia, NAD... ~[name] CXR 6/12 showed mild apical scarring, clear & NAD, DJD sp w/ osteophytes... ~  Intermittent dyspnea more related to anxiety & treated w/ KLONOPIN 0.33mg 2m to 1 tab Bid==> improved. ~  CXR 4/13 showed normal heart size, clear lungs, DJD in TSpine... ~  CXR 2/14 showed normal heart size, clear lungs w/ sl peribronch thickening,  DJD in spine, NAD.[name]. ~  CXR 3/15 showed norm heart size, clear lungs, elev of right hemidiaph, NAD... ~  She is encouraged to take the Klonopin 0.64m Bid regularly- consider taking 1/2 in AM, 1/2 in afternoon, one at bedtime... ~  1/16: she continues w/ mult somatic complaints and intermittent choking episodes etc; she refuses to take the Advair or Klonopin... ~  CXR 1/16 showed norm heart size, clear lungs, mild right diaph eventration- no change, Tspine DJD. DISH/ osteopenia; NAD... ~  3/16: she is stable on current meds, asked to incr her non-existent exercise program... ~  4/16: went to ER w/ SOB- nothing found x mild edema; given Lasix40, potassium was low, thought to be anxious; in office f/u her daugh confirms anxiety & pt not taking her meds regularly; asked to take meds every day & try the  Klonopin...  HYPERTENSION (ICD-401.9) - controlled on NORVASC 574mdaily & HCTZ 2572mb daily...  ~  2/13:  BP 144/70 today> tol rx well & denies HA, visual changes, CP, palipit, dizziness, syncope, edema, etc... ~  4/13:  BP= 148/80 & she denies CP, palpit, edema; dyspnea improved w/ Klonopin. ~  6/13:  BP= 132/68 & as noted she has mult somatic complaints... ~  3/14:  on Norvasc10, HCTZ25; BP=136/60, tol meds well; denies CP, palpit, ch in SOB, edema, etc  ~  9/14:  BP controlled on Amlod5 & Hct25-1/2 daily; BP= 130/70 & she denies CP, palpit, dizzy, SOB, edema, etc. ~  3/15: on ASA81, Norvasc5, Lasix20- 1-2/d; BP=136/70, tol meds well; denies CP, palpit, ch in SOB, but notes persist edema in legs; Rec to ch Amlod5 to Losar50, low sodium, elev legs, Lasix40. ~  5/15: on Losar50, Lasix40;  BP= 138/60 & she denies angina pain, palpit, ch in SOB, etc... ~  7/15: on Losar50, she stopped Lasix; BP= 138/78 and they want to go back on Amlod5 + HCT12.5 daily... ~  9/15: on Amlod5, Hct12.5; BP= 128/80 & she likes this combo better- denies CP, palpit, etc... ~  1/16: on Amlod5, Hct12.5 but ?what she is taking; BP= 130/80 & she has refused the ARBs... ~  3/16: on Amlod5, Hct12.5; BP= 142/80 & she has gained 9# w/ 1-2+ edema; discussed low sodium & change Hct to LASIX20 Qam... ~  5/16: on Amlod5, Lasix20, K20; BP=148/70, recent K=3.0 & daugh notes she's not taking meds regularly; asked to take meds everyday & incr K20Bid...  RIGHT BUNDLE BRANCH BLOCK (ICD-426.4) - on ASA 87m86m.. baseline EKG w/ RBBB and 2DEcho 5/02 showed mild asymmetric LVH w/ incr EF... ~  12/15: she had Cards eval by DrNishan> [name] rec ARB/diuretic but she refused to switch from her Amlod5/ Hct12.5 regimen; she refused EKG due to "allergy to the electrodes"... ~  2DEcho 1/16 showed norm LV size & function w/ EF=65-70%, AoV leaflets mildly thickened w/o AS, MV leaflets mod thickened w/ trivMR, mild RA dil, PAsys=37mm45m. ~  EKG 4/16  showed NSR, rate72, PACs, LAD, RBBB  VENOUS INSUFFIC & EDEMA >>  ~  3/15: she was switched off Amlod & onto Losar50, Lasix20=>40, plus no salt, elevation, support hose, etc...  ~  9/15: they [facility] the [facility]illac[facility]12.5 regimen; she has VI & 1+edema but stable, no acute changes... 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 ~  3/16: she remains on the Pred from Derm, now w/ incr edema & 9# wt gain, rec no salt & change  Hct to LASIX20/d... ~  5/16: improved on Lasix20 w/ edema decr 7 wt down several lbs...  HYPERLIPIDEMIA (ICD-272.4) - on diet alone... she forgets to come to visits FASTING for this blood work ~  [place] 8/07 showed TChol 205, TG 71, HDL 49, LDL 130... ~  FLP 6/12 on diet alone showed TChol 218, TG 50, HDL 66, LDL 129 ~  FLP 2/13 on diet alone showed TChol 171, TG 43, HDL 66, LDL 97 ~  FLP 3/14 on diet alone showed TChol 177, TG 50, HDL 60, LDL 107 ~  She needs to ret FASTING for f/u FLP...  DIABETES MELLITUS, BORDERLINE (ICD-790.29) - on diet alone w/ prev BS's in the 100-160 range... ~  labs in 2008-9 showed BS= 101 to 108 ~  labs 1/10 showed BS= 106, A1c= 5.9 ~  Labs 6/12 showed BS= 93, A1c= 6.6.Marland KitchenMarland Kitchen rec diet, exercise... ~  Labs 2/13 showed BS= 92, A1c= 6.3 ~  3/14: on diet alone, wt stable ~174#, BS=97, last A1c (2/13) was 6.3 & she knows to restrict carbs etc. ~  Labs 3/15 showed BS= 95; and BS= 120 in [id number]...  ~  Labs 2016 showed BS= 101-135  INDIGESTION/ REFLUX SYMPTOMS >> see 10/12 note & PROTONIX 71m/d started, further eval if symptoms persist... ESOPHAGEAL DYSMOTILITY/ PRESBYESOPHAGUS >>  ~  4/13:  She notes some reflux symptoms and excess gas w/ belching; rec to take the Protonix daily & Simethacone vs Tums which she says helps her gas. ~  5/13:  She saw GI [name] w/ rec to take Prilosec for her indigestion... ~  5/15:  She presented w/ worsening indigestion, dysphagia, reflux & Prev30 was incr to Bid w/ GI f/u  suggested for EGD... 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 ~  9/15:  Symptoms persist but she is not regurg or vomiting, and weight stable; they want to change to Nexium40Bid-OK, and rec proceed w/ MBS by speech path... ~  She continues to have intermit choking episodes and c/o phlegm in her throat; she has tried "everything" & encouraged to f/u w/ GI- [name]/ [name]...  DIVERTICULOSIS OF COLON (ICD-562.10) - she takes SENAKOT-S, MIRALAX, Peppermint Tea, & sauerkraut Prn...last colonoscopy 9/02 by [name] was WNL...  PYELONEPHRITIS (ICD-590.80) - SEE 1/09 Hospitalization (reviewed)... ~  She saw DrMacDiarmid for her recurrent UTIs, chronic cystitis, urge & stress incont, nocturia; she is INTOL to CGwynn..  Hx of BREAST CYST (ICD-610.0)  DEGENERATIVE JOINT DISEASE (ICD-715.90) - s/p right hip hemiarthroplasty 11/09 by DrAplington w/ wound complic... then dx w/ loosening of the femoral shaft & had conversion to right THR by DrAlusio 10/11 & much improved... she uses CELEBREX 2064mPrn (seldom takes this).  LOW BACK PAIN SYNDROME (ICD-724.2) & SPINAL STENOSIS (ICD-724.00) - severe LBP & spinal stenosis w/ evals by DrRamos & DrNudelman... s/p shots, considering poss surgery vs alternative therapies... she takes Celebrex, Osteobiflex, MVI, Vit D... ~  8/10: eval by DrAplington- diff leg lengths, lift placed in right shoe, then trial Lyrica508m. ~  12/11:  improved after hip revision surg (to THR) 10/11 w/ better ambulaton... ~  5/13:  DrRamos gave her another ESI for her leg  pain related to sp stenosis... ~  3/14:  C/o neuropathic discomfort in legs- eval by Ramos & offered shots in  her back; try Lyrica50 in the interim... ~  Persistent leg pain, on OTC "natural" meds she says; encouraged to f/u w/ DrRamos et al...  Hx of ANEMIA (ICD-285.9) - eval by GI in 2002 showed normal EGD and Colon... prob iron malabsorption problem Rx'd w/ Fe infusion... ~  labs 1/10 showed Hg= 14.6, MCV= 89, Fe= 94 ~  labs 12/11 showed Hg= 12.8, MCV= 90, Fe= 33... try Fe supplement + VitC... ~  Labs 6/12 showed Hg= 15.0 ~  Labs 2/13 showed Hg= 14.6 ~  Labs 2/14 showed Hg= 14.8 ~  Labs 3/15 showed Hg= 14.9 ~  Labs 1/16 showed Hg= 14.4 ~  Labs 4/16 showed Hg= 13.7  DERM:  rash Rx'd by dermatology- OLUX-E foam= clobetasol Foam 0.05%... 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 ~  5/16: she is still on Pred per Derm- currently 15m/d...    Past Surgical History  Procedure Laterality Date  . Cataract extraction    . Right hip hemiarthroplasty      total  . Conversion to right thr      Patient's Medications  >>  MEDS as of 03/22/15...  Previous Medications   ADVAIR DISKUS 100-50 MCG/DOSE AEPB    Inhale 1 puff into the lungs daily.    ALBUTEROL (PROVENTIL HFA;VENTOLIN HFA) 108 (90 BASE) MCG/ACT INHALER    Inhale 2 puffs into the lungs every 6 (six) hours as needed for wheezing or shortness of breath.   ALUM & MAG HYDROXIDE-SIMETH (MAGIC MOUTHWASH W/LIDOCAINE) SOLN    Take 5 mLs by mouth 4 (four) times daily as needed for mouth pain.   ASPIRIN 81 MG TABLET    Take 81 mg by mouth daily.    FERROUS SULFATE 325 (65 FE) MG TABLET    Take 325 mg by mouth daily with breakfast.   FUROSEMIDE (LASIX) 20 MG TABLET    Take 1 tablet (20 mg total) by mouth daily.   MENTHOL, TOPICAL ANALGESIC, (BIOFREEZE) 4 % GEL    Apply 1 application topically daily as needed (pain).    MULTIPLE VITAMIN (MULTIVITAMIN WITH MINERALS) TABS TABLET    Take 1 tablet by mouth daily. Centrum Silver   NEXIUM 40 MG CAPSULE    Take 40 mg by mouth 2 (two) times daily  before a meal.    NORVASC 5 MG TABLET    TAKE 1 TABLET ONCE DAILY.   POLYETHYLENE GLYCOL (MIRALAX / GLYCOLAX) PACKET    Take 17 g by mouth daily as needed for mild constipation.    POTASSIUM CHLORIDE (K-DUR,KLOR-CON) 10 MEQ TABLET    Take 1 tablet (10 mEq total) by mouth daily.   PREDNISONE (DELTASONE) 20 MG TABLET    Take 10 mg by mouth daily with breakfast. Changed 3.21.16  Modified Medications   No medications on file  Discontinued Medications   No medications on file    Allergies  Allergen Reactions  . Azithromycin Shortness Of Breath    Trouble breathing  . Ciprofloxacin Other (See Comments)    REACTION: hallucinations  . Levofloxacin Other (See Comments)    Insomnia, indigestion, tingling sensation in legs  . Latex Rash  . Other Rash    EKG leads caused a rash that required steroids to clear    Current Medications, Allergies, Past Medical History, Past Surgical History,  Family History, and Social History were reviewed in [employer] record.    Review of Systems         See HPI - all other systems neg except as noted... The patient complains of decreased hearing, dyspnea on exertion, muscle weakness, and difficulty walking.  The patient denies anorexia, fever, weight loss, weight gain, vision loss, hoarseness, chest pain, syncope, peripheral edema, prolonged cough, headaches, hemoptysis, abdominal pain, melena, hematochezia, severe indigestion/heartburn, hematuria, incontinence, suspicious skin lesions, transient blindness, depression, unusual weight change, abnormal bleeding, enlarged lymph nodes, and angioedema.     Objective:   Physical Exam     WD, WN, Chr ill appearing 81 y/o WF in NAD... GENERAL:  Alert & oriented; pleasant & cooperative... HEENT:  [place]/AT, EOM-full, EACs-clear, TMs-wnl, NOSE-clear, THROAT-clear & wnl. NECK:  Supple w/ fairROM; no JVD; normal carotid impulses w/o bruits; no thyromegaly or nodules palpated; no  lymphadenopathy. CHEST:  Clear to P & A; without wheezes/ rales/ or rhonchi heard... HEART:  Regular Rhythm; without murmurs/ rubs/ or gallops detected... ABDOMEN:  Soft & nontender; normal bowel sounds; no organomegaly or masses palpated... EXT:  mod arthritic changes, walks w/ cane, +venous insuffic & incr 1-2+ edema., scattered varicose veins... NEURO:  CN's intact; motor testing normal; no focal deficits... DERM:   mild intertrig rash under breast, & onychomycosis of toenails...  RADIOLOGY DATA:  Reviewed in the EPIC EMR & discussed w/ the patient...  LABORATORY DATA:  Reviewed in the EPIC EMR & discussed w/ the patient...   Assessment & Plan:    Episodes of choking & SOB>> see above, she has been resistent to trying any of the meds & treatment suggestions that we have given to her (I know she would improve on Benzo rx)...   DYSPNEA>  Hx asthma, stable off Advair, on Proair prn; hx anxiety component on Klonopin but she is not using!  HBP>  Controlled on Amlod5 + Lasix20, K20/d; but she is not taking meds regularly & labs 4/16 w/ K=3.0; therefore incr K20Bid...  RBBB>  Aware & denies CP, palpit, ch in DOE, etc... she thinks that she is allergic to EKG electrodes; EKG 4./16 in ER showed NSR, rate72, PACs, LAD, RBBB  CHOL>  On diet alone & FLP looks reasonable;  We reviewed low chol, low fat diet...  DM>  BS= 101-135 & last A1c is 6.3;  on diet alone & we reviewed low carb no sweets etc...  GI> Indigestion, Divertics> she notes most bowel symptoms resolved off spicey foods;  UGI symptoms w/ dysphagia, food sticking, belch/gas/ etc have not resolved on Protonix40Bid and after GI consult w/ [name]; we reviewed her UGI series results and decided to proceed w/ MBS by Speech Path; transiently improved then sought 2nd opinion from [name]=> on Nexium40Bid...  UTI>  Klebsiella UTI resolved after Septra Rx... She has been eval by DrMacDiarmid.  DJD, LBP, Spinal Stenosis>  Prev evals by  Ortho, DrRamos, DrNudelman etc; improved after THR w/ better ambulation; c/o neuropathic discomfort in legs- she will f/u w/ Ramos for shots, using OTC "natural" meds...  Anxiety>  If she would take the Klonopin, I feel it would help...   Patient's Medications  New Prescriptions   No medications on file  Previous Medications   ADVAIR DISKUS 100-50 MCG/DOSE AEPB    Inhale 1 puff into the lungs daily.    ALBUTEROL (PROVENTIL HFA;VENTOLIN HFA) 108 (90 BASE) MCG/ACT INHALER    Inhale 2 puffs into the lungs every 6 (six)  hours as needed for wheezing or shortness of breath.   ALUM & MAG HYDROXIDE-SIMETH (MAGIC MOUTHWASH W/LIDOCAINE) SOLN    Take 5 mLs by mouth 4 (four) times daily as needed for mouth pain.   ASPIRIN 81 MG TABLET    Take 81 mg by mouth daily.    FERROUS SULFATE 325 (65 FE) MG TABLET    Take 325 mg by mouth daily with breakfast.   FUROSEMIDE (LASIX) 20 MG TABLET    Take 1 tablet (20 mg total) by mouth daily.   MENTHOL, TOPICAL ANALGESIC, (BIOFREEZE) 4 % GEL    Apply 1 application topically daily as needed (pain).    MULTIPLE VITAMIN (MULTIVITAMIN WITH MINERALS) TABS TABLET    Take 1 tablet by mouth daily. Centrum Silver   NEXIUM 40 MG CAPSULE    Take 40 mg by mouth 2 (two) times daily before a meal.    NORVASC 5 MG TABLET    TAKE 1 TABLET ONCE DAILY.   POLYETHYLENE GLYCOL (MIRALAX / GLYCOLAX) PACKET    Take 17 g by mouth daily as needed for mild constipation.    POTASSIUM CHLORIDE (K-DUR,KLOR-CON) 10 MEQ TABLET    Take 1 tablet (10 mEq total) by mouth daily.   PREDNISONE (DELTASONE) 20 MG TABLET    Take 10 mg by mouth daily with breakfast. Changed 3.21.16  Modified Medications   No medications on file  Discontinued Medications   No medications on file

## 2015-03-22 NOTE — Patient Instructions (Signed)
Today we updated your med list in our EPIC system...    Continue your current medications the same...  We decided to INCREASE the POTASSIUM pills to 2 of the 10mEq KCl tabs daily...  [facility]- try 1/2 tab once or twice daily to start...  Call for any questions...  Let's plan a follow up visit in 83mo, sooner if needed for problems.[name]..[name]

## 2015-03-28 ENCOUNTER — Ambulatory Visit: Payer: Medicare Other | Admitting: Pulmonary Disease

## 2015-04-08 ENCOUNTER — Telehealth: Payer: Self-pay | Admitting: Pulmonary Disease

## 2015-04-08 NOTE — Telephone Encounter (Signed)
Spoke with pt's daughter, states that forms for nursing facility were dropped off this morning and needs this filled out as soon as possible.  I advised that SN was out of the office until Tuesday.  She is ok with this and understands.  Daughter states that nursing home needs that form filled out as well as the last office note and a current medication list.  She will pick up these forms when they are completed.   Forwarding to [employer]ras[employer] to look out for forms.

## 2015-04-13 NOTE — Telephone Encounter (Signed)
Paperwork completed and left at front desk for daughter to pick up. Daughter aware. Nothing further is needed.

## 2015-05-04 ENCOUNTER — Telehealth: Payer: Self-pay | Admitting: Pulmonary Disease

## 2015-05-04 NOTE — Telephone Encounter (Signed)
Spoke with pt's daughter, is having a "breathing attack" again: states that pt cannot breathe in the house and needs fresh air to catch her breath.  Pt's daughter is requesting an appt asap.  Scheduled with RB tomorrow at 2:15.  Nothing further needed.

## 2015-05-05 ENCOUNTER — Ambulatory Visit: Payer: Medicare Other | Admitting: Emergency Medicine

## 2015-05-18 ENCOUNTER — Ambulatory Visit: Payer: Medicare Other | Admitting: Pulmonary Disease

## 2015-05-24 ENCOUNTER — Ambulatory Visit: Payer: Medicare Other | Admitting: Pulmonary Disease

## 2015-05-26 ENCOUNTER — Ambulatory Visit: Payer: Medicare Other | Admitting: Pulmonary Disease

## 2015-06-07 ENCOUNTER — Telehealth: Payer: Self-pay | Admitting: Pulmonary Disease

## 2015-06-07 DIAGNOSIS — B351 Tinea unguium: Secondary | ICD-10-CM

## 2015-06-07 DIAGNOSIS — R0600 Dyspnea, unspecified: Secondary | ICD-10-CM

## 2015-06-07 DIAGNOSIS — E538 Deficiency of other specified B group vitamins: Secondary | ICD-10-CM

## 2015-06-07 DIAGNOSIS — M810 Age-related osteoporosis without current pathological fracture: Secondary | ICD-10-CM

## 2015-06-07 DIAGNOSIS — K219 Gastro-esophageal reflux disease without esophagitis: Secondary | ICD-10-CM

## 2015-06-07 DIAGNOSIS — J438 Other emphysema: Secondary | ICD-10-CM

## 2015-06-07 DIAGNOSIS — M159 Polyosteoarthritis, unspecified: Secondary | ICD-10-CM

## 2015-06-07 DIAGNOSIS — F419 Anxiety disorder, unspecified: Secondary | ICD-10-CM

## 2015-06-07 DIAGNOSIS — I10 Essential (primary) hypertension: Secondary | ICD-10-CM

## 2015-06-07 DIAGNOSIS — D649 Anemia, unspecified: Secondary | ICD-10-CM

## 2015-06-07 DIAGNOSIS — M150 Primary generalized (osteo)arthritis: Secondary | ICD-10-CM

## 2015-06-07 DIAGNOSIS — R5382 Chronic fatigue, unspecified: Secondary | ICD-10-CM

## 2015-06-07 NOTE — Telephone Encounter (Signed)
Spoke with daughter/ pt scheduled to see SN 7/21 and is wanting pt to have lab work done. She is requesting pt get an urine analysis, iron level checked and get her "vitamin levels" checked as well. Please advise SN thanks

## 2015-06-08 NOTE — Telephone Encounter (Signed)
Message printed and placed on  SN cart for review  

## 2015-06-09 ENCOUNTER — Encounter: Payer: Self-pay | Admitting: Pulmonary Disease

## 2015-06-09 ENCOUNTER — Ambulatory Visit (INDEPENDENT_AMBULATORY_CARE_PROVIDER_SITE_OTHER): Payer: Medicare Other | Admitting: Pulmonary Disease

## 2015-06-09 VITALS — BP 126/80 | HR 77 | Temp 97.3°F | Wt 172.4 lb

## 2015-06-09 DIAGNOSIS — I872 Venous insufficiency (chronic) (peripheral): Secondary | ICD-10-CM

## 2015-06-09 DIAGNOSIS — K5909 Other constipation: Secondary | ICD-10-CM

## 2015-06-09 DIAGNOSIS — M150 Primary generalized (osteo)arthritis: Secondary | ICD-10-CM

## 2015-06-09 DIAGNOSIS — I4510 Unspecified right bundle-branch block: Secondary | ICD-10-CM

## 2015-06-09 DIAGNOSIS — M159 Polyosteoarthritis, unspecified: Secondary | ICD-10-CM

## 2015-06-09 DIAGNOSIS — K219 Gastro-esophageal reflux disease without esophagitis: Secondary | ICD-10-CM

## 2015-06-09 DIAGNOSIS — F419 Anxiety disorder, unspecified: Secondary | ICD-10-CM

## 2015-06-09 DIAGNOSIS — M545 Low back pain, unspecified: Secondary | ICD-10-CM

## 2015-06-09 DIAGNOSIS — R609 Edema, unspecified: Secondary | ICD-10-CM

## 2015-06-09 DIAGNOSIS — R269 Unspecified abnormalities of gait and mobility: Secondary | ICD-10-CM

## 2015-06-09 DIAGNOSIS — K5730 Diverticulosis of large intestine without perforation or abscess without bleeding: Secondary | ICD-10-CM

## 2015-06-09 DIAGNOSIS — I10 Essential (primary) hypertension: Secondary | ICD-10-CM | POA: Diagnosis not present

## 2015-06-09 NOTE — Telephone Encounter (Signed)
Per SN,  -Please order BMET, BNP, UA, CBC with diff, Iron panel, Vit D, and Vit B12. -Please notify daughter of labs  Orders placed and daughte notified  Nothing further is needed.

## 2015-06-09 NOTE — Patient Instructions (Signed)
Today we updated your med list in our EPIC system...    Continue your current medications the same...    Please bring all your medication bottles with you to your next office visit...  Call for any questions...  Let's plan a follow up visit in 2-74mo, sooner if needed for problems.Marland KitchenMarland Kitchen

## 2015-06-11 ENCOUNTER — Encounter: Payer: Self-pay | Admitting: Pulmonary Disease

## 2015-06-11 NOTE — Progress Notes (Signed)
Subjective:    Patient ID: [name], female    DOB: 15-Oct-1923, 81 y.o.   MRN: [id number]  HPI 81 y/o WF here for a follow up visit... she has multiple medical problems as noted below...  Followed for general medical purposes w/ hx chr obstructive asthma, HBP, RBBB, Hypercholesterolemia, borderline DM, DJD, LBP w/ sp stenosis, etc... ~  SEE PREV EPIC NOTES FOR THE OLDER DATA >>    CTAngio Chest 05/2009 showed no evid of PE, biapical pleuroparenchymal scarring otherw clear lungs, no adenopathy/ effusions/ etc...  ~  February 15, 2014:  [place] w/ incr SOB/DOE walking & Qhs, swelling in legs, etc; CXR & Labs were ok; she changed her Hct to Lasix & improved... We reviewed the following medical problems during today's office visit >>     Chr Obstructive Asthma> stable on Advair100 (but only using it prn) & Proventil prn; notes breathing back to baseline & Prn Klonopin helps dyspnea...    HBP> on ASA81, Norvasc5, Lasix20- 1-2/d; BP=136/70, tol meds well; denies CP, palpit, ch in SOB, but notes persist edema in legs; Rec to ch Amlod5 to Losar50, low sodium, elev legs, Lasix40...    CHOL> on diet alone, refuses meds, FLP 3/14 shows TChol 177, TG 50, HDL 60, LDL 107    DM> on diet alone, wt up slightly to  ~179#, BS=95, last A1c (2/13) was 6.3 & she knows to restrict carbs etc...    GI- Reflux, Divertics, constip> on Protonix40, Miralax, Senakot-S; continue same meds..[name]   DJD/ LBP> on Celebrex & osteobiflex prn; had right THR 2011; known sp stenosis w/ prev ESI... We reviewed prob list, meds, xrays and labs> see below for updates >>   CXR 3/15 showed norm heart size, clear lungs, elev of right hemidiaph, NAD...  LABS 3/15:  Chems- wnl;  CBC- wnl;  BNP=26...  ~  Apr 14, 2014:  226moOV & add-on appt requested for "indigestion"> difficult hx but pt appears to be c/o swallowing difficulty, dysphagia, heartburn, & indigestion according to the daugh; she's had gas, belching,  hoarseness, SOB, and early satiety; also notes constipation & took mag citrate for relief;  Epic review indicates hx indigestion & reflux symptoms- treated by DrPerry w/ PPI, she had EGD & Colon 2002;  Notes food sticking in mid-esoph w/ pain, nausea despite her Prevacid30 qam...  We decided to increase the PPI- Prevacid30Bid and refer to DrPerry for EGD and dilatation....    She is also c/o incr ankle edema; on Lasix20, no salt, etc;  We discussed increase diuretic to 4035mm and re-double efforts at sodium restriction, elevation, support hose, etc...     BP remains stable ooff Amlod, on Losar50, Lasix40;  BP= 138/80 today & she denies angina pain, CHF, etc...  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     She also had some ankle edema while on Lasix20 and was told no salt, incr Lasix to 36m/d & elevate/ support hose/ etc... Once again daughter has changed her meds based on perceived side effects ("causing head spells- I feel my heart in my head"); they stopped the Lasix & she does not want to restart- asking to go back on prev meds= Amlod5 & HCT12.5 daily, OK...    Similarly she as Klonopin 0.578mto take 1/2 to 1 tab as needed for dyspnea or anxiety, and she tells me she hasn't even tried it one time!  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  I have again recommended that the pt try to increase her Klonopin from 0.19m41mhs to one Bid or perhaps try 1/2 in AM, 1/2 in afternoon, and 1 at bedtime...     Finally the daughter asked about ordering some Physical Therapy for her mother; they feel she is getting weaker esp in legs, slower, and balance is off; we reviewed the need for exercise and we will request PT consult & their involvement We reviewed prob list, meds, xrays and labs> see below for updates >> OK 2015 flu vaccine today  ~  September 28, 2014:  4mo37mo & [name] says "I'm not doing well"; c/o skin rash- daughter thinks it's the EKG tabs (but she has rash on lowe back/ buttock), states "allergic to elastic", she has seen Derm- DrDJones on 3 diff creams (currently on clobetasol0.05% & Eurcin calming lotion); they did Bx= "looked ok, it was neg";  He is apparently considering an immunosuppressant med 7 want her checked for Tb- we will send Quantiferon Gold...     She saw DrJEdwards for GI> nothing avail in EPIC yet but daughter states he did MBS& he rec warm liquids w/ her meals & she is 90% better decr burbs, decr gas, swallowing better; pt stopped Nexium on her own & refuses restart...    On Advair100, AlbutHFA, MMW> all being used just prn & she states breathing is 90% better...    On Amlod5 &  HCT12.5; BP= 140/70 w/o CP, palpit, SOB, edema...     She is getting outpt PT now- helping she says...    She is off the Klonopin & refuses to restart this med (daugh states she never really took the first pill)... We reviewed prob list, meds, xrays and labs> see below for updates >>  PLAN>> we wrote for Atarax 10mg57m prn itching; we sent Quantiferon gold- NEG...   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, etc;  She has mult somatic complaints- good days & bad, hard to sort out the salient features, I have prev rec a low dose Klonopin Bid to help w/ this & her dyspnea but daugh states she has never taken it, we discussed this again;  CC is leg pain- difficulty walking, uses cane, known spinal stenosis, she's had PT, she's had shots and encouraged to f/u w/ drRamos about this; not on pain meds "I use natural products";  Also c/o ?dyspnea as before, phlegm in throat, "attacks" of swallowing trouble which we have investigated w/ Ba swallow/ UGI, speech path eval, etc & she is rec to take Nexium 40Bid; daughter states she occas has to drive her around in her car w/ window 1/2 open to get relief (I again rec use of the Klonopin); offered GI eval w/ DrPerry or DrJEdwards & they will decide; daugh notes that they have tried everything for the phlegm in throat including Mucinex, Rock&Rye, Rawleigh's salve, Vicks vaporub, [place] lozenges, rolaids, warm honey & lemon, MMW; they wonder about food allergy & they are encouraged to f/u w/ GI;  Finally they mention her rash- sm patch on her back over sacrum & she was tried on several creams per DrJones- intol to some of them- still on Prednisone 44m/d &  she needs to wean this off, they have f/u appt in several days...     There have been mult phone calls in the interim> feeling weak, SOB, edema; daugh states she not taking her Advair, Klonopin, on HCT25-1/2 daily, they didn't want to incr meds but requested Cards eval for "CHF"-ok...    She saw DCherly Hensen12/30/15 but wouldn't allow EKG due to "allergy to the electrodes"; his note is reviewed- he agreed w/ ARB/diuretic but noted pts refusal & she wanted to be back on low dose Amlod; no signs of CHF- they did 2DEcho showing norm LV size & function w/ EF=65-70%, AoV leaflets mildly thickened w/o AS, MV leaflets mod thickened w/ trivMR, mild RA dil, PAsys=317mg; no change in meds...     She went to the ED 11/22/14 w/ a choking episode, SOB, altered mental status> note reviewed- all symptoms resolved spont; they did CXR- NAD; and Blood work- wnl...    Derm eval DrDJones w/ Dx of bullous pemphigoid- on Pred 2016m & they understand that this can contrib to edema; advised 2gm Na diet etc; they do not want stronger diuretic...  We reviewed prob list, meds, xrays and labs> see below for updates >>   CXR 1/16 showed norm heart size, clear lungs, mild right diaph eventration- no change, Tspine DJD. DISH/ osteopenia; NAD...   2DEcho 1/16 showed norm LV size & function w/ EF=65-70%, AoV leaflets mildly thickened w/o AS, MV leaflets mod thickened w/ trivMR, mild RA dil, PAsys=39m39m..  LABS 1/16:  Chems- wnl w/ Cr=0.95;  BNP=66;  CBC- wnl w/ Hg=14.4...  ~  January 26, 2015:  6wk ROV & MargYuleidyurns w/ her daughter ShirPearlean Browniee appears stable and has no new complaints but retains mult minor somatic complaints as usual; the biggest issue is disposition & personal family matters; MargLevy Sjogrentinues to live in her house alone, daughter does a great job of caring for her but it is wearing her out & pt is not always cooperative... They have looked into AL vs  skilled care at several facities but they have not been to her  satisfaction;  We will try for a [facility] home assessment & i told them i would be happy to assist in any way possible...     She has gained 9# & has 1-2+ pitting edema in LEs; daughter states she will only eat [employer] food "I like it" & we reviewed low sodium options; we decided to chenage her HCT25-1/2 tab to [place] per day...    She needs to increase her exercise program    We reviewed prob list, meds, xrays and labs> see below for updates >>  PLAN>> they will continue to look into AL situations; we will request [facility] home health assessment; change HCT to LASIX$RemoveBef'20mg'XGWTihUCFO$  Qam, no salt, elev legs, try support hose...  ~  Mar 22, 2015:  21mo ROV & Vista went to the ER 03/15/15> Notes reviewed- presented w/ SOB, noted incr swelling, not resting well; Exam showed Afeb, VSS, O2sat=94%, Lungs clear no wheezing, Cardiac exam neg, +edema in feet; CXR showed no edema, NAD; Labs- ok x K=3.0 (she is supposed to be on K20/d);  They gave her Lasix40 in the ER and they wondered about anxiety; Daughter indicates that she had been anxious after she pulled the cord off a ceiling fan & could not fix it; she also states that mother is not regular w/ her meds "she takes what she wants to take" but pt says "I take it when I think I need it"; they cannot agree on placement, they declined home health services; she has Klonopin 0.$RemoveBefore'5mg'SnFcOZQpLCDJr$  to take but pt won't take it> daugh says she stays outside on back porch til ~9pm daily ("I breath better outside") then goes in to get ready for bed...     EXAM shows Afeb, VSS, O2sat=93% on RA, wt is down 3#;  Chest- clear w/o w/r/r;  Heart- RR w/o m/r/g;  Ext- neg x trace edema...   CXR 4/16 in ER> norm heart size, atherosclerosis of Ao, no edema or consolidation in lungs, eventration of right hemidiaph- no change, DJD spine.  EKG 4/16 in ER> NSR, rate72, PACs, LAD, RBBB  LABS 4/16 in ER> Chems- wnl x K=3.0;  CBC- wnl;  Troponin=neg;  BNP=69 PLAN>>  I have instructed pt & daugh to increase her KCl  to 18mEq Bid & take it regularly; she is encouraged to try the Klonopin 1/2 tab bid regularly for he anxiety...   ~  June 09, 2015:  39mo ROV & Gaige indicates that she is doing satis, feels well, no new complaints or concerns, but there is stillalot of tension betw her & daughter shirley who does a fabulous job looking after & caring for her mother... They now have a lady who comes in 4PM-9PM to help [place] but she is not happy- "we just sit and eat ice cream"; [name] (tearful) notes no further anxiety/ panic attacks in the eve hours, mother doesn't want to accept help;  Counseling is recommended but they decline...     Chr Obstructive Asthma> stable on Pred10, Advair100Bid (but only using it prn) & Proventil prn; notes breathing back to baseline & prn Klonopin helps dyspnea but she won't use it...    HBP> on ASA81, Norvasc5, Lasix20, K10-2/d; BP=126/80, tol meds well; denies CP, palpit, ch in SOB, tr edema...    CHOL> on diet alone, refuses meds, FLP 3/14 shows TChol 177, TG 50, HDL 60, LDL 107    DM> on diet alone, wt stable at  172#, BS=100-120, last A1c (2/13) was 6.3 & she knows to restrict carbs etc...    GI- Reflux, Divertics, constip> prev on Protonix40, Miralax, Senakot-S; continue same meds.[name]    DJD/ LBP> on Pred10, OTC analgesics prn & osteobiflex prn; had right THR 2011; known sp stenosis w/ prev ESI... We reviewed prob list, meds, xrays and labs>  IMP/PLAN>>  Mother & [name] really need counseling but it's not going to happen; they are both wonderful people- Mother 43y/o and has mild senile dementia, daughter very loving & cares for mother deeply, worries, etc; mother needs med supervision and monitoring but refuses AL etc; and so it goes...           Problem List:       DYSPNEA (ICD-786.05) - long hx of chronic obstructive asthma treated w/ ADVAIR100Bid & PROAIR (she uses them Prn now)... she is a non-smoker w/ some reactive airways disease in the past & retired from [employer] after  33 years in 1991... she denies cough, sputum, hemoptysis, worsening dyspnea, wheezing, chest pains, snoring, daytime hypersomnolence, etc...  ~  baseline CXR w/o acute changes...  ~  PFT's 5/02 w/ FVC 2.07 (68%), FEV1=1.36 (57%), and FEV1/FVC ratio=66%, mid-flows 42%... ~  CT Angio 7/10 was neg- x biapical pleuroparenchymal scarring... ~  CXR 10/11 showed sl elev right hemidaiph, mild DJD sp, osteopenia, NAD.[name]. ~  CXR 6/12 showed mild apical scarring, clear & NAD, DJD sp w/ osteophytes... ~  Intermittent dyspnea more related to anxiety & treated w/ KLONOPIN 0.$RemoveBefor'5mg'TCZzxwCOebVA$  1/2 to 1 tab Bid==> improved. ~  CXR 4/13 showed normal heart size, clear lungs, DJD in TSpine... ~  CXR 2/14 showed normal heart size, clear lungs w/ sl peribronch thickening, DJD in spine, NAD.[name]. ~  CXR 3/15 showed norm heart size, clear lungs, elev of right hemidiaph, NAD... ~  She is encouraged to take the Klonopin 0.$RemoveBefore'5mg'DKJMEJXCwSVIF$  Bid regularly- consider taking 1/2 in AM, 1/2 in afternoon, one at bedtime... ~  1/16: she continues w/ mult somatic complaints and intermittent choking episodes etc; she refuses to take the Advair or Klonopin... ~  CXR 1/16 showed norm heart size, clear lungs, mild right diaph eventration- no change, Tspine DJD. DISH/ osteopenia; NAD... ~  3/16: she is stable on current meds, asked to incr her non-existent exercise program... ~  4/16: went to ER w/ SOB- nothing found x mild edema; given Lasix40, potassium was low, thought to be anxious; in office f/u her daugh confirms anxiety & pt not taking her meds regularly; asked to take meds every day & try the Klonopin...  HYPERTENSION (ICD-401.9) - controlled on NORVASC $RemoveBe'5mg'snyxGSFJI$  daily & HCTZ $Remo'25mg'hWoUd$ tab daily...  ~  2/13:  BP 144/70 today> tol rx well & denies HA, visual changes, CP, palipit, dizziness, syncope, edema, etc... ~  4/13:  BP= 148/80 & she denies CP, palpit, edema; dyspnea improved w/ Klonopin. ~  6/13:  BP= 132/68 & as noted she has mult somatic complaints... ~  3/14:   on Norvasc10, HCTZ25; BP=136/60, tol meds well; denies CP, palpit, ch in SOB, edema, etc  ~  9/14:  BP controlled on Amlod5 & Hct25-1/2 daily; BP= 130/70 & she denies CP, palpit, dizzy, SOB, edema, etc. ~  3/15: on ASA81, Norvasc5, Lasix20- 1-2/d; BP=136/70, tol meds well; denies CP, palpit, ch in SOB, but notes persist edema in legs; Rec to ch Amlod5 to Losar50, low sodium, elev legs, Lasix40. ~  5/15: on Losar50, Lasix40;  BP= 138/60 & she denies angina pain, palpit, ch  in SOB, etc... ~  7/15: on Losar50, she stopped Lasix; BP= 138/78 and they want to go back on Amlod5 + HCT12.5 daily... ~  9/15: on Amlod5, Hct12.5; BP= 128/80 & she likes this combo better- denies CP, palpit, etc... ~  1/16: on Amlod5, Hct12.5 but ?what she is taking; BP= 130/80 & she has refused the ARBs... ~  3/16: on Amlod5, Hct12.5; BP= 142/80 & she has gained 9# w/ 1-2+ edema; discussed low sodium & change Hct to LASIX20 Qam... ~  5/16: on Amlod5, Lasix20, K20; BP=148/70, recent K=3.0 & daugh notes she's not taking meds regularly; asked to take meds everyday & incr K20Bid...  RIGHT BUNDLE BRANCH BLOCK (ICD-426.4) - on ASA $Remo'81mg'ffDFd$ /d... baseline EKG w/ RBBB and 2DEcho 5/02 showed mild asymmetric LVH w/ incr EF... ~  12/15: she had Cards eval by DrNishan> he rec ARB/diuretic but she refused to switch from her Amlod5/ Hct12.5 regimen; she refused EKG due to "allergy to the electrodes"... ~  2DEcho 1/16 showed norm LV size & function w/ EF=65-70%, AoV leaflets mildly thickened w/o AS, MV leaflets mod thickened w/ trivMR, mild RA dil, PAsys=32mmHg... ~  EKG 4/16 showed NSR, rate72, PACs, LAD, RBBB  VENOUS INSUFFIC & EDEMA >>  ~  3/15: she was switched off Amlod & onto Losar50, Lasix20=>40, plus no salt, elevation, support hose, etc...  ~  9/15: they preferred the [facility] & HCT12.5 regimen; she has VI & 1+edema but stable, no acute changes... 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 ~   3/16: she remains on the Pred from Derm, now w/ incr edema & 9# wt gain, rec no salt & change Hct to LASIX20/d... ~  5/16: improved on Lasix20 w/ edema decr 7 wt down several lbs...  HYPERLIPIDEMIA (ICD-272.4) - on diet alone... she forgets to come to visits FASTING for this blood work ~  [place] 8/07 showed TChol 205, TG 71, HDL 49, LDL 130... ~  FLP 6/12 on diet alone showed TChol 218, TG 50, HDL 66, LDL 129 ~  FLP 2/13 on diet alone showed TChol 171, TG 43, HDL 66, LDL 97 ~  FLP 3/14 on diet alone showed TChol 177, TG 50, HDL 60, LDL 107 ~  She needs to ret FASTING for f/u FLP...  DIABETES MELLITUS, BORDERLINE (ICD-790.29) - on diet alone w/ prev BS's in the 100-160 range... ~  labs in 2008-9 showed BS= 101 to 108 ~  labs 1/10 showed BS= 106, A1c= 5.9 ~  Labs 6/12 showed BS= 93, A1c= 6.6.Marland KitchenMarland Kitchen rec diet, exercise... ~  Labs 2/13 showed BS= 92, A1c= 6.3 ~  3/14: on diet alone, wt stable ~174#, BS=97, last A1c (2/13) was 6.3 & she knows to restrict carbs etc. ~  Labs 3/15 showed BS= 95; and BS= 120 in [id number]...  ~  Labs 2016 showed BS= 101-135  INDIGESTION/ REFLUX SYMPTOMS >> see 10/12 note & PROTONIX $RemoveBe'40mg'PKhoBdlNJ$ /d started, further eval if symptoms persist... ESOPHAGEAL DYSMOTILITY/ PRESBYESOPHAGUS >>  ~  4/13:  She notes some reflux symptoms and excess gas w/ belching; rec to take the Protonix daily & Simethacone vs Tums which she says helps her gas. ~  5/13:  She saw GI DrPerry w/ rec to take Prilosec for her indigestion... ~  5/15:  She presented w/ worsening indigestion, dysphagia, reflux & Prev30 was incr to Bid w/ GI f/u suggested for EGD... 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 ~  9/15:   Symptoms persist but she is not regurg or vomiting, and weight stable; they want to change to Nexium40Bid-OK, and rec proceed w/ MBS by speech path... ~  She continues to have intermit choking episodes and c/o phlegm in her throat; she has tried "everything" & encouraged to f/u w/ GI- DrPerry/ DrJEdwards...  DIVERTICULOSIS OF COLON (ICD-562.10) - she takes SENAKOT-S, MIRALAX, Peppermint Tea, & sauerkraut Prn...last colonoscopy 9/02 by DrPerry was WNL...  PYELONEPHRITIS (ICD-590.80) - SEE 1/09 Hospitalization (reviewed)... ~  She saw DrMacDiarmid for her recurrent UTIs, chronic cystitis, urge & stress incont, nocturia; she is INTOL to [place]...  Hx of BREAST CYST (ICD-610.0)  DEGENERATIVE JOINT DISEASE (ICD-715.90) - s/p right hip hemiarthroplasty 11/09 by DrAplington w/ wound complic... then dx w/ loosening of the femoral shaft & had conversion to right THR by DrAlusio 10/11 & much improved... she uses CELEBREX $RemoveBefor'200mg'XzgTUFWUMJfx$  Prn (seldom takes this).  LOW BACK PAIN SYNDROME (ICD-724.2) & SPINAL STENOSIS (ICD-724.00) - severe LBP & spinal stenosis w/ evals by DrRamos & DrNudelman... s/p shots, considering poss surgery vs alternative therapies... she takes Celebrex, Osteobiflex, MVI, Vit D... ~  8/10: eval by DrAplington- diff leg lengths, lift placed in right shoe, then trial Lyrica$RemoveBeforeDE'50mg'YQktWQgQZKbfuhN$ ... ~  12/11:  improved after hip revision surg (to THR) 10/11 w/ better ambulaton... ~  5/13:  DrRamos gave her another ESI for her leg pain related to sp stenosis... ~  3/14:  C/o neuropathic discomfort in legs- eval by Ramos & offered shots in her back; try Lyrica50 in the interim... ~  Persistent leg pain, on OTC "natural" meds she says; encouraged to f/u w/ DrRamos et al...  Hx of ANEMIA (ICD-285.9) - eval by GI in 2002 showed normal EGD and Colon... prob iron malabsorption problem Rx'd w/ Fe infusion... ~  labs 1/10 showed Hg= 14.6, MCV= 89, Fe= 94 ~  labs 12/11 showed Hg= 12.8, MCV= 90, Fe= 33... try Fe  supplement + VitC... ~  Labs 6/12 showed Hg= 15.0 ~  Labs 2/13 showed Hg= 14.6 ~  Labs 2/14 showed Hg= 14.8 ~  Labs 3/15 showed Hg= 14.9 ~  Labs 1/16 showed Hg= 14.4 ~  Labs 4/16 showed Hg= 13.7  DERM:  rash Rx'd by dermatology- OLUX-E foam= clobetasol Foam 0.05%... 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 ~  5/16: she is still on Pred per Derm- currently $RemoveBeforeD'10mg'WFkiPLAwtVgDfo$ /d...    Past Surgical History  Procedure Laterality Date  . Cataract extraction    . Right hip hemiarthroplasty      total  . Conversion to right thr      Outpatient Encounter Prescriptions as of 06/09/2015  Medication Sig  . ADVAIR DISKUS 100-50 MCG/DOSE AEPB Inhale 1 puff into the lungs daily.   [name] albuterol (PROVENTIL HFA;VENTOLIN HFA) 108 (90 BASE) MCG/ACT inhaler Inhale 2 puffs into the lungs every 6 (six) hours as needed for wheezing or shortness of breath.  . Alum & Mag Hydroxide-Simeth (MAGIC MOUTHWASH W/LIDOCAINE) SOLN Take 5 mLs by mouth 4 (four) times daily as needed for mouth pain.  [name] aspirin 81 MG tablet Take 81 mg  by mouth daily.   . ferrous sulfate 325 (65 FE) MG tablet Take 325 mg by mouth daily with breakfast.  . furosemide (LASIX) 20 MG tablet Take 1 tablet (20 mg total) by mouth daily.  . Multiple Vitamin (MULTIVITAMIN WITH MINERALS) TABS tablet Take 1 tablet by mouth daily. Centrum Silver  . NORVASC 5 MG tablet TAKE 1 TABLET ONCE DAILY.  [name] polyethylene glycol (MIRALAX / GLYCOLAX) packet Take 17 g by mouth daily as needed for mild constipation.   . potassium chloride (K-DUR,KLOR-CON) 10 MEQ tablet Take 2 tablets (20 mEq total) by mouth daily.  . predniSONE (DELTASONE) 20 MG tablet Take 10 mg by mouth daily with breakfast. Changed 3.21.16  . ranitidine (ZANTAC) 150 MG tablet Take 150 mg by mouth daily.  . clonazePAM (KLONOPIN) 0.5 MG tablet Take 0.5 tablets (0.25 mg total) by mouth 2 (two) times daily as needed for anxiety. (Patient not taking:  Reported on 06/09/2015)  . Menthol, Topical Analgesic, (BIOFREEZE) 4 % GEL Apply 1 application topically daily as needed (pain).   [name] NEXIUM 40 MG capsule Take 40 mg by mouth 2 (two) times daily before a meal.    No facility-administered encounter medications on file as of 06/09/2015.    Allergies  Allergen Reactions  . Azithromycin Shortness Of Breath    Trouble breathing  . Ciprofloxacin Other (See Comments)    REACTION: hallucinations  . Levofloxacin Other (See Comments)    Insomnia, indigestion, tingling sensation in legs  . Latex Rash  . Other Rash    EKG leads caused a rash that required steroids to clear    Current Medications, Allergies, Past Medical History, Past Surgical History, Family History, and Social History were reviewed in [employer] record.    Review of Systems         See HPI - all other systems neg except as noted... The patient complains of decreased hearing, dyspnea on exertion, muscle weakness, and difficulty walking.  The patient denies anorexia, fever, weight loss, weight gain, vision loss, hoarseness, chest pain, syncope, peripheral edema, prolonged cough, headaches, hemoptysis, abdominal pain, melena, hematochezia, severe indigestion/heartburn, hematuria, incontinence, suspicious skin lesions, transient blindness, depression, unusual weight change, abnormal bleeding, enlarged lymph nodes, and angioedema.     Objective:   Physical Exam     WD, WN, Chr ill appearing 81 y/o WF in NAD... GENERAL:  Alert & oriented; pleasant & cooperative... HEENT:  [place]/AT, EOM-full, EACs-clear, TMs-wnl, NOSE-clear, THROAT-clear & wnl. NECK:  Supple w/ fairROM; no JVD; normal carotid impulses w/o bruits; no thyromegaly or nodules palpated; no lymphadenopathy. CHEST:  Clear to P & A; without wheezes/ rales/ or rhonchi heard... HEART:  Regular Rhythm; without murmurs/ rubs/ or gallops detected... ABDOMEN:  Soft & nontender; normal bowel sounds; no  organomegaly or masses palpated... EXT:  mod arthritic changes, walks w/ cane, +venous insuffic & incr 1-2+ edema., scattered varicose veins... NEURO:  CN's intact; motor testing normal; no focal deficits... DERM:   mild intertrig rash under breast, & onychomycosis of toenails...  RADIOLOGY DATA:  Reviewed in the EPIC EMR & discussed w/ the patient...  LABORATORY DATA:  Reviewed in the EPIC EMR & discussed w/ the patient...   Assessment & Plan:    Hx episodes of choking & SOB>> see above, she has been resistent to trying any of the meds & treatment suggestions that we have given to her (I know she would improve on Benzo rx)...   DYSPNEA>  Hx asthma, stable off Advair,  on Proair prn; hx anxiety component on Klonopin but she is not using!  HBP>  Controlled on Amlod5 + Lasix20, K20/d; but she is not taking meds regularly & labs 4/16 w/ K=3.0; therefore incr K20Bid...  RBBB>  Aware & denies CP, palpit, ch in DOE, etc... she thinks that she is allergic to EKG electrodes; EKG 4./16 in ER showed NSR, rate72, PACs, LAD, RBBB  CHOL>  On diet alone & FLP looks reasonable;  We reviewed low chol, low fat diet...  DM>  BS= 101-135 & last A1c is 6.3;  on diet alone & we reviewed low carb no sweets etc...  GI> Indigestion, Divertics> she notes most bowel symptoms resolved off spicey foods;  UGI symptoms w/ dysphagia, food sticking, belch/gas/ etc have not resolved on Protonix40Bid and after GI consult w/ DrPerry; we reviewed her UGI series results and decided to proceed w/ MBS by Speech Path; transiently improved then sought 2nd opinion from DrJEdwards=> on Nexium40Bid...  UTI>  Klebsiella UTI resolved after Septra Rx... She has been eval by DrMacDiarmid.  DJD, LBP, Spinal Stenosis>  Prev evals by Ortho, DrRamos, DrNudelman etc; improved after THR w/ better ambulation; c/o neuropathic discomfort in legs- she will f/u w/ Ramos for shots, using OTC "natural" meds...  Anxiety>  If she would take the  Klonopin, I feel it would help...   Patient's Medications  New Prescriptions   No medications on file  Previous Medications   ADVAIR DISKUS 100-50 MCG/DOSE AEPB    Inhale 1 puff into the lungs daily.    ALBUTEROL (PROVENTIL HFA;VENTOLIN HFA) 108 (90 BASE) MCG/ACT INHALER    Inhale 2 puffs into the lungs every 6 (six) hours as needed for wheezing or shortness of breath.   ALUM & MAG HYDROXIDE-SIMETH (MAGIC MOUTHWASH W/LIDOCAINE) SOLN    Take 5 mLs by mouth 4 (four) times daily as needed for mouth pain.   ASPIRIN 81 MG TABLET    Take 81 mg by mouth daily.    CLONAZEPAM (KLONOPIN) 0.5 MG TABLET    Take 0.5 tablets (0.25 mg total) by mouth 2 (two) times daily as needed for anxiety.   FERROUS SULFATE 325 (65 FE) MG TABLET    Take 325 mg by mouth daily with breakfast.   FUROSEMIDE (LASIX) 20 MG TABLET    Take 1 tablet (20 mg total) by mouth daily.   MENTHOL, TOPICAL ANALGESIC, (BIOFREEZE) 4 % GEL    Apply 1 application topically daily as needed (pain).    MULTIPLE VITAMIN (MULTIVITAMIN WITH MINERALS) TABS TABLET    Take 1 tablet by mouth daily. Centrum Silver   NEXIUM 40 MG CAPSULE    Take 40 mg by mouth 2 (two) times daily before a meal.    NORVASC 5 MG TABLET    TAKE 1 TABLET ONCE DAILY.   POLYETHYLENE GLYCOL (MIRALAX / GLYCOLAX) PACKET    Take 17 g by mouth daily as needed for mild constipation.    POTASSIUM CHLORIDE (K-DUR,KLOR-CON) 10 MEQ TABLET    Take 2 tablets (20 mEq total) by mouth daily.   PREDNISONE (DELTASONE) 20 MG TABLET    Take 10 mg by mouth daily with breakfast. Changed 3.21.16   RANITIDINE (ZANTAC) 150 MG TABLET    Take 150 mg by mouth daily.  Modified Medications   No medications on file  Discontinued Medications   No medications on file

## 2015-06-12 ENCOUNTER — Emergency Department (HOSPITAL_COMMUNITY): Payer: Medicare Other

## 2015-06-12 ENCOUNTER — Encounter (HOSPITAL_COMMUNITY): Payer: Self-pay | Admitting: *Deleted

## 2015-06-12 ENCOUNTER — Emergency Department (HOSPITAL_COMMUNITY)
Admission: EM | Admit: 2015-06-12 | Discharge: 2015-06-12 | Disposition: A | Discharge status: Home / Self Care | Payer: Medicare Other | Attending: Emergency Medicine | Admitting: Emergency Medicine

## 2015-06-12 ENCOUNTER — Other Ambulatory Visit: Payer: Self-pay | Admitting: Pulmonary Disease

## 2015-06-12 DIAGNOSIS — Z8742 Personal history of other diseases of the female genital tract: Secondary | ICD-10-CM | POA: Diagnosis not present

## 2015-06-12 DIAGNOSIS — R0600 Dyspnea, unspecified: Secondary | ICD-10-CM | POA: Diagnosis not present

## 2015-06-12 DIAGNOSIS — Z7951 Long term (current) use of inhaled steroids: Secondary | ICD-10-CM | POA: Diagnosis not present

## 2015-06-12 DIAGNOSIS — Z7982 Long term (current) use of aspirin: Secondary | ICD-10-CM | POA: Diagnosis not present

## 2015-06-12 DIAGNOSIS — K219 Gastro-esophageal reflux disease without esophagitis: Secondary | ICD-10-CM | POA: Diagnosis not present

## 2015-06-12 DIAGNOSIS — I10 Essential (primary) hypertension: Secondary | ICD-10-CM | POA: Insufficient documentation

## 2015-06-12 DIAGNOSIS — Z79899 Other long term (current) drug therapy: Secondary | ICD-10-CM | POA: Diagnosis not present

## 2015-06-12 DIAGNOSIS — D649 Anemia, unspecified: Secondary | ICD-10-CM | POA: Insufficient documentation

## 2015-06-12 DIAGNOSIS — R0602 Shortness of breath: Secondary | ICD-10-CM | POA: Diagnosis present

## 2015-06-12 DIAGNOSIS — Z87440 Personal history of urinary (tract) infections: Secondary | ICD-10-CM | POA: Insufficient documentation

## 2015-06-12 DIAGNOSIS — Z8639 Personal history of other endocrine, nutritional and metabolic disease: Secondary | ICD-10-CM | POA: Insufficient documentation

## 2015-06-12 DIAGNOSIS — Z91040 Latex allergy status: Secondary | ICD-10-CM | POA: Diagnosis not present

## 2015-06-12 DIAGNOSIS — M1990 Unspecified osteoarthritis, unspecified site: Secondary | ICD-10-CM | POA: Diagnosis not present

## 2015-06-12 DIAGNOSIS — Z87448 Personal history of other diseases of urinary system: Secondary | ICD-10-CM | POA: Diagnosis not present

## 2015-06-12 LAB — URINALYSIS, ROUTINE W REFLEX MICROSCOPIC
BILIRUBIN URINE: NEGATIVE
GLUCOSE, UA: NEGATIVE mg/dL
HGB URINE DIPSTICK: NEGATIVE
KETONES UR: NEGATIVE mg/dL
Leukocytes, UA: NEGATIVE
Nitrite: NEGATIVE
PROTEIN: NEGATIVE mg/dL
Specific Gravity, Urine: 1.008 (ref 1.005–1.030)
UROBILINOGEN UA: 0.2 mg/dL (ref 0.0–1.0)
pH: 5 (ref 5.0–8.0)

## 2015-06-12 LAB — CBC WITH DIFFERENTIAL/PLATELET
Basophils Absolute: 0 10*3/uL (ref 0.0–0.1)
Basophils Relative: 0 % (ref 0–1)
EOS ABS: 0.1 10*3/uL (ref 0.0–0.7)
Eosinophils Relative: 1 % (ref 0–5)
HCT: 39.3 % (ref 36.0–46.0)
HEMOGLOBIN: 13.2 g/dL (ref 12.0–15.0)
Lymphocytes Relative: 12 % (ref 12–46)
Lymphs Abs: 0.8 10*3/uL (ref 0.7–4.0)
MCH: 30.5 pg (ref 26.0–34.0)
MCHC: 33.6 g/dL (ref 30.0–36.0)
MCV: 90.8 fL (ref 78.0–100.0)
Monocytes Absolute: 0.7 10*3/uL (ref 0.1–1.0)
Monocytes Relative: 10 % (ref 3–12)
NEUTROS ABS: 5.4 10*3/uL (ref 1.7–7.7)
Neutrophils Relative %: 77 % (ref 43–77)
PLATELETS: 152 10*3/uL (ref 150–400)
RBC: 4.33 MIL/uL (ref 3.87–5.11)
RDW: 13.1 % (ref 11.5–15.5)
WBC: 7.1 10*3/uL (ref 4.0–10.5)

## 2015-06-12 LAB — BASIC METABOLIC PANEL
Anion gap: 6 (ref 5–15)
BUN: 13 mg/dL (ref 6–20)
CALCIUM: 9.3 mg/dL (ref 8.9–10.3)
CO2: 28 mmol/L (ref 22–32)
Chloride: 102 mmol/L (ref 101–111)
Creatinine, Ser: 1.07 mg/dL — ABNORMAL HIGH (ref 0.44–1.00)
GFR calc Af Amer: 51 mL/min — ABNORMAL LOW (ref >60)
GFR, EST NON AFRICAN AMERICAN: 44 mL/min — AB (ref >60)
GLUCOSE: 147 mg/dL — AB (ref 65–99)
Potassium: 3.5 mmol/L (ref 3.5–5.1)
Sodium: 136 mmol/L (ref 135–145)

## 2015-06-12 LAB — BRAIN NATRIURETIC PEPTIDE: B NATRIURETIC PEPTIDE 5: 55.2 pg/mL (ref 0.0–100.0)

## 2015-06-12 LAB — TROPONIN I: Troponin I: <0.03 ng/mL (ref <0.031)

## 2015-06-12 MED ORDER — AEROCHAMBER PLUS W/MASK MISC: Status: AC

## 2015-06-12 MED ORDER — IPRATROPIUM-ALBUTEROL 0.5-2.5 (3) MG/3ML IN SOLN
3.0000 mL | Freq: Once | RESPIRATORY_TRACT | Status: AC
  Administered 2015-06-12: 3 mL via RESPIRATORY_TRACT
  Filled 2015-06-12: qty 3

## 2015-06-12 MED ORDER — LORAZEPAM 0.5 MG PO TABS: 0.2500 mg | ORAL_TABLET | Freq: Three times a day (TID) | ORAL | Status: DC | PRN

## 2015-06-12 NOTE — ED Notes (Signed)
Pt ambulating independently w/ steady gait w/ use of a cane on d/c in no acute distress, A&Ox4. D/c instructions reviewed w/ pt and family - pt and family deny any further questions or concerns at present. Rx given x2

## 2015-06-12 NOTE — Discharge Instructions (Signed)

## 2015-06-12 NOTE — ED Provider Notes (Signed)
CSN: [id number]     Arrival date & time 06/12/15  1536 History   First MD Initiated Contact with Patient 06/12/15 1539     Chief Complaint  Patient presents with  . Shortness of Breath     (Consider location/radiation/quality/duration/timing/severity/associated sxs/prior Treatment) HPI Patient reports an episode of shortness of breath. These come and go without particular associated symptoms. No associated chest pain. No associated syncope. No recent fever or cough. Sometimes they are alleviated by an inhaler. This is a long-standing intermittent problem. Patient reports this has happened on prior occasions. This is not made worse by exertion or lying flat. Past Medical History  Diagnosis Date  . Shortness of breath   . Unspecified essential hypertension   . Right bundle branch block   . Other and unspecified hyperlipidemia   . Other abnormal glucose   . Diverticulosis of colon (without mention of hemorrhage)   . Pyelonephritis, unspecified   . Solitary cyst of breast   . Osteoarthrosis, unspecified whether generalized or localized, unspecified site   . Lumbago   . Spinal stenosis, unspecified region other than cervical   . Anemia, unspecified   . GERD (gastroesophageal reflux disease)   . Pyelonephritis   . DJD (degenerative joint disease)   . UTI (lower urinary tract infection)    Past Surgical History  Procedure Laterality Date  . Cataract extraction    . Right hip hemiarthroplasty      total  . Conversion to right thr     Family History  Problem Relation Age of Onset  . Cancer Brother   . Cancer Brother    History  Substance Use Topics  . Smoking status: Never Smoker   . Smokeless tobacco: Never Used  . Alcohol Use: No   OB History    No data available     Review of Systems  10 Systems reviewed and are negative for acute change except as noted in the HPI.   Allergies  Azithromycin; Ciprofloxacin; Levofloxacin; Latex; and Other  Home Medications    Prior to Admission medications   Medication Sig Start Date End Date Taking? Authorizing Provider  Alum & Mag Hydroxide-Simeth (MAGIC MOUTHWASH W/LIDOCAINE) SOLN Take 5 mLs by mouth 4 (four) times daily as needed for mouth pain. Patient taking differently: Take 5 mLs by mouth daily as needed for mouth pain. Swish and swallow 01/06/15  Yes Michele Mcalpine, MD  aspirin EC 81 MG tablet Take 81 mg by mouth daily.   Yes Historical Provider, MD  BENZOCAINE, DENTAL, (ANBESOL MAXIMUM STRENGTH) 20 % LIQD Place 1 application onto teeth daily as needed (gum irritation).   Yes Historical Provider, MD  ferrous sulfate 325 (65 FE) MG tablet Take 325 mg by mouth daily with breakfast.   Yes Historical Provider, MD  Fluticasone-Salmeterol (ADVAIR) 100-50 MCG/DOSE AEPB Inhale 1-2 puffs into the lungs daily.   Yes Historical Provider, MD  furosemide (LASIX) 20 MG tablet Take 1 tablet (20 mg total) by mouth daily. Patient taking differently: Take 20 mg by mouth See admin instructions. Take 1 tablet (20 mg) by mouth every morning, may take an additional tablet if needed for ankle swelling 01/26/15  Yes Michele Mcalpine, MD  Multiple Vitamin (MULTIVITAMIN WITH MINERALS) TABS tablet Take 1 tablet by mouth daily. Centrum Silver   Yes Historical Provider, MD  NORVASC 5 MG tablet TAKE 1 TABLET ONCE DAILY. 12/24/14  Yes Michele Mcalpine, MD  OVER THE COUNTER MEDICATION Take 1 application by mouth daily as  needed (sinus congestion). Rawleigh's Salve - apply to nostrils   Yes Historical Provider, MD  polyethylene glycol (MIRALAX / GLYCOLAX) packet Take 17 g by mouth daily as needed for mild constipation.    Yes Historical Provider, MD  potassium chloride (K-DUR,KLOR-CON) 10 MEQ tablet Take 2 tablets (20 mEq total) by mouth daily. Patient taking differently: Take 10 mEq by mouth See admin instructions. Take 1 tablet (10 meq) with prednisone dose 03/22/15  Yes Michele Mcalpine, MD  predniSONE (DELTASONE) 10 MG tablet Take 5 mg by mouth See admin  instructions. Take 1/2 tablet (5 mg) daily for 3 days for itching; hold for 10 days, then restart if needed for itching 05/21/15  Yes Historical Provider, MD  ranitidine (ZANTAC) 150 MG tablet Take 150 mg by mouth daily.   Yes Historical Provider, MD  ADVAIR DISKUS 100-50 MCG/DOSE AEPB INHALE 1 PUFF INTO THE LUNGS EVERY 12 HOURS AS NEEDED. 06/13/15   Michele Mcalpine, MD  clonazePAM (KLONOPIN) 0.5 MG tablet Take 0.5 tablets (0.25 mg total) by mouth 2 (two) times daily as needed for anxiety. Patient not taking: Reported on 06/09/2015 03/22/15   Michele Mcalpine, MD  LORazepam (ATIVAN) 0.5 MG tablet Take 0.5 tablets (0.25 mg total) by mouth every 8 (eight) hours as needed for anxiety. 06/12/15   Arby Barrette, MD  PROAIR HFA 108 [phone] BASE) MCG/ACT inhaler INHALE 2 PUFFS EVERY 4 HOURS AS NEEDED 06/13/15   Michele Mcalpine, MD  Spacer/Aero-Holding Chambers (AEROCHAMBER PLUS WITH MASK) inhaler Use as instructed 06/12/15   Arby Barrette, MD   BP 92/47 mmHg  Pulse 83  Temp(Src) 98 F (36.7 C) (Oral)  Resp 21  Ht 6' (1.829 m)  Wt 172 lb (78.019 kg)  BMI 23.32 kg/m2  SpO2 96% Physical Exam  Constitutional: She is oriented to person, place, and time. She appears well-developed and well-nourished.  HENT:  Head: Normocephalic and atraumatic.  Eyes: EOM are normal. Pupils are equal, round, and reactive to light.  Neck: Neck supple.  Cardiovascular: Normal rate, regular rhythm, normal heart sounds and intact distal pulses.   Pulmonary/Chest: Effort normal and breath sounds normal.  Abdominal: Soft. Bowel sounds are normal. She exhibits no distension. There is no tenderness.  Musculoskeletal: Normal range of motion. She exhibits no edema.  Neurological: She is alert and oriented to person, place, and time. She has normal strength. Coordination normal. GCS eye subscore is 4. GCS verbal subscore is 5. GCS motor subscore is 6.  Skin: Skin is warm, dry and intact.  Psychiatric: She has a normal mood and affect.    ED  Course  Procedures (including critical care time) Labs Review Labs Reviewed  BASIC METABOLIC PANEL - Abnormal; Notable for the following:    Glucose, Bld 147 (*)    Creatinine, Ser 1.07 (*)    GFR calc non Af Amer 44 (*)    GFR calc Af Amer 51 (*)    All other components within normal limits  BRAIN NATRIURETIC PEPTIDE  TROPONIN I  CBC WITH DIFFERENTIAL/PLATELET  URINALYSIS, ROUTINE W REFLEX MICROSCOPIC (NOT AT [facility])    Imaging Review Dg Chest 2 View  06/12/2015   CLINICAL DATA:  Chronic dyspnea  EXAM: CHEST  2 VIEW  COMPARISON:  March 15, 2015  FINDINGS: Mild eventration of the right hemidiaphragm remains stable. There is no appreciable edema or consolidation. The heart size and pulmonary vascularity are normal. No adenopathy. There is degenerative change in the thoracic spine. Atherosclerotic changes again noted  in the aorta.  IMPRESSION: No edema or consolidation. Stable eventration right hemidiaphragm. No change in cardiac silhouette.   Electronically Signed   By: Bretta Bang III M.D.   On: 06/12/2015 17:11     EKG Interpretation None      MDM   Final diagnoses:  Dyspnea   Review of EMR indicates this has been a chronic and ongoing problem. The patient was seen by her pulmonologist within the last 4 days. She has had extensive workup including echo and has no history of congestive heart failure. Troponin, BNP and chest x-ray are stable and within normal limits today. Clinically the patient has well appearance and has no vital sign instability. At this time I do feel she is safe for continued outpatient management. She is prescribed a spacer to assist with use of her albuterol to maximize efficacy. The patient is counseled to continue to work with her pulmonologist and family doctor regarding these episodic symptoms. Her daughter did wish to try a very small dose of benzodiazepine as has been recommended by her pulmonologist in the past. She reports the patient has been very  resistant to taking any specific medications or treatments for these episodes. She uses her inhalers sporadically and to date it has refused to try the prescribed Klonopin. Of note the patient's daughter reports that she has gotten a sitter to stay with the patient in the evenings when she is unavailable to be with her. She notes that although her mother does not like having someone in the home, these episodes have not occurred when someone is home with her.    Arby Barrette, MD 06/14/15 [phone]

## 2015-06-12 NOTE — ED Notes (Signed)
MD at bedside. 

## 2015-06-12 NOTE — ED Notes (Signed)
Per EMS: pt coming from home with c/o sudden onset of shortness of breath. Pt used inhaler with some relief, pt was riding in car with her daughter had another episode of shortness of breath, daughter pulled over to [place] station. Pt was given 2.5 albuterol. Pt reports a history of the same. Pt A&Ox4, respirations equal and unlabored, skin warm and dry

## 2015-07-05 ENCOUNTER — Telehealth: Payer: Self-pay | Admitting: Pulmonary Disease

## 2015-07-05 NOTE — Telephone Encounter (Signed)
Per SN: Yes can try xanax 0.5-- 1 po TID PRN #90 Can try 1/2 tab if too strong  Called and lmtcb

## 2015-07-05 NOTE — Telephone Encounter (Signed)
I called spoke with Talbert Forest. She reports pt was given ativan 06/13/15 to help with pt anxiety. It is not helping. Pt has tried klonopin in the past and did nothing for pt. They are wanting to know if they can try xanax? Aware SN not in until next week and they were fine with waiting for when he returns since they feel more comfortable with him since he knows patient well. Please advise SN thanks

## 2015-07-06 MED ORDER — ALPRAZOLAM 0.5 MG PO TABS: 0.5000 mg | ORAL_TABLET | Freq: Three times a day (TID) | ORAL | Status: DC | PRN

## 2015-07-06 NOTE — Telephone Encounter (Signed)
Called and spoke to pt's daughter. Informed her of the xanax rx. Rx called into preferred pharmacy. Pt's daughter verbalized understanding and denied any further questions or concerns at this time.

## 2015-07-15 ENCOUNTER — Emergency Department (HOSPITAL_COMMUNITY)
Admission: EM | Admit: 2015-07-15 | Discharge: 2015-07-15 | Disposition: A | Discharge status: Home / Self Care | Payer: Medicare Other | Attending: Emergency Medicine | Admitting: Emergency Medicine

## 2015-07-15 ENCOUNTER — Encounter (HOSPITAL_COMMUNITY): Payer: Self-pay | Admitting: *Deleted

## 2015-07-15 ENCOUNTER — Emergency Department (HOSPITAL_COMMUNITY): Payer: Medicare Other

## 2015-07-15 DIAGNOSIS — R0600 Dyspnea, unspecified: Secondary | ICD-10-CM

## 2015-07-15 DIAGNOSIS — I10 Essential (primary) hypertension: Secondary | ICD-10-CM | POA: Insufficient documentation

## 2015-07-15 DIAGNOSIS — R0602 Shortness of breath: Secondary | ICD-10-CM | POA: Diagnosis present

## 2015-07-15 DIAGNOSIS — M1990 Unspecified osteoarthritis, unspecified site: Secondary | ICD-10-CM | POA: Diagnosis not present

## 2015-07-15 DIAGNOSIS — Z7982 Long term (current) use of aspirin: Secondary | ICD-10-CM | POA: Diagnosis not present

## 2015-07-15 DIAGNOSIS — Z8639 Personal history of other endocrine, nutritional and metabolic disease: Secondary | ICD-10-CM | POA: Insufficient documentation

## 2015-07-15 DIAGNOSIS — Z91040 Latex allergy status: Secondary | ICD-10-CM | POA: Insufficient documentation

## 2015-07-15 DIAGNOSIS — Z87440 Personal history of urinary (tract) infections: Secondary | ICD-10-CM | POA: Diagnosis not present

## 2015-07-15 DIAGNOSIS — J441 Chronic obstructive pulmonary disease with (acute) exacerbation: Secondary | ICD-10-CM | POA: Insufficient documentation

## 2015-07-15 DIAGNOSIS — Z79899 Other long term (current) drug therapy: Secondary | ICD-10-CM | POA: Diagnosis not present

## 2015-07-15 DIAGNOSIS — Z87448 Personal history of other diseases of urinary system: Secondary | ICD-10-CM | POA: Diagnosis not present

## 2015-07-15 DIAGNOSIS — K219 Gastro-esophageal reflux disease without esophagitis: Secondary | ICD-10-CM | POA: Insufficient documentation

## 2015-07-15 DIAGNOSIS — D649 Anemia, unspecified: Secondary | ICD-10-CM | POA: Insufficient documentation

## 2015-07-15 LAB — BASIC METABOLIC PANEL
ANION GAP: 7 (ref 5–15)
BUN: 14 mg/dL (ref 6–20)
CHLORIDE: 100 mmol/L — AB (ref 101–111)
CO2: 30 mmol/L (ref 22–32)
Calcium: 9.9 mg/dL (ref 8.9–10.3)
Creatinine, Ser: 1 mg/dL (ref 0.44–1.00)
GFR, EST AFRICAN AMERICAN: 56 mL/min — AB (ref >60)
GFR, EST NON AFRICAN AMERICAN: 48 mL/min — AB (ref >60)
Glucose, Bld: 127 mg/dL — ABNORMAL HIGH (ref 65–99)
POTASSIUM: 4 mmol/L (ref 3.5–5.1)
SODIUM: 137 mmol/L (ref 135–145)

## 2015-07-15 LAB — I-STAT TROPONIN, ED
Troponin i, poc: 0 ng/mL (ref 0.00–0.08)
Troponin i, poc: 0 ng/mL (ref 0.00–0.08)

## 2015-07-15 LAB — CBC
HCT: 43.8 % (ref 36.0–46.0)
HEMOGLOBIN: 14.2 g/dL (ref 12.0–15.0)
MCH: 29.6 pg (ref 26.0–34.0)
MCHC: 32.4 g/dL (ref 30.0–36.0)
MCV: 91.3 fL (ref 78.0–100.0)
Platelets: 171 10*3/uL (ref 150–400)
RBC: 4.8 MIL/uL (ref 3.87–5.11)
RDW: 13.3 % (ref 11.5–15.5)
WBC: 5.8 10*3/uL (ref 4.0–10.5)

## 2015-07-15 NOTE — Discharge Instructions (Signed)
Contact Dr.Nadel's office on Monday  07/18/15 to schedule an office visit. You may need further testing to check your heart as an outpatient. Return if your condition worsens for any reason or if concerned.

## 2015-07-15 NOTE — ED Notes (Addendum)
Pt reports SOB and chest tightness starting 15 minutes ago. Pt states that it started after walking around. Pt denies pain. Pt states "im just real nervous now". Pt states that she had a similar episode a few weeks ago.

## 2015-07-15 NOTE — ED Provider Notes (Addendum)
CSN: [id number]     Arrival date & time 07/15/15  1447 History   First MD Initiated Contact with Patient 07/15/15 1754     Chief Complaint  Patient presents with  . Shortness of Breath     (Consider location/radiation/quality/duration/timing/severity/associated sxs/prior Treatment) HPI Patient complaint of shortness of breath this morning approximately 11 AM which lasted 15-20 minutes, resolve spontaneously dyspnea was onset while seated in an extremely hot environment. No treatment prior to coming here she is now asymptomatic. She's had similar episodes multiple times in the past which her daughter reports that but attributed to anxiety. She denies cough denies fever. No other associated symptoms. She does complain of mild right sided anterior chest pain which has been constant for several days. Nothing made symptoms better or worse. Past Medical History  Diagnosis Date  . Shortness of breath   . Unspecified essential hypertension   . Right bundle branch block   . Other and unspecified hyperlipidemia   . Other abnormal glucose   . Diverticulosis of colon (without mention of hemorrhage)   . Pyelonephritis, unspecified   . Solitary cyst of breast   . Osteoarthrosis, unspecified whether generalized or localized, unspecified site   . Lumbago   . Spinal stenosis, unspecified region other than cervical   . Anemia, unspecified   . GERD (gastroesophageal reflux disease)   . Pyelonephritis   . DJD (degenerative joint disease)   . UTI (lower urinary tract infection)    COPD Past Surgical History  Procedure Laterality Date  . Cataract extraction    . Right hip hemiarthroplasty      total  . Conversion to right thr     Family History  Problem Relation Age of Onset  . Cancer Brother   . Cancer Brother    Social History  Substance Use Topics  . Smoking status: Never Smoker   . Smokeless tobacco: Never Used  . Alcohol Use: No   OB History    No data available     Review of  Systems  Constitutional: Negative.   HENT: Negative.   Respiratory: Positive for shortness of breath.   Cardiovascular: Positive for chest pain.  Gastrointestinal: Negative.   Musculoskeletal: Negative.   Skin: Negative.   Neurological: Negative.   Psychiatric/Behavioral: Negative.   All other systems reviewed and are negative.     Allergies  Azithromycin; Ciprofloxacin; Levofloxacin; Latex; and Other  Home Medications   Prior to Admission medications   Medication Sig Start Date End Date Taking? Authorizing Provider  ADVAIR DISKUS 100-50 MCG/DOSE AEPB INHALE 1 PUFF INTO THE LUNGS EVERY 12 HOURS AS NEEDED. 06/13/15   Michele Mcalpine, MD  ALPRAZolam Prudy Feeler) 0.5 MG tablet Take 1 tablet (0.5 mg total) by mouth 3 (three) times daily as needed for anxiety. 07/06/15   Michele Mcalpine, MD  Alum & Mag Hydroxide-Simeth (MAGIC MOUTHWASH W/LIDOCAINE) SOLN Take 5 mLs by mouth 4 (four) times daily as needed for mouth pain. Patient taking differently: Take 5 mLs by mouth daily as needed for mouth pain. Swish and swallow 01/06/15   Michele Mcalpine, MD  aspirin EC 81 MG tablet Take 81 mg by mouth daily.    Historical Provider, MD  BENZOCAINE, DENTAL, (ANBESOL MAXIMUM STRENGTH) 20 % LIQD Place 1 application onto teeth daily as needed (gum irritation).    Historical Provider, MD  clonazePAM (KLONOPIN) 0.5 MG tablet Take 0.5 tablets (0.25 mg total) by mouth 2 (two) times daily as needed for anxiety. Patient not taking:  Reported on 06/09/2015 03/22/15   Michele Mcalpine, MD  ferrous sulfate 325 (65 FE) MG tablet Take 325 mg by mouth daily with breakfast.    Historical Provider, MD  Fluticasone-Salmeterol (ADVAIR) 100-50 MCG/DOSE AEPB Inhale 1-2 puffs into the lungs daily.    Historical Provider, MD  furosemide (LASIX) 20 MG tablet Take 1 tablet (20 mg total) by mouth daily. Patient taking differently: Take 20 mg by mouth See admin instructions. Take 1 tablet (20 mg) by mouth every morning, may take an additional tablet  if needed for ankle swelling 01/26/15   Michele Mcalpine, MD  LORazepam (ATIVAN) 0.5 MG tablet Take 0.5 tablets (0.25 mg total) by mouth every 8 (eight) hours as needed for anxiety. 06/12/15   Arby Barrette, MD  Multiple Vitamin (MULTIVITAMIN WITH MINERALS) TABS tablet Take 1 tablet by mouth daily. Centrum Silver    Historical Provider, MD  NORVASC 5 MG tablet TAKE 1 TABLET ONCE DAILY. 12/24/14   Michele Mcalpine, MD  OVER THE COUNTER MEDICATION Take 1 application by mouth daily as needed (sinus congestion). Rawleigh's Salve - apply to nostrils    Historical Provider, MD  polyethylene glycol (MIRALAX / GLYCOLAX) packet Take 17 g by mouth daily as needed for mild constipation.     Historical Provider, MD  potassium chloride (K-DUR,KLOR-CON) 10 MEQ tablet Take 2 tablets (20 mEq total) by mouth daily. Patient taking differently: Take 10 mEq by mouth See admin instructions. Take 1 tablet (10 meq) with prednisone dose 03/22/15   Michele Mcalpine, MD  predniSONE (DELTASONE) 10 MG tablet Take 5 mg by mouth See admin instructions. Take 1/2 tablet (5 mg) daily for 3 days for itching; hold for 10 days, then restart if needed for itching 05/21/15   Historical Provider, MD  PROAIR HFA 108 (90 BASE) MCG/ACT inhaler INHALE 2 PUFFS EVERY 4 HOURS AS NEEDED 06/13/15   Michele Mcalpine, MD  ranitidine (ZANTAC) 150 MG tablet Take 150 mg by mouth daily.    Historical Provider, MD  Spacer/Aero-Holding Chambers (AEROCHAMBER PLUS WITH MASK) inhaler Use as instructed 06/12/15   Arby Barrette, MD   BP 158/62 mmHg  Pulse 59  Temp(Src) 97.7 F (36.5 C) (Oral)  Resp 19  Ht 5\' 8"  (1.727 m)  Wt 170 lb (77.111 kg)  BMI 25.85 kg/m2  SpO2 100% Physical Exam  Constitutional: She appears well-developed and well-nourished.  HENT:  Head: Normocephalic and atraumatic.  Eyes: Conjunctivae are normal. Pupils are equal, round, and reactive to light.  Neck: Neck supple. No tracheal deviation present. No thyromegaly present.  Cardiovascular: Normal  rate and regular rhythm.   No murmur heard. Pulmonary/Chest: Effort normal and breath sounds normal. She exhibits tenderness.  Right rib cage tender anteriorly, reproducing pain exactly  Abdominal: Soft. Bowel sounds are normal. She exhibits no distension. There is no tenderness.  Musculoskeletal: Normal range of motion. She exhibits no edema or tenderness.  Neurological: She is alert. Coordination normal.  Skin: Skin is warm and dry. No rash noted.  Psychiatric: She has a normal mood and affect.  Nursing note and vitals reviewed.   ED Course  Procedures (including critical care time) Labs Review Labs Reviewed  BASIC METABOLIC PANEL - Abnormal; Notable for the following:    Chloride 100 (*)    Glucose, Bld 127 (*)    GFR calc non Af Amer 48 (*)    GFR calc Af Amer 56 (*)    All other components within normal limits  CBC  Rosezena Sensor, ED    Imaging Review Dg Chest 2 View  07/15/2015   CLINICAL DATA:  Weakness. Difficulty catching breath. Chest tightness  EXAM: CHEST  2 VIEW  COMPARISON:  06/12/2015  FINDINGS: The heart size and mediastinal contours are within normal limits. Coarsened interstitial markings are noted bilaterally. Asymmetric elevation of right hemidiaphragm. The visualized skeletal structures are unremarkable.  IMPRESSION: No active cardiopulmonary disease.   Electronically Signed   By: Signa Kell M.D.   On: 07/15/2015 15:31   I have personally reviewed and evaluated these images and lab results as part of my medical decision-making.   EKG Interpretation   Date/Time:  Friday July 15 2015 14:52:31 EDT Ventricular Rate:  88 PR Interval:  248 QRS Duration: 120 QT Interval:  382 QTC Calculation: 462 R Axis:   -72 Text Interpretation:  Sinus rhythm with 1st degree A-V block Left axis  deviation Low voltage QRS Right bundle branch block Inferior infarct , age  undetermined Cannot rule out Anterior infarct , age undetermined Abnormal  ECG No significant  change since last tracing Confirmed by Ethelda Chick  MD,  Paizlee Kinder [id number]) on 07/15/2015 6:20:03 PM     Chest x-ray viewed by me Results for orders placed or performed during the hospital encounter of 07/15/15  Basic metabolic panel  Result Value Ref Range   Sodium 137 135 - 145 mmol/L   Potassium 4.0 3.5 - 5.1 mmol/L   Chloride 100 (L) 101 - 111 mmol/L   CO2 30 22 - 32 mmol/L   Glucose, Bld 127 (H) 65 - 99 mg/dL   BUN 14 6 - 20 mg/dL   Creatinine, Ser 4.09 0.44 - 1.00 mg/dL   Calcium 9.9 8.9 - 81.1 mg/dL   GFR calc non Af Amer 48 (L) >60 mL/min   GFR calc Af Amer 56 (L) >60 mL/min   Anion gap 7 5 - 15  CBC  Result Value Ref Range   WBC 5.8 4.0 - 10.5 K/uL   RBC 4.80 3.87 - 5.11 MIL/uL   Hemoglobin 14.2 12.0 - 15.0 g/dL   HCT 91.4 78.2 - 95.6 %   MCV 91.3 78.0 - 100.0 fL   MCH 29.6 26.0 - 34.0 pg   MCHC 32.4 30.0 - 36.0 g/dL   RDW 21.3 08.6 - 57.8 %   Platelets 171 150 - 400 K/uL  I-stat troponin, ED  Result Value Ref Range   Troponin i, poc 0.00 0.00 - 0.08 ng/mL   Comment 3          I-stat troponin, ED  Result Value Ref Range   Troponin i, poc 0.00 0.00 - 0.08 ng/mL   Comment 3           Dg Chest 2 View  07/15/2015   CLINICAL DATA:  Weakness. Difficulty catching breath. Chest tightness  EXAM: CHEST  2 VIEW  COMPARISON:  06/12/2015  FINDINGS: The heart size and mediastinal contours are within normal limits. Coarsened interstitial markings are noted bilaterally. Asymmetric elevation of right hemidiaphragm. The visualized skeletal structures are unremarkable.  IMPRESSION: No active cardiopulmonary disease.   Electronically Signed   By: Signa Kell M.D.   On: 07/15/2015 15:31    MDM  Doubt acute coronary syndrome. Symptoms highly atypical. Patient has no history of coronary disease. Symptoms were brief heart score equals 4. I feel that she can have outpatient evaluation.exam c/w chest wall pain,. Patient and daughter are in agreement Suggest follow-up Dr. Kriste Basque next  week Diagnosis dyspnea  Final diagnoses:  None        Doug Sou, MD 07/15/15 1940  Doug Sou, MD 07/15/15 [id number]

## 2015-07-21 ENCOUNTER — Ambulatory Visit (INDEPENDENT_AMBULATORY_CARE_PROVIDER_SITE_OTHER): Payer: Medicare Other | Admitting: Pulmonary Disease

## 2015-07-21 ENCOUNTER — Encounter: Payer: Self-pay | Admitting: Pulmonary Disease

## 2015-07-21 VITALS — BP 130/64 | HR 81 | Temp 98.4°F | Wt 175.8 lb

## 2015-07-21 DIAGNOSIS — R0602 Shortness of breath: Secondary | ICD-10-CM

## 2015-07-21 DIAGNOSIS — M150 Primary generalized (osteo)arthritis: Secondary | ICD-10-CM

## 2015-07-21 DIAGNOSIS — I872 Venous insufficiency (chronic) (peripheral): Secondary | ICD-10-CM

## 2015-07-21 DIAGNOSIS — R269 Unspecified abnormalities of gait and mobility: Secondary | ICD-10-CM

## 2015-07-21 DIAGNOSIS — I10 Essential (primary) hypertension: Secondary | ICD-10-CM

## 2015-07-21 DIAGNOSIS — R609 Edema, unspecified: Secondary | ICD-10-CM

## 2015-07-21 DIAGNOSIS — F419 Anxiety disorder, unspecified: Secondary | ICD-10-CM | POA: Diagnosis not present

## 2015-07-21 DIAGNOSIS — M159 Polyosteoarthritis, unspecified: Secondary | ICD-10-CM

## 2015-07-21 DIAGNOSIS — I4510 Unspecified right bundle-branch block: Secondary | ICD-10-CM

## 2015-07-21 DIAGNOSIS — K219 Gastro-esophageal reflux disease without esophagitis: Secondary | ICD-10-CM

## 2015-07-21 DIAGNOSIS — K5909 Other constipation: Secondary | ICD-10-CM

## 2015-07-21 NOTE — Progress Notes (Signed)
Subjective:    Patient ID: [name], female    DOB: January 03, 1923, 81 y.o.   MRN: [id number]  HPI 81 y/o WF here for a follow up visit... she has multiple medical problems as noted below...  Followed for general medical purposes w/ hx chr obstructive asthma, severe episodic dyspnea from anxiety, HBP, RBBB, Hypercholesterolemia, borderline DM, DJD, LBP w/ sp stenosis, etc... ~  SEE PREV EPIC NOTES FOR THE OLDER DATA >>    CTAngio Chest 05/2009 showed no evid of PE, biapical pleuroparenchymal scarring otherw clear lungs, no adenopathy/ effusions/ etc...  CXR 3/15 showed norm heart size, clear lungs, elev of right hemidiaph, NAD...  LABS 3/15:  Chems- wnl;  CBC- wnl;  BNP=26...  LABS 5/15:  Chems- wnl x BS=120;  CBC- wnl;  BNP= 225    ~  July 30, 2014:  6wk ROV & recheck>  [name] continues to complain of ?difficulty swallowing, ?food sticking in upper esoph, early satiety/ burping & belching according to the daughter; "sometimes I have to ride around in the car with windows open to get more oxygen" which I mentioned makes no sense regarding her esoph & swallowing etc; daughter doesn't think that Protonix Bid is helping & wonders if they could try Nexium40 "since it's stonger"; her weight is stable ~168# w/ BMI=25; we reviewed prev eval w/ UGI series showing mod esoph dysmotility problem (likely presbyesoph) but no mucosal abn evident; prev EGD was done in 2002 and WNL; we reviewed DrPerry's GI evaluation> we decided to proceed w/ MBS by Speech Path & OK to try the Nexium...  I have again recommended that the pt try to increase her Klonopin from 0.53m Qhs to one Bid or perhaps try 1/2 in AM, 1/2 in afternoon, and 1 at bedtime...     Finally the daughter asked about ordering some Physical Therapy for her mother; they feel she is getting weaker esp in legs, slower, and balance is off; we reviewed the need for exercise and we will request PT consult & their involvement We reviewed prob list,  meds, xrays and labs> see below for updates >> OK 2015 flu vaccine today  ~  September 28, 2014:  266moOV & [name] says "I'm not doing well"; c/o skin rash- daughter thinks it's the EKG tabs (but she has rash on lowe back/ buttock), states "allergic to elastic", she has seen Derm- DrDJones on 3 diff creams (currently on clobetasol0.05% & Eurcin calming lotion); they did Bx= "looked ok, it was neg";  He is apparently considering an immunosuppressant med 7 want her checked for Tb- we will send Quantiferon Gold...     She saw DrJEdwards for GI> nothing avail in EPIC yet but daughter states he did MBS& he rec warm liquids w/ her meals & she is 90% better decr burbs, decr gas, swallowing better; pt stopped Nexium on her own & refuses restart...    On Advair100, AlbutHFA, MMW> all being used just prn & she states breathing is 90% better...    On Amlod5 & HCT12.5; BP= 140/70 w/o CP, palpit, SOB, edema...     She is getting outpt PT now- helping she says...    She is off the Klonopin & refuses to restart this med (daugh states she never really took the first pill)... We reviewed prob list, meds, xrays and labs> see below for updates >>  PLAN>> we wrote for Atarax 1029m6h prn itching; we sent Quantiferon gold- NEG...   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, etc;  She has mult somatic complaints- good days & bad, hard to sort out the salient features, I have prev rec a low dose Klonopin Bid to help w/ this & her dyspnea but daugh states she has never taken it, we discussed this again;  CC is leg pain- difficulty walking, uses cane, known spinal stenosis, she's had PT, she's had shots and encouraged  to f/u w/ drRamos about this; not on pain meds "I use natural products";  Also c/o ?dyspnea as before, phlegm in throat, "attacks" of swallowing trouble which we have investigated w/ Ba swallow/ UGI, speech path eval, etc & she is rec to take Nexium 40Bid; daughter states she occas has to drive her around in her car w/ window 1/2 open to get relief (I again rec use of the Klonopin); offered GI eval w/ DrPerry or DrJEdwards & they will decide; daugh notes that they have tried everything for the phlegm in throat including Mucinex, Rock&Rye, Rawleigh's salve, Vicks vaporub, Slippery [place]m lozenges, rolaids, warm honey & lemon, MMW; they wonder about food allergy & they are encouraged to f/u w/ GI;  Finally they mention her rash- sm patch on her back over sacrum & she was tried on several creams per DrJones- intol to some of them- still on Prednisone 259md & she needs to wean this off, they have f/u appt in several days...     There have been mult phone calls in the interim> feeling weak, SOB, edema; daugh states she not taking her Advair, Klonopin, on HCT25-1/2 daily, they didn't want to incr meds but requested Cards eval for "CHF"-ok...    She saw DrCherly Hensen2/30/15 but wouldn't allow EKG due to "allergy to the electrodes"; his note is reviewed- he agreed w/ ARB/diuretic but noted pts refusal & she wanted to be back on low dose Amlod; no signs of CHF- they did 2DEcho showing norm LV size & function w/ EF=65-70%, AoV leaflets mildly thickened w/o AS, MV leaflets mod thickened w/ trivMR, mild RA dil, PAsys=3738m; no change in meds...     She went to the ED 11/22/14 w/ a choking episode, SOB, altered mental status> note reviewed- all symptoms resolved spont; they did CXR- NAD; and Blood work- wnl...    Derm eval DrDJones w/ Dx of bullous pemphigoid- on Pred 29m49m& they understand that this can contrib to edema; advised 2gm Na diet etc; they do not want stronger diuretic...  We reviewed prob list, meds, xrays and  labs> see below for updates >>   CXR 1/16 showed norm heart size, clear lungs, mild right diaph eventration- no change, Tspine DJD. DISH/ osteopenia; NAD...   2DEcho 1/16 showed norm LV size & function w/ EF=65-70%, AoV leaflets mildly thickened w/o AS, MV leaflets mod thickened w/ trivMR, mild RA dil, PAsys=37mm16m.  LABS 1/16:  Chems- wnl w/ Cr=0.95;  BNP=66;  CBC- wnl w/ Hg=14.4...  ~  January 26, 2015:  6wk ROV & [name] w/ her daughter ShirlPearlean Brownie appears stable and has no new complaints but retains mult minor somatic complaints as usual; the biggest issue is disposition & personal family matters; [name]  continues to live in her house alone, daughter does a great job of caring for her but it is wearing her out & pt is not always cooperative... They have looked into AL vs skilled care at several facities but they have not been to her satisfaction;  We will try for a [facility] home assessment & i told them i would be happy to assist in any way possible...     She has gained 9# & has 1-2+ pitting edema in LEs; daughter states she will only eat [employer] food "I like it" & we reviewed low sodium options; we decided to chenage her HCT25-1/2 tab to [place] per day...    She needs to increase her exercise program    We reviewed prob list, meds, xrays and labs> see below for updates >>  PLAN>> they will continue to look into AL situations; we will request [facility] home health assessment; change HCT to LASIX62m Qam, no salt, elev legs, try support hose...  ~  Mar 22, 2015:  273moOV & [name] went to the ER 03/15/15> Notes reviewed- presented w/ SOB, noted incr swelling, not resting well; Exam showed Afeb, VSS, O2sat=94%, Lungs clear no wheezing, Cardiac exam neg, +edema in feet; CXR showed no edema, NAD; Labs- ok x K=3.0 (she is supposed to be on K20/d);  They gave her Lasix40 in the ER and they wondered about anxiety; Daughter indicates that she had been anxious after she pulled the cord off a ceiling  fan & could not fix it; she also states that mother is not regular w/ her meds "she takes what she wants to take" but pt says "I take it when I think I need it"; they cannot agree on placement, they declined home health services; she has Klonopin 0.10m461mo take but pt won't take it> daugh says she stays outside on back porch til ~9pm daily ("I breath better outside") then goes in to get ready for bed...     EXAM shows Afeb, VSS, O2sat=93% on RA, wt is down 3#;  Chest- clear w/o w/r/r;  Heart- RR w/o m/r/g;  Ext- neg x trace edema...   CXR 4/16 in ER> norm heart size, atherosclerosis of Ao, no edema or consolidation in lungs, eventration of right hemidiaph- no change, DJD spine.  EKG 4/16 in ER> NSR, rate72, PACs, LAD, RBBB  LABS 4/16 in ER> Chems- wnl x K=3.0;  CBC- wnl;  Troponin=neg;  BNP=69 PLAN>>  I have instructed pt & daugh to increase her KCl to 14m26mid & take it regularly; she is encouraged to try the Klonopin 1/2 tab bid regularly for he anxiety...   ~  June 09, 2015:  61mo 461mo& [name] indicates that she is doing satis, feels well, no new complaints or concerns, but there is stillalot of tension betw her & daughter shirley who does a fabulous job looking after & caring for her mother... They now have a lady who comes in 4PM-9PM to help MargaCozetteshe is not happy- "we just sit and eat ice cream"; [name] (tearful) notes no further anxiety/ panic attacks in the eve hours, mother doesn't want to accept help;  Counseling is recommended but they decline...     Chr Obstructive Asthma> her dyspnea is from anxiety/panic not asthma; on Advair100Bid (but only using it prn) & Proventil rescue prn; notes breathing back to baseline & prn Klonopin helps dyspnea but she won't use it...    HBP> on ASA81, Norvasc5, Lasix20, K10-2/d; BP=126/80, tol meds  well; denies CP, palpit, ch in SOB, tr edema...    CHOL> on diet alone, refuses meds, FLP 3/14 shows TChol 177, TG 50, HDL 60, LDL 107    DM> on diet alone,  wt stable at 172#, BS=100-120, last A1c (2/13) was 6.3 & she knows to restrict carbs etc...    GI- Reflux, Divertics, constip> prev on Protonix40, Miralax, Senakot-S; continue same meds.[name]    DJD/ LBP> on Pred10, OTC analgesics prn & osteobiflex prn; had right THR 2011; known sp stenosis w/ prev ESI... We reviewed prob list, meds, xrays and labs>  IMP/PLAN>>  Mother & [name] really need counseling but it's not going to happen; they are both wonderful people- Mother 3y/o and has mild senile dementia, daughter very loving & cares for mother deeply, worries, etc; mother needs med supervision and monitoring but refuses AL etc; and so it goes...   ~  July 21, 2015:  6wk ROV & add-on appt after 2 ER visits for dyspnea precipitated by anxiety> as prev noted- very difficult situation w/ 62 y/o mother who insists on living alone, doing her own meds, etc but incapable of doing all that needs to be done & remembering her meds etc;  Daughter has done all she can do as there is alot of history in their relationship- every visit the daughter is the one who cries and mother is just angry;  They have a woman who stays with the pt at night but she won't allow help during the day;  Episodes of dyspnea are ppt by panic attacks yet pt won't take benzos regularly & forgets to take them w/ these attacks- just calls the daugh & they freq wind up in the ER... Pt's son thinks her use of the rescue inhaler is an "upper" & causes problems, he wants her on oxygen instead & I reviewed Medicare guidelines- we will check ambulatory oxygen sat test today & sched ONO, otherw I offered to order Oxygen for them to self-pay but they decline... Pt flat out refused my rec for Klonopin Bid, she has Xanax 0.62m tabs which she uses sparingly, and given Ativan155mfrom ER but says she doesn't have this med...      ER visit 07/15/15> c/o episode of SOB lasting 2055m ppt by sitting at home w/o [facility], Exam was neg w/ norm VS & O2sat=98%  on RA, she had sl tender right chest wall, CXR showed norm heart size/ clear lungs/ NAD, EKG showed NSR/ rate88/ RBBB/ LAD, old infer scar/ no acute changes, LABS wnl & enz were neg... Symptoms resolved spont & she was disch home...      ER visit 06/12/15> another episode of SOB & no insight into cause but daugh indicates it was from anxiety/panic when she was home alone; Exam clear, VSS;  CXR was again wnl & Labs wnl as well; they gave her Ativan to try but she hasn't used it; she was even given an aerochamber to use w/ her inhaler but she won't use that... EXAM reveals Afeb, VSS, O2sat=95% on RA;  HEENT- neg;  Chest- clear w/o w/r/r, sl tender on palp;  Heart- RR gr1/6 SEM no r/g;  Abd- soft, non-tender, neg;  Ext- VI, tr edema, no c/c;  Neuro- intact, walks w/ cane, anxious... We reviewed prob list, meds, xrays and labs> SHE DID NOT BRING MED BOTTLES OR LIST TO THE OV TODAY- reminded to do so for every visit!  CXR 7/24 & 07/15/15 showed norm  heart size, tortuous Ao, clear lungs w/ mild elev of right hemidiaph, NAD.Marland KitchenMarland Kitchen  EKG 07/15/15 showed chronic changes- NSR/ rate88/ RBBB/ LAD/ old infer scar/ no acute abnormalities...  LABS 7-06/2015> Chems- wnl x BS=127-147;  BNP=55;  Troponins=neg;  CBC- wnl IMP/PLAN>>  I had another long talk w/ [name] & her daughter [name]; I have again rec that she use a low dose of the ALPRAZOLAM 0.76m tabs- 1/2 tab Tid regularly at breakfast, lunch, & dinner; she may also take an extra 1/2 tab prn anytime she feels anxious or panic setting in;  We have suggested similar plan many times in the past & for whatever reason she will not do it!  I have offered Psyche referral or to set her up w/ a counselor but this too is declined;  SEnid Derryis having a very hard time w/ her mother's attitude, lack of cooperation, mild dementia at age 81 etc...           Problem List:       DYSPNEA (ICD-786.05) - long hx of chronic obstructive asthma treated w/ ADVAIR100Bid & PROAIR (she uses  them Prn now)... she is a non-smoker w/ some reactive airways disease in the past & retired from [employer] years in 1991... she denies cough, sputum, hemoptysis, worsening dyspnea, wheezing, chest pains, snoring, daytime hypersomnolence, etc...  ~  baseline CXR w/o acute changes...  ~  PFT's 5/02 w/ FVC 2.07 (68%), FEV1=1.36 (57%), and FEV1/FVC ratio=66%, mid-flows 42%... ~  CT Angio 7/10 was neg- x biapical pleuroparenchymal scarring... ~  CXR 10/11 showed sl elev right hemidaiph, mild DJD sp, osteopenia, NAD..[name] ~  CXR 6/12 showed mild apical scarring, clear & NAD, DJD sp w/ osteophytes... ~  Intermittent dyspnea more related to anxiety & treated w/ KLONOPIN 0.526m1/2 to 1 tab Bid==> improved. ~  CXR 4/13 showed normal heart size, clear lungs, DJD in TSpine... ~  CXR 2/14 showed normal heart size, clear lungs w/ sl peribronch thickening, DJD in spine, NAD...[name]~  CXR 3/15 showed norm heart size, clear lungs, elev of right hemidiaph, NAD... ~  She is encouraged to take the Klonopin 0.37m46mid regularly- consider taking 1/2 in AM, 1/2 in afternoon, one at bedtime... ~  1/16: she continues w/ mult somatic complaints and intermittent choking episodes etc; she refuses to take the Advair or Klonopin... ~  CXR 1/16 showed norm heart size, clear lungs, mild right diaph eventration- no change, Tspine DJD. DISH/ osteopenia; NAD... ~  3/16: she is stable on current meds, asked to incr her non-existent exercise program... ~  4/16: went to ER w/ SOB- nothing found x mild edema; given Lasix40, potassium was low, thought to be anxious; in office f/u her daugh confirms anxiety & pt not taking her meds regularly; asked to take meds every day & try the Klonopin... ~  7/16: same thing- went to ER w/ dyspnea, nothing found & given Ativan but she wouldn't take it... ~  8/16: another episode w/ ER eval and symptoms resolved spontaneously on there own... ~  9/16: pt is advised (again) to start regular dosing of  Alpraz 0.37mg63m1/2 tab Tid w/ extra 1/2 tab as needed for nerves...  HYPERTENSION (ICD-401.9) - controlled on NORVASC 37mg 40mly & HCTZ 237mgt54maily...  ~  2/13:  BP 144/70 today> tol rx well & denies HA, visual changes, CP, palipit, dizziness, syncope, edema, etc... ~  4/13:  BP= 148/80 & she denies CP, palpit, edema; dyspnea improved w/ Klonopin. ~  6/13:  BP= 132/68 & as noted she has mult somatic complaints... ~  3/14:  on Norvasc10, HCTZ25; BP=136/60, tol meds well; denies CP, palpit, ch in SOB, edema, etc  ~  9/14:  BP controlled on Amlod5 & Hct25-1/2 daily; BP= 130/70 & she denies CP, palpit, dizzy, SOB, edema, etc. ~  3/15: on ASA81, Norvasc5, Lasix20- 1-2/d; BP=136/70, tol meds well; denies CP, palpit, ch in SOB, but notes persist edema in legs; Rec to ch Amlod5 to Losar50, low sodium, elev legs, Lasix40. ~  5/15: on Losar50, Lasix40;  BP= 138/60 & she denies angina pain, palpit, ch in SOB, etc... ~  7/15: on Losar50, she stopped Lasix; BP= 138/78 and they want to go back on Amlod5 + HCT12.5 daily... ~  9/15: on Amlod5, Hct12.5; BP= 128/80 & she likes this combo better- denies CP, palpit, etc... ~  1/16: on Amlod5, Hct12.5 but ?what she is taking; BP= 130/80 & she has refused the ARBs... ~  3/16: on Amlod5, Hct12.5; BP= 142/80 & she has gained 9# w/ 1-2+ edema; discussed low sodium & change Hct to LASIX20 Qam... ~  5/16: on Amlod5, Lasix20, K20; BP=148/70, recent K=3.0 & daugh notes she's not taking meds regularly; asked to take meds everyday & incr K20Bid...  RIGHT BUNDLE BRANCH BLOCK (ICD-426.4) - on ASA 24m/d... baseline EKG w/ RBBB and 2DEcho 5/02 showed mild asymmetric LVH w/ incr EF... ~  12/15: she had Cards eval by DrNishan> he rec ARB/diuretic but she refused to switch from her Amlod5/ Hct12.5 regimen; she refused EKG due to "allergy to the electrodes"... ~  2DEcho 1/16 showed norm LV size & function w/ EF=65-70%, AoV leaflets mildly thickened w/o AS, MV leaflets mod thickened w/  trivMR, mild RA dil, PAsys=351mg... ~  EKG 4/16 showed NSR, rate72, PACs, LAD, RBBB  VENOUS INSUFFIC & EDEMA >>  ~  3/15: she was switched off Amlod & onto Losar50, Lasix20=>40, plus no salt, elevation, support hose, etc...  ~  9/15: [facility]; she has VI & 1+edema but stable, no acute changes... 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 ~  3/16: she remains on the Pred from Derm, now w/ incr edema & 9# wt gain, rec no salt & change Hct to LASIX20/d... ~  5/16: improved on Lasix20 w/ edema decr 7 wt down several lbs...  HYPERLIPIDEMIA (ICD-272.4) - on diet alone... she forgets to come to visits FASTING for this blood work ~  FLNiotaze/07 showed TChol 205, TG 71, HDL 49, LDL 130... ~  FLP 6/12 on diet alone showed TChol 218, TG 50, HDL 66, LDL 129 ~  FLP 2/13 on diet alone showed TChol 171, TG 43, HDL 66, LDL 97 ~  FLP 3/14 on diet alone showed TChol 177, TG 50, HDL 60, LDL 107 ~  She needs to ret FASTING for f/u FLP...  DIABETES MELLITUS, BORDERLINE (ICD-790.29) - on diet alone w/ prev BS's in the 100-160 range... ~  labs in 2008-9 showed BS= 101 to 108 ~  labs 1/10 showed BS= 106, A1c= 5.9 ~  Labs 6/12 showed BS= 93, A1c= 6.6...Marland KitchenMarland Kitchenec diet, exercise... ~  Labs 2/13 showed BS= 92, A1c= 6.3 ~  3/14: on diet alone, wt stable ~174#, BS=97, last A1c (2/13) was 6.3 & she knows to restrict carbs etc. ~  Labs 3/15 showed BS= 95; and BS= 120 in [id number].  ~  Labs 2016  showed BS= 101-135  INDIGESTION/ REFLUX SYMPTOMS >> see 10/12 note & PROTONIX 14m/d started, further eval if symptoms persist... ESOPHAGEAL DYSMOTILITY/ PRESBYESOPHAGUS >>  ~  4/13:  She notes some reflux symptoms and excess gas w/ belching; rec to take the Protonix daily & Simethacone vs Tums which she says helps her gas. ~  5/13:  She saw GI DrPerry w/ rec to take Prilosec for her indigestion... ~  5/15:  She presented w/ worsening indigestion,  dysphagia, reflux & Prev30 was incr to Bid w/ GI f/u suggested for EGD... 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 ~  9/15:  Symptoms persist but she is not regurg or vomiting, and weight stable; they want to change to Nexium40Bid-OK, and rec proceed w/ MBS by speech path... ~  She continues to have intermit choking episodes and c/o phlegm in her throat; she has tried "everything" & encouraged to f/u w/ GI- DrPerry/ DrJEdwards...  DIVERTICULOSIS OF COLON (ICD-562.10) - she takes SENAKOT-S, MIRALAX, Peppermint Tea, & sauerkraut Prn...last colonoscopy 9/02 by DrPerry was WNL...  PYELONEPHRITIS (ICD-590.80) - SEE 1/09 Hospitalization (reviewed)... ~  She saw DrMacDiarmid for her recurrent UTIs, chronic cystitis, urge & stress incont, nocturia; she is INTOL to [facility]..  Hx of BREAST CYST (ICD-610.0)  DEGENERATIVE JOINT DISEASE (ICD-715.90) - s/p right hip hemiarthroplasty 11/09 by DrAplington w/ wound complic... then dx w/ loosening of the femoral shaft & had conversion to right THR by DrAlusio 10/11 & much improved... she uses CELEBREX 2062mPrn (seldom takes this).  LOW BACK PAIN SYNDROME (ICD-724.2) & SPINAL STENOSIS (ICD-724.00) - severe LBP & spinal stenosis w/ evals by DrRamos & DrNudelman... s/p shots, considering poss surgery vs alternative therapies... she takes Celebrex, Osteobiflex, MVI, Vit D... ~  8/10: eval by DrAplington- diff leg lengths, lift placed in right shoe, then trial Lyrica5064m. ~  12/11:  improved after hip revision surg (to THR) 10/11 w/ better ambulaton... ~  5/13:  DrRamos gave her another ESI for her leg pain related to sp stenosis... ~  3/14:  C/o neuropathic  discomfort in legs- eval by Ramos & offered shots in her back; try Lyrica50 in the interim... ~  Persistent leg pain, on OTC "natural" meds she says; encouraged to f/u w/ DrRamos et al...  Hx of ANEMIA (ICD-285.9) - eval by GI in 2002 showed normal EGD and Colon... prob iron malabsorption problem Rx'd w/ Fe infusion... ~  labs 1/10 showed Hg= 14.6, MCV= 89, Fe= 94 ~  labs 12/11 showed Hg= 12.8, MCV= 90, Fe= 33... try Fe supplement + VitC... ~  Labs 6/12 showed Hg= 15.0 ~  Labs 2/13 showed Hg= 14.6 ~  Labs 2/14 showed Hg= 14.8 ~  Labs 3/15 showed Hg= 14.9 ~  Labs 1/16 showed Hg= 14.4 ~  Labs 4/16 showed Hg= 13.7  DERM:  rash Rx'd by dermatology- OLUX-E foam= clobetasol Foam 0.05%... 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 ~  5/16: she is still on Pred per Derm- currently 55m15m..    Past Surgical History  Procedure Laterality Date  . Cataract extraction    . Right  hip hemiarthroplasty      total  . Conversion to right thr      Outpatient Encounter Prescriptions as of 07/21/2015  Medication Sig  . ADVAIR DISKUS 100-50 MCG/DOSE AEPB INHALE 1 PUFF INTO THE LUNGS EVERY 12 HOURS AS NEEDED.  [name] ALPRAZolam (XANAX) 0.5 MG tablet Take 1 tablet (0.5 mg total) by mouth 3 (three) times daily as needed for anxiety.  . Alum & Mag Hydroxide-Simeth (MAGIC MOUTHWASH W/LIDOCAINE) SOLN Take 5 mLs by mouth 4 (four) times daily as needed for mouth pain. (Patient taking differently: Take 5 mLs by mouth daily as needed for mouth pain. Swish and swallow)  . aspirin EC 81 MG tablet Take 81 mg by mouth daily.  [name] BENZOCAINE, DENTAL, (ANBESOL MAXIMUM STRENGTH) 20 % LIQD Place 1 application onto teeth daily as needed (gum irritation).  . ferrous sulfate 325 (65 FE) MG tablet Take 325 mg by mouth daily with breakfast.  . Fluticasone-Salmeterol (ADVAIR) 100-50 MCG/DOSE AEPB Inhale 1-2 puffs into the lungs daily.  . furosemide (LASIX) 20 MG tablet Take  1 tablet (20 mg total) by mouth daily. (Patient taking differently: Take 20 mg by mouth See admin instructions. Take 1 tablet (20 mg) by mouth every morning, may take an additional tablet if needed for ankle swelling)  . Multiple Vitamin (MULTIVITAMIN WITH MINERALS) TABS tablet Take 1 tablet by mouth daily. Centrum Silver  . NORVASC 5 MG tablet TAKE 1 TABLET ONCE DAILY.  [name] OVER THE COUNTER MEDICATION Take 1 application by mouth daily as needed (sinus congestion). Rawleigh's Salve - apply to nostrils  . polyethylene glycol (MIRALAX / GLYCOLAX) packet Take 17 g by mouth daily as needed for mild constipation.   [name] PROAIR HFA 108 (90 BASE) MCG/ACT inhaler INHALE 2 PUFFS EVERY 4 HOURS AS NEEDED  . ranitidine (ZANTAC) 150 MG tablet Take 150 mg by mouth daily.  . potassium chloride (K-DUR,KLOR-CON) 10 MEQ tablet Take 2 tablets (20 mEq total) by mouth daily. (Patient not taking: Reported on 07/21/2015)  . predniSONE (DELTASONE) 10 MG tablet Take 5 mg by mouth See admin instructions. Take 1/2 tablet (5 mg) daily for 3 days for itching; hold for 10 days, then restart if needed for itching  . Spacer/Aero-Holding Chambers (AEROCHAMBER PLUS WITH MASK) inhaler Use as instructed (Patient not taking: Reported on 07/21/2015)  . [DISCONTINUED] clonazePAM (KLONOPIN) 0.5 MG tablet Take 0.5 tablets (0.25 mg total) by mouth 2 (two) times daily as needed for anxiety. (Patient not taking: Reported on 06/09/2015)  . [DISCONTINUED] LORazepam (ATIVAN) 0.5 MG tablet Take 0.5 tablets (0.25 mg total) by mouth every 8 (eight) hours as needed for anxiety. (Patient not taking: Reported on 07/21/2015)   No facility-administered encounter medications on file as of 07/21/2015.    Allergies  Allergen Reactions  . Azithromycin Shortness Of Breath  . Ciprofloxacin Other (See Comments)     hallucinations  . Levofloxacin Other (See Comments)    Insomnia, indigestion, tingling sensation in legs  . Latex Rash  . Other Rash    EKG leads caused  a rash that required steroids to clear    Current Medications, Allergies, Past Medical History, Past Surgical History, Family History, and Social History were reviewed in [employer] record.    Review of Systems         See HPI - all other systems neg except as noted... The patient complains of decreased hearing, dyspnea on exertion, muscle weakness, and difficulty walking.  The patient denies anorexia, fever, weight  loss, weight gain, vision loss, hoarseness, chest pain, syncope, peripheral edema, prolonged cough, headaches, hemoptysis, abdominal pain, melena, hematochezia, severe indigestion/heartburn, hematuria, incontinence, suspicious skin lesions, transient blindness, depression, unusual weight change, abnormal bleeding, enlarged lymph nodes, and angioedema.     Objective:   Physical Exam     WD, WN, Chr ill appearing 81 y/o WF in NAD... GENERAL:  Alert & oriented; pleasant & cooperative... HEENT:  [place]/AT, EOM-full, EACs-clear, TMs-wnl, NOSE-clear, THROAT-clear & wnl. NECK:  Supple w/ fairROM; no JVD; normal carotid impulses w/o bruits; no thyromegaly or nodules palpated; no lymphadenopathy. CHEST:  Clear to P & A; without wheezes/ rales/ or rhonchi heard... HEART:  Regular Rhythm; without murmurs/ rubs/ or gallops detected... ABDOMEN:  Soft & nontender; normal bowel sounds; no organomegaly or masses palpated... EXT:  mod arthritic changes, walks w/ cane, +venous insuffic & incr 1-2+ edema., scattered varicose veins... NEURO:  CN's intact; motor testing normal; no focal deficits... DERM:   mild intertrig rash under breast, & onychomycosis of toenails...  RADIOLOGY DATA:  Reviewed in the EPIC EMR & discussed w/ the patient...  LABORATORY DATA:  Reviewed in the EPIC EMR & discussed w/ the patient...   Assessment & Plan:    Hx episodes of SOB>> see above, she has been resistent to trying any of the meds & treatment suggestions that we have given to her (I  know she would improve on Benzo rx)...   DYSPNEA>  Hx asthma, stable off Advair, on Proair prn; hx anxiety component on Klonopin but she is not using!  HBP>  Controlled on Amlod5 + Lasix20, K20/d; but she is not taking meds regularly & labs 4/16 w/ K=3.0; therefore incr K20Bid...  RBBB>  Aware & denies CP, palpit, ch in DOE, etc... she thinks that she is allergic to EKG electrodes; EKG 4./16 in ER showed NSR, rate72, PACs, LAD, RBBB  CHOL>  On diet alone & FLP looks reasonable;  We reviewed low chol, low fat diet...  DM>  BS= 101-135 & last A1c is 6.3;  on diet alone & we reviewed low carb no sweets etc...  GI> Indigestion, Divertics> she notes most bowel symptoms resolved off spicey foods;  UGI symptoms w/ dysphagia, food sticking, belch/gas/ etc have not resolved on Protonix40Bid and after GI consult w/ DrPerry; we reviewed her UGI series results and decided to proceed w/ MBS by Speech Path; transiently improved then sought 2nd opinion from DrJEdwards=> on Nexium40Bid...  UTI>  Klebsiella UTI resolved after Septra Rx... She has been eval by DrMacDiarmid.  DJD, LBP, Spinal Stenosis>  Prev evals by Ortho, DrRamos, DrNudelman etc; improved after THR w/ better ambulation; c/o neuropathic discomfort in legs- she will f/u w/ Ramos for shots, using OTC "natural" meds...  Anxiety>  If she would take the Klonopin, I feel it would help...   Patient's Medications  New Prescriptions   No medications on file  Previous Medications   ADVAIR DISKUS 100-50 MCG/DOSE AEPB    INHALE 1 PUFF INTO THE LUNGS EVERY 12 HOURS AS NEEDED.   ALPRAZOLAM (XANAX) 0.5 MG TABLET    Take 1 tablet (0.5 mg total) by mouth 3 (three) times daily as needed for anxiety.   ALUM & MAG HYDROXIDE-SIMETH (MAGIC MOUTHWASH W/LIDOCAINE) SOLN    Take 5 mLs by mouth 4 (four) times daily as needed for mouth pain.   ASPIRIN EC 81 MG TABLET    Take 81 mg by mouth daily.   BENZOCAINE, DENTAL, (ANBESOL MAXIMUM STRENGTH) 20 % LIQD  Place 1  application onto teeth daily as needed (gum irritation).   FERROUS SULFATE 325 (65 FE) MG TABLET    Take 325 mg by mouth daily with breakfast.   FLUTICASONE-SALMETEROL (ADVAIR) 100-50 MCG/DOSE AEPB    Inhale 1-2 puffs into the lungs daily.   FUROSEMIDE (LASIX) 20 MG TABLET    Take 1 tablet (20 mg total) by mouth daily.   MULTIPLE VITAMIN (MULTIVITAMIN WITH MINERALS) TABS TABLET    Take 1 tablet by mouth daily. Centrum Silver   NORVASC 5 MG TABLET    TAKE 1 TABLET ONCE DAILY.   OVER THE COUNTER MEDICATION    Take 1 application by mouth daily as needed (sinus congestion). Rawleigh's Salve - apply to nostrils   POLYETHYLENE GLYCOL (MIRALAX / GLYCOLAX) PACKET    Take 17 g by mouth daily as needed for mild constipation.    POTASSIUM CHLORIDE (K-DUR,KLOR-CON) 10 MEQ TABLET    Take 2 tablets (20 mEq total) by mouth daily.   PREDNISONE (DELTASONE) 10 MG TABLET    Take 5 mg by mouth See admin instructions. Take 1/2 tablet (5 mg) daily for 3 days for itching; hold for 10 days, then restart if needed for itching   PROAIR HFA 108 (90 BASE) MCG/ACT INHALER    INHALE 2 PUFFS EVERY 4 HOURS AS NEEDED   RANITIDINE (ZANTAC) 150 MG TABLET    Take 150 mg by mouth daily.   SPACER/AERO-HOLDING CHAMBERS (AEROCHAMBER PLUS WITH MASK) INHALER    Use as instructed  Modified Medications   No medications on file  Discontinued Medications   CLONAZEPAM (KLONOPIN) 0.5 MG TABLET    Take 0.5 tablets (0.25 mg total) by mouth 2 (two) times daily as needed for anxiety.   LORAZEPAM (ATIVAN) 0.5 MG TABLET    Take 0.5 tablets (0.25 mg total) by mouth every 8 (eight) hours as needed for anxiety.

## 2015-07-21 NOTE — Patient Instructions (Signed)
Today we updated your med list in our EPIC system...    Continue your current medications the same...  We discussed using the XANAX (Alprazolam) 0.5mg  tabs- 1/2 tab three times daily (breakfast, lunch, dinner) on a regular basis...  We will arrange for an overnight oximetry to see if you qualify for Oxygen at home per medicare criteria...  Call for any questions...  Let's plan a follow up visit in 69mo, sooner if needed for problems.Marland KitchenMarland Kitchen

## 2015-07-26 ENCOUNTER — Emergency Department (HOSPITAL_COMMUNITY): Payer: Medicare Other

## 2015-07-26 ENCOUNTER — Emergency Department (HOSPITAL_COMMUNITY)
Admission: EM | Admit: 2015-07-26 | Discharge: 2015-07-26 | Disposition: A | Discharge status: Home / Self Care | Payer: Medicare Other | Attending: Emergency Medicine | Admitting: Emergency Medicine

## 2015-07-26 ENCOUNTER — Encounter (HOSPITAL_COMMUNITY): Payer: Self-pay

## 2015-07-26 DIAGNOSIS — R0600 Dyspnea, unspecified: Secondary | ICD-10-CM | POA: Insufficient documentation

## 2015-07-26 DIAGNOSIS — Z8742 Personal history of other diseases of the female genital tract: Secondary | ICD-10-CM | POA: Diagnosis not present

## 2015-07-26 DIAGNOSIS — Z87448 Personal history of other diseases of urinary system: Secondary | ICD-10-CM | POA: Diagnosis not present

## 2015-07-26 DIAGNOSIS — Z79899 Other long term (current) drug therapy: Secondary | ICD-10-CM | POA: Insufficient documentation

## 2015-07-26 DIAGNOSIS — Z87440 Personal history of urinary (tract) infections: Secondary | ICD-10-CM | POA: Diagnosis not present

## 2015-07-26 DIAGNOSIS — R0602 Shortness of breath: Secondary | ICD-10-CM | POA: Diagnosis present

## 2015-07-26 DIAGNOSIS — D649 Anemia, unspecified: Secondary | ICD-10-CM | POA: Diagnosis not present

## 2015-07-26 DIAGNOSIS — Z7951 Long term (current) use of inhaled steroids: Secondary | ICD-10-CM | POA: Diagnosis not present

## 2015-07-26 DIAGNOSIS — Z8739 Personal history of other diseases of the musculoskeletal system and connective tissue: Secondary | ICD-10-CM | POA: Diagnosis not present

## 2015-07-26 DIAGNOSIS — Z7982 Long term (current) use of aspirin: Secondary | ICD-10-CM | POA: Insufficient documentation

## 2015-07-26 DIAGNOSIS — Z8639 Personal history of other endocrine, nutritional and metabolic disease: Secondary | ICD-10-CM | POA: Insufficient documentation

## 2015-07-26 DIAGNOSIS — Z91040 Latex allergy status: Secondary | ICD-10-CM | POA: Diagnosis not present

## 2015-07-26 DIAGNOSIS — Z7952 Long term (current) use of systemic steroids: Secondary | ICD-10-CM | POA: Diagnosis not present

## 2015-07-26 DIAGNOSIS — K219 Gastro-esophageal reflux disease without esophagitis: Secondary | ICD-10-CM | POA: Diagnosis not present

## 2015-07-26 DIAGNOSIS — I10 Essential (primary) hypertension: Secondary | ICD-10-CM | POA: Insufficient documentation

## 2015-07-26 LAB — COMPREHENSIVE METABOLIC PANEL
ALBUMIN: 3.6 g/dL (ref 3.5–5.0)
ALK PHOS: 56 U/L (ref 38–126)
ALT: 17 U/L (ref 14–54)
ANION GAP: 9 (ref 5–15)
AST: 25 U/L (ref 15–41)
BUN: 10 mg/dL (ref 6–20)
CALCIUM: 9.6 mg/dL (ref 8.9–10.3)
CO2: 31 mmol/L (ref 22–32)
Chloride: 96 mmol/L — ABNORMAL LOW (ref 101–111)
Creatinine, Ser: 1.13 mg/dL — ABNORMAL HIGH (ref 0.44–1.00)
GFR calc Af Amer: 48 mL/min — ABNORMAL LOW (ref >60)
GFR, EST NON AFRICAN AMERICAN: 41 mL/min — AB (ref >60)
GLUCOSE: 108 mg/dL — AB (ref 65–99)
POTASSIUM: 4 mmol/L (ref 3.5–5.1)
Sodium: 136 mmol/L (ref 135–145)
TOTAL PROTEIN: 6.2 g/dL — AB (ref 6.5–8.1)
Total Bilirubin: 0.4 mg/dL (ref 0.3–1.2)

## 2015-07-26 NOTE — ED Provider Notes (Signed)
CSN: [id number]     Arrival date & time 07/26/15  1756 History   First MD Initiated Contact with Patient 07/26/15 1759     Chief Complaint  Patient presents with  . Shortness of Breath     (Consider location/radiation/quality/duration/timing/severity/associated sxs/prior Treatment) Patient is a 81 y.o. female presenting with shortness of breath. The history is provided by a relative.  Shortness of Breath Severity:  Mild Onset quality:  Unable to specify Timing:  Unable to specify Progression:  Unable to specify Chronicity:  Chronic Context: not activity, not known allergens and not pollens   Relieved by:  None tried Worsened by:  Nothing tried Ineffective treatments:  None tried Associated symptoms: no abdominal pain, no hemoptysis and no neck pain   Risk factors: no recent alcohol use     Past Medical History  Diagnosis Date  . Shortness of breath   . Unspecified essential hypertension   . Right bundle branch block   . Other and unspecified hyperlipidemia   . Other abnormal glucose   . Diverticulosis of colon (without mention of hemorrhage)   . Pyelonephritis, unspecified   . Solitary cyst of breast   . Osteoarthrosis, unspecified whether generalized or localized, unspecified site   . Lumbago   . Spinal stenosis, unspecified region other than cervical   . Anemia, unspecified   . GERD (gastroesophageal reflux disease)   . Pyelonephritis   . DJD (degenerative joint disease)   . UTI (lower urinary tract infection)    Past Surgical History  Procedure Laterality Date  . Cataract extraction    . Right hip hemiarthroplasty      total  . Conversion to right thr     Family History  Problem Relation Age of Onset  . Cancer Brother   . Cancer Brother    Social History  Substance Use Topics  . Smoking status: Never Smoker   . Smokeless tobacco: Never Used  . Alcohol Use: No   OB History    No data available     Review of Systems  Constitutional: Negative for  fatigue.  Respiratory: Positive for shortness of breath. Negative for hemoptysis.   Gastrointestinal: Negative for abdominal pain.  Musculoskeletal: Negative for neck pain.  All other systems reviewed and are negative.     Allergies  Azithromycin; Ciprofloxacin; Levofloxacin; Latex; and Other  Home Medications   Prior to Admission medications   Medication Sig Start Date End Date Taking? Authorizing Provider  ALPRAZolam Prudy Feeler) 0.5 MG tablet Take 1 tablet (0.5 mg total) by mouth 3 (three) times daily as needed for anxiety. 07/06/15  Yes Michele Mcalpine, MD  Alum & Mag Hydroxide-Simeth (MAGIC MOUTHWASH W/LIDOCAINE) SOLN Take 5 mLs by mouth 4 (four) times daily as needed for mouth pain. Patient taking differently: Take 5 mLs by mouth daily as needed for mouth pain. Swish and swallow 01/06/15  Yes Michele Mcalpine, MD  aspirin EC 81 MG tablet Take 81 mg by mouth daily.   Yes Historical Provider, MD  BENZOCAINE, DENTAL, (ANBESOL MAXIMUM STRENGTH) 20 % LIQD Place 1 application onto teeth daily as needed (gum irritation).   Yes Historical Provider, MD  ferrous sulfate 325 (65 FE) MG tablet Take 325 mg by mouth daily with breakfast.   Yes Historical Provider, MD  Fluticasone-Salmeterol (ADVAIR) 100-50 MCG/DOSE AEPB Inhale 1-2 puffs into the lungs daily.   Yes Historical Provider, MD  furosemide (LASIX) 20 MG tablet Take 1 tablet (20 mg total) by mouth daily. Patient taking differently:  Take 20 mg by mouth See admin instructions. Take 1 tablet (20 mg) by mouth every morning, may take an additional tablet if needed for ankle swelling 01/26/15  Yes Michele Mcalpine, MD  magnesium hydroxide (MILK OF MAGNESIA) 800 MG/5ML suspension Take 30 mLs by mouth daily as needed for constipation.   Yes Historical Provider, MD  Multiple Vitamin (MULTIVITAMIN WITH MINERALS) TABS tablet Take 1 tablet by mouth daily. Centrum Silver   Yes Historical Provider, MD  NORVASC 5 MG tablet TAKE 1 TABLET ONCE DAILY. 12/24/14  Yes Michele Mcalpine, MD  OVER THE COUNTER MEDICATION Take 1 application by mouth daily as needed (sinus congestion). Rawleigh's Salve - apply to nostrils   Yes Historical Provider, MD  polyethylene glycol (MIRALAX / GLYCOLAX) packet Take 17 g by mouth daily as needed for mild constipation.    Yes Historical Provider, MD  potassium chloride (K-DUR,KLOR-CON) 10 MEQ tablet Take 2 tablets (20 mEq total) by mouth daily. 03/22/15  Yes Michele Mcalpine, MD  predniSONE (DELTASONE) 10 MG tablet Take 5 mg by mouth See admin instructions. Take 1/2 tablet (5 mg) daily for 3 days for itching; hold for 10 days, then restart if needed for itching rash on back 05/21/15  Yes Historical Provider, MD  PROAIR HFA 108 (90 BASE) MCG/ACT inhaler INHALE 2 PUFFS EVERY 4 HOURS AS NEEDED 06/13/15  Yes Michele Mcalpine, MD  ranitidine (ZANTAC) 150 MG tablet Take 150 mg by mouth daily.   Yes Historical Provider, MD  ADVAIR DISKUS 100-50 MCG/DOSE AEPB INHALE 1 PUFF INTO THE LUNGS EVERY 12 HOURS AS NEEDED. Patient not taking: Reported on 07/26/2015 06/13/15   Michele Mcalpine, MD  Spacer/Aero-Holding Chambers (AEROCHAMBER PLUS WITH MASK) inhaler Use as instructed Patient not taking: Reported on 07/21/2015 06/12/15   Arby Barrette, MD   BP 172/57 mmHg  Pulse 66  Temp(Src) 97.8 F (36.6 C) (Oral)  Resp 24  SpO2 95% Physical Exam  Constitutional: She is oriented to person, place, and time. She appears well-developed and well-nourished.  HENT:  Head: Normocephalic and atraumatic.  Eyes: Conjunctivae and EOM are normal. Right eye exhibits no discharge. Left eye exhibits no discharge.  Cardiovascular: Normal rate and regular rhythm.   Pulmonary/Chest: Effort normal and breath sounds normal. No respiratory distress.  Abdominal: Soft. She exhibits no distension. There is no tenderness. There is no rebound.  Musculoskeletal: Normal range of motion. She exhibits no edema or tenderness.  Neurological: She is alert and oriented to person, place, and time.  Skin:  Skin is warm and dry.  Nursing note and vitals reviewed.   ED Course  Procedures (including critical care time) Labs Review Labs Reviewed  COMPREHENSIVE METABOLIC PANEL - Abnormal; Notable for the following:    Chloride 96 (*)    Glucose, Bld 108 (*)    Creatinine, Ser 1.13 (*)    Total Protein 6.2 (*)    GFR calc non Af Amer 41 (*)    GFR calc Af Amer 48 (*)    All other components within normal limits    Imaging Review Dg Chest 2 View  07/26/2015   CLINICAL DATA:  80 year old female with shortness of breath  EXAM: CHEST  2 VIEW  COMPARISON:  Radiograph dated 07/15/2015  FINDINGS: Two views of the chest demonstrate emphysematous changes. No focal consolidations, pleural effusion, or pneumothorax. Stable cardiac silhouette. The osseous structures are grossly unremarkable.  IMPRESSION: No active cardiopulmonary disease.   Electronically Signed   By: Burtis Junes  Radparvar M.D.   On: 07/26/2015 20:23   I have personally reviewed and evaluated these images and lab results as part of my medical decision-making.   EKG Interpretation   Date/Time:  Tuesday July 26 2015 18:02:00 EDT Ventricular Rate:  78 PR Interval:  268 QRS Duration: 128 QT Interval:  411 QTC Calculation: 468 R Axis:   -115 Text Interpretation:  Sinus or ectopic atrial rhythm Prolonged PR interval  Probable left atrial enlargement IVCD, consider atypical RBBB Inferior  infarct, old Probable lateral infarct, age indeterminate Confirmed by  [facility] MD, Nahiara Kretzschmar [phone]) on 07/26/2015 6:45:29 PM      MDM   Final diagnoses:  SOB (shortness of breath)   Patient seen here multiple times for the same thing. We'll say that she has shortness of breath so that her daughter will come spend time with her. Lasted R driver around and then asked the daughter to bring her here. Patient has intermittent dyspnea at baseline has inhalers at home. Daughter feels that the patient's baseline right now. Lungs are clear to auscultation  without wheezes or rhonchi. Evidence of acidosis on her labs. Doubt ACS, PE or pneumonia. Patient likely needs to be admitted to an assisted living facility which the daughters currently working on.  I have personally and contemperaneously reviewed labs and imaging and used in my decision making as above.   A medical screening exam was performed and I feel the patient has had an appropriate workup for their chief complaint at this time and likelihood of emergent condition existing is low. They have been counseled on decision, discharge, follow up and which symptoms necessitate immediate return to the emergency department. They or their family verbally stated understanding and agreement with plan and discharged in stable condition.      Marily Memos, MD 07/27/15 [phone]

## 2015-07-26 NOTE — ED Notes (Signed)
MD Mesner at the bedside  

## 2015-07-26 NOTE — ED Notes (Signed)
Per EMS, Patient was left alone this morning when her daughter had to take care of another family member. Patient had an episode of anxiety. Patient has inhaler and has taken 5 puffs for Shortness of Breath with no relief. Patient reported one episode of chest pain before EMS arrived. Patient denied chest pain or SOB when she was placed in the back of the ambulance. Patient is alert and oriented x4 upon arrival. Denies chest pain and complains of "a little" SOB. Patient reports that "her daughter wants to put her in a nursing home and wants to get rid of me." Vitals per EMS: 130/90, 78 HR, 24 RR. Patient had HX of anxiety, COPD, CHF. Patient has chronic bilateral edema in her lower extremities.

## 2015-07-26 NOTE — ED Notes (Signed)
Patient returned from X-ray 

## 2015-07-27 ENCOUNTER — Telehealth: Payer: Self-pay | Admitting: Pulmonary Disease

## 2015-07-27 NOTE — Telephone Encounter (Signed)
Called and spoke with pt's daughter Daughter stated that she took pt to ER last night due to increased SOB related to possible anxiety attack Daughter stated that she gave pt a whole xanax when they got home from ER and pt is now asleep  Daughter is concerned also about the constant belching pt is doing lately Daughter states that pt is taking xantac everyday without relief She wanted to know if SN had any further rec of what to do for indigestion  SN, please advise with any new rec  Allergies  Allergen Reactions  . Azithromycin Shortness Of Breath  . Ciprofloxacin Other (See Comments)     hallucinations  . Levofloxacin Other (See Comments)    Insomnia, indigestion, tingling sensation in legs  . Latex Rash  . Other Rash    EKG leads caused a rash that required steroids to clear

## 2015-08-01 NOTE — Telephone Encounter (Signed)
Per SN, Inform daughter for pt to begin OTC prilosec BID to see if it helps with indigestion Prepare [facility] for assisted living and contact daughter once completed  Called and informed daughter of the above Daughter stated that pt is no longer belching at this time and it has seemed to resolve it self Informed daughter that i was still working on paperwork and would contact her once completed  Message will be left open until paperwork is received

## 2015-08-01 NOTE — Telephone Encounter (Signed)
Fleet Contras, has this been taken care of? Please advise.

## 2015-08-01 NOTE — Telephone Encounter (Signed)
Patient's daughter calling about labs that were ordered in July.  Orders are still in system.  Patients daughter notified that orders were still marked as future and she can come by the lab anytime and have those drawn.  Patient's daughter says she will bring her mom to the lab to have them drawn soon.  Sending message back to Fleet Contras to follow up on FL-2 forms.

## 2015-08-01 NOTE — Telephone Encounter (Signed)
Pt daughter calling back with another question and can be reached @ [phone].[name]

## 2015-08-03 NOTE — Telephone Encounter (Signed)
Fleet Contras, please advise on forms. Thanks.

## 2015-08-04 NOTE — Telephone Encounter (Signed)
Paperwork completed and placed on SN cart for review

## 2015-08-04 NOTE — Telephone Encounter (Signed)
Paperwork signed by SN Daughter called and notified that paperwork is ready Daughter stated that she would come by office to pick up  Paperwork placed up front for pick up Nothing further is needed

## 2015-08-08 ENCOUNTER — Telehealth: Payer: Self-pay | Admitting: Pulmonary Disease

## 2015-08-08 NOTE — Telephone Encounter (Signed)
Spoke with pt's daughter, Talbert Forest. Talbert Forest is needing to know when was the pt's last TB vaccine. Advised her that we do not have one listed in the pt's chart. Appointment has been scheduled for pt to come in tomorrow for TB vaccine. Nothing further was needed at this time.

## 2015-08-08 NOTE — Telephone Encounter (Signed)
Pt daughter returning call.Stanley A Dalton ° °

## 2015-08-08 NOTE — Telephone Encounter (Signed)
lmtcb

## 2015-08-09 ENCOUNTER — Ambulatory Visit: Payer: Medicare Other | Admitting: Pulmonary Disease

## 2015-08-09 ENCOUNTER — Other Ambulatory Visit (INDEPENDENT_AMBULATORY_CARE_PROVIDER_SITE_OTHER): Payer: Medicare Other

## 2015-08-09 ENCOUNTER — Ambulatory Visit (INDEPENDENT_AMBULATORY_CARE_PROVIDER_SITE_OTHER): Payer: Medicare Other

## 2015-08-09 DIAGNOSIS — I10 Essential (primary) hypertension: Secondary | ICD-10-CM | POA: Diagnosis not present

## 2015-08-09 DIAGNOSIS — M159 Polyosteoarthritis, unspecified: Secondary | ICD-10-CM

## 2015-08-09 DIAGNOSIS — M150 Primary generalized (osteo)arthritis: Secondary | ICD-10-CM | POA: Diagnosis not present

## 2015-08-09 DIAGNOSIS — D649 Anemia, unspecified: Secondary | ICD-10-CM | POA: Diagnosis not present

## 2015-08-09 DIAGNOSIS — R0600 Dyspnea, unspecified: Secondary | ICD-10-CM | POA: Diagnosis not present

## 2015-08-09 DIAGNOSIS — K219 Gastro-esophageal reflux disease without esophagitis: Secondary | ICD-10-CM

## 2015-08-09 DIAGNOSIS — Z23 Encounter for immunization: Secondary | ICD-10-CM | POA: Diagnosis not present

## 2015-08-09 DIAGNOSIS — B351 Tinea unguium: Secondary | ICD-10-CM

## 2015-08-09 DIAGNOSIS — F419 Anxiety disorder, unspecified: Secondary | ICD-10-CM | POA: Diagnosis not present

## 2015-08-09 DIAGNOSIS — E538 Deficiency of other specified B group vitamins: Secondary | ICD-10-CM

## 2015-08-09 DIAGNOSIS — J438 Other emphysema: Secondary | ICD-10-CM | POA: Diagnosis not present

## 2015-08-09 DIAGNOSIS — Z111 Encounter for screening for respiratory tuberculosis: Secondary | ICD-10-CM

## 2015-08-09 DIAGNOSIS — R5382 Chronic fatigue, unspecified: Secondary | ICD-10-CM

## 2015-08-09 DIAGNOSIS — M810 Age-related osteoporosis without current pathological fracture: Secondary | ICD-10-CM

## 2015-08-09 LAB — CBC WITH DIFFERENTIAL/PLATELET
BASOS PCT: 0.9 % (ref 0.0–3.0)
Basophils Absolute: 0.1 10*3/uL (ref 0.0–0.1)
EOS PCT: 2.8 % (ref 0.0–5.0)
Eosinophils Absolute: 0.2 10*3/uL (ref 0.0–0.7)
HCT: 43.8 % (ref 36.0–46.0)
HEMOGLOBIN: 14.7 g/dL (ref 12.0–15.0)
Lymphocytes Relative: 33.5 % (ref 12.0–46.0)
Lymphs Abs: 2 10*3/uL (ref 0.7–4.0)
MCHC: 33.5 g/dL (ref 30.0–36.0)
MCV: 89.9 fl (ref 78.0–100.0)
MONO ABS: 0.6 10*3/uL (ref 0.1–1.0)
MONOS PCT: 10.7 % (ref 3.0–12.0)
Neutro Abs: 3.1 10*3/uL (ref 1.4–7.7)
Neutrophils Relative %: 52.1 % (ref 43.0–77.0)
Platelets: 187 10*3/uL (ref 150.0–400.0)
RBC: 4.88 Mil/uL (ref 3.87–5.11)
RDW: 13.9 % (ref 11.5–15.5)
WBC: 6 10*3/uL (ref 4.0–10.5)

## 2015-08-09 LAB — BASIC METABOLIC PANEL
BUN: 14 mg/dL (ref 6–23)
CO2: 32 mEq/L (ref 19–32)
CREATININE: 1.03 mg/dL (ref 0.40–1.20)
Calcium: 9.8 mg/dL (ref 8.4–10.5)
Chloride: 100 mEq/L (ref 96–112)
GFR: 53.44 mL/min — AB (ref >60.00)
Glucose, Bld: 94 mg/dL (ref 70–99)
Potassium: 3.9 mEq/L (ref 3.5–5.1)
Sodium: 139 mEq/L (ref 135–145)

## 2015-08-09 LAB — IBC PANEL
Iron: 150 ug/dL — ABNORMAL HIGH (ref 42–145)
SATURATION RATIOS: 51.8 % — AB (ref 20.0–50.0)
Transferrin: 207 mg/dL — ABNORMAL LOW (ref 212.0–360.0)

## 2015-08-09 LAB — URINALYSIS, ROUTINE W REFLEX MICROSCOPIC
Bilirubin Urine: NEGATIVE
Hgb urine dipstick: NEGATIVE
Ketones, ur: NEGATIVE
Leukocytes, UA: NEGATIVE
NITRITE: NEGATIVE
TOTAL PROTEIN, URINE-UPE24: NEGATIVE
URINE GLUCOSE: NEGATIVE
Urobilinogen, UA: 0.2 (ref 0.0–1.0)
pH: 5.5 (ref 5.0–8.0)

## 2015-08-09 LAB — VITAMIN B12: VITAMIN B 12: 392 pg/mL (ref 211–911)

## 2015-08-09 LAB — VITAMIN D 25 HYDROXY (VIT D DEFICIENCY, FRACTURES): VITD: 32.97 ng/mL (ref 30.00–100.00)

## 2015-08-09 LAB — FERRITIN: FERRITIN: 77.2 ng/mL (ref 10.0–291.0)

## 2015-08-09 LAB — BRAIN NATRIURETIC PEPTIDE: PRO B NATRI PEPTIDE: 72 pg/mL (ref 0.0–100.0)

## 2015-08-11 ENCOUNTER — Telehealth: Payer: Self-pay | Admitting: Pulmonary Disease

## 2015-08-11 LAB — TB SKIN TEST
Induration: 0 mm
TB Skin Test: NEGATIVE

## 2015-08-11 NOTE — Telephone Encounter (Signed)
Pt came in for TB to be read. It was negative. Also requesting lab results. Please advise thanks

## 2015-08-12 ENCOUNTER — Telehealth: Payer: Self-pay | Admitting: Pulmonary Disease

## 2015-08-12 NOTE — Telephone Encounter (Signed)
Received call from [facility].. Needs documentation of TB read (is faxing over form to be completed by Dr. Kriste Basque); also, faxing over a Standing Order form to be completed by Dr. Kriste Basque.  Eunice Blase is also requesting a HARD SCRIPT for the Alprazolam.  She wants to pick up the script on Monday if possible.    To Fleet Contras for follow up

## 2015-08-15 MED ORDER — ALPRAZOLAM 0.5 MG PO TABS: 0.5000 mg | ORAL_TABLET | Freq: Three times a day (TID) | ORAL | Status: DC

## 2015-08-15 NOTE — Telephone Encounter (Signed)
Were these forms received and completed. Thanks.

## 2015-08-15 NOTE — Telephone Encounter (Signed)
SN please advise on lab results. Thanks!

## 2015-08-15 NOTE — Telephone Encounter (Signed)
[name] is calling to check the status of this. [phone] She needs the hard script for alprazolom. Also needs the other standing order and TB study. The forms she faxed to [place] on fiday. Fax back to [phone] It is crucial that she receives this soon.

## 2015-08-15 NOTE — Telephone Encounter (Signed)
Pt's daughter informed of lab results Voiced understanding of rec  Nothing further is needed

## 2015-08-15 NOTE — Telephone Encounter (Signed)
Forms received and signed by SN Hermina Staggers and informed that forms were ready to be picked up. Forms placed up front for [facility] to pick up  Nothing further is needed

## 2015-08-16 ENCOUNTER — Telehealth: Payer: Self-pay | Admitting: Pulmonary Disease

## 2015-08-16 MED ORDER — VITAMIN D3 50 MCG (2000 UT) PO CAPS: 2000.0000 [IU] | ORAL_CAPSULE | Freq: Every day | ORAL | Status: DC

## 2015-08-16 NOTE — Telephone Encounter (Signed)
Left message for Guss Bunde to call back

## 2015-08-16 NOTE — Telephone Encounter (Signed)
Pt daughter has another question about a few other med please advise, she can be reached @ [phone].[name]

## 2015-08-16 NOTE — Telephone Encounter (Signed)
Spoke with pt's daughter.  She states that when they were called about lab results pt was to stop iron tab and begin Vitamin D daily.  Pt is now at [facility] and they need an order stating the changes to their med list including strength and frequency of  Vitamin D.  Please advise

## 2015-08-16 NOTE — Telephone Encounter (Signed)
Called and spoke with daughter Daughter wanted to know if order had been sent to [facility] for her mother to start vit d supp Informed daughter that i had just faxed order to [facility] and called to inform it was received  Nothing further is needed at this time

## 2015-08-17 ENCOUNTER — Telehealth: Payer: Self-pay | Admitting: Pulmonary Disease

## 2015-08-17 MED ORDER — FLUTICASONE-SALMETEROL 100-50 MCG/DOSE IN AEPB: 1.0000 | INHALATION_SPRAY | Freq: Two times a day (BID) | RESPIRATORY_TRACT | Status: DC

## 2015-08-17 NOTE — Telephone Encounter (Signed)
Return call from [facility] calling saying thank you for call on yesterday said she got all the info she needed any questions give her a call back @ [phone].[name]

## 2015-08-17 NOTE — Telephone Encounter (Signed)
Called Debbie and lmtcb on VM

## 2015-08-17 NOTE — Telephone Encounter (Signed)
New advair RX has been ordered. Nothing further needed

## 2015-08-19 ENCOUNTER — Telehealth: Payer: Self-pay | Admitting: Pulmonary Disease

## 2015-08-19 NOTE — Telephone Encounter (Signed)
Called and spoke with pt's daughter Daughter states that when pt was at home she was not taking the xanax TID like they are giving it to her at ALF Informed daughter that per [facility], RX could not be PRN, it had to have set times on it Daughter requested that xanax be decreased to only once daily because it is making pt very sleepy  Informed daughter that SN will not be back in office until Monday and I would address this with him at that time Daughter ok with this  Dr Kriste Basque, please advise if you wold like to decrease pt's xanax. Thanks   Allergies  Allergen Reactions  . Azithromycin Shortness Of Breath  . Ciprofloxacin Other (See Comments)     hallucinations  . Levofloxacin Other (See Comments)    Insomnia, indigestion, tingling sensation in legs  . Latex Rash  . Other Rash    EKG leads caused a rash that required steroids to clear

## 2015-08-23 ENCOUNTER — Telehealth: Payer: Self-pay | Admitting: Pulmonary Disease

## 2015-08-23 MED ORDER — ALPRAZOLAM 0.25 MG PO TABS: ORAL_TABLET | ORAL | Status: DC

## 2015-08-23 NOTE — Telephone Encounter (Signed)
[facility] returning call [name] and she can be reached @ [phone].[name]

## 2015-08-23 NOTE — Telephone Encounter (Signed)
Per SN, Ok to decrease pt's dose of xanax to 0.25mg  with instructions to take 1/2 tablet PO Q day since family feels that she is too sedated Call [facility] and see if they can take verbal order or if we need to fax. Also ask if it would be better for their physicians to overtake rx concern.  Called and left message Guss Bunde with [facility] to call office back to go over the above  Waiting on return phone call

## 2015-08-23 NOTE — Telephone Encounter (Signed)
Spoke with Burna Mortimer at [facility]. States that she needs written order. This has been faxed to her. Nothing further was needed.

## 2015-08-23 NOTE — Telephone Encounter (Signed)
SN please advise. Thanks.  

## 2015-08-23 NOTE — Telephone Encounter (Signed)
Spoke with [employer]. Made him aware that SN did in fact decrease pt's xanax dosage. Nothing further was needed.

## 2015-08-24 ENCOUNTER — Telehealth: Payer: Self-pay | Admitting: Pulmonary Disease

## 2015-08-24 MED ORDER — ALPRAZOLAM 0.25 MG PO TABS: ORAL_TABLET | ORAL | Status: DC

## 2015-08-24 MED ORDER — SERTRALINE HCL 50 MG PO TABS: 50.0000 mg | ORAL_TABLET | Freq: Every day | ORAL | Status: DC

## 2015-08-24 NOTE — Telephone Encounter (Signed)
Called and spoke with pt's daughter and informed of medication changes Daughter ok with changes  Orders printed and signed by SN Called [facility] for fax number to facility Was transferred to [employer] Left message for her to call back so that we can get facility fax #  Will hold signed rx until return call

## 2015-08-24 NOTE — Telephone Encounter (Signed)
Pt daughter returning call and can be reached @ [phone] going to clean out vm.[name]

## 2015-08-24 NOTE — Telephone Encounter (Signed)
Per SN: Increase Xanax to .  BID Start Zoloft  QHS  Attempted to call Talbert Forest, voicemail was full, could not leave message, will call back.

## 2015-08-24 NOTE — Telephone Encounter (Signed)
Received call back from [facility] Fax # is [phone]  RX changes faxed to the # above  Nothing further is needed

## 2015-08-24 NOTE — Telephone Encounter (Signed)
Spoke with the pt's daughter, Talbert Forest  She states that the pt took the 0.25mg  xanax 1/2 tab yesterday-we decreased this per family's request due to "too sedated" She states that she had "one of her little attacks" last night- crying and feeling anxious  She is still crying this am, and says she wants to go home from nursing home  Daughter feels that she would would do better with increasing the xanax back up to 0.25 mg bid  She also thinks that the pt would benefit from an antidepressant such as prozac  SN, please advise thanks!

## 2015-08-25 ENCOUNTER — Telehealth: Payer: Self-pay | Admitting: Pulmonary Disease

## 2015-08-25 NOTE — Telephone Encounter (Signed)
Called [facility] and asked to speak with the med tech working with patient today Was on hold for 6 min Will call back

## 2015-08-25 NOTE — Telephone Encounter (Signed)
LMOM TCB x1 for [employer]

## 2015-08-25 NOTE — Telephone Encounter (Signed)
Per pt's chart, the Xanax was decreased on 10.4.16 to 0.25mg  QD because the family felt that pt was too sedated Then that evening, apparently pt had 'one of her attacks' and the daughter called on 10.5.16 to ask if the dose can be increased back to 0.25mg  BID with the addition of an antidepressant (which SN okayed)  Called spoke with Steward Drone at [facility] in [place] and discussed the above with her Steward Drone voiced her understanding and denied any further questions/concerns  Nothing further needed; will sign off.

## 2015-08-25 NOTE — Telephone Encounter (Signed)
Debbie returned call, [phone].  She said to call back and ask for med tech (Eunice Blase is marketing).

## 2015-08-26 NOTE — Telephone Encounter (Signed)
Called and spoke to [place]. Per Eunice Blase and our documentation nothing further is needed. Debbie stated Sherrin Daisy was over the pt's case.   Fleet Contras, is there anything further needed that you are aware of? Thanks.

## 2015-08-31 ENCOUNTER — Telehealth: Payer: Self-pay | Admitting: Pulmonary Disease

## 2015-08-31 NOTE — Telephone Encounter (Signed)
Per SN, Instruct pt to stop taking lasix and begin 2 gram/day salt diet Inform pt and daughter to hold on any other diuretics for now  Called and spoke with daughter, [name] and informed of the above Daughter requested that d/c and diet order be faxed to [facility]  Informed daughter that if pt begins to have edema, the office needs to be contacted   Order faxed to [facility] at [phone]  Nothing further is needed

## 2015-08-31 NOTE — Telephone Encounter (Signed)
Patient has rash coming back.  She is concerned that Lasix may be causing this rash.  She said that she spoke with another lady who had this same rash and they told her that her rash was caused by Lasix, when this woman stopped taking the Lasix, the rash went away.  She has 2 new places on her legs starting now.  Wants to know if they can change Lasix to HCTZ.  Daughter said that when she was on the HCTZ she did not have the rash.  She said to also let Dr. Kriste BasqueNadel know that her mom is adjusting well to Assisted living.

## 2015-09-09 ENCOUNTER — Telehealth: Payer: Self-pay | Admitting: Pulmonary Disease

## 2015-09-09 MED ORDER — DIPHENHYDRAMINE-APAP (SLEEP) 25-500 MG PO TABS: 1.0000 | ORAL_TABLET | Freq: Every evening | ORAL | Status: DC | PRN

## 2015-09-09 NOTE — Telephone Encounter (Signed)
Per SN, Not rec Niacin d/t adverse effects like skin flushing Ok for pt to take tylenol PM 1 tablet QHS PRN for sleep  Order for new medication faxed to pt's ALF  Called and spoke to daughter and informed of SN rec Pt's daughter voiced understanding  Nothing further is needed

## 2015-09-09 NOTE — Telephone Encounter (Signed)
Called and spoke to pt's daughter, [name]. [name] is requesting Dr. Jodelle GreenNadel's opinion if pt should start taking Niacin and recs for helping sleep. Pt is taking xanax and zoloft and is able to fall asleep but is unable to stay asleep throughout the night. [name] states she hear that Tylenol PM may help, she doesn't want pt taking something too heavy.   Dr. Kriste BasqueNadel please advise on recs, if there are any meds that are added (even OTC) she will need an rx at nursing facility.   Allergies  Allergen Reactions  . Azithromycin Shortness Of Breath  . Ciprofloxacin Other (See Comments)     hallucinations  . Levofloxacin Other (See Comments)    Insomnia, indigestion, tingling sensation in legs  . Latex Rash  . Other Rash    EKG leads caused a rash that required steroids to clear    Current Outpatient Prescriptions on File Prior to Visit  Medication Sig Dispense Refill  . ALPRAZolam (XANAX) 0.25 MG tablet Take 1 tablet by mouth twice daily 60 tablet 0  . Alum & Mag Hydroxide-Simeth (MAGIC MOUTHWASH W/LIDOCAINE) SOLN Take 5 mLs by mouth 4 (four) times daily as needed for mouth pain. (Patient taking differently: Take 5 mLs by mouth daily as needed for mouth pain. Swish and swallow) 240 mL 5  . aspirin EC 81 MG tablet Take 81 mg by mouth daily.    [name]. BENZOCAINE, DENTAL, (ANBESOL MAXIMUM STRENGTH) 20 % LIQD Place 1 application onto teeth daily as needed (gum irritation).    . Cholecalciferol (VITAMIN D3) 2000 UNITS capsule Take 1 capsule (2,000 Units total) by mouth daily. 30 capsule 6  . ferrous sulfate 325 (65 FE) MG tablet Take 325 mg by mouth daily with breakfast.    . Fluticasone-Salmeterol (ADVAIR DISKUS) 100-50 MCG/DOSE AEPB Inhale 1 puff into the lungs 2 (two) times daily. 60 each 6  . Fluticasone-Salmeterol (ADVAIR) 100-50 MCG/DOSE AEPB Inhale 1-2 puffs into the lungs daily.    . furosemide (LASIX) 20 MG tablet Take 1 tablet (20 mg total) by mouth daily. (Patient taking differently: Take 20 mg by  mouth See admin instructions. Take 1 tablet (20 mg) by mouth every morning, may take an additional tablet if needed for ankle swelling) 30 tablet 6  . magnesium hydroxide (MILK OF MAGNESIA) 800 MG/5ML suspension Take 30 mLs by mouth daily as needed for constipation.    . Multiple Vitamin (MULTIVITAMIN WITH MINERALS) TABS tablet Take 1 tablet by mouth daily. Centrum Silver    . NORVASC 5 MG tablet TAKE 1 TABLET ONCE DAILY. 30 tablet 6  . OVER THE COUNTER MEDICATION Take 1 application by mouth daily as needed (sinus congestion). Rawleigh's Salve - apply to nostrils    . polyethylene glycol (MIRALAX / GLYCOLAX) packet Take 17 g by mouth daily as needed for mild constipation.     . potassium chloride (K-DUR,KLOR-CON) 10 MEQ tablet Take 2 tablets (20 mEq total) by mouth daily. 30 tablet 1  . predniSONE (DELTASONE) 10 MG tablet Take 5 mg by mouth See admin instructions. Take 1/2 tablet (5 mg) daily for 3 days for itching; hold for 10 days, then restart if needed for itching rash on back    . PROAIR HFA 108 (90 BASE) MCG/ACT inhaler INHALE 2 PUFFS EVERY 4 HOURS AS NEEDED 8.5 g 0  . ranitidine (ZANTAC) 150 MG tablet Take 150 mg by mouth daily.    . sertraline (ZOLOFT) 50 MG tablet Take 1 tablet (50 mg total) by mouth at  bedtime. 30 tablet 0  . Spacer/Aero-Holding Chambers (AEROCHAMBER PLUS WITH MASK) inhaler Use as instructed (Patient not taking: Reported on 07/21/2015) 1 each 2   No current facility-administered medications on file prior to visit.

## 2015-09-16 ENCOUNTER — Telehealth: Payer: Self-pay | Admitting: Pulmonary Disease

## 2015-09-16 NOTE — Telephone Encounter (Signed)
lmtcb for [name]

## 2015-09-19 NOTE — Telephone Encounter (Signed)
Forms signed and faxed back to [facility]. Received conformation fax stating it went through  Nothing further is needed

## 2015-09-19 NOTE — Telephone Encounter (Signed)
Forms received this morning and placed on SN cart for review and signature

## 2015-09-19 NOTE — Telephone Encounter (Signed)
Were these forms received? If so, did SN sign them?

## 2015-09-23 ENCOUNTER — Telehealth: Payer: Self-pay | Admitting: Pulmonary Disease

## 2015-09-23 ENCOUNTER — Other Ambulatory Visit: Payer: Self-pay | Admitting: Pulmonary Disease

## 2015-09-23 NOTE — Telephone Encounter (Signed)
Forms refaxed to [phone]  Received confirmation fax  Nothing further is needed

## 2015-10-20 ENCOUNTER — Encounter: Payer: Self-pay | Admitting: Podiatry

## 2015-10-20 ENCOUNTER — Ambulatory Visit (INDEPENDENT_AMBULATORY_CARE_PROVIDER_SITE_OTHER): Payer: Medicare Other | Admitting: Podiatry

## 2015-10-20 VITALS — BP 147/86 | HR 70 | Resp 14

## 2015-10-20 DIAGNOSIS — M79674 Pain in right toe(s): Secondary | ICD-10-CM | POA: Diagnosis not present

## 2015-10-20 DIAGNOSIS — M79676 Pain in unspecified toe(s): Secondary | ICD-10-CM | POA: Diagnosis not present

## 2015-10-20 DIAGNOSIS — B351 Tinea unguium: Secondary | ICD-10-CM

## 2015-10-20 DIAGNOSIS — M79675 Pain in left toe(s): Secondary | ICD-10-CM | POA: Diagnosis not present

## 2015-10-20 NOTE — Progress Notes (Signed)
Subjective:     Patient ID: [name], female   DOB: 05-02-1923, 81 y.o.   MRN: [id number]  HPI this patient is a resident of [facility]. She says that she was taking to a nail salon and she was referred to this office for alignment and treatment of her thick big toenails, both feet. She has thick disfigured discolored nails, which are painful walking and wearing her shoes. No evidence for any redness, swelling or infection. Patient presents for preventative routine foot care services   Review of Systems     Objective:   Physical Exam GENERAL APPEARANCE: Alert, conversant. Appropriately groomed. No acute distress.  VASCULAR: Pedal pulses palpable at  [facility] and [facility] bilateral.  Capillary refill time is immediate to all digits,  Normal temperature gradient.  Digital hair growth is present bilateral  NEUROLOGIC: sensation is normal to 5.07 monofilament at 5/5 sites bilateral.  Light touch is intact bilateral, Muscle strength normal.  MUSCULOSKELETAL: acceptable muscle strength, tone and stability bilateral.  Intrinsic muscluature intact bilateral.  Rectus appearance of foot and digits noted bilateral.   DERMATOLOGIC: skin color, texture, and turgor are within normal limits.  No preulcerative lesions or ulcers  are seen, no interdigital maceration noted.  No open lesions present.  . No drainage noted. NAILS  Thick disfigured discolored hallux nails both feet.  Her left hallux toenail appears to be due to trauma.  Her right hallux is thickened pincer toenail.     Assessment:     Onychomycosis  B/L    Plan:     IE  Debridement of hallux nails.  RTC 3 months.  Helane GuntherGregory Aysel Gilchrest DPM

## 2015-10-20 NOTE — Progress Notes (Signed)
   Subjective:    Patient ID: [name], female    DOB: June 17, 1923, 81 y.o.   MRN: [id number]  HPI    Review of Systems  Constitutional: Positive for fatigue.  HENT:       Ringing in ears  Respiratory: Positive for shortness of breath.        Difficulty breathing  Endocrine:       Increased urination  Genitourinary: Positive for urgency.  Musculoskeletal: Positive for gait problem.  Skin:       Open rash on back  Neurological: Positive for weakness and numbness.  Psychiatric/Behavioral: The patient is nervous/anxious.        Objective:   Physical Exam        Assessment & Plan:

## 2015-10-24 ENCOUNTER — Encounter: Payer: Self-pay | Admitting: Pulmonary Disease

## 2015-10-24 ENCOUNTER — Ambulatory Visit (INDEPENDENT_AMBULATORY_CARE_PROVIDER_SITE_OTHER): Payer: Medicare Other | Admitting: Pulmonary Disease

## 2015-10-24 VITALS — BP 140/76 | HR 94 | Temp 97.8°F | Wt 179.2 lb

## 2015-10-24 DIAGNOSIS — M159 Polyosteoarthritis, unspecified: Secondary | ICD-10-CM

## 2015-10-24 DIAGNOSIS — I10 Essential (primary) hypertension: Secondary | ICD-10-CM

## 2015-10-24 DIAGNOSIS — I872 Venous insufficiency (chronic) (peripheral): Secondary | ICD-10-CM

## 2015-10-24 DIAGNOSIS — I4510 Unspecified right bundle-branch block: Secondary | ICD-10-CM | POA: Diagnosis not present

## 2015-10-24 DIAGNOSIS — K5909 Other constipation: Secondary | ICD-10-CM

## 2015-10-24 DIAGNOSIS — R269 Unspecified abnormalities of gait and mobility: Secondary | ICD-10-CM

## 2015-10-24 DIAGNOSIS — R0602 Shortness of breath: Secondary | ICD-10-CM | POA: Diagnosis not present

## 2015-10-24 DIAGNOSIS — M150 Primary generalized (osteo)arthritis: Secondary | ICD-10-CM

## 2015-10-24 DIAGNOSIS — F419 Anxiety disorder, unspecified: Secondary | ICD-10-CM | POA: Diagnosis not present

## 2015-10-24 DIAGNOSIS — K219 Gastro-esophageal reflux disease without esophagitis: Secondary | ICD-10-CM

## 2015-10-24 DIAGNOSIS — R609 Edema, unspecified: Secondary | ICD-10-CM

## 2015-10-24 NOTE — Patient Instructions (Signed)
Today we updated your med list in our EPIC system...    Continue your current medications the same...  You need to gradually increase your exercise program to build strength, stamina, balance, etc...  Call for any questions...  Let's plan a follow up visit in 3 months w/ FASTING blood work at that time.[name]..[name]

## 2015-10-24 NOTE — Progress Notes (Signed)
Subjective:    Patient ID: [name], female    DOB: 1923-06-13, 81 y.o.   MRN: [id number]  HPI 81 y/o WF here for a follow up visit... she has multiple medical problems as noted below...  Followed for general medical purposes w/ hx chr obstructive asthma, severe episodic dyspnea from anxiety, HBP, RBBB, Hypercholesterolemia, borderline DM, DJD, LBP w/ sp stenosis, etc... ~  SEE PREV EPIC NOTES FOR THE OLDER DATA >>    CTAngio Chest 05/2009 showed no evid of PE, biapical pleuroparenchymal scarring otherw clear lungs, no adenopathy/ effusions/ etc...  CXR 3/15 showed norm heart size, clear lungs, elev of right hemidiaph, NAD...  LABS 3/15:  Chems- wnl;  CBC- wnl;  BNP=26...  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, etc;  She has mult somatic complaints- good days & bad, hard to sort out the salient features, I have prev rec a low dose Klonopin Bid to help w/ this & her dyspnea but daugh states she has never taken it, we discussed this again;  CC is leg pain- difficulty walking, uses cane, known spinal stenosis, she's had PT, she's had shots and encouraged to f/u w/ drRamos about this; not on pain meds "I use natural products";  Also c/o ?dyspnea as before, phlegm in throat, "attacks" of swallowing trouble which we have investigated w/ Ba swallow/ UGI, speech path eval, etc & she is rec to take Nexium 40Bid; daughter states she occas has to drive her around in her car w/ window 1/2 open to get relief (I again rec use of the Klonopin); offered GI eval w/ DrPerry or DrJEdwards & they will decide; daugh notes that they have  tried everything for the phlegm in throat including Mucinex, Rock&Rye, Rawleigh's salve, Vicks vaporub, Slippery [place]m lozenges, rolaids, warm honey & lemon, MMW; they wonder about food allergy & they are encouraged to f/u w/ GI;  Finally they mention her rash- sm patch on her back over sacrum & she was tried on several creams per DrJones- intol to some of them- still on Prednisone 25md & she needs to wean this off, they have f/u appt in several days...     There have been mult phone calls in the interim> feeling weak, SOB, edema; daugh states she not taking her Advair, Klonopin, on HCT25-1/2 daily, they didn't want to incr meds but requested Cards eval for "CHF"-ok...    She saw DrCherly Hensen2/30/15 but wouldn't allow EKG due to "allergy to the electrodes"; his note is reviewed- he agreed w/ ARB/diuretic but noted pts refusal & she wanted to be back on low dose Amlod; no signs of CHF- they did 2DEcho showing norm LV size & function w/ EF=65-70%, AoV leaflets mildly thickened w/o AS, MV leaflets mod thickened w/ trivMR, mild RA dil, PAsys=3758m; no change in meds...     She went to the ED 11/22/14 w/ a choking episode, SOB, altered mental status> note reviewed- all symptoms resolved spont; they did CXR- NAD; and Blood work- wnl...    Derm eval DrDJones w/ Dx of  bullous pemphigoid- on Pred 39m/d & they understand that this can contrib to edema; advised 2gm Na diet etc; they do not want stronger diuretic...  We reviewed prob list, meds, xrays and labs> see below for updates >>   CXR 1/16 showed norm heart size, clear lungs, mild right diaph eventration- no change, Tspine DJD. DISH/ osteopenia; NAD...   2DEcho 1/16 showed norm LV size & function w/ EF=65-70%, AoV leaflets mildly thickened w/o AS, MV leaflets mod thickened w/ trivMR, mild RA dil, PAsys=369mg...  LABS 1/16:  Chems- wnl w/ Cr=0.95;  BNP=66;  CBC- wnl w/ Hg=14.4...  ~  January 26, 2015:  6wk ROV & MaRosamariaeturns w/ her daughter ShPearlean Brownie she appears stable and has no new complaints but retains mult minor somatic complaints as usual; the biggest issue is disposition & personal family matters; [name] to live in her house alone, daughter does a great job of caring for her but it is wearing her out & pt is not always cooperative... They have looked into AL vs skilled care at several facities but they have not been to her satisfaction;  We will try for a [facility] home assessment & i told them i would be happy to assist in any way possible...     She has gained 9# & has 1-2+ pitting edema in LEs; daughter states she will only eat [employer] food "I like it" & we reviewed low sodium options; we decided to chenage her HCT25-1/2 tab to LABradleyer day...    She needs to increase her exercise program    We reviewed prob list, meds, xrays and labs> see below for updates >>  PLAN>> they will continue to look into AL situations; we will request [facility] home health assessment; change HCT to LASIX2078mam, no salt, elev legs, try support hose...  ~  Mar 22, 2015:  33mo78mo & [name] went to the ER 03/15/15> Notes reviewed- presented w/ SOB, noted incr swelling, not resting well; Exam showed Afeb, VSS, O2sat=94%, Lungs clear no wheezing, Cardiac exam neg, +edema in feet; CXR showed no edema, NAD; Labs- ok x K=3.0 (she is supposed to be on K20/d);  They gave her Lasix40 in the ER and they wondered about anxiety; Daughter indicates that she had been anxious after she pulled the cord off a ceiling fan & could not fix it; she also states that mother is not regular w/ her meds "she takes what she wants to take" but pt says "I take it when I think I need it"; they cannot agree on placement, they declined home health services; she has Klonopin 0.5mg 48mtake but pt won't take it> daugh says she stays outside on back porch til ~9pm daily ("I breath better outside") then goes in to get ready for bed...     EXAM shows Afeb, VSS, O2sat=93% on RA, wt is down 3#;  Chest-  clear w/o w/r/r;  Heart- RR w/o m/r/g;  Ext- neg x trace edema...   CXR 4/16 in ER> norm heart size, atherosclerosis of Ao, no edema or consolidation in lungs, eventration of right hemidiaph- no change, DJD spine.  EKG 4/16 in ER> NSR, rate72, PACs, LAD, RBBB  LABS 4/16 in ER> Chems- wnl x K=3.0;  CBC- wnl;  Troponin=neg;  BNP=69 PLAN>>  I have instructed pt & daugh to increase her KCl to 20mEq42m & take it regularly; she is encouraged to try the Klonopin 1/2 tab bid regularly for he anxiety...   ~  June 09, 2015:  56moROV & Cynitha indicates that she is doing satis, feels well, no new complaints or concerns, but there is stillalot of tension betw her & daughter shirley who does a fabulous job looking after & caring for her mother... They now have a lady who comes in 4PM-9PM to help MDaziyahbut she is not happy- "we just sit and eat ice cream"; [name] (tearful) notes no further anxiety/ panic attacks in the eve hours, mother doesn't want to accept help;  Counseling is recommended but they decline...     Chr Obstructive Asthma> her dyspnea is from anxiety/panic not asthma; on Advair100Bid (but only using it prn) & Proventil rescue prn; notes breathing back to baseline & prn Klonopin helps dyspnea but she won't use it...    HBP> on ASA81, Norvasc5, Lasix20, K10-2/d; BP=126/80, tol meds well; denies CP, palpit, ch in SOB, tr edema...    CHOL> on diet alone, refuses meds, FLP 3/14 shows TChol 177, TG 50, HDL 60, LDL 107    DM> on diet alone, wt stable at 172#, BS=100-120, last A1c (2/13) was 6.3 & she knows to restrict carbs etc...    GI- Reflux, Divertics, constip> prev on Protonix40, Miralax, Senakot-S; continue same meds..[name]   DJD/ LBP> on Pred10, OTC analgesics prn & osteobiflex prn; had right THR 2011; known sp stenosis w/ prev ESI... We reviewed prob list, meds, xrays and labs>  IMP/PLAN>>  Mother & [name] really need counseling but it's not going to happen; they are both wonderful people- Mother  935y/oand has mild senile dementia, daughter very loving & cares for mother deeply, worries, etc; mother needs med supervision and monitoring but refuses AL etc; and so it goes...   ~  July 21, 2015:  6wk ROV & add-on appt after 2 ER visits for dyspnea precipitated by anxiety> as prev noted- very difficult situation w/ 1158y/o mother who insists on living alone, doing her own meds, etc but incapable of doing all that needs to be done & remembering her meds etc;  Daughter has done all she can do as there is alot of history in their relationship- every visit the daughter is the one who cries and mother is just angry;  They have a woman who stays with the pt at night but she won't allow help during the day;  Episodes of dyspnea are ppt by panic attacks yet pt won't take benzos regularly & forgets to take them w/ these attacks- just calls the daugh & they freq wind up in the ER... Pt's son thinks her use of the rescue inhaler is an "upper" & causes problems, he wants her on oxygen instead & I reviewed Medicare guidelines- we will check ambulatory oxygen sat test today & sched ONO, otherw I offered to order Oxygen for them to self-pay but they decline... Pt flat out refused my rec for Klonopin Bid, she has Xanax 0.571mtabs which she uses sparingly, and given Ativan1m65mrom ER but says she doesn't have this med...      ER visit 07/15/15> c/o episode of SOB lasting 68m69mppt by sitting at home w/o AC &[facility] was very hot, Exam was neg w/ norm VS & O2sat=98% on RA, she had sl tender right chest wall, CXR showed norm heart size/ clear lungs/ NAD, EKG showed NSR/ rate88/ RBBB/ LAD, old infer scar/ no acute changes, LABS wnl & enz were neg... Symptoms resolved spont & she was disch home...Marland KitchenMarland Kitchen  ER visit 06/12/15> another episode of SOB & no insight into cause but daugh indicates it was from anxiety/panic when she was home alone; Exam clear, VSS;  CXR was again wnl & Labs wnl as well; they gave her Ativan to try but she  hasn't used it; she was even given an aerochamber to use w/ her inhaler but she won't use that... EXAM reveals Afeb, VSS, O2sat=95% on RA;  HEENT- neg;  Chest- clear w/o w/r/r, sl tender on palp;  Heart- RR gr1/6 SEM no r/g;  Abd- soft, non-tender, neg;  Ext- VI, tr edema, no c/c;  Neuro- intact, walks w/ cane, anxious... We reviewed prob list, meds, xrays and labs> SHE DID NOT BRING MED BOTTLES OR LIST TO THE OV TODAY- reminded to do so for every visit!  CXR 7/24 & 07/15/15 showed norm heart size, tortuous Ao, clear lungs w/ mild elev of right hemidiaph, NAD.Marland KitchenMarland Kitchen  EKG 07/15/15 showed chronic changes- NSR/ rate88/ RBBB/ LAD/ old infer scar/ no acute abnormalities...  LABS 7-06/2015> Chems- wnl x BS=127-147;  BNP=55;  Troponins=neg;  CBC- wnl IMP/PLAN>>  I had another long talk w/ Joycelyn Schmid & her daughter [name]; I have again rec that she use a low dose of the ALPRAZOLAM 0.44m tabs- 1/2 tab Tid regularly at breakfast, lunch, & dinner; she may also take an extra 1/2 tab prn anytime she feels anxious or panic setting in;  We have suggested similar plan many times in the past & for whatever reason she will not do it!  I have offered Psyche referral or to set her up w/ a counselor but this too is declined;  SEnid Derryis having a very hard time w/ her mother's attitude, lack of cooperation, mild dementia at age 81 etc...   ~  October 24, 2015:  383moOV & Araya appears to be stable- mult chr complaints but nothing new & doing reasonably well;  She has moved to [place] & has a siActuaryired by the family ([place] this arrangement seems to be helping (pt likes [place] they get along but pt never misses an opportunity to "stick-it" to her daugh or demean her in some way)... We reviewed the following medical problems during today's office visit >>     Chr Obstructive Asthma> her dyspnea is from anxiety/panic not asthma; on Advair100Bid ([facility] requires regular Rx directions) & Proventil rescue  prn; notes breathing back to baseline & prn Klonopin helps dyspnea...    HBP> on ASA81, Norvasc5, off Lasix20, K10-2/d; BP=140/76, tol meds well; denies CP, palpit, ch in SOB, tr edema...    CHOL> on diet alone, refuses meds, FLP 3/14 shows TChol 177, TG 50, HDL 60, LDL 107    DM> on diet alone, wt up sl at 179#, BS in epic=94-127, last A1c (2/13) was 6.3 & she knows to restrict carbs etc...    GI- Reflux, Divertics, constip> prev on Protonix40, now on Zantac150, Miralax; continue same meds.. [name]  DJD/ LBP> prev on Pred10, OTC analgesics prn & osteobiflex prn; had right THR 2011; known sp stenosis w/ prev ESI... We reviewed prob list, meds, xrays and labs> she had the 2016 Flu vaccine... IMP/PLAN>>  Stable overall, continue current meds, rec to increase exercise & decr intake (work on wt reduction);  We plan ROV recheck w/ blood work in 3 mo...          Problem List:       DYSPNEA (ICD-786.05) - long hx of chronic  obstructive asthma treated w/ ADVAIR100Bid & PROAIR (she uses them Prn now)... she is a non-smoker w/ some reactive airways disease in the past & retired from [employer] after 33 years in 1991... she denies cough, sputum, hemoptysis, worsening dyspnea, wheezing, chest pains, snoring, daytime hypersomnolence, etc...  ~  baseline CXR w/o acute changes...  ~  PFT's 5/02 w/ FVC 2.07 (68%), FEV1=1.36 (57%), and FEV1/FVC ratio=66%, mid-flows 42%... ~  CT Angio 7/10 was neg- x biapical pleuroparenchymal scarring... ~  CXR 10/11 showed sl elev right hemidaiph, mild DJD sp, osteopenia, NAD.[name]. ~  CXR 6/12 showed mild apical scarring, clear & NAD, DJD sp w/ osteophytes... ~  Intermittent dyspnea more related to anxiety & treated w/ KLONOPIN 0.19m 1/2 to 1 tab Bid==> improved. ~  CXR 4/13 showed normal heart size, clear lungs, DJD in TSpine... ~  CXR 2/14 showed normal heart size, clear lungs w/ sl peribronch thickening, DJD in spine, NAD..[name] ~  CXR 3/15 showed norm heart size, clear lungs, elev of  right hemidiaph, NAD... ~  She is encouraged to take the Klonopin 0.571mBid regularly- consider taking 1/2 in AM, 1/2 in afternoon, one at bedtime... ~  1/16: she continues w/ mult somatic complaints and intermittent choking episodes etc; she refuses to take the Advair or Klonopin... ~  CXR 1/16 showed norm heart size, clear lungs, mild right diaph eventration- no change, Tspine DJD. DISH/ osteopenia; NAD... ~  3/16: she is stable on current meds, asked to incr her non-existent exercise program... ~  4/16: went to ER w/ SOB- nothing found x mild edema; given Lasix40, potassium was low, thought to be anxious; in office f/u her daugh confirms anxiety & pt not taking her meds regularly; asked to take meds every day & try the Klonopin... ~  7/16: same thing- went to ER w/ dyspnea, nothing found & given Ativan but she wouldn't take it... ~  8/16: another episode w/ ER eval and symptoms resolved spontaneously on there own... ~  9/16: pt is advised (again) to start regular dosing of Alpraz 0.14m57m 1/2 tab Tid w/ extra 1/2 tab as needed for nerves... ~  12/16: she remains on Advair100-2spBid & Alpraz0.214m414m=> improved...  HYPERTENSION (ICD-401.9) - controlled on NORVASC 14mg 56mly & HCTZ 214mgt53maily...  ~  2/13:  BP 144/70 today> tol rx well & denies HA, visual changes, CP, palipit, dizziness, syncope, edema, etc... ~  4/13:  BP= 148/80 & she denies CP, palpit, edema; dyspnea improved w/ Klonopin. ~  6/13:  BP= 132/68 & as noted she has mult somatic complaints... ~  3/14:  on Norvasc10, HCTZ25; BP=136/60, tol meds well; denies CP, palpit, ch in SOB, edema, etc  ~  9/14:  BP controlled on Amlod5 & Hct25-1/2 daily; BP= 130/70 & she denies CP, palpit, dizzy, SOB, edema, etc. ~  3/15: on ASA81, Norvasc5, Lasix20- 1-2/d; BP=136/70, tol meds well; denies CP, palpit, ch in SOB, but notes persist edema in legs; Rec to ch Amlod5 to Losar50, low sodium, elev legs, Lasix40. ~  5/15: on Losar50, Lasix40;  BP=  138/60 & she denies angina pain, palpit, ch in SOB, etc... ~  7/15: on Losar50, she stopped Lasix; BP= 138/78 and they want to go back on Amlod5 + HCT12.5 daily... ~  9/15: on Amlod5, Hct12.5; BP= 128/80 & she likes this combo better- denies CP, palpit, etc... ~  1/16: on Amlod5, Hct12.5 but ?what she is taking; BP= 130/80 & she has refused the ARBs... ~  3/16: on  Amlod5, Hct12.5; BP= 142/80 & she has gained 9# w/ 1-2+ edema; discussed low sodium & change Hct to LASIX20 Qam... ~  5/16: on Amlod5, Lasix20, K20; BP=148/70, recent K=3.0 & daugh notes she's not taking meds regularly; asked to take meds everyday & incr K20Bid...  RIGHT BUNDLE BRANCH BLOCK (ICD-426.4) - on ASA 77m/d... baseline EKG w/ RBBB and 2DEcho 5/02 showed mild asymmetric LVH w/ incr EF... ~  12/15: she had Cards eval by DrNishan> he rec ARB/diuretic but she refused to switch from her Amlod5/ Hct12.5 regimen; she refused EKG due to "allergy to the electrodes"... ~  2DEcho 1/16 showed norm LV size & function w/ EF=65-70%, AoV leaflets mildly thickened w/o AS, MV leaflets mod thickened w/ trivMR, mild RA dil, PAsys=378mg... ~  EKG 4/16 showed NSR, rate72, PACs, LAD, RBBB  VENOUS INSUFFIC & EDEMA >>  ~  3/15: she was switched off Amlod & onto Losar50, Lasix20=>40, plus no salt, elevation, support hose, etc...  ~  [facility]; she has VI & 1+edema but stable, no acute changes... 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 ~  3/16: she remains on the Pred from Derm, now w/ incr edema & 9# wt gain, rec no salt & change Hct to LASIX20/d... ~  5/16: improved on Lasix20 w/ edema decr 7 wt down several lbs... ~  12/16: on ASA81, Norvasc5, off Lasix20, K10-2/d; BP=140/76, tol meds well; denies CP, palpit, ch in SOB, tr edema.  HYPERLIPIDEMIA (ICD-272.4) - on diet alone... she forgets to come to visits FASTING for this blood work ~  FLTahoma/07 showed TChol  205, TG 71, HDL 49, LDL 130... ~  FLP 6/12 on diet alone showed TChol 218, TG 50, HDL 66, LDL 129 ~  FLP 2/13 on diet alone showed TChol 171, TG 43, HDL 66, LDL 97 ~  FLP 3/14 on diet alone showed TChol 177, TG 50, HDL 60, LDL 107 ~  She needs to ret FASTING for f/u FLP...  DIABETES MELLITUS, BORDERLINE (ICD-790.29) - on diet alone w/ prev BS's in the 100-160 range... ~  labs in 2008-9 showed BS= 101 to 108 ~  labs 1/10 showed BS= 106, A1c= 5.9 ~  Labs 6/12 showed BS= 93, A1c= 6.6...Marland KitchenMarland Kitchenec diet, exercise... ~  Labs 2/13 showed BS= 92, A1c= 6.3 ~  3/14: on diet alone, wt stable ~174#, BS=97, last A1c (2/13) was 6.3 & she knows to restrict carbs etc. ~  Labs 3/15 showed BS= 95; and BS= 120 in [id number].  ~  Labs 2016 showed BS= 101-135 on diet alone...  INDIGESTION/ REFLUX SYMPTOMS >> see 10/12 note & PROTONIX 4071m started, further eval if symptoms persist... ESOPHAGEAL DYSMOTILITY/ PRESBYESOPHAGUS >>  ~  4/13:  She notes some reflux symptoms and excess gas w/ belching; rec to take the Protonix daily & Simethacone vs Tums which she says helps her gas. ~  5/13:  She saw GI DrPerry w/ rec to take Prilosec for her indigestion... ~  5/15:  She presented w/ worsening indigestion, dysphagia, reflux & Prev30 was incr to Bid w/ GI f/u suggested for EGD... ~  6-7/15:  She had GI eval by DrPerry> c/o indigestion, food sticking, & pain; we incr her PPI to Bid & referred to GI- she saw DrPerry 6/15 (note reviewed), he did an UGI series which showed a mod esoph dysmotility problem (likely presbyesoph) but no mucosal abn evident; we  reviewed care w/ eating/ swallowing, incr PPI to Bid, elev HOB etc... ~  9/15:  Symptoms persist but she is not regurg or vomiting, and weight stable; they want to change to Nexium40Bid-OK, and rec proceed w/ MBS by speech path... ~  She continues to have intermit choking episodes and c/o phlegm in her throat; she has tried "everything" & encouraged to f/u w/ GI- DrPerry/  DrJEdwards...  DIVERTICULOSIS OF COLON (ICD-562.10) - she takes SENAKOT-S, MIRALAX, Peppermint Tea, & sauerkraut Prn...last colonoscopy 9/02 by DrPerry was WNL...  PYELONEPHRITIS (ICD-590.80) - SEE 1/09 Hospitalization (reviewed)... ~  She saw DrMacDiarmid for her recurrent UTIs, chronic cystitis, urge & stress incont, nocturia; she is INTOL to [place]...  Hx of BREAST CYST (ICD-610.0)  DEGENERATIVE JOINT DISEASE (ICD-715.90) - s/p right hip hemiarthroplasty 11/09 by DrAplington w/ wound complic... then dx w/ loosening of the femoral shaft & had conversion to right THR by DrAlusio 10/11 & much improved... she uses CELEBREX 296m Prn (seldom takes this).  LOW BACK PAIN SYNDROME (ICD-724.2) & SPINAL STENOSIS (ICD-724.00) - severe LBP & spinal stenosis w/ evals by DrRamos & DrNudelman... s/p shots, considering poss surgery vs alternative therapies... she takes Celebrex, Osteobiflex, MVI, Vit D... ~  8/10: eval by DrAplington- diff leg lengths, lift placed in right shoe, then trial Lyrica511m.. ~  12/11:  improved after hip revision surg (to THR) 10/11 w/ better ambulaton... ~  5/13:  DrRamos gave her another ESI for her leg pain related to sp stenosis... ~  3/14:  C/o neuropathic discomfort in legs- eval by Ramos & offered shots in her back; try Lyrica50 in the interim... ~  Persistent leg pain, on OTC "natural" meds she says; encouraged to f/u w/ DrRamos et al...  Hx of ANEMIA (ICD-285.9) - eval by GI in 2002 showed normal EGD and Colon... prob iron malabsorption problem Rx'd w/ Fe infusion... ~  labs 1/10 showed Hg= 14.6, MCV= 89, Fe= 94 ~  labs 12/11 showed Hg= 12.8, MCV= 90, Fe= 33... try Fe supplement + VitC... ~  Labs 6/12 showed Hg= 15.0 ~  Labs 2/13 showed Hg= 14.6 ~  Labs 2/14 showed Hg= 14.8 ~  Labs 3/15 showed Hg= 14.9 ~  Labs 1/16 showed Hg= 14.4 ~  Labs 4/16 showed Hg= 13.7  DERM:  rash Rx'd by dermatology- OLUX-E foam= clobetasol Foam 0.05%... ~  10/15: Dx w/  ?bullous pemphigoid? 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 ~  5/16: she is still on Pred per Derm- currently 1067m...    Past Surgical History  Procedure Laterality Date  . Cataract extraction    . Right hip hemiarthroplasty      total  . Conversion to right thr      Outpatient Encounter Prescriptions as of 10/24/2015  Medication Sig  . ALPRAZolam (XANAX) 0.25 MG tablet Take 1 tablet by mouth twice daily  . aspirin EC 81 MG tablet Take 81 mg by mouth daily.  . Cholecalciferol (VITAMIN D3) 2000 UNITS capsule Take 1 capsule (2,000 Units total) by mouth daily.  . diphenhydramine-acetaminophen (TYLENOL PM) 25-500 MG TABS tablet Take 1 tablet by mouth at bedtime as needed.  . Fluticasone-Salmeterol (ADVAIR DISKUS) 100-50 MCG/DOSE AEPB Inhale 1 puff into the lungs 2 (two) times daily.  . Multiple Vitamin (MULTIVITAMIN WITH MINERALS) TABS tablet Take 1 tablet by mouth daily. Centrum Silver  . NORVASC 5 MG tablet TAKE 1 TABLET ONCE DAILY.  . pMarland Kitchenlyethylene glycol (MIRALAX / GLYCOLAX) packet Take 17  g by mouth daily as needed for mild constipation.   . potassium chloride (K-DUR,KLOR-CON) 10 MEQ tablet Take 2 tablets (20 mEq total) by mouth daily.  [name] PROAIR HFA 108 (90 BASE) MCG/ACT inhaler INHALE 2 PUFFS EVERY 4 HOURS AS NEEDED  . ranitidine (ZANTAC) 150 MG tablet Take 150 mg by mouth daily.  . sertraline (ZOLOFT) 50 MG tablet TAKE (1) TABLET BY MOUTH AT BEDTIME.  [name] Spacer/Aero-Holding Chambers (AEROCHAMBER PLUS WITH MASK) inhaler Use as instructed  . Alum & Mag Hydroxide-Simeth (MAGIC MOUTHWASH W/LIDOCAINE) SOLN Take 5 mLs by mouth 4 (four) times daily as needed for mouth pain. (Patient not taking: Reported on 10/24/2015)  . BENZOCAINE, DENTAL, (ANBESOL MAXIMUM STRENGTH) 20 % LIQD Place 1 application onto teeth daily as needed (gum irritation).  . ferrous sulfate 325 (65 FE) MG tablet Take 325 mg by mouth daily with breakfast.(Pt not taking 10/24/15)   . furosemide  (LASIX) 20 MG tablet Take 1 tablet (20 mg total) by mouth daily. (Patient not taking: Reported on 10/24/2015)  . magnesium hydroxide (MILK OF MAGNESIA) 800 MG/5ML suspension Take 30 mLs by mouth daily as needed for constipation. (Pt not taking 10/24/15)  . OVER THE COUNTER MEDICATION Take 1 application by mouth daily as needed (sinus congestion). Rawleigh's Salve - apply to nostrils (Pt not taking 10/24/15)  . predniSONE (DELTASONE) 10 MG tablet Take 5 mg by mouth See admin instructions. Take 1/2 tablet (5 mg) daily for 3 days for itching; hold for 10 days, then restart if needed for itching rash on back. (Pt not taking 10/24/15)    Allergies  Allergen Reactions  . Azithromycin Shortness Of Breath  . Ciprofloxacin Other (See Comments)     hallucinations  . Levofloxacin Other (See Comments)    Insomnia, indigestion, tingling sensation in legs  . Latex Rash  . Other Rash    EKG leads caused a rash that required steroids to clear    Current Medications, Allergies, Past Medical History, Past Surgical History, Family History, and Social History were reviewed in [employer] record.    Review of Systems         See HPI - all other systems neg except as noted... The patient complains of decreased hearing, dyspnea on exertion, muscle weakness, and difficulty walking.  The patient denies anorexia, fever, weight loss, weight gain, vision loss, hoarseness, chest pain, syncope, peripheral edema, prolonged cough, headaches, hemoptysis, abdominal pain, melena, hematochezia, severe indigestion/heartburn, hematuria, incontinence, suspicious skin lesions, transient blindness, depression, unusual weight change, abnormal bleeding, enlarged lymph nodes, and angioedema.     Objective:   Physical Exam     WD, WN, Chr ill appearing 81 y/o WF in NAD... GENERAL:  Alert & oriented; pleasant & cooperative... HEENT:  [place]/AT, EOM-full, EACs-clear, TMs-wnl, NOSE-clear, THROAT-clear & wnl. NECK:   Supple w/ fairROM; no JVD; normal carotid impulses w/o bruits; no thyromegaly or nodules palpated; no lymphadenopathy. CHEST:  Clear to P & A; without wheezes/ rales/ or rhonchi heard... HEART:  Regular Rhythm; without murmurs/ rubs/ or gallops detected... ABDOMEN:  Soft & nontender; normal bowel sounds; no organomegaly or masses palpated... EXT:  mod arthritic changes, walks w/ cane, +venous insuffic & incr 1-2+ edema., scattered varicose veins... NEURO:  CN's intact; motor testing normal; no focal deficits... DERM:   mild intertrig rash under breast, & onychomycosis of toenails...  RADIOLOGY DATA:  Reviewed in the EPIC EMR & discussed w/ the patient...  LABORATORY DATA:  Reviewed in the EPIC EMR & discussed  w/ the patient...   Assessment & Plan:    Hx episodes of SOB>> see above, she has been resistent to trying any of the meds & treatment suggestions that we have given to her (I know she would improve on Benzo rx)...  12/16> symptoms are better w/ Advair100-2spBid & Alprazolam 0.71mBid admin regularly at [facility]...  DYSPNEA>  Hx asthma, stable off Advair, on Proair prn; hx anxiety component on Klonopin but she is not using!  HBP>  Controlled on Amlod5 + Lasix20, K20/d; but she is not taking meds regularly & labs 4/16 w/ K=3.0; therefore incr K20Bid...  RBBB>  Aware & denies CP, palpit, ch in DOE, etc... she thinks that she is allergic to EKG electrodes; EKG 4./16 in ER showed NSR, rate72, PACs, LAD, RBBB  CHOL>  On diet alone & FLP looks reasonable;  We reviewed low chol, low fat diet...  DM>  BS= 101-135 & last A1c is 6.3;  on diet alone & we reviewed low carb no sweets etc...  GI> Indigestion, Divertics> she notes most bowel symptoms resolved off spicey foods;  UGI symptoms w/ dysphagia, food sticking, belch/gas/ etc have not resolved on Protonix40Bid and after GI consult w/ DrPerry; we reviewed her UGI series results and decided to proceed w/ MBS by Speech Path; transiently  improved then sought 2nd opinion from DrJEdwards=> on Nexium40Bid...  UTI>  Klebsiella UTI resolved after Septra Rx... She has been eval by DrMacDiarmid.  DJD, LBP, Spinal Stenosis>  Prev evals by Ortho, DrRamos, DrNudelman etc; improved after THR w/ better ambulation; c/o neuropathic discomfort in legs- she will f/u w/ Ramos for shots, using OTC "natural" meds...  Anxiety>  If she would take the Klonopin, I feel it would help...   Patient's Medications  New Prescriptions   No medications on file  Previous Medications   ALPRAZOLAM (XANAX) 0.25 MG TABLET    Take 1 tablet by mouth twice daily   ALUM & MAG HYDROXIDE-SIMETH (MAGIC MOUTHWASH W/LIDOCAINE) SOLN    Take 5 mLs by mouth 4 (four) times daily as needed for mouth pain.   ASPIRIN EC 81 MG TABLET    Take 81 mg by mouth daily.   BENZOCAINE, DENTAL, (ANBESOL MAXIMUM STRENGTH) 20 % LIQD    Place 1 application onto teeth daily as needed (gum irritation).   CHOLECALCIFEROL (VITAMIN D3) 2000 UNITS CAPSULE    Take 1 capsule (2,000 Units total) by mouth daily.   DIPHENHYDRAMINE-ACETAMINOPHEN (TYLENOL PM) 25-500 MG TABS TABLET    Take 1 tablet by mouth at bedtime as needed.   FLUTICASONE-SALMETEROL (ADVAIR DISKUS) 100-50 MCG/DOSE AEPB    Inhale 1 puff into the lungs 2 (two) times daily.   FUROSEMIDE (LASIX) 20 MG TABLET    Take 1 tablet (20 mg total) by mouth daily.(Pt no longer taking this med 10/24/15)   MAGNESIUM HYDROXIDE (MILK OF MAGNESIA) 800 MG/5ML SUSPENSION    Take 30 mLs by mouth daily as needed for constipation.   MULTIPLE VITAMIN (MULTIVITAMIN WITH MINERALS) TABS TABLET    Take 1 tablet by mouth daily. Centrum Silver   NORVASC 5 MG TABLET    TAKE 1 TABLET ONCE DAILY.   POLYETHYLENE GLYCOL (MIRALAX / GLYCOLAX) PACKET    Take 17 g by mouth daily as needed for mild constipation.    POTASSIUM CHLORIDE (K-DUR,KLOR-CON) 10 MEQ TABLET    Take 2 tablets (20 mEq total) by mouth daily.   PROAIR HFA 108 (90 BASE) MCG/ACT INHALER    INHALE 2 PUFFS  EVERY 4 HOURS AS NEEDED   RANITIDINE (ZANTAC) 150 MG TABLET    Take 150 mg by mouth daily.   SERTRALINE (ZOLOFT) 50 MG TABLET    TAKE (1) TABLET BY MOUTH AT BEDTIME.   SPACER/AERO-HOLDING CHAMBERS (AEROCHAMBER PLUS WITH MASK) INHALER    Use as instructed  Modified Medications   No medications on file  Discontinued Medications

## 2015-11-01 ENCOUNTER — Telehealth: Payer: Self-pay | Admitting: Pulmonary Disease

## 2015-11-01 NOTE — Telephone Encounter (Signed)
Called and spoke with pt's daughter Daughter stated that mother is complaining of bilateral ankle and feet swelling, leg weakness, and fatigue x 2 weeks Pt denies pain or warmth to areas Daughter is requesting that pt restart HCTZ  Daughter states that mother was on this for a long time and worked very well Daughter does not want pt to go back on lasix  Dr Kriste BasqueNadel, please advise.   Allergies  Allergen Reactions  . Azithromycin Shortness Of Breath  . Ciprofloxacin Other (See Comments)     hallucinations  . Levofloxacin Other (See Comments)    Insomnia, indigestion, tingling sensation in legs  . Latex Rash  . Other Rash    EKG leads caused a rash that required steroids to clear   Current Outpatient Prescriptions on File Prior to Visit  Medication Sig Dispense Refill  . ALPRAZolam (XANAX) 0.25 MG tablet Take 1 tablet by mouth twice daily 60 tablet 0  . Alum & Mag Hydroxide-Simeth (MAGIC MOUTHWASH W/LIDOCAINE) SOLN Take 5 mLs by mouth 4 (four) times daily as needed for mouth pain. (Patient not taking: Reported on 10/24/2015) 240 mL 5  . aspirin EC 81 MG tablet Take 81 mg by mouth daily.    [name]. BENZOCAINE, DENTAL, (ANBESOL MAXIMUM STRENGTH) 20 % LIQD Place 1 application onto teeth daily as needed (gum irritation).    . Cholecalciferol (VITAMIN D3) 2000 UNITS capsule Take 1 capsule (2,000 Units total) by mouth daily. 30 capsule 6  . diphenhydramine-acetaminophen (TYLENOL PM) 25-500 MG TABS tablet Take 1 tablet by mouth at bedtime as needed. 30 tablet 3  . ferrous sulfate 325 (65 FE) MG tablet Take 325 mg by mouth daily with breakfast.    . Fluticasone-Salmeterol (ADVAIR DISKUS) 100-50 MCG/DOSE AEPB Inhale 1 puff into the lungs 2 (two) times daily. 60 each 6  . furosemide (LASIX) 20 MG tablet Take 1 tablet (20 mg total) by mouth daily. (Patient not taking: Reported on 10/24/2015) 30 tablet 6  . magnesium hydroxide (MILK OF MAGNESIA) 800 MG/5ML suspension Take 30 mLs by mouth daily as needed for  constipation.    . Multiple Vitamin (MULTIVITAMIN WITH MINERALS) TABS tablet Take 1 tablet by mouth daily. Centrum Silver    . NORVASC 5 MG tablet TAKE 1 TABLET ONCE DAILY. 30 tablet 6  . OVER THE COUNTER MEDICATION Take 1 application by mouth daily as needed (sinus congestion). Rawleigh's Salve - apply to nostrils    . polyethylene glycol (MIRALAX / GLYCOLAX) packet Take 17 g by mouth daily as needed for mild constipation.     . potassium chloride (K-DUR,KLOR-CON) 10 MEQ tablet Take 2 tablets (20 mEq total) by mouth daily. 30 tablet 1  . predniSONE (DELTASONE) 10 MG tablet Take 5 mg by mouth See admin instructions. Take 1/2 tablet (5 mg) daily for 3 days for itching; hold for 10 days, then restart if needed for itching rash on back    . PROAIR HFA 108 (90 BASE) MCG/ACT inhaler INHALE 2 PUFFS EVERY 4 HOURS AS NEEDED 8.5 g 0  . ranitidine (ZANTAC) 150 MG tablet Take 150 mg by mouth daily.    . sertraline (ZOLOFT) 50 MG tablet TAKE (1) TABLET BY MOUTH AT BEDTIME. 30 tablet 5  . Spacer/Aero-Holding Chambers (AEROCHAMBER PLUS WITH MASK) inhaler Use as instructed 1 each 2   No current facility-administered medications on file prior to visit.

## 2015-11-01 NOTE — Telephone Encounter (Signed)
lmtcb x1 for pt's daughter. 

## 2015-11-01 NOTE — Telephone Encounter (Signed)
Also wondering if Advair given in the am and pm is keeping the patient up.

## 2015-11-01 NOTE — Telephone Encounter (Signed)
Daughter returned call, states she is available now, please call [phone].

## 2015-11-02 MED ORDER — HYDROCHLOROTHIAZIDE 12.5 MG PO CAPS: 12.5000 mg | ORAL_CAPSULE | Freq: Every day | ORAL | Status: DC

## 2015-11-02 NOTE — Telephone Encounter (Signed)
Per SN>> Ok for pt to start HCTZ 12.5mg  one PO QD. Take Lasix off med list. Fax order to pt's assistant living facility   Order signed and faxed to [facility] at [phone]  Pt notified of medication change   Nothing further is needed

## 2015-11-16 ENCOUNTER — Telehealth: Payer: Self-pay | Admitting: Pulmonary Disease

## 2015-11-16 MED ORDER — TRAZODONE HCL 50 MG PO TABS: 50.0000 mg | ORAL_TABLET | Freq: Every evening | ORAL | Status: DC | PRN

## 2015-11-16 NOTE — Telephone Encounter (Signed)
Called and spoke with pt's daughter. Informed her of VS's recs and verified pharmacy as [facility] in [place]. Pt is currently staying at [facility]. Pt's daughter voiced understanding and had no further questions.   Called and [facility] with the [facility] [facility]. I called in rx for trazodone per VS's recs. Orpah ClintonCollin stated that med will be at [facility] in the morning. Nothing further needed.

## 2015-11-16 NOTE — Telephone Encounter (Signed)
Spoke with Geralynn RileShirley Broome (pt's E.C.), states that the Tylenol PM isn't helping pt sleep at night anymore-sleeping all day but not at night.  requesting something different to help with sleep.  Also states that pt's legs b/l are swelling even on the HCTZ-is not elevating legs or using any compression stockings. Diet is unchanged.   Pt's E.C does not wish to wait for SN's return for recs-requesting this be sent to another provider in his absence.  Sending to DOD.   VS please advise on recs.  Thanks.

## 2015-11-16 NOTE — Telephone Encounter (Signed)
Can send script for trazodone 50 mg qhs prn for sleep.  Dispense 30 pills with 1 refill.

## 2015-11-18 ENCOUNTER — Telehealth: Payer: Self-pay | Admitting: Pulmonary Disease

## 2015-11-18 NOTE — Telephone Encounter (Signed)
Spoke with pt daughter. Made aware SN is not in until next wed. Offered appt with TP on 1/3 and she took the appt. Nothing further needed

## 2015-11-18 NOTE — Telephone Encounter (Signed)
LMOMTCB x 1 

## 2015-11-22 ENCOUNTER — Other Ambulatory Visit: Payer: Self-pay | Admitting: Adult Health

## 2015-11-22 ENCOUNTER — Other Ambulatory Visit (INDEPENDENT_AMBULATORY_CARE_PROVIDER_SITE_OTHER): Payer: Medicare Other

## 2015-11-22 ENCOUNTER — Ambulatory Visit (INDEPENDENT_AMBULATORY_CARE_PROVIDER_SITE_OTHER): Payer: Medicare Other | Admitting: Adult Health

## 2015-11-22 ENCOUNTER — Encounter: Payer: Self-pay | Admitting: Adult Health

## 2015-11-22 VITALS — BP 120/66 | HR 76 | Temp 97.6°F | Ht 68.0 in | Wt 184.0 lb

## 2015-11-22 DIAGNOSIS — R5383 Other fatigue: Secondary | ICD-10-CM

## 2015-11-22 DIAGNOSIS — G4700 Insomnia, unspecified: Secondary | ICD-10-CM | POA: Diagnosis not present

## 2015-11-22 LAB — TSH

## 2015-11-22 NOTE — Progress Notes (Signed)
Quick Note:  Called and spoke with patient's daughter. Informed her of recs. Her daughter states that her mother is not currently taking any thyroid medication. She stated she will bring her mother for repeat labs this week. Nothing further needed. Labs have been placed and TP is aware. ______

## 2015-11-22 NOTE — Assessment & Plan Note (Signed)
Labs today.  Use Trazodone At bedtime  Cautiously as may make you off balance.  Follow up Dr. Kriste BasqueNadel  In 3 months and As needed   Please contact office for sooner follow up if symptoms do not improve or worsen or seek emergency care

## 2015-11-22 NOTE — Addendum Note (Signed)
Addended by: Karalee HeightOX, Haislee Corso P on: 11/22/2015 11:10 AM   Modules accepted: Orders, Medications

## 2015-11-22 NOTE — Patient Instructions (Addendum)
You need to gradually increase your exercise program to build strength, stamina, balance, etc... Labs today.  Use Trazodone At bedtime  Cautiously as may make you off balance.  Follow up Dr. Kriste BasqueNadel  In 3 months and As needed   Please contact office for sooner follow up if symptoms do not improve or worsen or seek emergency care

## 2015-11-22 NOTE — Addendum Note (Signed)
Addended by: Karalee HeightOX, Kengo Sturges P on: 11/22/2015 11:00 AM   Modules accepted: Orders

## 2015-11-22 NOTE — Progress Notes (Signed)
Subjective:    Patient ID: [name], female    DOB: 1923-03-20, 82 y.o.   MRN: [id number]  HPI 82 yo female with HTN , DM and DJD   Jun 06, 202017 Acute OV  Pt presents for an acute office visit. Pt is accompanied by her daughter .  She complains that her mom is not sleeping well. Was called in trazodone As needed  For insominia  Has used it a few times but unable to tell if it helps or not.  Does admit she gets up a lot at night to go to BR and does eat some.  We discussed a heathly sleep regimen.  Family wants her to exercise more, encouraged pt to be  More active..  Labs and cxr in Sept nml  Discussed using trazodone cautiously as do not want her to fall at night.  Advised to stop tylenol pm.  Feels more fatigued , last tsh in 2014 nml  No chest pain , orthopnea , dyspnea, syncope or palpitations.   Past Medical History  Diagnosis Date  . Shortness of breath   . Unspecified essential hypertension   . Right bundle branch block   . Other and unspecified hyperlipidemia   . Other abnormal glucose   . Diverticulosis of colon (without mention of hemorrhage)   . Pyelonephritis, unspecified   . Solitary cyst of breast   . Osteoarthrosis, unspecified whether generalized or localized, unspecified site   . Lumbago   . Spinal stenosis, unspecified region other than cervical   . Anemia, unspecified   . GERD (gastroesophageal reflux disease)   . Pyelonephritis   . DJD (degenerative joint disease)   . UTI (lower urinary tract infection)    Current Outpatient Prescriptions on File Prior to Visit  Medication Sig Dispense Refill  . ALPRAZolam (XANAX) 0.25 MG tablet Take 1 tablet by mouth twice daily 60 tablet 0  . aspirin EC 81 MG tablet Take 81 mg by mouth daily.    . Cholecalciferol (VITAMIN D3) 2000 UNITS capsule Take 1 capsule (2,000 Units total) by mouth daily. 30 capsule 6  . Fluticasone-Salmeterol (ADVAIR DISKUS) 100-50 MCG/DOSE AEPB Inhale 1 puff into the lungs 2 (two)  times daily. 60 each 6  . hydrochlorothiazide (MICROZIDE) 12.5 MG capsule Take 1 capsule (12.5 mg total) by mouth daily. 30 capsule 5  . magnesium hydroxide (MILK OF MAGNESIA) 800 MG/5ML suspension Take 30 mLs by mouth daily as needed for constipation.    . Multiple Vitamin (MULTIVITAMIN WITH MINERALS) TABS tablet Take 1 tablet by mouth daily. Centrum Silver    . NORVASC 5 MG tablet TAKE 1 TABLET ONCE DAILY. 30 tablet 6  . polyethylene glycol (MIRALAX / GLYCOLAX) packet Take 17 g by mouth daily as needed for mild constipation.     . potassium chloride (K-DUR,KLOR-CON) 10 MEQ tablet Take 2 tablets (20 mEq total) by mouth daily. 30 tablet 1  . PROAIR HFA 108 (90 BASE) MCG/ACT inhaler INHALE 2 PUFFS EVERY 4 HOURS AS NEEDED 8.5 g 0  . ranitidine (ZANTAC) 150 MG tablet Take 150 mg by mouth daily.    . sertraline (ZOLOFT) 50 MG tablet TAKE (1) TABLET BY MOUTH AT BEDTIME. 30 tablet 5  . Spacer/Aero-Holding Chambers (AEROCHAMBER PLUS WITH MASK) inhaler Use as instructed 1 each 2  . traZODone (DESYREL) 50 MG tablet Take 1 tablet (50 mg total) by mouth at bedtime as needed for sleep. 30 tablet 1  . Alum & Mag Hydroxide-Simeth (MAGIC MOUTHWASH W/LIDOCAINE) SOLN Take  5 mLs by mouth 4 (four) times daily as needed for mouth pain. (Patient not taking: Reported on 11/22/2015) 240 mL 5  . diphenhydramine-acetaminophen (TYLENOL PM) 25-500 MG TABS tablet Take 1 tablet by mouth at bedtime as needed. (Patient not taking: Reported on 11/22/2015) 30 tablet 3  . ferrous sulfate 325 (65 FE) MG tablet Take 325 mg by mouth daily with breakfast. Reported on 11/22/2015    . furosemide (LASIX) 20 MG tablet Take 1 tablet (20 mg total) by mouth daily. (Patient not taking: Reported on 10/24/2015) 30 tablet 6  . OVER THE COUNTER MEDICATION Take 1 application by mouth daily as needed (sinus congestion). Reported on 11/22/2015    . predniSONE (DELTASONE) 10 MG tablet Take 5 mg by mouth See admin instructions. Reported on 11/22/2015     No  current facility-administered medications on file prior to visit.      Review of Systems Constitutional:   No  weight loss, night sweats,  Fevers, chills, + fatigue, or  lassitude.  HEENT:   No headaches,  Difficulty swallowing,  Tooth/dental problems, or  Sore throat,                No sneezing, itching, ear ache, nasal congestion, post nasal drip,   CV:  No chest pain,  Orthopnea, PND, swelling in lower extremities, anasarca, dizziness, palpitations, syncope.   GI  No heartburn, indigestion, abdominal pain, nausea, vomiting, diarrhea, change in bowel habits, loss of appetite, bloody stools.   Resp: No shortness of breath with exertion or at rest.  No excess mucus, no productive cough,  No non-productive cough,  No coughing up of blood.  No change in color of mucus.  No wheezing.  No chest wall deformity  Skin: no rash or lesions.  GU: no dysuria, change in color of urine, no urgency or frequency.  No flank pain, no hematuria   MS:  No joint pain or swelling.  No decreased range of motion.  No back pain.  Psych:  No change in mood or affect. No depression or anxiety.  No memory loss.         Objective:   Physical Exam  Filed Vitals:   11/22/15 1026  BP: 120/66  Pulse: 76  Temp: 97.6 F (36.4 C)  TempSrc: Oral  Height: 5\' 8"  (1.727 m)  Weight: 184 lb (83.462 kg)  SpO2: 93%    GEN: A/Ox3; pleasant , NAD, eldelry walks with walker   HEENT:  [place]/AT,  EACs-clear, TMs-wnl, NOSE-clear, THROAT-clear, no lesions, no postnasal drip or exudate noted.   NECK:  Supple w/ fair ROM; no JVD; normal carotid impulses w/o bruits; no thyromegaly or nodules palpated; no lymphadenopathy.  RESP  Clear  P & A; w/o, wheezes/ rales/ or rhonchi.no accessory muscle use, no dullness to percussion  CARD:  RRR, no m/r/g  ,tr-1 + peripheral edema, pulses intact, no cyanosis or clubbing.  GI:   Soft & nt; nml bowel sounds; no organomegaly or masses detected.  Musco: Warm bil, no deformities  or joint swelling noted.   Neuro: alert, no focal deficits noted.    Skin: Warm, no lesions or rashes         Assessment & Plan:

## 2015-11-22 NOTE — Assessment & Plan Note (Signed)
Check TSH today  Recent labs reviewed in Sept were nml  Suspect is multifactoral , advance act as tolerated

## 2015-11-23 ENCOUNTER — Other Ambulatory Visit (INDEPENDENT_AMBULATORY_CARE_PROVIDER_SITE_OTHER): Payer: Medicare Other

## 2015-11-23 DIAGNOSIS — R5383 Other fatigue: Secondary | ICD-10-CM

## 2015-11-23 LAB — T4, FREE: Free T4: 1.7 ng/dL — ABNORMAL HIGH (ref 0.60–1.60)

## 2015-11-23 LAB — TSH: TSH: 0.03 u[IU]/mL — ABNORMAL LOW (ref 0.35–4.50)

## 2015-11-23 LAB — T3, FREE: T3 FREE: 4 pg/mL (ref 2.3–4.2)

## 2015-11-25 ENCOUNTER — Telehealth: Payer: Self-pay | Admitting: Adult Health

## 2015-11-25 DIAGNOSIS — R7989 Other specified abnormal findings of blood chemistry: Secondary | ICD-10-CM

## 2015-11-25 NOTE — Telephone Encounter (Signed)
Result Note     It appears Thyroid may be overactive.     Needs referral to Endocrinology.     Please place referral    ---   Spoke with [name]. Aware of results. referral placed. Nothing further needed

## 2015-11-25 NOTE — Telephone Encounter (Signed)
Refer to Endocrinology , please see lab results.

## 2015-11-25 NOTE — Telephone Encounter (Signed)
Patient's caretaker, [name], calling to get results on patient's thyroid labs.  TP - please advise.

## 2015-11-29 ENCOUNTER — Telehealth: Payer: Self-pay | Admitting: Pulmonary Disease

## 2015-11-29 MED ORDER — CETIRIZINE HCL 10 MG PO TABS: 10.0000 mg | ORAL_TABLET | Freq: Every day | ORAL | Status: DC

## 2015-11-29 MED ORDER — FLUTICASONE PROPIONATE 50 MCG/ACT NA SUSP: 2.0000 | Freq: Every day | NASAL | Status: DC

## 2015-11-29 NOTE — Telephone Encounter (Signed)
Per SN: Ok to use Zyrtec 10 mg 1 tab daily and generic flonase 2 spr each nostril bid.  Both are otc.   Spoke with pt's daughter, states she is unsure if pt can take otc meds without an order from the provider as the pt is in a nursing home.  Pt's daughter will call nursing home and see if she can pick up these meds to take to pt, or if [facility] need [facility] [place]. Will await call.

## 2015-11-29 NOTE — Telephone Encounter (Signed)
Pt's daughter [name] called back and reported that pt's otc meds do need to be sent to [facility] in [place]. This has been done Nothing further needed; will sign off

## 2015-11-29 NOTE — Telephone Encounter (Signed)
Spoke with Medco Health SolutionsShirley. States that the pt is getting a cold. Reports increased sneezing and runny nose. Denies PND, coughing or fever. Has been given anything over the counter. Would like SN's recommendations.  Allergies  Allergen Reactions  . Azithromycin Shortness Of Breath  . Ciprofloxacin Other (See Comments)     hallucinations  . Levofloxacin Other (See Comments)    Insomnia, indigestion, tingling sensation in legs  . Latex Rash  . Other Rash    EKG leads caused a rash that required steroids to clear    SN - please advise. Thanks.

## 2015-12-01 ENCOUNTER — Encounter: Payer: Self-pay | Admitting: Internal Medicine

## 2015-12-01 ENCOUNTER — Ambulatory Visit (INDEPENDENT_AMBULATORY_CARE_PROVIDER_SITE_OTHER): Payer: Medicare Other | Admitting: Internal Medicine

## 2015-12-01 VITALS — BP 112/48 | HR 86 | Temp 97.9°F | Resp 12 | Wt 184.0 lb

## 2015-12-01 DIAGNOSIS — E0590 Thyrotoxicosis, unspecified without thyrotoxic crisis or storm: Secondary | ICD-10-CM

## 2015-12-01 DIAGNOSIS — E0580 Other thyrotoxicosis without thyrotoxic crisis or storm: Secondary | ICD-10-CM

## 2015-12-01 LAB — TSH: TSH: 0.09 u[IU]/mL — AB (ref 0.35–4.50)

## 2015-12-01 LAB — T3, FREE: T3 FREE: 3.8 pg/mL (ref 2.3–4.2)

## 2015-12-01 LAB — T4, FREE: FREE T4: 1.47 ng/dL (ref 0.60–1.60)

## 2015-12-01 LAB — SEDIMENTATION RATE: SED RATE: 14 mm/h (ref 0–22)

## 2015-12-01 NOTE — Patient Instructions (Signed)
Please stop at the lab.  Please come back for a follow-up appointment in 3 months.   Hyperthyroidism Hyperthyroidism is when the thyroid is too active (overactive). Your thyroid is a large gland that is located in your neck. The thyroid helps to control how your body uses food (metabolism). When your thyroid is overactive, it produces too much of a hormone called thyroxine.  CAUSES Causes of hyperthyroidism may include:  Graves disease. This is when your immune system attacks the thyroid gland. This is the most common cause.  Inflammation of the thyroid gland.  Tumor in the thyroid gland or somewhere else.  Excessive use of thyroid medicines, including:  Prescription thyroid supplement.  Herbal supplements that mimic thyroid hormones.  Solid or fluid-filled lumps within your thyroid gland (thyroid nodules).  Excessive ingestion of iodine. RISK FACTORS  Being female.  Having a family history of thyroid conditions. SIGNS AND SYMPTOMS Signs and symptoms of hyperthyroidism may include:  Nervousness.  Inability to tolerate heat.  Unexplained weight loss.  Diarrhea.  Change in the texture of hair or skin.  Heart skipping beats or making extra beats.  Rapid heart rate.  Loss of menstruation.  Shaky hands.  Fatigue.  Restlessness.  Increased appetite.  Sleep problems.  Enlarged thyroid gland or nodules. DIAGNOSIS  Diagnosis of hyperthyroidism may include:  Medical history and physical exam.  Blood tests.  Ultrasound tests. TREATMENT Treatment may include:  Medicines to control your thyroid.  Surgery to remove your thyroid.  Radiation therapy. HOME CARE INSTRUCTIONS   Take medicines only as directed by your health care provider.  Do not use any tobacco products, including cigarettes, chewing tobacco, or electronic cigarettes. If you need help quitting, ask your health care provider.  Do not exercise or do physical activity until your health  care provider approves.  Keep all follow-up appointments as directed by your health care provider. This is important. SEEK MEDICAL CARE IF:  Your symptoms do not get better with treatment.  You have fever.  You are taking thyroid replacement medicine and you:  Have depression.  Feel mentally and physically slow.  Have weight gain. SEEK IMMEDIATE MEDICAL CARE IF:   You have decreased alertness or a change in your awareness.  You have abdominal pain.  You feel dizzy.  You have a rapid heartbeat.  You have an irregular heartbeat.   This information is not intended to replace advice given to you by your health care provider. Make sure you discuss any questions you have with your health care provider.   Document Released: 11/05/2005 Document Revised: 11/26/2014 Document Reviewed: 03/23/2014 Elsevier Interactive Patient Education 2016 [employer]   

## 2015-12-01 NOTE — Progress Notes (Signed)
Patient ID: [name], female   DOB: 1923/08/31, 82 y.o.   MRN: [id number]   HPI  [name] is a 82 y.o.-year-old female, referred by her PCP, Dr. Lenna Gilford, for evaluation for thyrotoxicosis. She is here with her daughter [name]) who is also my pt.   Pt developed anxiety attacks/panic attacks/sundowning >> in ED 6x since last summer per daughter's report. She also gets SOB with any activity. She c/o increased appetite and has insomnia. She has nocturia and leg swelling. She is now on Xanax >> helping some, and Trazodone >> not helping much.  I reviewed pt's thyroid tests: Lab Results  Component Value Date   TSH 0.03* 11/23/2015   TSH 0.05 Repeated and verified X2.* 24-Oct-202017   TSH 1.93 02/10/2013   TSH 2.86 01/17/2012   TSH 1.53 05/14/2011   TSH 2.42 12/13/2008   TSH 1.84 10/28/2007   TSH 2.74 12/27/2006   FREET4 1.70* 11/23/2015    Pt denies feeling nodules in neck, hoarseness, dysphagia/odynophagia, SOB with lying down; she c/o: - + fatigue - no excessive sweating/heat intolerance - + tremors (newer) - + anxiety - no palpitations - no hyperdefecation - no weight loss - no hair loss  Pt does not have a FH of thyroid ds. No FH of thyroid cancer. No h/o radiation tx to head or neck.  No seaweed or kelp, no recent contrast studies. No steroid use. No herbal supplements. No Biotin use.  I reviewed her chart and she also has a history of bullous pemphigoid >> on Prednisone >> now off.  ROS: Constitutional: see HPI, + nocturia Eyes: no blurry vision, no xerophthalmia ENT: no sore throat, no nodules palpated in throat, no dysphagia/odynophagia, no hoarseness, + tinnitus Cardiovascular: no CP/+ SOB/no palpitations/+ leg swelling Respiratory: no cough/+ SOB Gastrointestinal: no N/V/D/+ C, + heartburn Musculoskeletal: + muscle aches/+ joint aches Skin: + rash, + itching, + easy bruising Neurological: + tremors/no numbness/tingling/dizziness Psychiatric: +  depression/+ anxiety  Past Medical History  Diagnosis Date  . Shortness of breath   . Unspecified essential hypertension   . Right bundle branch block   . Other and unspecified hyperlipidemia   . Other abnormal glucose   . Diverticulosis of colon (without mention of hemorrhage)   . Pyelonephritis, unspecified   . Solitary cyst of breast   . Osteoarthrosis, unspecified whether generalized or localized, unspecified site   . Lumbago   . Spinal stenosis, unspecified region other than cervical   . Anemia, unspecified   . GERD (gastroesophageal reflux disease)   . Pyelonephritis   . DJD (degenerative joint disease)   . UTI (lower urinary tract infection)    Past Surgical History  Procedure Laterality Date  . Cataract extraction    . Right hip hemiarthroplasty      total  . Conversion to right thr     Social History   Social History  . Marital Status: Divorced    Spouse Name: N/A  . Number of Children: 3- 1 deceased   Occupational History  . retired    Social History Main Topics  . Smoking status: Never Smoker   . Smokeless tobacco: Never Used  . Alcohol Use: No  . Drug Use: No   Pt lives in [place].  Current Outpatient Prescriptions on File Prior to Visit  Medication Sig Dispense Refill  . ALPRAZolam (XANAX) 0.25 MG tablet Take 1 tablet by mouth twice daily 60 tablet 0  . Alum & Mag Hydroxide-Simeth (MAGIC MOUTHWASH W/LIDOCAINE)  SOLN Take 5 mLs by mouth 4 (four) times daily as needed for mouth pain. 240 mL 5  . aspirin EC 81 MG tablet Take 81 mg by mouth daily.    . cetirizine (ZYRTEC ALLERGY) 10 MG tablet Take 1 tablet (10 mg total) by mouth daily. 30 tablet 5  . Cholecalciferol (VITAMIN D3) 2000 UNITS capsule Take 1 capsule (2,000 Units total) by mouth daily. 30 capsule 6  . ferrous sulfate 325 (65 FE) MG tablet Take 325 mg by mouth daily with breakfast. Reported on 11/22/2015    . fluticasone (FLONASE) 50 MCG/ACT nasal spray Place 2 sprays into both nostrils  daily. 16 g 5  . Fluticasone-Salmeterol (ADVAIR DISKUS) 100-50 MCG/DOSE AEPB Inhale 1 puff into the lungs 2 (two) times daily. 60 each 6  . hydrochlorothiazide (MICROZIDE) 12.5 MG capsule Take 1 capsule (12.5 mg total) by mouth daily. 30 capsule 5  . magnesium hydroxide (MILK OF MAGNESIA) 800 MG/5ML suspension Take 30 mLs by mouth daily as needed for constipation.    . Multiple Vitamin (MULTIVITAMIN WITH MINERALS) TABS tablet Take 1 tablet by mouth daily. Centrum Silver    . NORVASC 5 MG tablet TAKE 1 TABLET ONCE DAILY. 30 tablet 6  . OVER THE COUNTER MEDICATION Take 1 application by mouth daily as needed (sinus congestion). Reported on 11/22/2015    . polyethylene glycol (MIRALAX / GLYCOLAX) packet Take 17 g by mouth daily as needed for mild constipation.     . potassium chloride (K-DUR,KLOR-CON) 10 MEQ tablet Take 2 tablets (20 mEq total) by mouth daily. 30 tablet 1  . PROAIR HFA 108 (90 BASE) MCG/ACT inhaler INHALE 2 PUFFS EVERY 4 HOURS AS NEEDED 8.5 g 0  . ranitidine (ZANTAC) 150 MG tablet Take 150 mg by mouth daily.    . sertraline (ZOLOFT) 50 MG tablet TAKE (1) TABLET BY MOUTH AT BEDTIME. 30 tablet 5  . Spacer/Aero-Holding Chambers (AEROCHAMBER PLUS WITH MASK) inhaler Use as instructed 1 each 2  . traZODone (DESYREL) 50 MG tablet Take 1 tablet (50 mg total) by mouth at bedtime as needed for sleep. 30 tablet 1  . predniSONE (DELTASONE) 10 MG tablet Take 5 mg by mouth See admin instructions. Reported on 12/01/2015     No current facility-administered medications on file prior to visit.   Allergies  Allergen Reactions  . Azithromycin Shortness Of Breath  . Ciprofloxacin Other (See Comments)     hallucinations  . Levofloxacin Other (See Comments)    Insomnia, indigestion, tingling sensation in legs  . Furosemide Rash    Bullous pemphigoid  . Latex Rash  . Other Rash    EKG leads caused a rash that required steroids to clear   Family History  Problem Relation Age of Onset  . Cancer  Brother   . Cancer Brother    PE: BP 112/48 mmHg  Pulse 86  Temp(Src) 97.9 F (36.6 C) (Oral)  Resp 12  Wt 184 lb (83.462 kg)  SpO2 94% Body mass index is 27.98 kg/(m^2). Wt Readings from Last 3 Encounters:  12/01/15 184 lb (83.462 kg)  11/22/15 184 lb (83.462 kg)  10/24/15 179 lb 3.2 oz (81.285 kg)   Constitutional: overweight, in NAD Eyes: PERRLA, EOMI, no exophthalmos, no lid lag, no stare ENT: moist mucous membranes, no thyromegaly, no thyroid bruits, no cervical lymphadenopathy Cardiovascular: RRR, + 2/6 SEM, no RG, + B LE pitting edema +2 Respiratory: CTA B Gastrointestinal: abdomen soft, NT, ND, BS+ Musculoskeletal: no deformities, strength intact in all  4 Skin: moist, warm,+ stasis dermatitis B legs Neurological: + tremor with outstretched hands, DTR normal in all 4  ASSESSMENT: 1. Thyrotoxicosis  PLAN:  1. Patient with a recently found low TSH + high fT4, with thyrotoxic sxs: anxiety, tremors, poor sleep.  - she does not appear to have exogenous causes for the low TSH.  - We discussed that possible causes of thyrotoxicosis are:  Graves ds   Thyroiditis toxic multinodular goiter/ toxic adenoma - I suggested that we check the TSH, fT3 and fT4 and also add thyroid stimulating antibodies to screen for Graves' disease and ESR to screen for thyroiditis. - If the tests remain abnormal, we may need an uptake and scan to differentiate between the 3 above possible etiologies; however, due to age, we may skip the test  - we discussed about possible modalities of treatment for the above conditions, to include methimazole use, radioactive iodine ablation or (last resort) surgery. Due to age, I would likely use MMI for possible Graves disease, and she and daughter agree. - I do not feel that we need to add beta blockers at this time, since she is not tachycardic. - I advised her to join my chart to communicate easier - RTC in 3 months, but likely sooner for repeat labs  Will  need to call Dtr with results: [phone] [name]).  Component     Latest Ref Rng 12/01/2015  TSH     0.35 - 4.50 uIU/mL 0.09 (L)  T3, Free     2.3 - 4.2 pg/mL 3.8  Free T4     0.60 - 1.60 ng/dL 1.47  TSI     <140 % baseline 88  Sed Rate     0 - 22 mm/hr 14   TSH still low, with normal free T4 and free T3. Differential diagnosis is between mild Graves' disease (Graves antibodies are not elevated) and toxic adenoma. Due to age, I would suggest to try a low dose of methimazole to see if her symptoms improve, but hold off further testing. Will start 5 mg of methimazole daily and recheck her thyroid tests in 5-6 weeks. If her condition does not improve on methimazole, I will have a low threshold for a thyroid uptake and scan. I reviewed her white blood cell count and LFTs from 07/2015 and these are normal.

## 2015-12-05 LAB — THYROID STIMULATING IMMUNOGLOBULIN: TSI: 88 %{baseline} (ref <140)

## 2015-12-07 DIAGNOSIS — E0590 Thyrotoxicosis, unspecified without thyrotoxic crisis or storm: Secondary | ICD-10-CM | POA: Insufficient documentation

## 2015-12-07 MED ORDER — METHIMAZOLE 5 MG PO TABS: 5.0000 mg | ORAL_TABLET | Freq: Three times a day (TID) | ORAL | Status: DC

## 2015-12-08 ENCOUNTER — Other Ambulatory Visit: Payer: Self-pay | Admitting: Internal Medicine

## 2015-12-08 ENCOUNTER — Ambulatory Visit: Payer: Medicare Other | Admitting: Internal Medicine

## 2015-12-08 ENCOUNTER — Telehealth: Payer: Self-pay | Admitting: Internal Medicine

## 2015-12-08 DIAGNOSIS — E0590 Thyrotoxicosis, unspecified without thyrotoxic crisis or storm: Secondary | ICD-10-CM

## 2015-12-08 MED ORDER — METHIMAZOLE 5 MG PO TABS: 5.0000 mg | ORAL_TABLET | Freq: Every day | ORAL | Status: DC

## 2015-12-08 NOTE — Telephone Encounter (Signed)
Called pt's daughter and advised her per Dr Charlean Sanfilippo note. She stated that the pt takes the med 3 times a day. She will advise the facility when she goes there this evening to have pt take with food. She will let us know on Monday if the sx do not improve. Be advised.

## 2015-12-08 NOTE — Telephone Encounter (Signed)
Oh, so sorry for the confusion! It should be once a day!

## 2015-12-08 NOTE — Telephone Encounter (Signed)
No, this does not sound like a side effect of Methimazole... She takes it with a meal, right? Ideally in am. Let's try to continue this but if this keeps happening, we may need to stop the med at least for few days to see if the sxs improve.

## 2015-12-08 NOTE — Telephone Encounter (Signed)
Patient daugther stated that methimzole is making her sick, please advise

## 2015-12-08 NOTE — Telephone Encounter (Signed)
Returned pt's daughters call. She stated pt has a nurse that sits with her. She did not sleep all night, morning nurse said that she has been trembling, nauseated and only ate a little oatmeal this AM and some chix broth at lunch. Please advise if this is a normal side effect to the medication and what should they do?

## 2015-12-09 ENCOUNTER — Telehealth: Payer: Self-pay | Admitting: Internal Medicine

## 2015-12-09 ENCOUNTER — Other Ambulatory Visit: Payer: Self-pay | Admitting: *Deleted

## 2015-12-09 MED ORDER — METHIMAZOLE 5 MG PO TABS: 5.0000 mg | ORAL_TABLET | Freq: Every day | ORAL | Status: DC

## 2015-12-09 NOTE — Telephone Encounter (Signed)
Called pt's daughter and advised her. New rx sent to pt's pharmacy as well.

## 2015-12-09 NOTE — Telephone Encounter (Signed)
Patient Name: [name] Gender: Female DOB: 11-02-1923 Age: 82 Y 9 M 27 D Return Phone Number: Address: City/State/Zip: [place] [facility] Night - Client Client Site [facility] Carlus Pavlov Contact Type Call Caller Name Theresa Mulligan Caller Phone Number [phone] Relationship To Patient Daughter Is this call to report lab results? No Call Type General Information Initial Comment Caller states her mother's RX is not correct so she needs Dr. Elvera Lennox to call the pharmacy [facility]) to correct. Said she will just call back in morning. General Information Type Message Only Nurse Assessment Guidelines Guideline Title Affirmed Question Affirmed Notes Nurse Date/Time (Eastern Time) Disp. Time Lamount Cohen Time) Disposition Final User 12/08/2015 5:50:17 PM General Information Provided Yes Ruthell Rummage After Care Instructions Given Call Event Type User Date / Time Description

## 2015-12-14 ENCOUNTER — Telehealth: Payer: Self-pay | Admitting: Pulmonary Disease

## 2015-12-14 MED ORDER — TORSEMIDE 20 MG PO TABS: 20.0000 mg | ORAL_TABLET | Freq: Every day | ORAL | Status: DC

## 2015-12-14 NOTE — Telephone Encounter (Signed)
Per SN: needs to take Demadex  1 QD am; #30, RF: PRN; pt allergic to Lasix, this drug is not the same as lasix per SN.

## 2015-12-14 NOTE — Telephone Encounter (Signed)
Called and spoke to pt's daughter, Talbert Forest. Informed her of the recs per SN, rx sent to preferred pharmacy. Pt verbalized understanding and denied any further questions or concerns at this time.

## 2015-12-14 NOTE — Telephone Encounter (Signed)
Spoke with [name], states pt's legs (b/l) are swelling from the knee down to her feet Xseveral months-states that HCTZ is not helping.   Pt has been elevating legs in a recliner, eats a low-sodium diet.  Talbert Forest does note that pt started a new medication a few weeks ago- Tapazole  qam.  [facility] uses [facility] in [place].   Talbert Forest requesting further recs for pt- SN please advise on recs.  Thanks!   Allergies  Allergen Reactions  . Azithromycin Shortness Of Breath  . Ciprofloxacin Other (See Comments)     hallucinations  . Levofloxacin Other (See Comments)    Insomnia, indigestion, tingling sensation in legs  . Furosemide Rash    Bullous pemphigoid  . Latex Rash  . Other Rash    EKG leads caused a rash that required steroids to clear   Current Outpatient Prescriptions on File Prior to Visit  Medication Sig Dispense Refill  . ALPRAZolam (XANAX) 0.25 MG tablet Take 1 tablet by mouth twice daily 60 tablet 0  . Alum & Mag Hydroxide-Simeth (MAGIC MOUTHWASH W/LIDOCAINE) SOLN Take 5 mLs by mouth 4 (four) times daily as needed for mouth pain. 240 mL 5  . aspirin EC 81 MG tablet Take 81 mg by mouth daily.    . cetirizine (ZYRTEC ALLERGY) 10 MG tablet Take 1 tablet (10 mg total) by mouth daily. 30 tablet 5  . Cholecalciferol (VITAMIN D3) 2000 UNITS capsule Take 1 capsule (2,000 Units total) by mouth daily. 30 capsule 6  . ferrous sulfate 325 (65 FE) MG tablet Take 325 mg by mouth daily with breakfast. Reported on 11/22/2015    . fluticasone (FLONASE) 50 MCG/ACT nasal spray Place 2 sprays into both nostrils daily. 16 g 5  . Fluticasone-Salmeterol (ADVAIR DISKUS) 100-50 MCG/DOSE AEPB Inhale 1 puff into the lungs 2 (two) times daily. 60 each 6  . hydrochlorothiazide (MICROZIDE) 12.5 MG capsule Take 1 capsule (12.5 mg total) by mouth daily. 30 capsule 5  . magnesium hydroxide (MILK OF MAGNESIA) 800 MG/5ML suspension Take 30 mLs by mouth daily as needed for constipation.    . methimazole  (TAPAZOLE) 5 MG tablet Take 1 tablet (5 mg total) by mouth daily. 60 tablet 2  . Multiple Vitamin (MULTIVITAMIN WITH MINERALS) TABS tablet Take 1 tablet by mouth daily. Centrum Silver    . NORVASC 5 MG tablet TAKE 1 TABLET ONCE DAILY. 30 tablet 6  . OVER THE COUNTER MEDICATION Take 1 application by mouth daily as needed (sinus congestion). Reported on 11/22/2015    . polyethylene glycol (MIRALAX / GLYCOLAX) packet Take 17 g by mouth daily as needed for mild constipation.     . potassium chloride (K-DUR,KLOR-CON) 10 MEQ tablet Take 2 tablets (20 mEq total) by mouth daily. 30 tablet 1  . predniSONE (DELTASONE) 10 MG tablet Take 5 mg by mouth See admin instructions. Reported on 12/01/2015    . PROAIR HFA 108 (90 BASE) MCG/ACT inhaler INHALE 2 PUFFS EVERY 4 HOURS AS NEEDED 8.5 g 0  . ranitidine (ZANTAC) 150 MG tablet Take 150 mg by mouth daily.    . sertraline (ZOLOFT) 50 MG tablet TAKE (1) TABLET BY MOUTH AT BEDTIME. 30 tablet 5  . Spacer/Aero-Holding Chambers (AEROCHAMBER PLUS WITH MASK) inhaler Use as instructed 1 each 2  . traZODone (DESYREL) 50 MG tablet Take 1 tablet (50 mg total) by mouth at bedtime as needed for sleep. 30 tablet 1   No current facility-administered medications on file prior to visit.

## 2015-12-27 ENCOUNTER — Telehealth: Payer: Self-pay | Admitting: Internal Medicine

## 2015-12-27 NOTE — Telephone Encounter (Signed)
Patient daughter would like to know if patient need to fast for her lab appointment, please advise

## 2015-12-27 NOTE — Telephone Encounter (Signed)
Called pt's daughter, Talbert Forest and advised her that she does not need to be fasting the day of her lab appt. She voiced understanding.

## 2015-12-28 ENCOUNTER — Telehealth: Payer: Self-pay | Admitting: Pulmonary Disease

## 2015-12-28 NOTE — Telephone Encounter (Signed)
Spoke with the pt's daughter  She states that pt the demadex we recently started pt on is only helping temporarily She is c/o "throbbing" in her legs  She also has multiple other co's- SOB, irritable, SOB, shaking  She states that she thought that pt me be having s/e from tapazole which was started by endo, but when she called Dr Wyonia Hough she was advised to call us for appt with SN  OV with SN for tomorrow at 10:30 am, advised her to have pt seek emergent care sooner if needed

## 2015-12-29 ENCOUNTER — Other Ambulatory Visit (INDEPENDENT_AMBULATORY_CARE_PROVIDER_SITE_OTHER): Payer: Medicare Other

## 2015-12-29 ENCOUNTER — Encounter: Payer: Self-pay | Admitting: Pulmonary Disease

## 2015-12-29 ENCOUNTER — Ambulatory Visit (INDEPENDENT_AMBULATORY_CARE_PROVIDER_SITE_OTHER): Payer: Medicare Other | Admitting: Pulmonary Disease

## 2015-12-29 VITALS — BP 116/58 | HR 68 | Temp 98.0°F | Ht 68.0 in | Wt 178.6 lb

## 2015-12-29 DIAGNOSIS — I4510 Unspecified right bundle-branch block: Secondary | ICD-10-CM

## 2015-12-29 DIAGNOSIS — R609 Edema, unspecified: Secondary | ICD-10-CM | POA: Diagnosis not present

## 2015-12-29 DIAGNOSIS — I10 Essential (primary) hypertension: Secondary | ICD-10-CM

## 2015-12-29 DIAGNOSIS — E0580 Other thyrotoxicosis without thyrotoxic crisis or storm: Secondary | ICD-10-CM

## 2015-12-29 DIAGNOSIS — M150 Primary generalized (osteo)arthritis: Secondary | ICD-10-CM

## 2015-12-29 DIAGNOSIS — I872 Venous insufficiency (chronic) (peripheral): Secondary | ICD-10-CM

## 2015-12-29 DIAGNOSIS — F419 Anxiety disorder, unspecified: Secondary | ICD-10-CM

## 2015-12-29 DIAGNOSIS — D649 Anemia, unspecified: Secondary | ICD-10-CM

## 2015-12-29 DIAGNOSIS — M159 Polyosteoarthritis, unspecified: Secondary | ICD-10-CM

## 2015-12-29 LAB — CBC WITH DIFFERENTIAL/PLATELET
BASOS ABS: 0.1 10*3/uL (ref 0.0–0.1)
Basophils Relative: 1.3 % (ref 0.0–3.0)
EOS PCT: 5.4 % — AB (ref 0.0–5.0)
Eosinophils Absolute: 0.4 10*3/uL (ref 0.0–0.7)
HEMATOCRIT: 44.7 % (ref 36.0–46.0)
HEMOGLOBIN: 14.6 g/dL (ref 12.0–15.0)
LYMPHS PCT: 26.6 % (ref 12.0–46.0)
Lymphs Abs: 2 10*3/uL (ref 0.7–4.0)
MCHC: 32.6 g/dL (ref 30.0–36.0)
MCV: 88.1 fl (ref 78.0–100.0)
MONOS PCT: 10.6 % (ref 3.0–12.0)
Monocytes Absolute: 0.8 10*3/uL (ref 0.1–1.0)
Neutro Abs: 4.2 10*3/uL (ref 1.4–7.7)
Neutrophils Relative %: 56.1 % (ref 43.0–77.0)
Platelets: 232 10*3/uL (ref 150.0–400.0)
RBC: 5.08 Mil/uL (ref 3.87–5.11)
RDW: 12.8 % (ref 11.5–15.5)
WBC: 7.4 10*3/uL (ref 4.0–10.5)

## 2015-12-29 LAB — IBC PANEL
Iron: 99 ug/dL (ref 42–145)
SATURATION RATIOS: 31.4 % (ref 20.0–50.0)
Transferrin: 225 mg/dL (ref 212.0–360.0)

## 2015-12-29 LAB — BASIC METABOLIC PANEL
BUN: 21 mg/dL (ref 6–23)
CALCIUM: 10.1 mg/dL (ref 8.4–10.5)
CO2: 39 meq/L — AB (ref 19–32)
Chloride: 86 mEq/L — ABNORMAL LOW (ref 96–112)
Creatinine, Ser: 1.2 mg/dL (ref 0.40–1.20)
GFR: 44.77 mL/min — AB (ref >60.00)
GLUCOSE: 100 mg/dL — AB (ref 70–99)
POTASSIUM: 4 meq/L (ref 3.5–5.1)
SODIUM: 130 meq/L — AB (ref 135–145)

## 2015-12-29 NOTE — Patient Instructions (Signed)
Today we updated your med list in our EPIC system...    Continue your current medications the same...  We removed several meds that she is no longer taking & reconciled the list to [facility]...  Try to get some exercise 7 participate in the exercise classes!!!  Call for any questions...  Let's plan a follow up visit in 27mo, sooner if needed for problems.Marland KitchenMarland Kitchen

## 2015-12-30 ENCOUNTER — Telehealth: Payer: Self-pay | Admitting: Pulmonary Disease

## 2015-12-30 NOTE — Telephone Encounter (Signed)
Result Note     Please notify daughter & we'll need to send new orders to [facility]> [facility]    Labs show that CBC & IRON levels are WNL- looks good...    Chemistries however show sl decr Sodium & elev Bicarb level- needs to [facility] Demadex20 to 1/2 tab Qam AND ADD DIAMOX250mg  one tab po Qd...    (we will need to call in the Diamox & write orders to Fax to [facility], thanks)... REC ROV recheck 6 weeks.   ---  Called spoke with daughter and is aware of recs. I have faxed order over to [facility] at [phone]. Appt scheduled to see SN in 6 weeks.

## 2016-01-10 ENCOUNTER — Other Ambulatory Visit (INDEPENDENT_AMBULATORY_CARE_PROVIDER_SITE_OTHER): Payer: Medicare Other

## 2016-01-10 ENCOUNTER — Telehealth: Payer: Self-pay | Admitting: Pulmonary Disease

## 2016-01-10 DIAGNOSIS — E0580 Other thyrotoxicosis without thyrotoxic crisis or storm: Secondary | ICD-10-CM

## 2016-01-10 LAB — T4, FREE: Free T4: 0.76 ng/dL (ref 0.60–1.60)

## 2016-01-10 LAB — TSH: TSH: 2.39 u[IU]/mL (ref 0.35–4.50)

## 2016-01-10 LAB — T3, FREE: T3, Free: 2.5 pg/mL (ref 2.3–4.2)

## 2016-01-10 MED ORDER — PREDNISONE 10 MG PO TABS: ORAL_TABLET | ORAL | Status: DC

## 2016-01-10 NOTE — Telephone Encounter (Signed)
Talbert Forest returned call [phone]

## 2016-01-10 NOTE — Telephone Encounter (Signed)
Called spoke with daughter and is aware of recs. [facility] sent into [facility]. Nothing further needed

## 2016-01-10 NOTE — Telephone Encounter (Signed)
LMOMTCB for [facility]

## 2016-01-10 NOTE — Telephone Encounter (Signed)
Called spoke with Talbert Forest (pt daughter). She reports pt is coughing up mucus (but unsure what color it is), PND, nasal cong, blowing out clear phlem, wheezing x yesterday. No f/c/s/n/v. Pt has been taking Zyrtec. Wants something called in as pt not able to come in for OV. SN is out all week. Please advise MW thanks

## 2016-01-10 NOTE — Telephone Encounter (Signed)
Prednisone 10 mg take  4 each am x 2 days,   2 each am x 2 days,  1 each am x 2 days and stop  

## 2016-01-10 NOTE — Telephone Encounter (Signed)
Per 01/10/16 phone note: Nyoka Cowden, MD at 01/10/2016 11:20 AM     Status: Signed       Expand All Collapse All   Prednisone 10 mg take 4 each am x 2 days, 2 each am x 2 days, 1 each am x 2 days and stop      ---  Called spoke with daughter. She scheduled appt to see sarah tomorrow at 12pm. Nothing further needed

## 2016-01-11 ENCOUNTER — Ambulatory Visit (INDEPENDENT_AMBULATORY_CARE_PROVIDER_SITE_OTHER)
Admission: RE | Admit: 2016-01-11 | Discharge: 2016-01-11 | Disposition: A | Discharge status: Home / Self Care | Payer: Medicare Other | Source: Ambulatory Visit | Attending: Acute Care | Admitting: Acute Care

## 2016-01-11 ENCOUNTER — Encounter: Payer: Self-pay | Admitting: Acute Care

## 2016-01-11 ENCOUNTER — Other Ambulatory Visit (INDEPENDENT_AMBULATORY_CARE_PROVIDER_SITE_OTHER): Payer: Medicare Other

## 2016-01-11 ENCOUNTER — Ambulatory Visit (INDEPENDENT_AMBULATORY_CARE_PROVIDER_SITE_OTHER): Payer: Medicare Other | Admitting: Acute Care

## 2016-01-11 VITALS — BP 126/78 | HR 92 | Ht 67.5 in | Wt 181.6 lb

## 2016-01-11 DIAGNOSIS — R5383 Other fatigue: Secondary | ICD-10-CM

## 2016-01-11 DIAGNOSIS — R05 Cough: Secondary | ICD-10-CM

## 2016-01-11 DIAGNOSIS — R0602 Shortness of breath: Secondary | ICD-10-CM

## 2016-01-11 DIAGNOSIS — G4700 Insomnia, unspecified: Secondary | ICD-10-CM

## 2016-01-11 DIAGNOSIS — J302 Other seasonal allergic rhinitis: Secondary | ICD-10-CM | POA: Diagnosis not present

## 2016-01-11 DIAGNOSIS — R059 Cough, unspecified: Secondary | ICD-10-CM

## 2016-01-11 DIAGNOSIS — J309 Allergic rhinitis, unspecified: Secondary | ICD-10-CM | POA: Insufficient documentation

## 2016-01-11 LAB — BASIC METABOLIC PANEL
BUN: 21 mg/dL (ref 6–23)
CALCIUM: 9.2 mg/dL (ref 8.4–10.5)
CO2: 28 meq/L (ref 19–32)
CREATININE: 1.26 mg/dL — AB (ref 0.40–1.20)
Chloride: 96 mEq/L (ref 96–112)
GFR: 42.31 mL/min — AB (ref >60.00)
Glucose, Bld: 152 mg/dL — ABNORMAL HIGH (ref 70–99)
Potassium: 4.2 mEq/L (ref 3.5–5.1)
SODIUM: 131 meq/L — AB (ref 135–145)

## 2016-01-11 LAB — CBC WITH DIFFERENTIAL/PLATELET
Basophils Absolute: 0 10*3/uL (ref 0.0–0.1)
Basophils Relative: 0.3 % (ref 0.0–3.0)
EOS PCT: 0 % (ref 0.0–5.0)
Eosinophils Absolute: 0 10*3/uL (ref 0.0–0.7)
HCT: 42 % (ref 36.0–46.0)
Hemoglobin: 14.1 g/dL (ref 12.0–15.0)
LYMPHS ABS: 0.6 10*3/uL — AB (ref 0.7–4.0)
Lymphocytes Relative: 8.4 % — ABNORMAL LOW (ref 12.0–46.0)
MCHC: 33.6 g/dL (ref 30.0–36.0)
MCV: 86.2 fl (ref 78.0–100.0)
MONO ABS: 0.1 10*3/uL (ref 0.1–1.0)
Monocytes Relative: 1.5 % — ABNORMAL LOW (ref 3.0–12.0)
NEUTROS ABS: 6.1 10*3/uL (ref 1.4–7.7)
PLATELETS: 161 10*3/uL (ref 150.0–400.0)
RBC: 4.87 Mil/uL (ref 3.87–5.11)
RDW: 13.7 % (ref 11.5–15.5)
WBC: 6.8 10*3/uL (ref 4.0–10.5)

## 2016-01-11 NOTE — Progress Notes (Signed)
Subjective:    Patient ID: [name], female    DOB: [birth year], 82 y.o.   MRN: [id number]  HPI 82 year old female with history of chronic obstructive asthma/ COPD , bronchitis, hypertension,RBBB, borderline diabetes, and multiple additional medical problems: resides at assisted living facility.  01/11/16:  EXAM: CHEST 2 VIEW  COMPARISON: Chest x-ray of 07/26/2015  FINDINGS: No active infiltrate or effusion is seen. Chronic elevation of the right hemidiaphragm is stable. Mediastinal and hilar contours are unremarkable. The heart is mildly enlarged and stable. There are degenerative changes throughout the thoracic spine.  IMPRESSION: Stable chest x-ray with chronic elevation of the right hemidiaphragm. No active lung disease.  01/11/16: CBC:  Results for SHARALYN, LOMBA (MRN [id number]) as of 01/11/2016 12:31  Ref. Range 01/11/2016 12:48  WBC Latest Ref Range: 4.0-10.5 K/uL 6.8  RBC Latest Ref Range: 3.87-5.11 Mil/uL 4.87  Hemoglobin Latest Ref Range: 12.0-15.0 g/dL 08.6  HCT Latest Ref Range: 36.0-46.0 % 42.0  MCV Latest Ref Range: 78.0-100.0 fl 86.2  MCHC Latest Ref Range: 30.0-36.0 g/dL 57.8  RDW Latest Ref Range: 11.5-15.5 % 13.7  Platelets Latest Ref Range: 150.0-400.0 K/uL 161.0   Results for NOLITA, KUTTER (MRN [id number]) as of 01/11/2016 12:31  Ref. Range 01/11/2016 12:48  Neutrophils Latest Ref Range: 43.0-77.0 % 89.8 Repeated and... (H)  Lymphocytes Latest Ref Range: 12.0-46.0 % 8.4 (L)  Monocytes Relative Latest Ref Range: 3.0-12.0 % 1.5 (L)  Eosinophil Latest Ref Range: 0.0-5.0 % 0.0  Basophil Latest Ref Range: 0.0-3.0 % 0.3  NEUT# Latest Ref Range: 1.4-7.7 K/uL 6.1  Lymphocyte # Latest Ref Range: 0.7-4.0 K/uL 0.6 (L)  Monocyte # Latest Ref Range: 0.1-1.0 K/uL 0.1  Eosinophils Absolute Latest Ref Range: 0.0-0.7 K/uL 0.0  Basophils Absolute Latest Ref Range: 0.0-0.1 K/uL 0.0    01/11/16: BMET:  Results for TEEGHAN, HAMMER (MRN [id number]) as  of 01/11/2016 17:53  Ref. Range 01/11/2016 12:48  Sodium Latest Ref Range: 135-145 mEq/L 131 (L)  Potassium Latest Ref Range: 3.5-5.1 mEq/L 4.2  Chloride Latest Ref Range: 96-112 mEq/L 96  CO2 Latest Ref Range: 19-32 mEq/L 28  BUN Latest Ref Range: 6-23 mg/dL 21  Creatinine Latest Ref Range: 0.40-1.20 mg/dL 2.72 (H)  Calcium Latest Ref Range: 8.4-10.5 mg/dL 9.2  Glucose Latest Ref Range: 70-99 mg/dL 536 (H)  GFR Latest Ref Range: >60.00 mL/min 42.31 (L)    01/11/16: Acute Office Visit: Pt. Presents with her daughter for acute office visit with c/o PND, runny nose with clear drainage, non-productive cough.She is also very sleepy, which her daughter attributes to the trazodone she is taking for sleep. Family do not want her treated with prednisone taper. Recently started on Methimazole for low TSH ( Differential diagnosis is between mild Graves' disease (Graves antibodies are not elevated) and toxic adenoma.  Continues to have insomnia, but discussed seeing if the thyroid medication helps with this before adding a different agent. No  Fever,chest pain , orthopnea , dyspnea, hemoptysis,syncope or palpitations.    Current outpatient prescriptions:  .  acetaminophen (TYLENOL) 325 MG tablet, Take 325 mg by mouth every 6 (six) hours as needed for moderate pain., Disp: , Rfl:  .  ALPRAZolam (XANAX) 0.25 MG tablet, Take 1 tablet by mouth twice daily, Disp: 60 tablet, Rfl: 0 .  aspirin EC 81 MG tablet, Take 81 mg by mouth daily., Disp: , Rfl:  .  Cholecalciferol (VITAMIN D3) 2000 UNITS capsule, Take 1 capsule (2,000 Units total) by mouth daily., Disp:  30 capsule, Rfl: 6 .  Fluticasone-Salmeterol (ADVAIR DISKUS) 100-50 MCG/DOSE AEPB, Inhale 1 puff into the lungs 2 (two) times daily., Disp: 60 each, Rfl: 6 .  guaifenesin (ROBITUSSIN) 100 MG/5ML syrup, Take 200 mg by mouth 3 (three) times daily as needed for cough., Disp: , Rfl:  .  methimazole (TAPAZOLE) 5 MG tablet, Take 1 tablet (5 mg total) by mouth  daily., Disp: 60 tablet, Rfl: 2 .  Multiple Vitamin (MULTIVITAMIN WITH MINERALS) TABS tablet, Take 1 tablet by mouth daily. Centrum Silver, Disp: , Rfl:  .  NORVASC 5 MG tablet, TAKE 1 TABLET ONCE DAILY., Disp: 30 tablet, Rfl: 6 .  polyethylene glycol (MIRALAX / GLYCOLAX) packet, Take 17 g by mouth daily as needed for mild constipation. , Disp: , Rfl:  .  potassium chloride (K-DUR,KLOR-CON) 10 MEQ tablet, Take 2 tablets (20 mEq total) by mouth daily., Disp: 30 tablet, Rfl: 1 .  predniSONE (DELTASONE) 10 MG tablet, take  4 each am x 2 days,  2 each am x 2 days,  1 each am x 2 days and stop, Disp: 14 tablet, Rfl: 0 .  PROAIR HFA 108 (90 BASE) MCG/ACT inhaler, INHALE 2 PUFFS EVERY 4 HOURS AS NEEDED, Disp: 8.5 g, Rfl: 0 .  ranitidine (ZANTAC) 150 MG tablet, Take 150 mg by mouth daily., Disp: , Rfl:  .  sertraline (ZOLOFT) 50 MG tablet, TAKE (1) TABLET BY MOUTH AT BEDTIME., Disp: 30 tablet, Rfl: 5 .  Spacer/Aero-Holding Chambers (AEROCHAMBER PLUS WITH MASK) inhaler, Use as instructed, Disp: 1 each, Rfl: 2 .  torsemide (DEMADEX) 20 MG tablet, Take 1 tablet (20 mg total) by mouth daily. (Patient taking differently: Take 20 mg by mouth daily. 1/2 tablet daily), Disp: 30 tablet, Rfl: 11 .  traZODone (DESYREL) 50 MG tablet, Take 1 tablet (50 mg total) by mouth at bedtime as needed for sleep., Disp: 30 tablet, Rfl: 1   Past Medical History  Diagnosis Date  . Shortness of breath   . Unspecified essential hypertension   . Right bundle branch block   . Other and unspecified hyperlipidemia   . Other abnormal glucose   . Diverticulosis of colon (without mention of hemorrhage)   . Pyelonephritis, unspecified   . Solitary cyst of breast   . Osteoarthrosis, unspecified whether generalized or localized, unspecified site   . Lumbago   . Spinal stenosis, unspecified region other than cervical   . Anemia, unspecified   . GERD (gastroesophageal reflux disease)   . Pyelonephritis   . DJD (degenerative joint  disease)   . UTI (lower urinary tract infection)     Allergies  Allergen Reactions  . Azithromycin Shortness Of Breath  . Ciprofloxacin Other (See Comments)     hallucinations  . Levofloxacin Other (See Comments)    Insomnia, indigestion, tingling sensation in legs  . Furosemide Rash    Bullous pemphigoid  . Latex Rash  . Other Rash    EKG leads caused a rash that required steroids to clear     Review of Systems Constitutional:   No  weight loss, night sweats,  Fevers, chills, +fatigue, or  lassitude.  HEENT:   No headaches,  Difficulty swallowing,  Tooth/dental problems, or  Sore throat,                + sneezing, itching, ear ache,+ nasal congestion, +post nasal drip,   CV:  No chest pain,  Orthopnea, PND, swelling in lower extremities, anasarca, dizziness, palpitations, syncope.   GI  No heartburn, indigestion, abdominal pain, nausea, vomiting, diarrhea, change in bowel habits, loss of appetite, bloody stools.   Resp: No shortness of breath with exertion or at rest.  No excess mucus, no productive cough,  No non-productive cough,  No coughing up of blood.  No change in color of mucus.  No wheezing.  No chest wall deformity  Skin: no rash or lesions.  GU: no dysuria, change in color of urine, no urgency or frequency.  No flank pain, no hematuria   MS:  No joint pain or swelling.  No decreased range of motion.  No back pain.  Psych:  + change in mood or affect( sleepy). No depression or anxiety.  No memory loss.        Objective:   Physical Exam  BP 126/78 mmHg  Pulse 92  Ht 5' 7.5" (1.715 m)  Wt 181 lb 9.6 oz (82.373 kg)  BMI 28.01 kg/m2  SpO2 95%  Physical Exam:  General- No distress,  A&Ox3, but sleepy, uses a walker ENT: no sinus tenderness, TM clear, edematous pale nasal mucosa, no oral exudate,+ post nasal drip, no LAN Cardiac: S1, S2, regular rate and rhythm, no murmur Chest: No wheeze/ rales/ dullness; no accessory muscle use, no nasal flaring, no  sternal retractions Abd.: Soft Non-tender Ext: No clubbing cyanosis, edema Neuro:  normal strength Skin: No rashes, warm and dry Psych: normal mood and behavior with exception of sleepiness.     Assessment & Plan:

## 2016-01-11 NOTE — Assessment & Plan Note (Signed)
Significant Sleepiness during the day with continued insomnia at night. Plan: We will discontinue the trazadone today, as it is contributing to daytime sleepiness and is not effective in treating insomnia Give the thyriod medication adjustments  time to see if they are effective in treating insomnia. Consider adding melatonin if the thyroid medication changes are not effective in treating insomnia.

## 2016-01-11 NOTE — Assessment & Plan Note (Signed)
Runny nose with clear drainage, non-productive cough  Plan: CXR today CBC with Diff today( WBC 6.8) BMET today We will try taking Delsym for cough  10 mls every 12 hours as needed for cough not to exceed 20 mls per 24 hours. Add Zyrtec 10 mg daily Add flonase nasal spray 2 sprays each nostril once daily. We will call with your lab results We will phone in a prescription if white count is elevated. Follow up in 2 weeks with Kriste Basque Please contact office for sooner follow up if symptoms do not improve or worsen or seek emergency care

## 2016-01-11 NOTE — Patient Instructions (Addendum)
It is nice to meet you today. I am sorry you are not feeling well. We will discontinue the trazadone today. We will try taking Delsym for cough  10 mls every 12 hours as needed for cough not to exceed 20 mls per 24 hours. Add Zyrtec 10 mg daily Add flonase nasal spray 2 sprays each nostril once daily. We will call with your lab results We will phone in a prescription if white count is elevated. Follow up in 2 weeks with Kriste Basque Please contact office for sooner follow up if symptoms do not improve or worsen or seek emergency care

## 2016-01-13 ENCOUNTER — Telehealth: Payer: Self-pay | Admitting: Acute Care

## 2016-01-13 NOTE — Progress Notes (Addendum)
Subjective:    Patient ID: [name], female    DOB: [birth year], 82 y.o.   MRN: [id number]  HPI 82 y/o WF here for a follow up visit... she has multiple medical problems as noted below...  Followed for general medical purposes w/ hx chr obstructive asthma, severe episodic dyspnea from anxiety, HBP, RBBB, Hypercholesterolemia, borderline DM, DJD, LBP w/ sp stenosis, etc... ~  SEE PREV EPIC NOTES FOR THE OLDER DATA >>    CTAngio Chest 05/2009 showed no evid of PE, biapical pleuroparenchymal scarring otherw clear lungs, no adenopathy/ effusions/ etc...  CXR 3/15 showed norm heart size, clear lungs, elev of right hemidiaph, NAD...  LABS 3/15:  Chems- wnl;  CBC- wnl;  BNP=26...  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, etc;  She has mult somatic complaints- good days & bad, hard to sort out the salient features, I have prev rec a low dose Klonopin Bid to help w/ this & her dyspnea but daugh states she has never taken it, we discussed this again;  CC is leg pain- difficulty walking, uses cane, known spinal stenosis, she's had PT, she's had shots and encouraged to f/u w/ drRamos about this; not on pain meds "I use natural products";  Also c/o ?dyspnea as before, phlegm in throat, "attacks" of swallowing trouble which we have investigated w/ Ba swallow/ UGI, speech path eval, etc & she is rec to take Nexium 40Bid; daughter states she occas has to drive her around in her car w/ window 1/2 open to get relief (I again rec use of the Klonopin); offered GI eval w/ DrPerry or DrJEdwards & they will decide; daugh notes that they have  tried everything for the phlegm in throat including Mucinex, Rock&Rye, Rawleigh's salve, Vicks vaporub, Slippery [place]m lozenges, rolaids, warm honey & lemon, MMW; they wonder about food allergy & they are encouraged to f/u w/ GI;  Finally they mention her rash- sm patch on her back over sacrum & she was tried on several creams per DrJones- intol to some of them- still on Prednisone 29md & she needs to wean this off, they have f/u appt in several days...     There have been mult phone calls in the interim> feeling weak, SOB, edema; daugh states she not taking her Advair, Klonopin, on HCT25-1/2 daily, they didn't want to incr meds but requested Cards eval for "CHF"-ok...    She saw DrCherly Hensen2/30/15 but wouldn't allow EKG due to "allergy to the electrodes"; his note is reviewed- he agreed w/ ARB/diuretic but noted pts refusal & she wanted to be back on low dose Amlod; no signs of CHF- they did 2DEcho showing norm LV size & function w/ EF=65-70%, AoV leaflets mildly thickened w/o AS, MV leaflets mod thickened w/ trivMR, mild RA dil, PAsys=3772m; no change in meds...     She went to the ED 11/22/14 w/ a choking episode, SOB, altered mental status> note reviewed- all symptoms resolved spont; they did CXR- NAD; and Blood work- wnl...    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  We reviewed prob list, meds, xrays and labs> see below for updates >>   CXR 1/16 showed norm heart size, clear lungs, mild right diaph eventration- no change, Tspine DJD. DISH/ osteopenia; NAD...   2DEcho 1/16 showed norm LV size & function w/ EF=65-70%, AoV leaflets mildly thickened w/o AS, MV leaflets mod thickened w/ trivMR, mild RA dil, PAsys=330mg...  LABS 1/16:  Chems- wnl w/ Cr=0.95;  BNP=66;  CBC- wnl w/ Hg=14.4...  ~  January 26, 2015:  6wk ROV & MaAngeloeturns w/ her daughter [name] she appears stable and has no new complaints but retains mult minor somatic complaints as usual; the biggest issue is disposition & personal family matters; [name] Sjogrenontinues to live in her house alone, daughter does a great job of caring for her but it is wearing her out & pt is not always cooperative... They have looked into AL vs skilled care at several facities but they have not been to her satisfaction;  We will try for a [facility] home assessment & i told them i would be happy to assist in any way possible...     She has gained 9# & has 1-2+ pitting edema in LEs; daughter states she will only eat [employer] food "I like it" & we reviewed low sodium options; we decided to chenage her HCT25-1/2 tab to LATuckerer day...    She needs to increase her exercise program    We reviewed prob list, meds, xrays and labs> see below for updates >>  PLAN>> they will continue to look into AL situations; we will request [facility] home health assessment; change HCT to LASIX207mam, no salt, elev legs, try support hose...  ~  Mar 22, 2015:  93mo30mo & Shardai went to the ER 03/15/15> Notes reviewed- presented w/ SOB, noted incr swelling, not resting well; Exam showed Afeb, VSS, O2sat=94%, Lungs clear no wheezing, Cardiac exam neg, +edema in feet; CXR showed no edema, NAD; Labs- ok x K=3.0 (she is supposed to be on K20/d);  They gave her Lasix40 in the ER and they wondered about anxiety; Daughter indicates that she had been anxious after she pulled the cord off a ceiling fan & could not fix it; she also states that mother is not regular w/ her meds "she takes what she wants to take" but pt says "I take it when I think I need it"; they cannot agree on placement, they declined home health services; she has Klonopin 0.5mg 67mtake but pt won't take it> daugh says she stays outside on back porch til ~9pm daily ("I breath better outside") then goes in to get ready for bed...     EXAM shows Afeb, VSS, O2sat=93% on RA, wt is down 3#;  Chest-  clear w/o w/r/r;  Heart- RR w/o m/r/g;  Ext- neg x trace edema...   CXR 4/16 in ER> norm heart size, atherosclerosis of Ao, no edema or consolidation in lungs, eventration of right hemidiaph- no change, DJD spine.  EKG 4/16 in ER> NSR, rate72, PACs, LAD, RBBB  LABS 4/16 in ER> Chems- wnl x K=3.0;  CBC- wnl;  Troponin=neg;  BNP=69 PLAN>>  I have instructed pt & daugh to increase her KCl to 20mEq18m & take it regularly; she is encouraged to try the Klonopin 1/2 tab bid regularly for he anxiety...   ~  June 09, 2015:  58moROV & Sylvi indicates that she is doing satis, feels well, no new complaints or concerns, but there is stillalot of tension betw her & daughter shirley who does a fabulous job looking after & caring for her mother... They now have a lady who comes in 4PM-9PM to help MZaryabut she is not happy- "we just sit and eat ice cream"; [name] (tearful) notes no further anxiety/ panic attacks in the eve hours, mother doesn't want to accept help;  Counseling is recommended but they decline...     Chr Obstructive Asthma> her dyspnea is from anxiety/panic not asthma; on Advair100Bid (but only using it prn) & Proventil rescue prn; notes breathing back to baseline & prn Klonopin helps dyspnea but she won't use it...    HBP> on ASA81, Norvasc5, Lasix20, K10-2/d; BP=126/80, tol meds well; denies CP, palpit, ch in SOB, tr edema...    CHOL> on diet alone, refuses meds, FLP 3/14 shows TChol 177, TG 50, HDL 60, LDL 107    DM> on diet alone, wt stable at 172#, BS=100-120, last A1c (2/13) was 6.3 & she knows to restrict carbs etc...    GI- Reflux, Divertics, constip> prev on Protonix40, Miralax, Senakot-S; continue same meds..[name]   DJD/ LBP> on Pred10, OTC analgesics prn & osteobiflex prn; had right THR 2011; known sp stenosis w/ prev ESI... We reviewed prob list, meds, xrays and labs>  IMP/PLAN>>  Mother & [name] really need counseling but it's not going to happen; they are both wonderful people- Mother  961y/oand has mild senile dementia, daughter very loving & cares for mother deeply, worries, etc; mother needs med supervision and monitoring but refuses AL etc; and so it goes...   ~  July 21, 2015:  6wk ROV & add-on appt after 2 ER visits for dyspnea precipitated by anxiety> as prev noted- very difficult situation w/ 1113y/o mother who insists on living alone, doing her own meds, etc but incapable of doing all that needs to be done & remembering her meds etc;  Daughter has done all she can do as there is alot of history in their relationship- every visit the daughter is the one who cries and mother is just angry;  They have a woman who stays with the pt at night but she won't allow help during the day;  Episodes of dyspnea are ppt by panic attacks yet pt won't take benzos regularly & forgets to take them w/ these attacks- just calls the daugh & they freq wind up in the ER... Pt's son thinks her use of the rescue inhaler is an "upper" & causes problems, he wants her on oxygen instead & I reviewed Medicare guidelines- we will check ambulatory oxygen sat test today & sched ONO, otherw I offered to order Oxygen for them to self-pay but they decline... Pt flat out refused my rec for Klonopin Bid, she has Xanax 0.569mtabs which she uses sparingly, and given Ativan1m55mrom ER but says she doesn't have this med...      ER visit 07/15/15> c/o episode of SOB lasting 57m40mppt by sitting at home w/o AC &[facility], Exam was neg w/ norm VS & O2sat=98% on RA, she had sl tender right chest wall, CXR showed norm heart size/ clear lungs/ NAD, EKG showed NSR/ rate88/ RBBB/ LAD, old infer scar/ no acute changes, LABS wnl & enz were neg... Symptoms resolved spont & she was disch home...Marland KitchenMarland Kitchen  ER visit 06/12/15> another episode of SOB & no insight into cause but daugh indicates it was from anxiety/panic when she was home alone; Exam clear, VSS;  CXR was again wnl & Labs wnl as well; they gave her Ativan to try but she  hasn't used it; she was even given an aerochamber to use w/ her inhaler but she won't use that... EXAM reveals Afeb, VSS, O2sat=95% on RA;  HEENT- neg;  Chest- clear w/o w/r/r, sl tender on palp;  Heart- RR gr1/6 SEM no r/g;  Abd- soft, non-tender, neg;  Ext- VI, tr edema, no c/c;  Neuro- intact, walks w/ cane, anxious... We reviewed prob list, meds, xrays and labs> SHE DID NOT BRING MED BOTTLES OR LIST TO THE OV TODAY- reminded to do so for every visit!  CXR 7/24 & 07/15/15 showed norm heart size, tortuous Ao, clear lungs w/ mild elev of right hemidiaph, NAD.Marland KitchenMarland Kitchen  EKG 07/15/15 showed chronic changes- NSR/ rate88/ RBBB/ LAD/ old infer scar/ no acute abnormalities...  LABS 7-06/2015> Chems- wnl x BS=127-147;  BNP=55;  Troponins=neg;  CBC- wnl IMP/PLAN>>  I had another long talk w/ Joycelyn Schmid & her daughter [name]; I have again rec that she use a low dose of the ALPRAZOLAM 0.26m tabs- 1/2 tab Tid regularly at breakfast, lunch, & dinner; she may also take an extra 1/2 tab prn anytime she feels anxious or panic setting in;  We have suggested similar plan many times in the past & for whatever reason she will not do it!  I have offered Psyche referral or to set her up w/ a counselor but this too is declined;  SEnid Derryis having a very hard time w/ her mother's attitude, lack of cooperation, mild dementia at age 82 etc...   ~  October 24, 2015:  31moOV & [name] appears to be stable- mult chr complaints but nothing new & doing reasonably well;  She has moved to [facility]/AL & has a siActuaryired by the family (RGatewoodand this arrangement seems to be helping (pt likes [place] they get along but pt never misses an opportunity to "stick-it" to her daugh or demean her in some way)... We reviewed the following medical problems during today's office visit >>     Chr Obstructive Asthma> her dyspnea is from anxiety/panic not asthma; on Advair100Bid ([facility] requires regular Rx directions) & Proventil rescue  prn; notes breathing back to baseline & prn Klonopin helps dyspnea...    HBP> on ASA81, Norvasc5, off Lasix20, K10-2/d; BP=140/76, tol meds well; denies CP, palpit, ch in SOB, tr edema...    CHOL> on diet alone, refuses meds, FLP 3/14 shows TChol 177, TG 50, HDL 60, LDL 107    DM> on diet alone, wt up sl at 179#, BS in epic=94-127, last A1c (2/13) was 6.3 & she knows to restrict carbs etc...    GI- Reflux, Divertics, constip> prev on Protonix40, now on Zantac150, Miralax; continue same meds.. [name]  DJD/ LBP> prev on Pred10, OTC analgesics prn & osteobiflex prn; had right THR 2011; known sp stenosis w/ prev ESI... We reviewed prob list, meds, xrays and labs> she had the 2016 Flu vaccine... IMP/PLAN>>  Stable overall, continue current meds, rec to increase exercise & decr intake (work on wt reduction);  We plan ROV recheck w/ blood work in 3 mo...  ~  December 29, 2015:  54m44moV & [name] and her daugh continue to be locked in battle regarding her care (resides  at [facility] & has daytime sitter [place] whom she loves); she persists w/ mult somatic complaints- SOB irritable, throbbing in her legs, etc;  She saw TP 11/22/15 w/ CC insomnia & fatigue, and her Thyroid function had not been checked for awhile=> labs showed TSH=0.05 and FreeT4=1.70 (prev TSH was 1.93 in 2014); she was referred to DrGherghe & seen 12/01/15> exam of neck was neg, TFT labs proved thyrotoxicosis, TSI was neg as was a Sed rate (14); they chose to try low dose Methimazole rx (28m/d) as opposed to scan/further testing; she has f/u appt w/ DrGherghe pending...     Breathing is stable on Advair100Bid, plus Flonase,Zyrtek, AlbutHFA prn; she continues to c/o SOB/ DOE...    BP is controlled on Amlod5, Demadex20, K10Bid; BP= 116/58 & she denies CP, palpit, ch in edema, etc...    GI- controlled on Zantac150, Miralax...    She remains on Xanax0.25Bid & Desyrel50Qhs prn sleep (she is out of Zoloft)... EXAM reveals Afeb, VSS, O2sat=95% on RA;  HEENT-  neg;  Chest- clear w/o w/r/r;  Heart- RR gr1/6 SEM no r/g;  Abd- soft, non-tender, neg;  Ext- VI, tr edema, no c/c;  Neuro- intact, walks w/ cane, anxious...  LABS 11/2015 in Epic>  TSH=0.05 (confirmed), FreeT4=1.70, FreeT3=4.0, Thy Stim Ig was wnl;  Sed=14...  LABS 12/29/15> [name] insisted on recheck CBC & Iron level>  CBC- wnl w/ Hg=14.6, Fe=99 (31%sat);  Chems- abn w/ Na=130, TCO=39, Cr=1.2 IMP/PLAN>>  We checked the above labs, reminded to use Tylenol prn, no salt, elevate, etc; we decided to decr the Demadex to 1/2 tab Qam & ADD [id number] tab in the afternoon; she is reassured about her health & asked to f/u w/ DrGherghe for Endocrine...          Problem List:       DYSPNEA (ICD-786.05) - long hx of chronic obstructive asthma treated w/ ADVAIR100Bid & PROAIR (she uses them Prn now)... she is a non-smoker w/ some reactive airways disease in the past & retired from [employer] years in 1991... she denies cough, sputum, hemoptysis, worsening dyspnea, wheezing, chest pains, snoring, daytime hypersomnolence, etc...  ~  baseline CXR w/o acute changes...  ~  PFT's 5/02 w/ FVC 2.07 (68%), FEV1=1.36 (57%), and FEV1/FVC ratio=66%, mid-flows 42%... ~  CT Angio 7/10 was neg- x biapical pleuroparenchymal scarring... ~  CXR 10/11 showed sl elev right hemidaiph, mild DJD sp, osteopenia, NAD..[name] ~  CXR 6/12 showed mild apical scarring, clear & NAD, DJD sp w/ osteophytes... ~  Intermittent dyspnea more related to anxiety & treated w/ KLONOPIN 0.534m1/2 to 1 tab Bid==> improved. ~  CXR 4/13 showed normal heart size, clear lungs, DJD in TSpine... ~  CXR 2/14 showed normal heart size, clear lungs w/ sl peribronch thickening, DJD in spine, NAD...[name]~  CXR 3/15 showed norm heart size, clear lungs, elev of right hemidiaph, NAD... ~  She is encouraged to take the Klonopin 0.74m58mid regularly- consider taking 1/2 in AM, 1/2 in afternoon, one at bedtime... ~  1/16: she continues w/ mult somatic complaints and  intermittent choking episodes etc; she refuses to take the Advair or Klonopin... ~  CXR 1/16 showed norm heart size, clear lungs, mild right diaph eventration- no change, Tspine DJD. DISH/ osteopenia; NAD... ~  3/16: she is stable on current meds, asked to incr her non-existent exercise program... ~  4/16: went to ER w/ SOB- nothing found x mild edema; given Lasix40, potassium was low, thought  to be anxious; in office f/u her daugh confirms anxiety & pt not taking her meds regularly; asked to take meds every day & try the Klonopin... ~  7/16: same thing- went to ER w/ dyspnea, nothing found & given Ativan but she wouldn't take it... ~  8/16: another episode w/ ER eval and symptoms resolved spontaneously on there own... ~  9/16: pt is advised (again) to start regular dosing of Alpraz 0.82m - 1/2 tab Tid w/ extra 1/2 tab as needed for nerves... ~  12/16: she remains on Advair100-2spBid & Alpraz0.264mid=> improved...  HYPERTENSION (ICD-401.9) - controlled on NORVASC 1m90maily & HCTZ 21m66m daily...  ~  2/13:  BP 144/70 today> tol rx well & denies HA, visual changes, CP, palipit, dizziness, syncope, edema, etc... ~  4/13:  BP= 148/80 & she denies CP, palpit, edema; dyspnea improved w/ Klonopin. ~  6/13:  BP= 132/68 & as noted she has mult somatic complaints... ~  3/14:  on Norvasc10, HCTZ25; BP=136/60, tol meds well; denies CP, palpit, ch in SOB, edema, etc  ~  9/14:  BP controlled on Amlod5 & Hct25-1/2 daily; BP= 130/70 & she denies CP, palpit, dizzy, SOB, edema, etc. ~  3/15: on ASA81, Norvasc5, Lasix20- 1-2/d; BP=136/70, tol meds well; denies CP, palpit, ch in SOB, but notes persist edema in legs; Rec to ch Amlod5 to Losar50, low sodium, elev legs, Lasix40. ~  5/15: on Losar50, Lasix40;  BP= 138/60 & she denies angina pain, palpit, ch in SOB, etc... ~  7/15: on Losar50, she stopped Lasix; BP= 138/78 and they want to go back on Amlod5 + HCT12.5 daily... ~  9/15: on Amlod5, Hct12.5; BP= 128/80 & she  likes this combo better- denies CP, palpit, etc... ~  1/16: on Amlod5, Hct12.5 but ?what she is taking; BP= 130/80 & she has refused the ARBs... ~  3/16: on Amlod5, Hct12.5; BP= 142/80 & she has gained 9# w/ 1-2+ edema; discussed low sodium & change Hct to LASIX20 Qam... ~  5/16: on Amlod5, Lasix20, K20; BP=148/70, recent K=3.0 & daugh notes she's not taking meds regularly; asked to take meds everyday & incr K20Bid...  RIGHT BUNDLE BRANCH BLOCK (ICD-426.4) - on ASA 81mg61m. baseline EKG w/ RBBB and 2DEcho 5/02 showed mild asymmetric LVH w/ incr EF... ~  12/15: she had Cards eval by DrNishan> he rec ARB/diuretic but she refused to switch from her Amlod5/ Hct12.5 regimen; she refused EKG due to "allergy to the electrodes"... ~  2DEcho 1/16 showed norm LV size & function w/ EF=65-70%, AoV leaflets mildly thickened w/o AS, MV leaflets mod thickened w/ trivMR, mild RA dil, PAsys=37mmH64m ~  EKG 4/16 showed NSR, rate72, PACs, LAD, RBBB  VENOUS INSUFFIC & EDEMA >>  ~  3/15: she was switched off Amlod & onto Losar50, Lasix20=>40, plus no salt, elevation, support hose, etc...  ~  9/15: they preferred the Amlod5[facility]12.5 regimen; she has VI & 1+edema but stable, no acute changes... 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 ~  3/16: she remains on the Pred from Derm, now w/ incr edema & 9# wt gain, rec no salt & change Hct to LASIX20/d... ~  5/16: improved on Lasix20 w/ edema decr 7 wt down several lbs... ~  12/16: on ASA81, Norvasc5, off Lasix20, K10-2/d; BP=140/76, tol meds well; denies CP, palpit, ch in SOB, tr edema.  HYPERLIPIDEMIA (ICD-272.4) - on diet alone... she forgets to come to  visits FASTING for this blood work ~  [place] 8/07 showed TChol 205, TG 71, HDL 49, LDL 130... ~  FLP 6/12 on diet alone showed TChol 218, TG 50, HDL 66, LDL 129 ~  FLP 2/13 on diet alone showed TChol 171, TG 43, HDL 66, LDL 97 ~  FLP 3/14 on diet alone showed TChol 177,  TG 50, HDL 60, LDL 107 ~  She needs to ret FASTING for f/u FLP...  DIABETES MELLITUS, BORDERLINE (ICD-790.29) - on diet alone w/ prev BS's in the 100-160 range... ~  labs in 2008-9 showed BS= 101 to 108 ~  labs 1/10 showed BS= 106, A1c= 5.9 ~  Labs 6/12 showed BS= 93, A1c= 6.6.Marland KitchenMarland Kitchen rec diet, exercise... ~  Labs 2/13 showed BS= 92, A1c= 6.3 ~  3/14: on diet alone, wt stable ~174#, BS=97, last A1c (2/13) was 6.3 & she knows to restrict carbs etc. ~  Labs 3/15 showed BS= 95; and BS= 120 in [id number]...  ~  Labs 2016 showed BS= 101-135 on diet alone...  INDIGESTION/ REFLUX SYMPTOMS >> see 10/12 note & PROTONIX 57m/d started, further eval if symptoms persist... ESOPHAGEAL DYSMOTILITY/ PRESBYESOPHAGUS >>  ~  4/13:  She notes some reflux symptoms and excess gas w/ belching; rec to take the Protonix daily & Simethacone vs Tums which she says helps her gas. ~  5/13:  She saw GI DrPerry w/ rec to take Prilosec for her indigestion... ~  5/15:  She presented w/ worsening indigestion, dysphagia, reflux & Prev30 was incr to Bid w/ GI f/u suggested for EGD... 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 ~  9/15:  Symptoms persist but she is not regurg or vomiting, and weight stable; they want to change to Nexium40Bid-OK, and rec proceed w/ MBS by speech path... ~  She continues to have intermit choking episodes and c/o phlegm in her throat; she has tried "everything" & encouraged to f/u w/ GI- DrPerry/ DrJEdwards...  DIVERTICULOSIS OF COLON (ICD-562.10) - she takes SENAKOT-S, MIRALAX, Peppermint Tea, & sauerkraut Prn...last colonoscopy 9/02 by DrPerry was WNL...  PYELONEPHRITIS (ICD-590.80) - SEE 1/09  Hospitalization (reviewed)... ~  She saw DrMacDiarmid for her recurrent UTIs, chronic cystitis, urge & stress incont, nocturia; [place]..  Hx of BREAST CYST (ICD-610.0)  DEGENERATIVE JOINT DISEASE (ICD-715.90) - s/p right hip hemiarthroplasty 11/09 by DrAplington w/ wound complic... then dx w/ loosening of the femoral shaft & had conversion to right THR by DrAlusio 10/11 & much improved... she uses CELEBREX 2083mPrn (seldom takes this).  LOW BACK PAIN SYNDROME (ICD-724.2) & SPINAL STENOSIS (ICD-724.00) - severe LBP & spinal stenosis w/ evals by DrRamos & DrNudelman... s/p shots, considering poss surgery vs alternative therapies... she takes Celebrex, Osteobiflex, MVI, Vit D... ~  8/10: eval by DrAplington- diff leg lengths, lift placed in right shoe, then trial Lyrica5064m. ~  12/11:  improved after hip revision surg (to THR) 10/11 w/ better ambulaton... ~  5/13:  DrRamos gave her another ESI for her leg pain related to sp stenosis... ~  3/14:  C/o neuropathic discomfort in legs- eval by Ramos & offered shots in her back; try Lyrica50  in the interim... ~  Persistent leg pain, on OTC "natural" meds she says; encouraged to f/u w/ DrRamos et al...  Hx of ANEMIA (ICD-285.9) - eval by GI in 2002 showed normal EGD and Colon... prob iron malabsorption problem Rx'd w/ Fe infusion... ~  labs 1/10 showed Hg= 14.6, MCV= 89, Fe= 94 ~  labs 12/11 showed Hg= 12.8, MCV= 90, Fe= 33... try Fe supplement + VitC... ~  Labs 6/12 showed Hg= 15.0 ~  Labs 2/13 showed Hg= 14.6 ~  Labs 2/14 showed Hg= 14.8 ~  Labs 3/15 showed Hg= 14.9 ~  Labs 1/16 showed Hg= 14.4 ~  Labs 4/16 showed Hg= 13.7  DERM:  rash Rx'd by dermatology- OLUX-E foam= clobetasol Foam 0.05%... 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 ~  5/16: she is still on Pred per Derm- currently 13m/d...    Past Surgical History  Procedure Laterality Date  .  Cataract extraction    . Right hip hemiarthroplasty      total  . Conversion to right thr      Outpatient Encounter Prescriptions as of 10/24/2015  Medication Sig  . ALPRAZolam (XANAX) 0.25 MG tablet Take 1 tablet by mouth twice daily  . aspirin EC 81 MG tablet Take 81 mg by mouth daily.  . Cholecalciferol (VITAMIN D3) 2000 UNITS capsule Take 1 capsule (2,000 Units total) by mouth daily.  . diphenhydramine-acetaminophen (TYLENOL PM) 25-500 MG TABS tablet Take 1 tablet by mouth at bedtime as needed.  . Fluticasone-Salmeterol (ADVAIR DISKUS) 100-50 MCG/DOSE AEPB Inhale 1 puff into the lungs 2 (two) times daily.  . Multiple Vitamin (MULTIVITAMIN WITH MINERALS) TABS tablet Take 1 tablet by mouth daily. Centrum Silver  . NORVASC 5 MG tablet TAKE 1 TABLET ONCE DAILY.  .Marland Kitchenpolyethylene glycol (MIRALAX / GLYCOLAX) packet Take 17 g by mouth daily as needed for mild constipation.   . potassium chloride (K-DUR,KLOR-CON) 10 MEQ tablet Take 2 tablets (20 mEq total) by mouth daily.  .Marland KitchenPROAIR HFA 108 (90 BASE) MCG/ACT inhaler INHALE 2 PUFFS EVERY 4 HOURS AS NEEDED  . ranitidine (ZANTAC) 150 MG tablet Take 150 mg by mouth daily.  . sertraline (ZOLOFT) 50 MG tablet TAKE (1) TABLET BY MOUTH AT BEDTIME.  .Marland KitchenSpacer/Aero-Holding Chambers (AEROCHAMBER PLUS WITH MASK) inhaler Use as instructed  . Alum & Mag Hydroxide-Simeth (MAGIC MOUTHWASH W/LIDOCAINE) SOLN Take 5 mLs by mouth 4 (four) times daily as needed for mouth pain. (Patient not taking: Reported on 10/24/2015)  . BENZOCAINE, DENTAL, (ANBESOL MAXIMUM STRENGTH) 20 % LIQD Place 1 application onto teeth daily as needed (gum irritation).  . ferrous sulfate 325 (65 FE) MG tablet Take 325 mg by mouth daily with breakfast.(Pt not taking 10/24/15)   . furosemide (LASIX) 20 MG tablet Take 1 tablet (20 mg total) by mouth daily. (Patient not taking: Reported on 10/24/2015)  . magnesium hydroxide (MILK OF MAGNESIA) 800 MG/5ML suspension Take 30 mLs by mouth daily as needed  for constipation. (Pt not taking 10/24/15)  . OVER THE COUNTER MEDICATION Take 1 application by mouth daily as needed (sinus congestion). Rawleigh's Salve - apply to nostrils (Pt not taking 10/24/15)  . predniSONE (DELTASONE) 10 MG tablet Take 5 mg by mouth See admin instructions. Take 1/2 tablet (5 mg) daily for 3 days for itching; hold for 10 days, then restart if needed for itching rash on back. (Pt not taking 10/24/15)    Allergies  Allergen Reactions  .  Azithromycin Shortness Of Breath  . Ciprofloxacin Other (See Comments)     hallucinations  . Levofloxacin Other (See Comments)    Insomnia, indigestion, tingling sensation in legs  . Furosemide Rash    Bullous pemphigoid  . Latex Rash  . Other Rash    EKG leads caused a rash that required steroids to clear    Current Medications, Allergies, Past Medical History, Past Surgical History, Family History, and Social History were reviewed in [employer] record.    Review of Systems         See HPI - all other systems neg except as noted... The patient complains of decreased hearing, dyspnea on exertion, muscle weakness, and difficulty walking.  The patient denies anorexia, fever, weight loss, weight gain, vision loss, hoarseness, chest pain, syncope, peripheral edema, prolonged cough, headaches, hemoptysis, abdominal pain, melena, hematochezia, severe indigestion/heartburn, hematuria, incontinence, suspicious skin lesions, transient blindness, depression, unusual weight change, abnormal bleeding, enlarged lymph nodes, and angioedema.     Objective:   Physical Exam     WD, WN, Chr ill appearing 82 y/o WF in NAD... GENERAL:  Alert & oriented; pleasant & cooperative... HEENT:  [address]/AT, EOM-full, EACs-clear, TMs-wnl, NOSE-clear, THROAT-clear & wnl. NECK:  Supple w/ fairROM; no JVD; normal carotid impulses w/o bruits; no thyromegaly or nodules palpated; no lymphadenopathy. CHEST:  Clear to P & A; without wheezes/ rales/  or rhonchi heard... HEART:  Regular Rhythm; without murmurs/ rubs/ or gallops detected... ABDOMEN:  Soft & nontender; normal bowel sounds; no organomegaly or masses palpated... EXT:  mod arthritic changes, walks w/ cane, +venous insuffic & incr 1-2+ edema., scattered varicose veins... NEURO:  CN's intact; motor testing normal; no focal deficits... DERM:   mild intertrig rash under breast, & onychomycosis of toenails...  RADIOLOGY DATA:  Reviewed in the EPIC EMR & discussed w/ the patient...  LABORATORY DATA:  Reviewed in the EPIC EMR & discussed w/ the patient...   Assessment & Plan:    Hx episodes of SOB>> see above, she has been resistent to trying any of the meds & treatment suggestions that we have given to her (I know she would improve on Benzo rx)...  12/16> symptoms are better w/ Advair100-2spBid & Alprazolam 0.68mBid admin regularly at [facility]... 2/9> we checked the above labs, reminded to use Tylenol prn, no salt, elevate, etc; we decided to decr the Demadex to 1/2 tab Qam & ADD [id number] tab in the afternoon; she is reassured about her health & asked to f/u w/ DrGherghe for Endocrine.  DYSPNEA>  Hx asthma, stable off Advair, on Proair prn; hx anxiety component on Klonopin but she is not using!  HBP>  Controlled on Amlod5 + Lasix20, K20/d; but she is not taking meds regularly & labs 4/16 w/ K=3.0; therefore incr K20Bid...  RBBB>  Aware & denies CP, palpit, ch in DOE, etc... she thinks that she is allergic to EKG electrodes; EKG 4./16 in ER showed NSR, rate72, PACs, LAD, RBBB  CHOL>  On diet alone & FLP looks reasonable;  We reviewed low chol, low fat diet...  DM>  BS= 101-135 & last A1c is 6.3;  on diet alone & we reviewed low carb no sweets etc...  GI> Indigestion, Divertics> she notes most bowel symptoms resolved off spicey foods;  UGI symptoms w/ dysphagia, food sticking, belch/gas/ etc have not resolved on Protonix40Bid and after GI consult w/ DrPerry; we reviewed  her UGI series results and decided to proceed w/ MBS by Speech  Path; transiently improved then sought 2nd opinion from DrJEdwards=> on Nexium40Bid...  UTI>  Klebsiella UTI resolved after Septra Rx... She has been eval by DrMacDiarmid.  DJD, LBP, Spinal Stenosis>  Prev evals by Ortho, DrRamos, DrNudelman etc; improved after THR w/ better ambulation; c/o neuropathic discomfort in legs- she will f/u w/ Ramos for shots, using OTC "natural" meds...  Anxiety>  If she would take the Klonopin, I feel it would help...   Patient's Medications  New Prescriptions   PREDNISONE (DELTASONE) 10 MG TABLET    take  4 each am x 2 days,  2 each am x 2 days,  1 each am x 2 days and stop  Previous Medications   ACETAMINOPHEN (TYLENOL) 325 MG TABLET    Take 325 mg by mouth every 6 (six) hours as needed for moderate pain.   ALPRAZOLAM (XANAX) 0.25 MG TABLET    Take 1 tablet by mouth twice daily   ASPIRIN EC 81 MG TABLET    Take 81 mg by mouth daily.   CHOLECALCIFEROL (VITAMIN D3) 2000 UNITS CAPSULE    Take 1 capsule (2,000 Units total) by mouth daily.   FLUTICASONE-SALMETEROL (ADVAIR DISKUS) 100-50 MCG/DOSE AEPB    Inhale 1 puff into the lungs 2 (two) times daily.   GUAIFENESIN (ROBITUSSIN) 100 MG/5ML SYRUP    Take 200 mg by mouth 3 (three) times daily as needed for cough.   METHIMAZOLE (TAPAZOLE) 5 MG TABLET    Take 1 tablet (5 mg total) by mouth daily.   MULTIPLE VITAMIN (MULTIVITAMIN WITH MINERALS) TABS TABLET    Take 1 tablet by mouth daily. Centrum Silver   NORVASC 5 MG TABLET    TAKE 1 TABLET ONCE DAILY.   POLYETHYLENE GLYCOL (MIRALAX / GLYCOLAX) PACKET    Take 17 g by mouth daily as needed for mild constipation.    POTASSIUM CHLORIDE (K-DUR,KLOR-CON) 10 MEQ TABLET    Take 2 tablets (20 mEq total) by mouth daily.   PROAIR HFA 108 (90 BASE) MCG/ACT INHALER    INHALE 2 PUFFS EVERY 4 HOURS AS NEEDED   RANITIDINE (ZANTAC) 150 MG TABLET    Take 150 mg by mouth daily.   SERTRALINE (ZOLOFT) 50 MG TABLET    TAKE (1)  TABLET BY MOUTH AT BEDTIME.   SPACER/AERO-HOLDING CHAMBERS (AEROCHAMBER PLUS WITH MASK) INHALER    Use as instructed   TORSEMIDE (DEMADEX) 20 MG TABLET    Take 1 tablet (20 mg total) by mouth daily.   TRAZODONE (DESYREL) 50 MG TABLET    Take 1 tablet (50 mg total) by mouth at bedtime as needed for sleep.  Modified Medications   No medications on file  Discontinued Medications   ALUM & MAG HYDROXIDE-SIMETH (MAGIC MOUTHWASH W/LIDOCAINE) SOLN    Take 5 mLs by mouth 4 (four) times daily as needed for mouth pain.   CETIRIZINE (ZYRTEC ALLERGY) 10 MG TABLET    Take 1 tablet (10 mg total) by mouth daily.   FERROUS SULFATE 325 (65 FE) MG TABLET    Take 325 mg by mouth daily with breakfast. Reported on 11/22/2015   FLUTICASONE (FLONASE) 50 MCG/ACT NASAL SPRAY    Place 2 sprays into both nostrils daily.   HYDROCHLOROTHIAZIDE (MICROZIDE) 12.5 MG CAPSULE    Take 1 capsule (12.5 mg total) by mouth daily.   MAGNESIUM HYDROXIDE (MILK OF MAGNESIA) 800 MG/5ML SUSPENSION    Take 30 mLs by mouth daily as needed for constipation.   OVER THE COUNTER MEDICATION    Take  1 application by mouth daily as needed (sinus congestion). Reported on 11/22/2015   PREDNISONE (DELTASONE) 10 MG TABLET    Take 5 mg by mouth See admin instructions. Reported on 12/01/2015

## 2016-01-13 NOTE — Telephone Encounter (Signed)
I called Mrs. [name] daughter to give her the lab work results that were drawn yesterday 01/12/2016. I explained that her mother's white count was within normal limits of 6.8, and all other labs were essentially normal. She verbalized understanding of the above and had no further questions at the time the call was completed.

## 2016-01-16 ENCOUNTER — Other Ambulatory Visit: Payer: Self-pay | Admitting: Pulmonary Disease

## 2016-01-19 ENCOUNTER — Ambulatory Visit (INDEPENDENT_AMBULATORY_CARE_PROVIDER_SITE_OTHER): Payer: Medicare Other | Admitting: Podiatry

## 2016-01-19 ENCOUNTER — Encounter: Payer: Self-pay | Admitting: Podiatry

## 2016-01-19 DIAGNOSIS — M79674 Pain in right toe(s): Secondary | ICD-10-CM

## 2016-01-19 DIAGNOSIS — M79675 Pain in left toe(s): Secondary | ICD-10-CM

## 2016-01-19 DIAGNOSIS — M79676 Pain in unspecified toe(s): Secondary | ICD-10-CM

## 2016-01-19 DIAGNOSIS — B351 Tinea unguium: Secondary | ICD-10-CM | POA: Diagnosis not present

## 2016-01-19 NOTE — Addendum Note (Signed)
Addended by: Harlon Flor, Cassandra Harbold L on: 01/19/2016 10:56 AM   Modules accepted: Orders, Medications

## 2016-01-19 NOTE — Progress Notes (Signed)
Subjective:     Patient ID: [name], female   DOB: 08/10/1923, 82 y.o.   MRN: [id number]  HPI this patient is a resident of [facility]. She says that she was taking to a nail salon and she was referred to this office for alignment and treatment of her thick big toenails, both feet. She has thick disfigured discolored nails, which are painful walking and wearing her shoes. No evidence for any redness, swelling or infection. Patient presents for preventative routine foot care services   Review of Systems     Objective:   Physical Exam GENERAL APPEARANCE: Alert, conversant. Appropriately groomed. No acute distress.  VASCULAR: Pedal pulses palpable at  DP and PT bilateral.  Capillary refill time is immediate to all digits,  Normal temperature gradient.  Digital hair growth is present bilateral  NEUROLOGIC: sensation is normal to 5.07 monofilament at 5/5 sites bilateral.  Light touch is intact bilateral, Muscle strength normal.  MUSCULOSKELETAL: acceptable muscle strength, tone and stability bilateral.  Intrinsic muscluature intact bilateral.  Rectus appearance of foot and digits noted bilateral.   DERMATOLOGIC: skin color, texture, and turgor are within normal limits.  No preulcerative lesions or ulcers  are seen, no interdigital maceration noted.  No open lesions present.  . No drainage noted. NAILS  Thick disfigured discolored hallux nails both feet.  Her left hallux toenail appears to be due to trauma.  Her right hallux is thickened pincer toenail.     Assessment:     Onychomycosis  B/L    Plan:     IE  Debridement of hallux nails.  RTC 3 months.  Stony Stegmann DPM      

## 2016-01-20 ENCOUNTER — Other Ambulatory Visit: Payer: Self-pay | Admitting: Pulmonary Disease

## 2016-01-23 ENCOUNTER — Ambulatory Visit: Payer: Medicare Other | Admitting: Pulmonary Disease

## 2016-02-02 ENCOUNTER — Other Ambulatory Visit: Payer: Self-pay | Admitting: Acute Care

## 2016-02-09 ENCOUNTER — Encounter: Payer: Self-pay | Admitting: Pulmonary Disease

## 2016-02-09 ENCOUNTER — Ambulatory Visit (INDEPENDENT_AMBULATORY_CARE_PROVIDER_SITE_OTHER): Payer: Medicare Other | Admitting: Pulmonary Disease

## 2016-02-09 VITALS — BP 140/70 | HR 78 | Temp 96.8°F | Ht 67.5 in | Wt 166.0 lb

## 2016-02-09 DIAGNOSIS — I872 Venous insufficiency (chronic) (peripheral): Secondary | ICD-10-CM

## 2016-02-09 DIAGNOSIS — E0580 Other thyrotoxicosis without thyrotoxic crisis or storm: Secondary | ICD-10-CM

## 2016-02-09 DIAGNOSIS — I10 Essential (primary) hypertension: Secondary | ICD-10-CM

## 2016-02-09 DIAGNOSIS — J410 Simple chronic bronchitis: Secondary | ICD-10-CM | POA: Diagnosis not present

## 2016-02-09 DIAGNOSIS — F419 Anxiety disorder, unspecified: Secondary | ICD-10-CM

## 2016-02-09 DIAGNOSIS — M150 Primary generalized (osteo)arthritis: Secondary | ICD-10-CM

## 2016-02-09 DIAGNOSIS — I4510 Unspecified right bundle-branch block: Secondary | ICD-10-CM | POA: Diagnosis not present

## 2016-02-09 DIAGNOSIS — M159 Polyosteoarthritis, unspecified: Secondary | ICD-10-CM

## 2016-02-09 DIAGNOSIS — R609 Edema, unspecified: Secondary | ICD-10-CM

## 2016-02-09 NOTE — Patient Instructions (Signed)
HAPPY 89st BIRTH[birth year] TODAY!!!  Today we updated your med list in our EPIC system...    We decided to increase the DEMADEX 20mg  to one tab each AM...  Keep legs elevated as much as possible and do not add salt in your diet...  << IT IS OK TO KEEP THE PROAIR-HFA RESCUE INHALER WITH YOU IN YOUR ROOM  FOR AS NEEDED USE -- 1 to 2 inhalations every 6H as needed >>  Call for any questions...  Let's plan a follow up visit in 64mo, sooner if needed for problems.[name]..[name]

## 2016-02-09 NOTE — Progress Notes (Signed)
Subjective:    Patient ID: [name], female    DOB: 27-Mar-1923, 82 y.o.   MRN: [id number]  HPI 82 y/o WF here for a follow up visit... she has multiple medical problems as noted below...  Followed for general medical purposes w/ hx chr obstructive asthma, severe episodic dyspnea from anxiety, HBP, RBBB, Hypercholesterolemia, borderline DM, DJD, LBP w/ sp stenosis, etc... ~  SEE PREV EPIC NOTES FOR THE OLDER DATA >>    CTAngio Chest 05/2009 showed no evid of PE, biapical pleuroparenchymal scarring otherw clear lungs, no adenopathy/ effusions/ etc...  CXR 3/15 showed norm heart size, clear lungs, elev of right hemidiaph, NAD...  LABS 3/15:  Chems- wnl;  CBC- wnl;  BNP=26...  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, etc;  She has mult somatic complaints- good days & bad, hard to sort out the salient features, I have prev rec a low dose Klonopin Bid to help w/ this & her dyspnea but daugh states she has never taken it, we discussed this again;  CC is leg pain- difficulty walking, uses cane, known spinal stenosis, she's had PT, she's had shots and encouraged to f/u w/ drRamos about this; not on pain meds "I use natural products";  Also c/o ?dyspnea as before, phlegm in throat, "attacks" of swallowing trouble which we have investigated w/ Ba swallow/ UGI, speech path eval, etc & she is rec to take Nexium 40Bid; daughter states she occas has to drive her around in her car w/ window 1/2 open to get relief (I again rec use of the Klonopin); offered GI eval w/ DrPerry or DrJEdwards & they will decide; daugh notes that they have  tried everything for the phlegm in throat including Mucinex, Rock&Rye, Rawleigh's salve, Vicks vaporub, Slippery [place]m lozenges, rolaids, warm honey & lemon, MMW; they wonder about food allergy & they are encouraged to f/u w/ GI;  Finally they mention her rash- sm patch on her back over sacrum & she was tried on several creams per DrJones- intol to some of them- still on Prednisone 280md & she needs to wean this off, they have f/u appt in several days...     There have been mult phone calls in the interim> feeling weak, SOB, edema; daugh states she not taking her Advair, Klonopin, on HCT25-1/2 daily, they didn't want to incr meds but requested Cards eval for "CHF"-ok...    She saw DrCherly Hensen2/30/15 but wouldn't allow EKG due to "allergy to the electrodes"; his note is reviewed- he agreed w/ ARB/diuretic but noted pts refusal & she wanted to be back on low dose Amlod; no signs of CHF- they did 2DEcho showing norm LV size & function w/ EF=65-70%, AoV leaflets mildly thickened w/o AS, MV leaflets mod thickened w/ trivMR, mild RA dil, PAsys=3743m; no change in meds...     She went to the ED 11/22/14 w/ a choking episode, SOB, altered mental status> note reviewed- all symptoms resolved spont; they did CXR- NAD; and Blood work- wnl...    Derm eval DrDJones w/ Dx of  bullous pemphigoid- on Pred 5m/d & they understand that this can contrib to edema; advised 2gm Na diet etc; they do not want stronger diuretic...  We reviewed prob list, meds, xrays and labs> see below for updates >>   CXR 1/16 showed norm heart size, clear lungs, mild right diaph eventration- no change, Tspine DJD. DISH/ osteopenia; NAD...   2DEcho 1/16 showed norm LV size & function w/ EF=65-70%, AoV leaflets mildly thickened w/o AS, MV leaflets mod thickened w/ trivMR, mild RA dil, PAsys=334mg...  LABS 1/16:  Chems- wnl w/ Cr=0.95;  BNP=66;  CBC- wnl w/ Hg=14.4...  ~  January 26, 2015:  6wk ROV & MaKaitlandeturns w/ her daughter [name] she appears stable and has no new complaints but retains mult minor somatic complaints as usual; the biggest issue is disposition & personal family matters; [name] to live in her house alone, daughter does a great job of caring for her but it is wearing her out & pt is not always cooperative... They have looked into AL vs skilled care at several facities but they have not been to her satisfaction;  We will try for a [facility] home assessment & i told them i would be happy to assist in any way possible...     She has gained 9# & has 1-2+ pitting edema in LEs; daughter states she will only eat [employer] food "I like it" & we reviewed low sodium options; we decided to chenage her HCT25-1/2 tab to LABonnievilleer day...    She needs to increase her exercise program    We reviewed prob list, meds, xrays and labs> see below for updates >>  PLAN>> they will continue to look into AL situations; we will request [facility] home health assessment; change HCT to LASIX20109mam, no salt, elev legs, try support hose...  ~  Mar 22, 2015:  83mo583mo & Tisheena went to the ER 03/15/15> Notes reviewed- presented w/ SOB, noted incr swelling, not resting well; Exam showed Afeb, VSS, O2sat=94%, Lungs clear no wheezing, Cardiac exam neg, +edema in feet; CXR showed no edema, NAD; Labs- ok x K=3.0 (she is supposed to be on K20/d);  They gave her Lasix40 in the ER and they wondered about anxiety; Daughter indicates that she had been anxious after she pulled the cord off a ceiling fan & could not fix it; she also states that mother is not regular w/ her meds "she takes what she wants to take" but pt says "I take it when I think I need it"; they cannot agree on placement, they declined home health services; she has Klonopin 0.5mg 52mtake but pt won't take it> daugh says she stays outside on back porch til ~9pm daily ("I breath better outside") then goes in to get ready for bed...     EXAM shows Afeb, VSS, O2sat=93% on RA, wt is down 3#;  Chest-  clear w/o w/r/r;  Heart- RR w/o m/r/g;  Ext- neg x trace edema...   CXR 4/16 in ER> norm heart size, atherosclerosis of Ao, no edema or consolidation in lungs, eventration of right hemidiaph- no change, DJD spine.  EKG 4/16 in ER> NSR, rate72, PACs, LAD, RBBB  LABS 4/16 in ER> Chems- wnl x K=3.0;  CBC- wnl;  Troponin=neg;  BNP=69 PLAN>>  I have instructed pt & daugh to increase her KCl to 20mEq33m & take it regularly; she is encouraged to try the Klonopin 1/2 tab bid regularly for he anxiety...   ~  June 09, 2015:  69moROV & Lavetta indicates that she is doing satis, feels well, no new complaints or concerns, but there is stillalot of tension betw her & daughter shirley who does a fabulous job looking after & caring for her mother... They now have a lady who comes in 4PM-9PM to help MRonneshabut she is not happy- "we just sit and eat ice cream"; [name] (tearful) notes no further anxiety/ panic attacks in the eve hours, mother doesn't want to accept help;  Counseling is recommended but they decline...     Chr Obstructive Asthma> her dyspnea is from anxiety/panic not asthma; on Advair100Bid (but only using it prn) & Proventil rescue prn; notes breathing back to baseline & prn Klonopin helps dyspnea but she won't use it...    HBP> on ASA81, Norvasc5, Lasix20, K10-2/d; BP=126/80, tol meds well; denies CP, palpit, ch in SOB, tr edema...    CHOL> on diet alone, refuses meds, FLP 3/14 shows TChol 177, TG 50, HDL 60, LDL 107    DM> on diet alone, wt stable at 172#, BS=100-120, last A1c (2/13) was 6.3 & she knows to restrict carbs etc...    GI- Reflux, Divertics, constip> prev on Protonix40, Miralax, Senakot-S; continue same meds..[name]   DJD/ LBP> on Pred10, OTC analgesics prn & osteobiflex prn; had right THR 2011; known sp stenosis w/ prev ESI... We reviewed prob list, meds, xrays and labs>  IMP/PLAN>>  Mother & [name] really need counseling but it's not going to happen; they are both wonderful people- Mother  939y/oand has mild senile dementia, daughter very loving & cares for mother deeply, worries, etc; mother needs med supervision and monitoring but refuses AL etc; and so it goes...   ~  July 21, 2015:  6wk ROV & add-on appt after 2 ER visits for dyspnea precipitated by anxiety> as prev noted- very difficult situation w/ 1142y/o mother who insists on living alone, doing her own meds, etc but incapable of doing all that needs to be done & remembering her meds etc;  Daughter has done all she can do as there is alot of history in their relationship- every visit the daughter is the one who cries and mother is just angry;  They have a woman who stays with the pt at night but she won't allow help during the day;  Episodes of dyspnea are ppt by panic attacks yet pt won't take benzos regularly & forgets to take them w/ these attacks- just calls the daugh & they freq wind up in the ER... Pt's son thinks her use of the rescue inhaler is an "upper" & causes problems, he wants her on oxygen instead & I reviewed Medicare guidelines- we will check ambulatory oxygen sat test today & sched ONO, otherw I offered to order Oxygen for them to self-pay but they decline... Pt flat out refused my rec for Klonopin Bid, she has Xanax 0.515mtabs which she uses sparingly, and given Ativan1m48mrom ER but says she doesn't have this med...      ER visit 07/15/15> c/o episode of SOB lasting 68m74mppt by sitting at home w/o AC [facility], Exam was neg w/ norm VS & O2sat=98% on RA, she had sl tender right chest wall, CXR showed norm heart size/ clear lungs/ NAD, EKG showed NSR/ rate88/ RBBB/ LAD, old infer scar/ no acute changes, LABS wnl & enz were neg... Symptoms resolved spont & she was disch home...Marland KitchenMarland Kitchen  ER visit 06/12/15> another episode of SOB & no insight into cause but daugh indicates it was from anxiety/panic when she was home alone; Exam clear, VSS;  CXR was again wnl & Labs wnl as well; they gave her Ativan to try but she  hasn't used it; she was even given an aerochamber to use w/ her inhaler but she won't use that... EXAM reveals Afeb, VSS, O2sat=95% on RA;  HEENT- neg;  Chest- clear w/o w/r/r, sl tender on palp;  Heart- RR gr1/6 SEM no r/g;  Abd- soft, non-tender, neg;  Ext- VI, tr edema, no c/c;  Neuro- intact, walks w/ cane, anxious... We reviewed prob list, meds, xrays and labs> SHE DID NOT BRING MED BOTTLES OR LIST TO THE OV TODAY- reminded to do so for every visit!  CXR 7/24 & 07/15/15 showed norm heart size, tortuous Ao, clear lungs w/ mild elev of right hemidiaph, NAD.Marland KitchenMarland Kitchen  EKG 07/15/15 showed chronic changes- NSR/ rate88/ RBBB/ LAD/ old infer scar/ no acute abnormalities...  LABS 7-06/2015> Chems- wnl x BS=127-147;  BNP=55;  Troponins=neg;  CBC- wnl IMP/PLAN>>  I had another long talk w/ Joycelyn Schmid & her daughter [name]; I have again rec that she use a low dose of the ALPRAZOLAM 0.50m tabs- 1/2 tab Tid regularly at breakfast, lunch, & dinner; she may also take an extra 1/2 tab prn anytime she feels anxious or panic setting in;  We have suggested similar plan many times in the past & for whatever reason she will not do it!  I have offered Psyche referral or to set her up w/ a counselor but this too is declined;  SEnid Derryis having a very hard time w/ her mother's attitude, lack of cooperation, mild dementia at age 82 etc...   ~  October 24, 2015:  368moOV & Becci appears to be stable- mult chr complaints but nothing new & doing reasonably well;  She has moved to [facility]/AL & has a siActuaryired by the family (RKendall Parkand this arrangement seems to be helping (pt likes [place] they get along but pt never misses an opportunity to "stick-it" to her daugh or demean her in some way)... We reviewed the following medical problems during today's office visit >>     Chr Obstructive Asthma> her dyspnea is from anxiety/panic not asthma; on Advair100Bid ([facility] requires regular Rx directions) & Proventil rescue  prn; notes breathing back to baseline & prn Klonopin helps dyspnea...    HBP> on ASA81, Norvasc5, off Lasix20, K10-2/d; BP=140/76, tol meds well; denies CP, palpit, ch in SOB, tr edema...    CHOL> on diet alone, refuses meds, FLP 3/14 shows TChol 177, TG 50, HDL 60, LDL 107    DM> on diet alone, wt up sl at 179#, BS in epic=94-127, last A1c (2/13) was 6.3 & she knows to restrict carbs etc...    GI- Reflux, Divertics, constip> prev on Protonix40, now on Zantac150, Miralax; continue same meds.. [name]  DJD/ LBP> prev on Pred10, OTC analgesics prn & osteobiflex prn; had right THR 2011; known sp stenosis w/ prev ESI... We reviewed prob list, meds, xrays and labs> she had the 2016 Flu vaccine... IMP/PLAN>>  Stable overall, continue current meds, rec to increase exercise & decr intake (work on wt reduction);  We plan ROV recheck w/ blood work in 3 mo...  ~  December 29, 2015:  67m41moV & [name] and her daugh continue to be locked in battle regarding her care (resides  at [facility] & has daytime sitter [place] whom she loves); she persists w/ mult somatic complaints- SOB, irritable, throbbing in her legs, etc;  She saw TP 11/22/15 w/ CC insomnia & fatigue, and her Thyroid function had not been checked for awhile=> labs showed TSH=0.05 and FreeT4=1.70 (prev TSH was 1.93 in 2014); she was referred to DrGherghe & seen 12/01/15> exam of neck was neg, TFT labs proved thyrotoxicosis, TSI was neg as was a Sed rate (14); they chose to try low dose Methimazole rx (11m/d) as opposed to scan/further testing; she has f/u appt w/ DrGherghe pending...     Breathing is stable on Advair100Bid, plus Flonase,Zyrtek, AlbutHFA prn; she continues to c/o SOB/ DOE...    BP is controlled on Amlod5, Demadex20, K10Bid; BP= 116/58 & she denies CP, palpit, ch in edema, etc...    GI- controlled on Zantac150, Miralax...    She remains on Xanax0.25Bid & Desyrel50Qhs prn sleep (she is out of Zoloft)... EXAM reveals Afeb, VSS, O2sat=95% on RA;  HEENT-  neg;  Chest- clear w/o w/r/r;  Heart- RR gr1/6 SEM no r/g;  Abd- soft, non-tender, neg;  Ext- VI, tr edema, no c/c;  Neuro- intact, walks w/ cane, anxious...  LABS 11/2015 in Epic>  TSH=0.05 (confirmed), FreeT4=1.70, FreeT3=4.0, Thy Stim Ig was wnl;  Sed=14...  LABS 12/29/15> [name] insisted on recheck CBC & Iron level>  CBC- wnl w/ Hg=14.6, Fe=99 (31%sat);  Chems- abn w/ Na=130, TCO=39, Cr=1.2 IMP/PLAN>>  We checked the above labs, reminded to use Tylenol prn, no salt, elevate, etc; we decided to decr the Demadex to 1/2 tab Qam & ADD [id number] tab in the afternoon; she is reassured about her health & asked to f/u w/ DrGherghe for Endocrine...  ~  February 09, 2016:  6wk ROV & today is Marsela's 89st birthday!  DPine Hillnotes that pt is better on the Tapazole- less anxious, mood swings diminished, sleeping better, etc; she notes that edema is increased (on Demadex20-1/2 & Diamox250)- reminded to elim sodium etc... We reviewed the following medical problems during today's office visit >>     Chr Obstructive Asthma> her dyspnea is from anxiety/panic not asthma; on Advair100Bid ([facility] requires regular Rx directions) & Proventil rescue prn; notes breathing back to baseline & Alp[raz0.25 helps dyspnea...    HBP> on ASA81, Norvasc5, Demadex20-1/2, Diamox250, K10-2/d; BP=140/70, tol meds well; denies CP, palpit, ch in SOB, but incr edema noted recently...    CHOL> on diet alone, refuses meds, last FLP 3/14 shows TChol 177, TG 50, HDL 60, LDL 107    DM> on diet alone, wt down to 166#, BS in epic=100-150, last A1c (2/13) was 6.3 & she knows to restrict carbs etc...    Thyrotoxicosis> on Tapazole5 per DrGherghe; f/u TFTs 01/10/16 showed TSH=2.39 and she is clinically improved...    GI- Reflux, Divertics, constip> prev on Protonix40, now on Zantac150, Miralax; continue same meds..[name]   DJD/ LBP> prev on Pred10, OTC analgesics prn & osteobiflex prn; had right THR 2011; known sp stenosis w/ prev ESI...    Anxiety,  mild senile dementia, difficult interaction betw pt & daugh> on Xanax0.25Bid & Zoloft50 Qhs EXAM reveals Afeb, VSS, O2sat=93% on RA;  HEENT- neg, no thyroid nodule palp;  Chest- clear w/o w/r/r;  Heart- RR gr1/6 SEM no r/g;  Abd- soft, non-tender, neg;  Ext- VI, w/1+ edema, no c/c;  Neuro- intact, walks w/ cane, anxious...  CXR 01/11/16 showed borderline cardiomeg, elev right hemidiaph, clear lungs, DJD in  Tspine...  LABS 12/2015>  Chems- ok w/ BS=152;  CBC- ok w/ Hg=14.1;  TSH=2.39 on Tapazole5... IMP/PLAN>>  We reviewed need for no salt, elev legs, wear support hose; decided to incr Demedex20 Qam & continue the Diamox250/d; we plan ROV recheck in 59mo..          Problem List:       DYSPNEA (ICD-786.05) - long hx of chronic obstructive asthma treated w/ ADVAIR100Bid & PROAIR (she uses them Prn now)... she is a non-smoker w/ some reactive airways disease in the past & retired from [employer] years in 1991... she denies cough, sputum, hemoptysis, worsening dyspnea, wheezing, chest pains, snoring, daytime hypersomnolence, etc...  ~  baseline CXR w/o acute changes...  ~  PFT's 5/02 w/ FVC 2.07 (68%), FEV1=1.36 (57%), and FEV1/FVC ratio=66%, mid-flows 42%... ~  CT Angio 7/10 was neg- x biapical pleuroparenchymal scarring... ~  CXR 10/11 showed sl elev right hemidaiph, mild DJD sp, osteopenia, NAD..[name] ~  CXR 6/12 showed mild apical scarring, clear & NAD, DJD sp w/ osteophytes... ~  Intermittent dyspnea more related to anxiety & treated w/ KLONOPIN 0.582m1/2 to 1 tab Bid==> improved. ~  CXR 4/13 showed normal heart size, clear lungs, DJD in TSpine... ~  CXR 2/14 showed normal heart size, clear lungs w/ sl peribronch thickening, DJD in spine, NAD...[name]~  CXR 3/15 showed norm heart size, clear lungs, elev of right hemidiaph, NAD... ~  She is encouraged to take the Klonopin 0.32m54mid regularly- consider taking 1/2 in AM, 1/2 in afternoon, one at bedtime... ~  1/16: she continues w/ mult somatic  complaints and intermittent choking episodes etc; she refuses to take the Advair or Klonopin... ~  CXR 1/16 showed norm heart size, clear lungs, mild right diaph eventration- no change, Tspine DJD. DISH/ osteopenia; NAD... ~  3/16: she is stable on current meds, asked to incr her non-existent exercise program... ~  4/16: went to ER w/ SOB- nothing found x mild edema; given Lasix40, potassium was low, thought to be anxious; in office f/u her daugh confirms anxiety & pt not taking her meds regularly; asked to take meds every day & try the Klonopin... ~  7/16: same thing- went to ER w/ dyspnea, nothing found & given Ativan but she wouldn't take it... ~  8/16: another episode w/ ER eval and symptoms resolved spontaneously on there own... ~  9/16: pt is advised (again) to start regular dosing of Alpraz 0.32mg16m1/2 tab Tid w/ extra 1/2 tab as needed for nerves... ~  12/16: she remains on Advair100-2spBid & Alpraz0.232mg37m> improved... ~  3/17: dyspnea further improved w/ Tapazole32mg/d732m for hyperthyroidism...  HYPERTENSION (ICD-401.9) - controlled on NORVASC 32mg da19m & HCTZ 232mgtab3mly...  ~  2/13:  BP 144/70 today> tol rx well & denies HA, visual changes, CP, palipit, dizziness, syncope, edema, etc... ~  4/13:  BP= 148/80 & she denies CP, palpit, edema; dyspnea improved w/ Klonopin. ~  6/13:  BP= 132/68 & as noted she has mult somatic complaints... ~  3/14:  on Norvasc10, HCTZ25; BP=136/60, tol meds well; denies CP, palpit, ch in SOB, edema, etc  ~  9/14:  BP controlled on Amlod5 & Hct25-1/2 daily; BP= 130/70 & she denies CP, palpit, dizzy, SOB, edema, etc. ~  3/15: on ASA81, Norvasc5, Lasix20- 1-2/d; BP=136/70, tol meds well; denies CP, palpit, ch in SOB, but notes persist edema in legs; Rec to ch Amlod5 to Losar50, low sodium, elev legs, Lasix40. ~  5/15: on Losar50, Lasix40;  BP= 138/60 & she denies angina pain, palpit, ch in SOB, etc... ~  7/15: on Losar50, she stopped Lasix; BP= 138/78 and  they want to go back on Amlod5 + HCT12.5 daily... ~  9/15: on Amlod5, Hct12.5; BP= 128/80 & she likes this combo better- denies CP, palpit, etc... ~  1/16: on Amlod5, Hct12.5 but ?what she is taking; BP= 130/80 & she has refused the ARBs... ~  3/16: on Amlod5, Hct12.5; BP= 142/80 & she has gained 9# w/ 1-2+ edema; discussed low sodium & change Hct to LASIX20 Qam... ~  5/16: on Amlod5, Lasix20, K20; BP=148/70, recent K=3.0 & daugh notes she's not taking meds regularly; asked to take meds everyday & incr K20Bid... ~  2/17: BP is controlled on Amlod5, Demadex20, K10Bid; BP= 116/58 & she denies CP, palpit, ch in edema, etc...  RIGHT BUNDLE BRANCH BLOCK (ICD-426.4) - on ASA 55m/d... baseline EKG w/ RBBB and 2DEcho 5/02 showed mild asymmetric LVH w/ incr EF... ~  12/15: she had Cards eval by DrNishan> he rec ARB/diuretic but she refused to switch from her Amlod5/ Hct12.5 regimen; she refused EKG due to "allergy to the electrodes"... ~  2DEcho 1/16 showed norm LV size & function w/ EF=65-70%, AoV leaflets mildly thickened w/o AS, MV leaflets mod thickened w/ trivMR, mild RA dil, PAsys=37mg... ~  EKG 4/16 showed NSR, rate72, PACs, LAD, RBBB  VENOUS INSUFFIC & EDEMA >>  ~  3/15: she was switched off Amlod & onto Losar50, Lasix20=>40, plus no salt, elevation, support hose, etc...  ~  9/15: [facility]; she has VI & 1+edema but stable, no acute changes... 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 ~  3/16: she remains on the Pred from Derm, now w/ incr edema & 9# wt gain, rec no salt & change Hct to LASIX20/d... ~  5/16: improved on Lasix20 w/ edema decr 7 wt down several lbs... ~  12/16: on ASA81, Norvasc5, off Lasix20, K10-2/d; BP=140/76, tol meds well; denies CP, palpit, ch in SOB, tr edema. ~  3/17: edema increased w/ decr in diuretics and [facility] to incr Demadex20/d + Diamox250/d and  K10Bid...  HYPERLIPIDEMIA (ICD-272.4) - on diet alone... she forgets to come to visits FASTING for this blood work ~  [place] showed TChol 205, TG 71, HDL 49, LDL 130... ~  FLP 6/12 on diet alone showed TChol 218, TG 50, HDL 66, LDL 129 ~  FLP 2/13 on diet alone showed TChol 171, TG 43, HDL 66, LDL 97 ~  FLP 3/14 on diet alone showed TChol 177, TG 50, HDL 60, LDL 107 ~  She needs to ret FASTING for f/u FLP...  DIABETES MELLITUS, BORDERLINE (ICD-790.29) - on diet alone w/ prev BS's in the 100-160 range... ~  labs in 2008-9 showed BS= 101 to 108 ~  labs 1/10 showed BS= 106, A1c= 5.9 ~  Labs 6/12 showed BS= 93, A1c= 6.6...Marland KitchenMarland Kitchenec diet, exercise... ~  Labs 2/13 showed BS= 92, A1c= 6.3 ~  3/14: on diet alone, wt stable ~174#, BS=97, last A1c (2/13) was 6.3 & she knows to restrict carbs etc. ~  Labs 3/15 showed BS= 95; and BS= 120 in [id number].  ~  Labs 2016 showed BS= 101-135 on diet alone... ~  Labs in 2017 showed BS= 100-150 range on diet alone  THYROTOXICOSIS >> routine labs 11/2015 showed  thyrotoxicosis & referred to Endocrine, DrGherghe- Rx w/ Tapazole56m/d & she is clinically improved... ~  Labs 2/13 showed TSH= 2.86 ~  Labs 3/14 showed TSH=1.93 ~  Labs 1/17 showed TSH=0.03-0.09;  FreeT3=4.0;  FreeT4=1.70 => referred to DrGherghe, started on Tapazole5 ~  Labs 2/17 showed TSH=2.93;  FreeT3=2.5;  FreeT4=0.76  INDIGESTION/ REFLUX SYMPTOMS >> see 10/12 note & PROTONIX 421md started, further eval if symptoms persist... ESOPHAGEAL DYSMOTILITY/ PRESBYESOPHAGUS >>  ~  4/13:  She notes some reflux symptoms and excess gas w/ belching; rec to take the Protonix daily & Simethacone vs Tums which she says helps her gas. ~  5/13:  She saw GI DrPerry w/ rec to take Prilosec for her indigestion... ~  5/15:  She presented w/ worsening indigestion, dysphagia, reflux & Prev30 was incr to Bid w/ GI f/u suggested for EGD... ~  6-7/15:  She had GI eval by DrPerry> c/o indigestion, food sticking, & pain; we  incr her PPI to Bid & referred to GI- she saw DrPerry 6/15 (note reviewed), he did an UGI series which showed a mod esoph dysmotility problem (likely presbyesoph) but no mucosal abn evident; we reviewed care w/ eating/ swallowing, incr PPI to Bid, elev HOB etc... ~  9/15:  Symptoms persist but she is not regurg or vomiting, and weight stable; they want to change to Nexium40Bid-OK, and rec proceed w/ MBS by speech path... ~  She continues to have intermit choking episodes and c/o phlegm in her throat; she has tried "everything" & encouraged to f/u w/ GI- DrPerry/ DrJEdwards... ~  She stopped the PPI rx in favor of Zantac150...  DIVERTICULOSIS OF COLON (ICD-562.10) - she takes SENAKOT-S, MIRALAX, Peppermint Tea, & sauerkraut Prn...last colonoscopy 9/02 by DrPerry was WNL...  PYELONEPHRITIS (ICD-590.80) - SEE 1/09 Hospitalization (reviewed)... ~  She saw DrMacDiarmid for her recurrent UTIs, chronic cystitis, urge & stress incont, nocturia; [place]  Hx of BREAST CYST (ICD-610.0)  DEGENERATIVE JOINT DISEASE (ICD-715.90) - s/p right hip hemiarthroplasty 11/09 by DrAplington w/ wound complic... then dx w/ loosening of the femoral shaft & had conversion to right THR by DrAlusio 10/11 & much improved... she uses CELEBREX [id number] (seldom takes this).  LOW BACK PAIN SYNDROME (ICD-724.2) & SPINAL STENOSIS (ICD-724.00) - severe LBP & spinal stenosis w/ evals by DrRamos & DrNudelman... s/p shots, considering poss surgery vs alternative therapies... she takes Celebrex, Osteobiflex, MVI, Vit D... ~  8/10: eval by DrAplington- diff leg lengths, lift placed in right shoe, then trial Lyrica50m30m ~  12/11:  improved after hip revision surg (to THR) 10/11 w/ better ambulaton... ~  5/13:  DrRamos gave her another ESI for her leg pain related to sp stenosis... ~  3/14:  C/o neuropathic discomfort in legs- eval by Ramos & offered shots in her back; try Lyrica50 in the interim... ~   Persistent leg pain, on OTC "natural" meds she says; encouraged to f/u w/ DrRamos et al...  Hx of ANEMIA (ICD-285.9) - eval by GI in 2002 showed normal EGD and Colon... prob iron malabsorption problem Rx'd w/ Fe infusion... ~  labs 1/10 showed Hg= 14.6, MCV= 89, Fe= 94 ~  labs 12/11 showed Hg= 12.8, MCV= 90, Fe= 33... try Fe supplement + VitC... ~  Labs 6/12 showed Hg= 15.0 ~  Labs 2/13 showed Hg= 14.6 ~  Labs 2/14 showed Hg= 14.8 ~  Labs 3/15 showed Hg= 14.9 ~  Labs 1/16 showed Hg= 14.4 ~  Labs 4/16 showed Hg=  13.7 ~  Labs 2/17 showed Hg= 14.1  DERM:  rash Rx'd by dermatology- OLUX-E foam= clobetasol Foam 0.05%... 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 ~  5/16: she is still on Pred per Derm- currently 28m/d...    Past Surgical History  Procedure Laterality Date  . Cataract extraction    . Right hip hemiarthroplasty      total  . Conversion to right thr      Outpatient Encounter Prescriptions as of 10/24/2015  Medication Sig  . ALPRAZolam (XANAX) 0.25 MG tablet Take 1 tablet by mouth twice daily  . aspirin EC 81 MG tablet Take 81 mg by mouth daily.  . Cholecalciferol (VITAMIN D3) 2000 UNITS capsule Take 1 capsule (2,000 Units total) by mouth daily.  . diphenhydramine-acetaminophen (TYLENOL PM) 25-500 MG TABS tablet Take 1 tablet by mouth at bedtime as needed.  . Fluticasone-Salmeterol (ADVAIR DISKUS) 100-50 MCG/DOSE AEPB Inhale 1 puff into the lungs 2 (two) times daily.  . Multiple Vitamin (MULTIVITAMIN WITH MINERALS) TABS tablet Take 1 tablet by mouth daily. Centrum Silver  . NORVASC 5 MG tablet TAKE 1 TABLET ONCE DAILY.  .Marland Kitchenpolyethylene glycol (MIRALAX / GLYCOLAX) packet Take 17 g by mouth daily as needed for mild constipation.   . potassium chloride (K-DUR,KLOR-CON) 10 MEQ tablet Take 2 tablets (20 mEq total) by mouth daily.  .Marland KitchenPROAIR HFA 108 (90 BASE) MCG/ACT inhaler INHALE 2 PUFFS EVERY 4 HOURS AS NEEDED  .  ranitidine (ZANTAC) 150 MG tablet Take 150 mg by mouth daily.  . sertraline (ZOLOFT) 50 MG tablet TAKE (1) TABLET BY MOUTH AT BEDTIME.  .Marland KitchenSpacer/Aero-Holding Chambers (AEROCHAMBER PLUS WITH MASK) inhaler Use as instructed  . Alum & Mag Hydroxide-Simeth (MAGIC MOUTHWASH W/LIDOCAINE) SOLN Take 5 mLs by mouth 4 (four) times daily as needed for mouth pain. (Patient not taking: Reported on 10/24/2015)  . BENZOCAINE, DENTAL, (ANBESOL MAXIMUM STRENGTH) 20 % LIQD Place 1 application onto teeth daily as needed (gum irritation).  . ferrous sulfate 325 (65 FE) MG tablet Take 325 mg by mouth daily with breakfast.(Pt not taking 10/24/15)   . furosemide (LASIX) 20 MG tablet Take 1 tablet (20 mg total) by mouth daily. (Patient not taking: Reported on 10/24/2015)  . magnesium hydroxide (MILK OF MAGNESIA) 800 MG/5ML suspension Take 30 mLs by mouth daily as needed for constipation. (Pt not taking 10/24/15)  . OVER THE COUNTER MEDICATION Take 1 application by mouth daily as needed (sinus congestion). Rawleigh's Salve - apply to nostrils (Pt not taking 10/24/15)  . predniSONE (DELTASONE) 10 MG tablet Take 5 mg by mouth See admin instructions. Take 1/2 tablet (5 mg) daily for 3 days for itching; hold for 10 days, then restart if needed for itching rash on back. (Pt not taking 10/24/15)    Allergies  Allergen Reactions  . Azithromycin Shortness Of Breath  . Ciprofloxacin Other (See Comments)     hallucinations  . Levofloxacin Other (See Comments)    Insomnia, indigestion, tingling sensation in legs  . Furosemide Rash    Bullous pemphigoid  . Latex Rash  . Other Rash    EKG leads caused a rash that required steroids to clear    Current Medications, Allergies, Past Medical History, Past Surgical History, Family History, and Social History were reviewed in [employer].    Review of Systems         See HPI - all other systems neg  except as noted... The patient complains of decreased  hearing, dyspnea on exertion, muscle weakness, and difficulty walking.  The patient denies anorexia, fever, weight loss, weight gain, vision loss, hoarseness, chest pain, syncope, peripheral edema, prolonged cough, headaches, hemoptysis, abdominal pain, melena, hematochezia, severe indigestion/heartburn, hematuria, incontinence, suspicious skin lesions, transient blindness, depression, unusual weight change, abnormal bleeding, enlarged lymph nodes, and angioedema.     Objective:   Physical Exam     WD, WN, Chr ill appearing 82 y/o WF in NAD... GENERAL:  Alert & oriented; pleasant & cooperative... HEENT:  [place]/AT, EOM-full, EACs-clear, TMs-wnl, NOSE-clear, THROAT-clear & wnl. NECK:  Supple w/ fairROM; no JVD; normal carotid impulses w/o bruits; no thyromegaly or nodules palpated; no lymphadenopathy. CHEST:  Clear to P & A; without wheezes/ rales/ or rhonchi heard... HEART:  Regular Rhythm; without murmurs/ rubs/ or gallops detected... ABDOMEN:  Soft & nontender; normal bowel sounds; no organomegaly or masses palpated... EXT:  mod arthritic changes, walks w/ cane, +venous insuffic & incr 1-2+ edema., scattered varicose veins... NEURO:  CN's intact; motor testing normal; no focal deficits... DERM:   mild intertrig rash under breast, & onychomycosis of toenails...  RADIOLOGY DATA:  Reviewed in the EPIC EMR & discussed w/ the patient...  LABORATORY DATA:  Reviewed in the EPIC EMR & discussed w/ the patient...   Assessment & Plan:    Hx episodes of SOB>> see above, she has been resistent to trying any of the meds & treatment suggestions that we have given to her (I know she would improve on Benzo rx)...  12/16> symptoms are better w/ Advair100-2spBid & Alprazolam 0.42mBid admin regularly at [facility]... 12/29/15> we checked the above labs, reminded to use Tylenol prn, no salt, elevate, etc; we decided to decr the Demadex to 1/2 tab Qam & ADD [id number] tab in the afternoon; she is reassured  about her health & asked to f/u w/ DrGherghe for Endocrine re thyroid...  DYSPNEA>  Hx asthma, stable off Advair, on Proair prn; hx anxiety component on Klonopin in past but she is not using! Now on Alpraz & improved.  HBP>  Controlled on Amlod5 + Demadex + Diamox, & K20/d; reminded to take meds regularly & no salt...  RBBB>  Aware & denies CP, palpit, ch in DOE, etc... she thinks that she is allergic to EKG electrodes; EKG 4./16 in ER showed NSR, rate72, PACs, LAD, RBBB  CHOL>  On diet alone & FLP looks reasonable;  We reviewed low chol, low fat diet...  DM>  BS= 101-135 & last A1c is 6.3;  on diet alone & we reviewed low carb no sweets etc...  Thyrotoxicosis>  Improved on Tapazole5 per DrGherghe, continue same...  GI> Indigestion, Divertics> she notes most bowel symptoms resolved off spicey foods;  UGI symptoms w/ dysphagia, food sticking, belch/gas/ etc have not resolved on Protonix40Bid and after GI consult w/ DrPerry; we reviewed her UGI series results and decided to proceed w/ MBS by Speech Path; transiently improved then sought 2nd opinion from DrJEdwards=> on Nexium40Bid...  UTI>  Klebsiella UTI resolved after Septra Rx... She has been eval by DrMacDiarmid.  DJD, LBP, Spinal Stenosis>  Prev evals by Ortho, DrRamos, DrNudelman etc; improved after THR w/ better ambulation; c/o neuropathic discomfort in legs- she will f/u w/ Ramos for shots, using OTC "natural" meds...  Anxiety>  If she would take the Alpraz, I feel it would help...   Patient's Medications  New Prescriptions   No medications on file  Previous Medications  ACETAZOLAMIDE (DIAMOX) 250 MG TABLET    TAKE (1) TABLET BY MOUTH EACH MORNING.   ALPRAZOLAM (XANAX) 0.25 MG TABLET    Take 1 tablet by mouth twice daily   ASPIRIN EC 81 MG TABLET    Take 81 mg by mouth daily.   CETIRIZINE (ZYRTEC) 10 MG TABLET    TAKE 1 TABLET BY MOUTH ONCE DAILY FOR ALLERGIES.   CHOLECALCIFEROL (VITAMIN D3) 2000 UNITS CAPSULE    Take 1 capsule  (2,000 Units total) by mouth daily.   FLUTICASONE (FLONASE) 50 MCG/ACT NASAL SPRAY       FLUTICASONE-SALMETEROL (ADVAIR DISKUS) 100-50 MCG/DOSE AEPB    Inhale 1 puff into the lungs 2 (two) times daily.   GUAIFENESIN (ROBITUSSIN) 100 MG/5ML SYRUP    Take 200 mg by mouth 3 (three) times daily as needed for cough.   METHIMAZOLE (TAPAZOLE) 5 MG TABLET    Take 1 tablet (5 mg total) by mouth daily.   MULTIPLE VITAMIN (MULTIVITAMIN WITH MINERALS) TABS TABLET    Take 1 tablet by mouth daily. Centrum Silver   NORVASC 5 MG TABLET    TAKE 1 TABLET ONCE DAILY.   POLYETHYLENE GLYCOL (MIRALAX / GLYCOLAX) PACKET    Take 17 g by mouth daily as needed for mild constipation.    POTASSIUM CHLORIDE (K-DUR,KLOR-CON) 10 MEQ TABLET    Take 2 tablets (20 mEq total) by mouth daily.   PROAIR HFA 108 (90 BASE) MCG/ACT INHALER    INHALE 2 PUFFS EVERY 4 HOURS AS NEEDED   RANITIDINE (ZANTAC) 150 MG TABLET    Take 150 mg by mouth daily.   SERTRALINE (ZOLOFT) 50 MG TABLET    TAKE (1) TABLET BY MOUTH AT BEDTIME.   SPACER/AERO-HOLDING CHAMBERS (AEROCHAMBER PLUS WITH MASK) INHALER    Use as instructed   TORSEMIDE (DEMADEX) 20 MG TABLET    TAKE 1/2 TABLET BY MOUTH EVERY MORNING.  Modified Medications   No medications on file  Discontinued Medications   ACETAMINOPHEN (TYLENOL) 325 MG TABLET    Take 325 mg by mouth every 6 (six) hours as needed for moderate pain.   POTASSIUM CHLORIDE (K-DUR) 10 MEQ TABLET

## 2016-02-29 ENCOUNTER — Other Ambulatory Visit: Payer: Self-pay | Admitting: Pulmonary Disease

## 2016-03-01 ENCOUNTER — Encounter: Payer: Self-pay | Admitting: Internal Medicine

## 2016-03-01 ENCOUNTER — Encounter: Payer: Self-pay | Admitting: *Deleted

## 2016-03-01 ENCOUNTER — Ambulatory Visit (INDEPENDENT_AMBULATORY_CARE_PROVIDER_SITE_OTHER): Payer: Medicare Other | Admitting: Internal Medicine

## 2016-03-01 ENCOUNTER — Other Ambulatory Visit: Payer: Self-pay | Admitting: *Deleted

## 2016-03-01 VITALS — BP 124/70 | HR 76 | Temp 97.6°F | Resp 12 | Wt 185.8 lb

## 2016-03-01 DIAGNOSIS — E0580 Other thyrotoxicosis without thyrotoxic crisis or storm: Secondary | ICD-10-CM | POA: Diagnosis not present

## 2016-03-01 LAB — TSH: TSH: 7.97 u[IU]/mL — ABNORMAL HIGH (ref 0.35–4.50)

## 2016-03-01 LAB — T4, FREE: Free T4: 0.62 ng/dL (ref 0.60–1.60)

## 2016-03-01 LAB — T3, FREE: T3 FREE: 2.9 pg/mL (ref 2.3–4.2)

## 2016-03-01 MED ORDER — METHIMAZOLE 5 MG PO TABS: 2.5000 mg | ORAL_TABLET | Freq: Every day | ORAL | Status: DC

## 2016-03-01 MED ORDER — METHIMAZOLE 5 MG PO TABS: 5.0000 mg | ORAL_TABLET | ORAL | Status: DC

## 2016-03-01 NOTE — Progress Notes (Signed)
Patient ID: [name], female   DOB: [birth year], 82 y.o.   MRN: [id number]   HPI  [name] is a 82 y.o.-year-old female,  returning for follow-up for thyrotoxicosis. Last visit 3 months ago. She is here with her daughter [name]) who is also my pt.   Reviewed and addended history: Pt developed anxiety attacks/panic attacks/sundowning >> in ED 6x since last summer per daughter's report. She also gets SOB with any activity, increased appetite and has insomnia. She has nocturia and leg swelling.  TFTs returned abnormal when checked in 11/2015, indicating thyrotoxicosis.  Due to age and also due to the fact that the TFTs appeared to be improving when I saw her last, we decided to start a low dose methimazole, but we did not pursue further investigation, assuming either mild Graves ds or toxic adenoma, both amenable to MMI.  At last visit, we started methimazole 5 mg once daily. For a week she took it 3 times a day, due to a mixup, however, afterwards, she started to take it daily. TFTs normalized at last check one half months ago.  She has a better attitude and less anxiety, per daughter's report.  She is living in [facility] (ALF).  I reviewed pt's thyroid tests: Lab Results  Component Value Date   TSH 2.39 01/10/2016   TSH 0.09* 12/01/2015   TSH 0.03* 11/23/2015   TSH 0.05 Repeated and verified X2.* 14-Oct-202017   TSH 1.93 02/10/2013   TSH 2.86 01/17/2012   TSH 1.53 05/14/2011   TSH 2.42 12/13/2008   TSH 1.84 10/28/2007   TSH 2.74 12/27/2006   FREET4 0.76 01/10/2016   FREET4 1.47 12/01/2015   FREET4 1.70* 11/23/2015    Component     Latest Ref Rng 12/01/2015  TSI     <140 % baseline 88  Sed Rate     0 - 22 mm/hr 14   Pt denies feeling nodules in neck, hoarseness, dysphagia/odynophagia, SOB with lying down; she c/o: - + fatigue - + Weight gain - + tremors, but better - no excessive sweating/heat intolerance - no palpitations - no hyperdefecation -  no hair loss  Pt does not have a FH of thyroid ds. No FH of thyroid cancer. No h/o radiation tx to head or neck.  No seaweed or kelp, no recent contrast studies. No steroid use. No herbal supplements. No Biotin use.  I reviewed her chart and she also has a history of bullous pemphigoid >> on Prednisone >> now off.  ROS: Constitutional: see HPI, + nocturia - recently increase diuretic dose Eyes: no blurry vision, no xerophthalmia ENT: no sore throat, no nodules palpated in throat, no dysphagia/odynophagia, no hoarseness Cardiovascular: no CP/+ SOB/no palpitations/+ leg swelling Respiratory: no cough/+ SOB Gastrointestinal: no N/V/D/C, + heartburn Musculoskeletal: + muscle aches/+ joint aches Skin: No rashes; redness and swelling right upper eyelid Neurological: Less tremors/no numbness/tingling/dizziness  I reviewed pt's medications, allergies, PMH, social hx, family hx, and changes were documented in the history of present illness. Otherwise, unchanged from my initial visit note.  Past Medical History  Diagnosis Date  . Shortness of breath   . Unspecified essential hypertension   . Right bundle branch block   . Other and unspecified hyperlipidemia   . Other abnormal glucose   . Diverticulosis of colon (without mention of hemorrhage)   . Pyelonephritis, unspecified   . Solitary cyst of breast   . Osteoarthrosis, unspecified whether generalized or localized, unspecified site   .  Lumbago   . Spinal stenosis, unspecified region other than cervical   . Anemia, unspecified   . GERD (gastroesophageal reflux disease)   . Pyelonephritis   . DJD (degenerative joint disease)   . UTI (lower urinary tract infection)    Past Surgical History  Procedure Laterality Date  . Cataract extraction    . Right hip hemiarthroplasty      total  . Conversion to right thr     Social History   Social History  . Marital Status: Divorced    Spouse Name: N/A  . Number of Children: 3- 1 deceased    Occupational History  . retired    Social History Main Topics  . Smoking status: Never Smoker   . Smokeless tobacco: Never Used  . Alcohol Use: No  . Drug Use: No   Pt lives in [place].  Current Outpatient Prescriptions on File Prior to Visit  Medication Sig Dispense Refill  . acetaZOLAMIDE (DIAMOX) 250 MG tablet TAKE (1) TABLET BY MOUTH EACH MORNING. 30 tablet 5  . ALPRAZolam (XANAX) 0.25 MG tablet Take 1 tablet by mouth twice daily 60 tablet 0  . aspirin EC 81 MG tablet Take 81 mg by mouth daily.    . cetirizine (ZYRTEC) 10 MG tablet TAKE 1 TABLET BY MOUTH ONCE DAILY FOR ALLERGIES. 30 tablet 0  . Cholecalciferol (VITAMIN D3) 2000 UNITS capsule Take 1 capsule (2,000 Units total) by mouth daily. 30 capsule 6  . fluticasone (FLONASE) 50 MCG/ACT nasal spray     . Fluticasone-Salmeterol (ADVAIR DISKUS) 100-50 MCG/DOSE AEPB Inhale 1 puff into the lungs 2 (two) times daily. 60 each 6  . guaifenesin (ROBITUSSIN) 100 MG/5ML syrup Take 200 mg by mouth 3 (three) times daily as needed for cough.    . methimazole (TAPAZOLE) 5 MG tablet Take 1 tablet (5 mg total) by mouth daily. 60 tablet 2  . Multiple Vitamin (MULTIVITAMIN WITH MINERALS) TABS tablet Take 1 tablet by mouth daily. Centrum Silver    . NORVASC 5 MG tablet TAKE 1 TABLET ONCE DAILY. 30 tablet 6  . polyethylene glycol (MIRALAX / GLYCOLAX) packet Take 17 g by mouth daily as needed for mild constipation.     . potassium chloride (K-DUR,KLOR-CON) 10 MEQ tablet Take 2 tablets (20 mEq total) by mouth daily. 30 tablet 1  . PROAIR HFA 108 (90 BASE) MCG/ACT inhaler INHALE 2 PUFFS EVERY 4 HOURS AS NEEDED 8.5 g 0  . ranitidine (ZANTAC) 150 MG tablet Take 150 mg by mouth daily.    . sertraline (ZOLOFT) 50 MG tablet TAKE (1) TABLET BY MOUTH AT BEDTIME. 30 tablet 5  . Spacer/Aero-Holding Chambers (AEROCHAMBER PLUS WITH MASK) inhaler Use as instructed 1 each 2  . torsemide (DEMADEX) 20 MG tablet TAKE 1/2 TABLET BY MOUTH EVERY MORNING.  (Patient taking differently: TAKE 1 TABLET BY MOUTH EVERY MORNING.) 15 tablet 11   No current facility-administered medications on file prior to visit.   Allergies  Allergen Reactions  . Azithromycin Shortness Of Breath  . Ciprofloxacin Other (See Comments)     hallucinations  . Levofloxacin Other (See Comments)    Insomnia, indigestion, tingling sensation in legs  . Furosemide Rash    Bullous pemphigoid  . Latex Rash  . Other Rash    EKG leads caused a rash that required steroids to clear   Family History  Problem Relation Age of Onset  . Cancer Brother   . Cancer Brother    PE: BP 124/70 mmHg  Pulse 76  Temp(Src) 97.6 F (36.4 C) (Oral)  Resp 12  Wt 185 lb 12.8 oz (84.278 kg)  SpO2 90% Body mass index is 28.65 kg/(m^2). Wt Readings from Last 3 Encounters:  03/01/16 185 lb 12.8 oz (84.278 kg)  02/09/16 166 lb (75.297 kg)  01/11/16 181 lb 9.6 oz (82.373 kg)   Constitutional: overweight, in NAD Eyes: PERRLA, EOMI, no exophthalmos, no lid lag, no stare ENT: moist mucous membranes, no thyromegaly, no thyroid bruits, no cervical lymphadenopathy Cardiovascular: RRR, + 1/6 SEM, no RG, + B LE pitting edema +2 Respiratory: CTA B Gastrointestinal: abdomen soft, NT, ND, BS+ Musculoskeletal: no deformities, strength intact in all 4 Skin: moist, warm,+ stasis dermatitis B legs, hordeolum right upper eyelid Neurological: Mild tremor with outstretched hands, DTR normal in all 4  ASSESSMENT: 1. Thyrotoxicosis  PLAN:  1. Patient with low TSH + high fT4 in 11/2015, with thyrotoxic sxs: anxiety, Irritability, tremors, poor sleep. Now on methimazole 5 mg daily with good improvement in anxiety, irritability, but we still poor sleep/nocturia. - At last visit, discussed that possible causes of thyrotoxicosis are:  Thyroiditis Graves ds   toxic multinodular goiter/ toxic adenoma Due to age and the fact that her TFTs were improving, we skipped further investigation for her  thyrotoxicosis, assuming either mild Graves' disease (TSI antibodies were not elevated) or toxic adenoma. We started 5 mg methimazole and her TFTs normalized in 12/2015.  -  we will check a TSH, fT3 and fT4 today and adjust methimazole dose accordingly - I do not feel that we need to add beta blockers at this time, since she is not tachycardic. - I advised her to join my chart to communicate easier - RTC in 3 months, but likely sooner for repeat labs  Will need to call Dtr with results: [phone] [name]([name] Broome).  Office Visit on 03/01/2016  Component Date Value Ref Range Status  . TSH 03/01/2016 7.97* 0.35 - 4.50 uIU/mL Final  . Free T4 03/01/2016 0.62  0.60 - 1.60 ng/dL Final  . T3, Free 09/81/191404/13/2017 2.9  2.3 - 4.2 pg/mL Final  TSH increased to >ULN >> decrease MMI to 2.5 mg daily (can take 5 mg qod). Plan to repeat TFTs in 2 months.

## 2016-03-01 NOTE — Patient Instructions (Signed)
Please stop at the lab.  Please continue Methimazole 5 mg daily.  Please return in 6 months.  

## 2016-03-12 ENCOUNTER — Telehealth: Payer: Self-pay | Admitting: Internal Medicine

## 2016-03-12 NOTE — Telephone Encounter (Signed)
It is not time to repeat her labs yet, she had them 11 days ago. We'll need to wait at least 5 weeks from the last check until we can repeat them. Since her TSH was almost 8, so above the upper limit of normal, we reduced the methimazole to 2.5 mg daily. I doubt that this would cause her to be more irritable, but will need to wait until next lab check to know for sure.

## 2016-03-12 NOTE — Telephone Encounter (Signed)
Please read message below and advise.  

## 2016-03-12 NOTE — Telephone Encounter (Signed)
Called pt's daughter, [name] and advised her per Dr Charlean SanfilippoGherghe's message. She voiced understanding and will call pt's PCP.

## 2016-03-12 NOTE — Telephone Encounter (Signed)
Pt has had some attitude changes for the worse, she is much more irritable please advise on if we should adjust the thyroid meds again

## 2016-03-15 ENCOUNTER — Other Ambulatory Visit: Payer: Self-pay | Admitting: Pulmonary Disease

## 2016-03-16 ENCOUNTER — Other Ambulatory Visit: Payer: Self-pay | Admitting: Acute Care

## 2016-03-19 ENCOUNTER — Other Ambulatory Visit: Payer: Self-pay | Admitting: Pulmonary Disease

## 2016-03-20 ENCOUNTER — Telehealth: Payer: Self-pay | Admitting: Pulmonary Disease

## 2016-03-20 NOTE — Telephone Encounter (Signed)
Per SN- Yes ok to refill.   Spoke with pharmacist, aware of SN's recs. Nothing further needed.

## 2016-03-20 NOTE — Telephone Encounter (Signed)
[facility] calling to get refill on Alprazolam, last refilled: 02/23/16, pharmacy calling early because they are putting together her monthly pill packs for the home and needs to get this approved to get the pack together.   Dr. Kriste BasqueNadel, please advise if ok to refill.  Current Outpatient Prescriptions on File Prior to Visit  Medication Sig Dispense Refill  . acetaZOLAMIDE (DIAMOX) 250 MG tablet TAKE (1) TABLET BY MOUTH EACH MORNING. 30 tablet 5  . ALPRAZolam (XANAX) 0.25 MG tablet Take 1 tablet by mouth twice daily 60 tablet 0  . aspirin EC 81 MG tablet Take 81 mg by mouth daily.    . cetirizine (ZYRTEC) 10 MG tablet TAKE 1 TABLET BY MOUTH ONCE DAILY FOR ALLERGIES. 30 tablet 5  . Cholecalciferol (VITAMIN D3) 2000 UNITS capsule Take 1 capsule (2,000 Units total) by mouth daily. 30 capsule 6  . fluticasone (FLONASE) 50 MCG/ACT nasal spray SPRAY 2 SPRAYS INTO EACH NOSTRIL ONCE DAILY. 16 g 5  . Fluticasone-Salmeterol (ADVAIR DISKUS) 100-50 MCG/DOSE AEPB Inhale 1 puff into the lungs 2 (two) times daily. 60 each 6  . guaifenesin (ROBITUSSIN) 100 MG/5ML syrup Take 200 mg by mouth 3 (three) times daily as needed for cough.    . methimazole (TAPAZOLE) 5 MG tablet Take 1 tablet (5 mg total) by mouth every other day. 30 tablet 1  . Multiple Vitamin (MULTIVITAMIN WITH MINERALS) TABS tablet Take 1 tablet by mouth daily. Centrum Silver    . NORVASC 5 MG tablet TAKE 1 TABLET ONCE DAILY. 30 tablet 6  . polyethylene glycol (MIRALAX / GLYCOLAX) packet Take 17 g by mouth daily as needed for mild constipation.     . potassium chloride (K-DUR) 10 MEQ tablet Take 2 tablets (20 mEq total) by mouth 2 (two) times daily. 120 tablet 1  . potassium chloride (K-DUR,KLOR-CON) 10 MEQ tablet Take 2 tablets (20 mEq total) by mouth daily. 30 tablet 1  . PROAIR HFA 108 (90 BASE) MCG/ACT inhaler INHALE 2 PUFFS EVERY 4 HOURS AS NEEDED 8.5 g 0  . ranitidine (ZANTAC) 150 MG tablet Take 150 mg by mouth daily.    . sertraline (ZOLOFT) 50 MG  tablet TAKE (1) TABLET BY MOUTH AT BEDTIME. 30 tablet 5  . Spacer/Aero-Holding Chambers (AEROCHAMBER PLUS WITH MASK) inhaler Use as instructed 1 each 2  . torsemide (DEMADEX) 20 MG tablet TAKE 1/2 TABLET BY MOUTH EVERY MORNING. (Patient taking differently: TAKE 1 TABLET BY MOUTH EVERY MORNING.) 15 tablet 11   No current facility-administered medications on file prior to visit.   Allergies  Allergen Reactions  . Azithromycin Shortness Of Breath  . Ciprofloxacin Other (See Comments)     hallucinations  . Levofloxacin Other (See Comments)    Insomnia, indigestion, tingling sensation in legs  . Furosemide Rash    Bullous pemphigoid  . Latex Rash  . Other Rash    EKG leads caused a rash that required steroids to clear

## 2016-03-22 ENCOUNTER — Other Ambulatory Visit: Payer: Self-pay | Admitting: Pulmonary Disease

## 2016-03-23 ENCOUNTER — Telehealth: Payer: Self-pay | Admitting: Pulmonary Disease

## 2016-03-23 ENCOUNTER — Other Ambulatory Visit: Payer: Self-pay | Admitting: Pulmonary Disease

## 2016-03-23 MED ORDER — SERTRALINE HCL 50 MG PO TABS: ORAL_TABLET | ORAL | Status: DC

## 2016-03-23 MED ORDER — AMLODIPINE BESYLATE 5 MG PO TABS: 5.0000 mg | ORAL_TABLET | Freq: Every day | ORAL | Status: DC

## 2016-03-23 NOTE — Telephone Encounter (Signed)
[facility] Nothing further needed.

## 2016-03-23 NOTE — Telephone Encounter (Signed)
Called spoke with [facility] at [facility]. She states that the patient needs refills on   Norvasc 5mg  Take 1 tablet by mouth daily #30 with 6 refills Last refilled 12/24/14  Zoloft 50mg  Take 1 tablet by mouth at bedtime #30 with 5 refills Last refilled 09/23/15  I explained to her that I would need approval from SN. He voiced understanding and had no further questions.   SN please advise if okay to refill

## 2016-03-23 NOTE — Telephone Encounter (Signed)
Per SN: ok to refill both.  Please remove Trazodone from her med list.  Thanks.

## 2016-03-26 ENCOUNTER — Telehealth: Payer: Self-pay | Admitting: Pulmonary Disease

## 2016-03-26 MED ORDER — ALPRAZOLAM 0.25 MG PO TABS: 0.2500 mg | ORAL_TABLET | Freq: Every evening | ORAL | Status: DC | PRN

## 2016-03-26 NOTE — Telephone Encounter (Signed)
Pt had a duplicate appt for 03/27/2016 to see SN, pt was last advised to f/u in late June 2017. Called and spoke to pt's daughter to advise this upcoming appt was not necessary unless they felt they needed to be seen by SN. Pt's daughter verbalized understanding and stated she does not feel pt needs to come in but did express pt is still having difficulty sleeping at night and is mainly sleeping during the day. Pt's daughter, [name], stated Trazadone was recently prescribed but did not work. Spoke with SN about concern, per SN: Ok to do two Xanax 0.25mg  tabs at bedtime to help with sleep. Rx sent to preferred pharmacy and pt's appt cancelled for 03/27/2016. [name] verbalized understanding and denied any further questions or concerns at this time.

## 2016-03-27 ENCOUNTER — Ambulatory Visit: Payer: Medicare Other | Admitting: Pulmonary Disease

## 2016-03-30 ENCOUNTER — Other Ambulatory Visit: Payer: Self-pay | Admitting: Pulmonary Disease

## 2016-04-02 ENCOUNTER — Other Ambulatory Visit: Payer: Self-pay | Admitting: Pulmonary Disease

## 2016-04-09 ENCOUNTER — Other Ambulatory Visit: Payer: Self-pay | Admitting: Pulmonary Disease

## 2016-04-11 ENCOUNTER — Other Ambulatory Visit: Payer: Self-pay | Admitting: Pulmonary Disease

## 2016-04-12 ENCOUNTER — Other Ambulatory Visit (INDEPENDENT_AMBULATORY_CARE_PROVIDER_SITE_OTHER): Payer: Medicare Other

## 2016-04-12 ENCOUNTER — Other Ambulatory Visit: Payer: Self-pay | Admitting: *Deleted

## 2016-04-12 DIAGNOSIS — E0590 Thyrotoxicosis, unspecified without thyrotoxic crisis or storm: Secondary | ICD-10-CM

## 2016-04-12 DIAGNOSIS — E0580 Other thyrotoxicosis without thyrotoxic crisis or storm: Secondary | ICD-10-CM | POA: Diagnosis not present

## 2016-04-12 LAB — T4, FREE: Free T4: 0.57 ng/dL — ABNORMAL LOW (ref 0.60–1.60)

## 2016-04-12 LAB — TSH: TSH: 3.87 u[IU]/mL (ref 0.35–4.50)

## 2016-04-12 LAB — T3, FREE: T3 FREE: 2.4 pg/mL (ref 2.3–4.2)

## 2016-04-18 ENCOUNTER — Other Ambulatory Visit: Payer: Self-pay | Admitting: Pulmonary Disease

## 2016-04-18 NOTE — Telephone Encounter (Signed)
[facility] [place] for Acetaminophen 325mg  1po every 4 hours as needed for pain #30 Last ov 3.23.17 w/ SN, upcoming scheduled for 6.26.17 Refills already sent on 5.26.17

## 2016-04-19 ENCOUNTER — Ambulatory Visit (INDEPENDENT_AMBULATORY_CARE_PROVIDER_SITE_OTHER): Payer: Medicare Other | Admitting: Podiatry

## 2016-04-19 ENCOUNTER — Encounter: Payer: Self-pay | Admitting: Podiatry

## 2016-04-19 DIAGNOSIS — B351 Tinea unguium: Secondary | ICD-10-CM | POA: Diagnosis not present

## 2016-04-19 DIAGNOSIS — M79674 Pain in right toe(s): Secondary | ICD-10-CM | POA: Diagnosis not present

## 2016-04-19 DIAGNOSIS — M79675 Pain in left toe(s): Secondary | ICD-10-CM | POA: Diagnosis not present

## 2016-04-19 DIAGNOSIS — M79676 Pain in unspecified toe(s): Secondary | ICD-10-CM | POA: Diagnosis not present

## 2016-04-19 NOTE — Progress Notes (Signed)
Subjective:     Patient ID: Karleen DolphinMargaret J Wigley, female   DOB: 03-08-1923, 82 y.o.   MRN: [id number]  HPI this patient is a resident of [facility]. She says that she was taking to a nail salon and she was referred to this office for alignment and treatment of her thick big toenails, both feet. She has thick disfigured discolored nails, which are painful walking and wearing her shoes. No evidence for any redness, swelling or infection. Patient presents for preventative routine foot care services   Review of Systems     Objective:   Physical Exam GENERAL APPEARANCE: Alert, conversant. Appropriately groomed. No acute distress.  VASCULAR: Pedal pulses palpable at  [facility] and PT bilateral.  Capillary refill time is immediate to all digits,  Normal temperature gradient.  Digital hair growth is present bilateral  NEUROLOGIC: sensation is normal to 5.07 monofilament at 5/5 sites bilateral.  Light touch is intact bilateral, Muscle strength normal.  MUSCULOSKELETAL: acceptable muscle strength, tone and stability bilateral.  Intrinsic muscluature intact bilateral.  Rectus appearance of foot and digits noted bilateral.   DERMATOLOGIC: skin color, texture, and turgor are within normal limits.  No preulcerative lesions or ulcers  are seen, no interdigital maceration noted.  No open lesions present.  . No drainage noted. NAILS  Thick disfigured discolored hallux nails both feet.  Her left hallux toenail appears to be due to trauma.  Her right hallux is thickened pincer toenail.     Assessment:     Onychomycosis  B/L    Plan:     IE  Debridement of hallux nails.  RTC 3 months.  Helane GuntherGregory Sheryn Aldaz DPM

## 2016-04-20 ENCOUNTER — Ambulatory Visit (INDEPENDENT_AMBULATORY_CARE_PROVIDER_SITE_OTHER)
Admission: RE | Admit: 2016-04-20 | Discharge: 2016-04-20 | Disposition: A | Discharge status: Home / Self Care | Payer: Medicare Other | Source: Ambulatory Visit | Attending: Pulmonary Disease | Admitting: Pulmonary Disease

## 2016-04-20 ENCOUNTER — Other Ambulatory Visit (INDEPENDENT_AMBULATORY_CARE_PROVIDER_SITE_OTHER): Payer: Medicare Other

## 2016-04-20 ENCOUNTER — Telehealth: Payer: Self-pay | Admitting: Pulmonary Disease

## 2016-04-20 ENCOUNTER — Ambulatory Visit (INDEPENDENT_AMBULATORY_CARE_PROVIDER_SITE_OTHER): Payer: Medicare Other | Admitting: Pulmonary Disease

## 2016-04-20 ENCOUNTER — Encounter: Payer: Self-pay | Admitting: Pulmonary Disease

## 2016-04-20 VITALS — BP 116/76 | HR 93 | Temp 98.3°F | Ht 67.5 in | Wt 179.0 lb

## 2016-04-20 DIAGNOSIS — R5383 Other fatigue: Secondary | ICD-10-CM | POA: Diagnosis not present

## 2016-04-20 DIAGNOSIS — R110 Nausea: Secondary | ICD-10-CM

## 2016-04-20 DIAGNOSIS — R0602 Shortness of breath: Secondary | ICD-10-CM

## 2016-04-20 LAB — CBC WITH DIFFERENTIAL/PLATELET
BASOS ABS: 0 10*3/uL (ref 0.0–0.1)
BASOS PCT: 0.4 % (ref 0.0–3.0)
Eosinophils Absolute: 0 10*3/uL (ref 0.0–0.7)
Eosinophils Relative: 0.3 % (ref 0.0–5.0)
HEMATOCRIT: 46.1 % — AB (ref 36.0–46.0)
Hemoglobin: 15.4 g/dL — ABNORMAL HIGH (ref 12.0–15.0)
LYMPHS ABS: 1.2 10*3/uL (ref 0.7–4.0)
LYMPHS PCT: 18.2 % (ref 12.0–46.0)
MCHC: 33.5 g/dL (ref 30.0–36.0)
MCV: 87.1 fl (ref 78.0–100.0)
MONOS PCT: 9.8 % (ref 3.0–12.0)
Monocytes Absolute: 0.6 10*3/uL (ref 0.1–1.0)
NEUTROS ABS: 4.7 10*3/uL (ref 1.4–7.7)
NEUTROS PCT: 71.3 % (ref 43.0–77.0)
PLATELETS: 176 10*3/uL (ref 150.0–400.0)
RBC: 5.29 Mil/uL — ABNORMAL HIGH (ref 3.87–5.11)
RDW: 14.8 % (ref 11.5–15.5)
WBC: 6.6 10*3/uL (ref 4.0–10.5)

## 2016-04-20 LAB — COMPREHENSIVE METABOLIC PANEL
ALK PHOS: 109 U/L (ref 39–117)
ALT: 13 U/L (ref 0–35)
AST: 22 U/L (ref 0–37)
Albumin: 4.4 g/dL (ref 3.5–5.2)
BILIRUBIN TOTAL: 0.5 mg/dL (ref 0.2–1.2)
BUN: 19 mg/dL (ref 6–23)
CO2: 31 mEq/L (ref 19–32)
CREATININE: 1.24 mg/dL — AB (ref 0.40–1.20)
Calcium: 10.2 mg/dL (ref 8.4–10.5)
Chloride: 97 mEq/L (ref 96–112)
GFR: 43.08 mL/min — ABNORMAL LOW (ref >60.00)
GLUCOSE: 144 mg/dL — AB (ref 70–99)
Potassium: 4.2 mEq/L (ref 3.5–5.1)
SODIUM: 137 meq/L (ref 135–145)
TOTAL PROTEIN: 8.3 g/dL (ref 6.0–8.3)

## 2016-04-20 LAB — URINALYSIS, ROUTINE W REFLEX MICROSCOPIC
Bilirubin Urine: NEGATIVE
HGB URINE DIPSTICK: NEGATIVE
NITRITE: NEGATIVE
RBC / HPF: NONE SEEN (ref 0–?)
SPECIFIC GRAVITY, URINE: 1.01 (ref 1.000–1.030)
TOTAL PROTEIN, URINE-UPE24: NEGATIVE
Urine Glucose: NEGATIVE
Urobilinogen, UA: 0.2 (ref 0.0–1.0)
pH: 8 (ref 5.0–8.0)

## 2016-04-20 MED ORDER — ONDANSETRON HCL 4 MG PO TABS: 4.0000 mg | ORAL_TABLET | Freq: Four times a day (QID) | ORAL | Status: DC | PRN

## 2016-04-20 NOTE — Telephone Encounter (Signed)
Spoke with Geralynn RileShirley Broome (pt's daughter), states that pt is hoarse, shaking/trembling, nauseous, ringing in R ear.  S/s present since dinner yesterday, approx 5:00.  Pt has not yet eaten today, pt's daughter just brought her food.   Denies sinus congestion, fever, chest pain.  Pt did not take any AM meds this morning- not due to difficulty swallowing, but just "because she feels so bad".   Pt's daughter tried to get pt to call EMS to go to hospital but pt declined.   Pt uses [facility].    SN please advise on recs.  Thanks!   Allergies  Allergen Reactions  . Azithromycin Shortness Of Breath  . Ciprofloxacin Other (See Comments)     hallucinations  . Levofloxacin Other (See Comments)    Insomnia, indigestion, tingling sensation in legs  . Furosemide Rash    Bullous pemphigoid  . Latex Rash  . Other Rash    EKG leads caused a rash that required steroids to clear

## 2016-04-20 NOTE — Progress Notes (Signed)
Subjective:    Patient ID: [name], female    DOB: [birth year], 82 y.o.   MRN: [id number]  HPI 82 year old female with history of chronic obstructive asthma/ COPD , bronchitis, hypertension,RBBB, borderline diabetes. Lives in assisted living facility.  C/O 1 days of hoarsess, shaking/trembling, nauseous, ringing in R ear. This is associated with fatigue, inability to take by mouth intake, loss of appetite, nausea. She also has mild increase in dyspnea, wheezing. She however denies any cough, sputum production, fevers, chills. She does not have any vomiting, diarrhea, constipation.  Past Medical History  Diagnosis Date  . Shortness of breath   . Unspecified essential hypertension   . Right bundle branch block   . Other and unspecified hyperlipidemia   . Other abnormal glucose   . Diverticulosis of colon (without mention of hemorrhage)   . Pyelonephritis, unspecified   . Solitary cyst of breast   . Osteoarthrosis, unspecified whether generalized or localized, unspecified site   . Lumbago   . Spinal stenosis, unspecified region other than cervical   . Anemia, unspecified   . GERD (gastroesophageal reflux disease)   . Pyelonephritis   . DJD (degenerative joint disease)   . UTI (lower urinary tract infection)     Current outpatient prescriptions:  .  acetaZOLAMIDE (DIAMOX) 250 MG tablet, TAKE (1) TABLET BY MOUTH EACH MORNING., Disp: 30 tablet, Rfl: 5 .  ADVAIR DISKUS 100-50 MCG/DOSE AEPB, INHALE 1 PUFF BY MOUTH TWICE DAILY. RINSE MOUTH AFTER USE TO PREVENT THRUSH., Disp: 60 each, Rfl: 5 .  ALPRAZolam (XANAX) 0.25 MG tablet, Take 1 tablet (0.25 mg total) by mouth at bedtime as needed for sleep., Disp: 30 tablet, Rfl: 0 .  amLODipine (NORVASC) 5 MG tablet, Take 1 tablet (5 mg total) by mouth daily., Disp: 30 tablet, Rfl: 6 .  aspirin EC 81 MG tablet, Take 81 mg by mouth daily., Disp: , Rfl:  .  cetirizine (ZYRTEC) 10 MG tablet, TAKE 1 TABLET BY MOUTH ONCE DAILY FOR ALLERGIES.,  Disp: 30 tablet, Rfl: 5 .  Cholecalciferol (VITAMIN D3) 2000 UNITS capsule, Take 1 capsule (2,000 Units total) by mouth daily., Disp: 30 capsule, Rfl: 6 .  fluticasone (FLONASE) 50 MCG/ACT nasal spray, SPRAY 2 SPRAYS INTO EACH NOSTRIL ONCE DAILY., Disp: 16 g, Rfl: 5 .  guaifenesin (ROBITUSSIN) 100 MG/5ML syrup, Take 200 mg by mouth 3 (three) times daily as needed for cough., Disp: , Rfl:  .  methimazole (TAPAZOLE) 5 MG tablet, Take 1 tablet (5 mg total) by mouth every other day., Disp: 30 tablet, Rfl: 1 .  Multiple Vitamin (DAILY-VITE) TABS, TAKE ONE TABLET BY MOUTH ONCE DAILY., Disp: 30 tablet, Rfl: 11 .  polyethylene glycol (MIRALAX / GLYCOLAX) packet, Take 17 g by mouth daily as needed for mild constipation. , Disp: , Rfl:  .  potassium chloride (K-DUR) 10 MEQ tablet, Take 2 tablets (20 mEq total) by mouth 2 (two) times daily., Disp: 120 tablet, Rfl: 1 .  PROAIR HFA 108 (90 BASE) MCG/ACT inhaler, INHALE 2 PUFFS EVERY 4 HOURS AS NEEDED, Disp: 8.5 g, Rfl: 0 .  Q-NOL 325 MG tablet, TAKE (1) TABLET BY MOUTH EVERY FOUR HOURS AS NEEDED FOR PAIN., Disp: 30 tablet, Rfl: 5 .  ranitidine (ZANTAC) 150 MG tablet, TAKE ONE TABLET BY MOUTH ONCE DAILY., Disp: 30 tablet, Rfl: 0 .  sertraline (ZOLOFT) 50 MG tablet, TAKE (1) TABLET BY MOUTH AT BEDTIME., Disp: 30 tablet, Rfl: 5 .  Spacer/Aero-Holding Chambers (AEROCHAMBER PLUS WITH MASK)  inhaler, Use as instructed, Disp: 1 each, Rfl: 2 .  torsemide (DEMADEX) 20 MG tablet, TAKE 1/2 TABLET BY MOUTH EVERY MORNING. (Patient taking differently: TAKE 1 TABLET BY MOUTH EVERY MORNING.), Disp: 15 tablet, Rfl: 11  Review of Systems     Objective:   Physical Exam Blood pressure 116/76, pulse 93, temperature 98.3 F (36.8 C), temperature source Oral, height 5' 7.5" (1.715 m), weight 179 lb (81.194 kg), SpO2 89 %. Gen: Mild distress,awake oriented Neuro: No gross focal deficits. HEENT: No JVD, lymphadenopathy, thyromegaly. RS: Occasional wheeze, no crackles.  CVS:  S1-S2 heard, no murmurs rubs gallops. Abdomen: Soft, positive bowel sounds. Musculoskeletal: No edema.    Assessment & Plan:  82 year old with complaints of malaise, fatigue, nausea. I suspect that she may have a case of gastroenteritis or infection with UTI/bronchitis She appears dehydrated with poor PO intake.  I advised that she may need an ER evaluation for IV fluid hydration, possible antibiotics. However she is reluctant to go to the ER. I'll send her for a metabolic panel, CBC, chest, abdominal x-ray and a UA. Based on these results I will call in an antibiotic if there is any evidence of infection. I'll give her Zofran PRN for nausea. Encourage by mouth intake.  I discussed all this with the daughter and told her if she does not improve within a day then she needs to be taken to the emergency room.  Chilton GreathousePraveen Cianna Kasparian MD [facility] Pager [phone] If no answer or after 3pm call: [phone] 04/20/2016, 3:11 PM

## 2016-04-20 NOTE — Telephone Encounter (Signed)
Per SN: We can call in a medrol dosepak for pt, but if she is unable to take meds she needs to be evaluated.  Spoke with pt's daughter, pt scheduled with PM at 2:00 today.  Nothing further needed.

## 2016-04-20 NOTE — Patient Instructions (Addendum)
We'll send you for an abdominal and chest x-ray. You will get labs including a CBC with differential, metabolic panel, UA. We will start given Zofran for nausea. Encourage PO intake.  If symptoms do not improve then you will need to go to the emergency room for IVF and further evaluation.

## 2016-04-22 LAB — URINE CULTURE: Colony Count: >=100000

## 2016-04-23 ENCOUNTER — Inpatient Hospital Stay (HOSPITAL_COMMUNITY): Payer: Medicare Other

## 2016-04-23 ENCOUNTER — Inpatient Hospital Stay (HOSPITAL_COMMUNITY)
Admission: EM | Admit: 2016-04-23 | Discharge: 2016-04-26 | DRG: 189 | Disposition: A | Discharge status: Home Health | Payer: Medicare Other | Attending: Internal Medicine | Admitting: Internal Medicine

## 2016-04-23 ENCOUNTER — Telehealth: Payer: Self-pay | Admitting: Pulmonary Disease

## 2016-04-23 ENCOUNTER — Encounter (HOSPITAL_COMMUNITY): Payer: Self-pay | Admitting: Emergency Medicine

## 2016-04-23 ENCOUNTER — Emergency Department (HOSPITAL_COMMUNITY): Payer: Medicare Other

## 2016-04-23 ENCOUNTER — Other Ambulatory Visit: Payer: Self-pay | Admitting: Acute Care

## 2016-04-23 DIAGNOSIS — Z881 Allergy status to other antibiotic agents status: Secondary | ICD-10-CM | POA: Diagnosis not present

## 2016-04-23 DIAGNOSIS — L8990 Pressure ulcer of unspecified site, unspecified stage: Secondary | ICD-10-CM | POA: Insufficient documentation

## 2016-04-23 DIAGNOSIS — R0902 Hypoxemia: Secondary | ICD-10-CM | POA: Insufficient documentation

## 2016-04-23 DIAGNOSIS — J440 Chronic obstructive pulmonary disease with acute lower respiratory infection: Secondary | ICD-10-CM | POA: Diagnosis present

## 2016-04-23 DIAGNOSIS — J9601 Acute respiratory failure with hypoxia: Secondary | ICD-10-CM | POA: Diagnosis not present

## 2016-04-23 DIAGNOSIS — Z79899 Other long term (current) drug therapy: Secondary | ICD-10-CM | POA: Diagnosis not present

## 2016-04-23 DIAGNOSIS — L8991 Pressure ulcer of unspecified site, stage 1: Secondary | ICD-10-CM | POA: Diagnosis present

## 2016-04-23 DIAGNOSIS — R0602 Shortness of breath: Secondary | ICD-10-CM | POA: Diagnosis present

## 2016-04-23 DIAGNOSIS — I129 Hypertensive chronic kidney disease with stage 1 through stage 4 chronic kidney disease, or unspecified chronic kidney disease: Secondary | ICD-10-CM | POA: Diagnosis present

## 2016-04-23 DIAGNOSIS — K5909 Other constipation: Secondary | ICD-10-CM | POA: Diagnosis not present

## 2016-04-23 DIAGNOSIS — R1312 Dysphagia, oropharyngeal phase: Secondary | ICD-10-CM | POA: Diagnosis present

## 2016-04-23 DIAGNOSIS — D649 Anemia, unspecified: Secondary | ICD-10-CM | POA: Diagnosis present

## 2016-04-23 DIAGNOSIS — J690 Pneumonitis due to inhalation of food and vomit: Secondary | ICD-10-CM | POA: Diagnosis not present

## 2016-04-23 DIAGNOSIS — I872 Venous insufficiency (chronic) (peripheral): Secondary | ICD-10-CM | POA: Diagnosis present

## 2016-04-23 DIAGNOSIS — Z91040 Latex allergy status: Secondary | ICD-10-CM | POA: Diagnosis not present

## 2016-04-23 DIAGNOSIS — J309 Allergic rhinitis, unspecified: Secondary | ICD-10-CM | POA: Diagnosis present

## 2016-04-23 DIAGNOSIS — Y95 Nosocomial condition: Secondary | ICD-10-CM | POA: Diagnosis present

## 2016-04-23 DIAGNOSIS — J42 Unspecified chronic bronchitis: Secondary | ICD-10-CM | POA: Diagnosis not present

## 2016-04-23 DIAGNOSIS — J189 Pneumonia, unspecified organism: Secondary | ICD-10-CM | POA: Diagnosis present

## 2016-04-23 DIAGNOSIS — F419 Anxiety disorder, unspecified: Secondary | ICD-10-CM | POA: Diagnosis present

## 2016-04-23 DIAGNOSIS — E0520 Thyrotoxicosis with toxic multinodular goiter without thyrotoxic crisis or storm: Secondary | ICD-10-CM | POA: Diagnosis present

## 2016-04-23 DIAGNOSIS — M48061 Spinal stenosis, lumbar region without neurogenic claudication: Secondary | ICD-10-CM | POA: Diagnosis present

## 2016-04-23 DIAGNOSIS — E860 Dehydration: Secondary | ICD-10-CM | POA: Diagnosis present

## 2016-04-23 DIAGNOSIS — I10 Essential (primary) hypertension: Secondary | ICD-10-CM | POA: Diagnosis not present

## 2016-04-23 DIAGNOSIS — J9620 Acute and chronic respiratory failure, unspecified whether with hypoxia or hypercapnia: Secondary | ICD-10-CM | POA: Diagnosis not present

## 2016-04-23 DIAGNOSIS — R059 Cough, unspecified: Secondary | ICD-10-CM

## 2016-04-23 DIAGNOSIS — R05 Cough: Secondary | ICD-10-CM | POA: Diagnosis present

## 2016-04-23 DIAGNOSIS — R350 Frequency of micturition: Secondary | ICD-10-CM | POA: Diagnosis present

## 2016-04-23 DIAGNOSIS — Z7982 Long term (current) use of aspirin: Secondary | ICD-10-CM | POA: Diagnosis not present

## 2016-04-23 DIAGNOSIS — N183 Chronic kidney disease, stage 3 unspecified: Secondary | ICD-10-CM | POA: Diagnosis present

## 2016-04-23 DIAGNOSIS — I4510 Unspecified right bundle-branch block: Secondary | ICD-10-CM | POA: Diagnosis present

## 2016-04-23 DIAGNOSIS — E039 Hypothyroidism, unspecified: Secondary | ICD-10-CM | POA: Diagnosis present

## 2016-04-23 DIAGNOSIS — Z8249 Family history of ischemic heart disease and other diseases of the circulatory system: Secondary | ICD-10-CM | POA: Diagnosis not present

## 2016-04-23 DIAGNOSIS — M545 Low back pain, unspecified: Secondary | ICD-10-CM | POA: Diagnosis present

## 2016-04-23 DIAGNOSIS — R51 Headache: Secondary | ICD-10-CM

## 2016-04-23 DIAGNOSIS — K5900 Constipation, unspecified: Secondary | ICD-10-CM | POA: Diagnosis present

## 2016-04-23 DIAGNOSIS — E0590 Thyrotoxicosis, unspecified without thyrotoxic crisis or storm: Secondary | ICD-10-CM | POA: Diagnosis present

## 2016-04-23 DIAGNOSIS — K224 Dyskinesia of esophagus: Secondary | ICD-10-CM | POA: Diagnosis present

## 2016-04-23 DIAGNOSIS — K219 Gastro-esophageal reflux disease without esophagitis: Secondary | ICD-10-CM | POA: Diagnosis present

## 2016-04-23 DIAGNOSIS — E785 Hyperlipidemia, unspecified: Secondary | ICD-10-CM | POA: Diagnosis present

## 2016-04-23 DIAGNOSIS — M4806 Spinal stenosis, lumbar region: Secondary | ICD-10-CM | POA: Diagnosis present

## 2016-04-23 DIAGNOSIS — R7303 Prediabetes: Secondary | ICD-10-CM | POA: Diagnosis present

## 2016-04-23 DIAGNOSIS — J441 Chronic obstructive pulmonary disease with (acute) exacerbation: Secondary | ICD-10-CM | POA: Diagnosis present

## 2016-04-23 DIAGNOSIS — J449 Chronic obstructive pulmonary disease, unspecified: Secondary | ICD-10-CM | POA: Diagnosis present

## 2016-04-23 DIAGNOSIS — J9811 Atelectasis: Secondary | ICD-10-CM | POA: Diagnosis present

## 2016-04-23 DIAGNOSIS — Z96641 Presence of right artificial hip joint: Secondary | ICD-10-CM | POA: Diagnosis present

## 2016-04-23 DIAGNOSIS — R0982 Postnasal drip: Secondary | ICD-10-CM | POA: Diagnosis not present

## 2016-04-23 DIAGNOSIS — R519 Headache, unspecified: Secondary | ICD-10-CM

## 2016-04-23 DIAGNOSIS — R7301 Impaired fasting glucose: Secondary | ICD-10-CM | POA: Diagnosis present

## 2016-04-23 LAB — BLOOD GAS, ARTERIAL
ACID-BASE EXCESS: 4 mmol/L — AB (ref 0.0–2.0)
Bicarbonate: 31 mEq/L — ABNORMAL HIGH (ref 20.0–24.0)
Drawn by: 257701
O2 Content: 3.5 L/min
O2 Saturation: 90.7 %
PATIENT TEMPERATURE: 98.6
PCO2 ART: 59.8 mmHg — AB (ref 35.0–45.0)
PH ART: 7.335 — AB (ref 7.350–7.450)
PO2 ART: 64.9 mmHg — AB (ref 80.0–100.0)
TCO2: 27.9 mmol/L (ref 0–100)

## 2016-04-23 LAB — BASIC METABOLIC PANEL
Anion gap: 8 (ref 5–15)
BUN: 27 mg/dL — ABNORMAL HIGH (ref 6–20)
CALCIUM: 9.2 mg/dL (ref 8.9–10.3)
CO2: 30 mmol/L (ref 22–32)
CREATININE: 1.2 mg/dL — AB (ref 0.44–1.00)
Chloride: 97 mmol/L — ABNORMAL LOW (ref 101–111)
GFR calc non Af Amer: 38 mL/min — ABNORMAL LOW (ref >60)
GFR, EST AFRICAN AMERICAN: 44 mL/min — AB (ref >60)
Glucose, Bld: 99 mg/dL (ref 65–99)
Potassium: 4.3 mmol/L (ref 3.5–5.1)
SODIUM: 135 mmol/L (ref 135–145)

## 2016-04-23 LAB — CBC WITH DIFFERENTIAL/PLATELET
Basophils Absolute: 0 10*3/uL (ref 0.0–0.1)
Basophils Relative: 1 %
EOS PCT: 3 %
Eosinophils Absolute: 0.1 10*3/uL (ref 0.0–0.7)
HCT: 44.8 % (ref 36.0–46.0)
Hemoglobin: 14.7 g/dL (ref 12.0–15.0)
LYMPHS ABS: 1.2 10*3/uL (ref 0.7–4.0)
Lymphocytes Relative: 32 %
MCH: 29.5 pg (ref 26.0–34.0)
MCHC: 32.8 g/dL (ref 30.0–36.0)
MCV: 90 fL (ref 78.0–100.0)
MONO ABS: 0.4 10*3/uL (ref 0.1–1.0)
Monocytes Relative: 10 %
Neutro Abs: 2.1 10*3/uL (ref 1.7–7.7)
Neutrophils Relative %: 54 %
PLATELETS: ADEQUATE 10*3/uL (ref 150–400)
RBC: 4.98 MIL/uL (ref 3.87–5.11)
RDW: 13.9 % (ref 11.5–15.5)
Smear Review: ADEQUATE
WBC: 3.8 10*3/uL — AB (ref 4.0–10.5)

## 2016-04-23 LAB — TROPONIN I: Troponin I: <0.03 ng/mL (ref <0.031)

## 2016-04-23 LAB — GLUCOSE, CAPILLARY: GLUCOSE-CAPILLARY: 267 mg/dL — AB (ref 65–99)

## 2016-04-23 LAB — MAGNESIUM: MAGNESIUM: 2.3 mg/dL (ref 1.7–2.4)

## 2016-04-23 LAB — URINALYSIS, ROUTINE W REFLEX MICROSCOPIC
BILIRUBIN URINE: NEGATIVE
Glucose, UA: NEGATIVE mg/dL
Hgb urine dipstick: NEGATIVE
Ketones, ur: NEGATIVE mg/dL
LEUKOCYTES UA: NEGATIVE
Nitrite: NEGATIVE
PROTEIN: NEGATIVE mg/dL
SPECIFIC GRAVITY, URINE: 1.006 (ref 1.005–1.030)
pH: 6 (ref 5.0–8.0)

## 2016-04-23 LAB — MRSA PCR SCREENING: MRSA BY PCR: NEGATIVE

## 2016-04-23 LAB — BRAIN NATRIURETIC PEPTIDE: B NATRIURETIC PEPTIDE 5: 34.7 pg/mL (ref 0.0–100.0)

## 2016-04-23 MED ORDER — GUAIFENESIN 100 MG/5ML PO SOLN: 200.0000 mg | Freq: Three times a day (TID) | ORAL | Status: DC | PRN

## 2016-04-23 MED ORDER — ALPRAZOLAM 0.25 MG PO TABS
0.2500 mg | ORAL_TABLET | Freq: Two times a day (BID) | ORAL | Status: DC | PRN
  Administered 2016-04-24 – 2016-04-25 (×2): 0.25 mg via ORAL
  Filled 2016-04-23 (×2): qty 1

## 2016-04-23 MED ORDER — IPRATROPIUM BROMIDE 0.02 % IN SOLN
0.5000 mg | RESPIRATORY_TRACT | Status: DC | PRN
  Filled 2016-04-23: qty 2.5

## 2016-04-23 MED ORDER — POTASSIUM CHLORIDE ER 10 MEQ PO TBCR
20.0000 meq | EXTENDED_RELEASE_TABLET | Freq: Two times a day (BID) | ORAL | Status: DC
  Administered 2016-04-23 – 2016-04-26 (×6): 20 meq via ORAL
  Filled 2016-04-23 (×12): qty 2

## 2016-04-23 MED ORDER — LEVALBUTEROL HCL 0.63 MG/3ML IN NEBU
0.6300 mg | INHALATION_SOLUTION | Freq: Four times a day (QID) | RESPIRATORY_TRACT | Status: DC
  Administered 2016-04-24 – 2016-04-26 (×10): 0.63 mg via RESPIRATORY_TRACT
  Filled 2016-04-23 (×10): qty 3

## 2016-04-23 MED ORDER — FAMOTIDINE 40 MG/5ML PO SUSR: 20.0000 mg | Freq: Every day | ORAL | Status: DC

## 2016-04-23 MED ORDER — BUDESONIDE 0.25 MG/2ML IN SUSP
0.2500 mg | Freq: Two times a day (BID) | RESPIRATORY_TRACT | Status: DC
  Administered 2016-04-23 – 2016-04-26 (×6): 0.25 mg via RESPIRATORY_TRACT
  Filled 2016-04-23 (×6): qty 2

## 2016-04-23 MED ORDER — SENNOSIDES-DOCUSATE SODIUM 8.6-50 MG PO TABS: 1.0000 | ORAL_TABLET | Freq: Every evening | ORAL | Status: DC | PRN

## 2016-04-23 MED ORDER — FLUTICASONE PROPIONATE 50 MCG/ACT NA SUSP
2.0000 | Freq: Every day | NASAL | Status: DC
  Administered 2016-04-24 – 2016-04-26 (×3): 2 via NASAL
  Filled 2016-04-23: qty 16

## 2016-04-23 MED ORDER — IPRATROPIUM-ALBUTEROL 0.5-2.5 (3) MG/3ML IN SOLN
3.0000 mL | Freq: Once | RESPIRATORY_TRACT | Status: AC
  Administered 2016-04-23: 3 mL via RESPIRATORY_TRACT
  Filled 2016-04-23: qty 3

## 2016-04-23 MED ORDER — AMLODIPINE BESYLATE 5 MG PO TABS
5.0000 mg | ORAL_TABLET | Freq: Every day | ORAL | Status: DC
  Administered 2016-04-24 – 2016-04-26 (×3): 5 mg via ORAL
  Filled 2016-04-23 (×3): qty 1

## 2016-04-23 MED ORDER — ACETAMINOPHEN 650 MG RE SUPP: 650.0000 mg | Freq: Four times a day (QID) | RECTAL | Status: DC | PRN

## 2016-04-23 MED ORDER — VANCOMYCIN HCL IN DEXTROSE 1-5 GM/200ML-% IV SOLN
1000.0000 mg | INTRAVENOUS | Status: DC
Start: 2016-04-23 — End: 2016-04-24
  Administered 2016-04-23: 1000 mg via INTRAVENOUS
  Filled 2016-04-23: qty 200

## 2016-04-23 MED ORDER — SODIUM CHLORIDE 0.9% FLUSH
3.0000 mL | Freq: Two times a day (BID) | INTRAVENOUS | Status: DC
  Administered 2016-04-23 – 2016-04-26 (×4): 3 mL via INTRAVENOUS

## 2016-04-23 MED ORDER — METHYLPREDNISOLONE SODIUM SUCC 125 MG IJ SOLR
80.0000 mg | Freq: Once | INTRAMUSCULAR | Status: AC
  Administered 2016-04-23: 80 mg via INTRAVENOUS
  Filled 2016-04-23: qty 2

## 2016-04-23 MED ORDER — ACETAMINOPHEN 325 MG PO TABS
650.0000 mg | ORAL_TABLET | Freq: Four times a day (QID) | ORAL | Status: DC | PRN
Start: 2016-04-23 — End: 2016-04-26

## 2016-04-23 MED ORDER — IPRATROPIUM BROMIDE 0.02 % IN SOLN
0.5000 mg | Freq: Four times a day (QID) | RESPIRATORY_TRACT | Status: DC
  Administered 2016-04-23: 0.5 mg via RESPIRATORY_TRACT
  Filled 2016-04-23: qty 2.5

## 2016-04-23 MED ORDER — ASPIRIN EC 81 MG PO TBEC
81.0000 mg | DELAYED_RELEASE_TABLET | Freq: Every day | ORAL | Status: DC
  Administered 2016-04-24 – 2016-04-26 (×3): 81 mg via ORAL
  Filled 2016-04-23 (×3): qty 1

## 2016-04-23 MED ORDER — IPRATROPIUM BROMIDE 0.02 % IN SOLN
0.5000 mg | Freq: Four times a day (QID) | RESPIRATORY_TRACT | Status: DC
  Administered 2016-04-24 – 2016-04-26 (×10): 0.5 mg via RESPIRATORY_TRACT
  Filled 2016-04-23 (×10): qty 2.5

## 2016-04-23 MED ORDER — METHIMAZOLE 5 MG PO TABS
5.0000 mg | ORAL_TABLET | ORAL | Status: DC
  Administered 2016-04-25: 5 mg via ORAL
  Filled 2016-04-23: qty 1

## 2016-04-23 MED ORDER — METHYLPREDNISOLONE SODIUM SUCC 125 MG IJ SOLR
60.0000 mg | Freq: Three times a day (TID) | INTRAMUSCULAR | Status: DC
  Administered 2016-04-24 (×3): 60 mg via INTRAVENOUS
  Filled 2016-04-23 (×3): qty 2

## 2016-04-23 MED ORDER — VITAMIN D 1000 UNITS PO TABS
2000.0000 [IU] | ORAL_TABLET | Freq: Every day | ORAL | Status: DC
  Administered 2016-04-24 – 2016-04-26 (×3): 2000 [IU] via ORAL
  Filled 2016-04-23 (×3): qty 2

## 2016-04-23 MED ORDER — ONDANSETRON HCL 4 MG PO TABS
4.0000 mg | ORAL_TABLET | Freq: Four times a day (QID) | ORAL | Status: DC | PRN
Start: 2016-04-23 — End: 2016-04-26

## 2016-04-23 MED ORDER — INSULIN ASPART 100 UNIT/ML ~~LOC~~ SOLN
0.0000 [IU] | Freq: Three times a day (TID) | SUBCUTANEOUS | Status: DC
  Administered 2016-04-24 (×2): 3 [IU] via SUBCUTANEOUS
  Administered 2016-04-24 – 2016-04-25 (×2): 2 [IU] via SUBCUTANEOUS
  Administered 2016-04-25: 3 [IU] via SUBCUTANEOUS
  Administered 2016-04-26: 8 [IU] via SUBCUTANEOUS

## 2016-04-23 MED ORDER — LEVALBUTEROL HCL 0.63 MG/3ML IN NEBU
0.6300 mg | INHALATION_SOLUTION | Freq: Four times a day (QID) | RESPIRATORY_TRACT | Status: DC
  Administered 2016-04-23: 0.63 mg via RESPIRATORY_TRACT
  Filled 2016-04-23: qty 3

## 2016-04-23 MED ORDER — RANITIDINE HCL 150 MG/10ML PO SYRP
150.0000 mg | ORAL_SOLUTION | Freq: Every day | ORAL | Status: DC
  Administered 2016-04-23 – 2016-04-25 (×3): 150 mg via ORAL
  Filled 2016-04-23 (×4): qty 10

## 2016-04-23 MED ORDER — SODIUM CHLORIDE 0.9 % IV SOLN
INTRAVENOUS | Status: DC
  Administered 2016-04-23 – 2016-04-26 (×5): via INTRAVENOUS

## 2016-04-23 MED ORDER — LEVALBUTEROL HCL 0.63 MG/3ML IN NEBU
0.6300 mg | INHALATION_SOLUTION | RESPIRATORY_TRACT | Status: DC | PRN
  Filled 2016-04-23: qty 3

## 2016-04-23 MED ORDER — ADULT MULTIVITAMIN W/MINERALS CH
1.0000 | ORAL_TABLET | Freq: Every day | ORAL | Status: DC
  Administered 2016-04-25 – 2016-04-26 (×2): 1 via ORAL
  Filled 2016-04-23 (×4): qty 1

## 2016-04-23 MED ORDER — HEPARIN SODIUM (PORCINE) 5000 UNIT/ML IJ SOLN
5000.0000 [IU] | Freq: Three times a day (TID) | INTRAMUSCULAR | Status: DC
  Administered 2016-04-23 – 2016-04-26 (×8): 5000 [IU] via SUBCUTANEOUS
  Filled 2016-04-23 (×9): qty 1

## 2016-04-23 MED ORDER — ONDANSETRON HCL 4 MG/2ML IJ SOLN: 4.0000 mg | Freq: Four times a day (QID) | INTRAMUSCULAR | Status: DC | PRN

## 2016-04-23 MED ORDER — CEFEPIME HCL 2 G IJ SOLR
2.0000 g | INTRAMUSCULAR | Status: AC
  Administered 2016-04-23: 2 g via INTRAVENOUS
  Filled 2016-04-23: qty 2

## 2016-04-23 MED ORDER — ACETAZOLAMIDE 250 MG PO TABS
250.0000 mg | ORAL_TABLET | Freq: Every day | ORAL | Status: DC
  Administered 2016-04-24 – 2016-04-26 (×3): 250 mg via ORAL
  Filled 2016-04-23 (×3): qty 1

## 2016-04-23 MED ORDER — FLEET ENEMA 7-19 GM/118ML RE ENEM: 1.0000 | ENEMA | Freq: Once | RECTAL | Status: DC | PRN

## 2016-04-23 MED ORDER — SERTRALINE HCL 50 MG PO TABS
50.0000 mg | ORAL_TABLET | Freq: Every day | ORAL | Status: DC
  Administered 2016-04-23 – 2016-04-25 (×3): 50 mg via ORAL
  Filled 2016-04-23 (×3): qty 1

## 2016-04-23 MED ORDER — DEXTROSE 5 % IV SOLN
1.0000 g | Freq: Two times a day (BID) | INTRAVENOUS | Status: DC
  Administered 2016-04-24 – 2016-04-25 (×3): 1 g via INTRAVENOUS
  Filled 2016-04-23 (×3): qty 1

## 2016-04-23 MED ORDER — SORBITOL 70 % SOLN
30.0000 mL | Freq: Every day | Status: DC | PRN
  Filled 2016-04-23: qty 30

## 2016-04-23 MED ORDER — OXYCODONE HCL 5 MG PO TABS
5.0000 mg | ORAL_TABLET | ORAL | Status: DC | PRN
  Administered 2016-04-24 – 2016-04-25 (×2): 5 mg via ORAL
  Filled 2016-04-23 (×2): qty 1

## 2016-04-23 NOTE — Progress Notes (Signed)
Pharmacy Antibiotic Note  [name] is a 82 y.o. female admitted on 04/23/2016 with HCAP + AECOPD.  Pharmacy has been consulted for vancomycin dosing; renally adjust cefepime.  Plan:  Vancomycin 1000 mg IV q24 hr; goal trough 15-20 mcg/mL  Measure vancomycin trough levels at steady state as indicated  Cefepime 2g IV ordered x1; will continue with 1g IV q12 hr for renal function  Follow clinical course, renal function, culture results as available  Follow for de-escalation of antibiotics and LOT     Temp (24hrs), Avg:97.4 F (36.3 C), Min:97.4 F (36.3 C), Max:97.4 F (36.3 C)   Recent Labs Lab 04/20/16 1513 04/23/16 1403  WBC 6.6 3.8*  CREATININE 1.24* 1.20*    Estimated Creatinine Clearance: 33.8 mL/min (by C-G formula based on Cr of 1.2).    Allergies  Allergen Reactions  . Azithromycin Shortness Of Breath  . Ciprofloxacin Other (See Comments)     hallucinations  . Levofloxacin Other (See Comments)    Insomnia, indigestion, tingling sensation in legs  . Furosemide Rash    Bullous pemphigoid  . Latex Rash  . Other Rash    EKG leads caused a rash that required steroids to clear    Antimicrobials this admission: 6/5 vancomycin >>  6/5 cefepime >>   Dose adjustments this admission: ---  Microbiology results: 6/5 BCx: sent 6/5 UCx: sent  6/5 Sputum: sent   Thank you for allowing pharmacy to be a part of this patient's care.  Bernadene Personrew Aariz Maish, PharmD, BCPS Pager: [phone] 04/23/2016, 5:34 PM

## 2016-04-23 NOTE — Telephone Encounter (Signed)
Opened in error

## 2016-04-23 NOTE — Telephone Encounter (Signed)
Please call the patient's daughter to check how [name] is doing. Let her know that the labs, X rays are OK. I don't see a clear sign of urinary tract infection. If she is still feeling poorly then she may need be revaluated.

## 2016-04-23 NOTE — Progress Notes (Signed)
Utilization Review completed.  Delmore Sear RN CM  

## 2016-04-23 NOTE — ED Notes (Signed)
Patient transported to CT 

## 2016-04-23 NOTE — ED Notes (Signed)
Unable to collect labs at this time patient is not in the room 

## 2016-04-23 NOTE — ED Notes (Signed)
PT CAN GO TO FLOOR AT 16:50.

## 2016-04-23 NOTE — H&P (Signed)
History and Physical    [name] ZOX:[id number] DOB: March 12, 1923 DOA: 04/23/2016  PCP: Michele McalpineNADEL,SCOTT M, MD  Patient coming from: [facility]  Chief Complaint: Shortness of breath  HPI: [name] is a 82 y.o. female with medical history significant of hyperthyroidism, hyperlipidemia, hypertension, spinal stenosis, gastroesophageal reflux disease, COPD, anxiety who presents to the ED with a three-day history of worsening shortness of breath, wheezing, productive cough, subjective fevers, nausea, dysuria, generalized fatigue, headache, ringing in the ears. Patient also endorses some loose stools after being started on laxatives for constipation. Patient denies any chills, no emesis, no chest pain, no melena, no hematemesis, no hematochezia. Patient doesn't on some constipation and was on laxatives for it. Patient also endorses some abdominal pain diffusely. Patient denies any visual changes. No slurred speech. No asymmetric weakness or numbness.  ED Course: Patient was in the ED they sig metabolic profile obtained and a BUN of 27 creatinine 1.20 otherwise was within normal limits. BNP was 34.7. Point-of-care troponin was less than 0.03. EKG showed a right bundle branch block which was unchanged. CBC had a white count of 3.8 otherwise was within normal limits. Chest x-ray was unremarkable. Patient noted to have a sat of 75% on initial presentation was placed on nasal cannula with sats increasing to the low 90s. Patient was given some nebulizer treatments. [facility] were called to admit the patient for further evaluation and management.  Review of Systems: As per HPI otherwise 10 point review of systems negative.   Past Medical History  Diagnosis Date  . Shortness of breath   . Unspecified essential hypertension   . Right bundle branch block   . Other and unspecified hyperlipidemia   . Other abnormal glucose   . Diverticulosis of colon (without mention of  hemorrhage)   . Pyelonephritis, unspecified   . Solitary cyst of breast   . Osteoarthrosis, unspecified whether generalized or localized, unspecified site   . Lumbago   . Spinal stenosis, unspecified region other than cervical   . Anemia, unspecified   . GERD (gastroesophageal reflux disease)   . Pyelonephritis   . DJD (degenerative joint disease)   . UTI (lower urinary tract infection)     Past Surgical History  Procedure Laterality Date  . Cataract extraction    . Right hip hemiarthroplasty      total  . Conversion to right thr       reports that she has never smoked. She has never used smokeless tobacco. She reports that she does not drink alcohol or use illicit drugs.  Allergies  Allergen Reactions  . Azithromycin Shortness Of Breath  . Ciprofloxacin Other (See Comments)     hallucinations  . Levofloxacin Other (See Comments)    Insomnia, indigestion, tingling sensation in legs  . Furosemide Rash    Bullous pemphigoid  . Latex Rash  . Other Rash    EKG leads caused a rash that required steroids to clear    Family History  Problem Relation Age of Onset  . Cancer Brother   . Cancer Brother   . Heart failure Father    Family history reviewed and not pertinent.  Prior to Admission medications   Medication Sig Start Date End Date Taking? Authorizing Provider  acetaZOLAMIDE (DIAMOX) 250 MG tablet TAKE (1) TABLET BY MOUTH EACH MORNING. Patient taking differently: TAKE 250 MG BY MOUTH EACH MORNING. 01/17/16  Yes Michele McalpineScott M Nadel, MD  ADVAIR DISKUS 100-50 MCG/DOSE AEPB INHALE 1  PUFF BY MOUTH TWICE DAILY. RINSE MOUTH AFTER USE TO PREVENT THRUSH. 04/03/16  Yes Michele Mcalpine, MD  ALPRAZolam Prudy Feeler) 0.25 MG tablet Take 1 tablet (0.25 mg total) by mouth at bedtime as needed for sleep. Patient taking differently: Take 0.25 mg by mouth 2 (two) times daily.  03/26/16  Yes Michele Mcalpine, MD  amLODipine (NORVASC) 5 MG tablet Take 1 tablet (5 mg total) by mouth daily. 03/23/16  Yes Michele Mcalpine, MD  aspirin EC 81 MG tablet Take 81 mg by mouth daily.   Yes Historical Provider, MD  cetirizine (ZYRTEC) 10 MG tablet TAKE 1 TABLET BY MOUTH ONCE DAILY FOR ALLERGIES. Patient taking differently: TAKE 10 MG BY MOUTH ONCE DAILY FOR ALLERGIES. 03/01/16  Yes Michele Mcalpine, MD  Cholecalciferol (VITAMIN D3) 2000 UNITS capsule Take 1 capsule (2,000 Units total) by mouth daily. 08/16/15  Yes Michele Mcalpine, MD  fluticasone (FLONASE) 50 MCG/ACT nasal spray SPRAY 2 SPRAYS INTO EACH NOSTRIL ONCE DAILY. 03/16/16  Yes Bevelyn Ngo, NP  guaifenesin (ROBITUSSIN) 100 MG/5ML syrup Take 200 mg by mouth 3 (three) times daily as needed for cough.   Yes Historical Provider, MD  methimazole (TAPAZOLE) 5 MG tablet Take 1 tablet (5 mg total) by mouth every other day. 03/01/16  Yes Carlus Pavlov, MD  Multiple Vitamin (DAILY-VITE) TABS TAKE ONE TABLET BY MOUTH ONCE DAILY. 04/09/16  Yes Michele Mcalpine, MD  ondansetron (ZOFRAN) 4 MG tablet Take 1 tablet (4 mg total) by mouth every 6 (six) hours as needed for nausea or vomiting. 04/20/16  Yes Praveen Mannam, MD  polyethylene glycol (MIRALAX / GLYCOLAX) packet Take 17 g by mouth daily as needed for mild constipation.    Yes Historical Provider, MD  potassium chloride (K-DUR) 10 MEQ tablet Take 2 tablets (20 mEq total) by mouth 2 (two) times daily. 03/15/16  Yes Michele Mcalpine, MD  PROAIR HFA 108 (90 BASE) MCG/ACT inhaler INHALE 2 PUFFS EVERY 4 HOURS AS NEEDED Patient taking differently: INHALE 2 PUFFS EVERY 4 HOURS AS NEEDED FOR SOB, WHEEZING 06/13/15  Yes Michele Mcalpine, MD  Q-NOL 325 MG tablet TAKE (1) TABLET BY MOUTH EVERY FOUR HOURS AS NEEDED FOR PAIN. 04/13/16  Yes Michele Mcalpine, MD  ranitidine (ZANTAC) 150 MG tablet TAKE ONE TABLET BY MOUTH ONCE DAILY. Patient taking differently: TAKE 150 MG BY MOUTH ONCE DAILY. 03/30/16  Yes Michele Mcalpine, MD  sertraline (ZOLOFT) 50 MG tablet TAKE (1) TABLET BY MOUTH AT BEDTIME. Patient taking differently: Take 50 mg by mouth at bedtime.   03/23/16  Yes Michele Mcalpine, MD  Spacer/Aero-Holding Chambers (AEROCHAMBER PLUS WITH MASK) inhaler Use as instructed 06/12/15  Yes Arby Barrette, MD  torsemide (DEMADEX) 20 MG tablet TAKE 1/2 TABLET BY MOUTH EVERY MORNING. Patient taking differently: TAKE 20 MG BY MOUTH EVERY MORNING. 01/24/16  Yes Michele Mcalpine, MD    Physical Exam: Filed Vitals:   04/23/16 1322 04/23/16 1323 04/23/16 1328 04/23/16 1556  BP: 161/74   121/67  Pulse: 81   74  Temp: 97.4 F (36.3 C)     TempSrc: Oral     Resp: 18   16  SpO2: 75% 93% 89% 94%      Constitutional: Elderly female lying on gurney no acute cardiopulmonary distress.  Filed Vitals:   04/23/16 1322 04/23/16 1323 04/23/16 1328 04/23/16 1556  BP: 161/74   121/67  Pulse: 81   74  Temp: 97.4 F (36.3 C)  TempSrc: Oral     Resp: 18   16  SpO2: 75% 93% 89% 94%   Eyes: PERRLA, EOMI, lids and conjunctivae normal ENMT: Mucous membranes are extremely dry. Posterior pharynx clear of any exudate or lesions.Normal dentition.  Neck: normal, supple, no masses, no thyromegaly Respiratory: Some coarse rhonchorous breath sounds in the bases right greater than left, mild expiratory wheezing, no crackles. Normal respiratory effort. No accessory muscle use.  Cardiovascular: Regular rate and rhythm, no murmurs / rubs / gallops. No extremity edema. 2+ pedal pulses. No carotid bruits.  Abdomen: no tenderness, no masses palpated. No hepatosplenomegaly. Bowel sounds positive.  Musculoskeletal: no clubbing / cyanosis. No joint deformity upper and lower extremities. Good ROM, no contractures. Normal muscle tone.  Skin: no rashes, lesions, ulcers. No induration Neurologic: CN 2-12 grossly intact. Sensation intact, DTR normal. Strength 5/5 in all 4.  Psychiatric: Normal judgment and insight. Alert and oriented x 3. Normal mood.   Labs on Admission: I have personally reviewed following labs and imaging studies  CBC:  Recent Labs Lab 04/20/16 1513 04/23/16 1403   WBC 6.6 3.8*  NEUTROABS 4.7 2.1  HGB 15.4* 14.7  HCT 46.1* 44.8  MCV 87.1 90.0  PLT 176.0 PLATELET CLUMPS NOTED ON SMEAR, COUNT APPEARS ADEQUATE   Basic Metabolic Panel:  Recent Labs Lab 04/20/16 1513 04/23/16 1403  NA 137 135  K 4.2 4.3  CL 97 97*  CO2 31 30  GLUCOSE 144* 99  BUN 19 27*  CREATININE 1.24* 1.20*  CALCIUM 10.2 9.2   GFR: Estimated Creatinine Clearance: 33.8 mL/min (by C-G formula based on Cr of 1.2). Liver Function Tests:  Recent Labs Lab 04/20/16 1513  AST 22  ALT 13  ALKPHOS 109  BILITOT 0.5  PROT 8.3  ALBUMIN 4.4   No results for input(s): LIPASE, AMYLASE in the last 168 hours. No results for input(s): AMMONIA in the last 168 hours. Coagulation Profile: No results for input(s): INR, PROTIME in the last 168 hours. Cardiac Enzymes:  Recent Labs Lab 04/23/16 1403  TROPONINI <0.03   BNP (last 3 results)  Recent Labs  08/09/15 1047  PROBNP 72.0   HbA1C: No results for input(s): HGBA1C in the last 72 hours. CBG: No results for input(s): GLUCAP in the last 168 hours. Lipid Profile: No results for input(s): CHOL, HDL, LDLCALC, TRIG, CHOLHDL, LDLDIRECT in the last 72 hours. Thyroid Function Tests: No results for input(s): TSH, T4TOTAL, FREET4, T3FREE, THYROIDAB in the last 72 hours. Anemia Panel: No results for input(s): VITAMINB12, FOLATE, FERRITIN, TIBC, IRON, RETICCTPCT in the last 72 hours. Urine analysis:    Component Value Date/Time   COLORURINE YELLOW 04/23/2016 1603   APPEARANCEUR CLEAR 04/23/2016 1603   LABSPEC 1.006 04/23/2016 1603   PHURINE 6.0 04/23/2016 1603   GLUCOSEU NEGATIVE 04/23/2016 1603   GLUCOSEU NEGATIVE 04/20/2016 1513   HGBUR NEGATIVE 04/23/2016 1603   BILIRUBINUR NEGATIVE 04/23/2016 1603   KETONESUR NEGATIVE 04/23/2016 1603   PROTEINUR NEGATIVE 04/23/2016 1603   UROBILINOGEN 0.2 04/20/2016 1513   NITRITE NEGATIVE 04/23/2016 1603   LEUKOCYTESUR NEGATIVE 04/23/2016 1603   Sepsis Labs:  !!!!!!!!!!!!!!!!!!!!!!!!!!!!!!!!!!!!!!!!!!!! @LABRCNTIP (procalcitonin:4,lacticidven:4) ) Recent Results (from the past 240 hour(s))  Urine culture     Status: None   Collection Time: 04/20/16  3:13 PM  Result Value Ref Range Status   Colony Count >=100,000 COLONIES/ML  Final   Organism ID, Bacteria Multiple bacterial morphotypes present, none  Final   Organism ID, Bacteria predominant. Suggest appropriate recollection if   Final  Organism ID, Bacteria clinically indicated.  Final     Radiological Exams on Admission: Dg Chest 2 View  04/23/2016  CLINICAL DATA:  Increasing shortness of breath for 3 days. EXAM: CHEST  2 VIEW COMPARISON:  04/20/2016 FINDINGS: Stable elevation of the right hemidiaphragm. No confluent opacities or effusions. Heart is normal size. No acute bony abnormality. IMPRESSION: Elevated right hemidiaphragm, stable.  No active disease. Electronically Signed   By: Charlett Nose M.D.   On: 04/23/2016 13:52    EKG: Independently reviewed. Right bundle branch block unchanged from prior EKG.  Assessment/Plan Principal Problem:   Acute respiratory failure with hypoxia (HCC) Active Problems:   ANEMIA   Essential hypertension   Constipation   Spinal stenosis of lumbar region   LOW BACK PAIN SYNDROME   DYSPNEA   DIABETES MELLITUS, BORDERLINE   Anxiety   GERD (gastroesophageal reflux disease)   Chronic venous insufficiency   COPD (chronic obstructive pulmonary disease) (HCC)   Thyrotoxicosis   Allergic rhinitis   HCAP (healthcare-associated pneumonia): Probable   COPD exacerbation (HCC)   Dehydration   CKD (chronic kidney disease), stage III    #1 acute respiratory failure with hypoxia secondary to probable healthcare associated pneumonia and acute COPD exacerbation Patient presented with acute respiratory failure with hypoxia with shortness of breath noted to have sats of 75%. Patient endorses a productive cough, worsening shortness of breath and wheezing with  associated nausea and fatigue and generalized weakness. Clinical exam worrisome for a healthcare associated pneumonia in the setting of possible acute COPD exacerbation and significant dehydration. Will admit the patient to telemetry. Check a sputum Gram stain and culture. Check a urine Legionella antigen. Check a urine pneumococcus antigen. Will get a CT of the chest without contrast. Will check blood cultures 2. Will check ABG. Place patient empirically on oxygen, scheduled nebulizers, Pulmicort, Brovana, IV steroid taper, empiric IV cefepime and IV vancomycin. Follow.  #2 acute COPD exacerbation Likely triggered by probable healthcare associated pneumonia. Patient noted to have some mild wheezing on examination and presented with worsening shortness of breath and productive cough. Will place patient on oxygen, scheduled nebulizers, Pulmicort, Brovana, IV steroids, H2 blocker, Flonase. Follow.  #3 probable healthcare associated pneumonia Patient presented from assisted living facility with worsening shortness of breath, hypoxia, productive cough, some wheezing, fatigue, generalized weakness. Will admit the patient to telemetry. Check a sputum Gram stain and culture. Check a urine Legionella antigen. Check a urine pneumococcus antigen, check blood cultures. Will get a CT chest for further evaluation as patient with significant hypoxia. Check ABG. Place on oxygen, scheduled nebulizers, Pulmicort, Brovana, IV steroid taper, empiric IV cefepime and IV vancomycin. Follow.  #4 history of thyrotoxicosis/hypothyroidism Continue methimazole.  #5 borderline diabetes mellitus Check a hemoglobin A1c. Sliding scale insulin.  #6 chronic kidney disease stage III Stable.  #7 hypertension Stable.  #8 dehydration Gentle hydration with IV fluids.  #9 constipation Her daughter patient recently placed on laxatives and patient with watery loose stools. Will hold off in any bowel regimen at this time.  Monitor.  #10 headache/ringing use Patient's daughter concerned zyrtec is causing ringing in patient's ears. Will hold Zyrtec. Will get a CT head without contrast. Follow.  #11 gastroesophageal reflux disease Pepcid.   DVT prophylaxis: Heparin Code Status: Full Family Communication: Updated patient and daughter at bedside. Disposition Plan: Back to assisted living facility when medically stable and hypoxia has resolved. Consults called: None Admission status: Admit to telemtry   [facility] MD [facility] Pager  [phone]  If 7PM-7AM, please contact night-coverage [email or web address] Password [email or web address]  04/23/2016, 5:16 PM

## 2016-04-23 NOTE — Telephone Encounter (Signed)
Daughter, [name], states that she just got a call from the lady that sits with the patient and said that the patient is going to the ED d/t not being able to breath.  Pt is [facility]. Daughter states that she will give update if able and wants to make sure that Dr Kriste BasqueNadel is aware.   Will send to SN as FYI

## 2016-04-23 NOTE — ED Provider Notes (Signed)
CSN: [id number]     Arrival date & time 04/23/16  1305 History   First MD Initiated Contact with Patient 04/23/16 1319     Chief Complaint  Patient presents with  . Shortness of Breath     (Consider location/radiation/quality/duration/timing/severity/associated sxs/prior Treatment) HPI Patient states she's had shortness of breath that has been increasing fairly quickly over the past several days. She had been seen by her pulmonologist approximate 3 days ago and no specific etiology identified. She reports she's much worse today. Her daughters think she might have had a fever 5 days ago because she kept a fan on her all night and was trying to cool her self with a washcloth. Is no report from nursing home documented fever. Patient denied any significant amount of cough. Of note however she is coughing intermittently in the room and cough sounds wet.. She denies chest pain. The patient had reported missing several doses of medication however the patient's daughter reports that just because she is confused and she gets her medications administer to her by nursing staff. Her daughter does however report that 3 days ago she did not get her medications because she was having trouble swallowing. Her daughter reports patient has been complaining frequently of nausea. Patient no noted LE swelling and denies LE pain. Past Medical History  Diagnosis Date  . Shortness of breath   . Unspecified essential hypertension   . Right bundle branch block   . Other and unspecified hyperlipidemia   . Other abnormal glucose   . Diverticulosis of colon (without mention of hemorrhage)   . Pyelonephritis, unspecified   . Solitary cyst of breast   . Osteoarthrosis, unspecified whether generalized or localized, unspecified site   . Lumbago   . Spinal stenosis, unspecified region other than cervical   . Anemia, unspecified   . GERD (gastroesophageal reflux disease)   . Pyelonephritis   . DJD (degenerative joint  disease)   . UTI (lower urinary tract infection)    Past Surgical History  Procedure Laterality Date  . Cataract extraction    . Right hip hemiarthroplasty      total  . Conversion to right thr     Family History  Problem Relation Age of Onset  . Cancer Brother   . Cancer Brother    Social History  Substance Use Topics  . Smoking status: Never Smoker   . Smokeless tobacco: Never Used  . Alcohol Use: No   OB History    No data available     Review of Systems 10 Systems reviewed and are negative for acute change except as noted in the HPI.   Allergies  Azithromycin; Ciprofloxacin; Levofloxacin; Furosemide; Latex; and Other  Home Medications   Prior to Admission medications   Medication Sig Start Date End Date Taking? Authorizing Provider  acetaZOLAMIDE (DIAMOX) 250 MG tablet TAKE (1) TABLET BY MOUTH EACH MORNING. Patient taking differently: TAKE 250 MG BY MOUTH EACH MORNING. 01/17/16  Yes Michele Mcalpine, MD  ADVAIR DISKUS 100-50 MCG/DOSE AEPB INHALE 1 PUFF BY MOUTH TWICE DAILY. RINSE MOUTH AFTER USE TO PREVENT THRUSH. 04/03/16  Yes Michele Mcalpine, MD  ALPRAZolam Prudy Feeler) 0.25 MG tablet Take 1 tablet (0.25 mg total) by mouth at bedtime as needed for sleep. Patient taking differently: Take 0.25 mg by mouth 2 (two) times daily.  03/26/16  Yes Michele Mcalpine, MD  amLODipine (NORVASC) 5 MG tablet Take 1 tablet (5 mg total) by mouth daily. 03/23/16  Yes Lonzo Cloud  Kriste Basque, MD  aspirin EC 81 MG tablet Take 81 mg by mouth daily.   Yes Historical Provider, MD  cetirizine (ZYRTEC) 10 MG tablet TAKE 1 TABLET BY MOUTH ONCE DAILY FOR ALLERGIES. Patient taking differently: TAKE 10 MG BY MOUTH ONCE DAILY FOR ALLERGIES. 03/01/16  Yes Michele Mcalpine, MD  Cholecalciferol (VITAMIN D3) 2000 UNITS capsule Take 1 capsule (2,000 Units total) by mouth daily. 08/16/15  Yes Michele Mcalpine, MD  fluticasone (FLONASE) 50 MCG/ACT nasal spray SPRAY 2 SPRAYS INTO EACH NOSTRIL ONCE DAILY. 03/16/16  Yes Bevelyn Ngo, NP   guaifenesin (ROBITUSSIN) 100 MG/5ML syrup Take 200 mg by mouth 3 (three) times daily as needed for cough.   Yes Historical Provider, MD  methimazole (TAPAZOLE) 5 MG tablet Take 1 tablet (5 mg total) by mouth every other day. 03/01/16  Yes Carlus Pavlov, MD  Multiple Vitamin (DAILY-VITE) TABS TAKE ONE TABLET BY MOUTH ONCE DAILY. 04/09/16  Yes Michele Mcalpine, MD  ondansetron (ZOFRAN) 4 MG tablet Take 1 tablet (4 mg total) by mouth every 6 (six) hours as needed for nausea or vomiting. 04/20/16  Yes Praveen Mannam, MD  polyethylene glycol (MIRALAX / GLYCOLAX) packet Take 17 g by mouth daily as needed for mild constipation.    Yes Historical Provider, MD  potassium chloride (K-DUR) 10 MEQ tablet Take 2 tablets (20 mEq total) by mouth 2 (two) times daily. 03/15/16  Yes Michele Mcalpine, MD  PROAIR HFA 108 (90 BASE) MCG/ACT inhaler INHALE 2 PUFFS EVERY 4 HOURS AS NEEDED Patient taking differently: INHALE 2 PUFFS EVERY 4 HOURS AS NEEDED FOR SOB, WHEEZING 06/13/15  Yes Michele Mcalpine, MD  Q-NOL 325 MG tablet TAKE (1) TABLET BY MOUTH EVERY FOUR HOURS AS NEEDED FOR PAIN. 04/13/16  Yes Michele Mcalpine, MD  ranitidine (ZANTAC) 150 MG tablet TAKE ONE TABLET BY MOUTH ONCE DAILY. Patient taking differently: TAKE 150 MG BY MOUTH ONCE DAILY. 03/30/16  Yes Michele Mcalpine, MD  sertraline (ZOLOFT) 50 MG tablet TAKE (1) TABLET BY MOUTH AT BEDTIME. Patient taking differently: Take 50 mg by mouth at bedtime.  03/23/16  Yes Michele Mcalpine, MD  Spacer/Aero-Holding Chambers (AEROCHAMBER PLUS WITH MASK) inhaler Use as instructed 06/12/15  Yes Arby Barrette, MD  torsemide (DEMADEX) 20 MG tablet TAKE 1/2 TABLET BY MOUTH EVERY MORNING. Patient taking differently: TAKE 20 MG BY MOUTH EVERY MORNING. 01/24/16  Yes Michele Mcalpine, MD   BP 121/67 mmHg  Pulse 74  Temp(Src) 97.4 F (36.3 C) (Oral)  Resp 16  SpO2 94% Physical Exam  Constitutional: She appears well-developed and well-nourished.  Alert and nonToxic. Mild increased WOB. Color good.   HENT:  Head: Normocephalic and atraumatic.  Mouth/Throat: Oropharynx is clear and moist.  Eyes: EOM are normal. Pupils are equal, round, and reactive to light.  Neck: Neck supple.  Cardiovascular: Normal rate, regular rhythm, normal heart sounds and intact distal pulses.   Distant HS, cannot appreciate rub, murmur, gallop  Pulmonary/Chest:  Mild increased WOB, rales and wheeze left lung mid to lower, right significant decreased BS mid to lower. Intrmittent wet cough  Abdominal: Soft. Bowel sounds are normal. She exhibits no distension. There is no tenderness.  Musculoskeletal: Normal range of motion. She exhibits edema. She exhibits no tenderness.  Trace ankle edema  Neurological: She is alert. She has normal strength. She exhibits normal muscle tone. Coordination normal. GCS eye subscore is 4. GCS verbal subscore is 5. GCS motor subscore is 6.  Skin: Skin  is warm, dry and intact.  Psychiatric: She has a normal mood and affect.    ED Course  Procedures (including critical care time) Labs Review Labs Reviewed  BASIC METABOLIC PANEL - Abnormal; Notable for the following:    Chloride 97 (*)    BUN 27 (*)    Creatinine, Ser 1.20 (*)    GFR calc non Af Amer 38 (*)    GFR calc Af Amer 44 (*)    All other components within normal limits  CBC WITH DIFFERENTIAL/PLATELET - Abnormal; Notable for the following:    WBC 3.8 (*)    All other components within normal limits  TROPONIN I  BRAIN NATRIURETIC PEPTIDE  BLOOD GAS, ARTERIAL    Imaging Review Dg Chest 2 View  04/23/2016  CLINICAL DATA:  Increasing shortness of breath for 3 days. EXAM: CHEST  2 VIEW COMPARISON:  04/20/2016 FINDINGS: Stable elevation of the right hemidiaphragm. No confluent opacities or effusions. Heart is normal size. No acute bony abnormality. IMPRESSION: Elevated right hemidiaphragm, stable.  No active disease. Electronically Signed   By: Charlett NoseKevin  Dover M.D.   On: 04/23/2016 13:52   I have personally reviewed and  evaluated these images and lab results as part of my medical decision-making.   EKG Interpretation   Date/Time:  Monday April 23 2016 13:30:49 EDT Ventricular Rate:  83 PR Interval:  287 QRS Duration: 130 QT Interval:  381 QTC Calculation: 448 R Axis:   -79 Text Interpretation:  Sinus or ectopic atrial rhythm Prolonged PR interval  Consider left atrial enlargement Right bundle branch block Inferior  infarct, old Lateral leads are also involved Confirmed by Donnald GarrePfeiffer, MD,  Lebron ConnersMarcy [phone] on 04/23/2016 4:08:57 PM     Consult: Reviewed with [facility] for admission. MDM   Final diagnoses:  COPD exacerbation (HCC)  Hypoxia  Cough   Patient presents with several days history of general malaise and cough. Patient's daughter suspects fever but none as been documented. The patient became more dyspneic and hypoxic today. She does have focal wheezes and rail on the left with decreased breath sounds at the right. Patient has required increased from her baseline 2.5 L nasal cannula oxygen to 4 L to maintain oxygen saturation to the mid 90s. Chest x-ray does not show focal consolidation. At this time, suspect COPD exacerbation possibly due to viral illness that has manifested over the past several days with subjective fever and malaise.    Arby BarretteMarcy Lee Kuang, MD 04/23/16 [phone]

## 2016-04-23 NOTE — Progress Notes (Addendum)
EDCM spoke to [name] and her daughter at bedside.  [name] confirms she is living at [facility].  [name] confirms her pcp is Dr. Alroy DustScott Nadel.  She reports she receives assistance with ADL's and medications.  [name]'s daughter reports the family has someone who goes to stay with the [name] once in a while to stay with her and assist her.  [name] has a rollator at the facility.  [name] noted to be wearing oxygen in the ED, [name] does not wear oxygen at the facility.  Plan is for [name] to go back to [facility] per [name] and her daughter.    [name] does not have her dentures with her.  [name]'s daughter reports she will check with the facility to see if they have them.  [name]'s daughter is shirley [phone]

## 2016-04-23 NOTE — Telephone Encounter (Signed)
Daughter, [name], states that she just got a call from the lady that sits with the patient and said that the patient is going to the ED d/t not being able to breath.  [facility]. Daughter states that she will give update if able and wants to make sure that Dr Kriste BasqueNadel is aware. Will close this message as there is another message open regarding the same thing.

## 2016-04-23 NOTE — Telephone Encounter (Signed)
Patient daughter came into the office and is wondering if patient's issue is relating to her taking Zyrtec daily instead of as needed. Patient is having headaches, ringing in the ear, and trouble sleeping at night. Daughter [name] would like a call back at [phone]

## 2016-04-23 NOTE — Telephone Encounter (Signed)
Pt currently [facility]. Spoke with pt's daughter, she wants to know if the zyrtec could be causing the ringing in her ears and keeping her up at night.  Gwendolyn ForestShirley wants to know if if this is something that she should discontinue because of this.  Requesting SN's recs for when pt is discharged.    SN please advise on med recs.  Thanks.

## 2016-04-23 NOTE — ED Notes (Signed)
Bed: [id number] Expected date:  Expected time:  Means of arrival:  Comments: EMS- shortness of breath, 82 yo

## 2016-04-23 NOTE — ED Notes (Signed)
Per EMS, patient is from [facility].  Patient is complaining of SOB and anxiety this morning.  She used her inhaler and stated she was fine.  Per EMS, lungs are clear.  Patient has been having the issue of dyspnea for months; especially when she is walking.   VS:  BP: 152/80 P:80 O2: 4 L 95-96% but on room air 82%

## 2016-04-23 NOTE — Telephone Encounter (Signed)
Per SN- Not likely to be causing the symptoms but it is certainly ok to stop the zyrtec.   Spoke with pt's daughter [name] to make aware.  Nothing further needed.

## 2016-04-24 DIAGNOSIS — L8990 Pressure ulcer of unspecified site, unspecified stage: Secondary | ICD-10-CM | POA: Insufficient documentation

## 2016-04-24 DIAGNOSIS — R0902 Hypoxemia: Secondary | ICD-10-CM

## 2016-04-24 LAB — CBC WITH DIFFERENTIAL/PLATELET
BASOS ABS: 0 10*3/uL (ref 0.0–0.1)
BASOS PCT: 0 %
Eosinophils Absolute: 0 10*3/uL (ref 0.0–0.7)
Eosinophils Relative: 0 %
HEMATOCRIT: 41.6 % (ref 36.0–46.0)
HEMOGLOBIN: 13.8 g/dL (ref 12.0–15.0)
LYMPHS PCT: 17 %
Lymphs Abs: 0.7 10*3/uL (ref 0.7–4.0)
MCH: 29.6 pg (ref 26.0–34.0)
MCHC: 33.2 g/dL (ref 30.0–36.0)
MCV: 89.1 fL (ref 78.0–100.0)
MONO ABS: 0 10*3/uL — AB (ref 0.1–1.0)
MONOS PCT: 1 %
NEUTROS PCT: 82 %
Neutro Abs: 3.4 10*3/uL (ref 1.7–7.7)
Platelets: 155 10*3/uL (ref 150–400)
RBC: 4.67 MIL/uL (ref 3.87–5.11)
RDW: 13.8 % (ref 11.5–15.5)
WBC: 4.1 10*3/uL (ref 4.0–10.5)

## 2016-04-24 LAB — BASIC METABOLIC PANEL
Anion gap: 7 (ref 5–15)
BUN: 30 mg/dL — ABNORMAL HIGH (ref 6–20)
CHLORIDE: 100 mmol/L — AB (ref 101–111)
CO2: 28 mmol/L (ref 22–32)
Calcium: 8.8 mg/dL — ABNORMAL LOW (ref 8.9–10.3)
Creatinine, Ser: 1.19 mg/dL — ABNORMAL HIGH (ref 0.44–1.00)
GFR calc Af Amer: 45 mL/min — ABNORMAL LOW (ref >60)
GFR calc non Af Amer: 39 mL/min — ABNORMAL LOW (ref >60)
GLUCOSE: 194 mg/dL — AB (ref 65–99)
Potassium: 4.4 mmol/L (ref 3.5–5.1)
SODIUM: 135 mmol/L (ref 135–145)

## 2016-04-24 LAB — GLUCOSE, CAPILLARY
GLUCOSE-CAPILLARY: 160 mg/dL — AB (ref 65–99)
GLUCOSE-CAPILLARY: 193 mg/dL — AB (ref 65–99)
Glucose-Capillary: 136 mg/dL — ABNORMAL HIGH (ref 65–99)
Glucose-Capillary: 196 mg/dL — ABNORMAL HIGH (ref 65–99)

## 2016-04-24 LAB — STREP PNEUMONIAE URINARY ANTIGEN: STREP PNEUMO URINARY ANTIGEN: NEGATIVE

## 2016-04-24 LAB — HIV ANTIBODY (ROUTINE TESTING W REFLEX): HIV Screen 4th Generation wRfx: NONREACTIVE

## 2016-04-24 MED ORDER — MAGNESIUM HYDROXIDE 400 MG/5ML PO SUSP
30.0000 mL | Freq: Once | ORAL | Status: AC
  Administered 2016-04-24: 30 mL via ORAL
  Filled 2016-04-24: qty 30

## 2016-04-24 MED ORDER — METHYLPREDNISOLONE SODIUM SUCC 125 MG IJ SOLR
60.0000 mg | Freq: Two times a day (BID) | INTRAMUSCULAR | Status: DC
Start: 2016-04-25 — End: 2016-04-26
  Administered 2016-04-25 – 2016-04-26 (×3): 60 mg via INTRAVENOUS
  Filled 2016-04-24 (×3): qty 2

## 2016-04-24 NOTE — Evaluation (Signed)
Physical Therapy Evaluation Patient Details Name: [name] MRN: [id number] DOB: [birth year] Today's Date: 04/24/2016   History of Present Illness  82 y.o. female with medical history significant of hyperthyroidism, hyperlipidemia, hypertension, spinal stenosis, gastroesophageal reflux disease, COPD, anxiety and admitted for acute respiratory failure with hypoxia.  Clinical Impression  Pt admitted with above diagnosis. Pt currently with functional limitations due to the deficits listed below (see PT Problem List).  Pt will benefit from skilled PT to increase their independence and safety with mobility to allow discharge to the venue listed below.  Pt from ALF and modified independent with rollator at baseline.  Pt currently requiring 4L supplemental oxygen and reports dyspnea with activity.  Pt agreeable to HHPT upon d/c.     Follow Up Recommendations Home health PT    Equipment Recommendations  None recommended by PT    Recommendations for Other Services       Precautions / Restrictions Precautions Precautions: Fall Precaution Comments: monitor sats      Mobility  Bed Mobility Overal bed mobility: Needs Assistance Bed Mobility: Supine to Sit;Sit to Supine     Supine to sit: Supervision Sit to supine: Supervision   General bed mobility comments: supervision for lines  Transfers Overall transfer level: Needs assistance Equipment used: Rolling walker (2 wheeled) Transfers: Sit to/from [employer] CorporationStand;Stand Pivot Transfers Sit to Stand: Min guard Stand pivot transfers: Min guard       General transfer comment: min/guard for safety and lines  Ambulation/Gait Ambulation/Gait assistance: Min guard Ambulation Distance (Feet): 30 Feet Assistive device: Rolling walker (2 wheeled) Gait Pattern/deviations: Step-through pattern;Trunk flexed;Decreased stride length     General Gait Details: pt ambulated around her room, remained on 4L O2 [place] and SpO2 92% upon sitting, pt with 2/4  dyspnea  Stairs            Wheelchair Mobility    Modified Rankin (Stroke Patients Only)       Balance                                             Pertinent Vitals/Pain Pain Assessment: No/denies pain    Home Living Family/patient expects to be discharged to:: Assisted living               Home Equipment: Walker - 4 wheels      Prior Function Level of Independence: Independent with assistive device(s)         Comments: does have a personal caregiver that checks in on her often and will assist although pt and daughter report pt is modified independent with rollator for mobility and ADLs     Hand Dominance        Extremity/Trunk Assessment               Lower Extremity Assessment: Generalized weakness      Cervical / Trunk Assessment: Kyphotic  Communication   Communication: HOH  Cognition Arousal/Alertness: Awake/alert Behavior During Therapy: WFL for tasks assessed/performed Overall Cognitive Status: Within Functional Limits for tasks assessed                      General Comments      Exercises        Assessment/Plan    PT Assessment Patient needs continued PT services  PT Diagnosis Difficulty walking;Generalized weakness   PT Problem List Decreased strength;Decreased  activity tolerance;Decreased mobility;Cardiopulmonary status limiting activity  PT Treatment Interventions DME instruction;Gait training;Patient/family education;Functional mobility training;Therapeutic activities;Therapeutic exercise   PT Goals (Current goals can be found in the Care Plan section) Acute Rehab PT Goals PT Goal Formulation: With patient Time For Goal Achievement: 05/01/16 Potential to Achieve Goals: Good    Frequency Min 3X/week   Barriers to discharge        Co-evaluation               End of Session Equipment Utilized During Treatment: Oxygen;Gait belt Activity Tolerance: Patient limited by  fatigue Patient left: in bed;with call bell/phone within reach;with bed alarm set;with family/visitor present           Time: 1030-1052 PT Time Calculation (min) (ACUTE ONLY): 22 min   Charges:   PT Evaluation $PT Eval Low Complexity: 1 Procedure     PT G Codes:        Vihan Santagata,KATHrine E 04/24/2016, 12:11 PM Zenovia Jarred, PT, DPT 04/24/2016 Pager: [phone]

## 2016-04-24 NOTE — Evaluation (Signed)
Occupational Therapy Evaluation Patient Details Name: [name] MRN: [id number] DOB: [birth year] Today's Date: 04/24/2016    History of Present Illness 82 y.o. female with medical history significant of hyperthyroidism, hyperlipidemia, hypertension, spinal stenosis, gastroesophageal reflux disease, COPD, anxiety and admitted for acute respiratory failure with hypoxia.   Clinical Impression   Pt was admitted for the above. At baseline, she is mod I for adls and has a caregiver who checks on her during the day. She currently needs min guard for safety. Goals in acute are for supervision level.      Follow Up Recommendations  Supervision - Intermittent; increased assistance for ADLs as needed   Equipment Recommendations  None recommended by OT (has 3:1)    Recommendations for Other Services       Precautions / Restrictions Precautions Precautions: Fall Precaution Comments: monitor sats Restrictions Weight Bearing Restrictions: No      Mobility Bed Mobility Overal bed mobility: Needs Assistance Bed Mobility: Supine to Sit;Sit to Supine     Supine to sit: Supervision Sit to supine: Supervision   General bed mobility comments: supervision for lines  Transfers Overall transfer level: Needs assistance Equipment used: Rolling walker (2 wheeled) Transfers: Sit to/from [employer] Sit to Stand: Min guard Stand pivot transfers: Min guard       General transfer comment: min/guard for safety and lines    Balance                                            ADL Overall ADL's : Needs assistance/impaired     Grooming: Set up;Sitting       Lower Body Bathing: Min guard;Sit to/from stand       Lower Body Dressing: Min guard;Sit to/from stand   Toilet Transfer: Min guard;Stand-pivot;BSC;RW   Toileting- Architect and Hygiene: Minimal assistance;Sit to/from stand         General ADL Comments: when I arrived, pt  was calling to use bathroom.   She wanted to walk, however, she is on 4 liters of 02, and I did not have tank available in room.  Performed SPT.  Pt's buttocks is red:  assisted with hygiene when she stood. She is used to a RW with seat:  standard RW used.  Pt is able to cross legs for adls.  She was on 4 liters and dyspnea 2/4.  Sats were 96%. Encouraged pursed lip breathing     Vision     Perception     Praxis      Pertinent Vitals/Pain Pain Assessment: No/denies pain     Hand Dominance     Extremity/Trunk Assessment Upper Extremity Assessment Upper Extremity Assessment: Overall WFL for tasks assessed      Cervical / Trunk Assessment Cervical / Trunk Assessment: Kyphotic   Communication Communication Communication: HOH   Cognition Arousal/Alertness: Awake/alert Behavior During Therapy: WFL for tasks assessed/performed Overall Cognitive Status: Within Functional Limits for tasks assessed                     General Comments       Exercises       Shoulder Instructions      Home Living Family/patient expects to be discharged to:: Assisted living  Home Equipment: Walker - 4 wheels          Prior Functioning/Environment Level of Independence: Independent with assistive device(s)        Comments: does have a personal caregiver that checks in on her often and will assist although pt and daughter report pt is modified independent with rollator for mobility and ADLs    OT Diagnosis: Generalized weakness   OT Problem List: Decreased activity tolerance;Cardiopulmonary status limiting activity   OT Treatment/Interventions: Self-care/ADL training;DME and/or AE instruction;Patient/family education;Energy conservation    OT Goals(Current goals can be found in the care plan section) Acute Rehab OT Goals Patient Stated Goal: get back to being independent OT Goal Formulation: With patient/family Time For Goal  Achievement: 05/01/16 Potential to Achieve Goals: Good ADL Goals Pt Will Perform Grooming: with supervision;standing Pt Will Transfer to Toilet: with supervision;ambulating;bedside commode Pt Will Perform Toileting - Clothing Manipulation and hygiene: with supervision;sit to/from stand Additional ADL Goal #1: pt will complete LB adls with set up/supervision and initiate at least one rest break for energy conservation  OT Frequency: Min 2X/week   Barriers to D/C:            Co-evaluation              End of Session    Activity Tolerance: Patient tolerated treatment well Patient left: in bed;with call bell/phone within reach;with bed alarm set;with family/visitor present   Time: [id number] OT Time Calculation (min): 19 min Charges:  OT General Charges $OT Visit: 1 Procedure OT Evaluation $OT Eval Low Complexity: 1 Procedure G-Codes:    Pami Wool 04/24/2016, 3:41 PM Marica OtterMaryellen Nyari Olsson, OTR/L [phone] 04/24/2016

## 2016-04-24 NOTE — Clinical Social Work Note (Signed)
Clinical Social Work Assessment  Patient Details  Name: [name] MRN: [id number] Date of Birth: March 15, 1923  Date of referral:  04/24/16               Reason for consult:  Discharge Planning (patient admitted from [facility])                Permission sought to share information with:  Case Production designer, theatre/television/filmManager, Magazine features editoracility Contact Representative, Family Supports Permission granted to share information::  Yes, Verbal Permission Granted  Name::        Agency::  [facility], [facility]  Relationship::  Daughter Pension scheme managerhirley  Contact Information:     Housing/Transportation Living arrangements for the past 2 months:  [facility]isted [facility] Facility Source of Information:  Patient, Adult Children, Facility, Medical Team Patient Interpreter Needed:  None Criminal Activity/Legal Involvement Pertinent to Current Situation/Hospitalization:  No - Comment as needed Significant Relationships:  Adult Children, Merchandiser, retailCommunity Support Lives with:  Facility Resident Do you feel safe going back to the place where you live?  Yes Need for family participation in patient care:  Yes (Comment)  Care giving concerns:  No concerns noted by patient or family.  Patient plans to return to ALF at [place]. Per daughter: . She reports she receives assistance with ADL's and medications. Patient's daughter reports the family has someone who goes to stay with the patient once in a while to stay with her and assist her. Patient has a rollator at the facility. Patient noted to be wearing oxygen in the ED, patient does not wear oxygen at the facility. Plan is for patient to go back to [facility] per patient and her daughter.    Social Worker assessment / plan:  Will assist patient back to ALF once medically cleared. Spoke to [facility] who is agreeable to review clinicals and plan for her to return. Notified of need for HH at [place] and facility uses [facility].  Will update FL2 and follow acutely while on unit.  Employment status:   Retired Health and safety inspectornsurance information:  Armed forces operational officerMedicare, [place] Recommendations:  Home with Home Health Information / Referral to community resources:  Other (Comment Required) (none reported)  Patient/Family's Response to care:  Agreeable to plan  Patient/Family's Understanding of and Emotional Response to Diagnosis, Current Treatment, and Prognosis:  Patient and daughter understanding of current needs and reason for admission. Patient hopeful for return to ALF in the next few days.  Emotional Assessment Appearance:  Appears stated age Attitude/Demeanor/Rapport:  Other (cooperative ) Affect (typically observed):  Accepting, Adaptable Orientation:  Oriented to Self, Oriented to Place, Oriented to  Time, Oriented to Situation Alcohol / Substance use:  Not Applicable Psych involvement (Current and /or in the community):  No (Comment)  Discharge Needs  Concerns to be addressed:  No discharge needs identified Readmission within the last 30 days:  No Current discharge risk:  None Barriers to Discharge:  Continued Medical Work up   Raye SorrowCoble, Jamaia Brum N, LCSW 04/24/2016, 2:52 PM

## 2016-04-24 NOTE — NC FL2 (Signed)
[place] MEDICAID FL2 LEVEL OF CARE SCREENING TOOL     IDENTIFICATION  Patient Name: CHERRILL SCRIMA Birthdate: 1923/08/16 Sex: female Admission Date (Current Location): 04/23/2016  [facility] and [place] Number:  Producer, television/film/video and Address:  [facility],  [address] [place], [place] [address]      Provider Number: [id number]  Attending Physician Name and Address:  Rodolph Bong, MD  Relative Name and Phone Number:       Current Level of Care: Hospital Recommended Level of Care: Assisted Living Facility Prior Approval Number:    Date Approved/Denied:   PASRR Number:    Discharge Plan: Other (Comment) (return to ALF)    Current Diagnoses: Patient Active Problem List   Diagnosis Date Noted  . Acute respiratory failure with hypoxia (HCC) 04/23/2016  . HCAP (healthcare-associated pneumonia): Probable 04/23/2016  . COPD exacerbation (HCC) 04/23/2016  . Dehydration 04/23/2016  . CKD (chronic kidney disease), stage III 04/23/2016  . Headache   . Allergic rhinitis 01/11/2016  . Thyrotoxicosis 12/07/2015  . Fatigue 11/22/2015  . Insomnia 11/22/2015  . Choking sensation 12/09/2014  . Rash 12/09/2014  . COPD (chronic obstructive pulmonary disease) (HCC) 11/17/2014  . Chronic venous insufficiency 04/14/2014  . Abnormality of gait 08/17/2013  . Acute URI 07/29/2012  . GERD (gastroesophageal reflux disease) 09/12/2011  . Neck pain 06/18/2011  . Anxiety 05/14/2011  . Acute bronchitis 05/02/2011  . ONYCHOMYCOSIS 12/13/2008  . UTI 12/13/2008  . Bullous pemphigoid 12/13/2008  . EDEMA 10/21/2008  . Diverticulosis of large intestine 12/01/2007  . Constipation 12/01/2007  . PYELONEPHRITIS 12/01/2007  . BREAST CYST 12/01/2007  . Osteoarthritis 12/01/2007  . DYSPNEA 12/01/2007  . Spinal stenosis of lumbar region 10/28/2007  . Elevated lipids 10/27/2007  . ANEMIA 10/27/2007  . Essential hypertension 10/27/2007  . RIGHT BUNDLE BRANCH BLOCK 10/27/2007  .  LOW BACK PAIN SYNDROME 10/27/2007  . DIABETES MELLITUS, BORDERLINE 10/27/2007    Orientation RESPIRATION BLADDER Height & Weight     Self, Place  Normal Continent Weight: 177 lb (80.287 kg) Height:   (172.7 cm)  BEHAVIORAL SYMPTOMS/MOOD NEUROLOGICAL BOWEL NUTRITION STATUS      Continent Diet (regular)  AMBULATORY STATUS COMMUNICATION OF NEEDS Skin   Supervision Verbally PU Stage and Appropriate Care (Stage I -  Intact skin with non-blanchable redness of a localized area usually over a bony prominence  (buttocks)) PU Stage 1 Dressing: No Dressing                     Personal Care Assistance Level of Assistance  Bathing, Feeding, Dressing Bathing Assistance: Limited assistance Feeding assistance: Independent Dressing Assistance: Limited assistance     Functional Limitations Info  Sight, Hearing, Speech Sight Info: Impaired Hearing Info: Impaired Speech Info: Adequate    SPECIAL CARE FACTORS FREQUENCY                       Contractures Contractures Info: Not present    Additional Factors Info  Code Status, Allergies, Psychotropic, Insulin Sliding Scale Code Status Info: Full Code Allergies Info: Azithromycin, Ciprofloxacin, Levofloxacin, Furosemide, Latex, Other Psychotropic Info: zoloft Insulin Sliding Scale Info: 3x day       Current Medications (04/24/2016):  This is the current hospital active medication list Current Facility-Administered Medications  Medication Dose Route Frequency Provider Last Rate Last Dose  . 0.9 %  sodium chloride infusion   Intravenous Continuous Rodolph Bong, MD 75 mL/hr at 04/24/16 0815    .  acetaminophen (TYLENOL) tablet 650 mg  650 mg Oral Q6H PRN Rodolph Bong, MD       Or  . acetaminophen (TYLENOL) suppository 650 mg  650 mg Rectal Q6H PRN Rodolph Bong, MD      . acetaZOLAMIDE (DIAMOX) tablet 250 mg  250 mg Oral Daily Rodolph Bong, MD   250 mg at 04/24/16 1038  . ALPRAZolam Prudy Feeler) tablet 0.25 mg  0.25  mg Oral BID PRN Rodolph Bong, MD      . amLODipine (NORVASC) tablet 5 mg  5 mg Oral Daily Rodolph Bong, MD   5 mg at 04/24/16 1610  . aspirin EC tablet 81 mg  81 mg Oral Daily Rodolph Bong, MD   81 mg at 04/24/16 9604  . budesonide (PULMICORT) nebulizer solution 0.25 mg  0.25 mg Nebulization BID Rodolph Bong, MD   0.25 mg at 04/24/16 5409  . ceFEPIme (MAXIPIME) 1 g in dextrose 5 % 50 mL IVPB  1 g Intravenous Q12H Danford Bad, RPH   1 g at 04/24/16 8119  . cholecalciferol (VITAMIN D) tablet 2,000 Units  2,000 Units Oral Daily Rodolph Bong, MD   2,000 Units at 04/24/16 [phone]  . fluticasone (FLONASE) 50 MCG/ACT nasal spray 2 spray  2 spray Each Nare Daily Rodolph Bong, MD   2 spray at 04/24/16 1038  . guaiFENesin (ROBITUSSIN) 100 MG/5ML solution 200 mg  200 mg Oral TID PRN Rodolph Bong, MD      . heparin injection 5,000 Units  5,000 Units Subcutaneous Q8H Rodolph Bong, MD   5,000 Units at 04/24/16 1311  . insulin aspart (novoLOG) injection 0-15 Units  0-15 Units Subcutaneous TID WC Rodolph Bong, MD   3 Units at 04/24/16 1311  . ipratropium (ATROVENT) nebulizer solution 0.5 mg  0.5 mg Nebulization Q2H PRN Ramiro Harvest V, MD      . ipratropium (ATROVENT) nebulizer solution 0.5 mg  0.5 mg Nebulization QID Rodolph Bong, MD   0.5 mg at 04/24/16 1235  . levalbuterol (XOPENEX) nebulizer solution 0.63 mg  0.63 mg Nebulization Q2H PRN Rodolph Bong, MD      . levalbuterol Pauline Aus) nebulizer solution 0.63 mg  0.63 mg Nebulization QID Rodolph Bong, MD   0.63 mg at 04/24/16 1234  . [START ON 04/25/2016] methimazole (TAPAZOLE) tablet 5 mg  5 mg Oral QODAY Ramiro Harvest V, MD      . methylPREDNISolone sodium succinate (SOLU-MEDROL) 125 mg/2 mL injection 60 mg  60 mg Intravenous Q8H Rodolph Bong, MD   60 mg at 04/24/16 1311  . multivitamin with minerals tablet 1 tablet  1 tablet Oral Daily Rodolph Bong, MD   1 tablet at 04/24/16 1000  .  ondansetron (ZOFRAN) tablet 4 mg  4 mg Oral Q6H PRN Rodolph Bong, MD       Or  . ondansetron [facility]) injection 4 mg  4 mg Intravenous Q6H PRN Rodolph Bong, MD      . oxyCODONE (Oxy IR/ROXICODONE) immediate release tablet 5 mg  5 mg Oral Q4H PRN Rodolph Bong, MD      . potassium chloride (K-DUR) CR tablet 20 mEq  20 mEq Oral BID Rodolph Bong, MD   20 mEq at 04/24/16 2956  . ranitidine (ZANTAC) 150 MG/10ML syrup 150 mg  150 mg Oral QHS Rodolph Bong, MD   150 mg at 04/23/16 2218  . senna-docusate (Senokot-S) tablet  1 tablet  1 tablet Oral QHS PRN Rodolph Bonganiel Thompson V, MD      . sertraline (ZOLOFT) tablet 50 mg  50 mg Oral QHS Rodolph Bonganiel Thompson V, MD   50 mg at 04/23/16 2221  . sodium chloride flush (NS) 0.9 % injection 3 mL  3 mL Intravenous Q12H Rodolph Bonganiel Thompson V, MD   3 mL at 04/23/16 2221  . sodium phosphate (FLEET) 7-19 GM/118ML enema 1 enema  1 enema Rectal Once PRN Rodolph Bonganiel Thompson V, MD      . sorbitol 70 % solution 30 mL  30 mL Oral Daily PRN Rodolph Bonganiel Thompson V, MD         Discharge Medications: Please see discharge summary for a list of discharge medications.  Relevant Imaging Results:  Relevant Lab Results:   Additional Information HH at facility (facility uses [facility]     SS#:[id number]  Raye SorrowCoble, Tanita Palinkas N, Kentucky[place]CSW

## 2016-04-24 NOTE — Progress Notes (Signed)
PROGRESS NOTE    Gwendolyn RAWL  [name]:[id number] DOB: January 31, 1923 DOA: 04/23/2016 PCP: Michele Mcalpine, MD    Assessment & Plan:   Principal Problem:   Acute respiratory failure with hypoxia (HCC) Active Problems:   ANEMIA   Essential hypertension   Constipation   Spinal stenosis of lumbar region   LOW BACK PAIN SYNDROME   DYSPNEA   DIABETES MELLITUS, BORDERLINE   Anxiety   GERD (gastroesophageal reflux disease)   Chronic venous insufficiency   COPD (chronic obstructive pulmonary disease) (HCC)   Thyrotoxicosis   Allergic rhinitis   HCAP (healthcare-associated pneumonia): Probable   COPD exacerbation (HCC)   Dehydration   CKD (chronic kidney disease), stage III   Pressure ulcer  #1 acute respiratory failure with hypoxia Likely multifactorial secondary to acute COPD exacerbation and probable healthcare associated pneumonia versus bronchitis. Chest x-ray on admission was negative. CT chest without contrast with no acute infiltrate however does shows chronic bronchitis. Patient with clinical improvement. Continue oxygen, nebulizer treatments, Flonase, IV steroid taper, Pulmicort. IV cefepime. Discontinue IV vancomycin. Follow.  #2 acute COPD exacerbation Clinical improvement. Continue oxygen, scheduled nebulizer treatments, IV steroid taper, Pulmicort, empiric IV cefepime. Follow.  #3 HCAP pneumonia versus acute on chronic bronchitis Chest x-ray negative for any acute infiltrate. CT chest without contrast with no acute infiltrate however consents were chronic bronchitis. Patient with clinical improvement. Continue oxygen, nebs, IV cefepime.  #4 gastroesophageal reflux disease H2 blocker.  #5 thyrotoxicosis Continue Tapazole.  #6 chronic kidney disease stage III Stable.    DVT prophylaxis: Heparin Code Status: Full Family Communication: Updated patient. No family at bedside. Disposition Plan: Home when medically stable.   Consultants:   None  Procedures:   CT  head 04/23/2016  CT chest 04/23/2016   chest x-ray 04/23/2016    Antimicrobials:   IV cefepime 04/23/2016  IV vancomycin 04/23/2016>>>> 04/24/2016   Subjective: Patient eating lunch. Patient denies chest pain. Patient states some improvement of shortness of breath. Patient denies any headache. Patient denies any further ringing in her ears.   Objective: Filed Vitals:   04/24/16 1414 04/24/16 1708 04/24/16 2000 04/24/16 2112  BP: 129/56   127/66  Pulse: 74  76 84  Temp: 98.7 F (37.1 C)   98.3 F (36.8 C)  TempSrc: Oral   Oral  Resp: 19  20 20   Height:      Weight:      SpO2: 94% 94% 94% 93%    Intake/Output Summary (Last 24 hours) at 04/24/16 2246 Last data filed at 04/24/16 1342  Gross per 24 hour  Intake    490 ml  Output      0 ml  Net    490 ml   Filed Weights   04/23/16 1843  Weight: 80.287 kg (177 lb)    Examination:  General exam: Appears calm and comfortable  Respiratory system: Some coarse breath sounds scattered. Minimal to mild expiratory wheezing. Respiratory effort normal. Cardiovascular system: S1 & S2 heard, RRR. No JVD, murmurs, rubs, gallops or clicks. No pedal edema. Gastrointestinal system: Abdomen is nondistended, soft and nontender. No organomegaly or masses felt. Normal bowel sounds heard. Central nervous system: Alert and oriented. No focal neurological deficits. Extremities: Symmetric 5 x 5 power. Skin: No rashes, lesions or ulcers Psychiatry: Judgement and insight appear normal. Mood & affect appropriate.     Data Reviewed: I have personally reviewed following labs and imaging studies  CBC:  Recent Labs Lab 04/20/16 1513 04/23/16 1403 04/24/16 8119  WBC 6.6 3.8* 4.1  NEUTROABS 4.7 2.1 3.4  HGB 15.4* 14.7 13.8  HCT 46.1* 44.8 41.6  MCV 87.1 90.0 89.1  PLT 176.0 PLATELET CLUMPS NOTED ON SMEAR, COUNT APPEARS ADEQUATE 155   Basic Metabolic Panel:  Recent Labs Lab 04/20/16 1513 04/23/16 1403 04/24/16 0452  NA 137 135 135   K 4.2 4.3 4.4  CL 97 97* 100*  CO2 GLUCOSE 144* 99 194*  BUN 19 27* 30*  CREATININE 1.24* 1.20* 1.19*  CALCIUM 10.2 9.2 8.8*  MG  --  2.3  --    GFR: Estimated Creatinine Clearance: 34.3 mL/min (by C-G formula based on Cr of 1.19). Liver Function Tests:  Recent Labs Lab 04/20/16 1513  AST 22  ALT 13  ALKPHOS 109  BILITOT 0.5  PROT 8.3  ALBUMIN 4.4   No results for input(s): LIPASE, AMYLASE in the last 168 hours. No results for input(s): AMMONIA in the last 168 hours. Coagulation Profile: No results for input(s): INR, PROTIME in the last 168 hours. Cardiac Enzymes:  Recent Labs Lab 04/23/16 1403  TROPONINI <0.03   BNP (last 3 results)  Recent Labs  08/09/15 1047  PROBNP 72.0   HbA1C: No results for input(s): HGBA1C in the last 72 hours. CBG:  Recent Labs Lab 04/23/16 2243 04/24/16 0754 04/24/16 1206 04/24/16 1701 04/24/16 2108  GLUCAP 267* 160* 193* 136* 196*   Lipid Profile: No results for input(s): CHOL, HDL, LDLCALC, TRIG, CHOLHDL, LDLDIRECT in the last 72 hours. Thyroid Function Tests: No results for input(s): TSH, T4TOTAL, FREET4, T3FREE, THYROIDAB in the last 72 hours. Anemia Panel: No results for input(s): VITAMINB12, FOLATE, FERRITIN, TIBC, IRON, RETICCTPCT in the last 72 hours. Sepsis Labs: No results for input(s): PROCALCITON, LATICACIDVEN in the last 168 hours.  Recent Results (from the past 240 hour(s))  Urine culture     Status: None   Collection Time: 04/20/16  3:13 PM  Result Value Ref Range Status   Colony Count >=100,000 COLONIES/ML  Final   Organism ID, Bacteria Multiple bacterial morphotypes present, none  Final   Organism ID, Bacteria predominant. Suggest appropriate recollection if   Final   Organism ID, Bacteria clinically indicated.  Final  Culture, blood (routine x 2) Call MD if unable to obtain prior to antibiotics being given     Status: None (Preliminary result)   Collection Time: 04/23/16  5:50 PM  Result  Value Ref Range Status   Specimen Description BLOOD RIGHT HAND  Final   Special Requests BOTTLES DRAWN AEROBIC ONLY 5CC  Final   Culture   Final    NO GROWTH < 24 HOURS Performed at [facility]    Report Status PENDING  Incomplete  Culture, blood (routine x 2) Call MD if unable to obtain prior to antibiotics being given     Status: None (Preliminary result)   Collection Time: 04/23/16  5:51 PM  Result Value Ref Range Status   Specimen Description BLOOD RIGHT WRIST  Final   Special Requests BOTTLES DRAWN AEROBIC AND ANAEROBIC 5CC  Final   Culture   Final    NO GROWTH < 24 HOURS Performed at [facility]    Report Status PENDING  Incomplete  MRSA PCR Screening     Status: None   Collection Time: 04/23/16  6:42 PM  Result Value Ref Range Status   MRSA by PCR NEGATIVE NEGATIVE Final    Comment:        The GeneXpert MRSA  Assay (FDA approved for NASAL specimens only), is one component of a comprehensive MRSA colonization surveillance program. It is not intended to diagnose MRSA infection nor to guide or monitor treatment for MRSA infections.          Radiology Studies: Dg Chest 2 View  04/23/2016  CLINICAL DATA:  Increasing shortness of breath for 3 days. EXAM: CHEST  2 VIEW COMPARISON:  04/20/2016 FINDINGS: Stable elevation of the right hemidiaphragm. No confluent opacities or effusions. Heart is normal size. No acute bony abnormality. IMPRESSION: Elevated right hemidiaphragm, stable.  No active disease. Electronically Signed   By: Charlett NoseKevin  Dover M.D.   On: 04/23/2016 13:52   Ct Head Wo Contrast  04/23/2016  CLINICAL DATA:  Shortness of breath.  Confusion. EXAM: CT HEAD WITHOUT CONTRAST TECHNIQUE: Contiguous axial images were obtained from the base of the skull through the vertex without intravenous contrast. COMPARISON:  None. FINDINGS: There is mucosal thickening in the maxillary and ethmoid sinuses with a small amount of fluid in the left maxillary sinus. Mastoid  air cells and middle ears are well aerated. Bones and soft tissues are otherwise normal. Extracranial soft tissues are normal. No subdural, epidural, or subarachnoid hemorrhage. Cerebellum, brainstem, and basal cisterns are normal. No mass, mass effect, or midline shift. Ventricles and sulci are unremarkable. No acute cortical ischemia or infarct. IMPRESSION: Sinus disease as described above.  No other abnormalities. Electronically Signed   By: Gerome Samavid  Williams III M.D   On: 04/23/2016 18:19   Ct Chest Wo Contrast  04/23/2016  CLINICAL DATA:  Increasing shortness of breath for several days. EXAM: CT CHEST WITHOUT CONTRAST TECHNIQUE: Multidetector CT imaging of the chest was performed following the standard protocol without IV contrast. COMPARISON:  Chest radiographs obtained earlier today. Chest CTA dated 06/01/2009. FINDINGS: Mediastinum/Lymph Nodes: Atheromatous calcifications, including the the proximal coronary arteries. No enlarged lymph nodes. Small thyroid nodules without significant change. The largest is a densely calcified nodule measuring 10 mm. Lungs/Pleura: Mild right lower lobe atelectasis and calcified granuloma. Mild left lower lobe atelectasis. Mild biapical pleural and parenchymal scarring. Peripheral inferior left upper lobe nodular and linear scarring, without significant change, measuring 7 mm on image number 63 of series 8. Diffuse peribronchial thickening without significant change. Upper abdomen: No acute findings. Musculoskeletal: Thoracic and lower cervical spine degenerative changes. Interval approximately 40% T11 vertebral body superior endplate compression deformity without bony retropulsion or acute fracture lines. Anterior spur formation at the T10-11 level. IMPRESSION: 1. Mild bilateral lower lobe atelectasis. 2. Stable chronic bronchitic changes. 3. Stable mild multinodular thyroid. The long-term stability is compatible with a benign process. Electronically Signed   By: Beckie SaltsSteven  Reid  M.D.   On: 04/23/2016 18:28        Scheduled Meds: . acetaZOLAMIDE  250 mg Oral Daily  . amLODipine  5 mg Oral Daily  . aspirin EC  81 mg Oral Daily  . budesonide (PULMICORT) nebulizer solution  0.25 mg Nebulization BID  . ceFEPime (MAXIPIME) IV  1 g Intravenous Q12H  . cholecalciferol  2,000 Units Oral Daily  . fluticasone  2 spray Each Nare Daily  . heparin  5,000 Units Subcutaneous Q8H  . insulin aspart  0-15 Units Subcutaneous TID WC  . ipratropium  0.5 mg Nebulization QID  . levalbuterol  0.63 mg Nebulization QID  . [START ON 04/25/2016] methimazole  5 mg Oral QODAY  . methylPREDNISolone (SOLU-MEDROL) injection  60 mg Intravenous Q8H  . multivitamin with minerals  1 tablet Oral Daily  .  potassium chloride  20 mEq Oral BID  . ranitidine  150 mg Oral QHS  . sertraline  50 mg Oral QHS  . sodium chloride flush  3 mL Intravenous Q12H   Continuous Infusions: . sodium chloride 75 mL/hr at 04/24/16 1804     LOS: 1 day    Time spent: 35 minutes    THOMPSON,DANIEL, MD [facility] Pager [phone]  If 7PM-7AM, please contact night-coverage [email or web address] Password [email or web address] 04/24/2016, 10:46 PM

## 2016-04-25 LAB — HEMOGLOBIN A1C
Hgb A1c MFr Bld: 6.3 % — ABNORMAL HIGH (ref 4.8–5.6)
Mean Plasma Glucose: 134 mg/dL

## 2016-04-25 LAB — GLUCOSE, CAPILLARY
GLUCOSE-CAPILLARY: 124 mg/dL — AB (ref 65–99)
GLUCOSE-CAPILLARY: 173 mg/dL — AB (ref 65–99)
GLUCOSE-CAPILLARY: 139 mg/dL — AB (ref 65–99)
Glucose-Capillary: 119 mg/dL — ABNORMAL HIGH (ref 65–99)

## 2016-04-25 LAB — LEGIONELLA PNEUMOPHILA SEROGP 1 UR AG: L. PNEUMOPHILA SEROGP 1 UR AG: NEGATIVE

## 2016-04-25 MED ORDER — DOXYCYCLINE HYCLATE 100 MG PO TABS
100.0000 mg | ORAL_TABLET | Freq: Two times a day (BID) | ORAL | Status: DC
  Administered 2016-04-25 – 2016-04-26 (×3): 100 mg via ORAL
  Filled 2016-04-25 (×3): qty 1

## 2016-04-25 MED ORDER — CEFTRIAXONE SODIUM 1 G IJ SOLR
1.0000 g | INTRAMUSCULAR | Status: DC
  Administered 2016-04-25: 1 g via INTRAVENOUS
  Filled 2016-04-25 (×2): qty 10

## 2016-04-25 NOTE — Progress Notes (Signed)
PROGRESS NOTE    Gwendolyn DolphinMargaret J [name]  GNF:[id number] DOB: 12-09-1922 DOA: 04/23/2016 PCP: Michele McalpineNADEL,SCOTT M, MD      Brief Narrative:   Patient presented to the ED with a 3-4 day history of worsening SOB, cough and DOE. Patient with a sat of 75% on initial presentation and was placed on nasal cannula. CXR negative on admission. CT chest without contrast reveals chronic bronchitis with no acute infiltrate. Symptoms improving on oxygen, pulmnicort, atrovent, methylprednisolone, empiric IV cefepime.   Patient with some confusion and repeating statements. Head CT without contrast unremarkable. Caretaker by bedside today states is normal baseline for patient.    Assessment & Plan:   Principal Problem:   Acute respiratory failure with hypoxia (HCC) Active Problems:   ANEMIA   Essential hypertension   Constipation   Spinal stenosis of lumbar region   LOW BACK PAIN SYNDROME   DYSPNEA   DIABETES MELLITUS, BORDERLINE   Anxiety   GERD (gastroesophageal reflux disease)   Chronic venous insufficiency   COPD (chronic obstructive pulmonary disease) (HCC)   Thyrotoxicosis   Allergic rhinitis   HCAP (healthcare-associated pneumonia): Probable   COPD exacerbation (HCC)   Dehydration   CKD (chronic kidney disease), stage III   Pressure ulcer   Hypoxia  1. Acute respiratory failure with hypoxia - Evidenced by an O2 sat of 75% with evidence of respiratory distress, having an oxygen requirement of 4 L via nasal cannula. At baseline she does not require supplemental oxygen. -Likely secondary to COPD exacerbation -Continue supplemental oxygen, systemic steroids, antibiotic therapy, nebulizer treatments.  2. COPD, acute exacerbation - Symptoms improving, continue pulmnicort, atrovent, methylprednisolone 60 mg IV twice a day -CT scan of lungs reveals bronchitic changes, no acute infiltrate noted. - Switch IV cefepime to IV ceftriaxone and PO Doxycycline  - Attempt weaning off oxygen as above  3.   Hypertension. -Blood pressures controlled, continue amlodipine 5 mg by mouth daily  4.  Hyperthyroidism -She is on methimazole 5 mg every other day -Last TSH was checked on 04/12/2016, within normal limits at 3.87.   5.  Stage III chronic kidney disease -Baseline creatinine near 1.2 -Continue monitoring kidney function.  6.  History of borderline diabetes -Hemoglobin A1c 6.3  -Blood sugars likely elevated due to systemic steroids, provide sliding scale coverage with Accu-Cheks   DVT prophylaxis: Heparin Code Status: Full Family Communication: Caretaker at bedside Disposition Plan: SNF   Consultants:   None  Procedures:    None  Antimicrobials:    Doxycycline  Ceftriaxone   Subjective: Patient improving with some residual cough. Caretaker states patient seems back to baseline. Eating during exam.  Objective: Filed Vitals:   04/25/16 0500 04/25/16 0820 04/25/16 1219 04/25/16 1358  BP: 140/74   136/61  Pulse: 66   69  Temp: 98 F (36.7 C)   98.9 F (37.2 C)  TempSrc: Oral   Oral  Resp: 18   20  Height:      Weight: 81.557 kg (179 lb 12.8 oz)     SpO2: 96% 94% 93% 97%    Intake/Output Summary (Last 24 hours) at 04/25/16 1427 Last data filed at 04/25/16 1300  Gross per 24 hour  Intake   1235 ml  Output      0 ml  Net   1235 ml   Filed Weights   04/23/16 1843 04/25/16 0500  Weight: 80.287 kg (177 lb) 81.557 kg (179 lb 12.8 oz)    Examination:  General exam: Appears calm and comfortable,  in no acute distress  Respiratory system: Rhonchi and crackles throughout, some improvement with cough. Respiratory effort normal. Cardiovascular system: Distant heart sounds. S1 & S2 heard, RRR. No JVD, murmurs, rubs, gallops or clicks.  Gastrointestinal system: Abdomen is nondistended, soft and nontender. No organomegaly or masses felt. Normal bowel sounds heard. Central nervous system: Alert and oriented. Some confusion, repeating questions and statements. Caretaker  states at baseline. Extremities: Symmetric 5 x 5 power. Skin: No rashes, lesions or ulcers Psychiatry: Judgement and insight appear normal. Mood & affect appropriate.     Data Reviewed: I have personally reviewed following labs and imaging studies  CBC:  Recent Labs Lab 04/20/16 1513 04/23/16 1403 04/24/16 0452  WBC 6.6 3.8* 4.1  NEUTROABS 4.7 2.1 3.4  HGB 15.4* 14.7 13.8  HCT 46.1* 44.8 41.6  MCV 87.1 90.0 89.1  PLT 176.0 PLATELET CLUMPS NOTED ON SMEAR, COUNT APPEARS ADEQUATE 155   Basic Metabolic Panel:  Recent Labs Lab 04/20/16 1513 04/23/16 1403 04/24/16 0452  NA 137 135 135  K 4.2 4.3 4.4  CL 97 97* 100*  CO2 31 30 28   GLUCOSE 144* 99 194*  BUN 19 27* 30*  CREATININE 1.24* 1.20* 1.19*  CALCIUM 10.2 9.2 8.8*  MG  --  2.3  --    GFR: Estimated Creatinine Clearance: 34.5 mL/min (by C-G formula based on Cr of 1.19). Liver Function Tests:  Recent Labs Lab 04/20/16 1513  AST 22  ALT 13  ALKPHOS 109  BILITOT 0.5  PROT 8.3  ALBUMIN 4.4   No results for input(s): LIPASE, AMYLASE in the last 168 hours. No results for input(s): AMMONIA in the last 168 hours. Coagulation Profile: No results for input(s): INR, PROTIME in the last 168 hours. Cardiac Enzymes:  Recent Labs Lab 04/23/16 1403  TROPONINI <0.03   BNP (last 3 results)  Recent Labs  08/09/15 1047  PROBNP 72.0   HbA1C:  Recent Labs  04/23/16 1403  HGBA1C 6.3*   CBG:  Recent Labs Lab 04/24/16 0754 04/24/16 1206 04/24/16 1701 04/24/16 2108 04/25/16 1158  GLUCAP 160* 193* 136* 196* 173*   Lipid Profile: No results for input(s): CHOL, HDL, LDLCALC, TRIG, CHOLHDL, LDLDIRECT in the last 72 hours. Thyroid Function Tests: No results for input(s): TSH, T4TOTAL, FREET4, T3FREE, THYROIDAB in the last 72 hours. Anemia Panel: No results for input(s): VITAMINB12, FOLATE, FERRITIN, TIBC, IRON, RETICCTPCT in the last 72 hours. Urine analysis:    Component Value Date/Time   COLORURINE  YELLOW 04/23/2016 1603   APPEARANCEUR CLEAR 04/23/2016 1603   LABSPEC 1.006 04/23/2016 1603   PHURINE 6.0 04/23/2016 1603   GLUCOSEU NEGATIVE 04/23/2016 1603   GLUCOSEU NEGATIVE 04/20/2016 1513   HGBUR NEGATIVE 04/23/2016 1603   BILIRUBINUR NEGATIVE 04/23/2016 1603   KETONESUR NEGATIVE 04/23/2016 1603   PROTEINUR NEGATIVE 04/23/2016 1603   UROBILINOGEN 0.2 04/20/2016 1513   NITRITE NEGATIVE 04/23/2016 1603   LEUKOCYTESUR NEGATIVE 04/23/2016 1603   Sepsis Labs: @LABRCNTIP (procalcitonin:4,lacticidven:4)  ) Recent Results (from the past 240 hour(s))  Urine culture     Status: None   Collection Time: 04/20/16  3:13 PM  Result Value Ref Range Status   Colony Count >=100,000 COLONIES/ML  Final   Organism ID, Bacteria Multiple bacterial morphotypes present, none  Final   Organism ID, Bacteria predominant. Suggest appropriate recollection if   Final   Organism ID, Bacteria clinically indicated.  Final  Culture, Urine     Status: Abnormal (Preliminary result)   Collection Time: 04/23/16  4:03 PM  Result Value Ref Range Status   Specimen Description URINE, CLEAN CATCH  Final   Special Requests NONE  Final   Culture >=100,000 COLONIES/mL GRAM NEGATIVE RODS (A)  Final   Report Status PENDING  Incomplete  Culture, blood (routine x 2) Call MD if unable to obtain prior to antibiotics being given     Status: None (Preliminary result)   Collection Time: 04/23/16  5:50 PM  Result Value Ref Range Status   Specimen Description BLOOD RIGHT HAND  Final   Special Requests BOTTLES DRAWN AEROBIC ONLY 5CC  Final   Culture   Final    NO GROWTH < 24 HOURS Performed at [facility]    Report Status PENDING  Incomplete  Culture, blood (routine x 2) Call MD if unable to obtain prior to antibiotics being given     Status: None (Preliminary result)   Collection Time: 04/23/16  5:51 PM  Result Value Ref Range Status   Specimen Description BLOOD RIGHT WRIST  Final   Special Requests BOTTLES DRAWN  AEROBIC AND ANAEROBIC 5CC  Final   Culture   Final    NO GROWTH < 24 HOURS Performed at [facility]    Report Status PENDING  Incomplete  MRSA PCR Screening     Status: None   Collection Time: 04/23/16  6:42 PM  Result Value Ref Range Status   MRSA by PCR NEGATIVE NEGATIVE Final    Comment:        The GeneXpert MRSA Assay (FDA approved for NASAL specimens only), is one component of a comprehensive MRSA colonization surveillance program. It is not intended to diagnose MRSA infection nor to guide or monitor treatment for MRSA infections.          Radiology Studies: Ct Head Wo Contrast  04/23/2016  CLINICAL DATA:  Shortness of breath.  Confusion. EXAM: CT HEAD WITHOUT CONTRAST TECHNIQUE: Contiguous axial images were obtained from the base of the skull through the vertex without intravenous contrast. COMPARISON:  None. FINDINGS: There is mucosal thickening in the maxillary and ethmoid sinuses with a small amount of fluid in the left maxillary sinus. Mastoid air cells and middle ears are well aerated. Bones and soft tissues are otherwise normal. Extracranial soft tissues are normal. No subdural, epidural, or subarachnoid hemorrhage. Cerebellum, brainstem, and basal cisterns are normal. No mass, mass effect, or midline shift. Ventricles and sulci are unremarkable. No acute cortical ischemia or infarct. IMPRESSION: Sinus disease as described above.  No other abnormalities. Electronically Signed   By: Gerome Sam III M.D   On: 04/23/2016 18:19   Ct Chest Wo Contrast  04/23/2016  CLINICAL DATA:  Increasing shortness of breath for several days. EXAM: CT CHEST WITHOUT CONTRAST TECHNIQUE: Multidetector CT imaging of the chest was performed following the standard protocol without IV contrast. COMPARISON:  Chest radiographs obtained earlier today. Chest CTA dated 06/01/2009. FINDINGS: Mediastinum/Lymph Nodes: Atheromatous calcifications, including the the proximal coronary arteries. No  enlarged lymph nodes. Small thyroid nodules without significant change. The largest is a densely calcified nodule measuring 10 mm. Lungs/Pleura: Mild right lower lobe atelectasis and calcified granuloma. Mild left lower lobe atelectasis. Mild biapical pleural and parenchymal scarring. Peripheral inferior left upper lobe nodular and linear scarring, without significant change, measuring 7 mm on image number 63 of series 8. Diffuse peribronchial thickening without significant change. Upper abdomen: No acute findings. Musculoskeletal: Thoracic and lower cervical spine degenerative changes. Interval approximately 40% T11 vertebral body superior endplate compression deformity without bony  retropulsion or acute fracture lines. Anterior spur formation at the T10-11 level. IMPRESSION: 1. Mild bilateral lower lobe atelectasis. 2. Stable chronic bronchitic changes. 3. Stable mild multinodular thyroid. The long-term stability is compatible with a benign process. Electronically Signed   By: Beckie Salts M.D.   On: 04/23/2016 18:28        Scheduled Meds: . acetaZOLAMIDE  250 mg Oral Daily  . amLODipine  5 mg Oral Daily  . aspirin EC  81 mg Oral Daily  . budesonide (PULMICORT) nebulizer solution  0.25 mg Nebulization BID  . cefTRIAXone (ROCEPHIN)  IV  1 g Intravenous Q24H  . cholecalciferol  2,000 Units Oral Daily  . doxycycline  100 mg Oral Q12H  . fluticasone  2 spray Each Nare Daily  . heparin  5,000 Units Subcutaneous Q8H  . insulin aspart  0-15 Units Subcutaneous TID WC  . ipratropium  0.5 mg Nebulization QID  . levalbuterol  0.63 mg Nebulization QID  . methimazole  5 mg Oral QODAY  . methylPREDNISolone (SOLU-MEDROL) injection  60 mg Intravenous Q12H  . multivitamin with minerals  1 tablet Oral Daily  . potassium chloride  20 mEq Oral BID  . ranitidine  150 mg Oral QHS  . sertraline  50 mg Oral QHS  . sodium chloride flush  3 mL Intravenous Q12H   Continuous Infusions: . sodium chloride 75 mL/hr  at 04/25/16 0820     LOS: 2 days    Time spent: 35 mins    Amy Cathie Hoops, PA-S   [facility] Pager [phone]  If 7PM-7AM, please contact night-coverage [email or web address] Password [facility] 04/25/2016, 2:27 PM   Addendum  I personally evaluated patient on 04/25/2016 and agree with the above findings. Mrs. Gilles is a pleasant 56 -year-old female with a past medical history of COPD, current resident at skilled nursing facility, who does not require supplemental oxygen at baseline, transferred to the emergency department with complaints of shortness of breath associate with cough. On exam she had extensive wheezing and found to be in acute hypoxemic respiratory failure having an O2 sat of 75% on room air and required 4 L of supplemental oxygen. Symptoms secondary COPD exacerbation. She was started on systemic steroids along with empiric IV antibiotic therapy and nebulizer treatments. On my evaluation she continues to have expiratory wheezes although there seems to be interim improvement. Plan to continue Solu-Medrol at 60 mg IV twice a day along with ceftriaxone 1 g IV every 12 hours and doxycycline. Continue supportive care, anticipate discharge to her skilled nursing facility in the next 24-48 hours.

## 2016-04-25 NOTE — Evaluation (Signed)
Clinical/Bedside Swallow Evaluation Patient Details  Name: [name] MRN: [id number] Date of Birth: 12-21-1922  Today's Date: 04/25/2016 Time: SLP Start Time (ACUTE ONLY): 1301 SLP Stop Time (ACUTE ONLY): 1320 SLP Time Calculation (min) (ACUTE ONLY): 19 min  Past Medical History:  Past Medical History  Diagnosis Date  . Shortness of breath   . Unspecified essential hypertension   . Right bundle branch block   . Other and unspecified hyperlipidemia   . Other abnormal glucose   . Diverticulosis of colon (without mention of hemorrhage)   . Pyelonephritis, unspecified   . Solitary cyst of breast   . Osteoarthrosis, unspecified whether generalized or localized, unspecified site   . Lumbago   . Spinal stenosis, unspecified region other than cervical   . Anemia, unspecified   . GERD (gastroesophageal reflux disease)   . Pyelonephritis   . DJD (degenerative joint disease)   . UTI (lower urinary tract infection)    Past Surgical History:  Past Surgical History  Procedure Laterality Date  . Cataract extraction    . Right hip hemiarthroplasty      total  . Conversion to right thr     HPI:  [facility] 82 [facility]- recently had nausea without vomiting and concern for dehydration per pulmonary office note.  PMH + for GERD, multinodular goiter, anemia, COPD - sees Dr Kriste BasqueNadel, borderline DM2.  Pt reports she is not on oxygen prior to admission and she does not endorse xerostomia.  Pt CXR negative.  CT chest 04/25/16 showed multinodular thyroid and Mild bilateral atelectasis.  Caregiver [name] reports pt takes pills with applesauce at times.  Pt denies dysphagia.      Assessment / Plan / Recommendation Clinical Impression  Pt with mild oral deficits due to very ill fitting dentition - partial loose and "flops" around when pt talking.  Advised her to consider denture adhesive to help with secure fit.     Pt denies dysphagia and says her appetite is not  great.  Her caregiver, [name], admits pt takes her medications with puree at times.    No s/s of aspiration with all po observed *tea, applesauce*.  Did not observe pt with solids as she had just finished meal.  Swallow was timely and voice remained clear throughout.  Cough at baseline noted that did not worsen with po intake.    Educated pt/[name] to aspiration mitigation strategies given report of dyspnea with exertion.  Thanks for this referral.      Aspiration Risk  Mild aspiration risk    Diet Recommendation Dysphagia 3 (Mech soft);Thin liquid   Liquid Administration via: Cup;Straw Medication Administration:  (as tolerated, with pudding/puree if problematic) Supervision: Patient able to self feed Compensations: Slow rate;Small sips/bites Postural Changes: Seated upright at 90 degrees;Remain upright for at least 30 minutes after po intake    Other  Recommendations   none  Follow up Recommendations  None    Frequency and Duration            Prognosis        Swallow Study   General Date of Onset: 04/25/16 HPI: pt is a 82 yo female adm [facility] with [facility] difficulties- recently had nausea without vomiting and concern for dehydration per pulmonary office note.  PMH + for GERD, multinodular goiter, anemia, COPD - sees Dr Kriste BasqueNadel, borderline DM2.  Pt reports she is not on oxygen prior to admission and she does not endorse xerostomia.  Pt CXR negative.  CT chest 04/25/16 showed multinodular thyroid and Mild bilateral atelectasis.  Caregiver [name] reports pt takes pills with applesauce at times.  Pt denies dysphagia.    Type of Study: Bedside Swallow Evaluation Diet Prior to this Study: Thin liquids;Dysphagia 3 (soft) Temperature Spikes Noted: No Respiratory Status: Nasal cannula History of Recent Intubation: No Behavior/Cognition: Alert;Cooperative;Pleasant mood Oral Cavity Assessment: Within Functional Limits Oral Care Completed by SLP: No Oral Cavity - Dentition: Dentures,  top;Dentures, bottom Vision: Functional for self-feeding Self-Feeding Abilities: Able to feed self Patient Positioning: Upright in bed Baseline Vocal Quality: Normal Volitional Cough: Strong Volitional Swallow: Able to elicit    Oral/Motor/Sensory Function Overall Oral Motor/Sensory Function: Within functional limits   Ice Chips Ice chips: Not tested   Thin Liquid Thin Liquid: Within functional limits Presentation: Self Fed;Cup    Nectar Thick Nectar Thick Liquid: Not tested   Honey Thick Honey Thick Liquid: Not tested   Puree Puree: Within functional limits Presentation: Self Fed;Spoon   Solid   GO   Solid: Not tested        Donavan Burnet, MS [facility] SLP [phone]

## 2016-04-25 NOTE — Clinical Documentation Improvement (Signed)
Internal Medicine  Can the diagnosis of pressure ulcer be further specified by site and stage ? Thank you     Document Site with laterality - Elbow, Back (upper/lower), Sacral, Hip, Buttock, Ankle, Heel, Head, Other (Specify)  Pressure Ulcer Stage - Stage1, Stage 2, Stage 3, Stage 4, Unstageable, Unspecified, Unable to Clinically Determine  Other  Clinically Undetermined    Supporting Information:  Per nursing assessment:   "Stage 1 to buttocks"    Please exercise your independent, professional judgment when responding. A specific answer is not anticipated or expected.   Thank You,  Lavonda JumboLawanda J Jamilynn Whitacre [employer] [facility] [place] [phone]

## 2016-04-26 ENCOUNTER — Other Ambulatory Visit: Payer: Self-pay | Admitting: Pulmonary Disease

## 2016-04-26 LAB — BASIC METABOLIC PANEL
Anion gap: 4 — ABNORMAL LOW (ref 5–15)
BUN: 28 mg/dL — AB (ref 6–20)
CO2: 26 mmol/L (ref 22–32)
CREATININE: 0.96 mg/dL (ref 0.44–1.00)
Calcium: 8.6 mg/dL — ABNORMAL LOW (ref 8.9–10.3)
Chloride: 104 mmol/L (ref 101–111)
GFR calc Af Amer: 58 mL/min — ABNORMAL LOW (ref >60)
GFR, EST NON AFRICAN AMERICAN: 50 mL/min — AB (ref >60)
GLUCOSE: 153 mg/dL — AB (ref 65–99)
Potassium: 5 mmol/L (ref 3.5–5.1)
SODIUM: 134 mmol/L — AB (ref 135–145)

## 2016-04-26 LAB — CBC
HCT: 37.4 % (ref 36.0–46.0)
Hemoglobin: 12.3 g/dL (ref 12.0–15.0)
MCH: 29 pg (ref 26.0–34.0)
MCHC: 32.9 g/dL (ref 30.0–36.0)
MCV: 88.2 fL (ref 78.0–100.0)
PLATELETS: 160 10*3/uL (ref 150–400)
RBC: 4.24 MIL/uL (ref 3.87–5.11)
RDW: 14 % (ref 11.5–15.5)
WBC: 9.2 10*3/uL (ref 4.0–10.5)

## 2016-04-26 LAB — GLUCOSE, CAPILLARY
GLUCOSE-CAPILLARY: 115 mg/dL — AB (ref 65–99)
Glucose-Capillary: 255 mg/dL — ABNORMAL HIGH (ref 65–99)

## 2016-04-26 LAB — URINE CULTURE: Culture: >=100000 — AB

## 2016-04-26 MED ORDER — IPRATROPIUM-ALBUTEROL 0.5-2.5 (3) MG/3ML IN SOLN
3.0000 mL | RESPIRATORY_TRACT | Status: DC | PRN
Start: 2016-04-26 — End: 2016-04-26

## 2016-04-26 MED ORDER — ALPRAZOLAM 0.25 MG PO TABS: 0.2500 mg | ORAL_TABLET | Freq: Every evening | ORAL | Status: DC | PRN

## 2016-04-26 MED ORDER — IPRATROPIUM-ALBUTEROL 0.5-2.5 (3) MG/3ML IN SOLN: 3.0000 mL | RESPIRATORY_TRACT | Status: DC | PRN

## 2016-04-26 MED ORDER — DOXYCYCLINE HYCLATE 100 MG PO TABS: 100.0000 mg | ORAL_TABLET | Freq: Two times a day (BID) | ORAL | Status: DC

## 2016-04-26 MED ORDER — PREDNISONE 10 MG (21) PO TBPK: ORAL_TABLET | ORAL | Status: DC

## 2016-04-26 MED ORDER — FLUTICASONE-SALMETEROL 100-50 MCG/DOSE IN AEPB: 1.0000 | INHALATION_SPRAY | Freq: Two times a day (BID) | RESPIRATORY_TRACT | Status: DC

## 2016-04-26 NOTE — Discharge Summary (Addendum)
Physician Discharge Summary  [name] XLK:[id number] DOB: December 20, 1922 DOA: 04/23/2016  PCP: Michele Mcalpine, MD  Admit date: 04/23/2016 Discharge date: 04/26/2016  Time spent: 35 minutes  Recommendations for Outpatient Follow-up:  1. Follow up in 1 week with primary care for hospital discharge follow up.  2. Follow up in 1-2 weeks with Dr Alroy Dust of [facility] for COPD exacerbation. 3. Discharge to [facility] assisted living facility with home health care, RN, and physical therapy.    Discharge Diagnoses:  Principal Problem:   Acute respiratory failure with hypoxia (HCC) Active Problems:   ANEMIA   Essential hypertension   Constipation   Spinal stenosis of lumbar region   LOW BACK PAIN SYNDROME   DYSPNEA   DIABETES MELLITUS, BORDERLINE   Anxiety   GERD (gastroesophageal reflux disease)   Chronic venous insufficiency   COPD (chronic obstructive pulmonary disease) (HCC)   Thyrotoxicosis   Allergic rhinitis   HCAP (healthcare-associated pneumonia): Probable   COPD exacerbation (HCC)   Dehydration   CKD (chronic kidney disease), stage III   Pressure ulcer   Hypoxia   Discharge Condition: Stable  Diet recommendation: Low sodium; heart healthy   Filed Weights   04/23/16 1843 04/25/16 0500 04/26/16 0500  Weight: 80.287 kg (177 lb) 81.557 kg (179 lb 12.8 oz) 83.099 kg (183 lb 3.2 oz)    History of present illness:  [name] is a 82 y.o. female with medical history significant of hyperthyroidism, hyperlipidemia, hypertension, spinal stenosis, gastroesophageal reflux disease, COPD, anxiety who presents to the ED with a three-day history of worsening shortness of breath, wheezing, productive cough, subjective fevers, nausea, dysuria, generalized fatigue, headache, ringing in the ears. Patient also endorses some loose stools after being started on laxatives for constipation. Patient denies any chills, no emesis, no chest pain, no melena, no  hematemesis, no hematochezia. Patient doesn't on some constipation and was on laxatives for it. Patient also endorses some abdominal pain diffusely. Patient denies any visual changes. No slurred speech. No asymmetric weakness or numbness.  Hospital Course:  [name] is a pleasant 74 -year-old female with a past medical history of COPD, current resident at skilled nursing facility, who does not require supplemental oxygen at baseline, transferred to the emergency department with complaints of shortness of breath associate with cough. On exam she had extensive wheezing and found to be in acute hypoxemic respiratory failure having an O2 sat of 75% on room air and required 4 L of supplemental oxygen. Symptoms secondary COPD exacerbation. She was started on systemic steroids along with empiric IV antibiotic therapy and nebulizer treatments. Evaluation on 04/25/2016, she continues to have expiratory wheezes although there seems to be interim improvement. Continued Solu-Medrol at 60 mg IV twice a day along with ceftriaxone 1 g IV every 12 hours and doxycycline. Continued supportive care. On 04/26/2016, attempted ambulation off oxygen. She tolerated ambulation well with assistance of a walker. Her O2 sat after ambulation was 92% on room air. Lung sounds with improvement. Given clinical improvement, discharged at stable condition to skilled nursing facility.   1. Acute respiratory failure with hypoxia - Evidenced by an O2 sat of 75% with evidence of respiratory distress, having an oxygen requirement of 4 L via nasal cannula. At baseline she does not require supplemental oxygen. -Likely secondary to COPD exacerbation -By 04/26/2016 she should market clinical improvement with supplemental oxygen being titrated off. She and related down the hallway maintain oxygen saturations in the low 90s. She was discharged  on prednisone taper, DuoNeb's as needed, doxycycline, inhaled corticosteroid.  2. COPD, acute exacerbation -  Symptoms improving, continue pulmnicort, atrovent, methylprednisolone 60 mg IV twice a day -CT scan of lungs reveals bronchitic changes, no acute infiltrate noted. - Switched to IV cefepime to IV ceftriaxone and PO Doxycycline on 04/25/2016 - By 04/26/2016 she showed significant clinical improvement with supplemental oxygen being titrated off Discharged on: -Prednisone taper -Advair -Doxycycline x 5 days -DuoNeb's every 4 hours when necessary  3. Hypertension. -Blood pressures controlled, continue amlodipine 5 mg by mouth daily  4. Hyperthyroidism -She is on methimazole 5 mg every other day -Last TSH was checked on 04/12/2016, within normal limits at 3.87.   5. Stage III chronic kidney disease -Baseline creatinine near 1.2 -Continue monitoring kidney function.  6. History of borderline diabetes -Hemoglobin A1c 6.3   Procedures:  None  Consultations:  Speech pathology   Discharge Exam: Filed Vitals:   04/25/16 2157 04/26/16 0500  BP: 139/52 156/70  Pulse: 72 65  Temp: 98 F (36.7 C) 98.2 F (36.8 C)  Resp: 20 20    General: Appears calm and comfortable, in no acute distress Cardiovascular: Distant hear sounds. S1 & S2 heard, RRR. No JVD, murmurs, rubs, gallops or clicks Respiratory: Minimal rhonchi and expiratory wheezing. Respiratory effort normal  Abdomen: Nontender nondistended  Discharge Instructions   Discharge Instructions    Call MD for:  difficulty breathing, headache or visual disturbances    Complete by:  As directed      Call MD for:  extreme fatigue    Complete by:  As directed      Call MD for:  hives    Complete by:  As directed      Call MD for:  persistant dizziness or light-headedness    Complete by:  As directed      Call MD for:  persistant nausea and vomiting    Complete by:  As directed      Call MD for:  redness, tenderness, or signs of infection (pain, swelling, redness, odor or green/yellow discharge around incision site)     Complete by:  As directed      Call MD for:  severe uncontrolled pain    Complete by:  As directed      Call MD for:  temperature >100.4    Complete by:  As directed      Call MD for:    Complete by:  As directed      Diet - low sodium heart healthy    Complete by:  As directed      Increase activity slowly    Complete by:  As directed           Current Discharge Medication List    START taking these medications   Details  doxycycline (VIBRA-TABS) 100 MG tablet Take 1 tablet (100 mg total) by mouth 2 (two) times daily. Qty: 10 tablet, Refills: 0    ipratropium-albuterol (DUONEB) 0.5-2.5 (3) MG/3ML SOLN Take 3 mLs by nebulization every 4 (four) hours as needed. Qty: 360 mL, Refills: 0    predniSONE (STERAPRED UNI-PAK 21 TAB) 10 MG (21) TBPK tablet Take 6-5-4-3-2-1 tablets by mouth daily till gone. Qty: 21 tablet, Refills: 0      CONTINUE these medications which have NOT CHANGED   Details  acetaZOLAMIDE (DIAMOX) 250 MG tablet TAKE (1) TABLET BY MOUTH EACH MORNING. Qty: 30 tablet, Refills: 5    ADVAIR DISKUS 100-50 MCG/DOSE AEPB INHALE 1 PUFF  BY MOUTH TWICE DAILY. RINSE MOUTH AFTER USE TO PREVENT THRUSH. Qty: 60 each, Refills: 5    ALPRAZolam (XANAX) 0.25 MG tablet Take 1 tablet (0.25 mg total) by mouth at bedtime as needed for sleep. Qty: 30 tablet, Refills: 0    amLODipine (NORVASC) 5 MG tablet Take 1 tablet (5 mg total) by mouth daily. Qty: 30 tablet, Refills: 6    aspirin EC 81 MG tablet Take 81 mg by mouth daily.    cetirizine (ZYRTEC) 10 MG tablet TAKE 1 TABLET BY MOUTH ONCE DAILY FOR ALLERGIES. Qty: 30 tablet, Refills: 5    Cholecalciferol (VITAMIN D3) 2000 UNITS capsule Take 1 capsule (2,000 Units total) by mouth daily. Qty: 30 capsule, Refills: 6    fluticasone (FLONASE) 50 MCG/ACT nasal spray SPRAY 2 SPRAYS INTO EACH NOSTRIL ONCE DAILY. Qty: 16 g, Refills: 5    guaifenesin (ROBITUSSIN) 100 MG/5ML syrup Take 200 mg by mouth 3 (three) times daily as needed  for cough.    methimazole (TAPAZOLE) 5 MG tablet Take 1 tablet (5 mg total) by mouth every other day. Qty: 30 tablet, Refills: 1    Multiple Vitamin (DAILY-VITE) TABS TAKE ONE TABLET BY MOUTH ONCE DAILY. Qty: 30 tablet, Refills: 11    ondansetron (ZOFRAN) 4 MG tablet Take 1 tablet (4 mg total) by mouth every 6 (six) hours as needed for nausea or vomiting. Qty: 15 tablet, Refills: 0    polyethylene glycol (MIRALAX / GLYCOLAX) packet Take 17 g by mouth daily as needed for mild constipation.     PROAIR HFA 108 (90 BASE) MCG/ACT inhaler INHALE 2 PUFFS EVERY 4 HOURS AS NEEDED Qty: 8.5 g, Refills: 0    ranitidine (ZANTAC) 150 MG tablet TAKE ONE TABLET BY MOUTH ONCE DAILY. Qty: 30 tablet, Refills: 0    sertraline (ZOLOFT) 50 MG tablet TAKE (1) TABLET BY MOUTH AT BEDTIME. Qty: 30 tablet, Refills: 5    Spacer/Aero-Holding Chambers (AEROCHAMBER PLUS WITH MASK) inhaler Use as instructed Qty: 1 each, Refills: 2    torsemide (DEMADEX) 20 MG tablet TAKE 1/2 TABLET BY MOUTH EVERY MORNING. Qty: 15 tablet, Refills: 11      STOP taking these medications     potassium chloride (K-DUR) 10 MEQ tablet      Q-NOL 325 MG tablet      DELSYM 30 MG/5ML liquid        Allergies  Allergen Reactions  . Azithromycin Shortness Of Breath  . Ciprofloxacin Other (See Comments)     hallucinations  . Levofloxacin Other (See Comments)    Insomnia, indigestion, tingling sensation in legs  . Furosemide Rash    Bullous pemphigoid  . Latex Rash  . Other Rash    EKG leads caused a rash that required steroids to clear   Follow-up Information    Follow up with NADEL,SCOTT M, MD In 1 week.   Specialty:  Pulmonary Disease   Contact information:   [address] [place] [address] [phone]        The results of significant diagnostics from this hospitalization (including imaging, microbiology, ancillary and laboratory) are listed below for reference.    Significant Diagnostic Studies: Dg Chest  2 View  04/23/2016  CLINICAL DATA:  Increasing shortness of breath for 3 days. EXAM: CHEST  2 VIEW COMPARISON:  04/20/2016 FINDINGS: Stable elevation of the right hemidiaphragm. No confluent opacities or effusions. Heart is normal size. No acute bony abnormality. IMPRESSION: Elevated right hemidiaphragm, stable.  No active disease. Electronically Signed  By: Charlett Nose M.D.   On: 04/23/2016 13:52   Dg Chest 2 View  04/20/2016  CLINICAL DATA:  Wheezing for 2 days. EXAM: CHEST  2 VIEW COMPARISON:  January 11, 2016 FINDINGS: There is stable chronic elevation of the right hemidiaphragm. The interstitium is prominent in a generalized manner, stable. There is no edema or consolidation. The heart size and pulmonary vascularity are within normal limits. There is atherosclerotic calcification in the aortic arch region. No bone lesions are evident. IMPRESSION: Stable elevation of the right hemidiaphragm. Generalized interstitial prominence remains, likely due to chronic underlying fibrotic type change. No frank edema or consolidation evident. No change in cardiac silhouette. Electronically Signed   By: Bretta Bang III M.D.   On: 04/20/2016 16:15   Ct Head Wo Contrast  04/23/2016  CLINICAL DATA:  Shortness of breath.  Confusion. EXAM: CT HEAD WITHOUT CONTRAST TECHNIQUE: Contiguous axial images were obtained from the base of the skull through the vertex without intravenous contrast. COMPARISON:  None. FINDINGS: There is mucosal thickening in the maxillary and ethmoid sinuses with a small amount of fluid in the left maxillary sinus. Mastoid air cells and middle ears are well aerated. Bones and soft tissues are otherwise normal. Extracranial soft tissues are normal. No subdural, epidural, or subarachnoid hemorrhage. Cerebellum, brainstem, and basal cisterns are normal. No mass, mass effect, or midline shift. Ventricles and sulci are unremarkable. No acute cortical ischemia or infarct. IMPRESSION: Sinus disease as  described above.  No other abnormalities. Electronically Signed   By: Gerome Sam III M.D   On: 04/23/2016 18:19   Ct Chest Wo Contrast  04/23/2016  CLINICAL DATA:  Increasing shortness of breath for several days. EXAM: CT CHEST WITHOUT CONTRAST TECHNIQUE: Multidetector CT imaging of the chest was performed following the standard protocol without IV contrast. COMPARISON:  Chest radiographs obtained earlier today. Chest CTA dated 06/01/2009. FINDINGS: Mediastinum/Lymph Nodes: Atheromatous calcifications, including the the proximal coronary arteries. No enlarged lymph nodes. Small thyroid nodules without significant change. The largest is a densely calcified nodule measuring 10 mm. Lungs/Pleura: Mild right lower lobe atelectasis and calcified granuloma. Mild left lower lobe atelectasis. Mild biapical pleural and parenchymal scarring. Peripheral inferior left upper lobe nodular and linear scarring, without significant change, measuring 7 mm on image number 63 of series 8. Diffuse peribronchial thickening without significant change. Upper abdomen: No acute findings. Musculoskeletal: Thoracic and lower cervical spine degenerative changes. Interval approximately 40% T11 vertebral body superior endplate compression deformity without bony retropulsion or acute fracture lines. Anterior spur formation at the T10-11 level. IMPRESSION: 1. Mild bilateral lower lobe atelectasis. 2. Stable chronic bronchitic changes. 3. Stable mild multinodular thyroid. The long-term stability is compatible with a benign process. Electronically Signed   By: Beckie Salts M.D.   On: 04/23/2016 18:28   Dg Abd 2 Views  04/20/2016  CLINICAL DATA:  Fatigue and decreased appetite, history of hyperlipidemia, COPD, Constipation, EXAM: ABDOMEN - 2 VIEW COMPARISON:  Abdominal and pelvic CT scan of November 26, 2007 and abdominal ultrasound dated Apr 15, 2014 FINDINGS: The small and large bowel gas pattern is within the limits of normal. There is no  free extraluminal gas observed. There is elevation of the right hemidiaphragm which is chronic. There is curvature of the thoracolumbar spine. The pedicles are intact. The patient has undergone previous right hip prosthesis placement. IMPRESSION: No acute intra-abdominal abnormality is observed on this three-view series. The lower pelvic structures are not included in the field of view. Electronically  Signed   By: David  [place] M.D.   On: 04/20/2016 16:16    Microbiology: Recent Results (from the past 240 hour(s))  Urine culture     Status: None   Collection Time: 04/20/16  3:13 PM  Result Value Ref Range Status   Colony Count >=100,000 COLONIES/ML  Final   Organism ID, Bacteria Multiple bacterial morphotypes present, none  Final   Organism ID, Bacteria predominant. Suggest appropriate recollection if   Final   Organism ID, Bacteria clinically indicated.  Final  Culture, Urine     Status: Abnormal   Collection Time: 04/23/16  4:03 PM  Result Value Ref Range Status   Specimen Description URINE, CLEAN CATCH  Final   Special Requests NONE  Final   Culture >=100,000 COLONIES/mL KLEBSIELLA PNEUMONIAE (A)  Final   Report Status 04/26/2016 FINAL  Final   Organism ID, Bacteria KLEBSIELLA PNEUMONIAE (A)  Final      Susceptibility   Klebsiella pneumoniae - MIC*    AMPICILLIN 16 RESISTANT Resistant     CEFAZOLIN <=4 SENSITIVE Sensitive     CEFTRIAXONE <=1 SENSITIVE Sensitive     CIPROFLOXACIN <=0.25 SENSITIVE Sensitive     GENTAMICIN <=1 SENSITIVE Sensitive     IMIPENEM <=0.25 SENSITIVE Sensitive     NITROFURANTOIN <=16 SENSITIVE Sensitive     TRIMETH/SULFA <=20 SENSITIVE Sensitive     AMPICILLIN/SULBACTAM 4 SENSITIVE Sensitive     PIP/TAZO <=4 SENSITIVE Sensitive     * >=100,000 COLONIES/mL KLEBSIELLA PNEUMONIAE  Culture, blood (routine x 2) Call MD if unable to obtain prior to antibiotics being given     Status: None (Preliminary result)   Collection Time: 04/23/16  5:50 PM  Result Value  Ref Range Status   Specimen Description BLOOD RIGHT HAND  Final   Special Requests BOTTLES DRAWN AEROBIC ONLY 5CC  Final   Culture   Final    NO GROWTH 2 DAYS Performed at [facility]    Report Status PENDING  Incomplete  Culture, blood (routine x 2) Call MD if unable to obtain prior to antibiotics being given     Status: None (Preliminary result)   Collection Time: 04/23/16  5:51 PM  Result Value Ref Range Status   Specimen Description BLOOD RIGHT WRIST  Final   Special Requests BOTTLES DRAWN AEROBIC AND ANAEROBIC 5CC  Final   Culture   Final    NO GROWTH 2 DAYS Performed at [facility]    Report Status PENDING  Incomplete  MRSA PCR Screening     Status: None   Collection Time: 04/23/16  6:42 PM  Result Value Ref Range Status   MRSA by PCR NEGATIVE NEGATIVE Final    Comment:        The GeneXpert MRSA Assay (FDA approved for NASAL specimens only), is one component of a comprehensive MRSA colonization surveillance program. It is not intended to diagnose MRSA infection nor to guide or monitor treatment for MRSA infections.      Labs: Basic Metabolic Panel:  Recent Labs Lab 04/20/16 1513 04/23/16 1403 04/24/16 0452 04/26/16 0307  NA 137 135 135 134*  K 4.2 4.3 4.4 5.0  CL 97 97* 100* 104  CO2 31 30 28 26   GLUCOSE 144* 99 194* 153*  BUN 19 27* 30* 28*  CREATININE 1.24* 1.20* 1.19* 0.96  CALCIUM 10.2 9.2 8.8* 8.6*  MG  --  2.3  --   --    Liver Function Tests:  Recent Labs Lab 04/20/16 1513  AST 22  ALT 13  ALKPHOS 109  BILITOT 0.5  PROT 8.3  ALBUMIN 4.4   No results for input(s): LIPASE, AMYLASE in the last 168 hours. No results for input(s): AMMONIA in the last 168 hours. CBC:  Recent Labs Lab 04/20/16 1513 04/23/16 1403 04/24/16 0452 04/26/16 0307  WBC 6.6 3.8* 4.1 9.2  NEUTROABS 4.7 2.1 3.4  --   HGB 15.4* 14.7 13.8 12.3  HCT 46.1* 44.8 41.6 37.4  MCV 87.1 90.0 89.1 88.2  PLT 176.0 PLATELET CLUMPS NOTED ON SMEAR, COUNT  APPEARS ADEQUATE 155 160   Cardiac Enzymes:  Recent Labs Lab 04/23/16 1403  TROPONINI <0.03   BNP: BNP (last 3 results)  Recent Labs  06/12/15 1648 04/23/16 1405  BNP 55.2 34.7    ProBNP (last 3 results)  Recent Labs  08/09/15 1047  PROBNP 72.0    CBG:  Recent Labs Lab 04/25/16 1158 04/25/16 1703 04/25/16 2156 04/26/16 0735 04/26/16 1111  GLUCAP 173* 119* 139* 115* 255*       Signed:  Amy Yu, PA-S [facility] 04/26/2016, 1:04 PM  Addendum  I personally evaluated patient on 04/26/2016 prior to discharge and agree with above findings. In summary [name] is a pleasant 82 year old female with a past medical history of COPD, currently residing at her assisted-living facility, admitted for cough associated with shortness of breath and wheezing. She was found to be in acute hypoxemic respiratory failure on admission and oxygen saturations in the 80s. Symptoms likely secondary to COPD exacerbation as she was treated on systemic steroids, empiric IV antibiotic therapy, neurologic treatments. She showed gradual clinical improvement. By day of discharge she was ventilating down the hallway without supplemental oxygen maintaining oxygen saturations in the low 90s. Given market clinical improvement she was discharged back to her assisted-living facility. She was given prescriptions for prednisone, axis a, duo nebs, Advair discus. Home health was also resumed prior to discharge.

## 2016-04-26 NOTE — Consult Note (Signed)
   [facility]   04/26/2016  Karleen DolphinMargaret J Kawano 11-26-1922 [id number]   Patient screened for potential [facility] services. Chart reviewed. Noted discharge plan is to return to ALF.  There are no identifiable [facility] needs at this time. Noted patient has had x 1 admission in the past 6 months. If patient's post hospital needs change, please place a [facility] Care [facility] consult. For questions please contact:  Raiford Nobletika Hall, MSN-Ed, RN,BSN [facility] Care [facility] Hospital Liaison [phone]

## 2016-04-26 NOTE — Progress Notes (Signed)
Physical Therapy Treatment Patient Details Name: Karleen DolphinMargaret J Lowery MRN: [id number] DOB: April 20, 1923 Today's Date: 04/26/2016    History of Present Illness 82 y.o. female with medical history significant of hyperthyroidism, hyperlipidemia, hypertension, spinal stenosis, gastroesophageal reflux disease, COPD, anxiety and admitted for acute respiratory failure with hypoxia.    PT Comments    Pt ambulated good distance in hallway and reported mild SOB end of ambulation however SpO2 92% on room air.  Pt reports d/c back to facility today.  Follow Up Recommendations  Home health PT     Equipment Recommendations  None recommended by PT    Recommendations for Other Services       Precautions / Restrictions Precautions Precautions: Fall Precaution Comments: monitor sats    Mobility  Bed Mobility               General bed mobility comments: Sitting EOB on arrival  Transfers Overall transfer level: Needs assistance Equipment used: Rolling walker (2 wheeled) Transfers: Sit to/from Stand Sit to Stand: Min guard         General transfer comment: verbal cues for hand placement  Ambulation/Gait Ambulation/Gait assistance: Min guard Ambulation Distance (Feet): 240 Feet Assistive device: Rolling walker (2 wheeled) Gait Pattern/deviations: Step-through pattern     General Gait Details: good pace, steady with RW, slighty dyspnea end of ambulation, SpO2 92% on room air upon returning to room   Stairs            Wheelchair Mobility    Modified Rankin (Stroke Patients Only)       Balance                                    Cognition Arousal/Alertness: Awake/alert Behavior During Therapy: WFL for tasks assessed/performed Overall Cognitive Status: Within Functional Limits for tasks assessed                      Exercises      General Comments        Pertinent Vitals/Pain Pain Assessment: No/denies pain    Home Living                      Prior Function            PT Goals (current goals can now be found in the care plan section) Progress towards PT goals: Progressing toward goals    Frequency  Min 3X/week    PT Plan Current plan remains appropriate    Co-evaluation             End of Session   Activity Tolerance: Patient tolerated treatment well Patient left: in bed;with call bell/phone within reach;with family/visitor present     Time: [id number] PT Time Calculation (min) (ACUTE ONLY): 15 min  Charges:  $Gait Training: 8-22 mins                    G Codes:      Darlisa Spruiell,KATHrine E 04/26/2016, 3:17 PM Zenovia JarredKati Azreal Stthomas, PT, DPT 04/26/2016 Pager: [phone]

## 2016-04-26 NOTE — Progress Notes (Signed)
PHARMACY NOTE -  ANTIBIOTIC RENAL DOSE ADJUSTMENT   Request received for Pharmacy to assist with antibiotic renal dose adjustment.  Patient is currently on Rocephin for Klebsiella UTI and Doxy for bronchitis. SCr 0.96, estimated CrCl 40-45 ml/min Current dosage is appropriate and need for further dosage adjustment appears unlikely at present. Will sign off at this time.  Please reconsult if a change in clinical status warrants re-evaluation of dosage.  [name], PharmD, BCPS Pager: [phone] 04/26/2016@10 :38 AM

## 2016-04-26 NOTE — Progress Notes (Signed)
Patient is set to discharge back to [facility] today. Patient & daughter at bedside, [name] made aware. Discharge packet given to RN, Charline BillsKristyn. [facility] called for transport.     Lincoln MaxinKelly Nasim Garofano, LCSW [facility] Clinical Social Worker cell #: [phone]

## 2016-04-26 NOTE — Care Management Important Message (Signed)
Important Message  Patient Details  Name: Gwendolyn DolphinMargaret J Caffee MRN: [id number] Date of Birth: [birth year]   Medicare Important Message Given:  Yes    Haskell FlirtJamison, Arlyss Weathersby 04/26/2016, 1:06 PMImportant Message  Patient Details  Name: Gwendolyn DolphinMargaret J Beman MRN: [id number] Date of Birth: [birth year]   Medicare Important Message Given:  Yes    Haskell FlirtJamison, Tyreak Reagle 04/26/2016, 1:06 PM

## 2016-04-27 ENCOUNTER — Inpatient Hospital Stay (HOSPITAL_COMMUNITY)
Admission: EM | Admit: 2016-04-27 | Discharge: 2016-05-01 | Disposition: A | Discharge status: Nursing Home (Includes ICF) | Payer: Medicare Other | Source: Home / Self Care | Attending: Internal Medicine | Admitting: Internal Medicine

## 2016-04-27 ENCOUNTER — Emergency Department (HOSPITAL_COMMUNITY): Payer: Medicare Other

## 2016-04-27 ENCOUNTER — Encounter (HOSPITAL_COMMUNITY): Payer: Self-pay | Admitting: *Deleted

## 2016-04-27 DIAGNOSIS — I4510 Unspecified right bundle-branch block: Secondary | ICD-10-CM

## 2016-04-27 DIAGNOSIS — E0590 Thyrotoxicosis, unspecified without thyrotoxic crisis or storm: Secondary | ICD-10-CM | POA: Diagnosis present

## 2016-04-27 DIAGNOSIS — R1312 Dysphagia, oropharyngeal phase: Secondary | ICD-10-CM | POA: Diagnosis present

## 2016-04-27 DIAGNOSIS — N183 Chronic kidney disease, stage 3 unspecified: Secondary | ICD-10-CM | POA: Diagnosis present

## 2016-04-27 DIAGNOSIS — Z809 Family history of malignant neoplasm, unspecified: Secondary | ICD-10-CM

## 2016-04-27 DIAGNOSIS — Z881 Allergy status to other antibiotic agents status: Secondary | ICD-10-CM

## 2016-04-27 DIAGNOSIS — J9620 Acute and chronic respiratory failure, unspecified whether with hypoxia or hypercapnia: Secondary | ICD-10-CM

## 2016-04-27 DIAGNOSIS — R7303 Prediabetes: Secondary | ICD-10-CM | POA: Diagnosis present

## 2016-04-27 DIAGNOSIS — Z7982 Long term (current) use of aspirin: Secondary | ICD-10-CM

## 2016-04-27 DIAGNOSIS — J9621 Acute and chronic respiratory failure with hypoxia: Secondary | ICD-10-CM

## 2016-04-27 DIAGNOSIS — Z9109 Other allergy status, other than to drugs and biological substances: Secondary | ICD-10-CM

## 2016-04-27 DIAGNOSIS — J690 Pneumonitis due to inhalation of food and vomit: Secondary | ICD-10-CM

## 2016-04-27 DIAGNOSIS — J449 Chronic obstructive pulmonary disease, unspecified: Secondary | ICD-10-CM | POA: Diagnosis present

## 2016-04-27 DIAGNOSIS — J189 Pneumonia, unspecified organism: Secondary | ICD-10-CM | POA: Diagnosis present

## 2016-04-27 DIAGNOSIS — R350 Frequency of micturition: Secondary | ICD-10-CM

## 2016-04-27 DIAGNOSIS — Y95 Nosocomial condition: Secondary | ICD-10-CM | POA: Diagnosis present

## 2016-04-27 DIAGNOSIS — T17908A Unspecified foreign body in respiratory tract, part unspecified causing other injury, initial encounter: Secondary | ICD-10-CM

## 2016-04-27 DIAGNOSIS — J440 Chronic obstructive pulmonary disease with acute lower respiratory infection: Secondary | ICD-10-CM

## 2016-04-27 DIAGNOSIS — Z79899 Other long term (current) drug therapy: Secondary | ICD-10-CM

## 2016-04-27 DIAGNOSIS — R0982 Postnasal drip: Secondary | ICD-10-CM

## 2016-04-27 DIAGNOSIS — Z8249 Family history of ischemic heart disease and other diseases of the circulatory system: Secondary | ICD-10-CM

## 2016-04-27 DIAGNOSIS — K224 Dyskinesia of esophagus: Secondary | ICD-10-CM

## 2016-04-27 DIAGNOSIS — J9811 Atelectasis: Secondary | ICD-10-CM | POA: Diagnosis present

## 2016-04-27 DIAGNOSIS — Z91040 Latex allergy status: Secondary | ICD-10-CM

## 2016-04-27 DIAGNOSIS — Z888 Allergy status to other drugs, medicaments and biological substances status: Secondary | ICD-10-CM

## 2016-04-27 DIAGNOSIS — Z7951 Long term (current) use of inhaled steroids: Secondary | ICD-10-CM

## 2016-04-27 DIAGNOSIS — I10 Essential (primary) hypertension: Secondary | ICD-10-CM | POA: Diagnosis present

## 2016-04-27 DIAGNOSIS — K219 Gastro-esophageal reflux disease without esophagitis: Secondary | ICD-10-CM

## 2016-04-27 DIAGNOSIS — I129 Hypertensive chronic kidney disease with stage 1 through stage 4 chronic kidney disease, or unspecified chronic kidney disease: Secondary | ICD-10-CM

## 2016-04-27 DIAGNOSIS — E0520 Thyrotoxicosis with toxic multinodular goiter without thyrotoxic crisis or storm: Secondary | ICD-10-CM | POA: Diagnosis present

## 2016-04-27 DIAGNOSIS — E785 Hyperlipidemia, unspecified: Secondary | ICD-10-CM | POA: Diagnosis present

## 2016-04-27 DIAGNOSIS — J441 Chronic obstructive pulmonary disease with (acute) exacerbation: Secondary | ICD-10-CM

## 2016-04-27 DIAGNOSIS — J9690 Respiratory failure, unspecified, unspecified whether with hypoxia or hypercapnia: Secondary | ICD-10-CM | POA: Diagnosis present

## 2016-04-27 LAB — BRAIN NATRIURETIC PEPTIDE: B Natriuretic Peptide: 117.1 pg/mL — ABNORMAL HIGH (ref 0.0–100.0)

## 2016-04-27 LAB — BLOOD GAS, ARTERIAL
Acid-Base Excess: 1 mmol/L (ref 0.0–2.0)
Bicarbonate: 26 mEq/L — ABNORMAL HIGH (ref 20.0–24.0)
Drawn by: 295031
O2 Content: 2 L/min
O2 Saturation: 95.3 %
Patient temperature: 98.6
TCO2: 23 mmol/L (ref 0–100)
pCO2 arterial: 44.8 mmHg (ref 35.0–45.0)
pH, Arterial: 7.381 (ref 7.350–7.450)
pO2, Arterial: 79.2 mmHg — ABNORMAL LOW (ref 80.0–100.0)

## 2016-04-27 LAB — BASIC METABOLIC PANEL
Anion gap: 7 (ref 5–15)
BUN: 21 mg/dL — AB (ref 6–20)
CO2: 26 mmol/L (ref 22–32)
Calcium: 9.2 mg/dL (ref 8.9–10.3)
Chloride: 101 mmol/L (ref 101–111)
Creatinine, Ser: 0.82 mg/dL (ref 0.44–1.00)
GFR calc Af Amer: >60 mL/min (ref >60)
GLUCOSE: 101 mg/dL — AB (ref 65–99)
POTASSIUM: 3.8 mmol/L (ref 3.5–5.1)
Sodium: 134 mmol/L — ABNORMAL LOW (ref 135–145)

## 2016-04-27 LAB — CREATININE, SERUM
Creatinine, Ser: 0.81 mg/dL (ref 0.44–1.00)
GFR calc Af Amer: >60 mL/min (ref >60)
GFR calc non Af Amer: >60 mL/min (ref >60)

## 2016-04-27 LAB — URINALYSIS, ROUTINE W REFLEX MICROSCOPIC
BILIRUBIN URINE: NEGATIVE
Glucose, UA: NEGATIVE mg/dL
Hgb urine dipstick: NEGATIVE
KETONES UR: NEGATIVE mg/dL
LEUKOCYTES UA: NEGATIVE
NITRITE: NEGATIVE
PH: 7.5 (ref 5.0–8.0)
PROTEIN: NEGATIVE mg/dL
Specific Gravity, Urine: 1.01 (ref 1.005–1.030)

## 2016-04-27 LAB — CBC
HCT: 43.7 % (ref 36.0–46.0)
Hemoglobin: 14.8 g/dL (ref 12.0–15.0)
MCH: 29.6 pg (ref 26.0–34.0)
MCHC: 33.9 g/dL (ref 30.0–36.0)
MCV: 87.4 fL (ref 78.0–100.0)
Platelets: 225 10*3/uL (ref 150–400)
RBC: 5 MIL/uL (ref 3.87–5.11)
RDW: 14 % (ref 11.5–15.5)
WBC: 9.2 10*3/uL (ref 4.0–10.5)

## 2016-04-27 LAB — CBC WITH DIFFERENTIAL/PLATELET
Basophils Absolute: 0 10*3/uL (ref 0.0–0.1)
Basophils Relative: 0 %
EOS PCT: 0 %
Eosinophils Absolute: 0 10*3/uL (ref 0.0–0.7)
HCT: 43.2 % (ref 36.0–46.0)
Hemoglobin: 14.3 g/dL (ref 12.0–15.0)
LYMPHS ABS: 2 10*3/uL (ref 0.7–4.0)
LYMPHS PCT: 19 %
MCH: 29.1 pg (ref 26.0–34.0)
MCHC: 33.1 g/dL (ref 30.0–36.0)
MCV: 87.8 fL (ref 78.0–100.0)
MONO ABS: 1.4 10*3/uL — AB (ref 0.1–1.0)
MONOS PCT: 14 %
Neutro Abs: 7 10*3/uL (ref 1.7–7.7)
Neutrophils Relative %: 67 %
PLATELETS: 190 10*3/uL (ref 150–400)
RBC: 4.92 MIL/uL (ref 3.87–5.11)
RDW: 14 % (ref 11.5–15.5)
WBC: 10.4 10*3/uL (ref 4.0–10.5)

## 2016-04-27 LAB — TROPONIN I
Troponin I: <0.03 ng/mL (ref <0.031)
Troponin I: <0.03 ng/mL (ref <0.031)

## 2016-04-27 LAB — I-STAT TROPONIN, ED: Troponin i, poc: 0 ng/mL (ref 0.00–0.08)

## 2016-04-27 MED ORDER — ENOXAPARIN SODIUM 40 MG/0.4ML ~~LOC~~ SOLN
40.0000 mg | SUBCUTANEOUS | Status: DC
  Administered 2016-04-27 – 2016-04-29 (×3): 40 mg via SUBCUTANEOUS
  Filled 2016-04-27 (×3): qty 0.4

## 2016-04-27 MED ORDER — FAMOTIDINE 20 MG PO TABS
20.0000 mg | ORAL_TABLET | Freq: Every day | ORAL | Status: DC
  Administered 2016-04-27 – 2016-05-01 (×5): 20 mg via ORAL
  Filled 2016-04-27 (×5): qty 1

## 2016-04-27 MED ORDER — ADULT MULTIVITAMIN W/MINERALS CH
1.0000 | ORAL_TABLET | Freq: Every day | ORAL | Status: DC
  Administered 2016-04-27 – 2016-05-01 (×5): 1 via ORAL
  Filled 2016-04-27 (×5): qty 1

## 2016-04-27 MED ORDER — IPRATROPIUM-ALBUTEROL 0.5-2.5 (3) MG/3ML IN SOLN: 3.0000 mL | RESPIRATORY_TRACT | Status: DC | PRN

## 2016-04-27 MED ORDER — VANCOMYCIN HCL IN DEXTROSE 1-5 GM/200ML-% IV SOLN
1000.0000 mg | INTRAVENOUS | Status: AC
  Administered 2016-04-27: 1000 mg via INTRAVENOUS
  Filled 2016-04-27: qty 200

## 2016-04-27 MED ORDER — MONTELUKAST SODIUM 10 MG PO TABS
10.0000 mg | ORAL_TABLET | Freq: Every day | ORAL | Status: DC
  Administered 2016-04-27 – 2016-04-30 (×4): 10 mg via ORAL
  Filled 2016-04-27 (×4): qty 1

## 2016-04-27 MED ORDER — IPRATROPIUM-ALBUTEROL 0.5-2.5 (3) MG/3ML IN SOLN
RESPIRATORY_TRACT | Status: AC
  Administered 2016-04-27: 3 mL
  Filled 2016-04-27: qty 3

## 2016-04-27 MED ORDER — BUDESONIDE 0.25 MG/2ML IN SUSP
0.2500 mg | Freq: Two times a day (BID) | RESPIRATORY_TRACT | Status: DC
  Administered 2016-04-27 – 2016-05-01 (×8): 0.25 mg via RESPIRATORY_TRACT
  Filled 2016-04-27 (×8): qty 2

## 2016-04-27 MED ORDER — DEXTROSE 5 % IV SOLN
5.0000 mg/h | INTRAVENOUS | Status: AC
  Administered 2016-04-27: 5 mg/h via INTRAVENOUS
  Filled 2016-04-27: qty 100

## 2016-04-27 MED ORDER — VITAMIN D3 25 MCG (1000 UNIT) PO TABS
2000.0000 [IU] | ORAL_TABLET | Freq: Every day | ORAL | Status: DC
  Administered 2016-04-27 – 2016-05-01 (×5): 2000 [IU] via ORAL
  Filled 2016-04-27 (×10): qty 2

## 2016-04-27 MED ORDER — ACETAMINOPHEN 325 MG PO TABS
650.0000 mg | ORAL_TABLET | Freq: Once | ORAL | Status: AC
  Administered 2016-04-27: 650 mg via ORAL
  Filled 2016-04-27: qty 2

## 2016-04-27 MED ORDER — ASPIRIN EC 81 MG PO TBEC
81.0000 mg | DELAYED_RELEASE_TABLET | Freq: Every day | ORAL | Status: DC
  Administered 2016-04-27 – 2016-05-01 (×5): 81 mg via ORAL
  Filled 2016-04-27 (×5): qty 1

## 2016-04-27 MED ORDER — ACETAMINOPHEN 325 MG PO TABS: 650.0000 mg | ORAL_TABLET | ORAL | Status: DC | PRN

## 2016-04-27 MED ORDER — SERTRALINE HCL 50 MG PO TABS
50.0000 mg | ORAL_TABLET | Freq: Every day | ORAL | Status: DC
  Administered 2016-04-27 – 2016-04-30 (×4): 50 mg via ORAL
  Filled 2016-04-27 (×4): qty 1

## 2016-04-27 MED ORDER — POLYETHYLENE GLYCOL 3350 17 G PO PACK: 17.0000 g | PACK | Freq: Every day | ORAL | Status: DC | PRN

## 2016-04-27 MED ORDER — SODIUM CHLORIDE 0.9 % IV BOLUS (SEPSIS)
1000.0000 mL | Freq: Once | INTRAVENOUS | Status: AC
  Administered 2016-04-27: 1000 mL via INTRAVENOUS

## 2016-04-27 MED ORDER — ADULT MULTIVITAMIN W/MINERALS CH: 1.0000 | ORAL_TABLET | Freq: Every day | ORAL | Status: DC

## 2016-04-27 MED ORDER — VANCOMYCIN HCL IN DEXTROSE 750-5 MG/150ML-% IV SOLN
750.0000 mg | Freq: Two times a day (BID) | INTRAVENOUS | Status: DC
  Administered 2016-04-28 (×2): 750 mg via INTRAVENOUS
  Filled 2016-04-27 (×2): qty 150

## 2016-04-27 MED ORDER — TORSEMIDE 20 MG PO TABS
20.0000 mg | ORAL_TABLET | Freq: Every day | ORAL | Status: DC
  Administered 2016-04-27 – 2016-04-29 (×3): 20 mg via ORAL
  Filled 2016-04-27 (×4): qty 1

## 2016-04-27 MED ORDER — METHIMAZOLE 5 MG PO TABS
5.0000 mg | ORAL_TABLET | ORAL | Status: DC
Start: 2016-04-27 — End: 2016-05-01
  Administered 2016-04-27 – 2016-05-01 (×3): 5 mg via ORAL
  Filled 2016-04-27 (×3): qty 1

## 2016-04-27 MED ORDER — DEXTROSE 5 % IV SOLN
2.0000 g | INTRAVENOUS | Status: AC
  Administered 2016-04-27: 2 g via INTRAVENOUS
  Filled 2016-04-27: qty 2

## 2016-04-27 MED ORDER — AMLODIPINE BESYLATE 5 MG PO TABS
5.0000 mg | ORAL_TABLET | Freq: Every day | ORAL | Status: DC
  Administered 2016-04-27 – 2016-05-01 (×5): 5 mg via ORAL
  Filled 2016-04-27 (×5): qty 1

## 2016-04-27 MED ORDER — IPRATROPIUM-ALBUTEROL 0.5-2.5 (3) MG/3ML IN SOLN
3.0000 mL | Freq: Four times a day (QID) | RESPIRATORY_TRACT | Status: DC
  Administered 2016-04-27: 3 mL via RESPIRATORY_TRACT
  Filled 2016-04-27: qty 3

## 2016-04-27 MED ORDER — LORATADINE 10 MG PO TABS
10.0000 mg | ORAL_TABLET | Freq: Every day | ORAL | Status: DC
  Administered 2016-04-27 – 2016-05-01 (×5): 10 mg via ORAL
  Filled 2016-04-27 (×5): qty 1

## 2016-04-27 MED ORDER — ALPRAZOLAM 0.25 MG PO TABS
0.2500 mg | ORAL_TABLET | Freq: Every evening | ORAL | Status: DC | PRN
  Administered 2016-04-28 – 2016-04-30 (×3): 0.25 mg via ORAL
  Filled 2016-04-27 (×3): qty 1

## 2016-04-27 MED ORDER — GUAIFENESIN 100 MG/5ML PO SYRP
200.0000 mg | ORAL_SOLUTION | Freq: Three times a day (TID) | ORAL | Status: DC | PRN
  Filled 2016-04-27: qty 10

## 2016-04-27 MED ORDER — METHYLPREDNISOLONE SODIUM SUCC 40 MG IJ SOLR
40.0000 mg | Freq: Every day | INTRAMUSCULAR | Status: DC
  Administered 2016-04-27 – 2016-04-29 (×3): 40 mg via INTRAVENOUS
  Filled 2016-04-27 (×3): qty 1

## 2016-04-27 MED ORDER — DEXTROSE 5 % IV SOLN
1.0000 g | Freq: Three times a day (TID) | INTRAVENOUS | Status: DC
  Administered 2016-04-27 – 2016-04-28 (×2): 1 g via INTRAVENOUS
  Filled 2016-04-27 (×4): qty 1

## 2016-04-27 MED ORDER — FLUTICASONE PROPIONATE 50 MCG/ACT NA SUSP
2.0000 | Freq: Every day | NASAL | Status: DC
  Administered 2016-04-27 – 2016-05-01 (×5): 2 via NASAL
  Filled 2016-04-27: qty 16

## 2016-04-27 NOTE — ED Notes (Signed)
Inquired with EDP, Kohut if blood cultures needed to be obtained before the start of antibiotic administration. EDP, Kohut stated no need for blood cultures.

## 2016-04-27 NOTE — ED Provider Notes (Signed)
CSN: [id number]     Arrival date & time 04/27/16  4098 History   First MD Initiated Contact with Patient 04/27/16 0815     Chief Complaint  Patient presents with  . Shortness of Breath   HPI  [name] is a 82 year old female with past medical history of COPD presenting with shortness of breath and cough. Patient was hospitalized 4 days ago for a COPD exacerbation and discharged yesterday. Patient returned to her nursing facility. She reports waking at approximately 4 AM with shortness of breath. Her nursing facility gave her a DuoNeb treatment which she reports improved her symptoms but she is still experiencing shortness of breath at rest. She is also complaining of a cough that has been present over the past few days. This cough was present during her hospitalization and is not new. She denies any new symptoms since discharge from the hospital yesterday. She statse that she initially felt better yesterday but she is "feeling just as bad as before" now. She was seen by her pulmonologist approximately one week ago for her recent increasing shortness of breath and had a negative workup at that time. Daughter reports patient is not on oxygen at baseline.   Past Medical History  Diagnosis Date  . Shortness of breath   . Unspecified essential hypertension   . Right bundle branch block   . Other and unspecified hyperlipidemia   . Other abnormal glucose   . Diverticulosis of colon (without mention of hemorrhage)   . Pyelonephritis, unspecified   . Solitary cyst of breast   . Osteoarthrosis, unspecified whether generalized or localized, unspecified site   . Lumbago   . Spinal stenosis, unspecified region other than cervical   . Anemia, unspecified   . GERD (gastroesophageal reflux disease)   . Pyelonephritis   . DJD (degenerative joint disease)   . UTI (lower urinary tract infection)    Past Surgical History  Procedure Laterality Date  . Cataract extraction    . Right hip hemiarthroplasty       total  . Conversion to right thr     Family History  Problem Relation Age of Onset  . Cancer Brother   . Cancer Brother   . Heart failure Father    Social History  Substance Use Topics  . Smoking status: Never Smoker   . Smokeless tobacco: Never Used  . Alcohol Use: No   OB History    No data available     Review of Systems  All other systems reviewed and are negative.     Allergies  Azithromycin; Ciprofloxacin; Levofloxacin; Furosemide; Latex; and Other  Home Medications   Prior to Admission medications   Medication Sig Start Date End Date Taking? Authorizing Provider  acetaminophen (TYLENOL) 325 MG tablet Take 650 mg by mouth every 4 (four) hours as needed for mild pain.   Yes Historical Provider, MD  acetaZOLAMIDE (DIAMOX) 250 MG tablet TAKE (1) TABLET BY MOUTH EACH MORNING. Patient taking differently: TAKE 250 MG BY MOUTH EACH MORNING. 01/17/16  Yes Michele Mcalpine, MD  ALPRAZolam Prudy Feeler) 0.25 MG tablet Take 1 tablet (0.25 mg total) by mouth at bedtime as needed for sleep. 04/26/16  Yes Jeralyn Bennett, MD  amLODipine (NORVASC) 5 MG tablet Take 1 tablet (5 mg total) by mouth daily. 03/23/16  Yes Michele Mcalpine, MD  aspirin EC 81 MG tablet Take 81 mg by mouth daily.   Yes Historical Provider, MD  cetirizine (ZYRTEC) 10 MG tablet TAKE 1 TABLET  BY MOUTH ONCE DAILY FOR ALLERGIES. Patient taking differently: TAKE 10 MG BY MOUTH ONCE DAILY FOR ALLERGIES. 03/01/16  Yes Michele McalpineScott M Nadel, MD  Cholecalciferol (VITAMIN D3) 2000 UNITS capsule Take 1 capsule (2,000 Units total) by mouth daily. 08/16/15  Yes Michele McalpineScott M Nadel, MD  fluticasone (FLONASE) 50 MCG/ACT nasal spray SPRAY 2 SPRAYS INTO EACH NOSTRIL ONCE DAILY. 03/16/16  Yes Bevelyn NgoSarah F Groce, NP  Fluticasone-Salmeterol (ADVAIR DISKUS) 100-50 MCG/DOSE AEPB Inhale 1 puff into the lungs 2 (two) times daily. 04/26/16  Yes Jeralyn BennettEzequiel Zamora, MD  guaifenesin (ROBITUSSIN) 100 MG/5ML syrup Take 200 mg by mouth 3 (three) times daily as needed for cough.    Yes Historical Provider, MD  ipratropium-albuterol (DUONEB) 0.5-2.5 (3) MG/3ML SOLN Take 3 mLs by nebulization every 4 (four) hours as needed. 04/26/16  Yes Jeralyn BennettEzequiel Zamora, MD  methimazole (TAPAZOLE) 5 MG tablet Take 1 tablet (5 mg total) by mouth every other day. 03/01/16  Yes Carlus Pavlovristina Gherghe, MD  Multiple Vitamin (DAILY-VITE) TABS TAKE ONE TABLET BY MOUTH ONCE DAILY. 04/09/16  Yes Michele McalpineScott M Nadel, MD  ondansetron (ZOFRAN) 4 MG tablet Take 1 tablet (4 mg total) by mouth every 6 (six) hours as needed for nausea or vomiting. 04/20/16  Yes Praveen Mannam, MD  polyethylene glycol (MIRALAX / GLYCOLAX) packet Take 17 g by mouth daily as needed for mild constipation.    Yes Historical Provider, MD  ranitidine (ZANTAC) 150 MG tablet TAKE ONE TABLET BY MOUTH ONCE DAILY. Patient taking differently: TAKE 150 MG BY MOUTH ONCE DAILY. 03/30/16  Yes Michele McalpineScott M Nadel, MD  sertraline (ZOLOFT) 50 MG tablet TAKE (1) TABLET BY MOUTH AT BEDTIME. Patient taking differently: Take 50 mg by mouth at bedtime.  03/23/16  Yes Michele McalpineScott M Nadel, MD  torsemide (DEMADEX) 20 MG tablet TAKE 1/2 TABLET BY MOUTH EVERY MORNING. Patient taking differently: TAKE 20 MG BY MOUTH EVERY MORNING. 01/24/16  Yes Michele McalpineScott M Nadel, MD  PROAIR HFA 108 [phone](90 Base) MCG/ACT inhaler USE 1 TO 2 PUFFS EVERY SIX HOURS AS NEEDED. 04/27/16   Michele McalpineScott M Nadel, MD  Spacer/Aero-Holding Chambers (AEROCHAMBER PLUS WITH MASK) inhaler Use as instructed 06/12/15   Arby BarretteMarcy Pfeiffer, MD   BP 157/80 mmHg  Pulse 70  Temp(Src) 97 F (36.1 C) (Oral)  Resp 21  SpO2 97% Physical Exam  Constitutional: She is oriented to person, place, and time. She appears well-developed and well-nourished. No distress.  Nontoxic appearing  HENT:  Head: Normocephalic and atraumatic.  Mouth/Throat: Mucous membranes are dry.  Eyes: Conjunctivae are normal. Right eye exhibits no discharge. Left eye exhibits no discharge. No scleral icterus.  Neck: Normal range of motion.  Cardiovascular: Normal rate, regular  rhythm and normal heart sounds.   Pulmonary/Chest: Effort normal. No respiratory distress. She has rales.  No increased work of breathing. Patient is on 2 L nasal cannula. Rales in the right mid and lower lung fields. Intermittent coughing without sputum production during exam.  Abdominal: Soft. She exhibits no distension. There is no tenderness.  Musculoskeletal: Normal range of motion.  Neurological: She is alert and oriented to person, place, and time. Coordination normal.  Skin: Skin is warm and dry.  Psychiatric: She has a normal mood and affect. Her behavior is normal.  Nursing note and vitals reviewed.   ED Course  Procedures (including critical care time) Labs Review Labs Reviewed  CBC WITH DIFFERENTIAL/PLATELET - Abnormal; Notable for the following:    Monocytes Absolute 1.4 (*)    All other components within normal  limits  BASIC METABOLIC PANEL - Abnormal; Notable for the following:    Sodium 134 (*)    Glucose, Bld 101 (*)    BUN 21 (*)    All other components within normal limits  URINALYSIS, ROUTINE W REFLEX MICROSCOPIC (NOT AT [facility])  Rosezena Sensor, ED    Imaging Review Dg Chest 2 View  04/27/2016  CLINICAL DATA:  Cough and shortness of breath for 1 day EXAM: CHEST  2 VIEW COMPARISON:  Chest radiograph and chest CT April 23, 2016 FINDINGS: There is persistent elevation of the right hemidiaphragm with patchy infiltrate in the right base. Lungs elsewhere are clear. Heart is upper normal in size with pulmonary vascularity within normal limits. No adenopathy. There is degenerative change in the thoracic spine. IMPRESSION: Elevation of the right hemidiaphragm with infiltrate right base. Lungs elsewhere clear. Stable cardiac silhouette. Electronically Signed   By: Bretta Bang III M.D.   On: 04/27/2016 09:36   I have personally reviewed and evaluated these images and lab results as part of my medical decision-making.   EKG Interpretation   Date/Time:  Friday April 27 2016 09:31:50 EDT Ventricular Rate:  69 PR Interval:  260 QRS Duration: 132 QT Interval:  434 QTC Calculation: 465 R Axis:   -66 Text Interpretation:  Sinus rhythm Right bundle branch block First degree  A-V block Premature atrial complexes Confirmed by DELO  MD, DOUGLAS  ([id number]) on 04/27/2016 10:20:35 AM      MDM   Final diagnoses:  Healthcare-associated pneumonia   82 year old female recently hospitalized for a COPD exacerbation presenting with recurrent shortness of breath since discharge yesterday. Afebrile. Patient is not on oxygen at baseline. Noted to be mildly hypoxic in the low 90s on room air. Patient is placed on 2 L nasal cannula. Rales heard in the right mid and lower lung fields. No increased work of breathing or respiratory distress. No leukocytosis. Urinalysis negative for infection. Chest x-ray today shows bright lower infiltrate. When compared to x-ray and CT from last hospitalization, this appears to be a new infiltrate. Started on HCAP antibiotics but this could also be an aspiration event. Consult hospitalist for admission. Dr. Dartha Lodge accepting.    Rolm Gala Rusti Arizmendi, PA-C 04/27/16 1313  Geoffery Lyons, MD 04/27/16 [phone]

## 2016-04-27 NOTE — ED Notes (Addendum)
Per EMS pt from [facility] with c/o SOB that started around 4:00 this morning, was given duoneb at facility with improvement, and was sent here for eval. Pt only c/o cough and anxiety, O2 93% on RA b/p 160/90, RR 18, HR 70. (Pt discharged from hospital yesterday)

## 2016-04-27 NOTE — ED Notes (Signed)
Bed: WA03 Expected date:  Expected time:  Means of arrival:  Comments: EMS- 82 yo, SOB

## 2016-04-27 NOTE — H&P (Signed)
History and Physical  Karleen DolphinMargaret J Tursi ZOX:[id number] DOB: 1923/08/20 DOA: 04/27/2016  Referring physician: ER Physician PCP: Michele McalpineNADEL,SCOTT M, MD  Outpatient Specialists:    Patient coming from: Assisted living facility  Chief Complaint: SOB  HPI: 82 year old Caucasian female with history of respiratory failure. Patient was discharged from this hospital last night after treatment for respiratory failure. On discharge, patient continued to report shortness of breath, and was said to be restless all night. Repeat CXR done today reveals right lower lobe infiltrate. WBC is normal. O2 sat is in the 90's. Not clear if there is associated fever or chills. Patient has cogh that is intermittently productive of whitish phlegm, with associated running nose. No headache, no neck pain, no chest pain, no GI symptoms. Patient is said to have urinary frequency.  ED Course: Started on IV antibiotics  Pertinent labs: Low O2 sats. RLL infiltrate on CXR EKG: Independently reviewed.  Imaging: independently reviewed.   Review of Systems: As in HPI. 12 systems reviewed. Negative for fever, visual changes, sore throat, rash, new muscle aches, chest pain, dysuria, bleeding, n/v/abdominal pain.  Past Medical History  Diagnosis Date  . Shortness of breath   . Unspecified essential hypertension   . Right bundle branch block   . Other and unspecified hyperlipidemia   . Other abnormal glucose   . Diverticulosis of colon (without mention of hemorrhage)   . Pyelonephritis, unspecified   . Solitary cyst of breast   . Osteoarthrosis, unspecified whether generalized or localized, unspecified site   . Lumbago   . Spinal stenosis, unspecified region other than cervical   . Anemia, unspecified   . GERD (gastroesophageal reflux disease)   . Pyelonephritis   . DJD (degenerative joint disease)   . UTI (lower urinary tract infection)     Past Surgical History  Procedure Laterality Date  . Cataract extraction    .  Right hip hemiarthroplasty      total  . Conversion to right thr       reports that she has never smoked. She has never used smokeless tobacco. She reports that she does not drink alcohol or use illicit drugs.  Allergies  Allergen Reactions  . Azithromycin Shortness Of Breath  . Ciprofloxacin Other (See Comments)     hallucinations  . Levofloxacin Other (See Comments)    Insomnia, indigestion, tingling sensation in legs  . Furosemide Rash    Bullous pemphigoid  . Latex Rash  . Other Rash    EKG leads caused a rash that required steroids to clear    Family History  Problem Relation Age of Onset  . Cancer Brother   . Cancer Brother   . Heart failure Father      Prior to Admission medications   Medication Sig Start Date End Date Taking? Authorizing Provider  acetaminophen (TYLENOL) 325 MG tablet Take 650 mg by mouth every 4 (four) hours as needed for mild pain.   Yes Historical Provider, MD  acetaZOLAMIDE (DIAMOX) 250 MG tablet TAKE (1) TABLET BY MOUTH EACH MORNING. Patient taking differently: TAKE 250 MG BY MOUTH EACH MORNING. 01/17/16  Yes Michele McalpineScott M Nadel, MD  ALPRAZolam Prudy Feeler(XANAX) 0.25 MG tablet Take 1 tablet (0.25 mg total) by mouth at bedtime as needed for sleep. 04/26/16  Yes Jeralyn BennettEzequiel Zamora, MD  amLODipine (NORVASC) 5 MG tablet Take 1 tablet (5 mg total) by mouth daily. 03/23/16  Yes Michele McalpineScott M Nadel, MD  aspirin EC 81 MG tablet Take 81 mg by mouth daily.  Yes Historical Provider, MD  cetirizine (ZYRTEC) 10 MG tablet TAKE 1 TABLET BY MOUTH ONCE DAILY FOR ALLERGIES. Patient taking differently: TAKE 10 MG BY MOUTH ONCE DAILY FOR ALLERGIES. 03/01/16  Yes Michele Mcalpine, MD  Cholecalciferol (VITAMIN D3) 2000 UNITS capsule Take 1 capsule (2,000 Units total) by mouth daily. 08/16/15  Yes Michele Mcalpine, MD  fluticasone (FLONASE) 50 MCG/ACT nasal spray SPRAY 2 SPRAYS INTO EACH NOSTRIL ONCE DAILY. 03/16/16  Yes Bevelyn Ngo, NP  Fluticasone-Salmeterol (ADVAIR DISKUS) 100-50 MCG/DOSE AEPB Inhale  1 puff into the lungs 2 (two) times daily. 04/26/16  Yes Jeralyn Bennett, MD  guaifenesin (ROBITUSSIN) 100 MG/5ML syrup Take 200 mg by mouth 3 (three) times daily as needed for cough.   Yes Historical Provider, MD  ipratropium-albuterol (DUONEB) 0.5-2.5 (3) MG/3ML SOLN Take 3 mLs by nebulization every 4 (four) hours as needed. 04/26/16  Yes Jeralyn Bennett, MD  methimazole (TAPAZOLE) 5 MG tablet Take 1 tablet (5 mg total) by mouth every other day. 03/01/16  Yes Carlus Pavlov, MD  Multiple Vitamin (DAILY-VITE) TABS TAKE ONE TABLET BY MOUTH ONCE DAILY. 04/09/16  Yes Michele Mcalpine, MD  ondansetron (ZOFRAN) 4 MG tablet Take 1 tablet (4 mg total) by mouth every 6 (six) hours as needed for nausea or vomiting. 04/20/16  Yes Praveen Mannam, MD  polyethylene glycol (MIRALAX / GLYCOLAX) packet Take 17 g by mouth daily as needed for mild constipation.    Yes Historical Provider, MD  ranitidine (ZANTAC) 150 MG tablet TAKE ONE TABLET BY MOUTH ONCE DAILY. Patient taking differently: TAKE 150 MG BY MOUTH ONCE DAILY. 03/30/16  Yes Michele Mcalpine, MD  sertraline (ZOLOFT) 50 MG tablet TAKE (1) TABLET BY MOUTH AT BEDTIME. Patient taking differently: Take 50 mg by mouth at bedtime.  03/23/16  Yes Michele Mcalpine, MD  torsemide (DEMADEX) 20 MG tablet TAKE 1/2 TABLET BY MOUTH EVERY MORNING. Patient taking differently: TAKE 20 MG BY MOUTH EVERY MORNING. 01/24/16  Yes Michele Mcalpine, MD  PROAIR HFA 108 [phone] Base) MCG/ACT inhaler USE 1 TO 2 PUFFS EVERY SIX HOURS AS NEEDED. 04/27/16   Michele Mcalpine, MD  Spacer/Aero-Holding Chambers (AEROCHAMBER PLUS WITH MASK) inhaler Use as instructed 06/12/15   Arby Barrette, MD    Physical Exam: Filed Vitals:   04/27/16 0930 04/27/16 1000 04/27/16 1030 04/27/16 1200  BP: 172/79 183/99 170/83 157/80  Pulse: 69 89 73 70  Temp:      TempSrc:      Resp: 24 18 15 21   SpO2: 97% 97% 97% 97%    Constitutional:  . Appears calm and comfortable Eyes:  . PERRL and irises appear normal ENMT:   . external ears, nose appear normal Neck:  neck is supple. No JVD  Respiratory:  Decreased air entry Cardiovascular:  . RRR, no m/r/g . No LE extremity edema   Abdomen:  . Abdomen appears normal; no tenderness or masses Neurologic:  . Awake and alert. Moves all limbs.  Wt Readings from Last 3 Encounters:  04/26/16 83.099 kg (183 lb 3.2 oz)  04/20/16 81.194 kg (179 lb)  03/01/16 84.278 kg (185 lb 12.8 oz)    I have personally reviewed following labs and imaging studies  Labs on Admission:  CBC:  Recent Labs Lab 04/20/16 1513 04/23/16 1403 04/24/16 0452 04/26/16 0307 04/27/16 0924  WBC 6.6 3.8* 4.1 9.2 10.4  NEUTROABS 4.7 2.1 3.4  --  7.0  HGB 15.4* 14.7 13.8 12.3 14.3  HCT 46.1* 44.8 41.6  37.4 43.2  MCV 87.1 90.0 89.1 88.2 87.8  PLT 176.0 PLATELET CLUMPS NOTED ON SMEAR, COUNT APPEARS ADEQUATE 155 160 190   Basic Metabolic Panel:  Recent Labs Lab 04/20/16 1513 04/23/16 1403 04/24/16 0452 04/26/16 0307 04/27/16 0924  NA 137 135 135 134* 134*  K 4.2 4.3 4.4 5.0 3.8  CL 97 97* 100* 104 101  CO2 31 30 28 26 26   GLUCOSE 144* 99 194* 153* 101*  BUN 19 27* 30* 28* 21*  CREATININE 1.24* 1.20* 1.19* 0.96 0.82  CALCIUM 10.2 9.2 8.8* 8.6* 9.2  MG  --  2.3  --   --   --    Liver Function Tests:  Recent Labs Lab 04/20/16 1513  AST 22  ALT 13  ALKPHOS 109  BILITOT 0.5  PROT 8.3  ALBUMIN 4.4   No results for input(s): LIPASE, AMYLASE in the last 168 hours. No results for input(s): AMMONIA in the last 168 hours. Coagulation Profile: No results for input(s): INR, PROTIME in the last 168 hours. Cardiac Enzymes:  Recent Labs Lab 04/23/16 1403  TROPONINI <0.03   BNP (last 3 results)  Recent Labs  08/09/15 1047  PROBNP 72.0   HbA1C: No results for input(s): HGBA1C in the last 72 hours. CBG:  Recent Labs Lab 04/25/16 1158 04/25/16 1703 04/25/16 2156 04/26/16 0735 04/26/16 1111  GLUCAP 173* 119* 139* 115* 255*   Lipid Profile: No results  for input(s): CHOL, HDL, LDLCALC, TRIG, CHOLHDL, LDLDIRECT in the last 72 hours. Thyroid Function Tests: No results for input(s): TSH, T4TOTAL, FREET4, T3FREE, THYROIDAB in the last 72 hours. Anemia Panel: No results for input(s): VITAMINB12, FOLATE, FERRITIN, TIBC, IRON, RETICCTPCT in the last 72 hours. Urine analysis:    Component Value Date/Time   COLORURINE YELLOW 04/27/2016 1127   APPEARANCEUR CLEAR 04/27/2016 1127   LABSPEC 1.010 04/27/2016 1127   PHURINE 7.5 04/27/2016 1127   GLUCOSEU NEGATIVE 04/27/2016 1127   GLUCOSEU NEGATIVE 04/20/2016 1513   HGBUR NEGATIVE 04/27/2016 1127   BILIRUBINUR NEGATIVE 04/27/2016 1127   KETONESUR NEGATIVE 04/27/2016 1127   PROTEINUR NEGATIVE 04/27/2016 1127   UROBILINOGEN 0.2 04/20/2016 1513   NITRITE NEGATIVE 04/27/2016 1127   LEUKOCYTESUR NEGATIVE 04/27/2016 1127   Sepsis Labs: @LABRCNTIP (procalcitonin:4,lacticidven:4) ) Recent Results (from the past 240 hour(s))  Urine culture     Status: None   Collection Time: 04/20/16  3:13 PM  Result Value Ref Range Status   Colony Count >=100,000 COLONIES/ML  Final   Organism ID, Bacteria Multiple bacterial morphotypes present, none  Final   Organism ID, Bacteria predominant. Suggest appropriate recollection if   Final   Organism ID, Bacteria clinically indicated.  Final  Culture, Urine     Status: Abnormal   Collection Time: 04/23/16  4:03 PM  Result Value Ref Range Status   Specimen Description URINE, CLEAN CATCH  Final   Special Requests NONE  Final   Culture >=100,000 COLONIES/mL KLEBSIELLA PNEUMONIAE (A)  Final   Report Status 04/26/2016 FINAL  Final   Organism ID, Bacteria KLEBSIELLA PNEUMONIAE (A)  Final      Susceptibility   Klebsiella pneumoniae - MIC*    AMPICILLIN 16 RESISTANT Resistant     CEFAZOLIN <=4 SENSITIVE Sensitive     CEFTRIAXONE <=1 SENSITIVE Sensitive     CIPROFLOXACIN <=0.25 SENSITIVE Sensitive     GENTAMICIN <=1 SENSITIVE Sensitive     IMIPENEM <=0.25 SENSITIVE  Sensitive     NITROFURANTOIN <=16 SENSITIVE Sensitive     TRIMETH/SULFA <=20 SENSITIVE Sensitive  AMPICILLIN/SULBACTAM 4 SENSITIVE Sensitive     PIP/TAZO <=4 SENSITIVE Sensitive     * >=100,000 COLONIES/mL KLEBSIELLA PNEUMONIAE  Culture, blood (routine x 2) Call MD if unable to obtain prior to antibiotics being given     Status: None (Preliminary result)   Collection Time: 04/23/16  5:50 PM  Result Value Ref Range Status   Specimen Description BLOOD RIGHT HAND  Final   Special Requests BOTTLES DRAWN AEROBIC ONLY 5CC  Final   Culture   Final    NO GROWTH 3 DAYS Performed at [facility]    Report Status PENDING  Incomplete  Culture, blood (routine x 2) Call MD if unable to obtain prior to antibiotics being given     Status: None (Preliminary result)   Collection Time: 04/23/16  5:51 PM  Result Value Ref Range Status   Specimen Description BLOOD RIGHT WRIST  Final   Special Requests BOTTLES DRAWN AEROBIC AND ANAEROBIC 5CC  Final   Culture   Final    NO GROWTH 3 DAYS Performed at [facility]    Report Status PENDING  Incomplete  MRSA PCR Screening     Status: None   Collection Time: 04/23/16  6:42 PM  Result Value Ref Range Status   MRSA by PCR NEGATIVE NEGATIVE Final    Comment:        The GeneXpert MRSA Assay (FDA approved for NASAL specimens only), is one component of a comprehensive MRSA colonization surveillance program. It is not intended to diagnose MRSA infection nor to guide or monitor treatment for MRSA infections.       Radiological Exams on Admission: Dg Chest 2 View  04/27/2016  CLINICAL DATA:  Cough and shortness of breath for 1 day EXAM: CHEST  2 VIEW COMPARISON:  Chest radiograph and chest CT April 23, 2016 FINDINGS: There is persistent elevation of the right hemidiaphragm with patchy infiltrate in the right base. Lungs elsewhere are clear. Heart is upper normal in size with pulmonary vascularity within normal limits. No adenopathy. There is  degenerative change in the thoracic spine. IMPRESSION: Elevation of the right hemidiaphragm with infiltrate right base. Lungs elsewhere clear. Stable cardiac silhouette. Electronically Signed   By: Bretta Bang III M.D.   On: 04/27/2016 09:36    EKG: Independently reviewed.   Active Problems:   Respiratory failure (HCC)   Assessment/Plan 1. Acute on chronic respiratory failure 2. Atelectasis 3. Right lower lobe infiltrate, possible pneumonia 4. Post nasal drip   Admit patient to telemetry floor  ABG on room air  Nebulizer treatment (Duoneb and Pulmicort)  Sputum culture  IV antibiotics  Chest physiotherapy  Flonase, singulair and Loratadine  Swallowing evaluation  Cardiac BNP and cycle troponin  DVT prophylaxis: Lovenox Code Status: Full Family Communication: Daughter Disposition Plan: Undetermined   Consults called: None    Admission status: Inpatient    Time spent: 60 minutes  Berton Mount, MD  [facility] Pager #: [phone] 7PM-7AM contact night coverage as above  04/27/2016, 2:19 PM

## 2016-04-27 NOTE — Progress Notes (Signed)
Pharmacy Antibiotic Note  [name] is a 82 y.o. female recently hospitalized for COPD exacerbation, discharged yesterday, who [facility]/27/2016 with SOB and cough.  Pharmacy has been consulted for Vancomycin and Cefepime dosing for HCAP.  Plan:  Vancomycin 1000mg  IV x 1, then 750mg  IV q12h.   Plan for Vancomycin trough level at steady state. Goal trough level 15-20 mcg/mL  Cefepime 2g IV x 1, then 1g IV q8h.  Follow clinical course, renal function, culture results as available.  Follow for de-escalation of antibiotics and LOT.    Temp (24hrs), Avg:97 F (36.1 C), Min:97 F (36.1 C), Max:97 F (36.1 C)   Recent Labs Lab 04/20/16 1513 04/23/16 1403 04/24/16 0452 04/26/16 0307 04/27/16 0924  WBC 6.6 3.8* 4.1 9.2 10.4  CREATININE 1.24* 1.20* 1.19* 0.96 0.82    Estimated Creatinine Clearance: 50.5 mL/min (by C-G formula based on Cr of 0.82).    Allergies  Allergen Reactions  . Azithromycin Shortness Of Breath  . Ciprofloxacin Other (See Comments)     hallucinations  . Levofloxacin Other (See Comments)    Insomnia, indigestion, tingling sensation in legs  . Furosemide Rash    Bullous pemphigoid  . Latex Rash  . Other Rash    EKG leads caused a rash that required steroids to clear    Antimicrobials this admission: 6/9 vancomycin >>  6/9 cefepime >>   Dose adjustments this admission: ---  Microbiology results: None ordered  Thank you for allowing pharmacy to be a part of this patient's care.  Greer PickerelJigna Moneka Mcquinn, PharmD, BCPS Pager: [phone] 04/27/2016 12:23 PM

## 2016-04-27 NOTE — Progress Notes (Signed)
Pt blood pressure 167/94. MD aware.  Will continue to monitor closely.

## 2016-04-28 ENCOUNTER — Inpatient Hospital Stay (HOSPITAL_COMMUNITY): Payer: Medicare Other

## 2016-04-28 DIAGNOSIS — J189 Pneumonia, unspecified organism: Secondary | ICD-10-CM

## 2016-04-28 DIAGNOSIS — J42 Unspecified chronic bronchitis: Secondary | ICD-10-CM

## 2016-04-28 DIAGNOSIS — J9601 Acute respiratory failure with hypoxia: Principal | ICD-10-CM

## 2016-04-28 LAB — CBC
HCT: 42.7 % (ref 36.0–46.0)
Hemoglobin: 15 g/dL (ref 12.0–15.0)
MCH: 29.5 pg (ref 26.0–34.0)
MCHC: 35.1 g/dL (ref 30.0–36.0)
MCV: 83.9 fL (ref 78.0–100.0)
Platelets: 189 10*3/uL (ref 150–400)
RBC: 5.09 MIL/uL (ref 3.87–5.11)
RDW: 13.5 % (ref 11.5–15.5)
WBC: 9.2 10*3/uL (ref 4.0–10.5)

## 2016-04-28 LAB — CULTURE, BLOOD (ROUTINE X 2)
CULTURE: NO GROWTH
Culture: NO GROWTH

## 2016-04-28 LAB — BASIC METABOLIC PANEL
Anion gap: 8 (ref 5–15)
BUN: 20 mg/dL (ref 6–20)
CO2: 27 mmol/L (ref 22–32)
Calcium: 8.8 mg/dL — ABNORMAL LOW (ref 8.9–10.3)
Chloride: 96 mmol/L — ABNORMAL LOW (ref 101–111)
Creatinine, Ser: 0.98 mg/dL (ref 0.44–1.00)
GFR calc Af Amer: 57 mL/min — ABNORMAL LOW (ref >60)
GFR calc non Af Amer: 49 mL/min — ABNORMAL LOW (ref >60)
Glucose, Bld: 196 mg/dL — ABNORMAL HIGH (ref 65–99)
Potassium: 3.8 mmol/L (ref 3.5–5.1)
Sodium: 131 mmol/L — ABNORMAL LOW (ref 135–145)

## 2016-04-28 LAB — TROPONIN I: Troponin I: <0.03 ng/mL (ref <0.031)

## 2016-04-28 MED ORDER — VANCOMYCIN HCL 10 G IV SOLR
1250.0000 mg | INTRAVENOUS | Status: DC
  Administered 2016-04-28 – 2016-04-30 (×2): 1250 mg via INTRAVENOUS
  Filled 2016-04-28 (×2): qty 1250

## 2016-04-28 MED ORDER — IPRATROPIUM-ALBUTEROL 0.5-2.5 (3) MG/3ML IN SOLN
3.0000 mL | Freq: Three times a day (TID) | RESPIRATORY_TRACT | Status: DC
  Administered 2016-04-28 – 2016-05-01 (×9): 3 mL via RESPIRATORY_TRACT
  Filled 2016-04-28 (×9): qty 3

## 2016-04-28 MED ORDER — PIPERACILLIN-TAZOBACTAM 3.375 G IVPB
3.3750 g | Freq: Three times a day (TID) | INTRAVENOUS | Status: DC
  Administered 2016-04-28 – 2016-05-01 (×9): 3.375 g via INTRAVENOUS
  Filled 2016-04-28 (×8): qty 50

## 2016-04-28 MED ORDER — IPRATROPIUM-ALBUTEROL 0.5-2.5 (3) MG/3ML IN SOLN
3.0000 mL | Freq: Four times a day (QID) | RESPIRATORY_TRACT | Status: DC
  Administered 2016-04-28: 3 mL via RESPIRATORY_TRACT
  Filled 2016-04-28: qty 3

## 2016-04-28 MED ORDER — DILTIAZEM HCL 60 MG PO TABS
60.0000 mg | ORAL_TABLET | Freq: Four times a day (QID) | ORAL | Status: DC
  Administered 2016-04-28 – 2016-05-01 (×14): 60 mg via ORAL
  Filled 2016-04-28 (×14): qty 1

## 2016-04-28 NOTE — Progress Notes (Signed)
PROGRESS NOTE    SABRYN PRESLAR  ZOX:[id number] DOB: 1923/07/31 DOA: 04/27/2016 PCP: Michele Mcalpine, MD    Brief Narrative:  Mrs [name] is a 82 year old nursing home resident, who was recently discharged on 04/26/2016 at which time she was treated for a COPD exacerbation. A chest x-ray obtained on 04/23/2016 was interpreted as not having active disease by radiology. She even had a CT scan of lungs without contrast that revealed stable chronic bronchitic changes, no new infiltrates noted. During that hospitalization she was treated with systemic steroids, nebulizer treatments and antibiotic therapy. She showed clinical improvement as supplemental oxygen was titrated off and she was able to ambulate down the hallway with a walker. She was discharged back to her skilled nursing facility in stable condition. Overnight she developed worsening shortness of breath and came back to the emergency department. Chest x-ray on admission showed infiltrate at right base   Assessment & Plan:   Principal Problem:   Respiratory failure (HCC) Active Problems:   Essential hypertension   COPD (chronic obstructive pulmonary disease) (HCC)   HCAP (healthcare-associated pneumonia): Probable   CKD (chronic kidney disease), stage III   1. Acute respiratory failure with hypoxia -Evidenced by patient's presentation in respiratory distress and having oxygen requirement of 3 L nasal cannula to maintain saturations -I am highly suspicious that she may have had a aspiration event at her skilled nursing facility -Continue antibiotics, supplemental oxygen, awaiting speech pathology evaluation  2. Aspiration pneumonia versus healthcare associated pneumonia -On a recent hospitalization she was treated for COPD exacerbation. During that hospitalization she had a chest x-ray along with a CT scan of lungs.did not reveal acute infiltrate. -During that hospitalization she got better as oxygen was titrated off. She was  discharged back to her skilled nursing facility -She presented the following day with worsening shortness of breath as a chest x-ray performed in the emergency room interpreted by radiology to have a right lower lobe infiltrate. -Speech pathology has been consulted. She has been made nothing by mouth for now. Plan for modified barium study today -Meanwhile continue empiric IV antibiotic therapy with cefepime and vancomycin  3. Hypertension. -Continue amlodipine 5 mg by mouth daily  4. Hyperthyroidism -She is on methimazole 5 mg every other day -Last TSH was checked on 04/12/2016, within normal limits at 3.87.   5. Stage III chronic kidney disease -Baseline creatinine near 1.2 -Continue monitoring kidney function.  6. History of borderline diabetes -Hemoglobin A1c 6.3    DVT prophylaxis: Lovenox Code Status: Full Family Communication: I spoke with caregiver Disposition Plan: Anticipate discharge to skilled nursing facility when medically stable   Antimicrobials:   Cefepime  Vancomycin   Subjective: She is pleasantly confused, disoriented, appears baseline, on supplemental oxygen  Objective: Filed Vitals:   04/28/16 0117 04/28/16 0239 04/28/16 0240 04/28/16 0444  BP: 160/82 127/75  147/77  Pulse: 95 80 87 83  Temp:    98 F (36.7 C)  TempSrc:    Oral  Resp:    20  Height:      Weight:      SpO2:    97%    Intake/Output Summary (Last 24 hours) at 04/28/16 1200 Last data filed at 04/28/16 0700  Gross per 24 hour  Intake    482 ml  Output    420 ml  Net     62 ml   Filed Weights   04/27/16 1602  Weight: 81.7 kg (180 lb 1.9 oz)    Examination:  General exam: Appears calm and comfortable, On supplemental oxygen Respiratory system: She has coarse right-sided respiratory sounds, positive rhonchi, positive crackles Cardiovascular system: S1 & S2 heard, RRR. No JVD, murmurs, rubs, gallops or clicks. No pedal edema. Gastrointestinal system: Abdomen is  nondistended, soft and nontender. No organomegaly or masses felt. Normal bowel sounds heard. Central nervous system: Alert and oriented. No focal neurological deficits. Extremities: Symmetric 5 x 5 power. Skin: No rashes, lesions or ulcers Psychiatry: Pleasantly confused, disoriented.     Data Reviewed: I have personally reviewed following labs and imaging studies  CBC:  Recent Labs Lab 04/23/16 1403 04/24/16 0452 04/26/16 0307 04/27/16 0924 04/27/16 1427 04/28/16 0153  WBC 3.8* 4.1 9.2 10.4 9.2 9.2  NEUTROABS 2.1 3.4  --  7.0  --   --   HGB 14.7 13.8 12.3 14.3 14.8 15.0  HCT 44.8 41.6 37.4 43.2 43.7 42.7  MCV 90.0 89.1 88.2 87.8 87.4 83.9  PLT PLATELET CLUMPS NOTED ON SMEAR, COUNT APPEARS ADEQUATE 155 160 190 225 189   Basic Metabolic Panel:  Recent Labs Lab 04/23/16 1403 04/24/16 0452 04/26/16 0307 04/27/16 0924 04/27/16 1427 04/28/16 0153  NA 135 135 134* 134*  --  131*  K 4.3 4.4 5.0 3.8  --  3.8  CL 97* 100* 104 101  --  96*  CO2 30 28 26 26   --  27  GLUCOSE 99 194* 153* 101*  --  196*  BUN 27* 30* 28* 21*  --  20  CREATININE 1.20* 1.19* 0.96 0.82 0.81 0.98  CALCIUM 9.2 8.8* 8.6* 9.2  --  8.8*  MG 2.3  --   --   --   --   --    GFR: Estimated Creatinine Clearance: 41.9 mL/min (by C-G formula based on Cr of 0.98). Liver Function Tests: No results for input(s): AST, ALT, ALKPHOS, BILITOT, PROT, ALBUMIN in the last 168 hours. No results for input(s): LIPASE, AMYLASE in the last 168 hours. No results for input(s): AMMONIA in the last 168 hours. Coagulation Profile: No results for input(s): INR, PROTIME in the last 168 hours. Cardiac Enzymes:  Recent Labs Lab 04/23/16 1403 04/27/16 1427 04/27/16 2027 04/28/16 0153  TROPONINI <0.03 <0.03 <0.03 <0.03   BNP (last 3 results)  Recent Labs  08/09/15 1047  PROBNP 72.0   HbA1C: No results for input(s): HGBA1C in the last 72 hours. CBG:  Recent Labs Lab 04/25/16 1158 04/25/16 1703 04/25/16 2156  04/26/16 0735 04/26/16 1111  GLUCAP 173* 119* 139* 115* 255*   Lipid Profile: No results for input(s): CHOL, HDL, LDLCALC, TRIG, CHOLHDL, LDLDIRECT in the last 72 hours. Thyroid Function Tests: No results for input(s): TSH, T4TOTAL, FREET4, T3FREE, THYROIDAB in the last 72 hours. Anemia Panel: No results for input(s): VITAMINB12, FOLATE, FERRITIN, TIBC, IRON, RETICCTPCT in the last 72 hours. Sepsis Labs: No results for input(s): PROCALCITON, LATICACIDVEN in the last 168 hours.  Recent Results (from the past 240 hour(s))  Urine culture     Status: None   Collection Time: 04/20/16  3:13 PM  Result Value Ref Range Status   Colony Count >=100,000 COLONIES/ML  Final   Organism ID, Bacteria Multiple bacterial morphotypes present, none  Final   Organism ID, Bacteria predominant. Suggest appropriate recollection if   Final   Organism ID, Bacteria clinically indicated.  Final  Culture, Urine     Status: Abnormal   Collection Time: 04/23/16  4:03 PM  Result Value Ref Range Status   Specimen Description URINE, CLEAN  CATCH  Final   Special Requests NONE  Final   Culture >=100,000 COLONIES/mL KLEBSIELLA PNEUMONIAE (A)  Final   Report Status 04/26/2016 FINAL  Final   Organism ID, Bacteria KLEBSIELLA PNEUMONIAE (A)  Final      Susceptibility   Klebsiella pneumoniae - MIC*    AMPICILLIN 16 RESISTANT Resistant     CEFAZOLIN <=4 SENSITIVE Sensitive     CEFTRIAXONE <=1 SENSITIVE Sensitive     CIPROFLOXACIN <=0.25 SENSITIVE Sensitive     GENTAMICIN <=1 SENSITIVE Sensitive     IMIPENEM <=0.25 SENSITIVE Sensitive     NITROFURANTOIN <=16 SENSITIVE Sensitive     TRIMETH/SULFA <=20 SENSITIVE Sensitive     AMPICILLIN/SULBACTAM 4 SENSITIVE Sensitive     PIP/TAZO <=4 SENSITIVE Sensitive     * >=100,000 COLONIES/mL KLEBSIELLA PNEUMONIAE  Culture, blood (routine x 2) Call MD if unable to obtain prior to antibiotics being given     Status: None (Preliminary result)   Collection Time: 04/23/16  5:50 PM    Result Value Ref Range Status   Specimen Description BLOOD RIGHT HAND  Final   Special Requests BOTTLES DRAWN AEROBIC ONLY 5CC  Final   Culture   Final    NO GROWTH 4 DAYS Performed at [facility]    Report Status PENDING  Incomplete  Culture, blood (routine x 2) Call MD if unable to obtain prior to antibiotics being given     Status: None (Preliminary result)   Collection Time: 04/23/16  5:51 PM  Result Value Ref Range Status   Specimen Description BLOOD RIGHT WRIST  Final   Special Requests BOTTLES DRAWN AEROBIC AND ANAEROBIC 5CC  Final   Culture   Final    NO GROWTH 4 DAYS Performed at [facility]    Report Status PENDING  Incomplete  MRSA PCR Screening     Status: None   Collection Time: 04/23/16  6:42 PM  Result Value Ref Range Status   MRSA by PCR NEGATIVE NEGATIVE Final    Comment:        The GeneXpert MRSA Assay (FDA approved for NASAL specimens only), is one component of a comprehensive MRSA colonization surveillance program. It is not intended to diagnose MRSA infection nor to guide or monitor treatment for MRSA infections.          Radiology Studies: Dg Chest 2 View  04/27/2016  CLINICAL DATA:  Cough and shortness of breath for 1 day EXAM: CHEST  2 VIEW COMPARISON:  Chest radiograph and chest CT April 23, 2016 FINDINGS: There is persistent elevation of the right hemidiaphragm with patchy infiltrate in the right base. Lungs elsewhere are clear. Heart is upper normal in size with pulmonary vascularity within normal limits. No adenopathy. There is degenerative change in the thoracic spine. IMPRESSION: Elevation of the right hemidiaphragm with infiltrate right base. Lungs elsewhere clear. Stable cardiac silhouette. Electronically Signed   By: Bretta Bang III M.D.   On: 04/27/2016 09:36        Scheduled Meds: . amLODipine  5 mg Oral Daily  . aspirin EC  81 mg Oral Daily  . budesonide (PULMICORT) nebulizer solution  0.25 mg Nebulization BID   . ceFEPime (MAXIPIME) IV  1 g Intravenous Q8H  . cholecalciferol  2,000 Units Oral Daily  . diltiazem  60 mg Oral Q6H  . enoxaparin (LOVENOX) injection  40 mg Subcutaneous Q24H  . famotidine  20 mg Oral Daily  . fluticasone  2 spray Each Nare Daily  .  ipratropium-albuterol  3 mL Nebulization TID  . loratadine  10 mg Oral Daily  . methimazole  5 mg Oral QODAY  . methylPREDNISolone (SOLU-MEDROL) injection  40 mg Intravenous Daily  . montelukast  10 mg Oral QHS  . multivitamin with minerals  1 tablet Oral Daily  . sertraline  50 mg Oral QHS  . torsemide  20 mg Oral Daily  . vancomycin  750 mg Intravenous Q12H   Continuous Infusions:    LOS: 1 day    Time spent: 35 min    Jeralyn Bennett, MD [facility] Pager [phone]  If 7PM-7AM, please contact night-coverage [email or web address] Password [email or web address] 04/28/2016, 12:00 PM

## 2016-04-28 NOTE — NC FL2 (Signed)
MEDICAID FL2 LEVEL OF CARE SCREENING TOOL     IDENTIFICATION  Patient Name: [name] Birthdate: [birth year] Sex: female Admission Date (Current Location): 04/27/2016  [facility] and [place] Number:  Producer, television/film/video and Address:  [facility],  [address] [place] [address]      Provider Number: [id number]  Attending Physician Name and Address:  Jeralyn Bennett, MD  Relative Name and Phone Number:       Current Level of Care: Hospital Recommended Level of Care:   Prior Approval Number:    Date Approved/Denied:   PASRR Number:    Discharge Plan: Other (Comment)    Current Diagnoses: Patient Active Problem List   Diagnosis Date Noted  . Respiratory failure (HCC) 04/27/2016  . Pressure ulcer 04/24/2016  . Hypoxia   . Acute respiratory failure with hypoxia (HCC) 04/23/2016  . HCAP (healthcare-associated pneumonia): Probable 04/23/2016  . COPD exacerbation (HCC) 04/23/2016  . Dehydration 04/23/2016  . CKD (chronic kidney disease), stage III 04/23/2016  . Headache   . Allergic rhinitis 01/11/2016  . Thyrotoxicosis 12/07/2015  . Fatigue 2020-06-1116  . Insomnia 2020-06-1116  . Choking sensation 12/09/2014  . Rash 12/09/2014  . COPD (chronic obstructive pulmonary disease) (HCC) 11/17/2014  . Chronic venous insufficiency 04/14/2014  . Abnormality of gait 08/17/2013  . Acute URI 07/29/2012  . GERD (gastroesophageal reflux disease) 09/12/2011  . Neck pain 06/18/2011  . Anxiety 05/14/2011  . Acute bronchitis 05/02/2011  . ONYCHOMYCOSIS 12/13/2008  . UTI 12/13/2008  . Bullous pemphigoid 12/13/2008  . EDEMA 10/21/2008  . Diverticulosis of large intestine 12/01/2007  . Constipation 12/01/2007  . PYELONEPHRITIS 12/01/2007  . BREAST CYST 12/01/2007  . Osteoarthritis 12/01/2007  . DYSPNEA 12/01/2007  . Spinal stenosis of lumbar region 10/28/2007  . Elevated lipids 10/27/2007  . ANEMIA 10/27/2007  . Essential hypertension 10/27/2007  .  RIGHT BUNDLE BRANCH BLOCK 10/27/2007  . LOW BACK PAIN SYNDROME 10/27/2007  . DIABETES MELLITUS, BORDERLINE 10/27/2007    Orientation RESPIRATION BLADDER Height & Weight     Self, Place  Normal Continent Weight: 180 lb 1.9 oz (81.7 kg) Height:   (172.7 cm)  BEHAVIORAL SYMPTOMS/MOOD NEUROLOGICAL BOWEL NUTRITION STATUS      Continent Diet (Dysphasia 3; thin liquid)  AMBULATORY STATUS COMMUNICATION OF NEEDS Skin   Limited Assist Verbally PU Stage and Appropriate Care (PressureUlcer06/05/17StageI-Intactskinwithnon-blanchablerednessofalocalizedareausuallyoverabonyprominence.rednesswithhealingscars (right/left buttocks))                       Personal Care Assistance Level of Assistance  Bathing, Feeding, Dressing Bathing Assistance: Limited assistance Feeding assistance: Limited assistance Dressing Assistance: Limited assistance     Functional Limitations Info  Sight, Hearing, Speech Sight Info: Impaired Hearing Info: Impaired Speech Info: Adequate    SPECIAL CARE FACTORS FREQUENCY  OT (By licensed OT), PT (By licensed PT)                    Contractures      Additional Factors Info  Code Status, Allergies, Psychotropic, Insulin Sliding Scale Code Status Info: Fullcode Allergies Info: Allergies:  Azithromycin, Ciprofloxacin, Levofloxacin, Furosemide, Latex, Other             Discharge Medications: Current Discharge Medication List    START taking these medications   Details  PROAIR HFA 108 (90 Base) MCG/ACT inhaler USE 1 TO 2 PUFFS EVERY SIX HOURS AS NEEDED. Qty: 8.5 g, Refills: 0      CONTINUE these medications which  have NOT CHANGED   Details  acetaminophen (TYLENOL) 325 MG tablet Take 650 mg by mouth every 4 (four) hours as needed for mild pain.    acetaZOLAMIDE (DIAMOX) 250 MG tablet TAKE (1) TABLET BY MOUTH EACH MORNING. Qty: 30 tablet, Refills: 5    ALPRAZolam (XANAX) 0.25 MG tablet Take 1 tablet  (0.25 mg total) by mouth at bedtime as needed for sleep. Qty: 10 tablet, Refills: 0    amLODipine (NORVASC) 5 MG tablet Take 1 tablet (5 mg total) by mouth daily. Qty: 30 tablet, Refills: 6    aspirin EC 81 MG tablet Take 81 mg by mouth daily.    cetirizine (ZYRTEC) 10 MG tablet TAKE 1 TABLET BY MOUTH ONCE DAILY FOR ALLERGIES. Qty: 30 tablet, Refills: 5    Cholecalciferol (VITAMIN D3) 2000 UNITS capsule Take 1 capsule (2,000 Units total) by mouth daily. Qty: 30 capsule, Refills: 6    fluticasone (FLONASE) 50 MCG/ACT nasal spray SPRAY 2 SPRAYS INTO EACH NOSTRIL ONCE DAILY. Qty: 16 g, Refills: 5    Fluticasone-Salmeterol (ADVAIR DISKUS) 100-50 MCG/DOSE AEPB Inhale 1 puff into the lungs 2 (two) times daily. Qty: 60 each, Refills: 5    guaifenesin (ROBITUSSIN) 100 MG/5ML syrup Take 200 mg by mouth 3 (three) times daily as needed for cough.    ipratropium-albuterol (DUONEB) 0.5-2.5 (3) MG/3ML SOLN Take 3 mLs by nebulization every 4 (four) hours as needed. Qty: 360 mL, Refills: 0    methimazole (TAPAZOLE) 5 MG tablet Take 1 tablet (5 mg total) by mouth every other day. Qty: 30 tablet, Refills: 1    Multiple Vitamin (DAILY-VITE) TABS TAKE ONE TABLET BY MOUTH ONCE DAILY. Qty: 30 tablet, Refills: 11    ondansetron (ZOFRAN) 4 MG tablet Take 1 tablet (4 mg total) by mouth every 6 (six) hours as needed for nausea or vomiting. Qty: 15 tablet, Refills: 0    polyethylene glycol (MIRALAX / GLYCOLAX) packet Take 17 g by mouth daily as needed for mild constipation.     ranitidine (ZANTAC) 150 MG tablet TAKE ONE TABLET BY MOUTH ONCE DAILY. Qty: 30 tablet, Refills: 0    sertraline (ZOLOFT) 50 MG tablet TAKE (1) TABLET BY MOUTH AT BEDTIME. Qty: 30 tablet, Refills: 5    torsemide (DEMADEX) 20 MG tablet TAKE 1/2 TABLET BY MOUTH EVERY MORNING. Qty: 15 tablet, Refills: 11    Spacer/Aero-Holding Chambers (AEROCHAMBER PLUS WITH MASK) inhaler Use as  instructed Qty: 1 each, Refills: 2             Relevant Imaging Results:  Relevant Lab Results:   Additional Information SS#:[id number]  Arlyss RepressHarrison, Skyeler Scalese F, [place]

## 2016-04-28 NOTE — Progress Notes (Signed)
CSW familiar with patient as she just discharged back to [facility] on Thursday, 6/8 and readmitted back to [facility] 6/9. CSW met with patient's daughter, [name] at bedside to confirm that they hope she will be able to return to [facility] when she is released this time but incase they need to consider SNF - patient had been to [facility] in the past. Patient's daughter does not think she would participate with therapy, but would like MD input re: discharge.    Raynaldo Opitz, [facility] Clinical Social Worker cell #: [phone]

## 2016-04-28 NOTE — Progress Notes (Signed)
Telemetry called and reported pt had an 8 beat run of v-tach.  Strip saved in chart.  Pt asymptomatic. MD made aware.

## 2016-04-28 NOTE — Evaluation (Signed)
Clinical/Bedside Swallow Evaluation Patient Details  Name: [name] MRN: [id number] Date of Birth: 03/18/1923  Today's Date: 04/28/2016 Time: SLP Start Time (ACUTE ONLY): [id number] SLP Stop Time (ACUTE ONLY): 0940 SLP Time Calculation (min) (ACUTE ONLY): 15 min  Past Medical History:  Past Medical History  Diagnosis Date  . Shortness of breath   . Unspecified essential hypertension   . Right bundle branch block   . Other and unspecified hyperlipidemia   . Other abnormal glucose   . Diverticulosis of colon (without mention of hemorrhage)   . Pyelonephritis, unspecified   . Solitary cyst of breast   . Osteoarthrosis, unspecified whether generalized or localized, unspecified site   . Lumbago   . Spinal stenosis, unspecified region other than cervical   . Anemia, unspecified   . GERD (gastroesophageal reflux disease)   . Pyelonephritis   . DJD (degenerative joint disease)   . UTI (lower urinary tract infection)    Past Surgical History:  Past Surgical History  Procedure Laterality Date  . Cataract extraction    . Right hip hemiarthroplasty      total  . Conversion to right thr     HPI:  Patient is a 82 y.o. female who was readmitted after discharge on 6/9 after treatment for respiratory failure. Repeat CXR on 6/10 revealed right lower lobe infiltrate. PMH: GERD, SOB, spinal stenosis, COPD.   Assessment / Plan / Recommendation Clinical Impression  Patient exhibits with a mild oral dysphagia, which is impacted by loose fitting upper dentures. Patient exhibited a delayed cough, however unable to determine if this was caused by PO's or related to her PNA. SLP and MD both in agreement to perform MBS in order to r/o silent aspiration.    Aspiration Risk  Mild aspiration risk    Diet Recommendation NPO        Other  Recommendations Recommended Consults: Consider GI evaluation   Follow up Recommendations  None    Frequency and Duration min 2x/week  1 week        Prognosis Prognosis for Safe Diet Advancement: Good      Swallow Study   General Date of Onset: 04/28/16 HPI: Patient is a 82 y.o. female who was readmitted after discharge on 6/9 after treatment for respiratory failure. Repeat CXR on 6/10 revealed right lower lobe infiltrate. PMH: GERD, SOB, spinal stenosis, COPD. Type of Study: Bedside Swallow Evaluation Previous Swallow Assessment: BSE  Diet Prior to this Study: NPO Temperature Spikes Noted: No Respiratory Status: Nasal cannula History of Recent Intubation: No Behavior/Cognition: Alert;Cooperative;Pleasant mood Oral Cavity Assessment: Within Functional Limits Oral Care Completed by SLP: No Oral Cavity - Dentition: Dentures, top;Dentures, bottom Vision: Functional for self-feeding Self-Feeding Abilities: Able to feed self Patient Positioning: Upright in bed Baseline Vocal Quality: Normal Volitional Cough: Strong Volitional Swallow: Able to elicit    Oral/Motor/Sensory Function Overall Oral Motor/Sensory Function: Within functional limits   Ice Chips     Thin Liquid Thin Liquid: Impaired Pharyngeal  Phase Impairments: Cough - Delayed    Nectar Thick     Honey Thick     Puree Puree: Within functional limits   Solid   GO   Solid: Impaired Oral Phase Impairments: Impaired mastication Oral Phase Functional Implications: Prolonged oral transit;Impaired mastication        [name], [name] 04/28/2016,2:47 PM    Gwendolyn NevinJohn T. Juliene Kirsh, MA, CCC-SLP 04/28/2016 2:47 PM

## 2016-04-28 NOTE — Progress Notes (Signed)
Patient was noted to be in A.fib around 2100 confirmed by  EKG. NP on call made aware and order for Cardizem drip received. Patient later converted back to sinus rhythm after infusion. Resting quietly in bed at this time in no acute episode, call bell within reach.

## 2016-04-28 NOTE — Progress Notes (Signed)
Pharmacy Antibiotic Note  [name] is a 82 y.o. female recently hospitalized for COPD exacerbation, discharged yesterday, who [facility] from nursing facility on 04/27/2016 with SOB and cough.  Pharmacy has been consulted for Vancomycin and Cefepime dosing for HCAP.  Today, 04/28/2016: Day 2 Vancomycin, cefepime Noted concern for aspiration, cefepime changed to Zosyn. SCr increased to 0.98 with CrCl ~ 42 ml/min  Plan: Zosyn 3.375g IV Q8H infused over 4hrs.  Decrease to Vancomycin 1250mg  IV q12h.  Measure Vanc trough at steady state. Follow up renal fxn, culture results, and clinical course.   Height: 5\' 8"  (172.7 cm) Weight: 180 lb 1.9 oz (81.7 kg) IBW/kg (Calculated) : 63.9  Temp (24hrs), Avg:98.7 F (37.1 C), Min:98 F (36.7 C), Max:100.2 F (37.9 C)   Recent Labs Lab 04/24/16 0452 04/26/16 0307 04/27/16 0924 04/27/16 1427 04/28/16 0153  WBC 4.1 9.2 10.4 9.2 9.2  CREATININE 1.19* 0.96 0.82 0.81 0.98    Estimated Creatinine Clearance: 41.9 mL/min (by C-G formula based on Cr of 0.98).    Allergies  Allergen Reactions  . Azithromycin Shortness Of Breath  . Ciprofloxacin Other (See Comments)     hallucinations  . Levofloxacin Other (See Comments)    Insomnia, indigestion, tingling sensation in legs  . Furosemide Rash    Bullous pemphigoid  . Latex Rash  . Other Rash    EKG leads caused a rash that required steroids to clear    Antimicrobials this admission: 6/9 vancomycin >>  6/9 cefepime >> 6/10 6/10 zosyn >>  Dose adjustments this admission: 6/10 Vancomycin empirically reduced for age and rising SCr (baseline higher than current SCr).  Microbiology results: Previous admission 6/5 UCx: >100k Klebsiella (pan-sens except ampicillin) 6/5 MRSA PCR: negative 6/5 BCx: ngtd 6/6 Strep pneumo Ag: neg 6/6 Leg Ag: neg  Current admission 6/9 UCx: sent  Thank you for allowing pharmacy to be a part of this patient's care.  Lynann Beaverhristine Vonceil [place]  PharmD, BCPS Pager [phone] 04/28/2016 1:16 PM

## 2016-04-29 LAB — BASIC METABOLIC PANEL
ANION GAP: 8 (ref 5–15)
BUN: 27 mg/dL — ABNORMAL HIGH (ref 6–20)
CALCIUM: 8.9 mg/dL (ref 8.9–10.3)
CO2: 26 mmol/L (ref 22–32)
Chloride: 97 mmol/L — ABNORMAL LOW (ref 101–111)
Creatinine, Ser: 1.21 mg/dL — ABNORMAL HIGH (ref 0.44–1.00)
GFR calc Af Amer: 44 mL/min — ABNORMAL LOW (ref >60)
GFR, EST NON AFRICAN AMERICAN: 38 mL/min — AB (ref >60)
GLUCOSE: 139 mg/dL — AB (ref 65–99)
Potassium: 4.4 mmol/L (ref 3.5–5.1)
SODIUM: 131 mmol/L — AB (ref 135–145)

## 2016-04-29 LAB — URINE CULTURE: Culture: <10000 — AB

## 2016-04-29 LAB — CBC
HCT: 43.1 % (ref 36.0–46.0)
HEMOGLOBIN: 15.2 g/dL — AB (ref 12.0–15.0)
MCH: 29.9 pg (ref 26.0–34.0)
MCHC: 35.3 g/dL (ref 30.0–36.0)
MCV: 84.7 fL (ref 78.0–100.0)
Platelets: 218 10*3/uL (ref 150–400)
RBC: 5.09 MIL/uL (ref 3.87–5.11)
RDW: 13.6 % (ref 11.5–15.5)
WBC: 10.9 10*3/uL — AB (ref 4.0–10.5)

## 2016-04-29 MED ORDER — PREDNISONE 20 MG PO TABS
40.0000 mg | ORAL_TABLET | Freq: Every day | ORAL | Status: DC
  Filled 2016-04-29: qty 2

## 2016-04-29 MED ORDER — ALUM & MAG HYDROXIDE-SIMETH 200-200-20 MG/5ML PO SUSP
15.0000 mL | Freq: Four times a day (QID) | ORAL | Status: DC | PRN
  Administered 2016-04-29: 15 mL via ORAL
  Filled 2016-04-29: qty 30

## 2016-04-29 NOTE — Progress Notes (Signed)
PROGRESS NOTE    [name]  ZOX:[id number] DOB: 1923-03-18 DOA: 04/27/2016 PCP: Michele Mcalpine, MD    Brief Narrative:  [name] is a 82 year old nursing home resident, who was recently discharged on 04/26/2016 at which time she was treated for a COPD exacerbation. A chest x-ray obtained on 04/23/2016 was interpreted as not having active disease by radiology. She even had a CT scan of lungs without contrast that revealed stable chronic bronchitic changes, no new infiltrates noted. During that hospitalization she was treated with systemic steroids, nebulizer treatments and antibiotic therapy. She showed clinical improvement as supplemental oxygen was titrated off and she was able to ambulate down the hallway with a walker. She was discharged back to her skilled nursing facility in stable condition. Overnight she developed worsening shortness of breath and came back to the emergency department. Chest x-ray on admission showed infiltrate at right base   Assessment & Plan:   Principal Problem:   Respiratory failure (HCC) Active Problems:   Essential hypertension   COPD (chronic obstructive pulmonary disease) (HCC)   HCAP (healthcare-associated pneumonia): Probable   CKD (chronic kidney disease), stage III   1. Acute respiratory failure with hypoxia -Evidenced by patient's presentation in respiratory distress and having oxygen requirement of 3 L nasal cannula to maintain saturations -I am highly suspicious that she may have had a aspiration event at her skilled nursing facility -Continue antibiotics, supplemental oxygen -Modified barium study performed on 04/28/2016 showing evidence of mild oropharyngeal dysphagia    2. Aspiration pneumonia versus healthcare associated pneumonia -On a recent hospitalization she was treated for COPD exacerbation. During that hospitalization she had a chest x-ray along with a CT scan of lungs.did not reveal acute infiltrate. -During that hospitalization  she got better as oxygen was titrated off. She was discharged back to her skilled nursing facility -She presented the following day with worsening shortness of breath as a chest x-ray performed in the emergency room interpreted by radiology to have a right lower lobe infiltrate. -Modified barium study performed on 04/28/2016 showing evidence of mild oropharyngeal dysphagia, speech recommending dysphagia 3 diet.  -Plan to repeat CXR in am   -Meanwhile continue empiric IV antibiotic therapy with cefepime and vancomycin  3. Hypertension. -Continue amlodipine 5 mg by mouth daily  4. Hyperthyroidism -She is on methimazole 5 mg every other day -Last TSH was checked on 04/12/2016, within normal limits at 3.87.   5. Stage III chronic kidney disease -Baseline creatinine near 1.2 -Continue monitoring kidney function.  6. History of borderline diabetes -Hemoglobin A1c 6.3   7.  COPD exacerbation -She was recently treated for COPD exacerbation  -Will change IV solumedrol to prednisone at 40 mg PO q daily  DVT prophylaxis: Lovenox Code Status: Full Family Communication: I spoke with caregiver, I tried to call her daughter [name] at [phone] Disposition Plan: Anticipate discharge to skilled nursing facility when medically stable  Antimicrobials:   Cefepime  Vancomycin  Subjective: She is pleasantly confused, disoriented, appears baseline, on supplemental oxygen She looks better today, wants to go home  Objective: Filed Vitals:   04/29/16 0549 04/29/16 0845 04/29/16 0847 04/29/16 0853  BP: 141/57     Pulse: 60     Temp: 97.8 F (36.6 C)     TempSrc: Oral     Resp: 16     Height:      Weight:      SpO2: 99% 88% 90% 92%    Intake/Output Summary (Last 24 hours) at 04/29/16  1208 Last data filed at 04/29/16 1000  Gross per 24 hour  Intake    770 ml  Output      0 ml  Net    770 ml   Filed Weights   04/27/16 1602  Weight: 81.7 kg (180 lb 1.9 oz)     Examination:  General exam: Appears calm and comfortable, On supplemental oxygen, she wants to go home Respiratory system: There is some improvement to lung exam, less rhonchi Cardiovascular system: S1 & S2 heard, RRR. No JVD, murmurs, rubs, gallops or clicks. No pedal edema. Gastrointestinal system: Abdomen is nondistended, soft and nontender. No organomegaly or masses felt. Normal bowel sounds heard. Central nervous system: Alert and oriented. No focal neurological deficits. Extremities: Symmetric 5 x 5 power. Skin: No rashes, lesions or ulcers Psychiatry: Pleasantly confused, disoriented.     Data Reviewed: I have personally reviewed following labs and imaging studies  CBC:  Recent Labs Lab 04/23/16 1403 04/24/16 0452 04/26/16 0307 04/27/16 0924 04/27/16 1427 04/28/16 0153 04/29/16 0512  WBC 3.8* 4.1 9.2 10.4 9.2 9.2 10.9*  NEUTROABS 2.1 3.4  --  7.0  --   --   --   HGB 14.7 13.8 12.3 14.3 14.8 15.0 15.2*  HCT 44.8 41.6 37.4 43.2 43.7 42.7 43.1  MCV 90.0 89.1 88.2 87.8 87.4 83.9 84.7  PLT PLATELET CLUMPS NOTED ON SMEAR, COUNT APPEARS ADEQUATE 155 160 190 225 189 218   Basic Metabolic Panel:  Recent Labs Lab 04/23/16 1403 04/24/16 0452 04/26/16 0307 04/27/16 0924 04/27/16 1427 04/28/16 0153 04/29/16 0512  NA 135 135 134* 134*  --  131* 131*  K 4.3 4.4 5.0 3.8  --  3.8 4.4  CL 97* 100* 104 101  --  96* 97*  CO2 30 28 26 26   --  27 26  GLUCOSE 99 194* 153* 101*  --  196* 139*  BUN 27* 30* 28* 21*  --  20 27*  CREATININE 1.20* 1.19* 0.96 0.82 0.81 0.98 1.21*  CALCIUM 9.2 8.8* 8.6* 9.2  --  8.8* 8.9  MG 2.3  --   --   --   --   --   --    GFR: Estimated Creatinine Clearance: 33.9 mL/min (by C-G formula based on Cr of 1.21). Liver Function Tests: No results for input(s): AST, ALT, ALKPHOS, BILITOT, PROT, ALBUMIN in the last 168 hours. No results for input(s): LIPASE, AMYLASE in the last 168 hours. No results for input(s): AMMONIA in the last 168  hours. Coagulation Profile: No results for input(s): INR, PROTIME in the last 168 hours. Cardiac Enzymes:  Recent Labs Lab 04/23/16 1403 04/27/16 1427 04/27/16 2027 04/28/16 0153  TROPONINI <0.03 <0.03 <0.03 <0.03   BNP (last 3 results)  Recent Labs  08/09/15 1047  PROBNP 72.0   HbA1C: No results for input(s): HGBA1C in the last 72 hours. CBG:  Recent Labs Lab 04/25/16 1158 04/25/16 1703 04/25/16 2156 04/26/16 0735 04/26/16 1111  GLUCAP 173* 119* 139* 115* 255*   Lipid Profile: No results for input(s): CHOL, HDL, LDLCALC, TRIG, CHOLHDL, LDLDIRECT in the last 72 hours. Thyroid Function Tests: No results for input(s): TSH, T4TOTAL, FREET4, T3FREE, THYROIDAB in the last 72 hours. Anemia Panel: No results for input(s): VITAMINB12, FOLATE, FERRITIN, TIBC, IRON, RETICCTPCT in the last 72 hours. Sepsis Labs: No results for input(s): PROCALCITON, LATICACIDVEN in the last 168 hours.  Recent Results (from the past 240 hour(s))  Urine culture     Status: None  Collection Time: 04/20/16  3:13 PM  Result Value Ref Range Status   Colony Count >=100,000 COLONIES/ML  Final   Organism ID, Bacteria Multiple bacterial morphotypes present, none  Final   Organism ID, Bacteria predominant. Suggest appropriate recollection if   Final   Organism ID, Bacteria clinically indicated.  Final  Culture, Urine     Status: Abnormal   Collection Time: 04/23/16  4:03 PM  Result Value Ref Range Status   Specimen Description URINE, CLEAN CATCH  Final   Special Requests NONE  Final   Culture >=100,000 COLONIES/mL KLEBSIELLA PNEUMONIAE (A)  Final   Report Status 04/26/2016 FINAL  Final   Organism ID, Bacteria KLEBSIELLA PNEUMONIAE (A)  Final      Susceptibility   Klebsiella pneumoniae - MIC*    AMPICILLIN 16 RESISTANT Resistant     CEFAZOLIN <=4 SENSITIVE Sensitive     CEFTRIAXONE <=1 SENSITIVE Sensitive     CIPROFLOXACIN <=0.25 SENSITIVE Sensitive     GENTAMICIN <=1 SENSITIVE Sensitive      IMIPENEM <=0.25 SENSITIVE Sensitive     NITROFURANTOIN <=16 SENSITIVE Sensitive     TRIMETH/SULFA <=20 SENSITIVE Sensitive     AMPICILLIN/SULBACTAM 4 SENSITIVE Sensitive     PIP/TAZO <=4 SENSITIVE Sensitive     * >=100,000 COLONIES/mL KLEBSIELLA PNEUMONIAE  Culture, blood (routine x 2) Call MD if unable to obtain prior to antibiotics being given     Status: None   Collection Time: 04/23/16  5:50 PM  Result Value Ref Range Status   Specimen Description BLOOD RIGHT HAND  Final   Special Requests BOTTLES DRAWN AEROBIC ONLY 5CC  Final   Culture   Final    NO GROWTH 5 DAYS Performed at [facility]    Report Status 04/28/2016 FINAL  Final  Culture, blood (routine x 2) Call MD if unable to obtain prior to antibiotics being given     Status: None   Collection Time: 04/23/16  5:51 PM  Result Value Ref Range Status   Specimen Description BLOOD RIGHT WRIST  Final   Special Requests BOTTLES DRAWN AEROBIC AND ANAEROBIC 5CC  Final   Culture   Final    NO GROWTH 5 DAYS Performed at [facility]    Report Status 04/28/2016 FINAL  Final  MRSA PCR Screening     Status: None   Collection Time: 04/23/16  6:42 PM  Result Value Ref Range Status   MRSA by PCR NEGATIVE NEGATIVE Final    Comment:        The GeneXpert MRSA Assay (FDA approved for NASAL specimens only), is one component of a comprehensive MRSA colonization surveillance program. It is not intended to diagnose MRSA infection nor to guide or monitor treatment for MRSA infections.   Urine culture     Status: Abnormal   Collection Time: 04/27/16 11:27 AM  Result Value Ref Range Status   Specimen Description URINE, RANDOM  Final   Special Requests NONE  Final   Culture (A)  Final    <10,000 COLONIES/mL INSIGNIFICANT GROWTH Performed at [facility]    Report Status 04/29/2016 FINAL  Final         Radiology Studies: Dg Swallowing Func-speech Pathology  04/28/2016  Objective Swallowing Evaluation: Type  of Study: MBS-Modified Barium Swallow Study Patient Details Name: KIMORI TARTAGLIA MRN: [id number] Date of Birth: 08-21-1923 Today's Date: 04/28/2016 Time: SLP Start Time (ACUTE ONLY): 1315-SLP Stop Time (ACUTE ONLY): 1340 SLP Time Calculation (min) (ACUTE ONLY): 25 min Past  Medical History: Past Medical History Diagnosis Date . Shortness of breath  . Unspecified essential hypertension  . Right bundle branch block  . Other and unspecified hyperlipidemia  . Other abnormal glucose  . Diverticulosis of colon (without mention of hemorrhage)  . Pyelonephritis, unspecified  . Solitary cyst of breast  . Osteoarthrosis, unspecified whether generalized or localized, unspecified site  . Lumbago  . Spinal stenosis, unspecified region other than cervical  . Anemia, unspecified  . GERD (gastroesophageal reflux disease)  . Pyelonephritis  . DJD (degenerative joint disease)  . UTI (lower urinary tract infection)  Past Surgical History: Past Surgical History Procedure Laterality Date . Cataract extraction   . Right hip hemiarthroplasty     total . Conversion to right thr   HPI: Patient is a 82 y.o. female who was readmitted after discharge on 6/9 after treatment for respiratory failure. Repeat CXR on 6/10 revealed right lower lobe infiltrate. PMH: GERD, SOB, spinal stenosis, COPD. Subjective: pleasant "Can I go home?" Assessment / Plan / Recommendation CHL IP CLINICAL IMPRESSIONS 04/28/2016 Therapy Diagnosis Mild oral phase dysphagia;Suspected primary esophageal dysphagia;Mild pharyngeal phase dysphagia Clinical Impression Patient presents with a mild oropharyngeal dysphagia characterized by mastication, manipulation and anterior to posterior movement delays with hard solids, with swallow initiation delay to vallecular sinus with hard solids. Thin liquids via large and small cup sips and via large and successive staw sips did not result in any penetration or aspiration. SLP observed (Radiologist not present to confirm) a mild  retrograde movement of thin liquids to level of pyriform sinus and what appeared to be a tight UES, or some restriction in UES opening. Patient was able to clear all residuals naturally, and cleared solid texture residuals with sip of thin liquids.  Impact on safety and function Mild aspiration risk   CHL IP TREATMENT RECOMMENDATION 04/28/2016 Treatment Recommendations Therapy as outlined in treatment plan below   Prognosis 04/28/2016 Prognosis for Safe Diet Advancement Good Barriers to Reach Goals -- Barriers/Prognosis Comment -- CHL IP DIET RECOMMENDATION 04/28/2016 SLP Diet Recommendations Dysphagia 3 (Mech soft) solids;Thin liquid Liquid Administration via Cup;Straw Medication Administration Whole meds with puree Compensations Slow rate;Small sips/bites Postural Changes Remain semi-upright after after feeds/meals (Comment);Seated upright at 90 degrees   CHL IP OTHER RECOMMENDATIONS 04/28/2016 Recommended Consults Consider GI evaluation Oral Care Recommendations -- Other Recommendations --   CHL IP FOLLOW UP RECOMMENDATIONS 04/28/2016 Follow up Recommendations None   CHL IP FREQUENCY AND DURATION 04/28/2016 Speech Therapy Frequency (ACUTE ONLY) min 1 x/week Treatment Duration --      CHL IP ORAL PHASE 04/28/2016 Oral Phase Impaired Oral - Pudding Teaspoon -- Oral - Pudding Cup -- Oral - Honey Teaspoon -- Oral - Honey Cup -- Oral - Nectar Teaspoon -- Oral - Nectar Cup -- Oral - Nectar Straw -- Oral - Thin Teaspoon -- Oral - Thin Cup WFL Oral - Thin Straw WFL Oral - Puree Weak lingual manipulation;Reduced posterior propulsion;Delayed oral transit Oral - Mech Soft -- Oral - Regular Weak lingual manipulation;Impaired mastication;Piecemeal swallowing;Delayed oral transit Oral - Multi-Consistency -- Oral - Pill WFL Oral Phase - Comment --  CHL IP PHARYNGEAL PHASE 04/28/2016 Pharyngeal Phase Impaired Pharyngeal- Pudding Teaspoon -- Pharyngeal -- Pharyngeal- Pudding Cup -- Pharyngeal -- Pharyngeal- Honey Teaspoon -- Pharyngeal  -- Pharyngeal- Honey Cup -- Pharyngeal -- Pharyngeal- Nectar Teaspoon -- Pharyngeal -- Pharyngeal- Nectar Cup -- Pharyngeal -- Pharyngeal- Nectar Straw -- Pharyngeal -- Pharyngeal- Thin Teaspoon -- Pharyngeal -- Pharyngeal- Thin Cup WFL Pharyngeal -- Pharyngeal- Thin  Straw WFL Pharyngeal -- Pharyngeal- Puree WFL Pharyngeal -- Pharyngeal- Mechanical Soft -- Pharyngeal -- Pharyngeal- Regular Delayed swallow initiation-vallecula;Pharyngeal residue - valleculae;Compensatory strategies attempted (with notebox) Pharyngeal -- Pharyngeal- Multi-consistency -- Pharyngeal -- Pharyngeal- Pill WFL Pharyngeal -- Pharyngeal Comment --  CHL IP CERVICAL ESOPHAGEAL PHASE 04/28/2016 Cervical Esophageal Phase WFL Pudding Teaspoon -- Pudding Cup -- Honey Teaspoon -- Honey Cup -- Nectar Teaspoon -- Nectar Cup -- Nectar Straw -- Thin Teaspoon -- Thin Cup -- Thin Straw -- Puree -- Mechanical Soft -- Regular -- Multi-consistency -- Pill -- Cervical Esophageal Comment -- CHL IP GO 08/13/2014 Functional Assessment Tool Used clinical judgement Functional Limitations Swallowing Swallow Current Status (Z6109(G8996) CJ Swallow Goal Status (U0454(G8997) CJ Swallow Discharge Status (U9811(G8998) CJ Motor Speech Current Status (B1478(G8999) (None) Motor Speech Goal Status (G9562(G9186) (None) Motor Speech Goal Status (Z3086(G9158) (None) Spoken Language Comprehension Current Status (V7846(G9159) (None) Spoken Language Comprehension Goal Status (N6295(G9160) (None) Spoken Language Comprehension Discharge Status (M8413(G9161) (None) Spoken Language Expression Current Status (K4401(G9162) (None) Spoken Language Expression Goal Status (U2725(G9163) (None) Spoken Language Expression Discharge Status (D6644(G9164) (None) Attention Current Status (I3474(G9165) (None) Attention Goal Status (Q5956(G9166) (None) Attention Discharge Status (L8756(G9167) (None) Memory Current Status (E3329(G9168) (None) Memory Goal Status (J1884(G9169) (None) Memory Discharge Status (Z6606(G9170) (None) Voice Current Status (T0160(G9171) (None) Voice Goal Status (F0932(G9172) (None) Voice  Discharge Status (T5573(G9173) (None) Other Speech-Language Pathology Functional Limitation (U2025(G9174) (None) Other Speech-Language Pathology Functional Limitation Goal Status (K2706(G9175) (None) Other Speech-Language Pathology Functional Limitation Discharge Status [phone](G9176) (None) Elio Forgetreston, John Tarrell 04/28/2016, 3:05 PM    Angela NevinJohn T. Preston, MA, CCC-SLP 04/28/2016 3:06 PM                Scheduled Meds: . amLODipine  5 mg Oral Daily  . aspirin EC  81 mg Oral Daily  . budesonide (PULMICORT) nebulizer solution  0.25 mg Nebulization BID  . cholecalciferol  2,000 Units Oral Daily  . diltiazem  60 mg Oral Q6H  . enoxaparin (LOVENOX) injection  40 mg Subcutaneous Q24H  . famotidine  20 mg Oral Daily  . fluticasone  2 spray Each Nare Daily  . ipratropium-albuterol  3 mL Nebulization TID  . loratadine  10 mg Oral Daily  . methimazole  5 mg Oral QODAY  . montelukast  10 mg Oral QHS  . multivitamin with minerals  1 tablet Oral Daily  . piperacillin-tazobactam (ZOSYN)  IV  3.375 g Intravenous Q8H  . [START ON 04/30/2016] predniSONE  40 mg Oral Q breakfast  . sertraline  50 mg Oral QHS  . torsemide  20 mg Oral Daily  . vancomycin  1,250 mg Intravenous Q24H   Continuous Infusions:    LOS: 2 days    Time spent: 25 min    Jeralyn BennettZAMORA, Eshawn Coor, MD [facility] Pager [phone]  If 7PM-7AM, please contact night-coverage [email or web address] Password [email or web address] 04/29/2016, 12:08 PM

## 2016-04-30 ENCOUNTER — Inpatient Hospital Stay (HOSPITAL_COMMUNITY): Payer: Medicare Other

## 2016-04-30 DIAGNOSIS — I10 Essential (primary) hypertension: Secondary | ICD-10-CM

## 2016-04-30 LAB — BASIC METABOLIC PANEL
ANION GAP: 9 (ref 5–15)
BUN: 34 mg/dL — ABNORMAL HIGH (ref 6–20)
CHLORIDE: 93 mmol/L — AB (ref 101–111)
CO2: 29 mmol/L (ref 22–32)
Calcium: 8.8 mg/dL — ABNORMAL LOW (ref 8.9–10.3)
Creatinine, Ser: 1.57 mg/dL — ABNORMAL HIGH (ref 0.44–1.00)
GFR calc non Af Amer: 28 mL/min — ABNORMAL LOW (ref >60)
GFR, EST AFRICAN AMERICAN: 32 mL/min — AB (ref >60)
Glucose, Bld: 141 mg/dL — ABNORMAL HIGH (ref 65–99)
Potassium: 3.9 mmol/L (ref 3.5–5.1)
Sodium: 131 mmol/L — ABNORMAL LOW (ref 135–145)

## 2016-04-30 LAB — CBC
HCT: 41.4 % (ref 36.0–46.0)
HEMOGLOBIN: 14.2 g/dL (ref 12.0–15.0)
MCH: 29.3 pg (ref 26.0–34.0)
MCHC: 34.3 g/dL (ref 30.0–36.0)
MCV: 85.4 fL (ref 78.0–100.0)
PLATELETS: 204 10*3/uL (ref 150–400)
RBC: 4.85 MIL/uL (ref 3.87–5.11)
RDW: 13.9 % (ref 11.5–15.5)
WBC: 14.6 10*3/uL — ABNORMAL HIGH (ref 4.0–10.5)

## 2016-04-30 MED ORDER — SODIUM CHLORIDE 0.9 % IV BOLUS (SEPSIS)
500.0000 mL | Freq: Once | INTRAVENOUS | Status: AC
  Administered 2016-04-30: 500 mL via INTRAVENOUS

## 2016-04-30 MED ORDER — ENOXAPARIN SODIUM 30 MG/0.3ML ~~LOC~~ SOLN
30.0000 mg | SUBCUTANEOUS | Status: DC
  Administered 2016-04-30: 30 mg via SUBCUTANEOUS
  Filled 2016-04-30: qty 0.3

## 2016-04-30 NOTE — Progress Notes (Signed)
Pharmacy Antibiotic Note  Gwendolyn DolphinMargaret J [name] is a 82 y.o. female recently hospitalized for COPD exacerbation (6/5-6/8), [facility]/27/2016 with SOB and cough.  Chest x-ray on admission showed infiltrate at right base.  Pharmacy has been consulted for Vancomycin and Zosyn dosing for HCAP vs aspiration pneumonia.  SCr worsening.  CrCl~26 ml/min/1.3673m2 (normalized), ~29 ml/min (CG).   Plan: Check vancomycin trough level tonight since SCr increasing. Continue Zosyn 3.375g IV Q8H infused over 4hrs as long as CrCl>20 ml/min.   Height: 5\' 8"  (172.7 cm) Weight: 180 lb 1.9 oz (81.7 kg) IBW/kg (Calculated) : 63.9  Temp (24hrs), Avg:98.3 F (36.8 C), Min:98 F (36.7 C), Max:99 F (37.2 C)   Recent Labs Lab 04/27/16 0924 04/27/16 1427 04/28/16 0153 04/29/16 0512 04/30/16 0429  WBC 10.4 9.2 9.2 10.9* 14.6*  CREATININE 0.82 0.81 0.98 1.21* 1.57*    Estimated Creatinine Clearance: 26.2 mL/min (by C-G formula based on Cr of 1.57).    Allergies  Allergen Reactions  . Azithromycin Shortness Of Breath  . Ciprofloxacin Other (See Comments)     hallucinations  . Levofloxacin Other (See Comments)    Insomnia, indigestion, tingling sensation in legs  . Furosemide Rash    Bullous pemphigoid  . Latex Rash  . Other Rash    EKG leads caused a rash that required steroids to clear    Antimicrobials this admission: 6/9 cefepime >> 6/10 6/9 vancomycin >>  6/10 zosyn >>  Dose adjustments this admission: 6/10 Vanc empirically reduced for age and rising SCr (baseline higher than current SCr). 6/12 2300 VT: ___ (SCr increasing, level prior to 3rd 1250 mg q24h dose)  Microbiology results: Previous admission 6/5 UCx: >100k Klebsiella (pan-sens except ampicillin) 6/5 MRSA PCR: negative 6/5 BCx: ngtd 6/6 Strep pneumo Ag: neg 6/6 Leg Ag: neg  Current admission 6/9 UCx: < 10k colonies, insignificant growth  Thank you for allowing pharmacy to be a part  of this patient's care.  Clance BollAmanda Zaccai Chavarin, PharmD, BCPS Pager: [phone] 04/30/2016 10:29 AM

## 2016-04-30 NOTE — Care Management Important Message (Signed)
Important Message  Patient Details  Name: [name] MRN: [id number] Date of Birth: 03-16-1923   Medicare Important Message Given:  Yes    Haskell FlirtJamison, Tyriq Moragne 04/30/2016, 10:20 AMImportant Message  Patient Details  Name: [name] MRN: [id number] Date of Birth: 03-16-1923   Medicare Important Message Given:  Yes    Haskell FlirtJamison, Amador Braddy 04/30/2016, 10:20 AM

## 2016-04-30 NOTE — [place]2 (Signed)
[place] MEDICAID FL2 LEVEL OF CARE SCREENING TOOL     IDENTIFICATION  Patient Name: [name] Birthdate: March 18, 1923 Sex: female Admission Date (Current Location): 04/27/2016  [facility] and [place] Number:  Producer, television/film/video and Address:  [facility],  [address] [place] [address]      Provider Number: [id number]  Attending Physician Name and Address:  Jeralyn Bennett, MD  Relative Name and Phone Number:       Current Level of Care: Hospital Recommended Level of Care:   ALF Prior Approval Number:    Date Approved/Denied:   PASRR Number:    Discharge Plan: Other (Comment) Return to [facility]    Current Diagnoses: Patient Active Problem List   Diagnosis Date Noted  . Respiratory failure (HCC) 04/27/2016  . Pressure ulcer 04/24/2016  . Hypoxia   . Acute respiratory failure with hypoxia (HCC) 04/23/2016  . HCAP (healthcare-associated pneumonia): Probable 04/23/2016  . COPD exacerbation (HCC) 04/23/2016  . Dehydration 04/23/2016  . CKD (chronic kidney disease), stage III 04/23/2016  . Headache   . Allergic rhinitis 01/11/2016  . Thyrotoxicosis 12/07/2015  . Fatigue 11/22/2015  . Insomnia 11/22/2015  . Choking sensation 12/09/2014  . Rash 12/09/2014  . COPD (chronic obstructive pulmonary disease) (HCC) 11/17/2014  . Chronic venous insufficiency 04/14/2014  . Abnormality of gait 08/17/2013  . Acute URI 07/29/2012  . GERD (gastroesophageal reflux disease) 09/12/2011  . Neck pain 06/18/2011  . Anxiety 05/14/2011  . Acute bronchitis 05/02/2011  . ONYCHOMYCOSIS 12/13/2008  . UTI 12/13/2008  . Bullous pemphigoid 12/13/2008  . EDEMA 10/21/2008  . Diverticulosis of large intestine 12/01/2007  . Constipation 12/01/2007  . PYELONEPHRITIS 12/01/2007  . BREAST CYST 12/01/2007  . Osteoarthritis 12/01/2007  . DYSPNEA 12/01/2007  . Spinal stenosis of lumbar region 10/28/2007  . Elevated lipids 10/27/2007  . ANEMIA 10/27/2007  . Essential  hypertension 10/27/2007  . RIGHT BUNDLE BRANCH BLOCK 10/27/2007  . LOW BACK PAIN SYNDROME 10/27/2007  . DIABETES MELLITUS, BORDERLINE 10/27/2007    Orientation RESPIRATION BLADDER Height & Weight     Self, Place  Normal Continent Weight: 180 lb 1.9 oz (81.7 kg) Height:   (172.7 cm)  BEHAVIORAL SYMPTOMS/MOOD NEUROLOGICAL BOWEL NUTRITION STATUS      Continent Diet (Dysphasia 3; thin liquid)  AMBULATORY STATUS COMMUNICATION OF NEEDS Skin   Limited Assist Verbally PU Stage and Appropriate Care (PressureUlcer06/05/17StageI-Intactskinwithnon-blanchablerednessofalocalizedareausuallyoverabonyprominence.rednesswithhealingscars (right/left buttocks))                       Personal Care Assistance Level of Assistance  Bathing, Feeding, Dressing Bathing Assistance: Limited assistance Feeding assistance: Limited assistance Dressing Assistance: Limited assistance     Functional Limitations Info  Sight, Hearing, Speech Sight Info: Impaired Hearing Info: Impaired Speech Info: Adequate    SPECIAL CARE FACTORS FREQUENCY  OT (By licensed OT), PT (By licensed PT)                    Contractures      Additional Factors Info  Code Status, Allergies, Psychotropic, Insulin Sliding Scale Code Status Info: Fullcode Allergies Info: Allergies:  Azithromycin, Ciprofloxacin, Levofloxacin, Furosemide, Latex, Other           Current Medications (04/30/2016):  This is the current hospital active medication list Current Facility-Administered Medications  Medication Dose Route Frequency Provider Last Rate Last Dose  . acetaminophen (TYLENOL) tablet 650 mg  650 mg Oral Q4H PRN Barnetta Chapel, MD      .  ALPRAZolam Prudy Feeler(XANAX) tablet 0.25 mg  0.25 mg Oral QHS PRN Barnetta ChapelSylvester I Ogbata, MD   0.25 mg at 04/29/16 2136  . alum & mag hydroxide-simeth (MAALOX/MYLANTA) 200-200-20 MG/5ML suspension 15 mL  15 mL Oral Q6H PRN Jeralyn BennettEzequiel Zamora, MD   15 mL at 04/29/16 2232  .  amLODipine (NORVASC) tablet 5 mg  5 mg Oral Daily Barnetta ChapelSylvester I Ogbata, MD   5 mg at 04/30/16 1059  . aspirin EC tablet 81 mg  81 mg Oral Daily Barnetta ChapelSylvester I Ogbata, MD   81 mg at 04/30/16 1058  . budesonide (PULMICORT) nebulizer solution 0.25 mg  0.25 mg Nebulization BID Barnetta ChapelSylvester I Ogbata, MD   0.25 mg at 04/30/16 0848  . cholecalciferol (VITAMIN D) tablet 2,000 Units  2,000 Units Oral Daily Barnetta ChapelSylvester I Ogbata, MD   2,000 Units at 04/30/16 1058  . diltiazem (CARDIZEM) tablet 60 mg  60 mg Oral Q6H Barnetta ChapelSylvester I Ogbata, MD   60 mg at 04/30/16 0511  . enoxaparin (LOVENOX) injection 30 mg  30 mg Subcutaneous Q24H Jeralyn BennettEzequiel Zamora, MD      . famotidine (PEPCID) tablet 20 mg  20 mg Oral Daily Barnetta ChapelSylvester I Ogbata, MD   20 mg at 04/30/16 1058  . fluticasone (FLONASE) 50 MCG/ACT nasal spray 2 spray  2 spray Each Nare Daily Barnetta ChapelSylvester I Ogbata, MD   2 spray at 04/30/16 1059  . guaifenesin (ROBITUSSIN) 100 MG/5ML syrup 200 mg  200 mg Oral TID PRN Barnetta ChapelSylvester I Ogbata, MD      . ipratropium-albuterol (DUONEB) 0.5-2.5 (3) MG/3ML nebulizer solution 3 mL  3 mL Nebulization Q4H PRN Barnetta ChapelSylvester I Ogbata, MD      . ipratropium-albuterol (DUONEB) 0.5-2.5 (3) MG/3ML nebulizer solution 3 mL  3 mL Nebulization TID Jeralyn BennettEzequiel Zamora, MD   3 mL at 04/30/16 0841  . loratadine (CLARITIN) tablet 10 mg  10 mg Oral Daily Barnetta ChapelSylvester I Ogbata, MD   10 mg at 04/30/16 1058  . methimazole (TAPAZOLE) tablet 5 mg  5 mg Oral QODAY Barnetta ChapelSylvester I Ogbata, MD   5 mg at 04/29/16 1041  . montelukast (SINGULAIR) tablet 10 mg  10 mg Oral QHS Barnetta ChapelSylvester I Ogbata, MD   10 mg at 04/29/16 2136  . multivitamin with minerals tablet 1 tablet  1 tablet Oral Daily Barnetta ChapelSylvester I Ogbata, MD   1 tablet at 04/30/16 1058  . piperacillin-tazobactam (ZOSYN) IVPB 3.375 g  3.375 g Intravenous Q8H Jeralyn BennettEzequiel Zamora, MD   3.375 g at 04/30/16 0511  . polyethylene glycol (MIRALAX / GLYCOLAX) packet 17 g  17 g Oral Daily PRN Barnetta ChapelSylvester I Ogbata, MD      . sertraline (ZOLOFT) tablet 50 mg   50 mg Oral QHS Barnetta ChapelSylvester I Ogbata, MD   50 mg at 04/29/16 2136     Discharge Medications: Please see discharge summary for a list of discharge medications.  Relevant Imaging Results:  Relevant Lab Results:   Additional Information SS#:[id number]  Raye SorrowCoble, Ifeoluwa Bartz N, [place]

## 2016-04-30 NOTE — Progress Notes (Signed)
PROGRESS NOTE    [name]  ZOX:[id number] DOB: 13-May-1923 DOA: 04/27/2016 PCP: Michele Mcalpine, MD    Brief Narrative:  [name] is a 82 year old nursing home resident, who was recently discharged on 04/26/2016 at which time she was treated for a COPD exacerbation. A chest x-ray obtained on 04/23/2016 was interpreted as not having active disease by radiology. She even had a CT scan of lungs without contrast that revealed stable chronic bronchitic changes, no new infiltrates noted. During that hospitalization she was treated with systemic steroids, nebulizer treatments and antibiotic therapy. She showed clinical improvement as supplemental oxygen was titrated off and she was able to ambulate down the hallway with a walker. She was discharged back to her skilled nursing facility in stable condition. Overnight she developed worsening shortness of breath and came back to the emergency department. Chest x-ray on admission showed infiltrate at right base   Assessment & Plan:   Principal Problem:   Respiratory failure (HCC) Active Problems:   Essential hypertension   COPD (chronic obstructive pulmonary disease) (HCC)   HCAP (healthcare-associated pneumonia): Probable   CKD (chronic kidney disease), stage III   1. Acute respiratory failure with hypoxia -Evidenced by patient's presentation in respiratory distress and having oxygen requirement of 3 L nasal cannula to maintain saturations -I am highly suspicious that she may have had a aspiration event at her skilled nursing facility -Continue antibiotics, supplemental oxygen -Modified barium study performed on 04/28/2016 showing evidence of mild oropharyngeal dysphagia   -SLP recommending esophagram, pending results at this time  2. Aspiration pneumonia versus healthcare associated pneumonia -On a recent hospitalization she was treated for COPD exacerbation. During that hospitalization she had a chest x-ray along with a CT scan of  lungs.did not reveal acute infiltrate. -During that hospitalization she got better as oxygen was titrated off. She was discharged back to her skilled nursing facility -She presented the following day with worsening shortness of breath as a chest x-ray performed in the emergency room interpreted by radiology to have a right lower lobe infiltrate. -Modified barium study performed on 04/28/2016 showing evidence of mild oropharyngeal dysphagia, speech recommending dysphagia 3 diet.  -Repeat CXR showing RLL infiltrate which seems stable. -Will stop IV Vancomycin  3. Hypertension. -Continue amlodipine 5 mg by mouth daily  4. Hyperthyroidism -She is on methimazole 5 mg every other day -Last TSH was checked on 04/12/2016, within normal limits at 3.87.   5. Stage III chronic kidney disease -Cr trending up to 1.57 from 1.2 -Will hold torsemide today and give 500 mL bolus of NS  6. History of borderline diabetes -Hemoglobin A1c 6.3   7.  COPD exacerbation -She was recently treated for COPD exacerbation  -Will stop Prednisone today, lung exam significantly improved  DVT prophylaxis: Lovenox Code Status: Full Family Communication: I spoke with caregiver, I tried to call her daughter [name] at [phone] Disposition Plan: Anticipate discharge to skilled nursing facility when medically stable  Antimicrobials:   Zosyn  Vancomycin stopped on 04/30/2016  Subjective: She was ambulated down the hallway, desatted to 87% Reports feeling better and wants to go home.   Objective: Filed Vitals:   04/29/16 2159 04/30/16 0506 04/30/16 0842 04/30/16 1059  BP: 125/94 134/62  129/63  Pulse: 70 70    Temp: 98 F (36.7 C) 99 F (37.2 C)    TempSrc: Oral Oral    Resp: 16 16    Height:      Weight:      SpO2:  95% 97% 96%     Intake/Output Summary (Last 24 hours) at 04/30/16 1231 Last data filed at 04/30/16 0511  Gross per 24 hour  Intake   1070 ml  Output      0 ml  Net   1070 ml    Filed Weights   04/27/16 1602  Weight: 81.7 kg (180 lb 1.9 oz)    Examination:  General exam: She appears stable, ambulated down the hallway Respiratory system: There is some improvement to lung exam, less rhonchi Cardiovascular system: S1 & S2 heard, RRR. No JVD, murmurs, rubs, gallops or clicks. No pedal edema. Gastrointestinal system: Abdomen is nondistended, soft and nontender. No organomegaly or masses felt. Normal bowel sounds heard. Central nervous system: Alert and oriented. No focal neurological deficits. Extremities: Symmetric 5 x 5 power. Skin: No rashes, lesions or ulcers Psychiatry: Pleasantly confused, disoriented.     Data Reviewed: I have personally reviewed following labs and imaging studies  CBC:  Recent Labs Lab 04/23/16 1403 04/24/16 0452  04/27/16 0924 04/27/16 1427 04/28/16 0153 04/29/16 0512 04/30/16 0429  WBC 3.8* 4.1  < > 10.4 9.2 9.2 10.9* 14.6*  NEUTROABS 2.1 3.4  --  7.0  --   --   --   --   HGB 14.7 13.8  < > 14.3 14.8 15.0 15.2* 14.2  HCT 44.8 41.6  < > 43.2 43.7 42.7 43.1 41.4  MCV 90.0 89.1  < > 87.8 87.4 83.9 84.7 85.4  PLT PLATELET CLUMPS NOTED ON SMEAR, COUNT APPEARS ADEQUATE 155  < > 190 225 189 218 204  < > = values in this interval not displayed. Basic Metabolic Panel:  Recent Labs Lab 04/23/16 1403  04/26/16 0307 04/27/16 0924 04/27/16 1427 04/28/16 0153 04/29/16 0512 04/30/16 0429  NA 135  < > 134* 134*  --  131* 131* 131*  K 4.3  < > 5.0 3.8  --  3.8 4.4 3.9  CL 97*  < > 104 101  --  96* 97* 93*  CO2 30  < > 26 26  --  GLUCOSE 99  < > 153* 101*  --  196* 139* 141*  BUN 27*  < > 28* 21*  --  20 27* 34*  CREATININE 1.20*  < > 0.96 0.82 0.81 0.98 1.21* 1.57*  CALCIUM 9.2  < > 8.6* 9.2  --  8.8* 8.9 8.8*  MG 2.3  --   --   --   --   --   --   --   < > = values in this interval not displayed. GFR: Estimated Creatinine Clearance: 26.2 mL/min (by C-G formula based on Cr of 1.57). Liver Function Tests: No  results for input(s): AST, ALT, ALKPHOS, BILITOT, PROT, ALBUMIN in the last 168 hours. No results for input(s): LIPASE, AMYLASE in the last 168 hours. No results for input(s): AMMONIA in the last 168 hours. Coagulation Profile: No results for input(s): INR, PROTIME in the last 168 hours. Cardiac Enzymes:  Recent Labs Lab 04/23/16 1403 04/27/16 1427 04/27/16 2027 04/28/16 0153  TROPONINI <0.03 <0.03 <0.03 <0.03   BNP (last 3 results)  Recent Labs  08/09/15 1047  PROBNP 72.0   HbA1C: No results for input(s): HGBA1C in the last 72 hours. CBG:  Recent Labs Lab 04/25/16 1158 04/25/16 1703 04/25/16 2156 04/26/16 0735 04/26/16 1111  GLUCAP 173* 119* 139* 115* 255*   Lipid Profile: No results for input(s): CHOL, HDL, LDLCALC, TRIG, CHOLHDL, LDLDIRECT in  the last 72 hours. Thyroid Function Tests: No results for input(s): TSH, T4TOTAL, FREET4, T3FREE, THYROIDAB in the last 72 hours. Anemia Panel: No results for input(s): VITAMINB12, FOLATE, FERRITIN, TIBC, IRON, RETICCTPCT in the last 72 hours. Sepsis Labs: No results for input(s): PROCALCITON, LATICACIDVEN in the last 168 hours.  Recent Results (from the past 240 hour(s))  Urine culture     Status: None   Collection Time: 04/20/16  3:13 PM  Result Value Ref Range Status   Colony Count >=100,000 COLONIES/ML  Final   Organism ID, Bacteria Multiple bacterial morphotypes present, none  Final   Organism ID, Bacteria predominant. Suggest appropriate recollection if   Final   Organism ID, Bacteria clinically indicated.  Final  Culture, Urine     Status: Abnormal   Collection Time: 04/23/16  4:03 PM  Result Value Ref Range Status   Specimen Description URINE, CLEAN CATCH  Final   Special Requests NONE  Final   Culture >=100,000 COLONIES/mL KLEBSIELLA PNEUMONIAE (A)  Final   Report Status 04/26/2016 FINAL  Final   Organism ID, Bacteria KLEBSIELLA PNEUMONIAE (A)  Final      Susceptibility   Klebsiella pneumoniae - MIC*     AMPICILLIN 16 RESISTANT Resistant     CEFAZOLIN <=4 SENSITIVE Sensitive     CEFTRIAXONE <=1 SENSITIVE Sensitive     CIPROFLOXACIN <=0.25 SENSITIVE Sensitive     GENTAMICIN <=1 SENSITIVE Sensitive     IMIPENEM <=0.25 SENSITIVE Sensitive     NITROFURANTOIN <=16 SENSITIVE Sensitive     TRIMETH/SULFA <=20 SENSITIVE Sensitive     AMPICILLIN/SULBACTAM 4 SENSITIVE Sensitive     PIP/TAZO <=4 SENSITIVE Sensitive     * >=100,000 COLONIES/mL KLEBSIELLA PNEUMONIAE  Culture, blood (routine x 2) Call MD if unable to obtain prior to antibiotics being given     Status: None   Collection Time: 04/23/16  5:50 PM  Result Value Ref Range Status   Specimen Description BLOOD RIGHT HAND  Final   Special Requests BOTTLES DRAWN AEROBIC ONLY 5CC  Final   Culture   Final    NO GROWTH 5 DAYS [facility]    Report Status 04/28/2016 FINAL  Final  Culture, blood (routine x 2) Call MD if unable to obtain prior to antibiotics being given     Status: None   Collection Time: 04/23/16  5:51 PM  Result Value Ref Range Status   Specimen Description BLOOD RIGHT WRIST  Final   Special Requests BOTTLES DRAWN AEROBIC AND ANAEROBIC 5CC  Final   Culture   Final    NO GROWTH 5 DAYS [facility]formed at [facility]    Report Status 04/28/2016 FINAL  Final  MRSA PCR Screening     Status: None   Collection Time: 04/23/16  6:42 PM  Result Value Ref Range Status   MRSA by PCR NEGATIVE NEGATIVE Final    Comment:        The GeneXpert MRSA Assay (FDA approved for NASAL specimens only), is one component of a comprehensive MRSA colonization surveillance program. It is not intended to diagnose MRSA infection nor to guide or monitor treatment for MRSA infections.   Urine culture     Status: Abnormal   Collection Time: 04/27/16 11:27 AM  Result Value Ref Range Status   Specimen Description URINE, RANDOM  Final   Special Requests NONE  Final   Culture (A)  Final    <10,000 COLONIES/mL INSIGNIFICANT  GROWTH [facility]   Report Status 04/29/2016 FINAL  Final         Radiology Studies: Dg Chest 2 View  04/30/2016  CLINICAL DATA:  82 year old female with shortness of Breath. Right lung base opacity recently. Initial encounter. EXAM: CHEST  2 VIEW COMPARISON:  Chest radiographs 04/27/2016 and earlier. FINDINGS: Semi upright AP and lateral views of the chest. Chronic elevation of the right hemidiaphragm. Adjacent right lung base opacity appears unchanged since 04/27/2016. Curvilinear left lung base opacity is increased since that time. No definite associated pleural effusion. Stable cardiac size and mediastinal contours. No pneumothorax or pulmonary edema. Stable visualized osseous structures. IMPRESSION: Right greater than left lung base opacity suspicious for lung base pneumonia in this setting but might alternatively be atelectasis. No definite pleural effusion or new cardiopulmonary abnormality. 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 Electronically Signed   By: Odessa Fleming M.D.   On: 04/30/2016 12:11   Dg Swallowing Func-speech Pathology  04/28/2016  Objective Swallowing Evaluation: Type of Study: MBS-Modified Barium Swallow Study Patient Details Name: [name] MRN: [id number] Date of Birth: [birth year] Today's Date: 04/28/2016 Time: SLP Start Time (ACUTE ONLY): 1315-SLP Stop Time (ACUTE ONLY): 1340 SLP Time Calculation (min) (ACUTE ONLY): 25 min Past Medical History: Past Medical History Diagnosis Date . Shortness of breath  . Unspecified essential hypertension  . Right bundle branch block  . Other and unspecified hyperlipidemia  . Other abnormal glucose  . Diverticulosis of colon (without mention of hemorrhage)  . Pyelonephritis, unspecified  . Solitary cyst of breast  . Osteoarthrosis, unspecified whether generalized or localized, unspecified site  . Lumbago  . Spinal stenosis, unspecified region other than  cervical  . Anemia, unspecified  . GERD (gastroesophageal reflux disease)  . Pyelonephritis  . DJD (degenerative joint disease)  . UTI (lower urinary tract infection)  Past Surgical History: Past Surgical History Procedure Laterality Date . Cataract extraction   . Right hip hemiarthroplasty     total . Conversion to right thr   HPI: Patient is a 82 y.o. female who was readmitted after discharge on 6/9 after treatment for respiratory failure. Repeat CXR on 6/10 revealed right lower lobe infiltrate. PMH: GERD, SOB, spinal stenosis, COPD. Subjective: pleasant "Can I go home?" Assessment / Plan / Recommendation CHL IP CLINICAL IMPRESSIONS 04/28/2016 Therapy Diagnosis Mild oral phase dysphagia;Suspected primary esophageal dysphagia;Mild pharyngeal phase dysphagia Clinical Impression Patient presents with a mild oropharyngeal dysphagia characterized by mastication, manipulation and anterior to posterior movement delays with hard solids, with swallow initiation delay to vallecular sinus with hard solids. Thin liquids via large and small cup sips and via large and successive staw sips did not result in any penetration or aspiration. SLP observed (Radiologist not present to confirm) a mild retrograde movement of thin liquids to level of pyriform sinus and what appeared to be a tight UES, or some restriction in UES opening. Patient was able to clear all residuals naturally, and cleared solid texture residuals with sip of thin liquids.  Impact on safety and function Mild aspiration risk   CHL IP TREATMENT RECOMMENDATION 04/28/2016 Treatment Recommendations Therapy as outlined in treatment plan below   Prognosis 04/28/2016 Prognosis for Safe Diet Advancement Good Barriers to Reach Goals -- Barriers/Prognosis Comment -- CHL IP DIET RECOMMENDATION 04/28/2016 SLP Diet Recommendations Dysphagia 3 (Mech soft) solids;Thin liquid Liquid Administration via Cup;Straw Medication Administration Whole meds with puree Compensations Slow  rate;Small sips/bites Postural Changes Remain semi-upright after after feeds/meals (Comment);Seated upright  at 90 degrees   CHL IP OTHER RECOMMENDATIONS 04/28/2016 Recommended Consults Consider GI evaluation Oral Care Recommendations -- Other Recommendations --   CHL IP FOLLOW UP RECOMMENDATIONS 04/28/2016 Follow up Recommendations None   CHL IP FREQUENCY AND DURATION 04/28/2016 Speech Therapy Frequency (ACUTE ONLY) min 1 x/week Treatment Duration --      CHL IP ORAL PHASE 04/28/2016 Oral Phase Impaired Oral - Pudding Teaspoon -- Oral - Pudding Cup -- Oral - Honey Teaspoon -- Oral - Honey Cup -- Oral - Nectar Teaspoon -- Oral - Nectar Cup -- Oral - Nectar Straw -- Oral - Thin Teaspoon -- Oral - Thin Cup WFL Oral - Thin Straw WFL Oral - Puree Weak lingual manipulation;Reduced posterior propulsion;Delayed oral transit Oral - Mech Soft -- Oral - Regular Weak lingual manipulation;Impaired mastication;Piecemeal swallowing;Delayed oral transit Oral - Multi-Consistency -- Oral - Pill WFL Oral Phase - Comment --  CHL IP PHARYNGEAL PHASE 04/28/2016 Pharyngeal Phase Impaired Pharyngeal- Pudding Teaspoon -- Pharyngeal -- Pharyngeal- Pudding Cup -- Pharyngeal -- Pharyngeal- Honey Teaspoon -- Pharyngeal -- Pharyngeal- Honey Cup -- Pharyngeal -- Pharyngeal- Nectar Teaspoon -- Pharyngeal -- Pharyngeal- Nectar Cup -- Pharyngeal -- Pharyngeal- Nectar Straw -- Pharyngeal -- Pharyngeal- Thin Teaspoon -- Pharyngeal -- Pharyngeal- Thin Cup WFL Pharyngeal -- Pharyngeal- Thin Straw WFL Pharyngeal -- Pharyngeal- Puree WFL Pharyngeal -- Pharyngeal- Mechanical Soft -- Pharyngeal -- Pharyngeal- Regular Delayed swallow initiation-vallecula;Pharyngeal residue - valleculae;Compensatory strategies attempted (with notebox) Pharyngeal -- Pharyngeal- Multi-consistency -- Pharyngeal -- Pharyngeal- Pill WFL Pharyngeal -- Pharyngeal Comment --  CHL IP CERVICAL ESOPHAGEAL PHASE 04/28/2016 Cervical Esophageal Phase WFL Pudding Teaspoon -- Pudding Cup -- Honey  Teaspoon -- Honey Cup -- Nectar Teaspoon -- Nectar Cup -- Nectar Straw -- Thin Teaspoon -- Thin Cup -- Thin Straw -- Puree -- Mechanical Soft -- Regular -- Multi-consistency -- Pill -- Cervical Esophageal Comment -- CHL IP GO 08/13/2014 Functional Assessment Tool Used clinical judgement Functional Limitations Swallowing Swallow Current Status (Z6109) CJ Swallow Goal Status (U0454) CJ Swallow Discharge Status (U9811) CJ Motor Speech Current Status (B1478) (None) Motor Speech Goal Status (G9562) (None) Motor Speech Goal Status (Z3086) (None) Spoken Language Comprehension Current Status (V7846) (None) Spoken Language Comprehension Goal Status (N6295) (None) Spoken Language Comprehension Discharge Status (M8413) (None) Spoken Language Expression Current Status (K4401) (None) Spoken Language Expression Goal Status (U2725) (None) Spoken Language Expression Discharge Status (D6644) (None) Attention Current Status (I3474) (None) Attention Goal Status (Q5956) (None) Attention Discharge Status (L8756) (None) Memory Current Status (E3329) (None) Memory Goal Status (J1884) (None) Memory Discharge Status (Z6606) (None) Voice Current Status (T0160) (None) Voice Goal Status (F0932) (None) Voice Discharge Status (T5573) (None) Other Speech-Language Pathology Functional Limitation (U2025) (None) Other Speech-Language Pathology Functional Limitation Goal Status (K2706) (None) Other Speech-Language Pathology Functional Limitation Discharge Status [phone]) (None) Elio Forget Tarrell 04/28/2016, 3:05 PM    Angela Nevin, MA, CCC-SLP 04/28/2016 3:06 PM                Scheduled Meds: . amLODipine  5 mg Oral Daily  . aspirin EC  81 mg Oral Daily  . budesonide (PULMICORT) nebulizer solution  0.25 mg Nebulization BID  . cholecalciferol  2,000 Units Oral Daily  . diltiazem  60 mg Oral Q6H  . enoxaparin (LOVENOX) injection  30 mg Subcutaneous Q24H  . famotidine  20 mg Oral Daily  . fluticasone  2 spray Each Nare Daily  .  ipratropium-albuterol  3 mL Nebulization TID  . loratadine  10 mg Oral Daily  . methimazole  5 mg Oral QODAY  . montelukast  10 mg Oral QHS  . multivitamin with minerals  1 tablet Oral Daily  . piperacillin-tazobactam (ZOSYN)  IV  3.375 g Intravenous Q8H  . sertraline  50 mg Oral QHS   Continuous Infusions:    LOS: 3 days    Time spent: 25 min    Jeralyn Bennett, MD [facility] Pager [phone]  If 7PM-7AM, please contact night-coverage [email or web address] Password [facility] 04/30/2016, 12:31 PM

## 2016-05-01 DIAGNOSIS — L8991 Pressure ulcer of unspecified site, stage 1: Secondary | ICD-10-CM

## 2016-05-01 LAB — BASIC METABOLIC PANEL
Anion gap: 4 — ABNORMAL LOW (ref 5–15)
BUN: 27 mg/dL — ABNORMAL HIGH (ref 6–20)
CALCIUM: 8.6 mg/dL — AB (ref 8.9–10.3)
CO2: 34 mmol/L — AB (ref 22–32)
CREATININE: 1.32 mg/dL — AB (ref 0.44–1.00)
Chloride: 95 mmol/L — ABNORMAL LOW (ref 101–111)
GFR calc non Af Amer: 34 mL/min — ABNORMAL LOW (ref >60)
GFR, EST AFRICAN AMERICAN: 40 mL/min — AB (ref >60)
Glucose, Bld: 101 mg/dL — ABNORMAL HIGH (ref 65–99)
Potassium: 3.6 mmol/L (ref 3.5–5.1)
SODIUM: 133 mmol/L — AB (ref 135–145)

## 2016-05-01 LAB — CBC
HCT: 39.5 % (ref 36.0–46.0)
Hemoglobin: 13.6 g/dL (ref 12.0–15.0)
MCH: 29.2 pg (ref 26.0–34.0)
MCHC: 34.4 g/dL (ref 30.0–36.0)
MCV: 84.8 fL (ref 78.0–100.0)
Platelets: 202 K/uL (ref 150–400)
RBC: 4.66 MIL/uL (ref 3.87–5.11)
RDW: 13.8 % (ref 11.5–15.5)
WBC: 11.6 K/uL — ABNORMAL HIGH (ref 4.0–10.5)

## 2016-05-01 NOTE — Discharge Summary (Signed)
Physician Discharge Summary  [name]:[id number] DOB: 10-06-1923 DOA: 04/27/2016  PCP: Michele Mcalpine, MD  Admit date: 04/27/2016 Discharge date: 05/01/2016  Time spent: 35 minutes  Recommendations for Outpatient Follow-up:  1. Suspect patient had aspiration event at her facility, esophagram performed during this hospitalization showing evidence of moderate to severe esophageal dysmotility. I spoke with GI over telephone who recommended a speech path consult at her facility.  2. Home Health orders placed for Speech and PT 3. Augmentin 875 one tablet by mouth twice a day 3 days then stop   Discharge Diagnoses:  Principal Problem:   Respiratory failure (HCC) Active Problems:   Essential hypertension   COPD (chronic obstructive pulmonary disease) (HCC)   HCAP (healthcare-associated pneumonia): Probable   CKD (chronic kidney disease), stage III   Discharge Condition: Stable  Diet recommendation: Mechanical soft  Filed Weights   04/27/16 1602  Weight: 81.7 kg (180 lb 1.9 oz)    History of present illness:  82 year old Caucasian female with history of respiratory failure. Patient was discharged from this hospital last night after treatment for respiratory failure. On discharge, patient continued to report shortness of breath, and was said to be restless all night. Repeat CXR done today reveals right lower lobe infiltrate. WBC is normal. O2 sat is in the 90's. Not clear if there is associated fever or chills. Patient has cogh that is intermittently productive of whitish phlegm, with associated running nose. No headache, no neck pain, no chest pain, no GI symptoms. Patient is said to have urinary frequency.  Hospital Course:  [name] is a 82 year old nursing home resident nursing home resident, who was recently discharged on 04/26/2016 at which time she was treated for a COPD exacerbation. A chest x-ray obtained on 04/23/2016 was interpreted as not having active disease by radiology. She even had a  CT scan of lungs without contrast that revealed stable chronic bronchitic changes, no new infiltrates noted. During that hospitalization she was treated with systemic steroids, nebulizer treatments and antibiotic therapy. She showed clinical improvement as supplemental oxygen was titrated off and she was able to ambulate down the hallway with a walker. She was discharged back to her skilled nursing facility in stable condition. Overnight she developed worsening shortness of breath and came back to the emergency department. Chest x-ray on admission showed infiltrate at right base  1. Acute respiratory failure with hypoxia -Evidenced by patient's presentation in respiratory distress and having oxygen requirement of 3 L nasal cannula to maintain saturations -I am highly suspicious that she may have had a aspiration event at her skilled nursing facility -Continue antibiotics, supplemental oxygen -Modified barium study performed on 04/28/2016 showing evidence of mild oropharyngeal dysphagia  -SLP recommending esophagram that revealed moderate to severe esophageal dysmotility  2. Aspiration pneumonia versus healthcare associated pneumonia -On a recent hospitalization she was treated for COPD exacerbation. During that hospitalization she had a chest x-ray along with a CT scan of lungs.did not reveal acute infiltrate. -During that hospitalization she got better as oxygen was titrated off. She was discharged back to her skilled nursing facility -She presented the following day with worsening shortness of breath as a chest x-ray performed in the emergency room interpreted by radiology to have a right lower lobe infiltrate. -Modified barium study performed on 04/28/2016 showing evidence of mild oropharyngeal dysphagia, speech recommending dysphagia 3 diet.  -Speech pathology recommended further workup with esophagram that revealed moderate to severe esophageal dysmotility -Repeat CXR showing RLL infiltrate  which seems stable.  Clinically she was doing much better, ambulating down the hallway with a walker.  -She was discharged on Augmentin 875 one tablet by mouth twice a day for 3 more days then stop  3. Hypertension. -Continue amlodipine 5 mg by mouth daily  4. Hyperthyroidism -She is on methimazole 5 mg every other day -Last TSH was checked on 04/12/2016, within normal limits at 3.87.   5. Stage III chronic kidney disease -On 05/01/2016 had a creatinine of 1.32 with BUN of 27, please follow renal function  6. History of borderline diabetes -Hemoglobin A1c 6.3   7. COPD exacerbation -She was recently treated for COPD exacerbation  -Will stop Prednisone today, lung exam significantly improved  Procedures:  Esophagram performed on 04/30/2016 IMPRESSION: Moderate-to-marked esophageal dysmotility.  No stricture or mass visualized.  Consultations:  Speech pathology  Discharge Exam: Filed Vitals:   04/30/16 2217 05/01/16 0604  BP: 124/84 119/76  Pulse: 73 68  Temp: 98.1 F (36.7 C) 98.4 F (36.9 C)  Resp: 16 16    General exam: She appears stable, ambulated down the hallway Respiratory system: There is some improvement to lung exam, less rhonchi Cardiovascular system: S1 & S2 heard, RRR. No JVD, murmurs, rubs, gallops or clicks. No pedal edema. Gastrointestinal system: Abdomen is nondistended, soft and nontender. No organomegaly or masses felt. Normal bowel sounds heard. Central nervous system: Alert and oriented. No focal neurological deficits. Extremities: Symmetric 5 x 5 power. Skin: No rashes, lesions or ulcers Psychiatry: Pleasantly confused, disoriented.   Discharge Instructions    Current Discharge Medication List    START taking these medications   Details  PROAIR HFA 108 (90 Base) MCG/ACT inhaler USE 1 TO 2 PUFFS EVERY SIX HOURS AS NEEDED. Qty: 8.5 g, Refills: 0      CONTINUE these medications which have NOT CHANGED   Details  acetaminophen  (TYLENOL) 325 MG tablet Take 650 mg by mouth every 4 (four) hours as needed for mild pain.    acetaZOLAMIDE (DIAMOX) 250 MG tablet TAKE (1) TABLET BY MOUTH EACH MORNING. Qty: 30 tablet, Refills: 5    ALPRAZolam (XANAX) 0.25 MG tablet Take 1 tablet (0.25 mg total) by mouth at bedtime as needed for sleep. Qty: 10 tablet, Refills: 0    amLODipine (NORVASC) 5 MG tablet Take 1 tablet (5 mg total) by mouth daily. Qty: 30 tablet, Refills: 6    aspirin EC 81 MG tablet Take 81 mg by mouth daily.    cetirizine (ZYRTEC) 10 MG tablet TAKE 1 TABLET BY MOUTH ONCE DAILY FOR ALLERGIES. Qty: 30 tablet, Refills: 5    Cholecalciferol (VITAMIN D3) 2000 UNITS capsule Take 1 capsule (2,000 Units total) by mouth daily. Qty: 30 capsule, Refills: 6    fluticasone (FLONASE) 50 MCG/ACT nasal spray SPRAY 2 SPRAYS INTO EACH NOSTRIL ONCE DAILY. Qty: 16 g, Refills: 5    Fluticasone-Salmeterol (ADVAIR DISKUS) 100-50 MCG/DOSE AEPB Inhale 1 puff into the lungs 2 (two) times daily. Qty: 60 each, Refills: 5    guaifenesin (ROBITUSSIN) 100 MG/5ML syrup Take 200 mg by mouth 3 (three) times daily as needed for cough.    ipratropium-albuterol (DUONEB) 0.5-2.5 (3) MG/3ML SOLN Take 3 mLs by nebulization every 4 (four) hours as needed. Qty: 360 mL, Refills: 0    methimazole (TAPAZOLE) 5 MG tablet Take 1 tablet (5 mg total) by mouth every other day. Qty: 30 tablet, Refills: 1    Multiple Vitamin (DAILY-VITE) TABS TAKE ONE TABLET BY MOUTH ONCE DAILY. Qty: 30 tablet, Refills: 11  ondansetron (ZOFRAN) 4 MG tablet Take 1 tablet (4 mg total) by mouth every 6 (six) hours as needed for nausea or vomiting. Qty: 15 tablet, Refills: 0    polyethylene glycol (MIRALAX / GLYCOLAX) packet Take 17 g by mouth daily as needed for mild constipation.     ranitidine (ZANTAC) 150 MG tablet TAKE ONE TABLET BY MOUTH ONCE DAILY. Qty: 30 tablet, Refills: 0    sertraline (ZOLOFT) 50 MG tablet TAKE (1) TABLET BY MOUTH AT BEDTIME. Qty: 30  tablet, Refills: 5    torsemide (DEMADEX) 20 MG tablet TAKE 1/2 TABLET BY MOUTH EVERY MORNING. Qty: 15 tablet, Refills: 11    Spacer/Aero-Holding Chambers (AEROCHAMBER PLUS WITH MASK) inhaler Use as instructed Qty: 1 each, Refills: 2       Allergies  Allergen Reactions  . Azithromycin Shortness Of Breath  . Ciprofloxacin Other (See Comments)     hallucinations  . Levofloxacin Other (See Comments)    Insomnia, indigestion, tingling sensation in legs  . Furosemide Rash    Bullous pemphigoid  . Latex Rash  . Other Rash    EKG leads caused a rash that required steroids to clear      The results of significant diagnostics from this hospitalization (including imaging, microbiology, ancillary and laboratory) are listed below for reference.    Significant Diagnostic Studies: Dg Chest 2 View  04/30/2016  CLINICAL DATA:  82 year old female with shortness of Breath. Right lung base opacity recently. Initial encounter. EXAM: CHEST  2 VIEW COMPARISON:  Chest radiographs 04/27/2016 and earlier. FINDINGS: Semi upright AP and lateral views of the chest. Chronic elevation of the right hemidiaphragm. Adjacent right lung base opacity appears unchanged since 04/27/2016. Curvilinear left lung base opacity is increased since that time. No definite associated pleural effusion. Stable cardiac size and mediastinal contours. No pneumothorax or pulmonary edema. Stable visualized osseous structures. IMPRESSION: Right greater than left lung base opacity suspicious for lung base pneumonia in this setting but might alternatively be atelectasis. No definite pleural effusion or new cardiopulmonary abnormality. 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 Electronically Signed   By: Odessa Fleming M.D.   On: 04/30/2016 12:11   Dg Chest 2 View  04/27/2016  CLINICAL DATA:  Cough and shortness of breath for 1 day EXAM: CHEST  2 VIEW COMPARISON:  Chest radiograph and chest  CT April 23, 2016 FINDINGS: There is persistent elevation of the right hemidiaphragm with patchy infiltrate in the right base. Lungs elsewhere are clear. Heart is upper normal in size with pulmonary vascularity within normal limits. No adenopathy. There is degenerative change in the thoracic spine. IMPRESSION: Elevation of the right hemidiaphragm with infiltrate right base. Lungs elsewhere clear. Stable cardiac silhouette. Electronically Signed   By: Bretta Bang III M.D.   On: 04/27/2016 09:36   Dg Chest 2 View  04/23/2016  CLINICAL DATA:  Increasing shortness of breath for 3 days. EXAM: CHEST  2 VIEW COMPARISON:  04/20/2016 FINDINGS: Stable elevation of the right hemidiaphragm. No confluent opacities or effusions. Heart is normal size. No acute bony abnormality. IMPRESSION: Elevated right hemidiaphragm, stable.  No active disease. Electronically Signed   By: Charlett Nose M.D.   On: 04/23/2016 13:52   Dg Chest 2 View  04/20/2016  CLINICAL DATA:  Wheezing for 2 days. EXAM: CHEST  2 VIEW COMPARISON:  January 11, 2016 FINDINGS: There is stable chronic elevation of the right hemidiaphragm. The interstitium is prominent in a generalized  manner, stable. There is no edema or consolidation. The heart size and pulmonary vascularity are within normal limits. There is atherosclerotic calcification in the aortic arch region. No bone lesions are evident. IMPRESSION: Stable elevation of the right hemidiaphragm. Generalized interstitial prominence remains, likely due to chronic underlying fibrotic type change. No frank edema or consolidation evident. No change in cardiac silhouette. Electronically Signed   By: Bretta Bang III M.D.   On: 04/20/2016 16:15   Ct Head Wo Contrast  04/23/2016  CLINICAL DATA:  Shortness of breath.  Confusion. EXAM: CT HEAD WITHOUT CONTRAST TECHNIQUE: Contiguous axial images were obtained from the base of the skull through the vertex without intravenous contrast. COMPARISON:  None.  FINDINGS: There is mucosal thickening in the maxillary and ethmoid sinuses with a small amount of fluid in the left maxillary sinus. Mastoid air cells and middle ears are well aerated. Bones and soft tissues are otherwise normal. Extracranial soft tissues are normal. No subdural, epidural, or subarachnoid hemorrhage. Cerebellum, brainstem, and basal cisterns are normal. No mass, mass effect, or midline shift. Ventricles and sulci are unremarkable. No acute cortical ischemia or infarct. IMPRESSION: Sinus disease as described above.  No other abnormalities. Electronically Signed   By: Gerome Sam III M.D   On: 04/23/2016 18:19   Ct Chest Wo Contrast  04/23/2016  CLINICAL DATA:  Increasing shortness of breath for several days. EXAM: CT CHEST WITHOUT CONTRAST TECHNIQUE: Multidetector CT imaging of the chest was performed following the standard protocol without IV contrast. COMPARISON:  Chest radiographs obtained earlier today. Chest CTA dated 06/01/2009. FINDINGS: Mediastinum/Lymph Nodes: Atheromatous calcifications, including the the proximal coronary arteries. No enlarged lymph nodes. Small thyroid nodules without significant change. The largest is a densely calcified nodule measuring 10 mm. Lungs/Pleura: Mild right lower lobe atelectasis and calcified granuloma. Mild left lower lobe atelectasis. Mild biapical pleural and parenchymal scarring. Peripheral inferior left upper lobe nodular and linear scarring, without significant change, measuring 7 mm on image number 63 of series 8. Diffuse peribronchial thickening without significant change. Upper abdomen: No acute findings. Musculoskeletal: Thoracic and lower cervical spine degenerative changes. Interval approximately 40% T11 vertebral body superior endplate compression deformity without bony retropulsion or acute fracture lines. Anterior spur formation at the T10-11 level. IMPRESSION: 1. Mild bilateral lower lobe atelectasis. 2. Stable chronic bronchitic  changes. 3. Stable mild multinodular thyroid. The long-term stability is compatible with a benign process. Electronically Signed   By: Beckie Salts M.D.   On: 04/23/2016 18:28   Dg Esophagus  04/30/2016  CLINICAL DATA:  Difficulty swallowing. EXAM: ESOPHOGRAM/BARIUM SWALLOW TECHNIQUE: Single contrast examination was performed using  thin barium. FLUOROSCOPY TIME:  Radiation Exposure Index (as provided by the fluoroscopic device): 24.57 mGy COMPARISON:  None. FINDINGS: Limited exam was performed with the patient in the LPO and semi-upright or indentation. The esophagus is patent. There is no stricture or mass identified. Numerous non propulsive tertiary waves were demonstrated throughout examination resulting a to and fro peristalsis of the esophageal contents. No frank aspiration identified. IMPRESSION: Moderate-to-marked esophageal dysmotility. No stricture or mass visualized. Electronically Signed   By: Signa Kell M.D.   On: 04/30/2016 13:07   Dg Abd 2 Views  04/20/2016  CLINICAL DATA:  Fatigue and decreased appetite, history of hyperlipidemia, COPD, Constipation, EXAM: ABDOMEN - 2 VIEW COMPARISON:  Abdominal and pelvic CT scan of November 26, 2007 and abdominal ultrasound dated Apr 15, 2014 FINDINGS: The small and large bowel gas pattern is within the limits of normal.  There is no free extraluminal gas observed. There is elevation of the right hemidiaphragm which is chronic. There is curvature of the thoracolumbar spine. The pedicles are intact. The patient has undergone previous right hip prosthesis placement. IMPRESSION: No acute intra-abdominal abnormality is observed on this three-view series. The lower pelvic structures are not included in the field of view. Electronically Signed   By: David  [place] M.D.   On: 04/20/2016 16:16   Dg Swallowing Func-speech Pathology  04/28/2016  Objective Swallowing Evaluation: Type of Study: MBS-Modified Barium Swallow Study Patient Details Name: KAMARI BILEK  MRN: [id number] Date of Birth: 1923-05-28 Today's Date: 04/28/2016 Time: SLP Start Time (ACUTE ONLY): 1315-SLP Stop Time (ACUTE ONLY): 1340 SLP Time Calculation (min) (ACUTE ONLY): 25 min Past Medical History: Past Medical History Diagnosis Date . Shortness of breath  . Unspecified essential hypertension  . Right bundle branch block  . Other and unspecified hyperlipidemia  . Other abnormal glucose  . Diverticulosis of colon (without mention of hemorrhage)  . Pyelonephritis, unspecified  . Solitary cyst of breast  . Osteoarthrosis, unspecified whether generalized or localized, unspecified site  . Lumbago  . Spinal stenosis, unspecified region other than cervical  . Anemia, unspecified  . GERD (gastroesophageal reflux disease)  . Pyelonephritis  . DJD (degenerative joint disease)  . UTI (lower urinary tract infection)  Past Surgical History: Past Surgical History Procedure Laterality Date . Cataract extraction   . Right hip hemiarthroplasty     total . Conversion to right thr   HPI: Patient is a 82 y.o. female who was readmitted after discharge on 6/9 after treatment for respiratory failure. Repeat CXR on 6/10 revealed right lower lobe infiltrate. PMH: GERD, SOB, spinal stenosis, COPD. Subjective: pleasant "Can I go home?" Assessment / Plan / Recommendation CHL IP CLINICAL IMPRESSIONS 04/28/2016 Therapy Diagnosis Mild oral phase dysphagia;Suspected primary esophageal dysphagia;Mild pharyngeal phase dysphagia Clinical Impression Patient presents with a mild oropharyngeal dysphagia characterized by mastication, manipulation and anterior to posterior movement delays with hard solids, with swallow initiation delay to vallecular sinus with hard solids. Thin liquids via large and small cup sips and via large and successive staw sips did not result in any penetration or aspiration. SLP observed (Radiologist not present to confirm) a mild retrograde movement of thin liquids to level of pyriform sinus and what appeared to be a  tight UES, or some restriction in UES opening. Patient was able to clear all residuals naturally, and cleared solid texture residuals with sip of thin liquids.  Impact on safety and function Mild aspiration risk   CHL IP TREATMENT RECOMMENDATION 04/28/2016 Treatment Recommendations Therapy as outlined in treatment plan below   Prognosis 04/28/2016 Prognosis for Safe Diet Advancement Good Barriers to Reach Goals -- Barriers/Prognosis Comment -- CHL IP DIET RECOMMENDATION 04/28/2016 SLP Diet Recommendations Dysphagia 3 (Mech soft) solids;Thin liquid Liquid Administration via Cup;Straw Medication Administration Whole meds with puree Compensations Slow rate;Small sips/bites Postural Changes Remain semi-upright after after feeds/meals (Comment);Seated upright at 90 degrees   CHL IP OTHER RECOMMENDATIONS 04/28/2016 Recommended Consults Consider GI evaluation Oral Care Recommendations -- Other Recommendations --   CHL IP FOLLOW UP RECOMMENDATIONS 04/28/2016 Follow up Recommendations None   CHL IP FREQUENCY AND DURATION 04/28/2016 Speech Therapy Frequency (ACUTE ONLY) min 1 x/week Treatment Duration --      CHL IP ORAL PHASE 04/28/2016 Oral Phase Impaired Oral - Pudding Teaspoon -- Oral - Pudding Cup -- Oral - Honey Teaspoon -- Oral - Honey Cup -- Oral - Nectar Teaspoon --  Oral - Nectar Cup -- Oral - Nectar Straw -- Oral - Thin Teaspoon -- Oral - Thin Cup WFL Oral - Thin Straw WFL Oral - Puree Weak lingual manipulation;Reduced posterior propulsion;Delayed oral transit Oral - Mech Soft -- Oral - Regular Weak lingual manipulation;Impaired mastication;Piecemeal swallowing;Delayed oral transit Oral - Multi-Consistency -- Oral - Pill WFL Oral Phase - Comment --  CHL IP PHARYNGEAL PHASE 04/28/2016 Pharyngeal Phase Impaired Pharyngeal- Pudding Teaspoon -- Pharyngeal -- Pharyngeal- Pudding Cup -- Pharyngeal -- Pharyngeal- Honey Teaspoon -- Pharyngeal -- Pharyngeal- Honey Cup -- Pharyngeal -- Pharyngeal- Nectar Teaspoon -- Pharyngeal --  Pharyngeal- Nectar Cup -- Pharyngeal -- Pharyngeal- Nectar Straw -- Pharyngeal -- Pharyngeal- Thin Teaspoon -- Pharyngeal -- Pharyngeal- Thin Cup WFL Pharyngeal -- Pharyngeal- Thin Straw WFL Pharyngeal -- Pharyngeal- Puree WFL Pharyngeal -- Pharyngeal- Mechanical Soft -- Pharyngeal -- Pharyngeal- Regular Delayed swallow initiation-vallecula;Pharyngeal residue - valleculae;Compensatory strategies attempted (with notebox) Pharyngeal -- Pharyngeal- Multi-consistency -- Pharyngeal -- Pharyngeal- Pill WFL Pharyngeal -- Pharyngeal Comment --  CHL IP CERVICAL ESOPHAGEAL PHASE 04/28/2016 Cervical Esophageal Phase WFL Pudding Teaspoon -- Pudding Cup -- Honey Teaspoon -- Honey Cup -- Nectar Teaspoon -- Nectar Cup -- Nectar Straw -- Thin Teaspoon -- Thin Cup -- Thin Straw -- Puree -- Mechanical Soft -- Regular -- Multi-consistency -- Pill -- Cervical Esophageal Comment -- CHL IP GO 08/13/2014 Functional Assessment Tool Used clinical judgement Functional Limitations Swallowing Swallow Current Status (W2956) CJ Swallow Goal Status (O1308) CJ Swallow Discharge Status (M5784) CJ Motor Speech Current Status (O9629) (None) Motor Speech Goal Status (B2841) (None) Motor Speech Goal Status (L2440) (None) Spoken Language Comprehension Current Status (N0272) (None) Spoken Language Comprehension Goal Status (Z3664) (None) Spoken Language Comprehension Discharge Status (Q0347) (None) Spoken Language Expression Current Status (Q2595) (None) Spoken Language Expression Goal Status (G3875) (None) Spoken Language Expression Discharge Status [phone]) (None) Attention Current Status (R5188) (None) Attention Goal Status (C1660) (None) Attention Discharge Status (Y3016) (None) Memory Current Status (W1093) (None) Memory Goal Status (A3557) (None) Memory Discharge Status (D2202) (None) Voice Current Status (R4270) (None) Voice Goal Status (W2376) (None) Voice Discharge Status (E8315) (None) Other Speech-Language Pathology Functional Limitation [phone])  (None) Other Speech-Language Pathology Functional Limitation Goal Status (W7371) (None) Other Speech-Language Pathology Functional Limitation Discharge Status [phone]) (None) Elio Forget Tarrell 04/28/2016, 3:05 PM    Angela Nevin, MA, CCC-SLP 04/28/2016 3:06 PM            Microbiology: Recent Results (from the past 240 hour(s))  Culture, Urine     Status: Abnormal   Collection Time: 04/23/16  4:03 PM  Result Value Ref Range Status   Specimen Description URINE, CLEAN CATCH  Final   Special Requests NONE  Final   Culture >=100,000 COLONIES/mL KLEBSIELLA PNEUMONIAE (A)  Final   Report Status 04/26/2016 FINAL  Final   Organism ID, Bacteria KLEBSIELLA PNEUMONIAE (A)  Final      Susceptibility   Klebsiella pneumoniae - MIC*    AMPICILLIN 16 RESISTANT Resistant     CEFAZOLIN <=4 SENSITIVE Sensitive     CEFTRIAXONE <=1 SENSITIVE Sensitive     CIPROFLOXACIN <=0.25 SENSITIVE Sensitive     GENTAMICIN <=1 SENSITIVE Sensitive     IMIPENEM <=0.25 SENSITIVE Sensitive     NITROFURANTOIN <=16 SENSITIVE Sensitive     TRIMETH/SULFA <=20 SENSITIVE Sensitive     AMPICILLIN/SULBACTAM 4 SENSITIVE Sensitive     PIP/TAZO <=4 SENSITIVE Sensitive     * >=100,000 COLONIES/mL KLEBSIELLA PNEUMONIAE  Culture, blood (routine x 2) Call MD if unable to  obtain prior to antibiotics being given     Status: None   Collection Time: 04/23/16  5:50 PM  Result Value Ref Range Status   Specimen Description BLOOD RIGHT HAND  Final   Special Requests BOTTLES DRAWN AEROBIC ONLY 5CC  Final   Culture   Final    NO GROWTH 5 DAYS Performed at [facility]    Report Status 04/28/2016 FINAL  Final  Culture, blood (routine x 2) Call MD if unable to obtain prior to antibiotics being given     Status: None   Collection Time: 04/23/16  5:51 PM  Result Value Ref Range Status   Specimen Description BLOOD RIGHT WRIST  Final   Special Requests BOTTLES DRAWN AEROBIC AND ANAEROBIC 5CC  Final   Culture   Final    NO GROWTH 5  DAYS Performed at [facility]    Report Status 04/28/2016 FINAL  Final  MRSA PCR Screening     Status: None   Collection Time: 04/23/16  6:42 PM  Result Value Ref Range Status   MRSA by PCR NEGATIVE NEGATIVE Final    Comment:        The GeneXpert MRSA Assay (FDA approved for NASAL specimens only), is one component of a comprehensive MRSA colonization surveillance program. It is not intended to diagnose MRSA infection nor to guide or monitor treatment for MRSA infections.   Urine culture     Status: Abnormal   Collection Time: 04/27/16 11:27 AM  Result Value Ref Range Status   Specimen Description URINE, RANDOM  Final   Special Requests NONE  Final   Culture (A)  Final    <10,000 COLONIES/mL INSIGNIFICANT GROWTH [facility] [facility]    Report Status 04/29/2016 FINAL  Final     Labs: Basic Metabolic Panel:  Recent Labs Lab 04/27/16 0924 04/27/16 1427 04/28/16 0153 04/29/16 0512 04/30/16 0429 05/01/16 0453  NA 134*  --  131* 131* 131* 133*  K 3.8  --  3.8 4.4 3.9 3.6  CL 101  --  96* 97* 93* 95*  CO2 26  --  27 26 29  34*  GLUCOSE 101*  --  196* 139* 141* 101*  BUN 21*  --  20 27* 34* 27*  CREATININE 0.82 0.81 0.98 1.21* 1.57* 1.32*  CALCIUM 9.2  --  8.8* 8.9 8.8* 8.6*   Liver Function Tests: No results for input(s): AST, ALT, ALKPHOS, BILITOT, PROT, ALBUMIN in the last 168 hours. No results for input(s): LIPASE, AMYLASE in the last 168 hours. No results for input(s): AMMONIA in the last 168 hours. CBC:  Recent Labs Lab 04/27/16 0924 04/27/16 1427 04/28/16 0153 04/29/16 0512 04/30/16 0429 05/01/16 0453  WBC 10.4 9.2 9.2 10.9* 14.6* 11.6*  NEUTROABS 7.0  --   --   --   --   --   HGB 14.3 14.8 15.0 15.2* 14.2 13.6  HCT 43.2 43.7 42.7 43.1 41.4 39.5  MCV 87.8 87.4 83.9 84.7 85.4 84.8  PLT 190 225 189 218 204 202   Cardiac Enzymes:  Recent Labs Lab 04/27/16 1427 04/27/16 2027 04/28/16 0153  TROPONINI <0.03 <0.03 <0.03    BNP: BNP (last 3 results)  Recent Labs  06/12/15 1648 04/23/16 1405 04/27/16 1427  BNP 55.2 34.7 117.1*    ProBNP (last 3 results)  Recent Labs  08/09/15 1047  PROBNP 72.0    CBG:  Recent Labs Lab 04/25/16 1158 04/25/16 1703 04/25/16 2156 04/26/16 0735 04/26/16 1111  GLUCAP  173* 119* 139* 115* 255*       Signed:  Jeralyn BennettZAMORA, Everlee Quakenbush MD.  [facility] 05/01/2016, 12:24 PM

## 2016-05-01 NOTE — Progress Notes (Signed)
Patient is set to discharge back to [facility] today. CSW confirmed with Arline AspCindy at [facility] that patient is ok to return. Patient & daughter, [name] at bedside made aware. Discharge packet given to RN, Susie. Daughter to transport back to ALF.     Lincoln MaxinKelly Judithe Keetch, LCSW [facility] Clinical Social Worker cell #: [phone]

## 2016-05-01 NOTE — Consult Note (Signed)
   [facility]   05/01/2016  Karleen DolphinMargaret J Delisi 10/18/1923 [id number]   Spoke with inpatient Licensed CSW who indicated [facility] as she was a readmission. Spoke with patient and family at bedside to offer and explain [facility]. Ms. [name] states she feels like she is covered at the ALF and does not need additional services from [facility]. Patient's daughter states that the only complaint they have is the food and that she is going to address what the patient needs to eat and have at the facility. Left [facility] brochure and contact information to call if they should change their mind. Will make inpatient Licensed CSW aware that Ms. Weinhold declined.  Raiford NobleAtika Reily Treloar, MSN-Ed, RN,BSN [facility] Hospital Liaison [phone]

## 2016-05-01 NOTE — Progress Notes (Signed)
SATURATION QUALIFICATIONS: (This note is used to comply with regulatory documentation for home oxygen)  Patient Saturations on Room Air at Rest = 92%  Patient Saturations on Room Air while Ambulating = 89%  Patient Saturations on 2 Liters of oxygen while Ambulating = 93%  Please briefly explain why patient needs home oxygen: Oxygen drops while ambulating

## 2016-05-01 NOTE — Discharge Summary (Deleted)
Physician Discharge Summary  Karleen DolphinMargaret J Meckel ZOX:[id number] DOB: Mar 24, 1923 DOA: 04/23/2016  PCP: Michele McalpineNADEL,SCOTT M, MD  Admit date: 04/23/2016 Discharge date: 05/01/2016  Time spent: 35 minutes  Recommendations for Outpatient Follow-up:  1. Suspect patient had aspiration event at her facility, esophagram performed during this hospitalization showing evidence of moderate to severe esophageal dysmotility. I spoke with GI over telephone who recommended a speech path consult at her facility.  2. Home Health orders placed for Speech and PT 3. Augmentin 875 one tablet by mouth twice a day 3 days then stop   Discharge Diagnoses:  Principal Problem:   Acute respiratory failure with hypoxia (HCC) Active Problems:   ANEMIA   Essential hypertension   Constipation   Spinal stenosis of lumbar region   LOW BACK PAIN SYNDROME   DYSPNEA   DIABETES MELLITUS, BORDERLINE   Anxiety   GERD (gastroesophageal reflux disease)   Chronic venous insufficiency   COPD (chronic obstructive pulmonary disease) (HCC)   Thyrotoxicosis   Allergic rhinitis   HCAP (healthcare-associated pneumonia): Probable   COPD exacerbation (HCC)   Dehydration   CKD (chronic kidney disease), stage III   Pressure ulcer   Hypoxia   Stage 1 decubitus ulcer   Discharge Condition: Stable  Diet recommendation: Mechanical soft  Filed Weights   04/23/16 1843 04/25/16 0500 04/26/16 0500  Weight: 80.287 kg (177 lb) 81.557 kg (179 lb 12.8 oz) 83.099 kg (183 lb 3.2 oz)    History of present illness:  82 year old Caucasian female with history of respiratory failure. Patient was discharged from this hospital last night after treatment for respiratory failure. On discharge, patient continued to report shortness of breath, and was said to be restless all night. Repeat CXR done today reveals right lower lobe infiltrate. WBC is normal. O2 sat is in the 90's. Not clear if there is associated fever or chills. Patient has cogh that is  intermittently productive of whitish phlegm, with associated running nose. No headache, no neck pain, no chest pain, no GI symptoms. Patient is said to have urinary frequency.  Hospital Course:  Mrs [name] is a 82 year old nursing home resident, who was recently discharged on 04/26/2016 at which time she was treated for a COPD exacerbation. A chest x-ray obtained on 04/23/2016 was interpreted as not having active disease by radiology. She even had a CT scan of lungs without contrast that revealed stable chronic bronchitic changes, no new infiltrates noted. During that hospitalization she was treated with systemic steroids, nebulizer treatments and antibiotic therapy. She showed clinical improvement as supplemental oxygen was titrated off and she was able to ambulate down the hallway with a walker. She was discharged back to her skilled nursing facility in stable condition. Overnight she developed worsening shortness of breath and came back to the emergency department. Chest x-ray on admission showed infiltrate at right base  1. Acute respiratory failure with hypoxia -Evidenced by patient's presentation in respiratory distress and having oxygen requirement of 3 L nasal cannula to maintain saturations -I am highly suspicious that she may have had a aspiration event at her skilled nursing facility -Continue antibiotics, supplemental oxygen -Modified barium study performed on 04/28/2016 showing evidence of mild oropharyngeal dysphagia  -SLP recommending esophagram that revealed moderate to severe esophageal dysmotility  2. Aspiration pneumonia versus healthcare associated pneumonia -On a recent hospitalization she was treated for COPD exacerbation. During that hospitalization she had a chest x-ray along with a CT scan of lungs.did not reveal acute infiltrate. -During that hospitalization she  got better as oxygen was titrated off. She was discharged back to her skilled nursing facility -She presented the  following day with worsening shortness of breath as a chest x-ray performed in the emergency room interpreted by radiology to have a right lower lobe infiltrate. -Modified barium study performed on 04/28/2016 showing evidence of mild oropharyngeal dysphagia, speech recommending dysphagia 3 diet.  -Speech pathology recommended further workup with esophagram that revealed moderate to severe esophageal dysmotility -Repeat CXR showing RLL infiltrate which seems stable. Clinically she was doing much better, ambulating down the hallway with a walker.  -She was discharged on Augmentin 875 one tablet by mouth twice a day for 3 more days then stop  3. Hypertension. -Continue amlodipine 5 mg by mouth daily  4. Hyperthyroidism -She is on methimazole 5 mg every other day -Last TSH was checked on 04/12/2016, within normal limits at 3.87.   5. Stage III chronic kidney disease -On 05/01/2016 had a creatinine of 1.32 with BUN of 27, please follow renal function  6. History of borderline diabetes -Hemoglobin A1c 6.3   7. COPD exacerbation -She was recently treated for COPD exacerbation  -Will stop Prednisone today, lung exam significantly improved  Procedures:  Esophagram performed on 04/30/2016 IMPRESSION: Moderate-to-marked esophageal dysmotility.  No stricture or mass visualized.  Consultations:  Speech pathology  Discharge Exam: Filed Vitals:   04/25/16 2157 04/26/16 0500  BP: 139/52 156/70  Pulse: 72 65  Temp: 98 F (36.7 C) 98.2 F (36.8 C)  Resp: 20 20    General exam: She appears stable, ambulated down the hallway Respiratory system: There is some improvement to lung exam, less rhonchi Cardiovascular system: S1 & S2 heard, RRR. No JVD, murmurs, rubs, gallops or clicks. No pedal edema. Gastrointestinal system: Abdomen is nondistended, soft and nontender. No organomegaly or masses felt. Normal bowel sounds heard. Central nervous system: Alert and oriented. No focal  neurological deficits. Extremities: Symmetric 5 x 5 power. Skin: No rashes, lesions or ulcers Psychiatry: Pleasantly confused, disoriented.   Discharge Instructions   Discharge Instructions    Call MD for:  difficulty breathing, headache or visual disturbances    Complete by:  As directed      Call MD for:  difficulty breathing, headache or visual disturbances    Complete by:  As directed      Call MD for:  extreme fatigue    Complete by:  As directed      Call MD for:  extreme fatigue    Complete by:  As directed      Call MD for:  hives    Complete by:  As directed      Call MD for:  hives    Complete by:  As directed      Call MD for:  persistant dizziness or light-headedness    Complete by:  As directed      Call MD for:  persistant dizziness or light-headedness    Complete by:  As directed      Call MD for:  persistant nausea and vomiting    Complete by:  As directed      Call MD for:  persistant nausea and vomiting    Complete by:  As directed      Call MD for:  redness, tenderness, or signs of infection (pain, swelling, redness, odor or green/yellow discharge around incision site)    Complete by:  As directed      Call MD for:  redness, tenderness, or signs  of infection (pain, swelling, redness, odor or green/yellow discharge around incision site)    Complete by:  As directed      Call MD for:  severe uncontrolled pain    Complete by:  As directed      Call MD for:  severe uncontrolled pain    Complete by:  As directed      Call MD for:  temperature >100.4    Complete by:  As directed      Call MD for:  temperature >100.4    Complete by:  As directed      Call MD for:    Complete by:  As directed      Call MD for:    Complete by:  As directed      Diet - low sodium heart healthy    Complete by:  As directed      Diet - low sodium heart healthy    Complete by:  As directed      Increase activity slowly    Complete by:  As directed      Increase activity slowly     Complete by:  As directed           Discharge Medication List as of 04/26/2016  6:25 PM    CONTINUE these medications which have CHANGED   Details  ALPRAZolam (XANAX) 0.25 MG tablet Take 1 tablet (0.25 mg total) by mouth at bedtime as needed for sleep., Starting 04/26/2016, Until Discontinued, Print    Fluticasone-Salmeterol (ADVAIR DISKUS) 100-50 MCG/DOSE AEPB Inhale 1 puff into the lungs 2 (two) times daily., Starting 04/26/2016, Until Discontinued, Print    ipratropium-albuterol (DUONEB) 0.5-2.5 (3) MG/3ML SOLN Take 3 mLs by nebulization every 4 (four) hours as needed., Starting 04/26/2016, Until Discontinued, Print    doxycycline (VIBRA-TABS) 100 MG tablet Take 1 tablet (100 mg total) by mouth 2 (two) times daily., Starting 04/26/2016, Until Discontinued, Print    predniSONE (STERAPRED UNI-PAK 21 TAB) 10 MG (21) TBPK tablet Take 6-5-4-3-2-1 tablets by mouth daily till gone., Print      CONTINUE these medications which have NOT CHANGED   Details  acetaZOLAMIDE (DIAMOX) 250 MG tablet TAKE (1) TABLET BY MOUTH EACH MORNING., Normal    amLODipine (NORVASC) 5 MG tablet Take 1 tablet (5 mg total) by mouth daily., Starting 03/23/2016, Until Discontinued, Phone In    aspirin EC 81 MG tablet Take 81 mg by mouth daily., Until Discontinued, Historical Med    cetirizine (ZYRTEC) 10 MG tablet TAKE 1 TABLET BY MOUTH ONCE DAILY FOR ALLERGIES., Normal    Cholecalciferol (VITAMIN D3) 2000 UNITS capsule Take 1 capsule (2,000 Units total) by mouth daily., Starting 08/16/2015, Until Discontinued, Print    fluticasone (FLONASE) 50 MCG/ACT nasal spray SPRAY 2 SPRAYS INTO EACH NOSTRIL ONCE DAILY., Normal    guaifenesin (ROBITUSSIN) 100 MG/5ML syrup Take 200 mg by mouth 3 (three) times daily as needed for cough., Until Discontinued, Historical Med    methimazole (TAPAZOLE) 5 MG tablet Take 1 tablet (5 mg total) by mouth every other day., Starting 03/01/2016, Until Discontinued, Normal    Multiple Vitamin  (DAILY-VITE) TABS TAKE ONE TABLET BY MOUTH ONCE DAILY., Normal    ondansetron (ZOFRAN) 4 MG tablet Take 1 tablet (4 mg total) by mouth every 6 (six) hours as needed for nausea or vomiting., Starting 04/20/2016, Until Discontinued, Normal    polyethylene glycol (MIRALAX / GLYCOLAX) packet Take 17 g by mouth daily as needed for mild constipation. , Until  Discontinued, Historical Med    ranitidine (ZANTAC) 150 MG tablet TAKE ONE TABLET BY MOUTH ONCE DAILY., Normal    sertraline (ZOLOFT) 50 MG tablet TAKE (1) TABLET BY MOUTH AT BEDTIME., Phone In    Spacer/Aero-Holding Chambers (AEROCHAMBER PLUS WITH MASK) inhaler Use as instructed, Print    torsemide (DEMADEX) 20 MG tablet TAKE 1/2 TABLET BY MOUTH EVERY MORNING., Normal    PROAIR HFA 108 (90 BASE) MCG/ACT inhaler INHALE 2 PUFFS EVERY 4 HOURS AS NEEDED, Normal      STOP taking these medications     DELSYM 30 MG/5ML liquid      potassium chloride (K-DUR) 10 MEQ tablet      Q-NOL 325 MG tablet        Allergies  Allergen Reactions  . Azithromycin Shortness Of Breath  . Ciprofloxacin Other (See Comments)     hallucinations  . Levofloxacin Other (See Comments)    Insomnia, indigestion, tingling sensation in legs  . Furosemide Rash    Bullous pemphigoid  . Latex Rash  . Other Rash    EKG leads caused a rash that required steroids to clear   Follow-up Information    Follow up with NADEL,SCOTT M, MD In 1 week.   Specialty:  Pulmonary Disease   Contact information:   [address] [place] [address] [phone]        The results of significant diagnostics from this hospitalization (including imaging, microbiology, ancillary and laboratory) are listed below for reference.    Significant Diagnostic Studies: Dg Chest 2 View  04/30/2016  CLINICAL DATA:  82 year old female with shortness of Breath. Right lung base opacity recently. Initial encounter. EXAM: CHEST  2 VIEW COMPARISON:  Chest radiographs 04/27/2016 and earlier.  FINDINGS: Semi upright AP and lateral views of the chest. Chronic elevation of the right hemidiaphragm. Adjacent right lung base opacity appears unchanged since 04/27/2016. Curvilinear left lung base opacity is increased since that time. No definite associated pleural effusion. Stable cardiac size and mediastinal contours. No pneumothorax or pulmonary edema. Stable visualized osseous structures. IMPRESSION: Right greater than left lung base opacity suspicious for lung base pneumonia in this setting but might alternatively be atelectasis. No definite pleural effusion or new cardiopulmonary abnormality. 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 Electronically Signed   By: Odessa Fleming M.D.   On: 04/30/2016 12:11   Dg Chest 2 View  04/27/2016  CLINICAL DATA:  Cough and shortness of breath for 1 day EXAM: CHEST  2 VIEW COMPARISON:  Chest radiograph and chest CT April 23, 2016 FINDINGS: There is persistent elevation of the right hemidiaphragm with patchy infiltrate in the right base. Lungs elsewhere are clear. Heart is upper normal in size with pulmonary vascularity within normal limits. No adenopathy. There is degenerative change in the thoracic spine. IMPRESSION: Elevation of the right hemidiaphragm with infiltrate right base. Lungs elsewhere clear. Stable cardiac silhouette. Electronically Signed   By: Bretta Bang III M.D.   On: 04/27/2016 09:36   Dg Chest 2 View  04/23/2016  CLINICAL DATA:  Increasing shortness of breath for 3 days. EXAM: CHEST  2 VIEW COMPARISON:  04/20/2016 FINDINGS: Stable elevation of the right hemidiaphragm. No confluent opacities or effusions. Heart is normal size. No acute bony abnormality. IMPRESSION: Elevated right hemidiaphragm, stable.  No active disease. Electronically Signed   By: Charlett Nose M.D.   On: 04/23/2016 13:52   Dg Chest 2 View  04/20/2016  CLINICAL  DATA:  Wheezing for 2 days. EXAM: CHEST  2 VIEW COMPARISON:   January 11, 2016 FINDINGS: There is stable chronic elevation of the right hemidiaphragm. The interstitium is prominent in a generalized manner, stable. There is no edema or consolidation. The heart size and pulmonary vascularity are within normal limits. There is atherosclerotic calcification in the aortic arch region. No bone lesions are evident. IMPRESSION: Stable elevation of the right hemidiaphragm. Generalized interstitial prominence remains, likely due to chronic underlying fibrotic type change. No frank edema or consolidation evident. No change in cardiac silhouette. Electronically Signed   By: Bretta Bang III M.D.   On: 04/20/2016 16:15   Ct Head Wo Contrast  04/23/2016  CLINICAL DATA:  Shortness of breath.  Confusion. EXAM: CT HEAD WITHOUT CONTRAST TECHNIQUE: Contiguous axial images were obtained from the base of the skull through the vertex without intravenous contrast. COMPARISON:  None. FINDINGS: There is mucosal thickening in the maxillary and ethmoid sinuses with a small amount of fluid in the left maxillary sinus. Mastoid air cells and middle ears are well aerated. Bones and soft tissues are otherwise normal. Extracranial soft tissues are normal. No subdural, epidural, or subarachnoid hemorrhage. Cerebellum, brainstem, and basal cisterns are normal. No mass, mass effect, or midline shift. Ventricles and sulci are unremarkable. No acute cortical ischemia or infarct. IMPRESSION: Sinus disease as described above.  No other abnormalities. Electronically Signed   By: Gerome Sam III M.D   On: 04/23/2016 18:19   Ct Chest Wo Contrast  04/23/2016  CLINICAL DATA:  Increasing shortness of breath for several days. EXAM: CT CHEST WITHOUT CONTRAST TECHNIQUE: Multidetector CT imaging of the chest was performed following the standard protocol without IV contrast. COMPARISON:  Chest radiographs obtained earlier today. Chest CTA dated 06/01/2009. FINDINGS: Mediastinum/Lymph Nodes: Atheromatous  calcifications, including the the proximal coronary arteries. No enlarged lymph nodes. Small thyroid nodules without significant change. The largest is a densely calcified nodule measuring 10 mm. Lungs/Pleura: Mild right lower lobe atelectasis and calcified granuloma. Mild left lower lobe atelectasis. Mild biapical pleural and parenchymal scarring. Peripheral inferior left upper lobe nodular and linear scarring, without significant change, measuring 7 mm on image number 63 of series 8. Diffuse peribronchial thickening without significant change. Upper abdomen: No acute findings. Musculoskeletal: Thoracic and lower cervical spine degenerative changes. Interval approximately 40% T11 vertebral body superior endplate compression deformity without bony retropulsion or acute fracture lines. Anterior spur formation at the T10-11 level. IMPRESSION: 1. Mild bilateral lower lobe atelectasis. 2. Stable chronic bronchitic changes. 3. Stable mild multinodular thyroid. The long-term stability is compatible with a benign process. Electronically Signed   By: Beckie Salts M.D.   On: 04/23/2016 18:28   Dg Esophagus  04/30/2016  CLINICAL DATA:  Difficulty swallowing. EXAM: ESOPHOGRAM/BARIUM SWALLOW TECHNIQUE: Single contrast examination was performed using  thin barium. FLUOROSCOPY TIME:  Radiation Exposure Index (as provided by the fluoroscopic device): 24.57 mGy COMPARISON:  None. FINDINGS: Limited exam was performed with the patient in the LPO and semi-upright or indentation. The esophagus is patent. There is no stricture or mass identified. Numerous non propulsive tertiary waves were demonstrated throughout examination resulting a to and fro peristalsis of the esophageal contents. No frank aspiration identified. IMPRESSION: Moderate-to-marked esophageal dysmotility. No stricture or mass visualized. Electronically Signed   By: Signa Kell M.D.   On: 04/30/2016 13:07   Dg Abd 2 Views  04/20/2016  CLINICAL DATA:  Fatigue and  decreased appetite, history of hyperlipidemia, COPD, Constipation, EXAM: ABDOMEN -  2 VIEW COMPARISON:  Abdominal and pelvic CT scan of November 26, 2007 and abdominal ultrasound dated Apr 15, 2014 FINDINGS: The small and large bowel gas pattern is within the limits of normal. There is no free extraluminal gas observed. There is elevation of the right hemidiaphragm which is chronic. There is curvature of the thoracolumbar spine. The pedicles are intact. The patient has undergone previous right hip prosthesis placement. IMPRESSION: No acute intra-abdominal abnormality is observed on this three-view series. The lower pelvic structures are not included in the field of view. Electronically Signed   By: David  [place] M.D.   On: 04/20/2016 16:16   Dg Swallowing Func-speech Pathology  04/28/2016  Objective Swallowing Evaluation: Type of Study: MBS-Modified Barium Swallow Study Patient Details Name: JACKQULINE BRANCA MRN: [id number] Date of Birth: 15-Dec-1922 Today's Date: 04/28/2016 Time: SLP Start Time (ACUTE ONLY): 1315-SLP Stop Time (ACUTE ONLY): 1340 SLP Time Calculation (min) (ACUTE ONLY): 25 min Past Medical History: Past Medical History Diagnosis Date . Shortness of breath  . Unspecified essential hypertension  . Right bundle branch block  . Other and unspecified hyperlipidemia  . Other abnormal glucose  . Diverticulosis of colon (without mention of hemorrhage)  . Pyelonephritis, unspecified  . Solitary cyst of breast  . Osteoarthrosis, unspecified whether generalized or localized, unspecified site  . Lumbago  . Spinal stenosis, unspecified region other than cervical  . Anemia, unspecified  . GERD (gastroesophageal reflux disease)  . Pyelonephritis  . DJD (degenerative joint disease)  . UTI (lower urinary tract infection)  Past Surgical History: Past Surgical History Procedure Laterality Date . Cataract extraction   . Right hip hemiarthroplasty     total . Conversion to right thr   HPI: Patient is a 82 y.o. female who  was readmitted after discharge on 6/9 after treatment for respiratory failure. Repeat CXR on 6/10 revealed right lower lobe infiltrate. PMH: GERD, SOB, spinal stenosis, COPD. Subjective: pleasant "Can I go home?" Assessment / Plan / Recommendation CHL IP CLINICAL IMPRESSIONS 04/28/2016 Therapy Diagnosis Mild oral phase dysphagia;Suspected primary esophageal dysphagia;Mild pharyngeal phase dysphagia Clinical Impression Patient presents with a mild oropharyngeal dysphagia characterized by mastication, manipulation and anterior to posterior movement delays with hard solids, with swallow initiation delay to vallecular sinus with hard solids. Thin liquids via large and small cup sips and via large and successive staw sips did not result in any penetration or aspiration. SLP observed (Radiologist not present to confirm) a mild retrograde movement of thin liquids to level of pyriform sinus and what appeared to be a tight UES, or some restriction in UES opening. Patient was able to clear all residuals naturally, and cleared solid texture residuals with sip of thin liquids.  Impact on safety and function Mild aspiration risk   CHL IP TREATMENT RECOMMENDATION 04/28/2016 Treatment Recommendations Therapy as outlined in treatment plan below   Prognosis 04/28/2016 Prognosis for Safe Diet Advancement Good Barriers to Reach Goals -- Barriers/Prognosis Comment -- CHL IP DIET RECOMMENDATION 04/28/2016 SLP Diet Recommendations Dysphagia 3 (Mech soft) solids;Thin liquid Liquid Administration via Cup;Straw Medication Administration Whole meds with puree Compensations Slow rate;Small sips/bites Postural Changes Remain semi-upright after after feeds/meals (Comment);Seated upright at 90 degrees   CHL IP OTHER RECOMMENDATIONS 04/28/2016 Recommended Consults Consider GI evaluation Oral Care Recommendations -- Other Recommendations --   CHL IP FOLLOW UP RECOMMENDATIONS 04/28/2016 Follow up Recommendations None   CHL IP FREQUENCY AND DURATION  04/28/2016 Speech Therapy Frequency (ACUTE ONLY) min 1 x/week Treatment Duration --  CHL IP ORAL PHASE 04/28/2016 Oral Phase Impaired Oral - Pudding Teaspoon -- Oral - Pudding Cup -- Oral - Honey Teaspoon -- Oral - Honey Cup -- Oral - Nectar Teaspoon -- Oral - Nectar Cup -- Oral - Nectar Straw -- Oral - Thin Teaspoon -- Oral - Thin Cup WFL Oral - Thin Straw WFL Oral - Puree Weak lingual manipulation;Reduced posterior propulsion;Delayed oral transit Oral - Mech Soft -- Oral - Regular Weak lingual manipulation;Impaired mastication;Piecemeal swallowing;Delayed oral transit Oral - Multi-Consistency -- Oral - Pill WFL Oral Phase - Comment --  CHL IP PHARYNGEAL PHASE 04/28/2016 Pharyngeal Phase Impaired Pharyngeal- Pudding Teaspoon -- Pharyngeal -- Pharyngeal- Pudding Cup -- Pharyngeal -- Pharyngeal- Honey Teaspoon -- Pharyngeal -- Pharyngeal- Honey Cup -- Pharyngeal -- Pharyngeal- Nectar Teaspoon -- Pharyngeal -- Pharyngeal- Nectar Cup -- Pharyngeal -- Pharyngeal- Nectar Straw -- Pharyngeal -- Pharyngeal- Thin Teaspoon -- Pharyngeal -- Pharyngeal- Thin Cup WFL Pharyngeal -- Pharyngeal- Thin Straw WFL Pharyngeal -- Pharyngeal- Puree WFL Pharyngeal -- Pharyngeal- Mechanical Soft -- Pharyngeal -- Pharyngeal- Regular Delayed swallow initiation-vallecula;Pharyngeal residue - valleculae;Compensatory strategies attempted (with notebox) Pharyngeal -- Pharyngeal- Multi-consistency -- Pharyngeal -- Pharyngeal- Pill WFL Pharyngeal -- Pharyngeal Comment --  CHL IP CERVICAL ESOPHAGEAL PHASE 04/28/2016 Cervical Esophageal Phase WFL Pudding Teaspoon -- Pudding Cup -- Honey Teaspoon -- Honey Cup -- Nectar Teaspoon -- Nectar Cup -- Nectar Straw -- Thin Teaspoon -- Thin Cup -- Thin Straw -- Puree -- Mechanical Soft -- Regular -- Multi-consistency -- Pill -- Cervical Esophageal Comment -- CHL IP GO 08/13/2014 Functional Assessment Tool Used clinical judgement Functional Limitations Swallowing Swallow Current Status (Z6109) CJ Swallow Goal  Status (U0454) CJ Swallow Discharge Status (U9811) CJ Motor Speech Current Status (B1478) (None) Motor Speech Goal Status (G9562) (None) Motor Speech Goal Status (Z3086) (None) Spoken Language Comprehension Current Status (V7846) (None) Spoken Language Comprehension Goal Status (N6295) (None) Spoken Language Comprehension Discharge Status (M8413) (None) Spoken Language Expression Current Status (K4401) (None) Spoken Language Expression Goal Status (U2725) (None) Spoken Language Expression Discharge Status [phone]) (None) Attention Current Status (I3474) (None) Attention Goal Status (Q5956) (None) Attention Discharge Status (L8756) (None) Memory Current Status (E3329) (None) Memory Goal Status (J1884) (None) Memory Discharge Status (Z6606) (None) Voice Current Status (T0160) (None) Voice Goal Status (F0932) (None) Voice Discharge Status (T5573) (None) Other Speech-Language Pathology Functional Limitation [phone]) (None) Other Speech-Language Pathology Functional Limitation Goal Status (K2706) (None) Other Speech-Language Pathology Functional Limitation Discharge Status [phone]) (None) Elio Forget Tarrell 04/28/2016, 3:05 PM    Angela Nevin, MA, CCC-SLP 04/28/2016 3:06 PM            Microbiology: Recent Results (from the past 240 hour(s))  Culture, Urine     Status: Abnormal   Collection Time: 04/23/16  4:03 PM  Result Value Ref Range Status   Specimen Description URINE, CLEAN CATCH  Final   Special Requests NONE  Final   Culture >=100,000 COLONIES/mL KLEBSIELLA PNEUMONIAE (A)  Final   Report Status 04/26/2016 FINAL  Final   Organism ID, Bacteria KLEBSIELLA PNEUMONIAE (A)  Final      Susceptibility   Klebsiella pneumoniae - MIC*    AMPICILLIN 16 RESISTANT Resistant     CEFAZOLIN <=4 SENSITIVE Sensitive     CEFTRIAXONE <=1 SENSITIVE Sensitive     CIPROFLOXACIN <=0.25 SENSITIVE Sensitive     GENTAMICIN <=1 SENSITIVE Sensitive     IMIPENEM <=0.25 SENSITIVE Sensitive     NITROFURANTOIN <=16 SENSITIVE  Sensitive     TRIMETH/SULFA <=20 SENSITIVE Sensitive  AMPICILLIN/SULBACTAM 4 SENSITIVE Sensitive     PIP/TAZO <=4 SENSITIVE Sensitive     * >=100,000 COLONIES/mL KLEBSIELLA PNEUMONIAE  Culture, blood (routine x 2) Call MD if unable to obtain prior to antibiotics being given     Status: None   Collection Time: 04/23/16  5:50 PM  Result Value Ref Range Status   Specimen Description BLOOD RIGHT HAND  Final   Special Requests BOTTLES DRAWN AEROBIC ONLY 5CC  Final   Culture   Final    NO GROWTH 5 DAYS Performed at [facility]    Report Status 04/28/2016 FINAL  Final  Culture, blood (routine x 2) Call MD if unable to obtain prior to antibiotics being given     Status: None   Collection Time: 04/23/16  5:51 PM  Result Value Ref Range Status   Specimen Description BLOOD RIGHT WRIST  Final   Special Requests BOTTLES DRAWN AEROBIC AND ANAEROBIC 5CC  Final   Culture   Final    NO GROWTH 5 DAYS Performed at [facility]    Report Status 04/28/2016 FINAL  Final  MRSA PCR Screening     Status: None   Collection Time: 04/23/16  6:42 PM  Result Value Ref Range Status   MRSA by PCR NEGATIVE NEGATIVE Final    Comment:        The GeneXpert MRSA Assay (FDA approved for NASAL specimens only), is one component of a comprehensive MRSA colonization surveillance program. It is not intended to diagnose MRSA infection nor to guide or monitor treatment for MRSA infections.   Urine culture     Status: Abnormal   Collection Time: 04/27/16 11:27 AM  Result Value Ref Range Status   Specimen Description URINE, RANDOM  Final   Special Requests NONE  Final   Culture (A)  Final    <10,000 COLONIES/mL INSIGNIFICANT GROWTH Performed at [facility]    Report Status 04/29/2016 FINAL  Final     Labs: Basic Metabolic Panel:  Recent Labs Lab 04/27/16 0924 04/27/16 1427 04/28/16 0153 04/29/16 0512 04/30/16 0429 05/01/16 0453  NA 134*  --  131* 131* 131* 133*  K 3.8  --   3.8 4.4 3.9 3.6  CL 101  --  96* 97* 93* 95*  CO2 26  --  27 26 29  34*  GLUCOSE 101*  --  196* 139* 141* 101*  BUN 21*  --  20 27* 34* 27*  CREATININE 0.82 0.81 0.98 1.21* 1.57* 1.32*  CALCIUM 9.2  --  8.8* 8.9 8.8* 8.6*   Liver Function Tests: No results for input(s): AST, ALT, ALKPHOS, BILITOT, PROT, ALBUMIN in the last 168 hours. No results for input(s): LIPASE, AMYLASE in the last 168 hours. No results for input(s): AMMONIA in the last 168 hours. CBC:  Recent Labs Lab 04/27/16 0924 04/27/16 1427 04/28/16 0153 04/29/16 0512 04/30/16 0429 05/01/16 0453  WBC 10.4 9.2 9.2 10.9* 14.6* 11.6*  NEUTROABS 7.0  --   --   --   --   --   HGB 14.3 14.8 15.0 15.2* 14.2 13.6  HCT 43.2 43.7 42.7 43.1 41.4 39.5  MCV 87.8 87.4 83.9 84.7 85.4 84.8  PLT 190 225 189 218 204 202   Cardiac Enzymes:  Recent Labs Lab 04/27/16 1427 04/27/16 2027 04/28/16 0153  TROPONINI <0.03 <0.03 <0.03   BNP: BNP (last 3 results)  Recent Labs  06/12/15 1648 04/23/16 1405 04/27/16 1427  BNP 55.2 34.7 117.1*    ProBNP (last  3 results)  Recent Labs  08/09/15 1047  PROBNP 72.0    CBG:  Recent Labs Lab 04/25/16 1158 04/25/16 1703 04/25/16 2156 04/26/16 0735 04/26/16 1111  GLUCAP 173* 119* 139* 115* 255*       Signed:  Jeralyn Bennett MD.  [facility] 05/01/2016, 4:27 PM

## 2016-05-01 NOTE — Discharge Summary (Signed)
Physician Discharge Summary  [name] ZOX:[id number] DOB: Mar 24, 1923 DOA: 04/23/2016  PCP: Michele McalpineNADEL,SCOTT M, MD  Admit date: 04/23/2016 Discharge date: 05/01/2016  Time spent: 35 minutes  Recommendations for Outpatient Follow-up:  1. Suspect patient had aspiration event at her facility, esophagram performed during this hospitalization showing evidence of moderate to severe esophageal dysmotility. I spoke with GI over telephone who recommended a speech path consult at her facility.  2. Home Health orders placed for Speech and PT 3. Augmentin 875 one tablet by mouth twice a day 3 days then stop   Discharge Diagnoses:  Principal Problem:   Acute respiratory failure with hypoxia (HCC) Active Problems:   ANEMIA   Essential hypertension   Constipation   Spinal stenosis of lumbar region   LOW BACK PAIN SYNDROME   DYSPNEA   DIABETES MELLITUS, BORDERLINE   Anxiety   GERD (gastroesophageal reflux disease)   Chronic venous insufficiency   COPD (chronic obstructive pulmonary disease) (HCC)   Thyrotoxicosis   Allergic rhinitis   HCAP (healthcare-associated pneumonia): Probable   COPD exacerbation (HCC)   Dehydration   CKD (chronic kidney disease), stage III   Pressure ulcer   Hypoxia   Stage 1 decubitus ulcer   Discharge Condition: Stable  Diet recommendation: Mechanical soft  Filed Weights   04/23/16 1843 04/25/16 0500 04/26/16 0500  Weight: 80.287 kg (177 lb) 81.557 kg (179 lb 12.8 oz) 83.099 kg (183 lb 3.2 oz)    History of present illness:  82 year old Caucasian female with history of respiratory failure. Patient was discharged from this hospital last night after treatment for respiratory failure. On discharge, patient continued to report shortness of breath, and was said to be restless all night. Repeat CXR done today reveals right lower lobe infiltrate. WBC is normal. O2 sat is in the 90's. Not clear if there is associated fever or chills. Patient has cogh that is  intermittently productive of whitish phlegm, with associated running nose. No headache, no neck pain, no chest pain, no GI symptoms. Patient is said to have urinary frequency.  Hospital Course:  Mrs [name] is a 82 year old nursing home resident, who was recently discharged on 04/26/2016 at which time she was treated for a COPD exacerbation. A chest x-ray obtained on 04/23/2016 was interpreted as not having active disease by radiology. She even had a CT scan of lungs without contrast that revealed stable chronic bronchitic changes, no new infiltrates noted. During that hospitalization she was treated with systemic steroids, nebulizer treatments and antibiotic therapy. She showed clinical improvement as supplemental oxygen was titrated off and she was able to ambulate down the hallway with a walker. She was discharged back to her skilled nursing facility in stable condition. Overnight she developed worsening shortness of breath and came back to the emergency department. Chest x-ray on admission showed infiltrate at right base  1. Acute respiratory failure with hypoxia -Evidenced by patient's presentation in respiratory distress and having oxygen requirement of 3 L nasal cannula to maintain saturations -I am highly suspicious that she may have had a aspiration event at her skilled nursing facility -Continue antibiotics, supplemental oxygen -Modified barium study performed on 04/28/2016 showing evidence of mild oropharyngeal dysphagia  -SLP recommending esophagram that revealed moderate to severe esophageal dysmotility  2. Aspiration pneumonia versus healthcare associated pneumonia -On a recent hospitalization she was treated for COPD exacerbation. During that hospitalization she had a chest x-ray along with a CT scan of lungs.did not reveal acute infiltrate. -During that hospitalization she  got better as oxygen was titrated off. She was discharged back to her skilled nursing facility -She presented the  following day with worsening shortness of breath as a chest x-ray performed in the emergency room interpreted by radiology to have a right lower lobe infiltrate. -Modified barium study performed on 04/28/2016 showing evidence of mild oropharyngeal dysphagia, speech recommending dysphagia 3 diet.  -Speech pathology recommended further workup with esophagram that revealed moderate to severe esophageal dysmotility -Repeat CXR showing RLL infiltrate which seems stable. Clinically she was doing much better, ambulating down the hallway with a walker.  -She was discharged on Augmentin 875 one tablet by mouth twice a day for 3 more days then stop  3. Hypertension. -Continue amlodipine 5 mg by mouth daily  4. Hyperthyroidism -She is on methimazole 5 mg every other day -Last TSH was checked on 04/12/2016, within normal limits at 3.87.   5. Stage III chronic kidney disease -On 05/01/2016 had a creatinine of 1.32 with BUN of 27, please follow renal function  6. History of borderline diabetes -Hemoglobin A1c 6.3   7. COPD exacerbation -She was recently treated for COPD exacerbation  -Will stop Prednisone today, lung exam significantly improved  Procedures:  Esophagram performed on 04/30/2016 IMPRESSION: Moderate-to-marked esophageal dysmotility.  No stricture or mass visualized.  Consultations:  Speech pathology  Discharge Exam: Filed Vitals:   04/25/16 2157 04/26/16 0500  BP: 139/52 156/70  Pulse: 72 65  Temp: 98 F (36.7 C) 98.2 F (36.8 C)  Resp: 20 20    General exam: She appears stable, ambulated down the hallway Respiratory system: There is some improvement to lung exam, less rhonchi Cardiovascular system: S1 & S2 heard, RRR. No JVD, murmurs, rubs, gallops or clicks. No pedal edema. Gastrointestinal system: Abdomen is nondistended, soft and nontender. No organomegaly or masses felt. Normal bowel sounds heard. Central nervous system: Alert and oriented. No focal  neurological deficits. Extremities: Symmetric 5 x 5 power. Skin: No rashes, lesions or ulcers Psychiatry: Pleasantly confused, disoriented.   Discharge Instructions   Discharge Instructions    Call MD for:  difficulty breathing, headache or visual disturbances    Complete by:  As directed      Call MD for:  difficulty breathing, headache or visual disturbances    Complete by:  As directed      Call MD for:  extreme fatigue    Complete by:  As directed      Call MD for:  extreme fatigue    Complete by:  As directed      Call MD for:  hives    Complete by:  As directed      Call MD for:  hives    Complete by:  As directed      Call MD for:  persistant dizziness or light-headedness    Complete by:  As directed      Call MD for:  persistant dizziness or light-headedness    Complete by:  As directed      Call MD for:  persistant nausea and vomiting    Complete by:  As directed      Call MD for:  persistant nausea and vomiting    Complete by:  As directed      Call MD for:  redness, tenderness, or signs of infection (pain, swelling, redness, odor or green/yellow discharge around incision site)    Complete by:  As directed      Call MD for:  redness, tenderness, or signs  of infection (pain, swelling, redness, odor or green/yellow discharge around incision site)    Complete by:  As directed      Call MD for:  severe uncontrolled pain    Complete by:  As directed      Call MD for:  severe uncontrolled pain    Complete by:  As directed      Call MD for:  temperature >100.4    Complete by:  As directed      Call MD for:  temperature >100.4    Complete by:  As directed      Call MD for:    Complete by:  As directed      Call MD for:    Complete by:  As directed      Diet - low sodium heart healthy    Complete by:  As directed      Diet - low sodium heart healthy    Complete by:  As directed      Increase activity slowly    Complete by:  As directed      Increase activity slowly     Complete by:  As directed           Discharge Medication List as of 04/26/2016  6:25 PM    CONTINUE these medications which have CHANGED   Details  ALPRAZolam (XANAX) 0.25 MG tablet Take 1 tablet (0.25 mg total) by mouth at bedtime as needed for sleep., Starting 04/26/2016, Until Discontinued, Print    Fluticasone-Salmeterol (ADVAIR DISKUS) 100-50 MCG/DOSE AEPB Inhale 1 puff into the lungs 2 (two) times daily., Starting 04/26/2016, Until Discontinued, Print    ipratropium-albuterol (DUONEB) 0.5-2.5 (3) MG/3ML SOLN Take 3 mLs by nebulization every 4 (four) hours as needed., Starting 04/26/2016, Until Discontinued, Print    doxycycline (VIBRA-TABS) 100 MG tablet Take 1 tablet (100 mg total) by mouth 2 (two) times daily., Starting 04/26/2016, Until Discontinued, Print    predniSONE (STERAPRED UNI-PAK 21 TAB) 10 MG (21) TBPK tablet Take 6-5-4-3-2-1 tablets by mouth daily till gone., Print      CONTINUE these medications which have NOT CHANGED   Details  acetaZOLAMIDE (DIAMOX) 250 MG tablet TAKE (1) TABLET BY MOUTH EACH MORNING., Normal    amLODipine (NORVASC) 5 MG tablet Take 1 tablet (5 mg total) by mouth daily., Starting 03/23/2016, Until Discontinued, Phone In    aspirin EC 81 MG tablet Take 81 mg by mouth daily., Until Discontinued, Historical Med    cetirizine (ZYRTEC) 10 MG tablet TAKE 1 TABLET BY MOUTH ONCE DAILY FOR ALLERGIES., Normal    Cholecalciferol (VITAMIN D3) 2000 UNITS capsule Take 1 capsule (2,000 Units total) by mouth daily., Starting 08/16/2015, Until Discontinued, Print    fluticasone (FLONASE) 50 MCG/ACT nasal spray SPRAY 2 SPRAYS INTO EACH NOSTRIL ONCE DAILY., Normal    guaifenesin (ROBITUSSIN) 100 MG/5ML syrup Take 200 mg by mouth 3 (three) times daily as needed for cough., Until Discontinued, Historical Med    methimazole (TAPAZOLE) 5 MG tablet Take 1 tablet (5 mg total) by mouth every other day., Starting 03/01/2016, Until Discontinued, Normal    Multiple Vitamin  (DAILY-VITE) TABS TAKE ONE TABLET BY MOUTH ONCE DAILY., Normal    ondansetron (ZOFRAN) 4 MG tablet Take 1 tablet (4 mg total) by mouth every 6 (six) hours as needed for nausea or vomiting., Starting 04/20/2016, Until Discontinued, Normal    polyethylene glycol (MIRALAX / GLYCOLAX) packet Take 17 g by mouth daily as needed for mild constipation. , Until  Discontinued, Historical Med    ranitidine (ZANTAC) 150 MG tablet TAKE ONE TABLET BY MOUTH ONCE DAILY., Normal    sertraline (ZOLOFT) 50 MG tablet TAKE (1) TABLET BY MOUTH AT BEDTIME., Phone In    Spacer/Aero-Holding Chambers (AEROCHAMBER PLUS WITH MASK) inhaler Use as instructed, Print    torsemide (DEMADEX) 20 MG tablet TAKE 1/2 TABLET BY MOUTH EVERY MORNING., Normal    PROAIR HFA 108 (90 BASE) MCG/ACT inhaler INHALE 2 PUFFS EVERY 4 HOURS AS NEEDED, Normal      STOP taking these medications     potassium chloride (K-DUR) 10 MEQ tablet      Q-NOL 325 MG tablet      DELSYM 30 MG/5ML liquid        Allergies  Allergen Reactions  . Azithromycin Shortness Of Breath  . Ciprofloxacin Other (See Comments)     hallucinations  . Levofloxacin Other (See Comments)    Insomnia, indigestion, tingling sensation in legs  . Furosemide Rash    Bullous pemphigoid  . Latex Rash  . Other Rash    EKG leads caused a rash that required steroids to clear   Follow-up Information    Follow up with NADEL,SCOTT M, MD In 1 week.   Specialty:  Pulmonary Disease   Contact information:   [address] [place] [address] [phone]        The results of significant diagnostics from this hospitalization (including imaging, microbiology, ancillary and laboratory) are listed below for reference.    Significant Diagnostic Studies: Dg Chest 2 View  04/30/2016  CLINICAL DATA:  82 year old female with shortness of Breath. Right lung base opacity recently. Initial encounter. EXAM: CHEST  2 VIEW COMPARISON:  Chest radiographs 04/27/2016 and earlier.  FINDINGS: Semi upright AP and lateral views of the chest. Chronic elevation of the right hemidiaphragm. Adjacent right lung base opacity appears unchanged since 04/27/2016. Curvilinear left lung base opacity is increased since that time. No definite associated pleural effusion. Stable cardiac size and mediastinal contours. No pneumothorax or pulmonary edema. Stable visualized osseous structures. IMPRESSION: Right greater than left lung base opacity suspicious for lung base pneumonia in this setting but might alternatively be atelectasis. No definite pleural effusion or new cardiopulmonary abnormality. 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 Electronically Signed   By: Odessa FlemingH  Hall M.D.   On: 04/30/2016 12:11   Dg Chest 2 View  04/27/2016  CLINICAL DATA:  Cough and shortness of breath for 1 day EXAM: CHEST  2 VIEW COMPARISON:  Chest radiograph and chest CT April 23, 2016 FINDINGS: There is persistent elevation of the right hemidiaphragm with patchy infiltrate in the right base. Lungs elsewhere are clear. Heart is upper normal in size with pulmonary vascularity within normal limits. No adenopathy. There is degenerative change in the thoracic spine. IMPRESSION: Elevation of the right hemidiaphragm with infiltrate right base. Lungs elsewhere clear. Stable cardiac silhouette. Electronically Signed   By: Bretta BangWilliam  Woodruff III M.D.   On: 04/27/2016 09:36   Dg Chest 2 View  04/23/2016  CLINICAL DATA:  Increasing shortness of breath for 3 days. EXAM: CHEST  2 VIEW COMPARISON:  04/20/2016 FINDINGS: Stable elevation of the right hemidiaphragm. No confluent opacities or effusions. Heart is normal size. No acute bony abnormality. IMPRESSION: Elevated right hemidiaphragm, stable.  No active disease. Electronically Signed   By: Charlett NoseKevin  Dover M.D.   On: 04/23/2016 13:52   Dg Chest 2 View  04/20/2016  CLINICAL  DATA:  Wheezing for 2 days. EXAM: CHEST  2 VIEW COMPARISON:   January 11, 2016 FINDINGS: There is stable chronic elevation of the right hemidiaphragm. The interstitium is prominent in a generalized manner, stable. There is no edema or consolidation. The heart size and pulmonary vascularity are within normal limits. There is atherosclerotic calcification in the aortic arch region. No bone lesions are evident. IMPRESSION: Stable elevation of the right hemidiaphragm. Generalized interstitial prominence remains, likely due to chronic underlying fibrotic type change. No frank edema or consolidation evident. No change in cardiac silhouette. Electronically Signed   By: Bretta Bang III M.D.   On: 04/20/2016 16:15   Ct Head Wo Contrast  04/23/2016  CLINICAL DATA:  Shortness of breath.  Confusion. EXAM: CT HEAD WITHOUT CONTRAST TECHNIQUE: Contiguous axial images were obtained from the base of the skull through the vertex without intravenous contrast. COMPARISON:  None. FINDINGS: There is mucosal thickening in the maxillary and ethmoid sinuses with a small amount of fluid in the left maxillary sinus. Mastoid air cells and middle ears are well aerated. Bones and soft tissues are otherwise normal. Extracranial soft tissues are normal. No subdural, epidural, or subarachnoid hemorrhage. Cerebellum, brainstem, and basal cisterns are normal. No mass, mass effect, or midline shift. Ventricles and sulci are unremarkable. No acute cortical ischemia or infarct. IMPRESSION: Sinus disease as described above.  No other abnormalities. Electronically Signed   By: Gerome Sam III M.D   On: 04/23/2016 18:19   Ct Chest Wo Contrast  04/23/2016  CLINICAL DATA:  Increasing shortness of breath for several days. EXAM: CT CHEST WITHOUT CONTRAST TECHNIQUE: Multidetector CT imaging of the chest was performed following the standard protocol without IV contrast. COMPARISON:  Chest radiographs obtained earlier today. Chest CTA dated 06/01/2009. FINDINGS: Mediastinum/Lymph Nodes: Atheromatous  calcifications, including the the proximal coronary arteries. No enlarged lymph nodes. Small thyroid nodules without significant change. The largest is a densely calcified nodule measuring 10 mm. Lungs/Pleura: Mild right lower lobe atelectasis and calcified granuloma. Mild left lower lobe atelectasis. Mild biapical pleural and parenchymal scarring. Peripheral inferior left upper lobe nodular and linear scarring, without significant change, measuring 7 mm on image number 63 of series 8. Diffuse peribronchial thickening without significant change. Upper abdomen: No acute findings. Musculoskeletal: Thoracic and lower cervical spine degenerative changes. Interval approximately 40% T11 vertebral body superior endplate compression deformity without bony retropulsion or acute fracture lines. Anterior spur formation at the T10-11 level. IMPRESSION: 1. Mild bilateral lower lobe atelectasis. 2. Stable chronic bronchitic changes. 3. Stable mild multinodular thyroid. The long-term stability is compatible with a benign process. Electronically Signed   By: Beckie Salts M.D.   On: 04/23/2016 18:28   Dg Esophagus  04/30/2016  CLINICAL DATA:  Difficulty swallowing. EXAM: ESOPHOGRAM/BARIUM SWALLOW TECHNIQUE: Single contrast examination was performed using  thin barium. FLUOROSCOPY TIME:  Radiation Exposure Index (as provided by the fluoroscopic device): 24.57 mGy COMPARISON:  None. FINDINGS: Limited exam was performed with the patient in the LPO and semi-upright or indentation. The esophagus is patent. There is no stricture or mass identified. Numerous non propulsive tertiary waves were demonstrated throughout examination resulting a to and fro peristalsis of the esophageal contents. No frank aspiration identified. IMPRESSION: Moderate-to-marked esophageal dysmotility. No stricture or mass visualized. Electronically Signed   By: Signa Kell M.D.   On: 04/30/2016 13:07   Dg Abd 2 Views  04/20/2016  CLINICAL DATA:  Fatigue and  decreased appetite, history of hyperlipidemia, COPD, Constipation, EXAM: ABDOMEN -  2 VIEW COMPARISON:  Abdominal and pelvic CT scan of November 26, 2007 and abdominal ultrasound dated Apr 15, 2014 FINDINGS: The small and large bowel gas pattern is within the limits of normal. There is no free extraluminal gas observed. There is elevation of the right hemidiaphragm which is chronic. There is curvature of the thoracolumbar spine. The pedicles are intact. The patient has undergone previous right hip prosthesis placement. IMPRESSION: No acute intra-abdominal abnormality is observed on this three-view series. The lower pelvic structures are not included in the field of view. Electronically Signed   By: David  [place] M.D.   On: 04/20/2016 16:16   Dg Swallowing Func-speech Pathology  04/28/2016  Objective Swallowing Evaluation: Type of Study: MBS-Modified Barium Swallow Study Patient Details Name: REILEY BERTAGNOLLI MRN: [id number] Date of Birth: 1923/09/04 Today's Date: 04/28/2016 Time: SLP Start Time (ACUTE ONLY): 1315-SLP Stop Time (ACUTE ONLY): 1340 SLP Time Calculation (min) (ACUTE ONLY): 25 min Past Medical History: Past Medical History Diagnosis Date . Shortness of breath  . Unspecified essential hypertension  . Right bundle branch block  . Other and unspecified hyperlipidemia  . Other abnormal glucose  . Diverticulosis of colon (without mention of hemorrhage)  . Pyelonephritis, unspecified  . Solitary cyst of breast  . Osteoarthrosis, unspecified whether generalized or localized, unspecified site  . Lumbago  . Spinal stenosis, unspecified region other than cervical  . Anemia, unspecified  . GERD (gastroesophageal reflux disease)  . Pyelonephritis  . DJD (degenerative joint disease)  . UTI (lower urinary tract infection)  Past Surgical History: Past Surgical History Procedure Laterality Date . Cataract extraction   . Right hip hemiarthroplasty     total . Conversion to right thr   HPI: Patient is a 82 y.o. female who  was readmitted after discharge on 6/9 after treatment for respiratory failure. Repeat CXR on 6/10 revealed right lower lobe infiltrate. PMH: GERD, SOB, spinal stenosis, COPD. Subjective: pleasant "Can I go home?" Assessment / Plan / Recommendation CHL IP CLINICAL IMPRESSIONS 04/28/2016 Therapy Diagnosis Mild oral phase dysphagia;Suspected primary esophageal dysphagia;Mild pharyngeal phase dysphagia Clinical Impression Patient presents with a mild oropharyngeal dysphagia characterized by mastication, manipulation and anterior to posterior movement delays with hard solids, with swallow initiation delay to vallecular sinus with hard solids. Thin liquids via large and small cup sips and via large and successive staw sips did not result in any penetration or aspiration. SLP observed (Radiologist not present to confirm) a mild retrograde movement of thin liquids to level of pyriform sinus and what appeared to be a tight UES, or some restriction in UES opening. Patient was able to clear all residuals naturally, and cleared solid texture residuals with sip of thin liquids.  Impact on safety and function Mild aspiration risk   CHL IP TREATMENT RECOMMENDATION 04/28/2016 Treatment Recommendations Therapy as outlined in treatment plan below   Prognosis 04/28/2016 Prognosis for Safe Diet Advancement Good Barriers to Reach Goals -- Barriers/Prognosis Comment -- CHL IP DIET RECOMMENDATION 04/28/2016 SLP Diet Recommendations Dysphagia 3 (Mech soft) solids;Thin liquid Liquid Administration via Cup;Straw Medication Administration Whole meds with puree Compensations Slow rate;Small sips/bites Postural Changes Remain semi-upright after after feeds/meals (Comment);Seated upright at 90 degrees   CHL IP OTHER RECOMMENDATIONS 04/28/2016 Recommended Consults Consider GI evaluation Oral Care Recommendations -- Other Recommendations --   CHL IP FOLLOW UP RECOMMENDATIONS 04/28/2016 Follow up Recommendations None   CHL IP FREQUENCY AND DURATION  04/28/2016 Speech Therapy Frequency (ACUTE ONLY) min 1 x/week Treatment Duration --  CHL IP ORAL PHASE 04/28/2016 Oral Phase Impaired Oral - Pudding Teaspoon -- Oral - Pudding Cup -- Oral - Honey Teaspoon -- Oral - Honey Cup -- Oral - Nectar Teaspoon -- Oral - Nectar Cup -- Oral - Nectar Straw -- Oral - Thin Teaspoon -- Oral - Thin Cup WFL Oral - Thin Straw WFL Oral - Puree Weak lingual manipulation;Reduced posterior propulsion;Delayed oral transit Oral - Mech Soft -- Oral - Regular Weak lingual manipulation;Impaired mastication;Piecemeal swallowing;Delayed oral transit Oral - Multi-Consistency -- Oral - Pill WFL Oral Phase - Comment --  CHL IP PHARYNGEAL PHASE 04/28/2016 Pharyngeal Phase Impaired Pharyngeal- Pudding Teaspoon -- Pharyngeal -- Pharyngeal- Pudding Cup -- Pharyngeal -- Pharyngeal- Honey Teaspoon -- Pharyngeal -- Pharyngeal- Honey Cup -- Pharyngeal -- Pharyngeal- Nectar Teaspoon -- Pharyngeal -- Pharyngeal- Nectar Cup -- Pharyngeal -- Pharyngeal- Nectar Straw -- Pharyngeal -- Pharyngeal- Thin Teaspoon -- Pharyngeal -- Pharyngeal- Thin Cup WFL Pharyngeal -- Pharyngeal- Thin Straw WFL Pharyngeal -- Pharyngeal- Puree WFL Pharyngeal -- Pharyngeal- Mechanical Soft -- Pharyngeal -- Pharyngeal- Regular Delayed swallow initiation-vallecula;Pharyngeal residue - valleculae;Compensatory strategies attempted (with notebox) Pharyngeal -- Pharyngeal- Multi-consistency -- Pharyngeal -- Pharyngeal- Pill WFL Pharyngeal -- Pharyngeal Comment --  CHL IP CERVICAL ESOPHAGEAL PHASE 04/28/2016 Cervical Esophageal Phase WFL Pudding Teaspoon -- Pudding Cup -- Honey Teaspoon -- Honey Cup -- Nectar Teaspoon -- Nectar Cup -- Nectar Straw -- Thin Teaspoon -- Thin Cup -- Thin Straw -- Puree -- Mechanical Soft -- Regular -- Multi-consistency -- Pill -- Cervical Esophageal Comment -- CHL IP GO 08/13/2014 Functional Assessment Tool Used clinical judgement Functional Limitations Swallowing Swallow Current Status (Z6109) CJ Swallow Goal  Status (U0454) CJ Swallow Discharge Status (U9811) CJ Motor Speech Current Status (B1478) (None) Motor Speech Goal Status (G9562) (None) Motor Speech Goal Status (Z3086) (None) Spoken Language Comprehension Current Status (V7846) (None) Spoken Language Comprehension Goal Status (N6295) (None) Spoken Language Comprehension Discharge Status (M8413) (None) Spoken Language Expression Current Status (K4401) (None) Spoken Language Expression Goal Status (U2725) (None) Spoken Language Expression Discharge Status [phone]) (None) Attention Current Status (I3474) (None) Attention Goal Status (Q5956) (None) Attention Discharge Status (L8756) (None) Memory Current Status (E3329) (None) Memory Goal Status (J1884) (None) Memory Discharge Status (Z6606) (None) Voice Current Status (T0160) (None) Voice Goal Status (F0932) (None) Voice Discharge Status (T5573) (None) Other Speech-Language Pathology Functional Limitation [phone]) (None) Other Speech-Language Pathology Functional Limitation Goal Status (K2706) (None) Other Speech-Language Pathology Functional Limitation Discharge Status [phone]) (None) Elio Forget Tarrell 04/28/2016, 3:05 PM    Angela Nevin, MA, CCC-SLP 04/28/2016 3:06 PM            Microbiology: Recent Results (from the past 240 hour(s))  Culture, Urine     Status: Abnormal   Collection Time: 04/23/16  4:03 PM  Result Value Ref Range Status   Specimen Description URINE, CLEAN CATCH  Final   Special Requests NONE  Final   Culture >=100,000 COLONIES/mL KLEBSIELLA PNEUMONIAE (A)  Final   Report Status 04/26/2016 FINAL  Final   Organism ID, Bacteria KLEBSIELLA PNEUMONIAE (A)  Final      Susceptibility   Klebsiella pneumoniae - MIC*    AMPICILLIN 16 RESISTANT Resistant     CEFAZOLIN <=4 SENSITIVE Sensitive     CEFTRIAXONE <=1 SENSITIVE Sensitive     CIPROFLOXACIN <=0.25 SENSITIVE Sensitive     GENTAMICIN <=1 SENSITIVE Sensitive     IMIPENEM <=0.25 SENSITIVE Sensitive     NITROFURANTOIN <=16 SENSITIVE  Sensitive     TRIMETH/SULFA <=20 SENSITIVE Sensitive  AMPICILLIN/SULBACTAM 4 SENSITIVE Sensitive     PIP/TAZO <=4 SENSITIVE Sensitive     * >=100,000 COLONIES/mL KLEBSIELLA PNEUMONIAE  Culture, blood (routine x 2) Call MD if unable to obtain prior to antibiotics being given     Status: None   Collection Time: 04/23/16  5:50 PM  Result Value Ref Range Status   Specimen Description BLOOD RIGHT HAND  Final   Special Requests BOTTLES DRAWN AEROBIC ONLY 5CC  Final   Culture   Final    NO GROWTH 5 DAYS Performed at [facility]    Report Status 04/28/2016 FINAL  Final  Culture, blood (routine x 2) Call MD if unable to obtain prior to antibiotics being given     Status: None   Collection Time: 04/23/16  5:51 PM  Result Value Ref Range Status   Specimen Description BLOOD RIGHT WRIST  Final   Special Requests BOTTLES DRAWN AEROBIC AND ANAEROBIC 5CC  Final   Culture   Final    NO GROWTH 5 DAYS Performed at [facility]    Report Status 04/28/2016 FINAL  Final  MRSA PCR Screening     Status: None   Collection Time: 04/23/16  6:42 PM  Result Value Ref Range Status   MRSA by PCR NEGATIVE NEGATIVE Final    Comment:        The GeneXpert MRSA Assay (FDA approved for NASAL specimens only), is one component of a comprehensive MRSA colonization surveillance program. It is not intended to diagnose MRSA infection nor to guide or monitor treatment for MRSA infections.   Urine culture     Status: Abnormal   Collection Time: 04/27/16 11:27 AM  Result Value Ref Range Status   Specimen Description URINE, RANDOM  Final   Special Requests NONE  Final   Culture (A)  Final    <10,000 COLONIES/mL INSIGNIFICANT GROWTH Performed at [facility]    Report Status 04/29/2016 FINAL  Final     Labs: Basic Metabolic Panel:  Recent Labs Lab 04/27/16 0924 04/27/16 1427 04/28/16 0153 04/29/16 0512 04/30/16 0429 05/01/16 0453  NA 134*  --  131* 131* 131* 133*  K 3.8  --   3.8 4.4 3.9 3.6  CL 101  --  96* 97* 93* 95*  CO2 26  --  27 26 29  34*  GLUCOSE 101*  --  196* 139* 141* 101*  BUN 21*  --  20 27* 34* 27*  CREATININE 0.82 0.81 0.98 1.21* 1.57* 1.32*  CALCIUM 9.2  --  8.8* 8.9 8.8* 8.6*   Liver Function Tests: No results for input(s): AST, ALT, ALKPHOS, BILITOT, PROT, ALBUMIN in the last 168 hours. No results for input(s): LIPASE, AMYLASE in the last 168 hours. No results for input(s): AMMONIA in the last 168 hours. CBC:  Recent Labs Lab 04/27/16 0924 04/27/16 1427 04/28/16 0153 04/29/16 0512 04/30/16 0429 05/01/16 0453  WBC 10.4 9.2 9.2 10.9* 14.6* 11.6*  NEUTROABS 7.0  --   --   --   --   --   HGB 14.3 14.8 15.0 15.2* 14.2 13.6  HCT 43.2 43.7 42.7 43.1 41.4 39.5  MCV 87.8 87.4 83.9 84.7 85.4 84.8  PLT 190 225 189 218 204 202   Cardiac Enzymes:  Recent Labs Lab 04/27/16 1427 04/27/16 2027 04/28/16 0153  TROPONINI <0.03 <0.03 <0.03   BNP: BNP (last 3 results)  Recent Labs  06/12/15 1648 04/23/16 1405 04/27/16 1427  BNP 55.2 34.7 117.1*    ProBNP (last  3 results)  Recent Labs  08/09/15 1047  PROBNP 72.0    CBG:  Recent Labs Lab 04/25/16 1158 04/25/16 1703 04/25/16 2156 04/26/16 0735 04/26/16 1111  GLUCAP 173* 119* 139* 115* 255*       Signed:  Jeralyn Bennett MD.  [facility] 05/01/2016, 4:29 PM

## 2016-05-01 NOTE — Discharge Summary (Deleted)
Expand All Collapse All   Physician Discharge Summary  CAMAURI FLEECE ZOX:[id number] DOB: [birth year] DOA: 04/27/2016  PCP: Michele Mcalpine, MD  Admit date: 04/27/2016 Discharge date: 05/01/2016  Time spent: 35 minutes  Recommendations for Outpatient Follow-up:  1. Suspect patient had aspiration event at her facility, esophagram performed during this hospitalization showing evidence of moderate to severe esophageal dysmotility. I spoke with GI over telephone who recommended a speech path consult at her facility.  2. Home Health orders placed for Speech and PT 3. Augmentin 875 one tablet by mouth twice a day 3 days then stop   Discharge Diagnoses:  Principal Problem:  Respiratory failure (HCC) Active Problems:  Essential hypertension  COPD (chronic obstructive pulmonary disease) (HCC)  HCAP (healthcare-associated pneumonia): Probable  CKD (chronic kidney disease), stage III   Stage 1 decubitus ulcer   Esophageal dysmotility  Discharge Condition: Stable  Diet recommendation: Mechanical soft  Filed Weights   04/27/16 1602  Weight: 81.7 kg (180 lb 1.9 oz)    History of present illness:  82 year old Caucasian female with history of respiratory failure. Patient was discharged from this hospital last night after treatment for respiratory failure. On discharge, patient continued to report shortness of breath, and was said to be restless all night. Repeat CXR done today reveals right lower lobe infiltrate. WBC is normal. O2 sat is in the 90's. Not clear if there is associated fever or chills. Patient has cogh that is intermittently productive of whitish phlegm, with associated running nose. No headache, no neck pain, no chest pain, no GI symptoms. Patient is said to have urinary frequency.  Hospital Course:  Mrs Wygant is a 82 year old nursing home resident, who was recently discharged on 04/26/2016 at which time she was treated for a COPD exacerbation. A chest x-ray  obtained on 04/23/2016 was interpreted as not having active disease by radiology. She even had a CT scan of lungs without contrast that revealed stable chronic bronchitic changes, no new infiltrates noted. During that hospitalization she was treated with systemic steroids, nebulizer treatments and antibiotic therapy. She showed clinical improvement as supplemental oxygen was titrated off and she was able to ambulate down the hallway with a walker. She was discharged back to her skilled nursing facility in stable condition. Overnight she developed worsening shortness of breath and came back to the emergency department. Chest x-ray on admission showed infiltrate at right base  1. Acute respiratory failure with hypoxia -Evidenced by patient's presentation in respiratory distress and having oxygen requirement of 3 L nasal cannula to maintain saturations -I am highly suspicious that she may have had a aspiration event at her skilled nursing facility -Continue antibiotics, supplemental oxygen -Modified barium study performed on 04/28/2016 showing evidence of mild oropharyngeal dysphagia  -SLP recommending esophagram that revealed moderate to severe esophageal dysmotility  2. Aspiration pneumonia versus healthcare associated pneumonia -On a recent hospitalization she was treated for COPD exacerbation. During that hospitalization she had a chest x-ray along with a CT scan of lungs.did not reveal acute infiltrate. -During that hospitalization she got better as oxygen was titrated off. She was discharged back to her skilled nursing facility -She presented the following day with worsening shortness of breath as a chest x-ray performed in the emergency room interpreted by radiology to have a right lower lobe infiltrate. -Modified barium study performed on 04/28/2016 showing evidence of mild oropharyngeal dysphagia, speech recommending dysphagia 3 diet.  -Speech pathology recommended further workup with esophagram  that revealed moderate to severe  esophageal dysmotility -Repeat CXR showing RLL infiltrate which seems stable. Clinically she was doing much better, ambulating down the hallway with a walker.  -She was discharged on Augmentin 875 one tablet by mouth twice a day for 3 more days then stop  3. Hypertension. -Continue amlodipine 5 mg by mouth daily  4. Hyperthyroidism -She is on methimazole 5 mg every other day -Last TSH was checked on 04/12/2016, within normal limits at 3.87.   5. Stage III chronic kidney disease -On 05/01/2016 had a creatinine of 1.32 with BUN of 27, please follow renal function  6. History of borderline diabetes -Hemoglobin A1c 6.3   7. COPD exacerbation -She was recently treated for COPD exacerbation  -Will stop Prednisone today, lung exam significantly improved  Procedures:  Esophagram performed on 04/30/2016 IMPRESSION: Moderate-to-marked esophageal dysmotility.  No stricture or mass visualized.  Consultations:  Speech pathology  Discharge Exam: Filed Vitals:   04/30/16 2217 05/01/16 0604  BP: 124/84 119/76  Pulse: 73 68  Temp: 98.1 F (36.7 C) 98.4 F (36.9 C)  Resp: 16 16    General exam: She appears stable, ambulated down the hallway Respiratory system: There is some improvement to lung exam, less rhonchi Cardiovascular system: S1 & S2 heard, RRR. No JVD, murmurs, rubs, gallops or clicks. No pedal edema. Gastrointestinal system: Abdomen is nondistended, soft and nontender. No organomegaly or masses felt. Normal bowel sounds heard. Central nervous system: Alert and oriented. No focal neurological deficits. Extremities: Symmetric 5 x 5 power. Skin: No rashes, lesions or ulcers Psychiatry: Pleasantly confused, disoriented.   Discharge Instructions    Current Discharge Medication List    START taking these medications   Details  PROAIR HFA 108 (90 Base) MCG/ACT inhaler USE 1 TO 2 PUFFS EVERY SIX HOURS AS  NEEDED. Qty: 8.5 g, Refills: 0      CONTINUE these medications which have NOT CHANGED   Details  acetaminophen (TYLENOL) 325 MG tablet Take 650 mg by mouth every 4 (four) hours as needed for mild pain.    acetaZOLAMIDE (DIAMOX) 250 MG tablet TAKE (1) TABLET BY MOUTH EACH MORNING. Qty: 30 tablet, Refills: 5    ALPRAZolam (XANAX) 0.25 MG tablet Take 1 tablet (0.25 mg total) by mouth at bedtime as needed for sleep. Qty: 10 tablet, Refills: 0    amLODipine (NORVASC) 5 MG tablet Take 1 tablet (5 mg total) by mouth daily. Qty: 30 tablet, Refills: 6    aspirin EC 81 MG tablet Take 81 mg by mouth daily.    cetirizine (ZYRTEC) 10 MG tablet TAKE 1 TABLET BY MOUTH ONCE DAILY FOR ALLERGIES. Qty: 30 tablet, Refills: 5    Cholecalciferol (VITAMIN D3) 2000 UNITS capsule Take 1 capsule (2,000 Units total) by mouth daily. Qty: 30 capsule, Refills: 6    fluticasone (FLONASE) 50 MCG/ACT nasal spray SPRAY 2 SPRAYS INTO EACH NOSTRIL ONCE DAILY. Qty: 16 g, Refills: 5    Fluticasone-Salmeterol (ADVAIR DISKUS) 100-50 MCG/DOSE AEPB Inhale 1 puff into the lungs 2 (two) times daily. Qty: 60 each, Refills: 5    guaifenesin (ROBITUSSIN) 100 MG/5ML syrup Take 200 mg by mouth 3 (three) times daily as needed for cough.    ipratropium-albuterol (DUONEB) 0.5-2.5 (3) MG/3ML SOLN Take 3 mLs by nebulization every 4 (four) hours as needed. Qty: 360 mL, Refills: 0    methimazole (TAPAZOLE) 5 MG tablet Take 1 tablet (5 mg total) by mouth every other day. Qty: 30 tablet, Refills: 1    Multiple Vitamin (DAILY-VITE) TABS TAKE ONE  TABLET BY MOUTH ONCE DAILY. Qty: 30 tablet, Refills: 11    ondansetron (ZOFRAN) 4 MG tablet Take 1 tablet (4 mg total) by mouth every 6 (six) hours as needed for nausea or vomiting. Qty: 15 tablet, Refills: 0    polyethylene glycol (MIRALAX / GLYCOLAX) packet Take 17 g by mouth daily as needed for mild constipation.     ranitidine  (ZANTAC) 150 MG tablet TAKE ONE TABLET BY MOUTH ONCE DAILY. Qty: 30 tablet, Refills: 0    sertraline (ZOLOFT) 50 MG tablet TAKE (1) TABLET BY MOUTH AT BEDTIME. Qty: 30 tablet, Refills: 5    torsemide (DEMADEX) 20 MG tablet TAKE 1/2 TABLET BY MOUTH EVERY MORNING. Qty: 15 tablet, Refills: 11    Spacer/Aero-Holding Chambers (AEROCHAMBER PLUS WITH MASK) inhaler Use as instructed Qty: 1 each, Refills: 2       Allergies  Allergen Reactions  . Azithromycin Shortness Of Breath  . Ciprofloxacin Other (See Comments)    hallucinations  . Levofloxacin Other (See Comments)    Insomnia, indigestion, tingling sensation in legs  . Furosemide Rash    Bullous pemphigoid  . Latex Rash  . Other Rash    EKG leads caused a rash that required steroids to clear       The results of significant diagnostics from this hospitalization (including imaging, microbiology, ancillary and laboratory) are listed below for reference.    Significant Diagnostic Studies:  Imaging Results    Dg Chest 2 View  04/30/2016 CLINICAL DATA: 82 year old female with shortness of Breath. Right lung base opacity recently. Initial encounter. EXAM: CHEST 2 VIEW COMPARISON: Chest radiographs 04/27/2016 and earlier. FINDINGS: Semi upright AP and lateral views of the chest. Chronic elevation of the right hemidiaphragm. Adjacent right lung base opacity appears unchanged since 04/27/2016. Curvilinear left lung base opacity is increased since that time. No definite associated pleural effusion. Stable cardiac size and mediastinal contours. No pneumothorax or pulmonary edema. Stable visualized osseous structures. IMPRESSION: Right greater than left lung base opacity suspicious for lung base pneumonia in this setting but might alternatively be atelectasis. No definite pleural effusion or new cardiopulmonary abnormality. There is chronic elevation of the right hemidiaphragm suggesting a  chronic right phrenic nerve palsy. Electronically Signed By: Odessa Fleming M.D. On: 04/30/2016 12:11   Dg Chest 2 View  04/27/2016 CLINICAL DATA: Cough and shortness of breath for 1 day EXAM: CHEST 2 VIEW COMPARISON: Chest radiograph and chest CT April 23, 2016 FINDINGS: There is persistent elevation of the right hemidiaphragm with patchy infiltrate in the right base. Lungs elsewhere are clear. Heart is upper normal in size with pulmonary vascularity within normal limits. No adenopathy. There is degenerative change in the thoracic spine. IMPRESSION: Elevation of the right hemidiaphragm with infiltrate right base. Lungs elsewhere clear. Stable cardiac silhouette. Electronically Signed By: Bretta Bang III M.D. On: 04/27/2016 09:36   Dg Chest 2 View  04/23/2016 CLINICAL DATA: Increasing shortness of breath for 3 days. EXAM: CHEST 2 VIEW COMPARISON: 04/20/2016 FINDINGS: Stable elevation of the right hemidiaphragm. No confluent opacities or effusions. Heart is normal size. No acute bony abnormality. IMPRESSION: Elevated right hemidiaphragm, stable. No active disease. Electronically Signed By: Charlett Nose M.D. On: 04/23/2016 13:52   Dg Chest 2 View  04/20/2016 CLINICAL DATA: Wheezing for 2 days. EXAM: CHEST 2 VIEW COMPARISON: January 11, 2016 FINDINGS: There is stable chronic elevation of the right hemidiaphragm. The interstitium is prominent in a generalized manner, stable. There is no edema or consolidation. The heart size and pulmonary  vascularity are within normal limits. There is atherosclerotic calcification in the aortic arch region. No bone lesions are evident. IMPRESSION: Stable elevation of the right hemidiaphragm. Generalized interstitial prominence remains, likely due to chronic underlying fibrotic type change. No frank edema or consolidation evident. No change in cardiac silhouette. Electronically Signed By: Bretta Bang III M.D. On: 04/20/2016 16:15   Ct Head Wo  Contrast  04/23/2016 CLINICAL DATA: Shortness of breath. Confusion. EXAM: CT HEAD WITHOUT CONTRAST TECHNIQUE: Contiguous axial images were obtained from the base of the skull through the vertex without intravenous contrast. COMPARISON: None. FINDINGS: There is mucosal thickening in the maxillary and ethmoid sinuses with a small amount of fluid in the left maxillary sinus. Mastoid air cells and middle ears are well aerated. Bones and soft tissues are otherwise normal. Extracranial soft tissues are normal. No subdural, epidural, or subarachnoid hemorrhage. Cerebellum, brainstem, and basal cisterns are normal. No mass, mass effect, or midline shift. Ventricles and sulci are unremarkable. No acute cortical ischemia or infarct. IMPRESSION: Sinus disease as described above. No other abnormalities. Electronically Signed By: Gerome Sam III M.D On: 04/23/2016 18:19   Ct Chest Wo Contrast  04/23/2016 CLINICAL DATA: Increasing shortness of breath for several days. EXAM: CT CHEST WITHOUT CONTRAST TECHNIQUE: Multidetector CT imaging of the chest was performed following the standard protocol without IV contrast. COMPARISON: Chest radiographs obtained earlier today. Chest CTA dated 06/01/2009. FINDINGS: Mediastinum/Lymph Nodes: Atheromatous calcifications, including the the proximal coronary arteries. No enlarged lymph nodes. Small thyroid nodules without significant change. The largest is a densely calcified nodule measuring 10 mm. Lungs/Pleura: Mild right lower lobe atelectasis and calcified granuloma. Mild left lower lobe atelectasis. Mild biapical pleural and parenchymal scarring. Peripheral inferior left upper lobe nodular and linear scarring, without significant change, measuring 7 mm on image number 63 of series 8. Diffuse peribronchial thickening without significant change. Upper abdomen: No acute findings. Musculoskeletal: Thoracic and lower cervical spine degenerative changes. Interval approximately  40% T11 vertebral body superior endplate compression deformity without bony retropulsion or acute fracture lines. Anterior spur formation at the T10-11 level. IMPRESSION: 1. Mild bilateral lower lobe atelectasis. 2. Stable chronic bronchitic changes. 3. Stable mild multinodular thyroid. The long-term stability is compatible with a benign process. Electronically Signed By: Beckie Salts M.D. On: 04/23/2016 18:28   Dg Esophagus  04/30/2016 CLINICAL DATA: Difficulty swallowing. EXAM: ESOPHOGRAM/BARIUM SWALLOW TECHNIQUE: Single contrast examination was performed using thin barium. FLUOROSCOPY TIME: Radiation Exposure Index (as provided by the fluoroscopic device): 24.57 mGy COMPARISON: None. FINDINGS: Limited exam was performed with the patient in the LPO and semi-upright or indentation. The esophagus is patent. There is no stricture or mass identified. Numerous non propulsive tertiary waves were demonstrated throughout examination resulting a to and fro peristalsis of the esophageal contents. No frank aspiration identified. IMPRESSION: Moderate-to-marked esophageal dysmotility. No stricture or mass visualized. Electronically Signed By: Signa Kell M.D. On: 04/30/2016 13:07   Dg Abd 2 Views  04/20/2016 CLINICAL DATA: Fatigue and decreased appetite, history of hyperlipidemia, COPD, Constipation, EXAM: ABDOMEN - 2 VIEW COMPARISON: Abdominal and pelvic CT scan of November 26, 2007 and abdominal ultrasound dated Apr 15, 2014 FINDINGS: The small and large bowel gas pattern is within the limits of normal. There is no free extraluminal gas observed. There is elevation of the right hemidiaphragm which is chronic. There is curvature of the thoracolumbar spine. The pedicles are intact. The patient has undergone previous right hip prosthesis placement. IMPRESSION: No acute intra-abdominal abnormality is observed on this three-view series. The  lower pelvic structures are not included in the field of view.  Electronically Signed By: David Swaziland M.D. On: 04/20/2016 16:16   Dg Swallowing Func-speech Pathology  04/28/2016 Objective Swallowing Evaluation: Type of Study: MBS-Modified Barium Swallow Study Patient Details Name: [name] MRN: [id number] Date of Birth: 17-Nov-1923 Today's Date: 04/28/2016 Time: SLP Start Time (ACUTE ONLY): 1315-SLP Stop Time (ACUTE ONLY): 1340 SLP Time Calculation (min) (ACUTE ONLY): 25 min Past Medical History: Past Medical History Diagnosis Date . Shortness of breath . Unspecified essential hypertension . Right bundle branch block . Other and unspecified hyperlipidemia . Other abnormal glucose . Diverticulosis of colon (without mention of hemorrhage) . Pyelonephritis, unspecified . Solitary cyst of breast . Osteoarthrosis, unspecified whether generalized or localized, unspecified site . Lumbago . Spinal stenosis, unspecified region other than cervical . Anemia, unspecified . GERD (gastroesophageal reflux disease) . Pyelonephritis . DJD (degenerative joint disease) . UTI (lower urinary tract infection) Past Surgical History: Past Surgical History Procedure Laterality Date . Cataract extraction . Right hip hemiarthroplasty total . Conversion to right thr HPI: Patient is a 82 y.o. female who was readmitted after discharge on 6/9 after treatment for respiratory failure. Repeat CXR on 6/10 revealed right lower lobe infiltrate. PMH: GERD, SOB, spinal stenosis, COPD. Subjective: pleasant "Can I go home?" Assessment / Plan / Recommendation CHL IP CLINICAL IMPRESSIONS 04/28/2016 Therapy Diagnosis Mild oral phase dysphagia;Suspected primary esophageal dysphagia;Mild pharyngeal phase dysphagia Clinical Impression Patient presents with a mild oropharyngeal dysphagia characterized by mastication, manipulation and anterior to posterior movement delays with hard solids, with swallow initiation delay to vallecular sinus with hard solids. Thin liquids via large and small  cup sips and via large and successive staw sips did not result in any penetration or aspiration. SLP observed (Radiologist not present to confirm) a mild retrograde movement of thin liquids to level of pyriform sinus and what appeared to be a tight UES, or some restriction in UES opening. Patient was able to clear all residuals naturally, and cleared solid texture residuals with sip of thin liquids. Impact on safety and function Mild aspiration risk CHL IP TREATMENT RECOMMENDATION 04/28/2016 Treatment Recommendations Therapy as outlined in treatment plan below Prognosis 04/28/2016 Prognosis for Safe Diet Advancement Good Barriers to Reach Goals -- Barriers/Prognosis Comment -- CHL IP DIET RECOMMENDATION 04/28/2016 SLP Diet Recommendations Dysphagia 3 (Mech soft) solids;Thin liquid Liquid Administration via Cup;Straw Medication Administration Whole meds with puree Compensations Slow rate;Small sips/bites Postural Changes Remain semi-upright after after feeds/meals (Comment);Seated upright at 90 degrees CHL IP OTHER RECOMMENDATIONS 04/28/2016 Recommended Consults Consider GI evaluation Oral Care Recommendations -- Other Recommendations -- CHL IP FOLLOW UP RECOMMENDATIONS 04/28/2016 Follow up Recommendations None CHL IP FREQUENCY AND DURATION 04/28/2016 Speech Therapy Frequency (ACUTE ONLY) min 1 x/week Treatment Duration -- CHL IP ORAL PHASE 04/28/2016 Oral Phase Impaired Oral - Pudding Teaspoon -- Oral - Pudding Cup -- Oral - Honey Teaspoon -- Oral - Honey Cup -- Oral - Nectar Teaspoon -- Oral - Nectar Cup -- Oral - Nectar Straw -- Oral - Thin Teaspoon -- Oral - Thin Cup WFL Oral - Thin Straw WFL Oral - Puree Weak lingual manipulation;Reduced posterior propulsion;Delayed oral transit Oral - Mech Soft -- Oral - Regular Weak lingual manipulation;Impaired mastication;Piecemeal swallowing;Delayed oral transit Oral - Multi-Consistency -- Oral - Pill WFL Oral Phase - Comment -- CHL IP PHARYNGEAL PHASE 04/28/2016  Pharyngeal Phase Impaired Pharyngeal- Pudding Teaspoon -- Pharyngeal -- Pharyngeal- Pudding Cup -- Pharyngeal -- Pharyngeal- Honey Teaspoon -- Pharyngeal -- Pharyngeal- Honey Cup --  Pharyngeal -- Pharyngeal- Nectar Teaspoon -- Pharyngeal -- Pharyngeal- Nectar Cup -- Pharyngeal -- Pharyngeal- Nectar Straw -- Pharyngeal -- Pharyngeal- Thin Teaspoon -- Pharyngeal -- Pharyngeal- Thin Cup WFL Pharyngeal -- Pharyngeal- Thin Straw WFL Pharyngeal -- Pharyngeal- Puree WFL Pharyngeal -- Pharyngeal- Mechanical Soft -- Pharyngeal -- Pharyngeal- Regular Delayed swallow initiation-vallecula;Pharyngeal residue - valleculae;Compensatory strategies attempted (with notebox) Pharyngeal -- Pharyngeal- Multi-consistency -- Pharyngeal -- Pharyngeal- Pill WFL Pharyngeal -- Pharyngeal Comment -- CHL IP CERVICAL ESOPHAGEAL PHASE 04/28/2016 Cervical Esophageal Phase WFL Pudding Teaspoon -- Pudding Cup -- Honey Teaspoon -- Honey Cup -- Nectar Teaspoon -- Nectar Cup -- Nectar Straw -- Thin Teaspoon -- Thin Cup -- Thin Straw -- Puree -- Mechanical Soft -- Regular -- Multi-consistency -- Pill -- Cervical Esophageal Comment -- CHL IP GO 08/13/2014 Functional Assessment Tool Used clinical judgement Functional Limitations Swallowing Swallow Current Status (L2440(G8996) CJ Swallow Goal Status (N0272(G8997) CJ Swallow Discharge Status (Z3664(G8998) CJ Motor Speech Current Status (Q0347(G8999) (None) Motor Speech Goal Status (Q2595(G9186) (None) Motor Speech Goal Status (G3875(G9158) (None) Spoken Language Comprehension Current Status (I4332(G9159) (None) Spoken Language Comprehension Goal Status (R5188(G9160) (None) Spoken Language Comprehension Discharge Status [phone]([phone]) (None) Spoken Language Expression Current Status (Y3016(G9162) (None) Spoken Language Expression Goal Status (W1093(G9163) (None) Spoken Language Expression Discharge Status [phone](G9164) (None) Attention Current Status (D2202(G9165) (None) Attention Goal Status (R4270(G9166) (None) Attention Discharge Status (W2376(G9167) (None) Memory Current Status (E8315(G9168) (None)  Memory Goal Status (V7616(G9169) (None) Memory Discharge Status (W7371(G9170) (None) Voice Current Status (G6269(G9171) (None) Voice Goal Status (S8546(G9172) (None) Voice Discharge Status (E7035(G9173) (None) Other Speech-Language Pathology Functional Limitation [phone](G9174) (None) Other Speech-Language Pathology Functional Limitation Goal Status (H8299(G9175) (None) Other Speech-Language Pathology Functional Limitation Discharge Status [phone]([phone]) (None) Elio ForgetPreston, John Tarrell 04/28/2016, 3:05 PM Angela NevinJohn T. Preston, MA, CCC-SLP 04/28/2016 3:06 PM     Microbiology: Recent Results (from the past 240 hour(s))  Culture, Urine Status: Abnormal   Collection Time: 04/23/16 4:03 PM  Result Value Ref Range Status   Specimen Description URINE, CLEAN CATCH  Final   Special Requests NONE  Final   Culture >=100,000 COLONIES/mL KLEBSIELLA PNEUMONIAE (A)  Final   Report Status 04/26/2016 FINAL  Final   Organism ID, Bacteria KLEBSIELLA PNEUMONIAE (A)  Final   Susceptibility   Klebsiella pneumoniae - MIC*    AMPICILLIN 16 RESISTANT Resistant     CEFAZOLIN <=4 SENSITIVE Sensitive     CEFTRIAXONE <=1 SENSITIVE Sensitive     CIPROFLOXACIN <=0.25 SENSITIVE Sensitive     GENTAMICIN <=1 SENSITIVE Sensitive     IMIPENEM <=0.25 SENSITIVE Sensitive     NITROFURANTOIN <=16 SENSITIVE Sensitive     TRIMETH/SULFA <=20 SENSITIVE Sensitive     AMPICILLIN/SULBACTAM 4 SENSITIVE Sensitive     PIP/TAZO <=4 SENSITIVE Sensitive    * >=100,000 COLONIES/mL KLEBSIELLA PNEUMONIAE  Culture, blood (routine x 2) Call MD if unable to obtain prior to antibiotics being given Status: None   Collection Time: 04/23/16 5:50 PM  Result Value Ref Range Status   Specimen Description BLOOD RIGHT HAND  Final   Special Requests BOTTLES DRAWN AEROBIC ONLY 5CC  Final   Culture   Final    NO GROWTH 5 DAYS [facility]      Report Status 04/28/2016 FINAL  Final  Culture, blood (routine x 2) Call MD if unable to obtain prior to antibiotics being given Status: None   Collection Time: 04/23/16 5:51 PM  Result Value Ref Range Status   Specimen Description BLOOD RIGHT WRIST  Final   Special Requests BOTTLES  DRAWN AEROBIC AND ANAEROBIC 5CC  Final   Culture   Final    NO GROWTH 5 DAYS Performed at [facility]    Report Status 04/28/2016 FINAL  Final  MRSA PCR Screening Status: None   Collection Time: 04/23/16 6:42 PM  Result Value Ref Range Status   MRSA by PCR NEGATIVE NEGATIVE Final    Comment:   The GeneXpert MRSA Assay (FDA approved for NASAL specimens only), is one component of a comprehensive MRSA colonization surveillance program. It is not intended to diagnose MRSA infection nor to guide or monitor treatment for MRSA infections.   Urine culture Status: Abnormal   Collection Time: 04/27/16 11:27 AM  Result Value Ref Range Status   Specimen Description URINE, RANDOM  Final   Special Requests NONE  Final   Culture (A)  Final    <10,000 COLONIES/mL INSIGNIFICANT GROWTH Performed at [facility]    Report Status 04/29/2016 FINAL  Final     Labs: Basic Metabolic Panel:  Last Labs      Recent Labs Lab 04/27/16 0924 04/27/16 1427 04/28/16 0153 04/29/16 0512 04/30/16 0429 05/01/16 0453  NA 134* --  131* 131* 131* 133*  K 3.8 --  3.8 4.4 3.9 3.6  CL 101 --  96* 97* 93* 95*  CO2 26 --  27 26 29  34*  GLUCOSE 101* --  196* 139* 141* 101*  BUN 21* --  20 27* 34* 27*  CREATININE 0.82 0.81 0.98 1.21* 1.57* 1.32*  CALCIUM 9.2 --  8.8* 8.9 8.8* 8.6*     Liver Function Tests:  Last Labs     No results for input(s): AST, ALT, ALKPHOS, BILITOT, PROT, ALBUMIN in the last 168 hours.    Last Labs     No results  for input(s): LIPASE, AMYLASE in the last 168 hours.    Last Labs     No results for input(s): AMMONIA in the last 168 hours.   CBC:  Last Labs      Recent Labs Lab 04/27/16 0924 04/27/16 1427 04/28/16 0153 04/29/16 0512 04/30/16 0429 05/01/16 0453  WBC 10.4 9.2 9.2 10.9* 14.6* 11.6*  NEUTROABS 7.0 --  --  --  --  --   HGB 14.3 14.8 15.0 15.2* 14.2 13.6  HCT 43.2 43.7 42.7 43.1 41.4 39.5  MCV 87.8 87.4 83.9 84.7 85.4 84.8  PLT 190 225 189 218 204 202     Cardiac Enzymes:  Last Labs      Recent Labs Lab 04/27/16 1427 04/27/16 2027 04/28/16 0153  TROPONINI <0.03 <0.03 <0.03     BNP: BNP (last 3 results)  Recent Labs (within last 365 days)     Recent Labs  06/12/15 1648 04/23/16 1405 04/27/16 1427  BNP 55.2 34.7 117.1*      ProBNP (last 3 results)  Recent Labs (within last 365 days)     Recent Labs  08/09/15 1047  PROBNP 72.0      CBG:  Last Labs      Recent Labs Lab 04/25/16 1158 04/25/16 1703 04/25/16 2156 04/26/16 0735 04/26/16 1111  GLUCAP 173* 119* 139* 115* 255*         Signed:  Jeralyn Bennett MD.  [facility] 05/01/2016, 12:24 PM         Routing History     Date/Time From To Method   05/01/2016 12:41 PM Jeralyn Bennett, MD Michele Mcalpine, MD In Basket

## 2016-05-07 ENCOUNTER — Telehealth: Payer: Self-pay | Admitting: Internal Medicine

## 2016-05-07 ENCOUNTER — Other Ambulatory Visit: Payer: Self-pay | Admitting: Pulmonary Disease

## 2016-05-07 ENCOUNTER — Telehealth: Payer: Self-pay | Admitting: Pulmonary Disease

## 2016-05-07 NOTE — Telephone Encounter (Signed)
Per Dr. Kriste BasqueNadel: Patient needs appointment with GI ASAP for Dysphagia with Aspiration (may need a tube in stomach). ----------------------- Called and spoke with patients daughter and advised her of Dr. Jodelle GreenNadel's recommendations.   She said that she will contact GI and get an appointment ASAP. Nothing further needed.

## 2016-05-07 NOTE — Telephone Encounter (Signed)
done

## 2016-05-07 NOTE — Telephone Encounter (Signed)
Pts daughter states pt is having trouble with coughing and swallowing. States they called pulmonary and were told to call GI to be seen asap. Pt scheduled to see Doug SouJessica Zehr PA 05/09/16@2pm . Pts daughter aware of appt and knows pt should be very careful when swallowing as to avoid aspiration.

## 2016-05-07 NOTE — Telephone Encounter (Signed)
Patients daughter states that patient is coughing a lot, clearing her throat a lot.  She was in the hospital for 2 weeks with these same symptoms.  She said that they did a barium swallow last time and found that she had aspirated food into her lungs last time, daughter is concerned about it happening again.  She said that patient will not eat and she is having trouble swallowing. She can drink, just cannot eat food, daughter says that she will not even eat ice cream.  Allergies  Allergen Reactions  . Azithromycin Shortness Of Breath  . Ciprofloxacin Other (See Comments)     hallucinations  . Levofloxacin Other (See Comments)    Insomnia, indigestion, tingling sensation in legs  . Furosemide Rash    Bullous pemphigoid  . Latex Rash  . Other Rash    EKG leads caused a rash that required steroids to clear   Current Outpatient Prescriptions on File Prior to Visit  Medication Sig Dispense Refill  . acetaminophen (TYLENOL) 325 MG tablet Take 650 mg by mouth every 4 (four) hours as needed for mild pain.    [name]. acetaZOLAMIDE (DIAMOX) 250 MG tablet TAKE (1) TABLET BY MOUTH EACH MORNING. (Patient taking differently: TAKE 250 MG BY MOUTH EACH MORNING.) 30 tablet 5  . ALPRAZolam (XANAX) 0.25 MG tablet Take 1 tablet (0.25 mg total) by mouth at bedtime as needed for sleep. 10 tablet 0  . amLODipine (NORVASC) 5 MG tablet Take 1 tablet (5 mg total) by mouth daily. 30 tablet 6  . aspirin EC 81 MG tablet Take 81 mg by mouth daily.    . cetirizine (ZYRTEC) 10 MG tablet TAKE 1 TABLET BY MOUTH ONCE DAILY FOR ALLERGIES. (Patient taking differently: TAKE 10 MG BY MOUTH ONCE DAILY FOR ALLERGIES.) 30 tablet 5  . Cholecalciferol (VITAMIN D3) 2000 UNITS capsule Take 1 capsule (2,000 Units total) by mouth daily. 30 capsule 6  . fluticasone (FLONASE) 50 MCG/ACT nasal spray SPRAY 2 SPRAYS INTO EACH NOSTRIL ONCE DAILY. 16 g 5  . Fluticasone-Salmeterol (ADVAIR DISKUS) 100-50 MCG/DOSE AEPB Inhale 1 puff into the lungs 2  (two) times daily. 60 each 5  . guaifenesin (ROBITUSSIN) 100 MG/5ML syrup Take 200 mg by mouth 3 (three) times daily as needed for cough.    [name]. ipratropium-albuterol (DUONEB) 0.5-2.5 (3) MG/3ML SOLN Take 3 mLs by nebulization every 4 (four) hours as needed. 360 mL 0  . methimazole (TAPAZOLE) 5 MG tablet Take 1 tablet (5 mg total) by mouth every other day. 30 tablet 1  . Multiple Vitamin (DAILY-VITE) TABS TAKE ONE TABLET BY MOUTH ONCE DAILY. 30 tablet 11  . ondansetron (ZOFRAN) 4 MG tablet Take 1 tablet (4 mg total) by mouth every 6 (six) hours as needed for nausea or vomiting. 15 tablet 0  . polyethylene glycol (MIRALAX / GLYCOLAX) packet Take 17 g by mouth daily as needed for mild constipation.     [name]. PROAIR HFA 108 (90 Base) MCG/ACT inhaler USE 1 TO 2 PUFFS EVERY SIX HOURS AS NEEDED. 8.5 g 0  . ranitidine (ZANTAC) 150 MG tablet TAKE ONE TABLET BY MOUTH ONCE DAILY. (Patient taking differently: TAKE 150 MG BY MOUTH ONCE DAILY.) 30 tablet 0  . sertraline (ZOLOFT) 50 MG tablet TAKE (1) TABLET BY MOUTH AT BEDTIME. (Patient taking differently: Take 50 mg by mouth at bedtime. ) 30 tablet 5  . Spacer/Aero-Holding Chambers (AEROCHAMBER PLUS WITH MASK) inhaler Use as instructed 1 each 2  . torsemide (DEMADEX) 20 MG tablet  TAKE 1/2 TABLET BY MOUTH EVERY MORNING. (Patient taking differently: TAKE 20 MG BY MOUTH EVERY MORNING.) 15 tablet 11   No current facility-administered medications on file prior to visit.

## 2016-05-09 ENCOUNTER — Ambulatory Visit (INDEPENDENT_AMBULATORY_CARE_PROVIDER_SITE_OTHER)
Admission: RE | Admit: 2016-05-09 | Discharge: 2016-05-09 | Disposition: A | Discharge status: Home / Self Care | Payer: Medicare Other | Source: Ambulatory Visit | Attending: Gastroenterology | Admitting: Gastroenterology

## 2016-05-09 ENCOUNTER — Encounter: Payer: Self-pay | Admitting: Gastroenterology

## 2016-05-09 ENCOUNTER — Ambulatory Visit (INDEPENDENT_AMBULATORY_CARE_PROVIDER_SITE_OTHER): Payer: Medicare Other | Admitting: Gastroenterology

## 2016-05-09 VITALS — BP 96/50 | HR 104 | Ht 67.5 in | Wt 170.4 lb

## 2016-05-09 DIAGNOSIS — R05 Cough: Secondary | ICD-10-CM

## 2016-05-09 DIAGNOSIS — R1310 Dysphagia, unspecified: Secondary | ICD-10-CM

## 2016-05-09 DIAGNOSIS — K224 Dyskinesia of esophagus: Secondary | ICD-10-CM

## 2016-05-09 DIAGNOSIS — R059 Cough, unspecified: Secondary | ICD-10-CM

## 2016-05-09 NOTE — Progress Notes (Signed)
Agree. Can follow up with PCP for cough

## 2016-05-09 NOTE — Patient Instructions (Signed)
Please go to the basement level to our radiology department for a chest x-ray. We will be calling you with the results.

## 2016-05-09 NOTE — Progress Notes (Signed)
     05/09/2016 [name] [id number] 1922-12-30   History of Present Illness:  This is a 82 year old female who is known to Dr. Marina GoodellPerry for GERD.  She was hospitalized recently with aspiration pneumonia suspected to be from dysphagia. Esophagram showed moderate to marked esophageal dysmotility (no stricture or mass identified) and speech pathology study indicated mild oral pharyngeal dysphasia with suspected primary esophageal dysphagia and recommended dysphasia 3 solids with thin liquids.  Dr. Kriste BasqueNadel sent them here for this appt and mentioned to them about "putting a tube in her stomach" so I'm not sure if he was referring to EGD or a feeding tube.  She has poor PO intake and is losing weight according to the daughter's report.  The daughter is concerned about her persistent cough.   Current Medications, Allergies, Past Medical History, Past Surgical History, Family History and Social History were reviewed in [facility] electronic medical record.   Physical Exam: BP 96/50 mmHg  Pulse 104  Ht 5' 7.5" (1.715 m)  Wt 170 lb 6 oz (77.282 kg)  BMI 26.28 kg/m2 General: Well developed white female in no acute distress Head: Normocephalic and atraumatic Eyes:  Sclerae anicteric, conjunctiva pink  Ears: Normal auditory acuity Lungs:  Right lung with some coarse breath sounds. Heart: Regular rate and rhythm Abdomen: Soft, non-distended.  Normal bowel sounds.  Non-tender. Musculoskeletal: Symmetrical with no gross deformities  Extremities: No edema  Neurological: Alert oriented x 4, grossly non-focal Psychological:  Alert and cooperative. Normal mood and affect  Assessment and Recommendations: -82 year old female with recent suspected aspiration pneumonia suspected to be from dysphagia. Esophagram showed moderate to marked esophageal dysmotility and speech pathology study indicated mild oral pharyngeal dysphasia with suspected primary esophageal dysphagia and recommended dysphasia 3  solids with thin liquids. She should continue her diet, taking extreme caution when eating. There is certainly no utility for EGD in the situations.  Dr. Kriste BasqueNadel mentioned to them about "putting a tube in her stomach" so I'm not sure if he was referring to EGD or a feeding tube. If feeding tube is necessary due to poor by mouth intake and continued weight loss then that should be performed by interventional radiology.  Daughter is concerned about her persistent cough. We will check a 2 view chest x-ray today.

## 2016-05-10 ENCOUNTER — Telehealth: Payer: Self-pay | Admitting: Pulmonary Disease

## 2016-05-10 MED ORDER — HYDROCODONE-HOMATROPINE 5-1.5 MG/5ML PO SYRP: 5.0000 mL | ORAL_SOLUTION | ORAL | Status: DC | PRN

## 2016-05-10 NOTE — Telephone Encounter (Signed)
Patients daughter states that patient has a continuous cough, non-stop all day.  Coughing out very little mucus.  She has lost 9lbs in 2 weeks, not wanting to eat. She had CXR done yesterday and it showed that patient has chronic bronchitis.  (in Epic) patients daughter would like a cough syrup to help with the cough.  Current Outpatient Prescriptions on File Prior to Visit  Medication Sig Dispense Refill  . acetaminophen (TYLENOL) 325 MG tablet Take 650 mg by mouth every 4 (four) hours as needed for mild pain.    [name]. acetaZOLAMIDE (DIAMOX) 250 MG tablet TAKE (1) TABLET BY MOUTH EACH MORNING. (Patient taking differently: TAKE 250 MG BY MOUTH EACH MORNING.) 30 tablet 5  . ALPRAZolam (XANAX) 0.25 MG tablet Take 1 tablet (0.25 mg total) by mouth at bedtime as needed for sleep. 10 tablet 0  . amLODipine (NORVASC) 5 MG tablet Take 1 tablet (5 mg total) by mouth daily. 30 tablet 6  . aspirin EC 81 MG tablet Take 81 mg by mouth daily.    . cetirizine (ZYRTEC) 10 MG tablet TAKE 1 TABLET BY MOUTH ONCE DAILY FOR ALLERGIES. (Patient taking differently: TAKE 10 MG BY MOUTH ONCE DAILY FOR ALLERGIES.) 30 tablet 5  . Cholecalciferol (VITAMIN D3) 2000 UNITS capsule Take 1 capsule (2,000 Units total) by mouth daily. 30 capsule 6  . fluticasone (FLONASE) 50 MCG/ACT nasal spray SPRAY 2 SPRAYS INTO EACH NOSTRIL ONCE DAILY. 16 g 5  . Fluticasone-Salmeterol (ADVAIR DISKUS) 100-50 MCG/DOSE AEPB Inhale 1 puff into the lungs 2 (two) times daily. 60 each 5  . guaifenesin (ROBITUSSIN) 100 MG/5ML syrup Take 200 mg by mouth 3 (three) times daily as needed for cough.    [name]. ipratropium-albuterol (DUONEB) 0.5-2.5 (3) MG/3ML SOLN Take 3 mLs by nebulization every 4 (four) hours as needed. 360 mL 0  . methimazole (TAPAZOLE) 5 MG tablet Take 1 tablet (5 mg total) by mouth every other day. 30 tablet 1  . Multiple Vitamin (DAILY-VITE) TABS TAKE ONE TABLET BY MOUTH ONCE DAILY. 30 tablet 11  . ondansetron (ZOFRAN) 4 MG tablet Take 1 tablet  (4 mg total) by mouth every 6 (six) hours as needed for nausea or vomiting. 15 tablet 0  . polyethylene glycol (MIRALAX / GLYCOLAX) packet Take 17 g by mouth daily as needed for mild constipation.     [name]. PROAIR HFA 108 (90 Base) MCG/ACT inhaler USE 1 TO 2 PUFFS EVERY SIX HOURS AS NEEDED. 8.5 g 0  . ranitidine (ZANTAC) 150 MG tablet TAKE ONE TABLET BY MOUTH ONCE DAILY. 30 tablet 5  . sertraline (ZOLOFT) 50 MG tablet TAKE (1) TABLET BY MOUTH AT BEDTIME. (Patient taking differently: Take 50 mg by mouth at bedtime. ) 30 tablet 5  . Spacer/Aero-Holding Chambers (AEROCHAMBER PLUS WITH MASK) inhaler Use as instructed 1 each 2  . torsemide (DEMADEX) 20 MG tablet TAKE 1/2 TABLET BY MOUTH EVERY MORNING. (Patient taking differently: TAKE 20 MG BY MOUTH EVERY MORNING.) 15 tablet 11   No current facility-administered medications on file prior to visit.   Allergies  Allergen Reactions  . Azithromycin Shortness Of Breath  . Ciprofloxacin Other (See Comments)     hallucinations  . Levofloxacin Other (See Comments)    Insomnia, indigestion, tingling sensation in legs  . Furosemide Rash    Bullous pemphigoid  . Latex Rash  . Other Rash    EKG leads caused a rash that required steroids to clear

## 2016-05-10 NOTE — Telephone Encounter (Signed)
Per SN: OTC Delsym or Robitussin DM.  If she would prefer, can do Hycodan #6oz 1tsp Q4H prn cough.  Called spoke with patient's daughter [name] who reported pt has Robitussin and they will continue this but would still like Rx Hycodan.  She is aware she will need to pick this up and that SN is not in the office this afternoon to sign Rx.  She is okay to wait until tomorrow.   Rx printed for SN to sign and placed on his cart

## 2016-05-11 NOTE — Telephone Encounter (Signed)
Patients daughter notified that Rx is at front to be picked up. She said that patient is doing much better today and eating better today, not as irritable.  She said that it will be next week before she can come by to pick up.   Nothing further needed.

## 2016-05-14 ENCOUNTER — Ambulatory Visit: Payer: Medicare Other | Admitting: Pulmonary Disease

## 2016-05-15 ENCOUNTER — Other Ambulatory Visit: Payer: Self-pay | Admitting: Pulmonary Disease

## 2016-05-21 ENCOUNTER — Encounter: Payer: Self-pay | Admitting: Pulmonary Disease

## 2016-05-21 ENCOUNTER — Ambulatory Visit (INDEPENDENT_AMBULATORY_CARE_PROVIDER_SITE_OTHER): Payer: Medicare Other | Admitting: Pulmonary Disease

## 2016-05-21 VITALS — BP 130/70 | HR 86 | Temp 97.8°F | Ht 67.5 in | Wt 175.2 lb

## 2016-05-21 DIAGNOSIS — I10 Essential (primary) hypertension: Secondary | ICD-10-CM | POA: Diagnosis not present

## 2016-05-21 DIAGNOSIS — I4510 Unspecified right bundle-branch block: Secondary | ICD-10-CM

## 2016-05-21 DIAGNOSIS — E0580 Other thyrotoxicosis without thyrotoxic crisis or storm: Secondary | ICD-10-CM

## 2016-05-21 DIAGNOSIS — J410 Simple chronic bronchitis: Secondary | ICD-10-CM

## 2016-05-21 DIAGNOSIS — F419 Anxiety disorder, unspecified: Secondary | ICD-10-CM

## 2016-05-21 DIAGNOSIS — M159 Polyosteoarthritis, unspecified: Secondary | ICD-10-CM

## 2016-05-21 DIAGNOSIS — I872 Venous insufficiency (chronic) (peripheral): Secondary | ICD-10-CM | POA: Diagnosis not present

## 2016-05-21 DIAGNOSIS — R609 Edema, unspecified: Secondary | ICD-10-CM

## 2016-05-21 DIAGNOSIS — R269 Unspecified abnormalities of gait and mobility: Secondary | ICD-10-CM

## 2016-05-21 DIAGNOSIS — M150 Primary generalized (osteo)arthritis: Secondary | ICD-10-CM

## 2016-05-21 NOTE — Patient Instructions (Signed)
Today we updated your med list in our EPIC system...    Continue your current medications the same...  Careful w/ eating & swallowing...  Increase your activities...  Let's plan a follow up visit in 4-6 weeks, sooner if needed for problems.[name]..[name]

## 2016-05-21 NOTE — Progress Notes (Signed)
Subjective:    Patient ID: [name], female    DOB: 1923-09-14, 82 y.o.   MRN: [id number]  HPI 82 y/o WF here for a follow up visit... she has multiple medical problems as noted below...  Followed for general medical purposes w/ hx chr obstructive asthma, severe episodic dyspnea from anxiety, HBP, RBBB, Hypercholesterolemia, borderline DM, DJD, LBP w/ sp stenosis, etc... ~  SEE PREV EPIC NOTES FOR THE OLDER DATA >>    CTAngio Chest 05/2009 showed no evid of PE, biapical pleuroparenchymal scarring otherw clear lungs, no adenopathy/ effusions/ etc...  CXR 3/15 showed norm heart size, clear lungs, elev of right hemidiaph, NAD...  LABS 3/15:  Chems- wnl;  CBC- wnl;  BNP=26...  LABS 5/15:  Chems- wnl x BS=120;  CBC- wnl;  BNP= 225    CXR 1/16 showed norm heart size, clear lungs, mild right diaph eventration- no change, Tspine DJD. DISH/ osteopenia; NAD...   2DEcho 1/16 showed norm LV size & function w/ EF=65-70%, AoV leaflets mildly thickened w/o AS, MV leaflets mod thickened w/ trivMR, mild RA dil, PAsys=66mHg...  LABS 1/16:  Chems- wnl w/ Cr=0.95;  BNP=66;  CBC- wnl w/ Hg=14.4..Marland KitchenMarland Kitchen CXR 4/16 in ER> norm heart size, atherosclerosis of Ao, no edema or consolidation in lungs, eventration of right hemidiaph- no change, DJD spine.  EKG 4/16 in ER> NSR, rate72, PACs, LAD, RBBB  LABS 4/16 in ER> Chems- wnl x K=3.0;  CBC- wnl;  Troponin=neg;  BNP=69   ~  June 09, 2015:  231moOV & Gertie indicates that she is doing satis, feels well, no new complaints or concerns, but there is stillalot of tension betw her & daughter [name] who does a fabulous job looking after & caring for her mother... They now have a lady who comes in 4PM-9PM to help [name] she is not happy- "we just sit and eat ice cream"; [name] (tearful) notes no further anxiety/ panic attacks in the eve hours, mother doesn't want to accept help;  Counseling is recommended but they decline...     Chr Obstructive Asthma> her  dyspnea is from anxiety/panic not asthma; on Advair100Bid (but only using it prn) & Proventil rescue prn; notes breathing back to baseline & prn Klonopin helps dyspnea but she won't use it...    HBP> on ASA81, Norvasc5, Lasix20, K10-2/d; BP=126/80, tol meds well; denies CP, palpit, ch in SOB, tr edema...    CHOL> on diet alone, refuses meds, FLP 3/14 shows TChol 177, TG 50, HDL 60, LDL 107    DM> on diet alone, wt stable at 172#, BS=100-120, last A1c (2/13) was 6.3 & she knows to restrict carbs etc...    GI- Reflux, Divertics, constip> prev on Protonix40, Miralax, Senakot-S; continue same meds.. [name]  DJD/ LBP> on Pred10, OTC analgesics prn & osteobiflex prn; had right THR 2011; known sp stenosis w/ prev ESI... We reviewed prob list, meds, xrays and labs>  IMP/PLAN>>  Mother & [name] really need counseling but it's not going to happen; they are both wonderful people- Mother [age]y/ond has mild senile dementia, daughter very loving & cares for mother deeply, worries, etc; mother needs med supervision and monitoring but refuses AL etc; and so it goes...   ~  July 21, 2015:  6wk ROV & add-on appt after 2 ER visits for dyspnea precipitated by anxiety> as prev noted- very difficult situation w/ 9073/o mother who insists on living alone, doing her own meds, etc but incapable of doing  all that needs to be done & remembering her meds etc;  Daughter has done all she can do as there is alot of history in their relationship- every visit the daughter is the one who cries and mother is just angry;  They have a woman who stays with the pt at night but she won't allow help during the day;  Episodes of dyspnea are ppt by panic attacks yet pt won't take benzos regularly & forgets to take them w/ these attacks- just calls the daugh & they freq wind up in the ER... Pt's son thinks her use of the rescue inhaler is an "upper" & causes problems, he wants her on oxygen instead & I reviewed Medicare guidelines- we will check  ambulatory oxygen sat test today & sched ONO, otherw I offered to order Oxygen for them to self-pay but they decline... Pt flat out refused my rec for Klonopin Bid, she has Xanax 0.5m tabs which she uses sparingly, and given Ativan183mfrom ER but says she doesn't have this med...      ER visit 07/15/15> c/o episode of SOB lasting 2067m ppt by [facility], Exam was neg w/ norm VS & O2sat=98% on RA, she had sl tender right chest wall, CXR showed norm heart size/ clear lungs/ NAD, EKG showed NSR/ rate88/ RBBB/ LAD, old infer scar/ no acute changes, LABS wnl & enz were neg... Symptoms resolved spont & she was disch home...      ER visit 06/12/15> another episode of SOB & no insight into cause but daugh indicates it was from anxiety/panic when she was home alone; Exam clear, VSS;  CXR was again wnl & Labs wnl as well; they gave her Ativan to try but she hasn't used it; she was even given an aerochamber to use w/ her inhaler but she won't use that... EXAM reveals Afeb, VSS, O2sat=95% on RA;  HEENT- neg;  Chest- clear w/o w/r/r, sl tender on palp;  Heart- RR gr1/6 SEM no r/g;  Abd- soft, non-tender, neg;  Ext- VI, tr edema, no c/c;  Neuro- intact, walks w/ cane, anxious... We reviewed prob list, meds, xrays and labs> SHE DID NOT BRING MED BOTTLES OR LIST TO THE OV TODAY- reminded to do so for every visit!  CXR 7/24 & 07/15/15 showed norm heart size, tortuous Ao, clear lungs w/ mild elev of right hemidiaph, NAD... Marland KitchenMarland KitchenKG 07/15/15 showed chronic changes- NSR/ rate88/ RBBB/ LAD/ old infer scar/ no acute abnormalities...  LABS 7-06/2015> Chems- wnl x BS=127-147;  BNP=55;  Troponins=neg;  CBC- wnl IMP/PLAN>>  I had another long talk w/ Gwendolyn Schmidher daughter ShiEnid [name] have again rec that she use a low dose of the ALPRAZOLAM 0.5mg70mbs- 1/2 tab Tid regularly at breakfast, lunch, & dinner; she may also take an extra 1/2 tab prn anytime she feels anxious or panic setting in;  We have suggested  similar plan many times in the past & for whatever reason she will not do it!  I have offered Psyche referral or to set her up w/ a counselor but this too is declined;  ShirEnid Derryhaving a very hard time w/ her mother's attitude, lack of cooperation, mild dementia at age 20, 51c...   ~  October 24, 2015:  7mo 21mo& Jeananne appears to be stable- mult chr complaints but nothing new & doing reasonably well;  She has moved to [facility]/[place] & has a sitteActuaryd  by the family Basilia Jumbo) and this arrangement seems to be helping (pt likes [place] & they get along but pt never misses an opportunity to "stick-it" to her daugh or demean her in some way)... We reviewed the following medical problems during today's office visit >>     Chr Obstructive Asthma> her dyspnea is from anxiety/panic not asthma; on Advair100Bid ([facility] requires regular Rx directions) & Proventil rescue prn; notes breathing back to baseline & prn Klonopin helps dyspnea...    HBP> on ASA81, Norvasc5, off Lasix20, K10-2/d; BP=140/76, tol meds well; denies CP, palpit, ch in SOB, tr edema...    CHOL> on diet alone, refuses meds, FLP 3/14 shows TChol 177, TG 50, HDL 60, LDL 107    DM> on diet alone, wt up sl at 179#, BS in epic=94-127, last A1c (2/13) was 6.3 & she knows to restrict carbs etc...    GI- Reflux, Divertics, constip> prev on Protonix40, now on Zantac150, Miralax; continue same meds.[name]    DJD/ LBP> prev on Pred10, OTC analgesics prn & osteobiflex prn; had right THR 2011; known sp stenosis w/ prev ESI... We reviewed prob list, meds, xrays and labs> she had the 2016 Flu vaccine... IMP/PLAN>>  Stable overall, continue current meds, rec to increase exercise & decr intake (work on wt reduction);  We plan ROV recheck w/ blood work in 3 mo...  ~  December 29, 2015:  23moROV & MLevy Sjogrenand her daugh continue to be locked in battle regarding her care (resides at [facility] & has daytime sitter RMount Taylorwhom she loves); she persists w/ mult  somatic complaints- SOB, irritable, throbbing in her legs, etc;  She saw TP 11/22/15 w/ CC insomnia & fatigue, and her Thyroid function had not been checked for awhile=> labs showed TSH=0.05 and FreeT4=1.70 (prev TSH was 1.93 in 2014); she was referred to DrGherghe & seen 12/01/15> exam of neck was neg, TFT labs proved thyrotoxicosis, TSI was neg as was a Sed rate (14); they chose to try low dose Methimazole rx (533md) as opposed to scan/further testing; she has f/u appt w/ DrGherghe pending...     Breathing is stable on Advair100Bid, plus Flonase,Zyrtek, AlbutHFA prn; she continues to c/o SOB/ DOE...    BP is controlled on Amlod5, Demadex20, K10Bid; BP= 116/58 & she denies CP, palpit, ch in edema, etc...    GI- controlled on Zantac150, Miralax...    She remains on Xanax0.25Bid & Desyrel50Qhs prn sleep (she is out of Zoloft)... EXAM reveals Afeb, VSS, O2sat=95% on RA;  HEENT- neg;  Chest- clear w/o w/r/r;  Heart- RR gr1/6 SEM no r/g;  Abd- soft, non-tender, neg;  Ext- VI, tr edema, no c/c;  Neuro- intact, walks w/ cane, anxious...  LABS 11/2015 in Epic>  TSH=0.05 (confirmed), FreeT4=1.70, FreeT3=4.0, Thy Stim Ig was wnl;  Sed=14...  LABS 12/29/15> [name] insisted on recheck CBC & Iron level>  CBC- wnl w/ Hg=14.6, Fe=99 (31%sat);  Chems- abn w/ Na=130, TCO=39, Cr=1.2 IMP/PLAN>>  We checked the above labs, reminded to use Tylenol prn, no salt, elevate, etc; we decided to decr the Demadex to 1/2 tab Qam & ADD DIUXLKGM010ne tab in the afternoon; she is reassured about her health & asked to f/u w/ DrGherghe for Endocrine...  ~  February 09, 2016:  6wk ROV & today is [name]'s 89st birthday!  Da[place] that pt is better on the Tapazole- less anxious, mood swings diminished, sleeping better, etc; she notes that edema is increased (on Demadex20-1/2 & Diamox250)-  reminded to elim sodium etc... We reviewed the following medical problems during today's office visit >>     Chr Obstructive Asthma> her dyspnea is from  anxiety/panic not asthma; on Advair100Bid ([facility] requires regular Rx directions) & Proventil rescue prn; notes breathing back to baseline & Alp[raz0.25 helps dyspnea...    HBP> on ASA81, Norvasc5, Demadex20-1/2, Diamox250, K10-2/d; BP=140/70, tol meds well; denies CP, palpit, ch in SOB, but incr edema noted recently...    CHOL> on diet alone, refuses meds, last FLP 3/14 shows TChol 177, TG 50, HDL 60, LDL 107    DM> on diet alone, wt down to 166#, BS in epic=100-150, last A1c (2/13) was 6.3 & she knows to restrict carbs etc...    Thyrotoxicosis> on Tapazole5 per DrGherghe; f/u TFTs 01/10/16 showed TSH=2.39 and she is clinically improved...    GI- Reflux, Divertics, constip> prev on Protonix40, now on Zantac150, Miralax; continue same meds.[name]    DJD/ LBP> prev on Pred10, OTC analgesics prn & osteobiflex prn; had right THR 2011; known sp stenosis w/ prev ESI...    Anxiety, mild senile dementia, difficult interaction betw pt & daugh> on Xanax0.25Bid & Zoloft50 Qhs EXAM reveals Afeb, VSS, O2sat=93% on RA;  HEENT- neg, no thyroid nodule palp;  Chest- clear w/o w/r/r;  Heart- RR gr1/6 SEM no r/g;  Abd- soft, non-tender, neg;  Ext- VI, w/1+ edema, no c/c;  Neuro- intact, walks w/ cane, anxious...  CXR 01/11/16 showed borderline cardiomeg, elev right hemidiaph, clear lungs, DJD in Tspine...  LABS 12/2015>  Chems- ok w/ BS=152;  CBC- ok w/ Hg=14.1;  TSH=2.39 on Tapazole5... IMP/PLAN>>  We reviewed need for no salt, elev legs, wear support hose; decided to incr Demedex20 Qam & continue the Diamox250/d; we plan ROV recheck in 50mo..  ~  May 21, 2016:  3452moOV & post Hosp check>  Since she was last here MaLevy Sjogrenas had several follow up visits w/ specialists [facility]    She saw DrGherghe 03/01/16>  Throtoxicosis on  Tapazole 52m28mod; they opted not to pursue diagnostic eval (adenoma vs Graves);  TSH=0.03 11/2015 7 improved to 2.39 on 01/10/16...    Seen by DrMayer- Podiatry 04/19/16> bilat big toenails,  onychomycosis w/ debridement performed...     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    Above prob list reviewed... EXAM reveals Afeb, VSS, O2sat=95% on RA;  Wt= 175#;  HEENT- neg, no thyroid nodule palp;  Chest- clear w/o w/r/r;  Heart- RR gr1/6 SEM no r/g;  Abd- soft, non-tender, neg;  Ext- VI, w/1+ edema, no c/c;  Neuro- intact, walks w/ cane, anxious...  CXR 04/2016>  Aortic atherosclerosis, elev right hemidiaph, clear lungs, DDD...   CT Head 04/23/16>  Mucosal membrane thickening 7  sm amt fluid in left max sinus, otherw unremarkable- no ischemia or infarct...  CT Chest 04/23/16>  Atherosclerotic Ao & coronaries, no adenopathy, sm thyroid nodules noted no change from 2010, elev right hemidiaph & some basilar atx, DJD in spine w/ spurs & 40% T11 compression  Ba Esophagram 04/30/16>  Mod to severe esoph dysmotility noted, no mass or stricture...  LABS 04/2016>  Chems- ok x Cr=1.3-1.6, BS=100-140;  CBC- wnl;  Thyroid- ok on Tapazole... IMP/PLAN>>  [name] is improved & back to baseline; she knows to take her time & be careful eating;  Continue same meds; incr activity level; ROV in 4-6 weeks...           Problem List:       DYSPNEA (ICD-786.05) - long hx of chronic obstructive asthma treated w/ ADVAIR100Bid & PROAIR (she uses them Prn now)... she is a non-smoker w/  some reactive airways disease in the past & retired from [employer] after 33 years in 1991... she denies cough, sputum, hemoptysis, worsening dyspnea, wheezing, chest pains, snoring, daytime hypersomnolence, etc...  ~  baseline CXR w/o acute changes...  ~  PFT's 5/02 w/ FVC 2.07 (68%), FEV1=1.36 (57%), and FEV1/FVC ratio=66%, mid-flows 42%... ~  CT Angio 7/10 was neg- x biapical pleuroparenchymal scarring... ~  CXR 10/11 showed sl elev right hemidaiph, mild DJD sp, osteopenia, NAD.[name]. ~  CXR 6/12 showed mild apical scarring, clear & NAD, DJD sp w/ osteophytes... ~  Intermittent dyspnea more related to anxiety & treated w/ KLONOPIN 0.27m 1/2 to 1 tab Bid==> improved. ~  CXR 4/13 showed normal heart size, clear lungs, DJD in TSpine... ~  CXR 2/14 showed normal heart size, clear lungs w/ sl peribronch thickening, DJD in spine, NAD..[name] ~  CXR 3/15 showed norm heart size, clear lungs, elev of right hemidiaph, NAD... ~  She is encouraged to take the Klonopin 0.568mBid regularly- consider taking 1/2 in AM, 1/2 in afternoon, one at bedtime... ~  1/16: she continues w/ mult somatic complaints and intermittent choking episodes etc; she refuses to take the Advair or Klonopin... ~  CXR 1/16 showed norm heart size, clear lungs, mild right diaph eventration- no change, Tspine DJD. DISH/ osteopenia; NAD... ~  3/16: she is stable on current meds, asked to incr her non-existent exercise program... ~  4/16: went to ER w/ SOB- nothing found x mild edema; given Lasix40, potassium was low, thought to be anxious; in office f/u her daugh confirms anxiety & pt not taking her meds regularly; asked to take meds every day & try the Klonopin... ~  7/16: same thing- went to ER w/ dyspnea, nothing found & given Ativan but she wouldn't take it... ~  8/16: another episode w/ ER eval and symptoms resolved spontaneously on there own... ~  9/16: pt is advised (again) to start regular dosing of Alpraz 0.45m52m 1/2 tab Tid w/ extra 1/2 tab  as needed for nerves... ~  12/16: she remains on Advair100-2spBid & Alpraz0.245m27m=> improved... ~  3/17: dyspnea further improved w/ Tapazole45mg/46mx for hyperthyroidism...  HYPERTENSION (ICD-401.9) - controlled on NORVASC 45mg d17my & HCTZ 245mgta54mily...  ~  2/13:  BP 144/70 today> tol rx well & denies HA, visual changes, CP, palipit, dizziness, syncope, edema, etc... ~  4/13:  BP= 148/80 & she denies CP, palpit, edema; dyspnea improved w/ Klonopin. ~  6/13:  BP= 132/68 & as noted she has mult somatic complaints... ~  3/14:  on Norvasc10, HCTZ25; BP=136/60, tol meds well; denies  CP, palpit, ch in SOB, edema, etc  ~  9/14:  BP controlled on Amlod5 & Hct25-1/2 daily; BP= 130/70 & she denies CP, palpit, dizzy, SOB, edema, etc. ~  3/15: on ASA81, Norvasc5, Lasix20- 1-2/d; BP=136/70, tol meds well; denies CP, palpit, ch in SOB, but notes persist edema in legs; Rec to ch Amlod5 to Losar50, low sodium, elev legs, Lasix40. ~  5/15: on Losar50, Lasix40;  BP= 138/60 & she denies angina pain, palpit, ch in SOB, etc... ~  7/15: on Losar50, she stopped Lasix; BP= 138/78 and they want to go back on Amlod5 + HCT12.5 daily... ~  9/15: on Amlod5, Hct12.5; BP= 128/80 & she likes this combo better- denies CP, palpit, etc... ~  1/16: on Amlod5, Hct12.5 but ?what she is taking; BP= 130/80 & she has refused the ARBs... ~  3/16: on Amlod5, Hct12.5; BP= 142/80 & she has gained 9# w/ 1-2+ edema; discussed low sodium & change Hct to LASIX20 Qam... ~  5/16: on Amlod5, Lasix20, K20; BP=148/70, recent K=3.0 & daugh notes she's not taking meds regularly; asked to take meds everyday & incr K20Bid... ~  2/17: BP is controlled on Amlod5, Demadex20, K10Bid; BP= 116/58 & she denies CP, palpit, ch in edema, etc...  RIGHT BUNDLE BRANCH BLOCK (ICD-426.4) - on ASA 12m/d... baseline EKG w/ RBBB and 2DEcho 5/02 showed mild asymmetric LVH w/ incr EF... ~  12/15: she had Cards eval by DrNishan> he rec ARB/diuretic but she refused to  switch from her Amlod5/ Hct12.5 regimen; she refused EKG due to "allergy to the electrodes"... ~  2DEcho 1/16 showed norm LV size & function w/ EF=65-70%, AoV leaflets mildly thickened w/o AS, MV leaflets mod thickened w/ trivMR, mild RA dil, PAsys=341mg... ~  EKG 4/16 showed NSR, rate72, PACs, LAD, RBBB  VENOUS INSUFFIC & EDEMA >>  ~  3/15: she was switched off Amlod & onto Losar50, Lasix20=>40, plus no salt, elevation, support hose, etc...  ~  [facility]15: [facility]; she has VI & 1+edema but stable, no acute changes... 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 ~  3/16: she remains on the Pred from Derm, now w/ incr edema & 9# wt gain, rec no salt & change Hct to LASIX20/d... ~  5/16: improved on Lasix20 w/ edema decr 7 wt down several lbs... ~  12/16: on ASA81, Norvasc5, off Lasix20, K10-2/d; BP=140/76, tol meds well; denies CP, palpit, ch in SOB, tr edema. ~  3/17: edema increased w/ decr in diuretics and [facility] Demadex20/d + Diamox250/d and K10Bid...  HYPERLIPIDEMIA (ICD-272.4) - on diet alone... she forgets to come to visits FASTING for this blood work ~  [place] showed TChol 205, TG 71, HDL 49, LDL 130... ~  FLP 6/12 on diet alone showed TChol 218, TG 50, HDL 66, LDL 129 ~  FLP 2/13 on diet alone showed TChol 171, TG 43, HDL 66, LDL 97 ~  FLP 3/14 on diet alone showed TChol 177, TG 50, HDL 60, LDL 107 ~  She needs to ret FASTING for f/u FLP...  DIABETES MELLITUS, BORDERLINE (ICD-790.29) - on diet alone w/ prev BS's in the 100-160 range... ~  labs in 2008-9 showed BS= 101 to 108 ~  labs 1/10 showed BS= 106, A1c= 5.9 ~  Labs 6/12 showed BS= 93, A1c= 6.6...Marland KitchenMarland Kitchenec diet, exercise... ~  Labs 2/13 showed BS= 92,  A1c= 6.3 ~  3/14: on diet alone, wt stable ~174#, BS=97, last A1c (2/13) was 6.3 & she knows to restrict carbs etc. ~  Labs 3/15 showed BS= 95; and BS= 120 in [id number]...  ~   Labs 2016 showed BS= 101-135 on diet alone... ~  Labs in 2017 showed BS= 100-150 range on diet alone  THYROTOXICOSIS >> routine labs 11/2015 showed thyrotoxicosis & referred to Endocrine, DrGherghe- Rx w/ Tapazole54m/d & she is clinically improved... ~  Labs 2/13 showed TSH= 2.86 ~  Labs 3/14 showed TSH=1.93 ~  Labs 1/17 showed TSH=0.03-0.09;  FreeT3=4.0;  FreeT4=1.70 => referred to DrGherghe, started on Tapazole5 ~  Labs 2/17 showed TSH=2.93;  FreeT3=2.5;  FreeT4=0.76  INDIGESTION/ REFLUX SYMPTOMS >> see 10/12 note & PROTONIX 468md started, further eval if symptoms persist... ESOPHAGEAL DYSMOTILITY/ PRESBYESOPHAGUS >>  ~  4/13:  She notes some reflux symptoms and excess gas w/ belching; rec to take the Protonix daily & Simethacone vs Tums which she says helps her gas. ~  5/13:  She saw GI DrPerry w/ rec to take Prilosec for her indigestion... ~  5/15:  She presented w/ worsening indigestion, dysphagia, reflux & Prev30 was incr to Bid w/ GI f/u suggested for EGD... 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 ~  9/15:  Symptoms persist but she is not regurg or vomiting, and weight stable; they want to change to Nexium40Bid-OK, and rec proceed w/ MBS by speech path... ~  She continues to have intermit choking episodes and c/o phlegm in her throat; she has tried "everything" & encouraged to f/u w/ GI- DrPerry/ DrJEdwards... ~  She stopped the PPI rx in favor of Zantac150...  DIVERTICULOSIS OF COLON (ICD-562.10) - she takes SENAKOT-S, MIRALAX, Peppermint Tea, & sauerkraut Prn...last colonoscopy 9/02 by DrPerry was WNL...  PYELONEPHRITIS (ICD-590.80) - SEE 1/09 Hospitalization (reviewed)... ~  She saw  DrMacDiarmid for her recurrent UTIs, chronic cystitis, urge & stress incont, nocturia; she is INTOL to CiBurt.  Hx of BREAST CYST (ICD-610.0)  DEGENERATIVE JOINT DISEASE (ICD-715.90) - s/p right hip hemiarthroplasty 11/09 by DrAplington w/ wound complic... then dx w/ loosening of the femoral shaft & had conversion to right THR by DrAlusio 10/11 & much improved... she uses CELEBREX 20069mrn (seldom takes this).  LOW BACK PAIN SYNDROME (ICD-724.2) & SPINAL STENOSIS (ICD-724.00) - severe LBP & spinal stenosis w/ evals by DrRamos & DrNudelman... s/p shots, considering poss surgery vs alternative therapies... she takes Celebrex, Osteobiflex, MVI, Vit D... ~  8/10: eval by DrAplington- diff leg lengths, lift placed in right shoe, then trial Lyrica50m45m ~  12/11:  improved after hip revision surg (to THR) 10/11 w/ better ambulaton... ~  5/13:  DrRamos gave her another ESI for her leg pain related to sp stenosis... ~  3/14:  C/o neuropathic discomfort in legs- eval by Ramos & offered shots in her back; try Lyrica50 in the interim... ~  Persistent leg pain, on OTC "natural" meds she says; encouraged to f/u w/ DrRamos et al...  Hx of ANEMIA (ICD-285.9) - eval by GI in 2002 showed normal EGD and Colon... prob iron malabsorption problem Rx'd w/ Fe infusion... ~  labs 1/10 showed Hg= 14.6, MCV= 89, Fe= 94 ~  labs 12/11 showed Hg= 12.8, MCV= 90, Fe= 33... try Fe supplement + VitC... ~  Labs 6/12 showed Hg= 15.0 ~  Labs 2/13 showed Hg= 14.6 ~  Labs 2/14 showed Hg= 14.8 ~  Labs 3/15 showed Hg= 14.9 ~  Labs 1/16 showed Hg= 14.4 ~  Labs 4/16 showed Hg= 13.7 ~  Labs 2/17 showed Hg= 14.1  DERM:  rash Rx'd by dermatology- OLUX-E foam= clobetasol Foam 0.05%... 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 ~  5/16: she is still on Pred per Derm- currently 87m/d...    Past Surgical History  Procedure Laterality Date  . Cataract  extraction    . Right hip hemiarthroplasty      total  . Conversion to right thr      Outpatient Encounter Prescriptions as of 10/24/2015  Medication Sig  . ALPRAZolam (XANAX) 0.25 MG tablet Take 1 tablet by mouth twice daily  . aspirin EC 81 MG tablet Take 81 mg by mouth daily.  . Cholecalciferol (VITAMIN D3) 2000 UNITS capsule Take 1 capsule (2,000 Units total) by mouth daily.  . diphenhydramine-acetaminophen (TYLENOL PM) 25-500 MG TABS tablet Take 1 tablet by mouth at bedtime as needed.  . Fluticasone-Salmeterol (ADVAIR DISKUS) 100-50 MCG/DOSE AEPB Inhale 1 puff into the lungs 2 (two) times daily.  . Multiple Vitamin (MULTIVITAMIN WITH MINERALS) TABS tablet Take 1 tablet by mouth daily. Centrum Silver  . NORVASC 5 MG tablet TAKE 1 TABLET ONCE DAILY.  .Marland Kitchenpolyethylene glycol (MIRALAX / GLYCOLAX) packet Take 17 g by mouth daily as needed for mild constipation.   . potassium chloride (K-DUR,KLOR-CON) 10 MEQ tablet Take 2 tablets (20 mEq total) by mouth daily.  .Marland KitchenPROAIR HFA 108 (90 BASE) MCG/ACT inhaler INHALE 2 PUFFS EVERY 4 HOURS AS NEEDED  . ranitidine (ZANTAC) 150 MG tablet Take 150 mg by mouth daily.  . sertraline (ZOLOFT) 50 MG tablet TAKE (1) TABLET BY MOUTH AT BEDTIME.  .Marland KitchenSpacer/Aero-Holding Chambers (AEROCHAMBER PLUS WITH MASK) inhaler Use as instructed  . Alum & Mag Hydroxide-Simeth (MAGIC MOUTHWASH W/LIDOCAINE) SOLN Take 5 mLs by mouth 4 (four) times daily as needed for mouth pain. (Patient not taking: Reported on 10/24/2015)  . BENZOCAINE, DENTAL, (ANBESOL MAXIMUM STRENGTH) 20 % LIQD Place 1 application onto teeth daily as needed (gum irritation).  . ferrous sulfate 325 (65 FE) MG tablet Take 325 mg by mouth daily with breakfast.(Pt not taking 10/24/15)   . furosemide (LASIX) 20 MG tablet Take 1 tablet (20 mg total) by mouth daily. (Patient not taking: Reported on 10/24/2015)  . magnesium hydroxide (MILK OF MAGNESIA) 800 MG/5ML suspension Take 30 mLs by mouth daily as needed for  constipation. (Pt not taking 10/24/15)  . OVER THE COUNTER MEDICATION Take 1 application by mouth daily as needed (sinus congestion). Rawleigh's Salve - apply to nostrils (Pt not taking 10/24/15)  . predniSONE (DELTASONE) 10 MG tablet Take 5 mg by mouth See admin instructions. Take 1/2 tablet (5 mg) daily for 3 days for itching; hold for 10 days, then restart if needed for itching rash on back. (Pt not taking 10/24/15)    Allergies  Allergen Reactions  . Azithromycin Shortness Of Breath  . Ciprofloxacin Other (See Comments)     hallucinations  . Levofloxacin Other (See Comments)    Insomnia, indigestion, tingling sensation in legs  . Furosemide Rash    Bullous pemphigoid  .  Latex Rash  . Other Rash    EKG leads caused a rash that required steroids to clear    Current Medications, Allergies, Past Medical History, Past Surgical History, Family History, and Social History were reviewed in [employer] record.    Review of Systems         See HPI - all other systems neg except as noted... The patient complains of decreased hearing, dyspnea on exertion, muscle weakness, and difficulty walking.  The patient denies anorexia, fever, weight loss, weight gain, vision loss, hoarseness, chest pain, syncope, peripheral edema, prolonged cough, headaches, hemoptysis, abdominal pain, melena, hematochezia, severe indigestion/heartburn, hematuria, incontinence, suspicious skin lesions, transient blindness, depression, unusual weight change, abnormal bleeding, enlarged lymph nodes, and angioedema.     Objective:   Physical Exam     WD, WN, Chr ill appearing 82 y/o WF in NAD... GENERAL:  Alert & oriented; pleasant & cooperative... HEENT:  [place]/AT, EOM-full, EACs-clear, TMs-wnl, NOSE-clear, THROAT-clear & wnl. NECK:  Supple w/ fairROM; no JVD; normal carotid impulses w/o bruits; no thyromegaly or nodules palpated; no lymphadenopathy. CHEST:  Clear to P & A; without wheezes/ rales/ or  rhonchi heard... HEART:  Regular Rhythm; without murmurs/ rubs/ or gallops detected... ABDOMEN:  Soft & nontender; normal bowel sounds; no organomegaly or masses palpated... EXT:  mod arthritic changes, walks w/ cane, +venous insuffic & incr 1-2+ edema., scattered varicose veins... NEURO:  CN's intact; motor testing normal; no focal deficits... DERM:   mild intertrig rash under breast, & onychomycosis of toenails...  RADIOLOGY DATA:  Reviewed in the EPIC EMR & discussed w/ the patient...  LABORATORY DATA:  Reviewed in the EPIC EMR & discussed w/ the patient...   Assessment & Plan:    Hx episodes of SOB>> see above, she has been resistent to trying any of the meds & treatment suggestions that we have given to her (I know she would improve on Benzo rx)...  12/16> symptoms are better w/ Advair100-2spBid & Alprazolam 0.4mBid admin regularly at [facility]... 12/29/15> we checked the above labs, reminded to use Tylenol prn, no salt, elevate, etc; we decided to decr the Demadex to 1/2 tab Qam & ADD [id number] tab in the afternoon; she is reassured about her health & asked to f/u w/ DrGherghe for Endocrine re thyroid...  Hx Asthma> stable on Advair100 & Proair prn; hx anxiety component on Klonopin in past but she is not using! Now on Alpraz & improved.  HBP>  Controlled on Amlod5 + Demadex + Diamox, & K20/d; reminded to take meds regularly & no salt...  RBBB>  Aware & denies CP, palpit, ch in DOE, etc... she thinks that she is allergic to EKG electrodes; EKG 4./16 in ER showed NSR, rate72, PACs, LAD, RBBB  CHOL>  On diet alone & FLP looks reasonable;  We reviewed low chol, low fat diet...  DM>  BS= 101-135 & last A1c is 6.3;  on diet alone & we reviewed low carb no sweets etc...  Thyrotoxicosis>  Improved on Tapazole5 per DrGherghe, continue same...  GI> Indigestion, Divertics> she notes most bowel symptoms resolved off spicey foods;  UGI symptoms w/ dysphagia, food sticking, belch/gas/  etc have not resolved on Protonix40Bid and after GI consult w/ DrPerry; we reviewed her UGI series results and decided to proceed w/ MBS by Speech Path; transiently improved then sought 2nd opinion from DrJEdwards=> on Nexium40Bid...  UTI>  Klebsiella UTI resolved after Septra Rx... She has been eval by DrMacDiarmid.  DJD,  LBP, Spinal Stenosis>  Prev evals by Ortho, DrRamos, DrNudelman etc; improved after THR w/ better ambulation; c/o neuropathic discomfort in legs- she will f/u w/ Ramos for shots, using OTC "natural" meds...  Anxiety>  If she would take the Alpraz, I feel it would help...   Patient's Medications  New Prescriptions   No medications on file  Previous Medications   ACETAMINOPHEN (TYLENOL) 325 MG TABLET    Take 650 mg by mouth every 4 (four) hours as needed for mild pain.   ACETAZOLAMIDE (DIAMOX) 250 MG TABLET    TAKE (1) TABLET BY MOUTH EACH MORNING.   ALPRAZOLAM (XANAX) 0.25 MG TABLET    Take 1 tablet (0.25 mg total) by mouth at bedtime as needed for sleep.   AMLODIPINE (NORVASC) 5 MG TABLET    Take 1 tablet (5 mg total) by mouth daily.   CETIRIZINE (ZYRTEC) 10 MG TABLET    TAKE 1 TABLET BY MOUTH ONCE DAILY FOR ALLERGIES.   CHOLECALCIFEROL (VITAMIN D3) 2000 UNITS CAPSULE    1 capsule by mouth once daily   FLUTICASONE (FLONASE) 50 MCG/ACT NASAL SPRAY    SPRAY 2 SPRAYS INTO EACH NOSTRIL ONCE DAILY.   FLUTICASONE-SALMETEROL (ADVAIR DISKUS) 100-50 MCG/DOSE AEPB    Inhale 1 puff into the lungs 2 (two) times daily.   GUAIFENESIN (ROBITUSSIN) 100 MG/5ML SYRUP    Take 200 mg by mouth 3 (three) times daily as needed for cough.   IPRATROPIUM-ALBUTEROL (DUONEB) 0.5-2.5 (3) MG/3ML SOLN    Take 3 mLs by nebulization every 4 (four) hours as needed.   METHIMAZOLE (TAPAZOLE) 5 MG TABLET    Take 1 tablet (5 mg total) by mouth every other day.   MULTIPLE VITAMIN (DAILY-VITE) TABS    TAKE ONE TABLET BY MOUTH ONCE DAILY.   ONDANSETRON (ZOFRAN) 4 MG TABLET    Take 1 tablet (4 mg total) by mouth  every 6 (six) hours as needed for nausea or vomiting.   POLYETHYLENE GLYCOL (MIRALAX / GLYCOLAX) PACKET    Take 17 g by mouth daily as needed for mild constipation.    PROAIR HFA 108 (90 BASE) MCG/ACT INHALER    USE 1 TO 2 PUFFS EVERY SIX HOURS AS NEEDED.   QC LO-DOSE ASPIRIN 81 MG EC TABLET    TAKE ONE TABLET BY MOUTH ONCE DAILY.   RANITIDINE (ZANTAC) 150 MG TABLET    TAKE ONE TABLET BY MOUTH ONCE DAILY.   SERTRALINE (ZOLOFT) 50 MG TABLET    TAKE (1) TABLET BY MOUTH AT BEDTIME.   SPACER/AERO-HOLDING CHAMBERS (AEROCHAMBER PLUS WITH MASK) INHALER    Use as instructed   TORSEMIDE (DEMADEX) 20 MG TABLET    TAKE 1/2 TABLET BY MOUTH EVERY MORNING.  Modified Medications   No medications on file  Discontinued Medications   HYDROCODONE-HOMATROPINE (HYCODAN) 5-1.5 MG/5ML SYRUP    Take 5 mLs by mouth every 4 (four) hours as needed for cough.

## 2016-06-05 ENCOUNTER — Telehealth: Payer: Self-pay | Admitting: Pulmonary Disease

## 2016-06-05 NOTE — Telephone Encounter (Signed)
Spoke with the pharmacists. Calling to clarify the alprazolam RX. I advised per last OV 05/21/16: Alprazolam 0.25mg Bid admin regularly at [facility]...  He put this in pt chart. Nothing further needed

## 2016-06-11 ENCOUNTER — Other Ambulatory Visit: Payer: Self-pay | Admitting: Pulmonary Disease

## 2016-06-11 ENCOUNTER — Other Ambulatory Visit (INDEPENDENT_AMBULATORY_CARE_PROVIDER_SITE_OTHER): Payer: Medicare Other

## 2016-06-11 DIAGNOSIS — E0590 Thyrotoxicosis, unspecified without thyrotoxic crisis or storm: Secondary | ICD-10-CM | POA: Diagnosis not present

## 2016-06-11 LAB — T4, FREE: FREE T4: 0.61 ng/dL (ref 0.60–1.60)

## 2016-06-11 LAB — T3, FREE: T3 FREE: 2.5 pg/mL (ref 2.3–4.2)

## 2016-06-11 LAB — TSH: TSH: 2.45 u[IU]/mL (ref 0.35–4.50)

## 2016-06-13 ENCOUNTER — Other Ambulatory Visit: Payer: Self-pay | Admitting: Pulmonary Disease

## 2016-06-19 ENCOUNTER — Other Ambulatory Visit: Payer: Self-pay | Admitting: Pulmonary Disease

## 2016-06-28 ENCOUNTER — Ambulatory Visit: Payer: Medicare Other | Admitting: Pulmonary Disease

## 2016-07-02 ENCOUNTER — Telehealth: Payer: Self-pay | Admitting: Pulmonary Disease

## 2016-07-02 MED ORDER — ALPRAZOLAM 0.25 MG PO TABS: 0.2500 mg | ORAL_TABLET | Freq: Two times a day (BID) | ORAL | 5 refills | Status: DC

## 2016-07-02 NOTE — Telephone Encounter (Signed)
rx has been printed out and will be faxed back to [facility] with other papers for the pt.  Nothing further is needed.

## 2016-07-02 NOTE — Telephone Encounter (Signed)
[facility] is calling needing a refill on pt's Alprazolam. This will need to be faxed to them at [phone]. Pt's last OV with us was on 05/21/16. Has pending OV on 07/16/16. Last refill made by SN was on 08/15/15 #90.  SN - please advise on refill. Thanks.

## 2016-07-04 ENCOUNTER — Other Ambulatory Visit: Payer: Self-pay | Admitting: Internal Medicine

## 2016-07-12 ENCOUNTER — Other Ambulatory Visit: Payer: Self-pay | Admitting: Pulmonary Disease

## 2016-07-12 MED ORDER — ALPRAZOLAM 0.25 MG PO TABS: 0.2500 mg | ORAL_TABLET | Freq: Two times a day (BID) | ORAL | 5 refills | Status: DC

## 2016-07-12 NOTE — Telephone Encounter (Signed)
Called [facility] and they are aware of rx that is ready to be picked up.

## 2016-07-16 ENCOUNTER — Encounter: Payer: Self-pay | Admitting: Pulmonary Disease

## 2016-07-16 ENCOUNTER — Ambulatory Visit (INDEPENDENT_AMBULATORY_CARE_PROVIDER_SITE_OTHER): Payer: Medicare Other | Admitting: Pulmonary Disease

## 2016-07-16 ENCOUNTER — Other Ambulatory Visit (INDEPENDENT_AMBULATORY_CARE_PROVIDER_SITE_OTHER): Payer: Medicare Other

## 2016-07-16 VITALS — BP 128/70 | HR 60 | Ht 67.5 in | Wt 177.4 lb

## 2016-07-16 DIAGNOSIS — E559 Vitamin D deficiency, unspecified: Secondary | ICD-10-CM

## 2016-07-16 DIAGNOSIS — E0590 Thyrotoxicosis, unspecified without thyrotoxic crisis or storm: Secondary | ICD-10-CM

## 2016-07-16 DIAGNOSIS — M159 Polyosteoarthritis, unspecified: Secondary | ICD-10-CM

## 2016-07-16 DIAGNOSIS — I4510 Unspecified right bundle-branch block: Secondary | ICD-10-CM | POA: Diagnosis not present

## 2016-07-16 DIAGNOSIS — I10 Essential (primary) hypertension: Secondary | ICD-10-CM

## 2016-07-16 DIAGNOSIS — J410 Simple chronic bronchitis: Secondary | ICD-10-CM

## 2016-07-16 DIAGNOSIS — R609 Edema, unspecified: Secondary | ICD-10-CM | POA: Diagnosis not present

## 2016-07-16 DIAGNOSIS — R269 Unspecified abnormalities of gait and mobility: Secondary | ICD-10-CM

## 2016-07-16 DIAGNOSIS — I872 Venous insufficiency (chronic) (peripheral): Secondary | ICD-10-CM | POA: Diagnosis not present

## 2016-07-16 DIAGNOSIS — M150 Primary generalized (osteo)arthritis: Secondary | ICD-10-CM

## 2016-07-16 LAB — BASIC METABOLIC PANEL
BUN: 18 mg/dL (ref 6–23)
CHLORIDE: 103 meq/L (ref 96–112)
CO2: 34 meq/L — AB (ref 19–32)
CREATININE: 1.18 mg/dL (ref 0.40–1.20)
Calcium: 9.2 mg/dL (ref 8.4–10.5)
GFR: 45.59 mL/min — AB (ref >60.00)
GLUCOSE: 94 mg/dL (ref 70–99)
POTASSIUM: 4.3 meq/L (ref 3.5–5.1)
Sodium: 141 mEq/L (ref 135–145)

## 2016-07-16 LAB — VITAMIN D 25 HYDROXY (VIT D DEFICIENCY, FRACTURES): VITD: 55.88 ng/mL (ref 30.00–100.00)

## 2016-07-16 NOTE — Progress Notes (Signed)
Subjective:    Patient ID: [name], female    DOB: 12-21-1922, 82 y.o.   MRN: [id number]  HPI 82 y/o WF here for a follow up visit... she has multiple medical problems as noted below...  Followed for general medical purposes w/ hx chr obstructive asthma, severe episodic dyspnea from anxiety, HBP, RBBB, Hypercholesterolemia, borderline DM, DJD, LBP w/ sp stenosis, etc... ~  SEE PREV EPIC NOTES FOR THE OLDER DATA >>     CTAngio Chest 05/2009 showed no evid of PE, biapical pleuroparenchymal scarring otherw clear lungs, no adenopathy/ effusions/ etc...  CXR 3/15 showed norm heart size, clear lungs, elev of right hemidiaph, NAD...  LABS 3/15:  Chems- wnl;  CBC- wnl;  BNP=26...  LABS 5/15:  Chems- wnl x BS=120;  CBC- wnl;  BNP= 225    CXR 1/16 showed norm heart size, clear lungs, mild right diaph eventration- no change, Tspine DJD. DISH/ osteopenia; NAD...   2DEcho 1/16 showed norm LV size & function w/ EF=65-70%, AoV leaflets mildly thickened w/o AS, MV leaflets mod thickened w/ trivMR, mild RA dil, PAsys=49mHg...  LABS 1/16:  Chems- wnl w/ Cr=0.95;  BNP=66;  CBC- wnl w/ Hg=14.4..Marland KitchenMarland Kitchen CXR 4/16 in ER> norm heart size, atherosclerosis of Ao, no edema or consolidation in lungs, eventration of right hemidiaph- no change, DJD spine.  EKG 4/16 in ER> NSR, rate72, PACs, LAD, RBBB  LABS 4/16 in ER> Chems- wnl x K=3.0;  CBC- wnl;  Troponin=neg;  BNP=69   ~  July 21, 2015:  6wk ROV & add-on appt after 2 ER visits for dyspnea precipitated by anxiety> as prev noted- very difficult situation w/ 89y/o mother who insists on living alone, doing her own meds, etc but incapable of doing all that needs to be done & remembering her meds etc;  Daughter has done all she can do as there is alot of history in their relationship- every visit the daughter is the one who cries and mother is just angry;  They have a woman who stays with the pt at night but she won't allow help during the day;  Episodes  of dyspnea are ppt by panic attacks yet pt won't take benzos regularly & forgets to take them w/ these attacks- just calls the daugh & they freq wind up in the ER... Pt's son thinks her use of the rescue inhaler is an "upper" & causes problems, he wants her on oxygen instead & I reviewed Medicare guidelines- we will check ambulatory oxygen sat test today & sched ONO, otherw I offered to order Oxygen for them to self-pay but they decline... Pt flat out refused my rec for Klonopin Bid, she has Xanax 0.579mtabs which she uses sparingly, and given Ativan1m107mrom ER but says she doesn't have this med...      ER visit 07/15/15> c/o episode of SOB lasting 42m68mppt by sitting at home w/o AC [facility], Exam was neg w/ norm VS & O2sat=98% on RA, she had sl tender right chest wall, CXR showed norm heart size/ clear lungs/ NAD, EKG showed NSR/ rate88/ RBBB/ LAD, old infer scar/ no acute changes, LABS wnl & enz were neg... Symptoms resolved spont & she was disch home...      ER visit 06/12/15> another episode of SOB & no insight into cause but daugh indicates it was from anxiety/panic when she was home alone; Exam clear, VSS;  CXR was again wnl & Labs wnl as well; they gave  her Ativan to try but she hasn't used it; she was even given an aerochamber to use w/ her inhaler but she won't use that... EXAM reveals Afeb, VSS, O2sat=95% on RA;  HEENT- neg;  Chest- clear w/o w/r/r, sl tender on palp;  Heart- RR gr1/6 SEM no r/g;  Abd- soft, non-tender, neg;  Ext- VI, tr edema, no c/c;  Neuro- intact, walks w/ cane, anxious... We reviewed prob list, meds, xrays and labs> SHE DID NOT BRING MED BOTTLES OR LIST TO THE OV TODAY- reminded to do so for every visit!  CXR 7/24 & 07/15/15 showed norm heart size, tortuous Ao, clear lungs w/ mild elev of right hemidiaph, NAD.Marland KitchenMarland Kitchen  EKG 07/15/15 showed chronic changes- NSR/ rate88/ RBBB/ LAD/ old infer scar/ no acute abnormalities...  LABS 7-06/2015> Chems- wnl x BS=127-147;  BNP=55;   Troponins=neg;  CBC- wnl IMP/PLAN>>  I had another long talk w/ [name] & her daughter [name]; I have again rec that she use a low dose of the ALPRAZOLAM 0.47m tabs- 1/2 tab Tid regularly at breakfast, lunch, & dinner; she may also take an extra 1/2 tab prn anytime she feels anxious or panic setting in;  We have suggested similar plan many times in the past & for whatever reason she will not do it!  I have offered Psyche referral or to set her up w/ a counselor but this too is declined;  SEnid Derryis having a very hard time w/ her mother's attitude, lack of cooperation, mild dementia at age 82 etc...   ~  October 24, 2015:  328moOV & [name] appears to be stable- mult chr complaints but nothing new & doing reasonably well;  She has moved to [facility] [place] & has a siActuaryired by the family ([place]and this arrangement seems to be helping (pt likes RoGordon they get along but pt never misses an opportunity to "stick-it" to her daugh or demean her in some way)... We reviewed the following medical problems during today's office visit >>     Chr Obstructive Asthma> her dyspnea is from anxiety/panic not asthma; on Advair100Bid ([facility] requires regular Rx directions) & Proventil rescue prn; notes breathing back to baseline & prn Klonopin helps dyspnea...    HBP> on ASA81, Norvasc5, off Lasix20, K10-2/d; BP=140/76, tol meds well; denies CP, palpit, ch in SOB, tr edema...    CHOL> on diet alone, refuses meds, FLP 3/14 shows TChol 177, TG 50, HDL 60, LDL 107    DM> on diet alone, wt up sl at 179#, BS in epic=94-127, last A1c (2/13) was 6.3 & she knows to restrict carbs etc...    GI- Reflux, Divertics, constip> prev on Protonix40, now on Zantac150, Miralax; continue same meds.. [name]  DJD/ LBP> prev on Pred10, OTC analgesics prn & osteobiflex prn; had right THR 2011; known sp stenosis w/ prev ESI... We reviewed prob list, meds, xrays and labs> she had the 2016 Flu vaccine... IMP/PLAN>>  Stable overall,  continue current meds, rec to increase exercise & decr intake (work on wt reduction);  We plan ROV recheck w/ blood work in 3 mo...  ~  December 29, 2015:  72m23moV & Gwendolyn Sjogrend her daugh continue to be locked in battle regarding her care (resides at [facility] & has daytime sitter RosAltonom she loves); she persists w/ mult somatic complaints- SOB, irritable, throbbing in her legs, etc;  She saw TP 11/22/15 w/ CC insomnia & fatigue, and her Thyroid function had  not been checked for awhile=> labs showed TSH=0.05 and FreeT4=1.70 (prev TSH was 1.93 in 2014); she was referred to DrGherghe & seen 12/01/15> exam of neck was neg, TFT labs proved thyrotoxicosis, TSI was neg as was a Sed rate (14); they chose to try low dose Methimazole rx (62m/d) as opposed to scan/further testing; she has f/u appt w/ DrGherghe pending...     Breathing is stable on Advair100Bid, plus Flonase,Zyrtek, AlbutHFA prn; she continues to c/o SOB/ DOE...    BP is controlled on Amlod5, Demadex20, K10Bid; BP= 116/58 & she denies CP, palpit, ch in edema, etc...    GI- controlled on Zantac150, Miralax...    She remains on Xanax0.25Bid & Desyrel50Qhs prn sleep (she is out of Zoloft)... EXAM reveals Afeb, VSS, O2sat=95% on RA;  HEENT- neg;  Chest- clear w/o w/r/r;  Heart- RR gr1/6 SEM no r/g;  Abd- soft, non-tender, neg;  Ext- VI, tr edema, no c/c;  Neuro- intact, walks w/ cane, anxious...  LABS 11/2015 in Epic>  TSH=0.05 (confirmed), FreeT4=1.70, FreeT3=4.0, Thy Stim Ig was wnl;  Sed=14...  LABS 12/29/15> [name] insisted on recheck CBC & Iron level>  CBC- wnl w/ Hg=14.6, Fe=99 (31%sat);  Chems- abn w/ Na=130, TCO=39, Cr=1.2 IMP/PLAN>>  We checked the above labs, reminded to use Tylenol prn, no salt, elevate, etc; we decided to decr the Demadex to 1/2 tab Qam & ADD [id number] tab in the afternoon; she is reassured about her health & asked to f/u w/ DrGherghe for Endocrine...  ~  February 09, 2016:  6wk ROV & today is [name]'s 89st birthday!  DIredell notes that pt is better on the Tapazole- less anxious, mood swings diminished, sleeping better, etc; she notes that edema is increased (on Demadex20-1/2 & Diamox250)- reminded to elim sodium etc... We reviewed the following medical problems during today's office visit >>     Chr Obstructive Asthma> her dyspnea is from anxiety/panic not asthma; on Advair100Bid ([facility] requires regular Rx directions) & Proventil rescue prn; notes breathing back to baseline & Alp[raz0.25 helps dyspnea...    HBP> on ASA81, Norvasc5, Demadex20-1/2, Diamox250, K10-2/d; BP=140/70, tol meds well; denies CP, palpit, ch in SOB, but incr edema noted recently...    CHOL> on diet alone, refuses meds, last FLP 3/14 shows TChol 177, TG 50, HDL 60, LDL 107    DM> on diet alone, wt down to 166#, BS in epic=100-150, last A1c (2/13) was 6.3 & she knows to restrict carbs etc...    Thyrotoxicosis> on Tapazole5 per DrGherghe; f/u TFTs 01/10/16 showed TSH=2.39 and she is clinically improved...    GI- Reflux, Divertics, constip> prev on Protonix40, now on Zantac150, Miralax; continue same meds..[name]   DJD/ LBP> prev on Pred10, OTC analgesics prn & osteobiflex prn; had right THR 2011; known sp stenosis w/ prev ESI...    Anxiety, mild senile dementia, difficult interaction betw pt & daugh> on Xanax0.25Bid & Zoloft50 Qhs EXAM reveals Afeb, VSS, O2sat=93% on RA;  HEENT- neg, no thyroid nodule palp;  Chest- clear w/o w/r/r;  Heart- RR gr1/6 SEM no r/g;  Abd- soft, non-tender, neg;  Ext- VI, w/1+ edema, no c/c;  Neuro- intact, walks w/ cane, anxious...  CXR 01/11/16 showed borderline cardiomeg, elev right hemidiaph, clear lungs, DJD in Tspine...  LABS 12/2015>  Chems- ok w/ BS=152;  CBC- ok w/ Hg=14.1;  TSH=2.39 on Tapazole5... IMP/PLAN>>  We reviewed need for no salt, elev legs, wear support hose; decided to incr Demedex20 Qam & continue the Diamox250/d;  we plan ROV recheck in 644mo..  ~  May 21, 2016:  339moOV & post Hosp check>  Since she was  last here Gwendolyn Sjogrenas had several follow up visits w/ specialists [facility]    She saw DrGherghe 03/01/16>  Throtoxicosis on  Tapazole 64m61mod; they opted not to pursue diagnostic eval (adenoma vs Graves);  TSH=0.03 11/2015 7 improved to 2.39 on 01/10/16...    Seen by DrMayer- Podiatry 04/19/16> bilat big toenails, onychomycosis w/ debridement performed...     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    Above prob list reviewed... EXAM reveals Afeb, VSS, O2sat=95% on RA;  Wt= 175#;  HEENT- neg, no thyroid nodule palp;  Chest- clear w/o w/r/r;  Heart- RR gr1/6 SEM no r/g;  Abd- soft, non-tender, neg;  Ext- VI, w/1+ edema, no c/c;  Neuro- intact, walks w/ cane, anxious...  CXR 04/2016>  Aortic atherosclerosis, elev right hemidiaph, clear lungs, DDD...   CT Head 04/23/16>  Mucosal membrane thickening 7 sm amt fluid in left max sinus, otherw unremarkable- no ischemia or infarct...  CT Chest 04/23/16>  Atherosclerotic Ao & coronaries, no adenopathy, sm thyroid nodules noted no change from 2010, elev right hemidiaph & some basilar atx, DJD in spine w/ spurs & 40% T11 compression  Ba Esophagram 04/30/16>  Mod to severe esoph dysmotility noted, no mass or stricture...  LABS 04/2016>  Chems- ok x Cr=1.3-1.6, BS=100-140;  CBC- wnl;  Thyroid- ok on Tapazole... IMP/PLAN>>  MarHendel  improved & back to baseline; she knows to take her time & be careful eating;  Continue same meds; incr activity level; ROV in 4-6 weeks...   ~  July 16, 2016:  44mo61mo & [name] has settled into [facility], still has RosaHartfordping out part time, "I'm doing pretty good", says she's eating slowly/ small bites/ but whatever she wants, better overall on the Xanax0.25Bid;  From the pulm perspective she's using the Advair100Bid & NEB w/ albut vs Proair as needed but she hasn't needed eitherr ecently... Prev w/ HCO3=39 & Cr=1.2 so we adjusted her diuretics- from Demadex20 to 1/2 of that + Diamox250/d & improved...    DrGherghe rechecked her TFTs and they are euthyroid on Tapazole 64mg 4m...    DaughWhitsettests that we recheck her VitD level on her 2000u daily supplement. EXAM reveals Afeb, VSS, O2sat=96% on RA;  Wt= 177#;  HEENT- neg, no thyroid nodule palp;  Chest- clear w/o w/r/r;  Heart- RR gr1/6 SEM no r/g;  Abd- soft, non-tender, neg;  Ext- VI, w/ tr edema, no c/c;  Neuro- gait abn otherw neg.  LABS 07/16/16>  Chems- wnl w/ HCO3=34, Cr=1.18;  VitD=56... IMP/PLAN>>  Labs look great on current meds> continue Demadex20- 1/2 tab +  diamox250; continue VitD 2000u supplement; be suere to incr activity at [facility]! We plan recheck 82mo..           Problem List:       DYSPNEA (ICD-786.05) - long hx of chronic obstructive asthma treated w/ ADVAIR100Bid & PROAIR (she uses them Prn now)... she is a non-smoker w/ some reactive airways disease in the past & retired from [employer] years in 1991... she denies cough, sputum, hemoptysis, worsening dyspnea, wheezing, chest pains, snoring, daytime hypersomnolence, etc...  ~  baseline CXR w/o acute changes...  ~  PFT's 5/02 w/ FVC 2.07 (68%), FEV1=1.36 (57%), and FEV1/FVC ratio=66%, mid-flows 42%... ~  CT Angio 7/10 was neg- x biapical pleuroparenchymal scarring... ~  CXR 10/11 showed sl elev right hemidaiph, mild DJD sp, osteopenia, NAD..[name] ~  CXR 6/12  showed mild apical scarring, clear & NAD, DJD sp w/ osteophytes... ~  Intermittent dyspnea more related to anxiety & treated w/ KLONOPIN 0.'5mg'$  1/2 to 1 tab Bid==> improved. ~  CXR 4/13 showed normal heart size, clear lungs, DJD in TSpine... ~  CXR 2/14 showed normal heart size, clear lungs w/ sl peribronch thickening, DJD in spine, NAD..[name] ~  CXR 3/15 showed norm heart size, clear lungs, elev of right hemidiaph, NAD... ~  She is encouraged to take the Klonopin 0.'5mg'$  Bid regularly- consider taking 1/2 in AM, 1/2 in afternoon, one at bedtime... ~  1/16: she continues w/ mult somatic complaints and intermittent choking episodes etc; she refuses to take the Advair or Klonopin... ~  CXR 1/16 showed norm heart size, clear lungs, mild right diaph eventration- no change, Tspine DJD. DISH/ osteopenia; NAD... ~  3/16: she is stable on current meds, asked to incr her non-existent exercise program... ~  4/16: went to ER w/ SOB- nothing found x mild edema; given Lasix40, potassium was low, thought to be anxious; in office f/u her daugh confirms anxiety & pt not taking her meds regularly; asked to take meds every day & try the Klonopin... ~  7/16: same thing- went to ER w/ dyspnea, nothing found & given Ativan but she wouldn't take it... ~  8/16: another episode w/ ER eval and symptoms resolved spontaneously on there own... ~  9/16: pt is advised (again) to start regular dosing of Alpraz 0.'5mg'$  - 1/2 tab Tid w/ extra 1/2 tab as needed for nerves... ~  12/16: she remains on Advair100-2spBid & Alpraz0.'25mg'$ Bid=> improved... ~  3/17: dyspnea further improved w/ Tapazole'5mg'$ /d Rx for hyperthyroidism... ~  8/17:  She is stable on meds at [facility]- Advair100Bid, Albut prn, diuretics, Alprazolam, Tapazole, etc...  HYPERTENSION (ICD-401.9) - controlled on NORVASC '5mg'$  daily & HCTZ '25mg'$ tab daily...  ~  2/13:  BP 144/70 today> tol rx well & denies HA, visual changes, CP, palipit, dizziness, syncope, edema, etc... ~  4/13:   BP= 148/80 & she denies CP, palpit, edema; dyspnea improved w/ Klonopin. ~  6/13:  BP= 132/68 & as noted she has mult somatic complaints... ~  3/14:  on Norvasc10, HCTZ25; BP=136/60, tol meds well; denies CP, palpit, ch in SOB, edema, etc  ~  9/14:  BP controlled on Amlod5 & Hct25-1/2 daily; BP= 130/70 & she denies CP, palpit, dizzy, SOB, edema, etc. ~  3/15: on ASA81, Norvasc5, Lasix20- 1-2/d; BP=136/70, tol meds well; denies CP, palpit, ch in SOB, but notes persist edema in legs; Rec to ch Amlod5 to Losar50, low sodium, elev legs, Lasix40. ~  5/15: on Losar50, Lasix40;  BP= 138/60 &  she denies angina pain, palpit, ch in SOB, etc... ~  7/15: on Losar50, she stopped Lasix; BP= 138/78 and they want to go back on Amlod5 + HCT12.5 daily... ~  9/15: on Amlod5, Hct12.5; BP= 128/80 & she likes this combo better- denies CP, palpit, etc... ~  1/16: on Amlod5, Hct12.5 but ?what she is taking; BP= 130/80 & she has refused the ARBs... ~  3/16: on Amlod5, Hct12.5; BP= 142/80 & she has gained 9# w/ 1-2+ edema; discussed low sodium & change Hct to LASIX20 Qam... ~  5/16: on Amlod5, Lasix20, K20; BP=148/70, recent K=3.0 & daugh notes she's not taking meds regularly; asked to take meds everyday & incr K20Bid... ~  2/17: BP is controlled on Amlod5, Demadex20, K10Bid; BP= 116/58 & she denies CP, palpit, ch in edema, etc... ~  8/17: BP is controlled on Amlod5 + Demadex20-1/2 & diamox250/d;  BP= 128/70 7 rec to continue same...  RIGHT BUNDLE BRANCH BLOCK (ICD-426.4) - on ASA '81mg'$ /d... baseline EKG w/ RBBB and 2DEcho 5/02 showed mild asymmetric LVH w/ incr EF... ~  12/15: she had Cards eval by DrNishan> he rec ARB/diuretic but she refused to switch from her Amlod5/ Hct12.5 regimen; she refused EKG due to "allergy to the electrodes"... ~  2DEcho 1/16 showed norm LV size & function w/ EF=65-70%, AoV leaflets mildly thickened w/o AS, MV leaflets mod thickened w/ trivMR, mild RA dil, PAsys=57mHg... ~  EKG 4/16 showed NSR,  rate72, PACs, LAD, RBBB  VENOUS INSUFFIC & EDEMA >>  ~  3/15: she was switched off Amlod & onto Losar50, Lasix20=>40, plus no salt, elevation, support hose, etc...  ~  9[facility]15: [facility]; she has VI & 1+edema but stable, no acute changes... 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 ~  3/16: she remains on the Pred from Derm, now w/ incr edema & 9# wt gain, rec no salt & change Hct to LASIX20/d... ~  5/16: improved on Lasix20 w/ edema decr 7 wt down several lbs... ~  12/16: on ASA81, Norvasc5, off Lasix20, K10-2/d; BP=140/76, tol meds well; denies CP, palpit, ch in SOB, tr edema. ~  3/17: edema increased w/ decr in diuretics and [facility] to incr Demadex20/d + Diamox250/d and K10Bid...  HYPERLIPIDEMIA (ICD-272.4) - on diet alone... she forgets to come to visits FASTING for this blood work ~  [place] showed TChol 205, TG 71, HDL 49, LDL 130... ~  FLP 6/12 on diet alone showed TChol 218, TG 50, HDL 66, LDL 129 ~  FLP 2/13 on diet alone showed TChol 171, TG 43, HDL 66, LDL 97 ~  FLP 3/14 on diet alone showed TChol 177, TG 50, HDL 60, LDL 107 ~  She needs to ret FASTING for f/u FLP...  DIABETES MELLITUS, BORDERLINE (ICD-790.29) - on diet alone w/ prev BS's in the 100-160 range... ~  labs in 2008-9 showed BS= 101 to 108 ~  labs 1/10 showed BS= 106, A1c= 5.9 ~  Labs 6/12 showed BS= 93, A1c= 6.6..Marland KitchenMarland Kitchenrec diet, exercise... ~  Labs 2/13 showed BS= 92, A1c= 6.3 ~  3/14: on diet alone, wt stable ~174#, BS=97, last A1c (2/13) was 6.3 & she knows to restrict carbs etc. ~  Labs 3/15 showed BS= 95; and BS= 120 in [id number]..  ~  Labs 2016 showed BS= 101-135 on diet alone... ~  Labs in 2017 showed BS= 100-150  range on diet alone  THYROTOXICOSIS >> routine labs 11/2015 showed thyrotoxicosis & referred to Endocrine, DrGherghe- Rx w/ Tapazole'5mg'$ /d & she is clinically improved... ~  Labs 2/13 showed TSH= 2.86 ~   Labs 3/14 showed TSH=1.93 ~  Labs 1/17 showed TSH=0.03-0.09;  FreeT3=4.0;  FreeT4=1.70 => referred to DrGherghe, started on Tapazole5 ~  Labs 2/17 showed TSH=2.93;  FreeT3=2.5;  FreeT4=0.76 ~  She continues to f/u w/ drGherghe for thyroid...  INDIGESTION/ REFLUX SYMPTOMS >> see 10/12 note & PROTONIX '40mg'$ /d started, further eval if symptoms persist... ESOPHAGEAL DYSMOTILITY/ PRESBYESOPHAGUS >>  ~  4/13:  She notes some reflux symptoms and excess gas w/ belching; rec to take the Protonix daily & Simethacone vs Tums which she says helps her gas. ~  5/13:  She saw GI DrPerry w/ rec to take Prilosec for her indigestion... ~  5/15:  She presented w/ worsening indigestion, dysphagia, reflux & Prev30 was incr to Bid w/ GI f/u suggested for EGD... 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 ~  9/15:  Symptoms persist but she is not regurg or vomiting, and weight stable; they want to change to Nexium40Bid-OK, and rec proceed w/ MBS by speech path... ~  She continues to have intermit choking episodes and c/o phlegm in her throat; she has tried "everything" & encouraged to f/u w/ GI- DrPerry/ DrJEdwards... ~  She stopped the PPI rx in favor of Zantac150...  DIVERTICULOSIS OF COLON (ICD-562.10) - she takes SENAKOT-S, MIRALAX, Peppermint Tea, & sauerkraut Prn...last colonoscopy 9/02 by DrPerry was WNL...  PYELONEPHRITIS (ICD-590.80) - SEE 1/09 Hospitalization (reviewed)... ~  She saw DrMacDiarmid for her recurrent UTIs, chronic cystitis, urge & stress incont, nocturia; she is INTOL to [place]...  Hx of BREAST CYST (ICD-610.0)  DEGENERATIVE JOINT DISEASE (ICD-715.90) - s/p right hip hemiarthroplasty  11/09 by DrAplington w/ wound complic... then dx w/ loosening of the femoral shaft & had conversion to right THR by DrAlusio 10/11 & much improved... she uses CELEBREX '200mg'$  Prn (seldom takes this).  LOW BACK PAIN SYNDROME (ICD-724.2) & SPINAL STENOSIS (ICD-724.00) - severe LBP & spinal stenosis w/ evals by DrRamos & DrNudelman... s/p shots, considering poss surgery vs alternative therapies... she takes Celebrex, Osteobiflex, MVI, Vit D... ~  8/10: eval by DrAplington- diff leg lengths, lift placed in right shoe, then trial Lyrica'50mg'$ ... ~  12/11:  improved after hip revision surg (to THR) 10/11 w/ better ambulaton... ~  5/13:  DrRamos gave her another ESI for her leg pain related to sp stenosis... ~  3/14:  C/o neuropathic discomfort in legs- eval by Ramos & offered shots in her back; try Lyrica50 in the interim... ~  Persistent leg pain, on OTC "natural" meds she says; encouraged to f/u w/ DrRamos et al...  Hx of ANEMIA (ICD-285.9) - eval by GI in 2002 showed normal EGD and Colon... prob iron malabsorption problem Rx'd w/ Fe infusion... ~  labs 1/10 showed Hg= 14.6, MCV= 89, Fe= 94 ~  labs 12/11 showed Hg= 12.8, MCV= 90, Fe= 33... try Fe supplement + VitC... ~  Labs 6/12 showed Hg= 15.0 ~  Labs 2/13 showed Hg= 14.6 ~  Labs 2/14 showed Hg=  14.8 ~  Labs 3/15 showed Hg= 14.9 ~  Labs 1/16 showed Hg= 14.4 ~  Labs 4/16 showed Hg= 13.7 ~  Labs 2/17 showed Hg= 14.1  DERM:  rash Rx'd by dermatology- OLUX-E foam= clobetasol Foam 0.05%... 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 ~  5/16: she is still on Pred per Derm- currently '10mg'$ /d...    Past Surgical History:  Procedure Laterality Date  . CATARACT EXTRACTION    . conversion to right THR    . right hip hemiarthroplasty     total    Outpatient Encounter Prescriptions as of 07/16/2016  Medication Sig  . acetaminophen (TYLENOL) 325 MG tablet Take 650 mg by mouth every 4 (four)  hours as needed for mild pain.  [name] acetaZOLAMIDE (DIAMOX) 250 MG tablet TAKE (1) TABLET BY MOUTH EACH MORNING. (Patient taking differently: TAKE 250 MG BY MOUTH EACH MORNING.)  . ALPRAZolam (XANAX) 0.25 MG tablet Take 1 tablet (0.25 mg total) by mouth 2 (two) times daily.  [name] amLODipine (NORVASC) 5 MG tablet Take 1 tablet (5 mg total) by mouth daily.  . ASPIRIN LOW DOSE 81 MG EC tablet TAKE ONE TABLET BY MOUTH ONCE DAILY.  . cetirizine (ZYRTEC) 10 MG tablet TAKE 1 TABLET BY MOUTH ONCE DAILY FOR ALLERGIES. (Patient taking differently: TAKE 10 MG BY MOUTH ONCE DAILY FOR ALLERGIES.)  . Cholecalciferol (VITAMIN D3) 2000 units capsule Take 1 capsule by mouth once daily  . fluticasone (FLONASE) 50 MCG/ACT nasal spray SPRAY 2 SPRAYS INTO EACH NOSTRIL ONCE DAILY.  [name] Fluticasone-Salmeterol (ADVAIR DISKUS) 100-50 MCG/DOSE AEPB Inhale 1 puff into the lungs 2 (two) times daily.  [name] guaifenesin (ROBITUSSIN) 100 MG/5ML syrup Take 200 mg by mouth 3 (three) times daily as needed for cough.  [name] ipratropium-albuterol (DUONEB) 0.5-2.5 (3) MG/3ML SOLN Take 3 mLs by nebulization every 4 (four) hours as needed.  . methimazole (TAPAZOLE) 5 MG tablet TAKE 1 TABLET BY MOUTH EVERY OTHER DAY.  . Multiple Vitamin (DAILY-VITE) TABS TAKE ONE TABLET BY MOUTH ONCE DAILY.  [name] ondansetron (ZOFRAN) 4 MG tablet Take 1 tablet (4 mg total) by mouth every 6 (six) hours as needed for nausea or vomiting.  . polyethylene glycol (MIRALAX / GLYCOLAX) packet Take 17 g by mouth daily as needed for mild constipation.   . polyethylene glycol powder (GLYCOLAX/MIRALAX) powder MIX 1 CAPFUL (17G) IN 8 OUNCES OF JUICE/WATER AND DRINK ONCE DAILY ASNEEDED FOR CONSTIPATION.  [name] PROAIR HFA 108 (90 Base) MCG/ACT inhaler USE 1 TO 2 PUFFS EVERY SIX HOURS AS NEEDED.  . ranitidine (ZANTAC) 150 MG tablet TAKE ONE TABLET BY MOUTH ONCE DAILY.  [name] sertraline (ZOLOFT) 50 MG tablet TAKE (1) TABLET BY MOUTH AT BEDTIME. (Patient taking differently: Take 50 mg by mouth at  bedtime. )  . Spacer/Aero-Holding Chambers (AEROCHAMBER PLUS WITH MASK) inhaler Use as instructed  . torsemide (DEMADEX) 20 MG tablet TAKE 1/2 TABLET BY MOUTH EVERY MORNING. (Patient taking differently: TAKE 20 MG BY MOUTH EVERY MORNING.)   No facility-administered encounter medications on file as of 07/16/2016.     Allergies  Allergen Reactions  . Azithromycin Shortness Of Breath  . Ciprofloxacin Other (See Comments)     hallucinations  . Levofloxacin Other (See Comments)    Insomnia, indigestion, tingling sensation in legs  . Furosemide Rash    Bullous pemphigoid  . Latex Rash  . Other Rash    EKG leads caused a rash that required steroids  to clear    Current Medications, Allergies, Past Medical History, Past Surgical History, Family History, and Social History were reviewed in [employer] record.    Review of Systems         See HPI - all other systems neg except as noted... The patient complains of decreased hearing, dyspnea on exertion, muscle weakness, and difficulty walking.  The patient denies anorexia, fever, weight loss, weight gain, vision loss, hoarseness, chest pain, syncope, peripheral edema, prolonged cough, headaches, hemoptysis, abdominal pain, melena, hematochezia, severe indigestion/heartburn, hematuria, incontinence, suspicious skin lesions, transient blindness, depression, unusual weight change, abnormal bleeding, enlarged lymph nodes, and angioedema.     Objective:   Physical Exam     WD, WN, Chr ill appearing 82 y/o WF in NAD... GENERAL:  Alert & oriented; pleasant & cooperative... HEENT:  [place]/AT, EOM-full, EACs-clear, TMs-wnl, NOSE-clear, THROAT-clear & wnl. NECK:  Supple w/ fairROM; no JVD; normal carotid impulses w/o bruits; no thyromegaly or nodules palpated; no lymphadenopathy. CHEST:  Clear to P & A; without wheezes/ rales/ or rhonchi heard... HEART:  Regular Rhythm; without murmurs/ rubs/ or gallops detected... ABDOMEN:  Soft &  nontender; normal bowel sounds; no organomegaly or masses palpated... EXT:  mod arthritic changes, walks w/ cane, +venous insuffic & incr 1-2+ edema., scattered varicose veins... NEURO:  CN's intact; motor testing normal; no focal deficits... DERM:   mild intertrig rash under breast, & onychomycosis of toenails...  RADIOLOGY DATA:  Reviewed in the EPIC EMR & discussed w/ the patient...  LABORATORY DATA:  Reviewed in the EPIC EMR & discussed w/ the patient...   Assessment & Plan:    Hx episodes of SOB>> see above, she has been resistent to trying any of the meds & treatment suggestions that we have given to her (I know she would improve on Benzo rx)...  12/16> symptoms are better w/ Advair100-2spBid & Alprazolam 0.'25mg'$ Bid admin regularly at [facility]... 12/29/15> we checked the above labs, reminded to use Tylenol prn, no salt, elevate, etc; we decided to decr the Demadex to 1/2 tab Qam & ADD [id number] one tab in the afternoon; she is reassured about her health & asked to f/u w/ DrGherghe for Endocrine re thyroid... 07/16/16>   She is stable w/o recurrent dyspnea spells  Hx Asthma> stable on Advair100 & Proair prn; hx anxiety component on Klonopin in past but she is not using! Now on Alpraz & improved.  HBP>  Controlled on Amlod5 + Demadex + Diamox, & K20/d; reminded to take meds regularly & no salt...  RBBB>  Aware & denies CP, palpit, ch in DOE, etc... she thinks that she is allergic to EKG electrodes; EKG 4./16 in ER showed NSR, rate72, PACs, LAD, RBBB  CHOL>  On diet alone & FLP looks reasonable;  We reviewed low chol, low fat diet...  DM>  BS= 101-135 & last A1c is 6.3;  on diet alone & we reviewed low carb no sweets etc...  Thyrotoxicosis>  Improved on Tapazole5 per DrGherghe, continue same...  GI> Indigestion, Divertics> she notes most bowel symptoms resolved off spicey foods;  UGI symptoms w/ dysphagia, food sticking, belch/gas/ etc have not resolved on Protonix40Bid and after GI  consult w/ DrPerry; we reviewed her UGI series results and decided to proceed w/ MBS by Speech Path; transiently improved then sought 2nd opinion from DrJEdwards=> on Nexium40Bid...  UTI>  Klebsiella UTI resolved after Septra Rx... She has been eval by DrMacDiarmid.  DJD, LBP, Spinal Stenosis>  Prev evals by  Ortho, DrRamos, DrNudelman etc; improved after THR w/ better ambulation; c/o neuropathic discomfort in legs- she will f/u w/ Ramos for shots, using OTC "natural" meds...  Anxiety>  If she would take the Alpraz, I feel it would help...   Patient's Medications  New Prescriptions   No medications on file  Previous Medications   ACETAMINOPHEN (TYLENOL) 325 MG TABLET    Take 650 mg by mouth every 4 (four) hours as needed for mild pain.   ACETAZOLAMIDE (DIAMOX) 250 MG TABLET    TAKE (1) TABLET BY MOUTH EACH MORNING.   ALPRAZOLAM (XANAX) 0.25 MG TABLET    Take 1 tablet (0.25 mg total) by mouth 2 (two) times daily.   AMLODIPINE (NORVASC) 5 MG TABLET    Take 1 tablet (5 mg total) by mouth daily.   ASPIRIN LOW DOSE 81 MG EC TABLET    TAKE ONE TABLET BY MOUTH ONCE DAILY.   CETIRIZINE (ZYRTEC) 10 MG TABLET    TAKE 1 TABLET BY MOUTH ONCE DAILY FOR ALLERGIES.   CHOLECALCIFEROL (VITAMIN D3) 2000 UNITS CAPSULE    Take 1 capsule by mouth once daily   FLUTICASONE (FLONASE) 50 MCG/ACT NASAL SPRAY    SPRAY 2 SPRAYS INTO EACH NOSTRIL ONCE DAILY.   FLUTICASONE-SALMETEROL (ADVAIR DISKUS) 100-50 MCG/DOSE AEPB    Inhale 1 puff into the lungs 2 (two) times daily.   GUAIFENESIN (ROBITUSSIN) 100 MG/5ML SYRUP    Take 200 mg by mouth 3 (three) times daily as needed for cough.   IPRATROPIUM-ALBUTEROL (DUONEB) 0.5-2.5 (3) MG/3ML SOLN    Take 3 mLs by nebulization every 4 (four) hours as needed.   METHIMAZOLE (TAPAZOLE) 5 MG TABLET    TAKE 1 TABLET BY MOUTH EVERY OTHER DAY.   MULTIPLE VITAMIN (DAILY-VITE) TABS    TAKE ONE TABLET BY MOUTH ONCE DAILY.   ONDANSETRON (ZOFRAN) 4 MG TABLET    Take 1 tablet (4 mg total) by  mouth every 6 (six) hours as needed for nausea or vomiting.   POLYETHYLENE GLYCOL (MIRALAX / GLYCOLAX) PACKET    Take 17 g by mouth daily as needed for mild constipation.    POLYETHYLENE GLYCOL POWDER (GLYCOLAX/MIRALAX) POWDER    MIX 1 CAPFUL (17G) IN 8 OUNCES OF JUICE/WATER AND DRINK ONCE DAILY ASNEEDED FOR CONSTIPATION.   PROAIR HFA 108 (90 BASE) MCG/ACT INHALER    USE 1 TO 2 PUFFS EVERY SIX HOURS AS NEEDED.   RANITIDINE (ZANTAC) 150 MG TABLET    TAKE ONE TABLET BY MOUTH ONCE DAILY.   SERTRALINE (ZOLOFT) 50 MG TABLET    TAKE (1) TABLET BY MOUTH AT BEDTIME.   SPACER/AERO-HOLDING CHAMBERS (AEROCHAMBER PLUS WITH MASK) INHALER    Use as instructed   TORSEMIDE (DEMADEX) 20 MG TABLET    TAKE 1/2 TABLET BY MOUTH EVERY MORNING.  Modified Medications   No medications on file  Discontinued Medications   No medications on file

## 2016-07-16 NOTE — Patient Instructions (Signed)
Today we updated your med list in our EPIC system...    Continue your current medications the same...  Today we rechecked your metabolic panel 7 Vit d level...    We will contact you w/ the results when available...   Keep up the good work w/ diet & exercise!!!  Call for any questions...  Let's plan a follow up visit in 54mo, sooner if needed for problems.[name]..[name]

## 2016-07-26 ENCOUNTER — Ambulatory Visit: Payer: Medicare Other | Admitting: Podiatry

## 2016-07-30 ENCOUNTER — Telehealth: Payer: Self-pay | Admitting: Pulmonary Disease

## 2016-07-30 NOTE — Telephone Encounter (Signed)
called and gave [name] VO. Nothing further needed

## 2016-08-03 ENCOUNTER — Telehealth: Payer: Self-pay | Admitting: Pulmonary Disease

## 2016-08-03 ENCOUNTER — Other Ambulatory Visit: Payer: Self-pay | Admitting: Pulmonary Disease

## 2016-08-03 NOTE — Telephone Encounter (Signed)
Looks like Rx for xanax was sent in 07/12/16 #60 x 5 refills to [facility]. I called and spoke with Aggie Cosierheresa as sharon as left for the day. She is aware and nothing further needed

## 2016-08-03 NOTE — Telephone Encounter (Signed)
Refill request received from pharmacy for Xanax.   SN - Please advise if ok to refill. Thanks!   Allergies  Allergen Reactions  . Azithromycin Shortness Of Breath  . Ciprofloxacin Other (See Comments)     hallucinations  . Levofloxacin Other (See Comments)    Insomnia, indigestion, tingling sensation in legs  . Furosemide Rash    Bullous pemphigoid  . Latex Rash  . Other Rash    EKG leads caused a rash that required steroids to clear    Current Outpatient Prescriptions on File Prior to Visit  Medication Sig Dispense Refill  . acetaminophen (TYLENOL) 325 MG tablet Take 650 mg by mouth every 4 (four) hours as needed for mild pain.    [name]. acetaZOLAMIDE (DIAMOX) 250 MG tablet TAKE (1) TABLET BY MOUTH EACH MORNING. (Patient taking differently: TAKE 250 MG BY MOUTH EACH MORNING.) 30 tablet 5  . ALPRAZolam (XANAX) 0.25 MG tablet Take 1 tablet (0.25 mg total) by mouth 2 (two) times daily. 60 tablet 5  . amLODipine (NORVASC) 5 MG tablet Take 1 tablet (5 mg total) by mouth daily. 30 tablet 6  . ASPIRIN LOW DOSE 81 MG EC tablet TAKE ONE TABLET BY MOUTH ONCE DAILY. 30 tablet 0  . cetirizine (ZYRTEC) 10 MG tablet TAKE 1 TABLET BY MOUTH ONCE DAILY FOR ALLERGIES. (Patient taking differently: TAKE 10 MG BY MOUTH ONCE DAILY FOR ALLERGIES.) 30 tablet 5  . Cholecalciferol (VITAMIN D3) 2000 units capsule Take 1 capsule by mouth once daily 30 capsule 11  . fluticasone (FLONASE) 50 MCG/ACT nasal spray SPRAY 2 SPRAYS INTO EACH NOSTRIL ONCE DAILY. 16 g 5  . Fluticasone-Salmeterol (ADVAIR DISKUS) 100-50 MCG/DOSE AEPB Inhale 1 puff into the lungs 2 (two) times daily. 60 each 5  . guaifenesin (ROBITUSSIN) 100 MG/5ML syrup Take 200 mg by mouth 3 (three) times daily as needed for cough.    [name]. ipratropium-albuterol (DUONEB) 0.5-2.5 (3) MG/3ML SOLN Take 3 mLs by nebulization every 4 (four) hours as needed. 360 mL 0  . methimazole (TAPAZOLE) 5 MG tablet TAKE 1 TABLET BY MOUTH EVERY OTHER DAY. 15 tablet 1  . Multiple  Vitamin (DAILY-VITE) TABS TAKE ONE TABLET BY MOUTH ONCE DAILY. 30 tablet 11  . ondansetron (ZOFRAN) 4 MG tablet Take 1 tablet (4 mg total) by mouth every 6 (six) hours as needed for nausea or vomiting. 15 tablet 0  . polyethylene glycol (MIRALAX / GLYCOLAX) packet Take 17 g by mouth daily as needed for mild constipation.     . polyethylene glycol powder (GLYCOLAX/MIRALAX) powder MIX 1 CAPFUL (17G) IN 8 OUNCES OF JUICE/WATER AND DRINK ONCE DAILY ASNEEDED FOR CONSTIPATION. 527 g 0  . PROAIR HFA 108 (90 Base) MCG/ACT inhaler USE 1 TO 2 PUFFS EVERY SIX HOURS AS NEEDED. 8.5 g 0  . ranitidine (ZANTAC) 150 MG tablet TAKE ONE TABLET BY MOUTH ONCE DAILY. 30 tablet 5  . sertraline (ZOLOFT) 50 MG tablet TAKE (1) TABLET BY MOUTH AT BEDTIME. (Patient taking differently: Take 50 mg by mouth at bedtime. ) 30 tablet 5  . Spacer/Aero-Holding Chambers (AEROCHAMBER PLUS WITH MASK) inhaler Use as instructed 1 each 2  . torsemide (DEMADEX) 20 MG tablet TAKE 1/2 TABLET BY MOUTH EVERY MORNING. (Patient taking differently: TAKE 20 MG BY MOUTH EVERY MORNING.) 15 tablet 11   No current facility-administered medications on file prior to visit.

## 2016-08-06 NOTE — Telephone Encounter (Signed)
[name], CMA      08/03/16 11:17 AM  Note    Looks like Rx for xanax was sent in 07/12/16 #60 x 5 refills to [facility]. I called and spoke with Aggie Cosierheresa as sharon as left for the day. She is aware and nothing further needed

## 2016-08-27 ENCOUNTER — Ambulatory Visit: Payer: Medicare Other | Admitting: Internal Medicine

## 2016-08-28 ENCOUNTER — Ambulatory Visit (INDEPENDENT_AMBULATORY_CARE_PROVIDER_SITE_OTHER): Payer: Medicare Other | Admitting: Internal Medicine

## 2016-08-28 ENCOUNTER — Encounter: Payer: Self-pay | Admitting: Internal Medicine

## 2016-08-28 ENCOUNTER — Ambulatory Visit: Payer: Medicare Other | Admitting: Internal Medicine

## 2016-08-28 VITALS — BP 124/78 | HR 72 | Wt 179.0 lb

## 2016-08-28 DIAGNOSIS — E0590 Thyrotoxicosis, unspecified without thyrotoxic crisis or storm: Secondary | ICD-10-CM

## 2016-08-28 LAB — TSH: TSH: 3.11 u[IU]/mL (ref 0.35–4.50)

## 2016-08-28 LAB — T4, FREE: FREE T4: 0.6 ng/dL (ref 0.60–1.60)

## 2016-08-28 LAB — T3, FREE: T3, Free: 2.3 pg/mL (ref 2.3–4.2)

## 2016-08-28 NOTE — Patient Instructions (Signed)
Please stop at the lab.  Please continue Methimazole 2.5 mg daily.  Please return in 6 months.

## 2016-08-28 NOTE — Progress Notes (Signed)
Patient ID: [name], female   DOB: 1923-04-28, 82 y.o.   MRN: [id number]   HPI  [name] is a 82 y.o.-year-old female,  returning for follow-up for thyrotoxicosis. Last visit 6 months ago. She is here with her daughter [name]) who is also my pt.   Reviewed and addended history: Pt developed anxiety attacks/panic attacks/sundowning >> in ED 6x since last summer per daughter's report. She also gets SOB with any activity, increased appetite and has insomnia. She has nocturia and leg swelling.  TFTs returned abnormal when checked in 11/2015, indicating thyrotoxicosis.  Due to age and also due to the fact that the TFTs appeared to be improving when I saw her last, we decided to start a low dose methimazole, but we did not pursue further investigation, assuming either mild Graves ds or toxic adenoma, both amenable to MMI.  At last visit, we started methimazole 5 mg once daily. For a week she took it 3 times a day, due to a mixup, however, afterwards, she started to take it daily. TFTs normalized at last check one half months ago.  After we started methimazole, patient developed a better attitude and less anxiety, per daughter's report. She is sleeping better, also.  She is living in [facility] (ALF).  I reviewed pt's thyroid tests: Lab Results  Component Value Date   TSH 2.45 06/11/2016   TSH 3.87 04/12/2016   TSH 7.97 (H) 03/01/2016   TSH 2.39 01/10/2016   TSH 0.09 (L) 12/01/2015   TSH 0.03 (L) 11/23/2015   TSH 0.05 Repeated and verified X2. (L) 11/22/2015   TSH 1.93 02/10/2013   TSH 2.86 01/17/2012   TSH 1.53 05/14/2011   FREET4 0.61 06/11/2016   FREET4 0.57 (L) 04/12/2016   FREET4 0.62 03/01/2016   FREET4 0.76 01/10/2016   FREET4 1.47 12/01/2015   FREET4 1.70 (H) 11/23/2015    Component     Latest Ref Rng 12/01/2015  TSI     <140 % baseline 88  Sed Rate     0 - 22 mm/hr 14   Pt denies feeling nodules in neck, hoarseness, dysphagia/odynophagia,  SOB with lying down; she c/o: - + fatigue - + Weight gain - + tremors, but better - no excessive sweating/heat intolerance - no palpitations - no hyperdefecation - no hair loss  Pt does not have a FH of thyroid ds. No FH of thyroid cancer. No h/o radiation tx to head or neck.  No seaweed or kelp, no recent contrast studies. No steroid use. No herbal supplements. No Biotin use.  I reviewed her chart and she also has a history of bullous pemphigoid >> on Prednisone >> now off.  ROS: Constitutional: see HPI, + nocturia Eyes: no blurry vision, no xerophthalmia ENT: no sore throat, no nodules palpated in throat, no dysphagia/odynophagia, no hoarseness Cardiovascular: no CP/+ SOB/no palpitations/+ leg swelling Respiratory: no cough/+ SOB Gastrointestinal: no N/V/D/C/heartburn Musculoskeletal: + muscle aches/+ joint aches Skin: No rashes Neurological: Less tremors/no numbness/tingling/dizziness  I reviewed pt's medications, allergies, PMH, social hx, family hx, and changes were documented in the history of present illness. Otherwise, unchanged from my initial visit note.  Past Medical History:  Diagnosis Date  . Anemia, unspecified   . Diverticulosis of colon (without mention of hemorrhage)   . DJD (degenerative joint disease)   . GERD (gastroesophageal reflux disease)   . Lumbago   . Osteoarthrosis, unspecified whether generalized or localized, unspecified site   . Other abnormal glucose   .  Other and unspecified hyperlipidemia   . Pyelonephritis   . Pyelonephritis, unspecified   . Right bundle branch block   . Shortness of breath   . Solitary cyst of breast   . Spinal stenosis, unspecified region other than cervical   . Unspecified essential hypertension   . UTI (lower urinary tract infection)    Past Surgical History:  Procedure Laterality Date  . CATARACT EXTRACTION    . conversion to right THR    . right hip hemiarthroplasty     total   Social History   Social  History  . Marital Status: Divorced    Spouse Name: N/A  . Number of Children: 3- 1 deceased   Occupational History  . retired    Social History Main Topics  . Smoking status: Never Smoker   . Smokeless tobacco: Never Used  . Alcohol Use: No  . Drug Use: No   Pt lives in [facility].  Current Outpatient Prescriptions on File Prior to Visit  Medication Sig Dispense Refill  . acetaminophen (TYLENOL) 325 MG tablet Take 650 mg by mouth every 4 (four) hours as needed for mild pain.    [name]. acetaZOLAMIDE (DIAMOX) 250 MG tablet TAKE (1) TABLET BY MOUTH EACH MORNING. (Patient taking differently: TAKE 250 MG BY MOUTH EACH MORNING.) 30 tablet 5  . ALPRAZolam (XANAX) 0.25 MG tablet Take 1 tablet (0.25 mg total) by mouth 2 (two) times daily. 60 tablet 5  . amLODipine (NORVASC) 5 MG tablet Take 1 tablet (5 mg total) by mouth daily. 30 tablet 6  . ASPIRIN LOW DOSE 81 MG EC tablet TAKE ONE TABLET BY MOUTH ONCE DAILY. 30 tablet 0  . cetirizine (ZYRTEC) 10 MG tablet TAKE 1 TABLET BY MOUTH ONCE DAILY FOR ALLERGIES. (Patient taking differently: TAKE 10 MG BY MOUTH ONCE DAILY FOR ALLERGIES.) 30 tablet 5  . Cholecalciferol (VITAMIN D3) 2000 units capsule Take 1 capsule by mouth once daily 30 capsule 11  . fluticasone (FLONASE) 50 MCG/ACT nasal spray SPRAY 2 SPRAYS INTO EACH NOSTRIL ONCE DAILY. 16 g 5  . Fluticasone-Salmeterol (ADVAIR DISKUS) 100-50 MCG/DOSE AEPB Inhale 1 puff into the lungs 2 (two) times daily. 60 each 5  . guaifenesin (ROBITUSSIN) 100 MG/5ML syrup Take 200 mg by mouth 3 (three) times daily as needed for cough.    [name]. ipratropium-albuterol (DUONEB) 0.5-2.5 (3) MG/3ML SOLN Take 3 mLs by nebulization every 4 (four) hours as needed. 360 mL 0  . methimazole (TAPAZOLE) 5 MG tablet TAKE 1 TABLET BY MOUTH EVERY OTHER DAY. 15 tablet 1  . Multiple Vitamin (DAILY-VITE) TABS TAKE ONE TABLET BY MOUTH ONCE DAILY. 30 tablet 11  . ondansetron (ZOFRAN) 4 MG tablet Take 1 tablet (4 mg total) by mouth every 6  (six) hours as needed for nausea or vomiting. 15 tablet 0  . polyethylene glycol (MIRALAX / GLYCOLAX) packet Take 17 g by mouth daily as needed for mild constipation.     . polyethylene glycol powder (GLYCOLAX/MIRALAX) powder MIX 1 CAPFUL (17G) IN 8 OUNCES OF JUICE/WATER AND DRINK ONCE DAILY ASNEEDED FOR CONSTIPATION. 527 g 0  . PROAIR HFA 108 (90 Base) MCG/ACT inhaler USE 1 TO 2 PUFFS EVERY SIX HOURS AS NEEDED. 8.5 g 0  . ranitidine (ZANTAC) 150 MG tablet TAKE ONE TABLET BY MOUTH ONCE DAILY. 30 tablet 5  . sertraline (ZOLOFT) 50 MG tablet TAKE (1) TABLET BY MOUTH AT BEDTIME. (Patient taking differently: Take 50 mg by mouth at bedtime. ) 30 tablet 5  .  Spacer/Aero-Holding Chambers (AEROCHAMBER PLUS WITH MASK) inhaler Use as instructed 1 each 2  . torsemide (DEMADEX) 20 MG tablet TAKE 1/2 TABLET BY MOUTH EVERY MORNING. (Patient taking differently: TAKE 20 MG BY MOUTH EVERY MORNING.) 15 tablet 11   No current facility-administered medications on file prior to visit.    Allergies  Allergen Reactions  . Azithromycin Shortness Of Breath  . Ciprofloxacin Other (See Comments)     hallucinations  . Levofloxacin Other (See Comments)    Insomnia, indigestion, tingling sensation in legs  . Furosemide Rash    Bullous pemphigoid  . Latex Rash  . Other Rash    EKG leads caused a rash that required steroids to clear   Family History  Problem Relation Age of Onset  . Cancer Brother   . Cancer Brother   . Heart failure Father    PE: BP 124/78 (BP Location: Left Arm, Patient Position: Sitting)   Pulse 72   Wt 179 lb (81.2 kg)   SpO2 93%   BMI 27.62 kg/m  Body mass index is 27.62 kg/m. Wt Readings from Last 3 Encounters:  08/28/16 179 lb (81.2 kg)  07/16/16 177 lb 6 oz (80.5 kg)  05/21/16 175 lb 3.2 oz (79.5 kg)   Constitutional: overweight, in NAD Eyes: PERRLA, EOMI, no exophthalmos, no lid lag, no stare; + hyperemic conjunctiva R eye ENT: moist mucous membranes, no thyromegaly, no  cervical lymphadenopathy Cardiovascular: RRR, + 1/6 SEM, no RG, + B LE pitting edema +2 Respiratory: CTA B Gastrointestinal: abdomen soft, NT, ND, BS+ Musculoskeletal: no deformities, strength intact in all 4 Skin: moist, warm,+ stasis dermatitis B legs  Neurological: Mild tremor with outstretched hands, DTR normal in all 4  ASSESSMENT: 1. Thyrotoxicosis  PLAN:  1. Patient with low TSH + high fT4 in 11/2015, with thyrotoxic sxs: anxiety, Irritability, tremors, poor sleep. We started her on methimazole, and we decreased the dose down to 2.5 mg daily with good improvement in anxiety, irritability, also better sleep. - Possible causes of thyrotoxicosis are:  Thyroiditis Graves ds   toxic multinodular goiter/ toxic adenoma Due to age and the fact that her TFTs were improving, we skipped further investigation for her thyrotoxicosis, assuming either mild Graves' disease (TSI antibodies were not elevated) or toxic adenoma.  -  we will check a TSH, fT3 and fT4 today and adjust methimazole dose accordingly - I do not feel that we need to add beta blockers at this time, since she is not tachycardic. - I advised her to join my chart to communicate easier - RTC in 6 months, but likely sooner for repeat labs  Component     Latest Ref Rng & Units 08/28/2016  TSH     0.35 - 4.50 uIU/mL 3.11  Triiodothyronine,Free,Serum     2.3 - 4.2 pg/mL 2.3  T4,Free(Direct)     0.60 - 1.60 ng/dL 4.09   TSH is slightly higher while the free T4 and free T3 are at the lower limit of normal. We'll try to stop the methimazole and recheck labs in 5-6 weeks.  Will need to call Dtr with results: [phone] [name]).  Carlus Pavlov, MD PhD [facility]

## 2016-10-02 ENCOUNTER — Other Ambulatory Visit (INDEPENDENT_AMBULATORY_CARE_PROVIDER_SITE_OTHER): Payer: Medicare Other

## 2016-10-02 DIAGNOSIS — E0590 Thyrotoxicosis, unspecified without thyrotoxic crisis or storm: Secondary | ICD-10-CM

## 2016-10-02 LAB — TSH: TSH: 1.78 u[IU]/mL (ref 0.35–4.50)

## 2016-10-02 LAB — T3, FREE: T3 FREE: 2.6 pg/mL (ref 2.3–4.2)

## 2016-10-02 LAB — T4, FREE: FREE T4: 0.7 ng/dL (ref 0.60–1.60)

## 2016-10-04 ENCOUNTER — Other Ambulatory Visit: Payer: Self-pay | Admitting: Internal Medicine

## 2016-10-04 ENCOUNTER — Other Ambulatory Visit: Payer: Self-pay

## 2016-10-04 DIAGNOSIS — E0590 Thyrotoxicosis, unspecified without thyrotoxic crisis or storm: Secondary | ICD-10-CM

## 2016-10-04 MED ORDER — METHIMAZOLE 5 MG PO TABS: ORAL_TABLET | ORAL | 0 refills | Status: DC

## 2016-10-15 ENCOUNTER — Ambulatory Visit: Payer: Medicare Other | Admitting: Pulmonary Disease

## 2016-10-16 ENCOUNTER — Ambulatory Visit: Payer: Medicare Other | Admitting: Pulmonary Disease

## 2016-10-22 ENCOUNTER — Telehealth: Payer: Self-pay | Admitting: Internal Medicine

## 2016-10-24 ENCOUNTER — Other Ambulatory Visit: Payer: Medicare Other

## 2016-10-24 DIAGNOSIS — E0590 Thyrotoxicosis, unspecified without thyrotoxic crisis or storm: Secondary | ICD-10-CM

## 2016-10-24 LAB — T3, FREE: T3 FREE: 2.5 pg/mL (ref 2.3–4.2)

## 2016-10-24 LAB — T4, FREE: FREE T4: 0.66 ng/dL (ref 0.60–1.60)

## 2016-10-24 LAB — TSH: TSH: 3.1 u[IU]/mL (ref 0.35–4.50)

## 2016-10-25 ENCOUNTER — Telehealth: Payer: Self-pay | Admitting: *Deleted

## 2016-10-25 DIAGNOSIS — E0590 Thyrotoxicosis, unspecified without thyrotoxic crisis or storm: Secondary | ICD-10-CM

## 2016-10-25 NOTE — Telephone Encounter (Signed)
return for another set of labs: TSH, free T4, free T3 in 3 months

## 2016-11-14 NOTE — Telephone Encounter (Signed)
Labs 3 weeks ago were normal. Let's wait another 1.5 weeks and have her back for labs I will let them know at that time if we need to decrease methimazole.

## 2016-11-14 NOTE — Telephone Encounter (Signed)
LAB Appt. 1/4/ @ 9:15

## 2016-11-14 NOTE — Telephone Encounter (Signed)
Patient daughter would like to know when patient need to come in to get labs drawn?  Could medication methimazole (TAPAZOLE) 5 MG tablet could be causing her to constantly be sleepling, she is sleeping to much, most of the day and all night.   Please advise

## 2016-11-15 ENCOUNTER — Other Ambulatory Visit: Payer: Medicare Other

## 2016-11-22 ENCOUNTER — Other Ambulatory Visit: Payer: Medicare Other

## 2016-11-26 ENCOUNTER — Other Ambulatory Visit: Payer: Medicare Other

## 2016-12-04 ENCOUNTER — Ambulatory Visit (INDEPENDENT_AMBULATORY_CARE_PROVIDER_SITE_OTHER): Payer: Medicare Other | Admitting: Pulmonary Disease

## 2016-12-04 ENCOUNTER — Other Ambulatory Visit (INDEPENDENT_AMBULATORY_CARE_PROVIDER_SITE_OTHER): Payer: Medicare Other

## 2016-12-04 VITALS — BP 120/62 | HR 74 | Temp 97.4°F | Ht 67.5 in | Wt 189.0 lb

## 2016-12-04 DIAGNOSIS — Z23 Encounter for immunization: Secondary | ICD-10-CM | POA: Diagnosis not present

## 2016-12-04 DIAGNOSIS — I4510 Unspecified right bundle-branch block: Secondary | ICD-10-CM | POA: Diagnosis not present

## 2016-12-04 DIAGNOSIS — I872 Venous insufficiency (chronic) (peripheral): Secondary | ICD-10-CM | POA: Diagnosis not present

## 2016-12-04 DIAGNOSIS — E0590 Thyrotoxicosis, unspecified without thyrotoxic crisis or storm: Secondary | ICD-10-CM | POA: Diagnosis not present

## 2016-12-04 DIAGNOSIS — I10 Essential (primary) hypertension: Secondary | ICD-10-CM

## 2016-12-04 DIAGNOSIS — R269 Unspecified abnormalities of gait and mobility: Secondary | ICD-10-CM | POA: Diagnosis not present

## 2016-12-04 DIAGNOSIS — R601 Generalized edema: Secondary | ICD-10-CM

## 2016-12-04 DIAGNOSIS — M150 Primary generalized (osteo)arthritis: Secondary | ICD-10-CM | POA: Diagnosis not present

## 2016-12-04 DIAGNOSIS — M159 Polyosteoarthritis, unspecified: Secondary | ICD-10-CM

## 2016-12-04 LAB — BASIC METABOLIC PANEL
BUN: 24 mg/dL — AB (ref 6–23)
CHLORIDE: 102 meq/L (ref 96–112)
CO2: 34 meq/L — AB (ref 19–32)
CREATININE: 1.27 mg/dL — AB (ref 0.40–1.20)
Calcium: 9.6 mg/dL (ref 8.4–10.5)
GFR: 41.85 mL/min — ABNORMAL LOW (ref >60.00)
GLUCOSE: 85 mg/dL (ref 70–99)
Potassium: 4 mEq/L (ref 3.5–5.1)
Sodium: 142 mEq/L (ref 135–145)

## 2016-12-04 NOTE — Progress Notes (Signed)
Subjective:    Patient ID: [name], female    DOB: 12-21-1922, 83 y.o.   MRN: [id number]  HPI 83 y/o WF here for a follow up visit... she has multiple medical problems as noted below...  Followed for general medical purposes w/ hx chr obstructive asthma, severe episodic dyspnea from anxiety, HBP, RBBB, Hypercholesterolemia, borderline DM, DJD, LBP w/ sp stenosis, etc... ~  SEE PREV EPIC NOTES FOR THE OLDER DATA >>     CTAngio Chest 05/2009 showed no evid of PE, biapical pleuroparenchymal scarring otherw clear lungs, no adenopathy/ effusions/ etc...  CXR 3/15 showed norm heart size, clear lungs, elev of right hemidiaph, NAD...  LABS 3/15:  Chems- wnl;  CBC- wnl;  BNP=26...  LABS 5/15:  Chems- wnl x BS=120;  CBC- wnl;  BNP= 225    CXR 1/16 showed norm heart size, clear lungs, mild right diaph eventration- no change, Tspine DJD. DISH/ osteopenia; NAD...   2DEcho 1/16 showed norm LV size & function w/ EF=65-70%, AoV leaflets mildly thickened w/o AS, MV leaflets mod thickened w/ trivMR, mild RA dil, PAsys=49mHg...  LABS 1/16:  Chems- wnl w/ Cr=0.95;  BNP=66;  CBC- wnl w/ Hg=14.4..Marland KitchenMarland Kitchen CXR 4/16 in ER> norm heart size, atherosclerosis of Ao, no edema or consolidation in lungs, eventration of right hemidiaph- no change, DJD spine.  EKG 4/16 in ER> NSR, rate72, PACs, LAD, RBBB  LABS 4/16 in ER> Chems- wnl x K=3.0;  CBC- wnl;  Troponin=neg;  BNP=69   ~  July 21, 2015:  6wk ROV & add-on appt after 2 ER visits for dyspnea precipitated by anxiety> as prev noted- very difficult situation w/ 1166y/o mother who insists on living alone, doing her own meds, etc but incapable of doing all that needs to be done & remembering her meds etc;  Daughter has done all she can do as there is alot of history in their relationship- every visit the daughter is the one who cries and mother is just angry;  They have a woman who stays with the pt at night but she won't allow help during the day;  Episodes  of dyspnea are ppt by panic attacks yet pt won't take benzos regularly & forgets to take them w/ these attacks- just calls the daugh & they freq wind up in the ER... Pt's son thinks her use of the rescue inhaler is an "upper" & causes problems, he wants her on oxygen instead & I reviewed Medicare guidelines- we will check ambulatory oxygen sat test today & sched ONO, otherw I offered to order Oxygen for them to self-pay but they decline... Pt flat out refused my rec for Klonopin Bid, she has Xanax 0.579mtabs which she uses sparingly, and given Ativan1m107mrom ER but says she doesn't have this med...      ER visit 07/15/15> c/o episode of SOB lasting 42m68mppt by sitting at home w/o AC [facility], Exam was neg w/ norm VS & O2sat=98% on RA, she had sl tender right chest wall, CXR showed norm heart size/ clear lungs/ NAD, EKG showed NSR/ rate88/ RBBB/ LAD, old infer scar/ no acute changes, LABS wnl & enz were neg... Symptoms resolved spont & she was disch home...      ER visit 06/12/15> another episode of SOB & no insight into cause but daugh indicates it was from anxiety/panic when she was home alone; Exam clear, VSS;  CXR was again wnl & Labs wnl as well; they gave  her Ativan to try but she hasn't used it; she was even given an aerochamber to use w/ her inhaler but she won't use that... EXAM reveals Afeb, VSS, O2sat=95% on RA;  HEENT- neg;  Chest- clear w/o w/r/r, sl tender on palp;  Heart- RR gr1/6 SEM no r/g;  Abd- soft, non-tender, neg;  Ext- VI, tr edema, no c/c;  Neuro- intact, walks w/ cane, anxious... We reviewed prob list, meds, xrays and labs> SHE DID NOT BRING MED BOTTLES OR LIST TO THE OV TODAY- reminded to do so for every visit!  CXR 7/24 & 07/15/15 showed norm heart size, tortuous Ao, clear lungs w/ mild elev of right hemidiaph, NAD.Marland KitchenMarland Kitchen  EKG 07/15/15 showed chronic changes- NSR/ rate88/ RBBB/ LAD/ old infer scar/ no acute abnormalities...  LABS 7-06/2015> Chems- wnl x BS=127-147;  BNP=55;   Troponins=neg;  CBC- wnl IMP/PLAN>>  I had another long talk w/ Gwendolyn Schmid & her daughter [name]; I have again rec that she use a low dose of the ALPRAZOLAM 0.47m tabs- 1/2 tab Tid regularly at breakfast, lunch, & dinner; she may also take an extra 1/2 tab prn anytime she feels anxious or panic setting in;  We have suggested similar plan many times in the past & for whatever reason she will not do it!  I have offered Psyche referral or to set her up w/ a counselor but this too is declined;  SEnid Derryis having a very hard time w/ her mother's attitude, lack of cooperation, mild dementia at age 83 etc...   ~  October 24, 2015:  328moOV & [name] appears to be stable- mult chr complaints but nothing new & doing reasonably well;  She has moved to [facility]/[place] & has a siActuaryired by the family ([place]and this arrangement seems to be helping (pt likes RoGordon they get along but pt never misses an opportunity to "stick-it" to her daugh or demean her in some way)... We reviewed the following medical problems during today's office visit >>     Chr Obstructive Asthma> her dyspnea is from anxiety/panic not asthma; on Advair100Bid ([facility] requires regular Rx directions) & Proventil rescue prn; notes breathing back to baseline & prn Klonopin helps dyspnea...    HBP> on ASA81, Norvasc5, off Lasix20, K10-2/d; BP=140/76, tol meds well; denies CP, palpit, ch in SOB, tr edema...    CHOL> on diet alone, refuses meds, FLP 3/14 shows TChol 177, TG 50, HDL 60, LDL 107    DM> on diet alone, wt up sl at 179#, BS in epic=94-127, last A1c (2/13) was 6.3 & she knows to restrict carbs etc...    GI- Reflux, Divertics, constip> prev on Protonix40, now on Zantac150, Miralax; continue same meds.. [name]  DJD/ LBP> prev on Pred10, OTC analgesics prn & osteobiflex prn; had right THR 2011; known sp stenosis w/ prev ESI... We reviewed prob list, meds, xrays and labs> she had the 2016 Flu vaccine... IMP/PLAN>>  Stable overall,  continue current meds, rec to increase exercise & decr intake (work on wt reduction);  We plan ROV recheck w/ blood work in 3 mo...  ~  December 29, 2015:  72m23moV & Gwendolyn Sjogrend her daugh continue to be locked in battle regarding her care (resides at [facility] & has daytime sitter RosAltonom she loves); she persists w/ mult somatic complaints- SOB, irritable, throbbing in her legs, etc;  She saw TP 11/22/15 w/ CC insomnia & fatigue, and her Thyroid function had  not been checked for awhile=> labs showed TSH=0.05 and FreeT4=1.70 (prev TSH was 1.93 in 2014); she was referred to DrGherghe & seen 12/01/15> exam of neck was neg, TFT labs proved thyrotoxicosis, TSI was neg as was a Sed rate (14); they chose to try low dose Methimazole rx (62m/d) as opposed to scan/further testing; she has f/u appt w/ DrGherghe pending...     Breathing is stable on Advair100Bid, plus Flonase,Zyrtek, AlbutHFA prn; she continues to c/o SOB/ DOE...    BP is controlled on Amlod5, Demadex20, K10Bid; BP= 116/58 & she denies CP, palpit, ch in edema, etc...    GI- controlled on Zantac150, Miralax...    She remains on Xanax0.25Bid & Desyrel50Qhs prn sleep (she is out of Zoloft)... EXAM reveals Afeb, VSS, O2sat=95% on RA;  HEENT- neg;  Chest- clear w/o w/r/r;  Heart- RR gr1/6 SEM no r/g;  Abd- soft, non-tender, neg;  Ext- VI, tr edema, no c/c;  Neuro- intact, walks w/ cane, anxious...  LABS 11/2015 in Epic>  TSH=0.05 (confirmed), FreeT4=1.70, FreeT3=4.0, Thy Stim Ig was wnl;  Sed=14...  LABS 12/29/15> [name] insisted on recheck CBC & Iron level>  CBC- wnl w/ Hg=14.6, Fe=99 (31%sat);  Chems- abn w/ Na=130, TCO=39, Cr=1.2 IMP/PLAN>>  We checked the above labs, reminded to use Tylenol prn, no salt, elevate, etc; we decided to decr the Demadex to 1/2 tab Qam & ADD [id number] tab in the afternoon; she is reassured about her health & asked to f/u w/ DrGherghe for Endocrine...  ~  February 09, 2016:  6wk ROV & today is Rosary's 89st birthday!  DIredell notes that pt is better on the Tapazole- less anxious, mood swings diminished, sleeping better, etc; she notes that edema is increased (on Demadex20-1/2 & Diamox250)- reminded to elim sodium etc... We reviewed the following medical problems during today's office visit >>     Chr Obstructive Asthma> her dyspnea is from anxiety/panic not asthma; on Advair100Bid ([facility] requires regular Rx directions) & Proventil rescue prn; notes breathing back to baseline & Alp[raz0.25 helps dyspnea...    HBP> on ASA81, Norvasc5, Demadex20-1/2, Diamox250, K10-2/d; BP=140/70, tol meds well; denies CP, palpit, ch in SOB, but incr edema noted recently...    CHOL> on diet alone, refuses meds, last FLP 3/14 shows TChol 177, TG 50, HDL 60, LDL 107    DM> on diet alone, wt down to 166#, BS in epic=100-150, last A1c (2/13) was 6.3 & she knows to restrict carbs etc...    Thyrotoxicosis> on Tapazole5 per DrGherghe; f/u TFTs 01/10/16 showed TSH=2.39 and she is clinically improved...    GI- Reflux, Divertics, constip> prev on Protonix40, now on Zantac150, Miralax; continue same meds..[name]   DJD/ LBP> prev on Pred10, OTC analgesics prn & osteobiflex prn; had right THR 2011; known sp stenosis w/ prev ESI...    Anxiety, mild senile dementia, difficult interaction betw pt & daugh> on Xanax0.25Bid & Zoloft50 Qhs EXAM reveals Afeb, VSS, O2sat=93% on RA;  HEENT- neg, no thyroid nodule palp;  Chest- clear w/o w/r/r;  Heart- RR gr1/6 SEM no r/g;  Abd- soft, non-tender, neg;  Ext- VI, w/1+ edema, no c/c;  Neuro- intact, walks w/ cane, anxious...  CXR 01/11/16 showed borderline cardiomeg, elev right hemidiaph, clear lungs, DJD in Tspine...  LABS 12/2015>  Chems- ok w/ BS=152;  CBC- ok w/ Hg=14.1;  TSH=2.39 on Tapazole5... IMP/PLAN>>  We reviewed need for no salt, elev legs, wear support hose; decided to incr Demedex20 Qam & continue the Diamox250/d;  we plan ROV recheck in 644mo..  ~  May 21, 2016:  339moOV & post Hosp check>  Since she was  last here MaLevy Sjogrenas had several follow up visits w/ specialists [facility]    She saw DrGherghe 03/01/16>  Throtoxicosis on  Tapazole 64m61mod; they opted not to pursue diagnostic eval (adenoma vs Graves);  TSH=0.03 11/2015 7 improved to 2.39 on 01/10/16...    Seen by DrMayer- Podiatry 04/19/16> bilat big toenails, onychomycosis w/ debridement performed...     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    Above prob list reviewed... EXAM reveals Afeb, VSS, O2sat=95% on RA;  Wt= 175#;  HEENT- neg, no thyroid nodule palp;  Chest- clear w/o w/r/r;  Heart- RR gr1/6 SEM no r/g;  Abd- soft, non-tender, neg;  Ext- VI, w/1+ edema, no c/c;  Neuro- intact, walks w/ cane, anxious...  CXR 04/2016>  Aortic atherosclerosis, elev right hemidiaph, clear lungs, DDD...   CT Head 04/23/16>  Mucosal membrane thickening 7 sm amt fluid in left max sinus, otherw unremarkable- no ischemia or infarct...  CT Chest 04/23/16>  Atherosclerotic Ao & coronaries, no adenopathy, sm thyroid nodules noted no change from 2010, elev right hemidiaph & some basilar atx, DJD in spine w/ spurs & 40% T11 compression  Ba Esophagram 04/30/16>  Mod to severe esoph dysmotility noted, no mass or stricture...  LABS 04/2016>  Chems- ok x Cr=1.3-1.6, BS=100-140;  CBC- wnl;  Thyroid- ok on Tapazole... IMP/PLAN>>  MarHendel  improved & back to baseline; she knows to take her time & be careful eating;  Continue same meds; incr activity level; ROV in 4-6 weeks...   ~  July 16, 2016:  44mo61mo & [name] has settled into [facility], still has RosaHartfordping out part time, "I'm doing pretty good", says she's eating slowly/ small bites/ but whatever she wants, better overall on the Xanax0.25Bid;  From the pulm perspective she's using the Advair100Bid & NEB w/ albut vs Proair as needed but she hasn't needed eitherr ecently... Prev w/ HCO3=39 & Cr=1.2 so we adjusted her diuretics- from Demadex20 to 1/2 of that + Diamox250/d & improved...    DrGherghe rechecked her TFTs and they are euthyroid on Tapazole 64mg 4m...    DaughWhitsettests that we recheck her VitD level on her 2000u daily supplement. EXAM reveals Afeb, VSS, O2sat=96% on RA;  Wt= 177#;  HEENT- neg, no thyroid nodule palp;  Chest- clear w/o w/r/r;  Heart- RR gr1/6 SEM no r/g;  Abd- soft, non-tender, neg;  Ext- VI, w/ tr edema, no c/c;  Neuro- gait abn otherw neg.  LABS 07/16/16>  Chems- wnl w/ HCO3=34, Cr=1.18;  VitD=56... IMP/PLAN>>  Labs look great on current meds> continue Demadex20- 1/2 tab +  Diamox250; continue VitD 2000u supplement; be suere to incr activity at [facility]! We plan recheck 68mo..    ~  December 04, 2016:  4-521moOV & general medical f/u visit>  [name] me that she loves [facility]- friends, the food "we have ice cream every day";  She reports feeling well & denies new complaints or concerns; we reviewed the following medical problems during today's office visit >>     Chr Obstructive Asthma> her dyspnea is more from anxiety/panic & not asthma; on Advair100Bid ([facility] requires regular Rx directions) & Proventil rescue prn; notes that Alpraz0.25 helps dyspnea...    HBP> on ASA81, Norvasc5, Demadex20-1/2, Diamox250; BP=120/62, tol meds well; denies CP, palpit, ch in SOB, but incr edema noted recently => needs low sodium diet, incr  Demadex20 & change diamox to 4PM daily...    CHOL> on diet alone, refuses meds, last FLP 3/14 shows TChol 177, TG 50, HDL 60, LDL 107    DM> on diet alone, wt up to 189#, BS in epic=100-150, last A1c (2/13) was 6.3 & she knows to restrict carbs etc...    Thyrotoxicosis> on Tapazole5-1/2 tab daily per DrGherghe; f/u TFTs 10/02/16 showed TSH=1.78 and FreeT4=0.70, she is clinically improved...    GI- Reflux, Divertics, constip> prev on Protonix40, now on Zantac150, Miralax; continue same meds.. [name]  DJD/ LBP> on OTC analgesics prn & osteobiflex prn; had right THR 2011; known sp stenosis w/ prev ESI...    Anxiety, mild senile dementia, difficult interaction betw pt & daugh> on Xanax0.25Bid & Zoloft50 Qhs EXAM reveals Afeb, VSS, O2sat=93% on RA;  Wt= up 12# to 189#;  HEENT- neg, no thyroid nodule palp;  Chest- clear w/o w/r/r;  Heart- RR gr1/6 SEM no r/g;  Abd- soft, non-tender, neg;  Ext- VI, w/ 1+ edema, no c/c;  Neuro- gait abn otherw neg.  LABS 12/04/16>  BMet- ok w/ HCO3=34, K=4.0, Cr=1.27...  IMP/PLAN>>  OK 2017 Flu shot today;  She is reminded to eliminate sodium/ salt from her diet, increase Demadex20Qam & take the Diamox250 Qd at 4PM;  [facility]...           Problem List:       DYSPNEA (ICD-786.05) - long hx of chronic obstructive asthma treated w/ ADVAIR100Bid & PROAIR (she uses them Prn now)... she is a non-smoker w/ some reactive airways disease in the past & retired from [employer] years in 1991... she denies cough, sputum, hemoptysis, worsening dyspnea, wheezing, chest pains, snoring, daytime hypersomnolence, etc...  ~  baseline CXR w/o acute changes...  ~  PFT's 5/02 w/ FVC 2.07 (68%), FEV1=1.36 (57%), and FEV1/FVC ratio=66%, mid-flows 42%... ~  CT Angio 7/10 was neg- x biapical pleuroparenchymal scarring... ~  CXR 10/11 showed sl elev right hemidaiph, mild DJD sp, osteopenia, NAD...[name]~  CXR 6/12 showed mild apical  scarring, clear & NAD, DJD sp w/ osteophytes... ~  Intermittent dyspnea more related to anxiety & treated w/ KLONOPIN 0.31m7m/2 to 1 tab Bid==> improved. ~  CXR 4/13 showed normal heart size, clear lungs, DJD in TSpine... ~  CXR 2/14 showed normal heart size, clear lungs w/ sl peribronch thickening, DJD in spine, NAD... [name]  CXR 3/15 showed norm heart size, clear lungs, elev of right hemidiaph, NAD... ~  She is encouraged to take the Klonopin 0.31mg73md regularly- consider taking 1/2 in AM, 1/2 in afternoon, one at bedtime... ~  1/16: she continues w/ mult somatic complaints and intermittent choking episodes etc; she refuses to take the Advair or Klonopin... ~  CXR 1/16 showed norm heart size, clear lungs, mild right diaph eventration- no change, Tspine DJD. DISH/ osteopenia; NAD... ~  3/16: she is stable on current meds, asked to incr her non-existent exercise program... ~  4/16: went to ER w/ SOB- nothing found x mild edema; given Lasix40, potassium was low, thought to be anxious; in office f/u her daugh confirms anxiety & pt not taking her meds regularly; asked to take meds every day & try the Klonopin... ~  7/16: same thing- went to ER w/ dyspnea, nothing found & given Ativan but she wouldn't take it... ~  8/16: another episode w/ ER eval and symptoms resolved spontaneously on there own... ~  9/16: pt is advised (again) to start regular dosing of Alpraz 0.38m - 1/2 tab Tid w/ extra 1/2 tab as needed for nerves... ~  12/16: she remains on Advair100-2spBid & Alpraz0.245mid=> improved... ~  3/17: dyspnea further improved w/ Tapazole5m66m Rx for hyperthyroidism... ~  8/17:  She is stable on meds at [facility]- Advair100Bid, Albut prn, diuretics, Alprazolam, Tapazole, etc...  HYPERTENSION (ICD-401.9) - controlled on NORVASC 5mg64mily & HCTZ 25mg68mdaily...  ~  2/13:  BP 144/70 today> tol rx well & denies HA, visual changes, CP, palipit, dizziness, syncope, edema, etc... ~  4/13:  BP= 148/80 & she  denies CP, palpit, edema; dyspnea improved w/ Klonopin. ~  6/13:  BP= 132/68 & as noted she has mult somatic complaints... ~  3/14:  on Norvasc10, HCTZ25; BP=136/60, tol meds well; denies CP, palpit, ch in SOB, edema, etc  ~  9/14:  BP controlled on Amlod5 & Hct25-1/2 daily; BP= 130/70 & she denies CP, palpit, dizzy, SOB, edema, etc. ~  3/15: on ASA81, Norvasc5, Lasix20- 1-2/d; BP=136/70, tol meds well; denies CP, palpit, ch in SOB, but notes persist edema in legs; Rec to ch Amlod5 to Losar50, low sodium, elev legs, Lasix40. ~  5/15: on Losar50, Lasix40;  BP= 138/60 & she denies angina pain, palpit, ch in SOB, etc... ~  7/15: on Losar50, she stopped Lasix; BP= 138/78 and they want to go back on Amlod5 + HCT12.5 daily... ~  9/15: on Amlod5, Hct12.5; BP= 128/80 & she likes this combo better- denies CP, palpit, etc... ~  1/16: on Amlod5, Hct12.5 but ?what she is taking; BP= 130/80 & she has refused the ARBs... ~  3/16: on Amlod5, Hct12.5; BP= 142/80 & she has gained 9# w/ 1-2+ edema; discussed low sodium & change Hct to LASIX20 Qam... ~  5/16: on Amlod5, Lasix20, K20; BP=148/70, recent K=3.0 & daugh notes she's not taking meds regularly; asked to take meds everyday & incr K20Bid... ~  2/17: BP is controlled on Amlod5, Demadex20, K10Bid; BP= 116/58 & she denies CP, palpit, ch in edema, etc... ~  8/17: BP is controlled on Amlod5 + Demadex20-1/2 & diamox250/d;  BP= 128/70 7 rec to continue same...  RIGHT BUNDLE BRANCH BLOCK (ICD-426.4) - on ASA 81mg/22m baseline EKG w/ RBBB and 2DEcho 5/02 showed mild asymmetric LVH w/ incr EF... ~  12/15: she had Cards eval by DrNishan> he rec ARB/diuretic but she refused to switch from her Amlod5/ Hct12.5 regimen; she refused EKG due to "allergy to the electrodes"... ~  2DEcho 1/16 showed norm LV size & function w/ EF=65-70%, AoV leaflets mildly thickened w/o AS, MV leaflets mod thickened w/ trivMR, mild RA dil, PAsys=37mmHg66m~Marland KitchenMarland Kitchen  EKG 4/16 showed NSR, rate72, PACs,  LAD, RBBB  VENOUS INSUFFIC & EDEMA >>  ~  3/15: she was switched off Amlod & onto Losar50, Lasix20=>40, plus no salt, elevation, support hose, etc...  ~  9/15: they preferred the [facility] & HCT12.5 regimen; she has VI & 1+edema but stable, no acute changes... 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 ~  3/16: she remains on the Pred from Derm, now w/ incr edema & 9# wt gain, rec no salt & change Hct to LASIX20/d... ~  5/16: improved on Lasix20 w/ edema decr 7 wt down several lbs... ~  12/16: on ASA81, Norvasc5, off Lasix20, K10-2/d; BP=140/76, tol meds well; denies CP, palpit, ch in SOB, tr edema. ~  3/17: edema increased w/ decr in diuretics and incr sodium intake at [facility]; rec to incr Demadex20/d + Diamox250/d and K10Bid...  HYPERLIPIDEMIA (ICD-272.4) - on diet alone... she forgets to come to visits FASTING for this blood work ~  [place] 8/07 showed TChol 205, TG 71, HDL 49, LDL 130... ~  FLP 6/12 on diet alone showed TChol 218, TG 50, HDL 66, LDL 129 ~  FLP 2/13 on diet alone showed TChol 171, TG 43, HDL 66, LDL 97 ~  FLP 3/14 on diet alone showed TChol 177, TG 50, HDL 60, LDL 107 ~  She needs to ret FASTING for f/u FLP...  DIABETES MELLITUS, BORDERLINE (ICD-790.29) - on diet alone w/ prev BS's in the 100-160 range... ~  labs in 2008-9 showed BS= 101 to 108 ~  labs 1/10 showed BS= 106, A1c= 5.9 ~  Labs 6/12 showed BS= 93, A1c= 6.6.Marland KitchenMarland Kitchen rec diet, exercise... ~  Labs 2/13 showed BS= 92, A1c= 6.3 ~  3/14: on diet alone, wt stable ~174#, BS=97, last A1c (2/13) was 6.3 & she knows to restrict carbs etc. ~  Labs 3/15 showed BS= 95; and BS= 120 in [id number]...  ~  Labs 2016 showed BS= 101-135 on diet alone... ~  Labs in 2017 showed BS= 100-150 range on diet alone  THYROTOXICOSIS >> routine labs 11/2015 showed thyrotoxicosis & referred to Endocrine, DrGherghe- Rx w/ Tapazole10m/d & she is clinically improved... ~  Labs 2/13 showed TSH= 2.86 ~  Labs 3/14  showed TSH=1.93 ~  Labs 1/17 showed TSH=0.03-0.09;  FreeT3=4.0;  FreeT4=1.70 => referred to DrGherghe, started on Tapazole5 ~  Labs 2/17 showed TSH=2.93;  FreeT3=2.5;  FreeT4=0.76 ~  She continues to f/u w/ drGherghe for thyroid...  INDIGESTION/ REFLUX SYMPTOMS >> see 10/12 note & PROTONIX 476md started, further eval if symptoms persist... ESOPHAGEAL DYSMOTILITY/ PRESBYESOPHAGUS >>  ~  4/13:  She notes some reflux symptoms and excess gas w/ belching; rec to take the Protonix daily & Simethacone vs Tums which she says helps her gas. ~  5/13:  She saw GI DrPerry w/ rec to take Prilosec for her indigestion... ~  5/15:  She presented w/ worsening indigestion, dysphagia, reflux & Prev30 was incr to Bid w/ GI f/u suggested for EGD... 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 ~  9/15:  Symptoms persist but she is not regurg or vomiting,  and weight stable; they want to change to Nexium40Bid-OK, and rec proceed w/ MBS by speech path... ~  She continues to have intermit choking episodes and c/o phlegm in her throat; she has tried "everything" & encouraged to f/u w/ GI- DrPerry/ DrJEdwards... ~  She stopped the PPI rx in favor of Zantac150...  DIVERTICULOSIS OF COLON (ICD-562.10) - she takes SENAKOT-S, MIRALAX, Peppermint Tea, & sauerkraut Prn...last colonoscopy 9/02 by DrPerry was WNL...  PYELONEPHRITIS (ICD-590.80) - SEE 1/09 Hospitalization (reviewed)... ~  She saw DrMacDiarmid for her recurrent UTIs, chronic cystitis, urge & stress incont, nocturia; she is INTOL to [place]...  Hx of BREAST CYST (ICD-610.0)  DEGENERATIVE JOINT DISEASE (ICD-715.90) - s/p right hip hemiarthroplasty 11/09 by  DrAplington w/ wound complic... then dx w/ loosening of the femoral shaft & had conversion to right THR by DrAlusio 10/11 & much improved... she uses CELEBREX 222m Prn (seldom takes this).  LOW BACK PAIN SYNDROME (ICD-724.2) & SPINAL STENOSIS (ICD-724.00) - severe LBP & spinal stenosis w/ evals by DrRamos & DrNudelman... s/p shots, considering poss surgery vs alternative therapies... she takes Celebrex, Osteobiflex, MVI, Vit D... ~  8/10: eval by DrAplington- diff leg lengths, lift placed in right shoe, then trial Lyrica558m.. ~  12/11:  improved after hip revision surg (to THR) 10/11 w/ better ambulaton... ~  5/13:  DrRamos gave her another ESI for her leg pain related to sp stenosis... ~  3/14:  C/o neuropathic discomfort in legs- eval by Ramos & offered shots in her back; try Lyrica50 in the interim... ~  Persistent leg pain, on OTC "natural" meds she says; encouraged to f/u w/ DrRamos et al...  Hx of ANEMIA (ICD-285.9) - eval by GI in 2002 showed normal EGD and Colon... prob iron malabsorption problem Rx'd w/ Fe infusion... ~  labs 1/10 showed Hg= 14.6, MCV= 89, Fe= 94 ~  labs 12/11 showed Hg= 12.8, MCV= 90, Fe= 33... try Fe supplement + VitC... ~  Labs 6/12 showed Hg= 15.0 ~  Labs 2/13 showed Hg= 14.6 ~  Labs 2/14 showed Hg= 14.8 ~  Labs 3/15 showed Hg= 14.9 ~  Labs 1/16 showed Hg= 14.4 ~  Labs 4/16 showed Hg= 13.7 ~  Labs 2/17 showed Hg= 14.1  DERM:  rash Rx'd by dermatology- OLUX-E foam= clobetasol Foam 0.05%... 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 ~  5/16: she is still on Pred per Derm- currently 1025m...    Past Surgical History:  Procedure Laterality Date  . CATARACT EXTRACTION    . conversion to right THR    . right hip hemiarthroplasty     total    Outpatient Encounter Prescriptions as of 12/04/2016  Medication Sig  . acetaminophen (TYLENOL) 325 MG tablet Take 650 mg by mouth every 4 (four) hours as  needed for mild pain.  . aMarland KitchenetaZOLAMIDE (DIAMOX) 250 MG tablet TAKE (1) TABLET BY MOUTH EACH MORNING. (Patient taking differently: TAKE 250 MG BY MOUTH EACH MORNING.)  . ALPRAZolam (XANAX) 0.25 MG tablet Take 1 tablet (0.25 mg total) by mouth 2 (two) times daily.  . aMarland KitchenLODipine (NORVASC) 5 MG tablet Take 1 tablet (5 mg total) by mouth daily.  . ASPIRIN LOW DOSE 81 MG EC tablet TAKE ONE TABLET BY MOUTH ONCE DAILY.  . cetirizine (ZYRTEC) 10 MG tablet TAKE 1 TABLET BY MOUTH ONCE DAILY FOR ALLERGIES. (Patient taking differently: TAKE 10 MG BY MOUTH ONCE DAILY FOR ALLERGIES.)  .  Cholecalciferol (VITAMIN D3) 2000 units capsule Take 1 capsule by mouth once daily  . fluticasone (FLONASE) 50 MCG/ACT nasal spray SPRAY 2 SPRAYS INTO EACH NOSTRIL ONCE DAILY.  [name] Fluticasone-Salmeterol (ADVAIR DISKUS) 100-50 MCG/DOSE AEPB Inhale 1 puff into the lungs 2 (two) times daily.  [name] guaifenesin (ROBITUSSIN) 100 MG/5ML syrup Take 200 mg by mouth 3 (three) times daily as needed for cough.  [name] ipratropium-albuterol (DUONEB) 0.5-2.5 (3) MG/3ML SOLN Take 3 mLs by nebulization every 4 (four) hours as needed.  . methimazole (TAPAZOLE) 5 MG tablet Take 2.32m every other day.  . Multiple Vitamin (DAILY-VITE) TABS TAKE ONE TABLET BY MOUTH ONCE DAILY.  .Marland Kitchenondansetron (ZOFRAN) 4 MG tablet Take 1 tablet (4 mg total) by mouth every 6 (six) hours as needed for nausea or vomiting.  . polyethylene glycol powder (GLYCOLAX/MIRALAX) powder MIX 1 CAPFUL (17G) IN 8 OUNCES OF JUICE/WATER AND DRINK ONCE DAILY ASNEEDED FOR CONSTIPATION.  .Marland KitchenPROAIR HFA 108 (90 Base) MCG/ACT inhaler USE 1 TO 2 PUFFS EVERY SIX HOURS AS NEEDED.  . ranitidine (ZANTAC) 150 MG tablet TAKE ONE TABLET BY MOUTH ONCE DAILY.  .Marland Kitchensertraline (ZOLOFT) 50 MG tablet TAKE (1) TABLET BY MOUTH AT BEDTIME. (Patient taking differently: Take 50 mg by mouth at bedtime. )  . Spacer/Aero-Holding Chambers (AEROCHAMBER PLUS WITH MASK) inhaler Use as instructed  . torsemide (DEMADEX) 20 MG  tablet TAKE 1/2 TABLET BY MOUTH EVERY MORNING. (Patient taking differently: TAKE 20 MG BY MOUTH EVERY MORNING.)  . [DISCONTINUED] polyethylene glycol (MIRALAX / GLYCOLAX) packet Take 17 g by mouth daily as needed for mild constipation.    No facility-administered encounter medications on file as of 12/04/2016.     Allergies  Allergen Reactions  . Azithromycin Shortness Of Breath  . Ciprofloxacin Other (See Comments)     hallucinations  . Levofloxacin Other (See Comments)    Insomnia, indigestion, tingling sensation in legs  . Furosemide Rash    Bullous pemphigoid  . Latex Rash  . Other Rash    EKG leads caused a rash that required steroids to clear    Current Medications, Allergies, Past Medical History, Past Surgical History, Family History, and Social History were reviewed in [employer].    Review of Systems         See HPI - all other systems neg except as noted... The patient complains of decreased hearing, dyspnea on exertion, muscle weakness, and difficulty walking.  The patient denies anorexia, fever, weight loss, weight gain, vision loss, hoarseness, chest pain, syncope, peripheral edema, prolonged cough, headaches, hemoptysis, abdominal pain, melena, hematochezia, severe indigestion/heartburn, hematuria, incontinence, suspicious skin lesions, transient blindness, depression, unusual weight change, abnormal bleeding, enlarged lymph nodes, and angioedema.     Objective:   Physical Exam     WD, WN, Chr ill appearing 83y/o WF in NAD... GENERAL:  Alert & oriented; pleasant & cooperative... HEENT:  [place]/AT, EOM-full, EACs-clear, TMs-wnl, NOSE-clear, THROAT-clear & wnl. NECK:  Supple w/ fairROM; no JVD; normal carotid impulses w/o bruits; no thyromegaly or nodules palpated; no lymphadenopathy. CHEST:  Clear to P & A; without wheezes/ rales/ or rhonchi heard... HEART:  Regular Rhythm; without murmurs/ rubs/ or gallops detected... ABDOMEN:  Soft &  nontender; normal bowel sounds; no organomegaly or masses palpated... EXT:  mod arthritic changes, walks w/ cane, +venous insuffic & incr 1-2+ edema., scattered varicose veins... NEURO:  CN's intact; motor testing normal; no focal deficits... DERM:   mild intertrig rash under breast, & onychomycosis of  toenails...  RADIOLOGY DATA:  Reviewed in the EPIC EMR & discussed w/ the patient...  LABORATORY DATA:  Reviewed in the EPIC EMR & discussed w/ the patient...   Assessment & Plan:    Hx episodes of SOB>> see above, she has been resistent to trying any of the meds & treatment suggestions that we have given to her (I know she would improve on Benzo rx)...  12/16> symptoms are better w/ Advair100-2spBid & Alprazolam 0.63mBid admin regularly at [facility]... 12/29/15> we checked the above labs, reminded to use Tylenol prn, no salt, elevate, etc; we decided to decr the Demadex to 1/2 tab Qam & ADD [id number] tab in the afternoon; she is reassured about her health & asked to f/u w/ DrGherghe for Endocrine re thyroid... 07/16/16>   She is stable w/o recurrent dyspnea spells  Hx Asthma> stable on Advair100 & Proair prn; hx anxiety component on Klonopin in past but she is not using! Now on Alpraz & improved.  HBP>  Controlled on Amlod5 + Demadex + Diamox, & off KCl; reminded to take meds regularly & no salt...  RBBB>  Aware & denies CP, palpit, ch in DOE, etc... she thinks that she is allergic to EKG electrodes; EKG 4./16 in ER showed NSR, rate72, PACs, LAD, RBBB  CHOL>  On diet alone & FLP looks reasonable;  We reviewed low chol, low fat diet...  Borderline DM>  BS= 101-135 & last A1c is 6.3;  on diet alone & we reviewed low carb no sweets etc...  Thyrotoxicosis>  Improved on Tapazole5 per DrGherghe => down to 1/2 tab daily...  GI> Indigestion, Divertics> she notes most bowel symptoms resolved off spicey foods;  UGI symptoms w/ dysphagia, food sticking, belch/gas/ etc have not resolved on  Protonix40Bid and after GI consult w/ DrPerry; we reviewed her UGI series results and decided to proceed w/ MBS by Speech Path; transiently improved then sought 2nd opinion from DrJEdwards=> on Nexium40Bid...  UTI>  Klebsiella UTI resolved after Septra Rx... She has been eval by DrMacDiarmid.  DJD, LBP, Spinal Stenosis>  Prev evals by Ortho, DrRamos, DrNudelman etc; improved after THR w/ better ambulation; c/o neuropathic discomfort in legs- she will f/u w/ Ramos for shots, using OTC "natural" meds...  Anxiety>  If she would take the Alpraz, I feel it would help...   Patient's Medications  New Prescriptions   No medications on file  Previous Medications   ACETAMINOPHEN (TYLENOL) 325 MG TABLET    Take 650 mg by mouth every 4 (four) hours as needed for mild pain.   ACETAZOLAMIDE (DIAMOX) 250 MG TABLET    TAKE (1) TABLET BY MOUTH EACH MORNING.   ALPRAZOLAM (XANAX) 0.25 MG TABLET    Take 1 tablet (0.25 mg total) by mouth 2 (two) times daily.   AMLODIPINE (NORVASC) 5 MG TABLET    Take 1 tablet (5 mg total) by mouth daily.   ASPIRIN LOW DOSE 81 MG EC TABLET    TAKE ONE TABLET BY MOUTH ONCE DAILY.   CETIRIZINE (ZYRTEC) 10 MG TABLET    TAKE 1 TABLET BY MOUTH ONCE DAILY FOR ALLERGIES.   CHOLECALCIFEROL (VITAMIN D3) 2000 UNITS CAPSULE    Take 1 capsule by mouth once daily   FLUTICASONE (FLONASE) 50 MCG/ACT NASAL SPRAY    SPRAY 2 SPRAYS INTO EACH NOSTRIL ONCE DAILY.   FLUTICASONE-SALMETEROL (ADVAIR DISKUS) 100-50 MCG/DOSE AEPB    Inhale 1 puff into the lungs 2 (two) times daily.   GUAIFENESIN (ROBITUSSIN) 100 MG/5ML  SYRUP    Take 200 mg by mouth 3 (three) times daily as needed for cough.   IPRATROPIUM-ALBUTEROL (DUONEB) 0.5-2.5 (3) MG/3ML SOLN    Take 3 mLs by nebulization every 4 (four) hours as needed.   METHIMAZOLE (TAPAZOLE) 5 MG TABLET    Take 2.43m every other day.   MULTIPLE VITAMIN (DAILY-VITE) TABS    TAKE ONE TABLET BY MOUTH ONCE DAILY.   ONDANSETRON (ZOFRAN) 4 MG TABLET    Take 1 tablet (4  mg total) by mouth every 6 (six) hours as needed for nausea or vomiting.   POLYETHYLENE GLYCOL POWDER (GLYCOLAX/MIRALAX) POWDER    MIX 1 CAPFUL (17G) IN 8 OUNCES OF JUICE/WATER AND DRINK ONCE DAILY ASNEEDED FOR CONSTIPATION.   PROAIR HFA 108 (90 BASE) MCG/ACT INHALER    USE 1 TO 2 PUFFS EVERY SIX HOURS AS NEEDED.   RANITIDINE (ZANTAC) 150 MG TABLET    TAKE ONE TABLET BY MOUTH ONCE DAILY.   SERTRALINE (ZOLOFT) 50 MG TABLET    TAKE (1) TABLET BY MOUTH AT BEDTIME.   SPACER/AERO-HOLDING CHAMBERS (AEROCHAMBER PLUS WITH MASK) INHALER    Use as instructed   TORSEMIDE (DEMADEX) 20 MG TABLET    TAKE 1/2 TABLET BY MOUTH EVERY MORNING.  Modified Medications   No medications on file  Discontinued Medications   POLYETHYLENE GLYCOL (MIRALAX / GLYCOLAX) PACKET    Take 17 g by mouth daily as needed for mild constipation.

## 2016-12-04 NOTE — Patient Instructions (Signed)
Today we updated your med list in our EPIC system...    Continue your current medications the same...  Today we checked your metabolic panel to see if we can increase the fluid pill slightly...    We will contact you w/ the results when available...   Stay as active as possible in the [facility] exercise classes!!!  Call for any questions...  Let's plan a follow up visit in [phone], sooner if needed for problems.[name]..[name]

## 2016-12-12 NOTE — Progress Notes (Signed)
Spoke with [name], pt daughter, and informed her of lab results. She did not have any additional questions. BMET results faxed to [facility]. Nothing further needed.

## 2016-12-13 ENCOUNTER — Other Ambulatory Visit: Payer: Medicare Other

## 2016-12-17 ENCOUNTER — Telehealth: Payer: Self-pay | Admitting: Pulmonary Disease

## 2016-12-17 MED ORDER — DOXYCYCLINE HYCLATE 100 MG PO TABS: 100.0000 mg | ORAL_TABLET | Freq: Two times a day (BID) | ORAL | 0 refills | Status: DC

## 2016-12-17 MED ORDER — HYDROCODONE-HOMATROPINE 5-1.5 MG/5ML PO SYRP: 5.0000 mL | ORAL_SOLUTION | Freq: Four times a day (QID) | ORAL | 0 refills | Status: DC | PRN

## 2016-12-17 NOTE — Telephone Encounter (Signed)
Spoke with pt's daughter, [name]. States that pt is not feeling well. Reports cough x1 week. Cough is non productive. Denies chest tightness, wheezing, SOB or fever. Pt has been taking Mucinex DM and Robitussin with no relief. [name] would like SN's recommendations.  SN - please advise. Thanks.  Allergies  Allergen Reactions  . Azithromycin Shortness Of Breath  . Ciprofloxacin Other (See Comments)     hallucinations  . Levofloxacin Other (See Comments)    Insomnia, indigestion, tingling sensation in legs  . Furosemide Rash    Bullous pemphigoid  . Latex Rash  . Other Rash    EKG leads caused a rash that required steroids to clear

## 2016-12-17 NOTE — Telephone Encounter (Signed)
Per SN he said send in Doxy 100 #14 1 po BID, and Hycodan 6oz 1 tsp po q6h prn for cough. Spoke with daughter and gave her SN recc. She is aware that she will need to come to the office and pick up medication. The rx was printed and signed by SN and placed up front and doxy. [facility] to [facility]. Nothing further is needed at this time.

## 2017-01-17 ENCOUNTER — Other Ambulatory Visit: Payer: Self-pay | Admitting: Pulmonary Disease

## 2017-01-22 ENCOUNTER — Other Ambulatory Visit: Payer: Self-pay | Admitting: Pulmonary Disease

## 2017-01-23 ENCOUNTER — Other Ambulatory Visit: Payer: Self-pay | Admitting: Pulmonary Disease

## 2017-02-26 ENCOUNTER — Ambulatory Visit: Payer: Medicare Other | Admitting: Internal Medicine

## 2017-02-27 ENCOUNTER — Ambulatory Visit: Payer: Medicare Other | Admitting: Internal Medicine

## 2017-04-05 ENCOUNTER — Telehealth: Payer: Self-pay | Admitting: Pulmonary Disease

## 2017-04-05 NOTE — Telephone Encounter (Signed)
Called and spoke with pts daughter and she stated that we will need to fax something stating that the pt will need to stop the aspirin 3 days prior to her surgery next week.  This will go do [facility]  At [facility]  And the pts daughter will pick up the handicap placard next week.

## 2017-04-05 NOTE — Telephone Encounter (Signed)
[name] called for her mother and she stated that she will be having oral surgery next week.  They wanted to see if she needed to stop taking her aspirin or any other of her meds prior to this surgery.  They are also requesting a handicap placard as well.  SN please advise. Thanks  Allergies  Allergen Reactions  . Azithromycin Shortness Of Breath  . Ciprofloxacin Other (See Comments)     hallucinations  . Levofloxacin Other (See Comments)    Insomnia, indigestion, tingling sensation in legs  . Furosemide Rash    Bullous pemphigoid  . Latex Rash  . Other Rash    EKG leads caused a rash that required steroids to clear     Current Outpatient Prescriptions on File Prior to Visit  Medication Sig Dispense Refill  . acetaminophen (TYLENOL) 325 MG tablet Take 650 mg by mouth every 4 (four) hours as needed for mild pain.    [name]. acetaZOLAMIDE (DIAMOX) 250 MG tablet TAKE (1) TABLET BY MOUTH EACH MORNING. 30 tablet 5  . ALPRAZolam (XANAX) 0.25 MG tablet Take 1 tablet (0.25 mg total) by mouth 2 (two) times daily. 60 tablet 5  . amLODipine (NORVASC) 5 MG tablet TAKE ONE TABLET BY MOUTH ONCE DAILY. 30 tablet 0  . ASPIRIN LOW DOSE 81 MG EC tablet TAKE ONE TABLET BY MOUTH ONCE DAILY. 30 tablet 0  . cetirizine (ZYRTEC) 10 MG tablet TAKE 1 TABLET BY MOUTH ONCE DAILY FOR ALLERGIES. (Patient taking differently: TAKE 10 MG BY MOUTH ONCE DAILY FOR ALLERGIES.) 30 tablet 5  . Cholecalciferol (VITAMIN D3) 2000 units capsule Take 1 capsule by mouth once daily 30 capsule 11  . doxycycline (VIBRA-TABS) 100 MG tablet Take 1 tablet (100 mg total) by mouth 2 (two) times daily. 14 tablet 0  . fluticasone (FLONASE) 50 MCG/ACT nasal spray SPRAY 2 SPRAYS INTO EACH NOSTRIL ONCE DAILY. 16 g 5  . Fluticasone-Salmeterol (ADVAIR DISKUS) 100-50 MCG/DOSE AEPB Inhale 1 puff into the lungs 2 (two) times daily. 60 each 5  . guaifenesin (ROBITUSSIN) 100 MG/5ML syrup Take 200 mg by mouth 3 (three) times daily as needed for cough.    [name].  HYDROcodone-homatropine (HYCODAN) 5-1.5 MG/5ML syrup Take 5 mLs by mouth every 6 (six) hours as needed for cough. 180 mL 0  . ipratropium-albuterol (DUONEB) 0.5-2.5 (3) MG/3ML SOLN Take 3 mLs by nebulization every 4 (four) hours as needed. 360 mL 0  . methimazole (TAPAZOLE) 5 MG tablet Take 2.5mg  every other day. 30 tablet 0  . Multiple Vitamin (DAILY-VITE) TABS TAKE ONE TABLET BY MOUTH ONCE DAILY. 30 tablet 11  . ondansetron (ZOFRAN) 4 MG tablet Take 1 tablet (4 mg total) by mouth every 6 (six) hours as needed for nausea or vomiting. 15 tablet 0  . polyethylene glycol powder (GLYCOLAX/MIRALAX) powder MIX 1 CAPFUL (17G) IN 8 OUNCES OF JUICE/WATER AND DRINK ONCE DAILY ASNEEDED FOR CONSTIPATION. 527 g 0  . PROAIR HFA 108 (90 Base) MCG/ACT inhaler USE 1 TO 2 PUFFS EVERY SIX HOURS AS NEEDED. 8.5 g 0  . ranitidine (ZANTAC) 150 MG tablet TAKE ONE TABLET BY MOUTH ONCE DAILY. 30 tablet 5  . sertraline (ZOLOFT) 50 MG tablet TAKE (1) TABLET BY MOUTH AT BEDTIME. 30 tablet 2  . Spacer/Aero-Holding Chambers (AEROCHAMBER PLUS WITH MASK) inhaler Use as instructed 1 each 2  . torsemide (DEMADEX) 20 MG tablet TAKE 1/2 TABLET BY MOUTH EVERY MORNING. (Patient taking differently: TAKE 20 MG BY MOUTH EVERY MORNING.) 15 tablet 11  No current facility-administered medications on file prior to visit.

## 2017-04-08 NOTE — Telephone Encounter (Signed)
Form has been faxed to [facility] and and the handicap placard has been signed and placed up front for her daughter to pick this up. Nothing further is needed.

## 2017-04-16 ENCOUNTER — Ambulatory Visit (INDEPENDENT_AMBULATORY_CARE_PROVIDER_SITE_OTHER): Payer: Medicare Other | Admitting: Internal Medicine

## 2017-04-16 ENCOUNTER — Other Ambulatory Visit (INDEPENDENT_AMBULATORY_CARE_PROVIDER_SITE_OTHER): Payer: Medicare Other

## 2017-04-16 ENCOUNTER — Encounter: Payer: Self-pay | Admitting: Internal Medicine

## 2017-04-16 VITALS — BP 124/74 | HR 83 | Wt 181.0 lb

## 2017-04-16 DIAGNOSIS — E0590 Thyrotoxicosis, unspecified without thyrotoxic crisis or storm: Secondary | ICD-10-CM | POA: Diagnosis not present

## 2017-04-16 LAB — TSH: TSH: 2.55 u[IU]/mL (ref 0.35–4.50)

## 2017-04-16 LAB — T3, FREE: T3 FREE: 2.6 pg/mL (ref 2.3–4.2)

## 2017-04-16 LAB — T4, FREE: Free T4: 0.75 ng/dL (ref 0.60–1.60)

## 2017-04-16 NOTE — Progress Notes (Signed)
Patient ID: [name], female   DOB: December 17, 1922, 83 y.o.   MRN: [id number]   HPI  [name] is a 83 y.o.-year-old female,  returning for follow-up for thyrotoxicosis. Last visit 6 months ago. She is here with her daughter [name]) who is also my pt.  Pt is living in [facility] ([facility]).  She had dental work recently >> could not eat well.  Reviewed and addended history: Pt developed anxiety attacks/panic attacks/sundowning >> repeated ED visits. She also was getting SOB with any activity, increased appetite and has insomnia. + nocturia and leg swelling.  TFTs returned abnormal when checked in 11/2015, indicating thyrotoxicosis.  Due to age and also due to the fact that the TFTs appeared to be improving when I saw her last, we decided to start a low dose methimazole, but we did not pursue further investigation, assuming either mild Graves ds or toxic adenoma, both amenable to MMI.  We started methimazole, initially 5 mg once daily.  After we started methimazole, patient started to feel better and also sleep better. We tried to stop at last visit, since TSH started to increase and she gained weight. However, she became irritable, angry and not sleeping at night. We restarted a lower dose methimazole, 2.5 mg daily in 09/2016, and subsequent tests have been normal a month later.  I reviewed pt's thyroid tests: Lab Results  Component Value Date   TSH 3.10 10/24/2016   TSH 1.78 10/02/2016   TSH 3.11 08/28/2016   TSH 2.45 06/11/2016   TSH 3.87 04/12/2016   TSH 7.97 (H) 03/01/2016   TSH 2.39 01/10/2016   TSH 0.09 (L) 12/01/2015   TSH 0.03 (L) 11/23/2015   TSH 0.05 Repeated and verified X2. (L) Jun 27, 202017   FREET4 0.66 10/24/2016   FREET4 0.70 10/02/2016   FREET4 0.60 08/28/2016   FREET4 0.61 06/11/2016   FREET4 0.57 (L) 04/12/2016   FREET4 0.62 03/01/2016   FREET4 0.76 01/10/2016   FREET4 1.47 12/01/2015   FREET4 1.70 (H) 11/23/2015    Lab Results  Component  Value Date   TSI 88 12/01/2015   Pt denies: - feeling nodules in neck - hoarseness - dysphagia - choking - SOB with lying down  Pt denies: - weight gain >> lost 8 lbs since last visit mostly 2/2 dental work - heat intolerance - tremors - palpitations - anxiety - hyperdefecation - hair loss  Pt does not have a FH of thyroid ds. No FH of thyroid cancer. No h/o radiation tx to head or neck.  No seaweed or kelp. No recent contrast studies. No herbal supplements. No Biotin use. No recent steroids use.   She has a history of bullous pemphigoid >> previously on prednisone.  ROS: Constitutional: + weight loss, + fatigue, no subjective hyperthermia, no subjective hypothermia Eyes: no blurry vision, no xerophthalmia ENT: no sore throat, no nodules palpated in throat, no dysphagia, no odynophagia, no hoarseness Cardiovascular: no CP/n+ SOB/no palpitations/no leg swelling Respiratory: no cough/+ SOB/no wheezing Gastrointestinal: no N/no V/no D/no C/no acid reflux Musculoskeletal: no muscle aches/no joint aches Skin: no rashes, no hair loss Neurological: no tremors/no numbness/no tingling/no dizziness  I reviewed pt's medications, allergies, PMH, social hx, family hx, and changes were documented in the history of present illness. Otherwise, unchanged from my initial visit note.   Past Medical History:  Diagnosis Date  . Anemia, unspecified   . Diverticulosis of colon (without mention of hemorrhage)   . DJD (degenerative joint disease)   .  GERD (gastroesophageal reflux disease)   . Lumbago   . Osteoarthrosis, unspecified whether generalized or localized, unspecified site   . Other abnormal glucose   . Other and unspecified hyperlipidemia   . Pyelonephritis   . Pyelonephritis, unspecified   . Right bundle branch block   . Shortness of breath   . Solitary cyst of breast   . Spinal stenosis, unspecified region other than cervical   . Unspecified essential hypertension   . UTI  (lower urinary tract infection)    Past Surgical History:  Procedure Laterality Date  . CATARACT EXTRACTION    . conversion to right THR    . right hip hemiarthroplasty     total   Social History   Social History  . Marital Status: Divorced    Spouse Name: N/A  . Number of Children: 3- 1 deceased   Occupational History  . retired    Social History Main Topics  . Smoking status: Never Smoker   . Smokeless tobacco: Never Used  . Alcohol Use: No  . Drug Use: No   Pt lives in [facility].  Current Outpatient Prescriptions on File Prior to Visit  Medication Sig Dispense Refill  . acetaminophen (TYLENOL) 325 MG tablet Take 650 mg by mouth every 4 (four) hours as needed for mild pain.    [name] acetaZOLAMIDE (DIAMOX) 250 MG tablet TAKE (1) TABLET BY MOUTH EACH MORNING. 30 tablet 5  . ALPRAZolam (XANAX) 0.25 MG tablet Take 1 tablet (0.25 mg total) by mouth 2 (two) times daily. 60 tablet 5  . amLODipine (NORVASC) 5 MG tablet TAKE ONE TABLET BY MOUTH ONCE DAILY. 30 tablet 0  . ASPIRIN LOW DOSE 81 MG EC tablet TAKE ONE TABLET BY MOUTH ONCE DAILY. 30 tablet 0  . cetirizine (ZYRTEC) 10 MG tablet TAKE 1 TABLET BY MOUTH ONCE DAILY FOR ALLERGIES. (Patient taking differently: TAKE 10 MG BY MOUTH ONCE DAILY FOR ALLERGIES.) 30 tablet 5  . Cholecalciferol (VITAMIN D3) 2000 units capsule Take 1 capsule by mouth once daily 30 capsule 11  . doxycycline (VIBRA-TABS) 100 MG tablet Take 1 tablet (100 mg total) by mouth 2 (two) times daily. 14 tablet 0  . fluticasone (FLONASE) 50 MCG/ACT nasal spray SPRAY 2 SPRAYS INTO EACH NOSTRIL ONCE DAILY. 16 g 5  . Fluticasone-Salmeterol (ADVAIR DISKUS) 100-50 MCG/DOSE AEPB Inhale 1 puff into the lungs 2 (two) times daily. 60 each 5  . guaifenesin (ROBITUSSIN) 100 MG/5ML syrup Take 200 mg by mouth 3 (three) times daily as needed for cough.    [name] HYDROcodone-homatropine (HYCODAN) 5-1.5 MG/5ML syrup Take 5 mLs by mouth every 6 (six) hours as needed for cough. 180 mL 0  .  ipratropium-albuterol (DUONEB) 0.5-2.5 (3) MG/3ML SOLN Take 3 mLs by nebulization every 4 (four) hours as needed. 360 mL 0  . methimazole (TAPAZOLE) 5 MG tablet Take 2.5mg  every other day. 30 tablet 0  . Multiple Vitamin (DAILY-VITE) TABS TAKE ONE TABLET BY MOUTH ONCE DAILY. 30 tablet 11  . ondansetron (ZOFRAN) 4 MG tablet Take 1 tablet (4 mg total) by mouth every 6 (six) hours as needed for nausea or vomiting. 15 tablet 0  . polyethylene glycol powder (GLYCOLAX/MIRALAX) powder MIX 1 CAPFUL (17G) IN 8 OUNCES OF JUICE/WATER AND DRINK ONCE DAILY ASNEEDED FOR CONSTIPATION. 527 g 0  . PROAIR HFA 108 (90 Base) MCG/ACT inhaler USE 1 TO 2 PUFFS EVERY SIX HOURS AS NEEDED. 8.5 g 0  . ranitidine (ZANTAC) 150 MG tablet TAKE ONE TABLET BY  MOUTH ONCE DAILY. 30 tablet 5  . sertraline (ZOLOFT) 50 MG tablet TAKE (1) TABLET BY MOUTH AT BEDTIME. 30 tablet 2  . Spacer/Aero-Holding Chambers (AEROCHAMBER PLUS WITH MASK) inhaler Use as instructed 1 each 2  . torsemide (DEMADEX) 20 MG tablet TAKE 1/2 TABLET BY MOUTH EVERY MORNING. (Patient taking differently: TAKE 20 MG BY MOUTH EVERY MORNING.) 15 tablet 11   No current facility-administered medications on file prior to visit.    Allergies  Allergen Reactions  . Azithromycin Shortness Of Breath  . Ciprofloxacin Other (See Comments)     hallucinations  . Levofloxacin Other (See Comments)    Insomnia, indigestion, tingling sensation in legs  . Furosemide Rash    Bullous pemphigoid  . Latex Rash  . Other Rash    EKG leads caused a rash that required steroids to clear   Family History  Problem Relation Age of Onset  . Cancer Brother   . Cancer Brother   . Heart failure Father    PE: BP 124/74 (BP Location: Left Arm, Patient Position: Sitting)   Pulse 83   Wt 181 lb (82.1 kg)   SpO2 91%   BMI 27.93 kg/m   Body mass index is 27.93 kg/m. Wt Readings from Last 3 Encounters:  04/16/17 181 lb (82.1 kg)  12/04/16 189 lb (85.7 kg)  08/28/16 179 lb (81.2 kg)    Constitutional: overweight, in NAD, walks with a walker Eyes: PERRLA, EOMI, no exophthalmos ENT: moist mucous membranes, no thyromegaly, no cervical lymphadenopathy Cardiovascular: RRR, + 1/6 SEM, no RG, + B LE pitting edema +2 Up to her knees Respiratory: CTA B Gastrointestinal: abdomen soft, NT, ND, BS+ Musculoskeletal: no deformities, strength intact in all 4 Skin: moist, warm, + stasis dermatitis bilateral legs Neurological: + tremor with outstretched hands, DTR normal in all 4  ASSESSMENT: 1. Thyrotoxicosis  PLAN:  1. Patient with low TSH + high fT4 in 11/2015, with thyrotoxic sxs: Anxiety, irritability, tremors, all resolved after she started methimazole. We tried to stop methimazole twice, however, she had a return of her symptoms. Therefore, we are maintaining her on the low dose of methimazole, 2.5 mg daily, which she seems to tolerate very well. Latest TFTs on the above dose were normal. - Due to age and the fact that her TFTs are controlled on a very low dose of methimazole, we skipped further investigation for her thyrotoxicosis, assuming either mild Graves' disease (TSI antibodies were not elevated) or toxic adenoma. - We'll check her TSH, free T4, free T3 today - No need to add beta blockers at this time, since she is not tachycardic - I will see her back in 6 months, but may need labs sooner  Will need to call Dtr with results: [phone] [name]).  Appointment on 04/16/2017  Component Date Value Ref Range Status  . TSH 04/16/2017 2.55  0.35 - 4.50 uIU/mL Final  . T3, Free 04/16/2017 2.6  2.3 - 4.2 pg/mL Final  . Free T4 04/16/2017 0.75  0.60 - 1.60 ng/dL Final   Comment: Specimens from patients who are undergoing biotin therapy and /or ingesting biotin supplements may contain high levels of biotin.  The higher biotin concentration in these specimens interferes with this Free T4 assay.  Specimens that contain high levels  of biotin may cause false high results  for this Free T4 assay.  Please interpret results in light of the total clinical presentation of the patient.     Normal TFTs >> continue MMI 2.5  mg daily.  Carlus Pavlovristina Tanga Gloor, MD PhD [facility]

## 2017-04-16 NOTE — Patient Instructions (Addendum)
Please continue Methimazole 2.5 mg daily.  [facility]  Please come back for a follow-up appointment in 6 months.

## 2017-04-17 ENCOUNTER — Telehealth: Payer: Self-pay

## 2017-04-17 NOTE — Telephone Encounter (Signed)
-----   Message from Gwendolyn Pavlovristina Gherghe, MD sent at 04/16/2017  4:48 PM EDT ----- Raynelle FanningJulie, can you please call pt's daughter: TFTs are excellent! Continue current MMI dose, 2.5 mg daily.

## 2017-04-17 NOTE — Telephone Encounter (Signed)
Patient daughter returned phone call. Gave lab results. Patient daughter had no questions at this time.

## 2017-04-17 NOTE — Telephone Encounter (Signed)
[name] called to advise that the line dropped, called back, transferred to [place]ie.

## 2017-04-17 NOTE — Telephone Encounter (Signed)
Called and LVM advising patient's daughter to call back for lab results.

## 2017-04-17 NOTE — Telephone Encounter (Signed)
-----   Message from Cristina Gherghe, MD sent at 04/16/2017  4:48 PM EDT ----- Aydrian Halpin, can you please call pt's daughter: TFTs are excellent! Continue current MMI dose, 2.5 mg daily. 

## 2017-04-23 ENCOUNTER — Telehealth: Payer: Self-pay | Admitting: Pulmonary Disease

## 2017-04-23 NOTE — Telephone Encounter (Signed)
Spoke with Darlene at [facility], requesting we sign and fax back a FL2 form and care plan on pt.   I requested form to be refaxed.  Will await fax.

## 2017-04-24 NOTE — Telephone Encounter (Signed)
Papers placed on SN cart to be signed and these will be faxed once completed.

## 2017-04-24 NOTE — Telephone Encounter (Signed)
Forms signed by SN and faxed back today.

## 2017-05-05 ENCOUNTER — Encounter: Payer: Self-pay | Admitting: Pulmonary Disease

## 2017-05-06 ENCOUNTER — Ambulatory Visit: Payer: Medicare Other | Admitting: Pulmonary Disease

## 2017-05-06 ENCOUNTER — Encounter: Payer: Self-pay | Admitting: Pulmonary Disease

## 2017-05-06 ENCOUNTER — Ambulatory Visit (INDEPENDENT_AMBULATORY_CARE_PROVIDER_SITE_OTHER): Payer: Medicare Other | Admitting: Pulmonary Disease

## 2017-05-06 ENCOUNTER — Other Ambulatory Visit (INDEPENDENT_AMBULATORY_CARE_PROVIDER_SITE_OTHER): Payer: Medicare Other

## 2017-05-06 VITALS — BP 128/62 | HR 68 | Temp 97.6°F | Ht 67.5 in | Wt 181.4 lb

## 2017-05-06 DIAGNOSIS — I4510 Unspecified right bundle-branch block: Secondary | ICD-10-CM

## 2017-05-06 DIAGNOSIS — E559 Vitamin D deficiency, unspecified: Secondary | ICD-10-CM | POA: Diagnosis not present

## 2017-05-06 DIAGNOSIS — I10 Essential (primary) hypertension: Secondary | ICD-10-CM

## 2017-05-06 DIAGNOSIS — R609 Edema, unspecified: Secondary | ICD-10-CM

## 2017-05-06 DIAGNOSIS — I872 Venous insufficiency (chronic) (peripheral): Secondary | ICD-10-CM

## 2017-05-06 LAB — CBC WITH DIFFERENTIAL/PLATELET
BASOS ABS: 0 10*3/uL (ref 0.0–0.1)
Basophils Relative: 0.8 % (ref 0.0–3.0)
Eosinophils Absolute: 0.3 10*3/uL (ref 0.0–0.7)
Eosinophils Relative: 5.6 % — ABNORMAL HIGH (ref 0.0–5.0)
HCT: 40.4 % (ref 36.0–46.0)
HEMOGLOBIN: 13.4 g/dL (ref 12.0–15.0)
LYMPHS ABS: 1.6 10*3/uL (ref 0.7–4.0)
Lymphocytes Relative: 33.6 % (ref 12.0–46.0)
MCHC: 33.1 g/dL (ref 30.0–36.0)
MCV: 88.4 fl (ref 78.0–100.0)
MONO ABS: 0.6 10*3/uL (ref 0.1–1.0)
MONOS PCT: 11.4 % (ref 3.0–12.0)
NEUTROS PCT: 48.6 % (ref 43.0–77.0)
Neutro Abs: 2.4 10*3/uL (ref 1.4–7.7)
Platelets: 170 10*3/uL (ref 150.0–400.0)
RBC: 4.57 Mil/uL (ref 3.87–5.11)
RDW: 14 % (ref 11.5–15.5)
WBC: 4.9 10*3/uL (ref 4.0–10.5)

## 2017-05-06 LAB — COMPREHENSIVE METABOLIC PANEL
ALK PHOS: 85 U/L (ref 39–117)
ALT: 11 U/L (ref 0–35)
AST: 18 U/L (ref 0–37)
Albumin: 4 g/dL (ref 3.5–5.2)
BILIRUBIN TOTAL: 0.4 mg/dL (ref 0.2–1.2)
BUN: 25 mg/dL — AB (ref 6–23)
CO2: 32 mEq/L (ref 19–32)
CREATININE: 1.11 mg/dL (ref 0.40–1.20)
Calcium: 9.8 mg/dL (ref 8.4–10.5)
Chloride: 102 mEq/L (ref 96–112)
GFR: 48.84 mL/min — ABNORMAL LOW (ref >60.00)
GLUCOSE: 99 mg/dL (ref 70–99)
Potassium: 3.9 mEq/L (ref 3.5–5.1)
SODIUM: 139 meq/L (ref 135–145)
TOTAL PROTEIN: 6.9 g/dL (ref 6.0–8.3)

## 2017-05-06 LAB — VITAMIN D 25 HYDROXY (VIT D DEFICIENCY, FRACTURES): VITD: 61.93 ng/mL (ref 30.00–100.00)

## 2017-05-06 NOTE — Progress Notes (Signed)
Subjective:    Patient ID: [name], female    DOB: Nov 20, 1922, 83 y.o.   MRN: [id number]  HPI 83 y/o WF here for a follow up visit... she has multiple medical problems as noted below...  Followed for general medical purposes w/ hx chr obstructive asthma, severe episodic dyspnea from anxiety, HBP, RBBB, Hypercholesterolemia, borderline DM, DJD, LBP w/ sp stenosis, etc... ~  SEE PREV EPIC NOTES FOR THE OLDER DATA >>     CTAngio Chest 05/2009 showed no evid of PE, biapical pleuroparenchymal scarring otherw clear lungs, no adenopathy/ effusions/ etc...  CXR 3/15 showed norm heart size, clear lungs, elev of right hemidiaph, NAD...  LABS 3/15:  Chems- wnl;  CBC- wnl;  BNP=26...  LABS 5/15:  Chems- wnl x BS=120;  CBC- wnl;  BNP= 225    CXR 1/16 showed norm heart size, clear lungs, mild right diaph eventration- no change, Tspine DJD. DISH/ osteopenia; NAD...   2DEcho 1/16 showed norm LV size & function w/ EF=65-70%, AoV leaflets mildly thickened w/o AS, MV leaflets mod thickened w/ trivMR, mild RA dil, PAsys=77mHg...  LABS 1/16:  Chems- wnl w/ Cr=0.95;  BNP=66;  CBC- wnl w/ Hg=14.4..Marland KitchenMarland Kitchen CXR 4/16 in ER> norm heart size, atherosclerosis of Ao, no edema or consolidation in lungs, eventration of right hemidiaph- no change, DJD spine.  EKG 4/16 in ER> NSR, rate72, PACs, LAD, RBBB  LABS 4/16 in ER> Chems- wnl x K=3.0;  CBC- wnl;  Troponin=neg;  BNP=69   ~  July 21, 2015:  6wk ROV & add-on appt after 2 ER visits for dyspnea precipitated by anxiety> as prev noted- very difficult situation w/ 953y/o mother who insists on living alone, doing her own meds, etc but incapable of doing all that needs to be done & remembering her meds etc;  Daughter has done all she can do as there is alot of history in their relationship- every visit the daughter is the one who cries and mother is just angry;  They have a woman who stays with the pt at night but she won't allow help during the day;  Episodes  of dyspnea are ppt by panic attacks yet pt won't take benzos regularly & forgets to take them w/ these attacks- just calls the daugh & they freq wind up in the ER... Pt's son thinks her use of the rescue inhaler is an "upper" & causes problems, he wants her on oxygen instead & I reviewed Medicare guidelines- we will check ambulatory oxygen sat test today & sched ONO, otherw I offered to order Oxygen for them to self-pay but they decline... Pt flat out refused my rec for Klonopin Bid, she has Xanax 0.566mtabs which she uses sparingly, and given Ativan1m4mrom ER but says she doesn't have this med...      ER visit 07/15/15> c/o episode of SOB lasting 73m59mppt by sitting at home w/o AC &[facility] was very hot, Exam was neg w/ norm VS & O2sat=98% on RA, she had sl tender right chest wall, CXR showed norm heart size/ clear lungs/ NAD, EKG showed NSR/ rate88/ RBBB/ LAD, old infer scar/ no acute changes, LABS wnl & enz were neg... Symptoms resolved spont & she was disch home...      ER visit 06/12/15> another episode of SOB & no insight into cause but daugh indicates it was from anxiety/panic when she was home alone; Exam clear, VSS;  CXR was again wnl & Labs wnl as well; they gave  her Ativan to try but she hasn't used it; she was even given an aerochamber to use w/ her inhaler but she won't use that... EXAM reveals Afeb, VSS, O2sat=95% on RA;  HEENT- neg;  Chest- clear w/o w/r/r, sl tender on palp;  Heart- RR gr1/6 SEM no r/g;  Abd- soft, non-tender, neg;  Ext- VI, tr edema, no c/c;  Neuro- intact, walks w/ cane, anxious... We reviewed prob list, meds, xrays and labs> SHE DID NOT BRING MED BOTTLES OR LIST TO THE OV TODAY- reminded to do so for every visit!  CXR 7/24 & 07/15/15 showed norm heart size, tortuous Ao, clear lungs w/ mild elev of right hemidiaph, NAD.Marland KitchenMarland Kitchen  EKG 07/15/15 showed chronic changes- NSR/ rate88/ RBBB/ LAD/ old infer scar/ no acute abnormalities...  LABS 7-06/2015> Chems- wnl x BS=127-147;  BNP=55;   Troponins=neg;  CBC- wnl IMP/PLAN>>  I had another long talk w/ [name] & her daughter [name]; I have again rec that she use a low dose of the ALPRAZOLAM 0.47m tabs- 1/2 tab Tid regularly at breakfast, lunch, & dinner; she may also take an extra 1/2 tab prn anytime she feels anxious or panic setting in;  We have suggested similar plan many times in the past & for whatever reason she will not do it!  I have offered Psyche referral or to set her up w/ a counselor but this too is declined;  SEnid Derryis having a very hard time w/ her mother's attitude, lack of cooperation, mild dementia at age 83 etc...   ~  October 24, 2015:  328moOV & [name] appears to be stable- mult chr complaints but nothing new & doing reasonably well;  She has moved to [facility]/[place] & has a siActuaryired by the family ([place]and this arrangement seems to be helping (pt likes RoGordon they get along but pt never misses an opportunity to "stick-it" to her daugh or demean her in some way)... We reviewed the following medical problems during today's office visit >>     Chr Obstructive Asthma> her dyspnea is from anxiety/panic not asthma; on Advair100Bid ([facility] requires regular Rx directions) & Proventil rescue prn; notes breathing back to baseline & prn Klonopin helps dyspnea...    HBP> on ASA81, Norvasc5, off Lasix20, K10-2/d; BP=140/76, tol meds well; denies CP, palpit, ch in SOB, tr edema...    CHOL> on diet alone, refuses meds, FLP 3/14 shows TChol 177, TG 50, HDL 60, LDL 107    DM> on diet alone, wt up sl at 179#, BS in epic=94-127, last A1c (2/13) was 6.3 & she knows to restrict carbs etc...    GI- Reflux, Divertics, constip> prev on Protonix40, now on Zantac150, Miralax; continue same meds.. [name]  DJD/ LBP> prev on Pred10, OTC analgesics prn & osteobiflex prn; had right THR 2011; known sp stenosis w/ prev ESI... We reviewed prob list, meds, xrays and labs> she had the 2016 Flu vaccine... IMP/PLAN>>  Stable overall,  continue current meds, rec to increase exercise & decr intake (work on wt reduction);  We plan ROV recheck w/ blood work in 3 mo...  ~  December 29, 2015:  72m23moV & Gwendolyn Sjogrend her daugh continue to be locked in battle regarding her care (resides at [facility] & has daytime sitter RosAltonom she loves); she persists w/ mult somatic complaints- SOB, irritable, throbbing in her legs, etc;  She saw TP 11/22/15 w/ CC insomnia & fatigue, and her Thyroid function had  not been checked for awhile=> labs showed TSH=0.05 and FreeT4=1.70 (prev TSH was 1.93 in 2014); she was referred to DrGherghe & seen 12/01/15> exam of neck was neg, TFT labs proved thyrotoxicosis, TSI was neg as was a Sed rate (14); they chose to try low dose Methimazole rx (62m/d) as opposed to scan/further testing; she has f/u appt w/ DrGherghe pending...     Breathing is stable on Advair100Bid, plus Flonase,Zyrtek, AlbutHFA prn; she continues to c/o SOB/ DOE...    BP is controlled on Amlod5, Demadex20, K10Bid; BP= 116/58 & she denies CP, palpit, ch in edema, etc...    GI- controlled on Zantac150, Miralax...    She remains on Xanax0.25Bid & Desyrel50Qhs prn sleep (she is out of Zoloft)... EXAM reveals Afeb, VSS, O2sat=95% on RA;  HEENT- neg;  Chest- clear w/o w/r/r;  Heart- RR gr1/6 SEM no r/g;  Abd- soft, non-tender, neg;  Ext- VI, tr edema, no c/c;  Neuro- intact, walks w/ cane, anxious...  LABS 11/2015 in Epic>  TSH=0.05 (confirmed), FreeT4=1.70, FreeT3=4.0, Thy Stim Ig was wnl;  Sed=14...  LABS 12/29/15> [name] insisted on recheck CBC & Iron level>  CBC- wnl w/ Hg=14.6, Fe=99 (31%sat);  Chems- abn w/ Na=130, TCO=39, Cr=1.2 IMP/PLAN>>  We checked the above labs, reminded to use Tylenol prn, no salt, elevate, etc; we decided to decr the Demadex to 1/2 tab Qam & ADD [id number] tab in the afternoon; she is reassured about her health & asked to f/u w/ DrGherghe for Endocrine...  ~  February 09, 2016:  6wk ROV & today is [name]'s 89st birthday!  DIredell notes that pt is better on the Tapazole- less anxious, mood swings diminished, sleeping better, etc; she notes that edema is increased (on Demadex20-1/2 & Diamox250)- reminded to elim sodium etc... We reviewed the following medical problems during today's office visit >>     Chr Obstructive Asthma> her dyspnea is from anxiety/panic not asthma; on Advair100Bid ([facility] requires regular Rx directions) & Proventil rescue prn; notes breathing back to baseline & Alp[raz0.25 helps dyspnea...    HBP> on ASA81, Norvasc5, Demadex20-1/2, Diamox250, K10-2/d; BP=140/70, tol meds well; denies CP, palpit, ch in SOB, but incr edema noted recently...    CHOL> on diet alone, refuses meds, last FLP 3/14 shows TChol 177, TG 50, HDL 60, LDL 107    DM> on diet alone, wt down to 166#, BS in epic=100-150, last A1c (2/13) was 6.3 & she knows to restrict carbs etc...    Thyrotoxicosis> on Tapazole5 per DrGherghe; f/u TFTs 01/10/16 showed TSH=2.39 and she is clinically improved...    GI- Reflux, Divertics, constip> prev on Protonix40, now on Zantac150, Miralax; continue same meds..[name]   DJD/ LBP> prev on Pred10, OTC analgesics prn & osteobiflex prn; had right THR 2011; known sp stenosis w/ prev ESI...    Anxiety, mild senile dementia, difficult interaction betw pt & daugh> on Xanax0.25Bid & Zoloft50 Qhs EXAM reveals Afeb, VSS, O2sat=93% on RA;  HEENT- neg, no thyroid nodule palp;  Chest- clear w/o w/r/r;  Heart- RR gr1/6 SEM no r/g;  Abd- soft, non-tender, neg;  Ext- VI, w/1+ edema, no c/c;  Neuro- intact, walks w/ cane, anxious...  CXR 01/11/16 showed borderline cardiomeg, elev right hemidiaph, clear lungs, DJD in Tspine...  LABS 12/2015>  Chems- ok w/ BS=152;  CBC- ok w/ Hg=14.1;  TSH=2.39 on Tapazole5... IMP/PLAN>>  We reviewed need for no salt, elev legs, wear support hose; decided to incr Demedex20 Qam & continue the Diamox250/d;  we plan ROV recheck in 644mo..  ~  May 21, 2016:  339moOV & post Hosp check>  Since she was  last here MaLevy [name] had several follow up visits w/ specialists [facility]    She saw DrGherghe 03/01/16>  Throtoxicosis on  Tapazole 64m61mod; they opted not to pursue diagnostic eval (adenoma vs Graves);  TSH=0.03 11/2015 7 improved to 2.39 on 01/10/16...    Seen by DrMayer- Podiatry 04/19/16> bilat big toenails, onychomycosis w/ debridement performed...     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    Above prob list reviewed... EXAM reveals Afeb, VSS, O2sat=95% on RA;  Wt= 175#;  HEENT- neg, no thyroid nodule palp;  Chest- clear w/o w/r/r;  Heart- RR gr1/6 SEM no r/g;  Abd- soft, non-tender, neg;  Ext- VI, w/1+ edema, no c/c;  Neuro- intact, walks w/ cane, anxious...  CXR 04/2016>  Aortic atherosclerosis, elev right hemidiaph, clear lungs, DDD...   CT Head 04/23/16>  Mucosal membrane thickening 7 sm amt fluid in left max sinus, otherw unremarkable- no ischemia or infarct...  CT Chest 04/23/16>  Atherosclerotic Ao & coronaries, no adenopathy, sm thyroid nodules noted no change from 2010, elev right hemidiaph & some basilar atx, DJD in spine w/ spurs & 40% T11 compression  Ba Esophagram 04/30/16>  Mod to severe esoph dysmotility noted, no mass or stricture...  LABS 04/2016>  Chems- ok x Cr=1.3-1.6, BS=100-140;  CBC- wnl;  Thyroid- ok on Tapazole... IMP/PLAN>>  MarHendel  improved & back to baseline; she knows to take her time & be careful eating;  Continue same meds; incr activity level; ROV in 4-6 weeks...   ~  July 16, 2016:  44mo61mo & Shea has settled into [facility], still has RosaHartfordping out part time, "I'm doing pretty good", says she's eating slowly/ small bites/ but whatever she wants, better overall on the Xanax0.25Bid;  From the pulm perspective she's using the Advair100Bid & NEB w/ albut vs Proair as needed but she hasn't needed eitherr ecently... Prev w/ HCO3=39 & Cr=1.2 so we adjusted her diuretics- from Demadex20 to 1/2 of that + Diamox250/d & improved...    DrGherghe rechecked her TFTs and they are euthyroid on Tapazole 64mg 4m...    DaughWhitsettests that we recheck her VitD level on her 2000u daily supplement. EXAM reveals Afeb, VSS, O2sat=96% on RA;  Wt= 177#;  HEENT- neg, no thyroid nodule palp;  Chest- clear w/o w/r/r;  Heart- RR gr1/6 SEM no r/g;  Abd- soft, non-tender, neg;  Ext- VI, w/ tr edema, no c/c;  Neuro- gait abn otherw neg.  LABS 07/16/16>  Chems- wnl w/ HCO3=34, Cr=1.18;  VitD=56... IMP/PLAN>>  Labs look great on current meds> continue Demadex20- 1/2 tab +  Diamox250; continue VitD 2000u supplement; be suere to incr activity at [facility]! We plan recheck 51mo..   ~  December 04, 2016:  4-549moOV & general medical f/u visit>  MaAvinaells me that she loves [facility]- friends, the food "we have ice cream every day";  She reports feeling well & denies new complaints or concerns; we reviewed the following medical problems during today's office visit >>     Chr Obstructive Asthma> her dyspnea is more from anxiety/panic & not asthma; on Advair100Bid ([facility] requires regular Rx directions) & Proventil rescue prn; notes that Alpraz0.25 helps dyspnea...    HBP> on ASA81, Norvasc5, Demadex20-1/2, Diamox250; BP=120/62, tol meds well; denies CP, palpit, ch in SOB, but incr edema noted recently => needs low sodium diet, incr Demadex20  & change diamox to 4PM daily...    CHOL> on diet alone, refuses meds, last FLP 3/14 shows TChol 177, TG 50, HDL 60, LDL 107    DM> on diet alone, wt up to 189#, BS in epic=100-150, last A1c (2/13) was 6.3 & she knows to restrict carbs etc...    Thyrotoxicosis> on Tapazole5-1/2 tab daily per DrGherghe; f/u TFTs 10/02/16 showed TSH=1.78 and FreeT4=0.70, she is clinically improved...    GI- Reflux, Divertics, constip> prev on Protonix40, now on Zantac150, Miralax; continue same meds.. [name]  DJD/ LBP> on OTC analgesics prn & osteobiflex prn; had right THR 2011; known sp stenosis w/ prev ESI...    Anxiety, mild senile dementia, difficult interaction betw pt & daugh> on Xanax0.25Bid & Zoloft50 Qhs EXAM reveals Afeb, VSS, O2sat=93% on RA;  Wt= up 12# to 189#;  HEENT- neg, no thyroid nodule palp;  Chest- clear w/o w/r/r;  Heart- RR gr1/6 SEM no r/g;  Abd- soft, non-tender, neg;  Ext- VI, w/ 1+ edema, no c/c;  Neuro- gait abn otherw neg.  LABS 12/04/16>  BMet- ok w/ HCO3=34, K=4.0, Cr=1.27...  IMP/PLAN>>  OK 2017 Flu shot today;  She is reminded to eliminate sodium/ salt from her diet, increase Demadex20Qam & take the Diamox250 Qd at 4PM;  [facility]...    ~  May 06, 2017:  27m77moV & general medical f/u visit>  [name] appears stable overall, she continues to thrive at [facility] [name] had knee surg & will not be returning; family notes some dental issues w/ 3 teeth removed by oral surg & partial plate being made; pt ambulates w/ a walker... We reviewed the following medical problems during today's office visit >>     Chr Obstructive Asthma> her dyspnea is more from anxiety/panic & not asthma; on Advair100Bid ([facility] requires regular Rx directions) & Proventil rescue prn; notes that Alpraz0.25Bid helps dyspnea...    HBP> on ASA81, Norvasc5, Demadex20-1/2, Diamox250; BP=120/62, tol meds well; denies CP, palpit, ch in SOB, but  incr edema noted recently=> needs low sodium diet, incr Demadex20 & change Diamox to 4PM daily...    CHOL> on diet alone, refuses meds, last FLP 3/14 shows TChol 177, TG 50, HDL 60, LDL 107    DM> on diet alone, wt down to 182#, BS in epic=99-150, last A1c (2/13) was 6.3 & she knows to restrict carbs etc...    Thyrotoxicosis> on Tapazole27mg67m2 tab daily per DrGherghe; f/u TFTs 5/18 showed TSH=2.55 and FreeT4=0.75, she is clinically improved & stable- continue same...    GI- Reflux, Divertics, constip> prev on Protonix40, now on Zantac150,  Miralax; continue same meds.[name]    DJD/ LBP> on OTC analgesics (Tylenol) prn & osteobiflex prn; had right THR 2011; known sp stenosis w/ prev ESI...    Anxiety, mild senile dementia, difficult interaction betw pt & daugh> on Xanax0.25Bid & Zoloft50 Qhs EXAM reveals Afeb, VSS, O2sat=93% on RA;  Wt= down 7# to 182#;  HEENT- neg, no thyroid nodule palp;  Chest- clear w/o w/r/r;  Heart- RR gr1/6 SEM no r/g;  Abd- soft, non-tender, neg;  Ext- VI, w/ 1+ edema, no c/c;  Neuro- gait abn otherw neg.  LABS 05/06/17>  Chems- ok w/ K=3.9, BS=99, Cr=1.11, LFTs wnl;  CBC- ok w/ Hg=13.4;  VitD=62... NOTE- TSH in May was 2.55... IMP/PLAN>>  [name] is stable- rec to continue diet/ exercise/ participation at [facility]; we reviewed mewds/ treatments/ etc- continue same...          Problem List:       DYSPNEA (ICD-786.05) - long hx of chronic obstructive asthma treated w/ ADVAIR100Bid & PROAIR (she uses them Prn now)... she is a non-smoker w/ some reactive airways disease in the past & retired from [employer] after 33 years in 1991... she denies cough, sputum, hemoptysis, worsening dyspnea, wheezing, chest pains, snoring, daytime hypersomnolence, etc...  ~  baseline CXR w/o acute changes...  ~  PFT's 5/02 w/ FVC 2.07 (68%), FEV1=1.36 (57%), and FEV1/FVC ratio=66%, mid-flows 42%... ~  CT Angio 7/10 was neg- x biapical pleuroparenchymal scarring... ~  CXR 10/11 showed sl elev right  hemidaiph, mild DJD sp, osteopenia, NAD.[name]. ~  CXR 6/12 showed mild apical scarring, clear & NAD, DJD sp w/ osteophytes... ~  Intermittent dyspnea more related to anxiety & treated w/ KLONOPIN 0.76m 1/2 to 1 tab Bid==> improved. ~  CXR 4/13 showed normal heart size, clear lungs, DJD in TSpine... ~  CXR 2/14 showed normal heart size, clear lungs w/ sl peribronch thickening, DJD in spine, NAD..[name] ~  CXR 3/15 showed norm heart size, clear lungs, elev of right hemidiaph, NAD... ~  She is encouraged to take the Klonopin 0.527mBid regularly- consider taking 1/2 in AM, 1/2 in afternoon, one at bedtime... ~  1/16: she continues w/ mult somatic complaints and intermittent choking episodes etc; she refuses to take the Advair or Klonopin... ~  CXR 1/16 showed norm heart size, clear lungs, mild right diaph eventration- no change, Tspine DJD. DISH/ osteopenia; NAD... ~  3/16: she is stable on current meds, asked to incr her non-existent exercise program... ~  4/16: went to ER w/ SOB- nothing found x mild edema; given Lasix40, potassium was low, thought to be anxious; in office f/u her daugh confirms anxiety & pt not taking her meds regularly; asked to take meds every day & try the Klonopin... ~  7/16: same thing- went to ER w/ dyspnea, nothing found & given Ativan but she wouldn't take it... ~  8/16: another episode w/ ER eval and symptoms resolved spontaneously on there own... ~  9/16: pt is advised (again) to start regular dosing of Alpraz 0.22m10m 1/2 tab Tid w/ extra 1/2 tab as needed for nerves... ~  12/16: she remains on Advair100-2spBid & Alpraz0.222m44m=> improved... ~  3/17: dyspnea further improved w/ Tapazole22mg/28mx for hyperthyroidism... ~  8/17:  She is stable on meds at [facility]- Advair100Bid, Albut prn, diuretics, Alprazolam, Tapazole, etc...  HYPERTENSION (ICD-401.9) - controlled on NORVASC 22mg d18my & HCTZ 222mgta37mily...  ~  2/13:  BP 144/70 today> tol rx well & denies HA, visual  changes,  CP, palipit, dizziness, syncope, edema, etc... ~  4/13:  BP= 148/80 & she denies CP, palpit, edema; dyspnea improved w/ Klonopin. ~  6/13:  BP= 132/68 & as noted she has mult somatic complaints... ~  3/14:  on Norvasc10, HCTZ25; BP=136/60, tol meds well; denies CP, palpit, ch in SOB, edema, etc  ~  9/14:  BP controlled on Amlod5 & Hct25-1/2 daily; BP= 130/70 & she denies CP, palpit, dizzy, SOB, edema, etc. ~  3/15: on ASA81, Norvasc5, Lasix20- 1-2/d; BP=136/70, tol meds well; denies CP, palpit, ch in SOB, but notes persist edema in legs; Rec to ch Amlod5 to Losar50, low sodium, elev legs, Lasix40. ~  5/15: on Losar50, Lasix40;  BP= 138/60 & she denies angina pain, palpit, ch in SOB, etc... ~  7/15: on Losar50, she stopped Lasix; BP= 138/78 and they want to go back on Amlod5 + HCT12.5 daily... ~  9/15: on Amlod5, Hct12.5; BP= 128/80 & she likes this combo better- denies CP, palpit, etc... ~  1/16: on Amlod5, Hct12.5 but ?what she is taking; BP= 130/80 & she has refused the ARBs... ~  3/16: on Amlod5, Hct12.5; BP= 142/80 & she has gained 9# w/ 1-2+ edema; discussed low sodium & change Hct to LASIX20 Qam... ~  5/16: on Amlod5, Lasix20, K20; BP=148/70, recent K=3.0 & daugh notes she's not taking meds regularly; asked to take meds everyday & incr K20Bid... ~  2/17: BP is controlled on Amlod5, Demadex20, K10Bid; BP= 116/58 & she denies CP, palpit, ch in edema, etc... ~  8/17: BP is controlled on Amlod5 + Demadex20-1/2 & diamox250/d;  BP= 128/70 7 rec to continue same...  RIGHT BUNDLE BRANCH BLOCK (ICD-426.4) - on ASA 28m/d... baseline EKG w/ RBBB and 2DEcho 5/02 showed mild asymmetric LVH w/ incr EF... ~  12/15: she had Cards eval by DrNishan> he rec ARB/diuretic but she refused to switch from her Amlod5/ Hct12.5 regimen; she refused EKG due to "allergy to the electrodes"... ~  2DEcho 1/16 showed norm LV size & function w/ EF=65-70%, AoV leaflets mildly thickened w/o AS, MV leaflets mod thickened w/  trivMR, mild RA dil, PAsys=349mg... ~  EKG 4/16 showed NSR, rate72, PACs, LAD, RBBB  VENOUS INSUFFIC & EDEMA >>  ~  3/15: she was switched off Amlod & onto Losar50, Lasix20=>40, plus no salt, elevation, support hose, etc...  ~  [facility]: [facility]; she has VI & 1+edema but stable, no acute changes... 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 ~  3/16: she remains on the Pred from Derm, now w/ incr edema & 9# wt gain, rec no salt & change Hct to LASIX20/d... ~  5/16: improved on Lasix20 w/ edema decr 7 wt down several lbs... ~  12/16: on ASA81, Norvasc5, off Lasix20, K10-2/d; BP=140/76, tol meds well; denies CP, palpit, ch in SOB, tr edema. ~  3/17: edema increased w/ decr in diuretics and [facility] to incr Demadex20/d + Diamox250/d and K10Bid...  HYPERLIPIDEMIA (ICD-272.4) - on diet alone... she forgets to come to visits FASTING for this blood work ~  FLSilkworth/07 showed TChol 205, TG 71, HDL 49, LDL 130... ~  FLP 6/12 on diet alone showed TChol 218, TG 50, HDL 66, LDL 129 ~  FLP 2/13 on diet alone showed TChol 171, TG 43, HDL 66, LDL 97 ~  FLP 3/14 on diet alone showed TChol 177,  TG 50, HDL 60, LDL 107 ~  She needs to ret FASTING for f/u FLP...  DIABETES MELLITUS, BORDERLINE (ICD-790.29) - on diet alone w/ prev BS's in the 100-160 range... ~  labs in 2008-9 showed BS= 101 to 108 ~  labs 1/10 showed BS= 106, A1c= 5.9 ~  Labs 6/12 showed BS= 93, A1c= 6.6.Marland KitchenMarland Kitchen rec diet, exercise... ~  Labs 2/13 showed BS= 92, A1c= 6.3 ~  3/14: on diet alone, wt stable ~174#, BS=97, last A1c (2/13) was 6.3 & she knows to restrict carbs etc. ~  Labs 3/15 showed BS= 95; and BS= 120 in [id number]...  ~  Labs 2016 showed BS= 101-135 on diet alone... ~  Labs in 2017 showed BS= 100-150 range on diet alone  THYROTOXICOSIS >> routine labs 11/2015 showed thyrotoxicosis & referred to Endocrine, DrGherghe- Rx w/ Tapazole56m/d &  she is clinically improved... ~  Labs 2/13 showed TSH= 2.86 ~  Labs 3/14 showed TSH=1.93 ~  Labs 1/17 showed TSH=0.03-0.09;  FreeT3=4.0;  FreeT4=1.70 => referred to DrGherghe, started on Tapazole5 ~  Labs 2/17 showed TSH=2.93;  FreeT3=2.5;  FreeT4=0.76 ~  She continues to f/u w/ drGherghe for thyroid...  INDIGESTION/ REFLUX SYMPTOMS >> see 10/12 note & PROTONIX 415md started, further eval if symptoms persist... ESOPHAGEAL DYSMOTILITY/ PRESBYESOPHAGUS >>  ~  4/13:  She notes some reflux symptoms and excess gas w/ belching; rec to take the Protonix daily & Simethacone vs Tums which she says helps her gas. ~  5/13:  She saw GI DrPerry w/ rec to take Prilosec for her indigestion... ~  5/15:  She presented w/ worsening indigestion, dysphagia, reflux & Prev30 was incr to Bid w/ GI f/u suggested for EGD... 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 ~  9/15:  Symptoms persist but she is not regurg or vomiting, and weight stable; they want to change to [id number]-OK, and rec proceed w/ MBS by speech path... ~  She continues to have intermit choking episodes and c/o phlegm in her throat; she has tried "everything" & encouraged to f/u w/ GI- DrPerry/ DrJEdwards... ~  She stopped the PPI rx in favor of Zantac150...  DIVERTICULOSIS OF COLON (ICD-562.10) - she takes SENAKOT-S, MIRALAX, Peppermint Tea, & sauerkraut Prn...last colonoscopy 9/02 by DrPerry was WNL...  PYELONEPHRITIS (ICD-590.80) - SEE 1/09 Hospitalization (reviewed)... ~  She saw DrMacDiarmid for her recurrent UTIs, chronic cystitis, urge & stress incont, nocturia; she is INTOL to [facility].  Hx of BREAST CYST  (ICD-610.0)  DEGENERATIVE JOINT DISEASE (ICD-715.90) - s/p right hip hemiarthroplasty 11/09 by DrAplington w/ wound complic... then dx w/ loosening of the femoral shaft & had conversion to right THR by DrAlusio 10/11 & much improved... she uses CELEBREX 20061mrn (seldom takes this).  LOW BACK PAIN SYNDROME (ICD-724.2) & SPINAL STENOSIS (ICD-724.00) - severe LBP & spinal stenosis w/ evals by DrRamos & DrNudelman... s/p shots, considering poss surgery vs alternative therapies... she takes Celebrex, Osteobiflex, MVI, Vit D... ~  8/10: eval by DrAplington- diff leg lengths, lift placed in right shoe, then trial Lyrica50m51m ~  12/11:  improved after hip revision surg (to THR) 10/11 w/ better ambulaton... ~  5/13:  DrRamos gave her another ESI for her leg pain related to sp stenosis... ~  3/14:  C/o neuropathic discomfort in legs- eval by Ramos & offered shots in her back; try Lyrica50 in the interim... ~  Persistent leg pain, on OTC "natural" meds she says; encouraged to f/u w/ DrRamos et al...  Hx of ANEMIA (ICD-285.9) - eval by GI in 2002 showed normal EGD and Colon... prob iron malabsorption problem Rx'd w/ Fe infusion... ~  labs 1/10 showed Hg= 14.6, MCV= 89, Fe= 94 ~  labs 12/11 showed Hg= 12.8, MCV= 90, Fe= 33... try Fe supplement + VitC... ~  Labs 6/12 showed Hg= 15.0 ~  Labs 2/13 showed Hg= 14.6 ~  Labs 2/14 showed Hg= 14.8 ~  Labs 3/15 showed Hg= 14.9 ~  Labs 1/16 showed Hg= 14.4 ~  Labs 4/16 showed Hg= 13.7 ~  Labs 2/17 showed Hg= 14.1  DERM:  rash Rx'd by dermatology- OLUX-E foam= clobetasol Foam 0.05%... 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 ~  5/16: she is still on Pred per Derm- currently 31m/d...    Past Surgical History:  Procedure Laterality Date  . CATARACT EXTRACTION    . conversion to right THR    . right hip hemiarthroplasty     total    Outpatient Encounter Prescriptions as of 05/06/2017   Medication Sig  . acetaminophen (TYLENOL) 325 MG tablet Take 650 mg by mouth every 4 (four) hours as needed for mild pain.  .Marland KitchenacetaZOLAMIDE (DIAMOX) 250 MG tablet TAKE (1) TABLET BY MOUTH EACH MORNING.  . ALPRAZolam (XANAX) 0.25 MG tablet Take 1 tablet (0.25 mg total) by mouth 2 (two) times daily.  .Marland KitchenamLODipine (NORVASC) 5 MG tablet TAKE ONE TABLET BY MOUTH ONCE DAILY.  .Marland KitchenASPIRIN LOW DOSE 81 MG EC tablet TAKE ONE TABLET BY MOUTH ONCE DAILY.  . cetirizine (ZYRTEC) 10 MG tablet TAKE 1 TABLET BY MOUTH ONCE DAILY FOR ALLERGIES. (Patient taking differently: TAKE 10 MG BY MOUTH ONCE DAILY FOR ALLERGIES.)  . Cholecalciferol (VITAMIN D3) 2000 units capsule Take 1 capsule by mouth once daily  . doxycycline (VIBRA-TABS) 100 MG tablet Take 1 tablet (100 mg total) by mouth 2 (two) times daily.  . fluticasone (FLONASE) 50 MCG/ACT nasal spray SPRAY 2 SPRAYS INTO EACH NOSTRIL ONCE DAILY.  .Marland KitchenFluticasone-Salmeterol (ADVAIR DISKUS) 100-50 MCG/DOSE AEPB Inhale 1 puff into the lungs 2 (two) times daily.  .Marland Kitchenguaifenesin (ROBITUSSIN) 100 MG/5ML syrup Take 200 mg by mouth 3 (three) times daily as needed for cough.  .Marland KitchenHYDROcodone-homatropine (HYCODAN) 5-1.5 MG/5ML syrup Take 5 mLs by mouth every 6 (six) hours as needed for cough.  .Marland Kitchenipratropium-albuterol (DUONEB) 0.5-2.5 (3) MG/3ML SOLN Take 3 mLs by nebulization every 4 (four) hours as needed.  . methimazole (TAPAZOLE) 5 MG tablet Take 2.575mevery other day.  . Multiple Vitamin (DAILY-VITE) TABS TAKE ONE TABLET BY MOUTH ONCE DAILY.  . Marland Kitchenndansetron (ZOFRAN) 4 MG tablet Take 1 tablet (4 mg total) by mouth every 6 (six) hours as needed for nausea or vomiting.  . polyethylene glycol powder (GLYCOLAX/MIRALAX) powder MIX 1 CAPFUL (17G) IN 8 OUNCES OF JUICE/WATER AND DRINK ONCE DAILY ASNEEDED FOR CONSTIPATION.  . Marland KitchenROAIR HFA 108 (90 Base) MCG/ACT inhaler USE 1 TO 2 PUFFS EVERY SIX HOURS AS NEEDED.  . ranitidine (ZANTAC) 150 MG tablet TAKE ONE TABLET BY MOUTH ONCE DAILY.  . Marland Kitchensertraline (ZOLOFT) 50 MG tablet TAKE (1) TABLET BY  MOUTH AT BEDTIME.  [name] Spacer/Aero-Holding Chambers (AEROCHAMBER PLUS WITH MASK) inhaler Use as instructed  . torsemide (DEMADEX) 20 MG tablet TAKE 1/2 TABLET BY MOUTH EVERY MORNING. (Patient taking differently: TAKE 20 MG BY MOUTH EVERY MORNING.)   No facility-administered encounter medications on file as of 05/06/2017.     Allergies  Allergen Reactions  . Azithromycin Shortness Of Breath  . Ciprofloxacin Other (See Comments)     hallucinations  . Levofloxacin Other (See Comments)    Insomnia, indigestion, tingling sensation in legs  . Furosemide Rash    Bullous pemphigoid  . Latex Rash  . Other Rash    EKG leads caused a rash that required steroids to clear    Current Medications, Allergies, Past Medical History, Past Surgical History, Family History, and Social History were reviewed in [employer] record.    Review of Systems         See HPI - all other systems neg except as noted... The patient complains of decreased hearing, dyspnea on exertion, muscle weakness, and difficulty walking.  The patient denies anorexia, fever, weight loss, weight gain, vision loss, hoarseness, chest pain, syncope, peripheral edema, prolonged cough, headaches, hemoptysis, abdominal pain, melena, hematochezia, severe indigestion/heartburn, hematuria, incontinence, suspicious skin lesions, transient blindness, depression, unusual weight change, abnormal bleeding, enlarged lymph nodes, and angioedema.     Objective:   Physical Exam     WD, WN, Chr ill appearing 83 y/o WF in NAD... GENERAL:  Alert & oriented; pleasant & cooperative... HEENT:  [place]/AT, EOM-full, EACs-clear, TMs-wnl, NOSE-clear, THROAT-clear & wnl. NECK:  Supple w/ fairROM; no JVD; normal carotid impulses w/o bruits; no thyromegaly or nodules palpated; no lymphadenopathy. CHEST:  Clear to P & A; without wheezes/ rales/ or rhonchi heard... HEART:  Regular Rhythm;  without murmurs/ rubs/ or gallops detected... ABDOMEN:  Soft & nontender; normal bowel sounds; no organomegaly or masses palpated... EXT:  mod arthritic changes, walks w/ cane, +venous insuffic & incr 1-2+ edema., scattered varicose veins... NEURO:  CN's intact; motor testing normal; no focal deficits... DERM:   mild intertrig rash under breast, & onychomycosis of toenails...  RADIOLOGY DATA:  Reviewed in the EPIC EMR & discussed w/ the patient...  LABORATORY DATA:  Reviewed in the EPIC EMR & discussed w/ the patient...   Assessment & Plan:    05/06/17>  [name] is stable- rec to continue diet/ exercise/ participation at [facility]; we reviewed mewds/ treatments/ etc- continue same   Hx episodes of SOB>> see above, she has been resistent to trying any of the meds & treatment suggestions that we have given to her (I know she would improve on Benzo rx)...  12/16> symptoms are better w/ Advair100-2spBid & Alprazolam 0.27mBid admin regularly at [facility]... 12/29/15> we checked the above labs, reminded to use Tylenol prn, no salt, elevate, etc; we decided to decr the Demadex to 1/2 tab Qam & ADD [id number] tab in the afternoon; she is reassured about her health & asked to f/u w/ DrGherghe for Endocrine re thyroid... 07/16/16>   She is stable w/o recurrent dyspnea spells  Hx Asthma> stable on Advair100 & Proair prn; hx anxiety component on Klonopin in past but she is not using! Now on Alpraz & improved.  HBP>  Controlled on Amlod5 + Demadex + Diamox, & off KCl; reminded to take meds regularly & no salt...  RBBB>  Aware & denies CP, palpit, ch in DOE, etc... she thinks that she is allergic to EKG electrodes; EKG  4./16 in ER showed NSR, rate72, PACs, LAD, RBBB  CHOL>  On diet alone & FLP looks reasonable;  We reviewed low chol, low fat diet...  Borderline DM>  BS= 101-135 & last A1c is 6.3;  on diet alone & we reviewed low carb no sweets etc...  Thyrotoxicosis>  Improved on Tapazole5 per  DrGherghe => down to 1/2 tab daily...  GI> Indigestion, Divertics> she notes most bowel symptoms resolved off spicey foods;  UGI symptoms w/ dysphagia, food sticking, belch/gas/ etc have not resolved on Protonix40Bid and after GI consult w/ DrPerry; we reviewed her UGI series results and decided to proceed w/ MBS by Speech Path; transiently improved then sought 2nd opinion from DrJEdwards=> on Nexium40Bid...  UTI>  Klebsiella UTI resolved after Septra Rx... She has been eval by DrMacDiarmid.  DJD, LBP, Spinal Stenosis>  Prev evals by Ortho, DrRamos, DrNudelman etc; improved after THR w/ better ambulation; c/o neuropathic discomfort in legs- she will f/u w/ Ramos for shots, using OTC "natural" meds...  Anxiety>  If she would take the Alpraz, I feel it would help...   Patient's Medications  New Prescriptions   No medications on file  Previous Medications   ACETAMINOPHEN (TYLENOL) 325 MG TABLET    Take 650 mg by mouth every 4 (four) hours as needed for mild pain.   ACETAZOLAMIDE (DIAMOX) 250 MG TABLET    TAKE (1) TABLET BY MOUTH EACH MORNING.   ALPRAZOLAM (XANAX) 0.25 MG TABLET    Take 1 tablet (0.25 mg total) by mouth 2 (two) times daily.   AMLODIPINE (NORVASC) 5 MG TABLET    TAKE ONE TABLET BY MOUTH ONCE DAILY.   ASPIRIN LOW DOSE 81 MG EC TABLET    TAKE ONE TABLET BY MOUTH ONCE DAILY.   CETIRIZINE (ZYRTEC) 10 MG TABLET    TAKE 1 TABLET BY MOUTH ONCE DAILY FOR ALLERGIES.   CHOLECALCIFEROL (VITAMIN D3) 2000 UNITS CAPSULE    Take 1 capsule by mouth once daily   DOXYCYCLINE (VIBRA-TABS) 100 MG TABLET    Take 1 tablet (100 mg total) by mouth 2 (two) times daily.   FLUTICASONE (FLONASE) 50 MCG/ACT NASAL SPRAY    SPRAY 2 SPRAYS INTO EACH NOSTRIL ONCE DAILY.   FLUTICASONE-SALMETEROL (ADVAIR DISKUS) 100-50 MCG/DOSE AEPB    Inhale 1 puff into the lungs 2 (two) times daily.   GUAIFENESIN (ROBITUSSIN) 100 MG/5ML SYRUP    Take 200 mg by mouth 3 (three) times daily as needed for cough.    HYDROCODONE-HOMATROPINE (HYCODAN) 5-1.5 MG/5ML SYRUP    Take 5 mLs by mouth every 6 (six) hours as needed for cough.   IPRATROPIUM-ALBUTEROL (DUONEB) 0.5-2.5 (3) MG/3ML SOLN    Take 3 mLs by nebulization every 4 (four) hours as needed.   METHIMAZOLE (TAPAZOLE) 5 MG TABLET    Take 2.31m every other day.   MULTIPLE VITAMIN (DAILY-VITE) TABS    TAKE ONE TABLET BY MOUTH ONCE DAILY.   ONDANSETRON (ZOFRAN) 4 MG TABLET    Take 1 tablet (4 mg total) by mouth every 6 (six) hours as needed for nausea or vomiting.   POLYETHYLENE GLYCOL POWDER (GLYCOLAX/MIRALAX) POWDER    MIX 1 CAPFUL (17G) IN 8 OUNCES OF JUICE/WATER AND DRINK ONCE DAILY ASNEEDED FOR CONSTIPATION.   PROAIR HFA 108 (90 BASE) MCG/ACT INHALER    USE 1 TO 2 PUFFS EVERY SIX HOURS AS NEEDED.   RANITIDINE (ZANTAC) 150 MG TABLET    TAKE ONE TABLET BY MOUTH ONCE DAILY.   SERTRALINE (ZOLOFT) 50 MG  TABLET    TAKE (1) TABLET BY MOUTH AT BEDTIME.   SPACER/AERO-HOLDING CHAMBERS (AEROCHAMBER PLUS WITH MASK) INHALER    Use as instructed   TORSEMIDE (DEMADEX) 20 MG TABLET    TAKE 1/2 TABLET BY MOUTH EVERY MORNING.  Modified Medications   No medications on file  Discontinued Medications   No medications on file

## 2017-05-06 NOTE — Patient Instructions (Signed)
Today we updated your med list in our EPIC system...    Continue your current medications the same...  Today we checked your follow up blood work...    We will contact you w/ the results when available...   Keep [facility] the good [facility] & [facility]...    Now try to increase the exercise program (chair exercise is great for you)...  Call for any questions...  Let's plan a follow up visit in 5mo, sooner if needed for problems.[name]..[name]

## 2017-06-19 ENCOUNTER — Telehealth: Payer: Self-pay | Admitting: Pulmonary Disease

## 2017-06-19 MED ORDER — FIRST-DUKES MOUTHWASH MT SUSP: OROMUCOSAL | 0 refills | Status: DC

## 2017-06-19 MED ORDER — DOXYCYCLINE HYCLATE 100 MG PO TABS: 100.0000 mg | ORAL_TABLET | Freq: Two times a day (BID) | ORAL | 0 refills | Status: DC

## 2017-06-19 NOTE — Telephone Encounter (Signed)
Per SN---  Ok to take the zpak #1  Take as directed per package MMW  1 tsp gargle and swallow TID Thanks

## 2017-06-19 NOTE — Telephone Encounter (Signed)
Left message for [name] to call back before sending in medications.

## 2017-06-19 NOTE — Telephone Encounter (Signed)
Called and spoke to pt's daughter, [name]. Pt c/o hoarseness x 2 days. Pt denies SOB, cough, PND, CP/tightness, or f/c/s. [name] states the assisted living facility gave her Robitussin but this did not help. [name] is requesting recs for pt.   Dr. Kriste BasqueNadel, please advise. Thanks.   Allergies  Allergen Reactions  . Azithromycin Shortness Of Breath  . Ciprofloxacin Other (See Comments)     hallucinations  . Levofloxacin Other (See Comments)    Insomnia, indigestion, tingling sensation in legs  . Furosemide Rash    Bullous pemphigoid  . Latex Rash  . Other Rash    EKG leads caused a rash that required steroids to clear    Current Outpatient Prescriptions on File Prior to Visit  Medication Sig Dispense Refill  . acetaminophen (TYLENOL) 325 MG tablet Take 650 mg by mouth every 4 (four) hours as needed for mild pain.    [name]. acetaZOLAMIDE (DIAMOX) 250 MG tablet TAKE (1) TABLET BY MOUTH EACH MORNING. 30 tablet 5  . ALPRAZolam (XANAX) 0.25 MG tablet Take 1 tablet (0.25 mg total) by mouth 2 (two) times daily. 60 tablet 5  . amLODipine (NORVASC) 5 MG tablet TAKE ONE TABLET BY MOUTH ONCE DAILY. 30 tablet 0  . ASPIRIN LOW DOSE 81 MG EC tablet TAKE ONE TABLET BY MOUTH ONCE DAILY. 30 tablet 0  . cetirizine (ZYRTEC) 10 MG tablet TAKE 1 TABLET BY MOUTH ONCE DAILY FOR ALLERGIES. (Patient taking differently: TAKE 10 MG BY MOUTH ONCE DAILY FOR ALLERGIES.) 30 tablet 5  . Cholecalciferol (VITAMIN D3) 2000 units capsule Take 1 capsule by mouth once daily 30 capsule 11  . doxycycline (VIBRA-TABS) 100 MG tablet Take 1 tablet (100 mg total) by mouth 2 (two) times daily. 14 tablet 0  . fluticasone (FLONASE) 50 MCG/ACT nasal spray SPRAY 2 SPRAYS INTO EACH NOSTRIL ONCE DAILY. 16 g 5  . Fluticasone-Salmeterol (ADVAIR DISKUS) 100-50 MCG/DOSE AEPB Inhale 1 puff into the lungs 2 (two) times daily. 60 each 5  . guaifenesin (ROBITUSSIN) 100 MG/5ML syrup Take 200 mg by mouth 3 (three) times daily as needed for cough.    [name].  HYDROcodone-homatropine (HYCODAN) 5-1.5 MG/5ML syrup Take 5 mLs by mouth every 6 (six) hours as needed for cough. 180 mL 0  . ipratropium-albuterol (DUONEB) 0.5-2.5 (3) MG/3ML SOLN Take 3 mLs by nebulization every 4 (four) hours as needed. 360 mL 0  . methimazole (TAPAZOLE) 5 MG tablet Take 2.5mg  every other day. 30 tablet 0  . Multiple Vitamin (DAILY-VITE) TABS TAKE ONE TABLET BY MOUTH ONCE DAILY. 30 tablet 11  . ondansetron (ZOFRAN) 4 MG tablet Take 1 tablet (4 mg total) by mouth every 6 (six) hours as needed for nausea or vomiting. 15 tablet 0  . polyethylene glycol powder (GLYCOLAX/MIRALAX) powder MIX 1 CAPFUL (17G) IN 8 OUNCES OF JUICE/WATER AND DRINK ONCE DAILY ASNEEDED FOR CONSTIPATION. 527 g 0  . PROAIR HFA 108 (90 Base) MCG/ACT inhaler USE 1 TO 2 PUFFS EVERY SIX HOURS AS NEEDED. 8.5 g 0  . ranitidine (ZANTAC) 150 MG tablet TAKE ONE TABLET BY MOUTH ONCE DAILY. 30 tablet 5  . sertraline (ZOLOFT) 50 MG tablet TAKE (1) TABLET BY MOUTH AT BEDTIME. 30 tablet 2  . Spacer/Aero-Holding Chambers (AEROCHAMBER PLUS WITH MASK) inhaler Use as instructed 1 each 2  . torsemide (DEMADEX) 20 MG tablet TAKE 1/2 TABLET BY MOUTH EVERY MORNING. (Patient taking differently: TAKE 20 MG BY MOUTH EVERY MORNING.) 15 tablet 11   No current facility-administered medications on file  prior to visit.

## 2017-06-19 NOTE — Telephone Encounter (Signed)
Patient's daughter [name] is returning phone call

## 2017-06-19 NOTE — Telephone Encounter (Signed)
Spoke with [facility] Solutions[facility]irley. She is aware of the medications. Will call them into [facility]. Nothing else was needed at time of call.

## 2017-06-19 NOTE — Telephone Encounter (Signed)
Pt has a reported allergy to azithromycin causing SOB.   Dr. Kriste BasqueNadel, please advise if you want to prescribe something different. Thanks.   Allergies  Allergen Reactions  . Azithromycin Shortness Of Breath  . Ciprofloxacin Other (See Comments)     hallucinations  . Levofloxacin Other (See Comments)    Insomnia, indigestion, tingling sensation in legs  . Furosemide Rash    Bullous pemphigoid  . Latex Rash  . Other Rash    EKG leads caused a rash that required steroids to clear

## 2017-06-19 NOTE — Telephone Encounter (Signed)
Per SN---  Please change to doxycycline 100 mg  #14  1 po bid.  thanks

## 2017-07-08 ENCOUNTER — Other Ambulatory Visit: Payer: Self-pay | Admitting: Pulmonary Disease

## 2017-07-09 ENCOUNTER — Telehealth: Payer: Self-pay | Admitting: Pulmonary Disease

## 2017-07-09 ENCOUNTER — Other Ambulatory Visit: Payer: Self-pay | Admitting: Pulmonary Disease

## 2017-07-09 NOTE — Telephone Encounter (Signed)
I have located this form and it has been signed by SN. Form has been faxed back. Nothing further was needed.

## 2017-07-09 NOTE — Telephone Encounter (Signed)
Refill request of Xanax 0.25mg , dispense 60 tabs with 0 refills. Pt last seen at office 05/06/17. Med last refilled 07/12/16.  Dr. Kriste Basque, please advise if it is okay to refill this med.

## 2017-07-11 ENCOUNTER — Telehealth: Payer: Self-pay | Admitting: Pulmonary Disease

## 2017-07-11 NOTE — Telephone Encounter (Signed)
Spoke with [name]. I confirmed the fax number that we had, it was correct. The forms have faxed again and placed back at [facility] desk. She verbalized understanding. Nothing else was needed at time of call.

## 2017-07-24 ENCOUNTER — Telehealth: Payer: Self-pay | Admitting: Pulmonary Disease

## 2017-07-24 NOTE — Telephone Encounter (Signed)
lmtcb X1 for pt's daughter 

## 2017-07-25 ENCOUNTER — Other Ambulatory Visit: Payer: Self-pay | Admitting: Pulmonary Disease

## 2017-07-25 MED ORDER — ALPRAZOLAM 0.25 MG PO TABS: ORAL_TABLET | ORAL | 5 refills | Status: DC

## 2017-07-25 MED ORDER — SERTRALINE HCL 50 MG PO TABS: ORAL_TABLET | ORAL | 5 refills | Status: DC

## 2017-07-25 NOTE — Telephone Encounter (Signed)
Spoke with pt's daughter [name]. [name] states pt is currently in assisting living. Pt is reporting of increased fatigue and decreased fatigue. Pt feels that it may be related to medications. Pt is doing well otherwise per [name].  SN please advise. Thanks.

## 2017-07-25 NOTE — Telephone Encounter (Signed)
Per SN---  Ok to take the :  Xanax   1/2 tab BID zoloftt  1/2 tab daily  I have called the pts daughter and she requested that these new rx be [facility] to [facility].  They have been printed out and faxed to [facility]. Nothing further is needed.

## 2017-07-25 NOTE — Telephone Encounter (Signed)
[name], patient's daughter, returned call.  CB is [phone]

## 2017-07-30 ENCOUNTER — Other Ambulatory Visit: Payer: Self-pay | Admitting: Pulmonary Disease

## 2017-07-31 ENCOUNTER — Other Ambulatory Visit: Payer: Self-pay | Admitting: Pulmonary Disease

## 2017-08-05 ENCOUNTER — Other Ambulatory Visit: Payer: Self-pay | Admitting: Pulmonary Disease

## 2017-08-06 ENCOUNTER — Telehealth: Payer: Self-pay | Admitting: Pulmonary Disease

## 2017-08-06 NOTE — Telephone Encounter (Signed)
Spoke with [employer]. She stated that per 07/25/17, SN wanted the patient to only take half of the Xanax and Zoloft tablets. The patient has been sleeping a lot and she was wondering if an updated order had been sent to [facility].   Spoke with Tamala Bari at [facility], she stated that they never received an order for the medication change. Provided me with a fax in order to send the orders to. [phone]). Will fax these orders over today.   Nothing else needed at time of call.

## 2017-08-12 ENCOUNTER — Other Ambulatory Visit: Payer: Self-pay | Admitting: Pulmonary Disease

## 2017-08-13 ENCOUNTER — Other Ambulatory Visit: Payer: Self-pay | Admitting: Pulmonary Disease

## 2017-08-17 IMAGING — DX DG ABDOMEN 2V
3 series · 3 of 3 positions shown · non-contrast
Comparison: Abdominal and pelvic CT scan November 26, 2007 and
abdominal ultrasound dated April 15, 2014

CLINICAL DATA: Fatigue and decreased appetite, history of
hyperlipidemia, COPD, Constipation,

EXAM:
ABDOMEN - 2 VIEW

[abdomen erect]
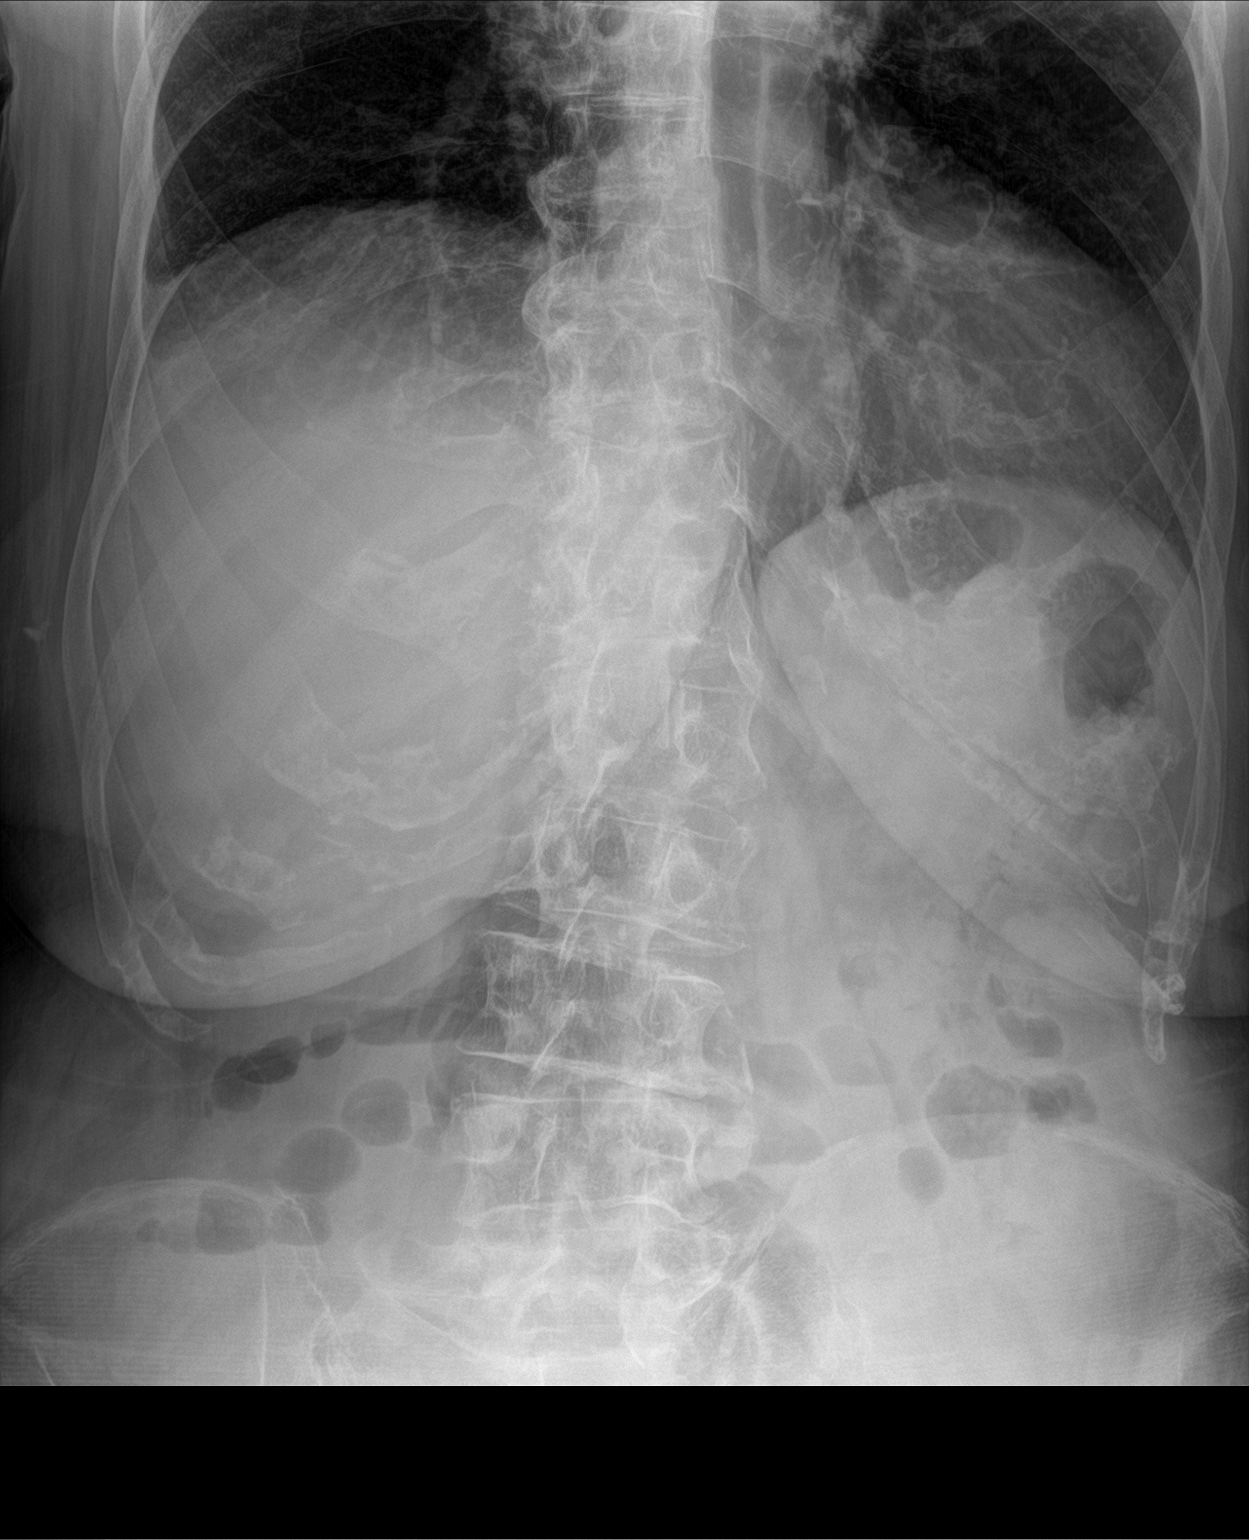

[abdomen supine (1 of 2)]
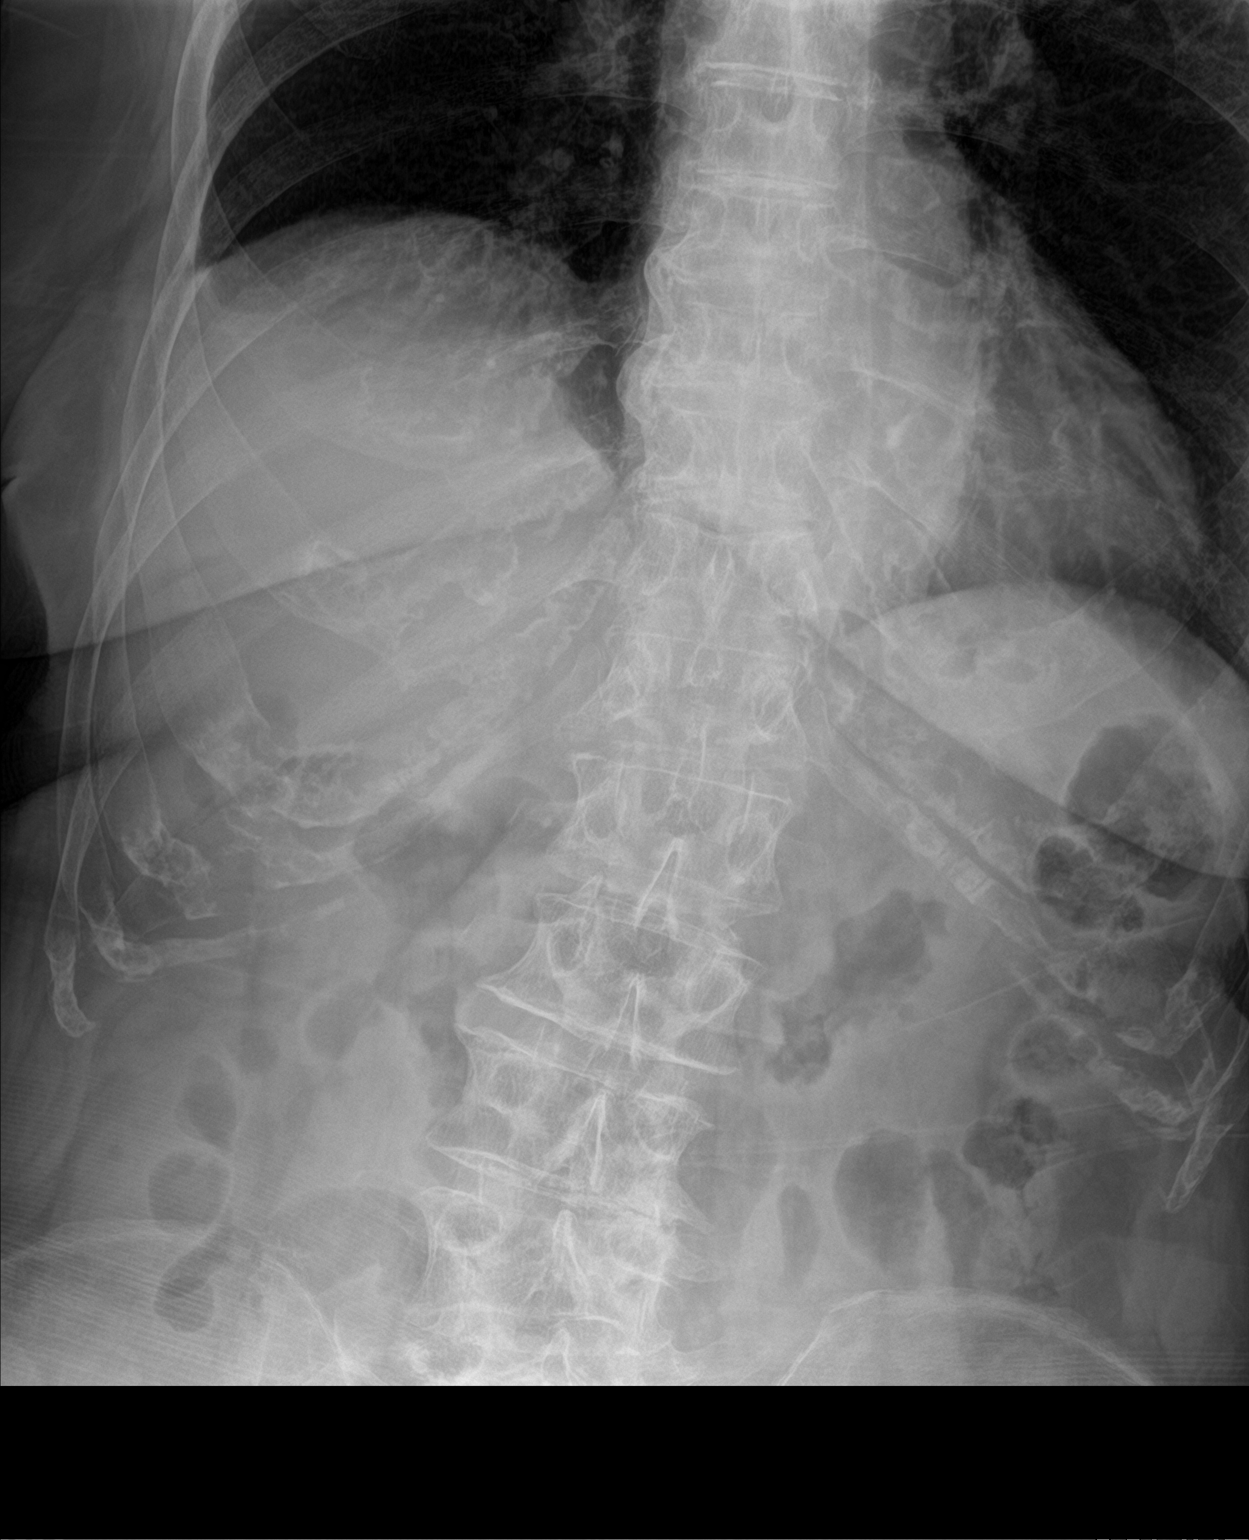

[abdomen supine (2 of 2)]
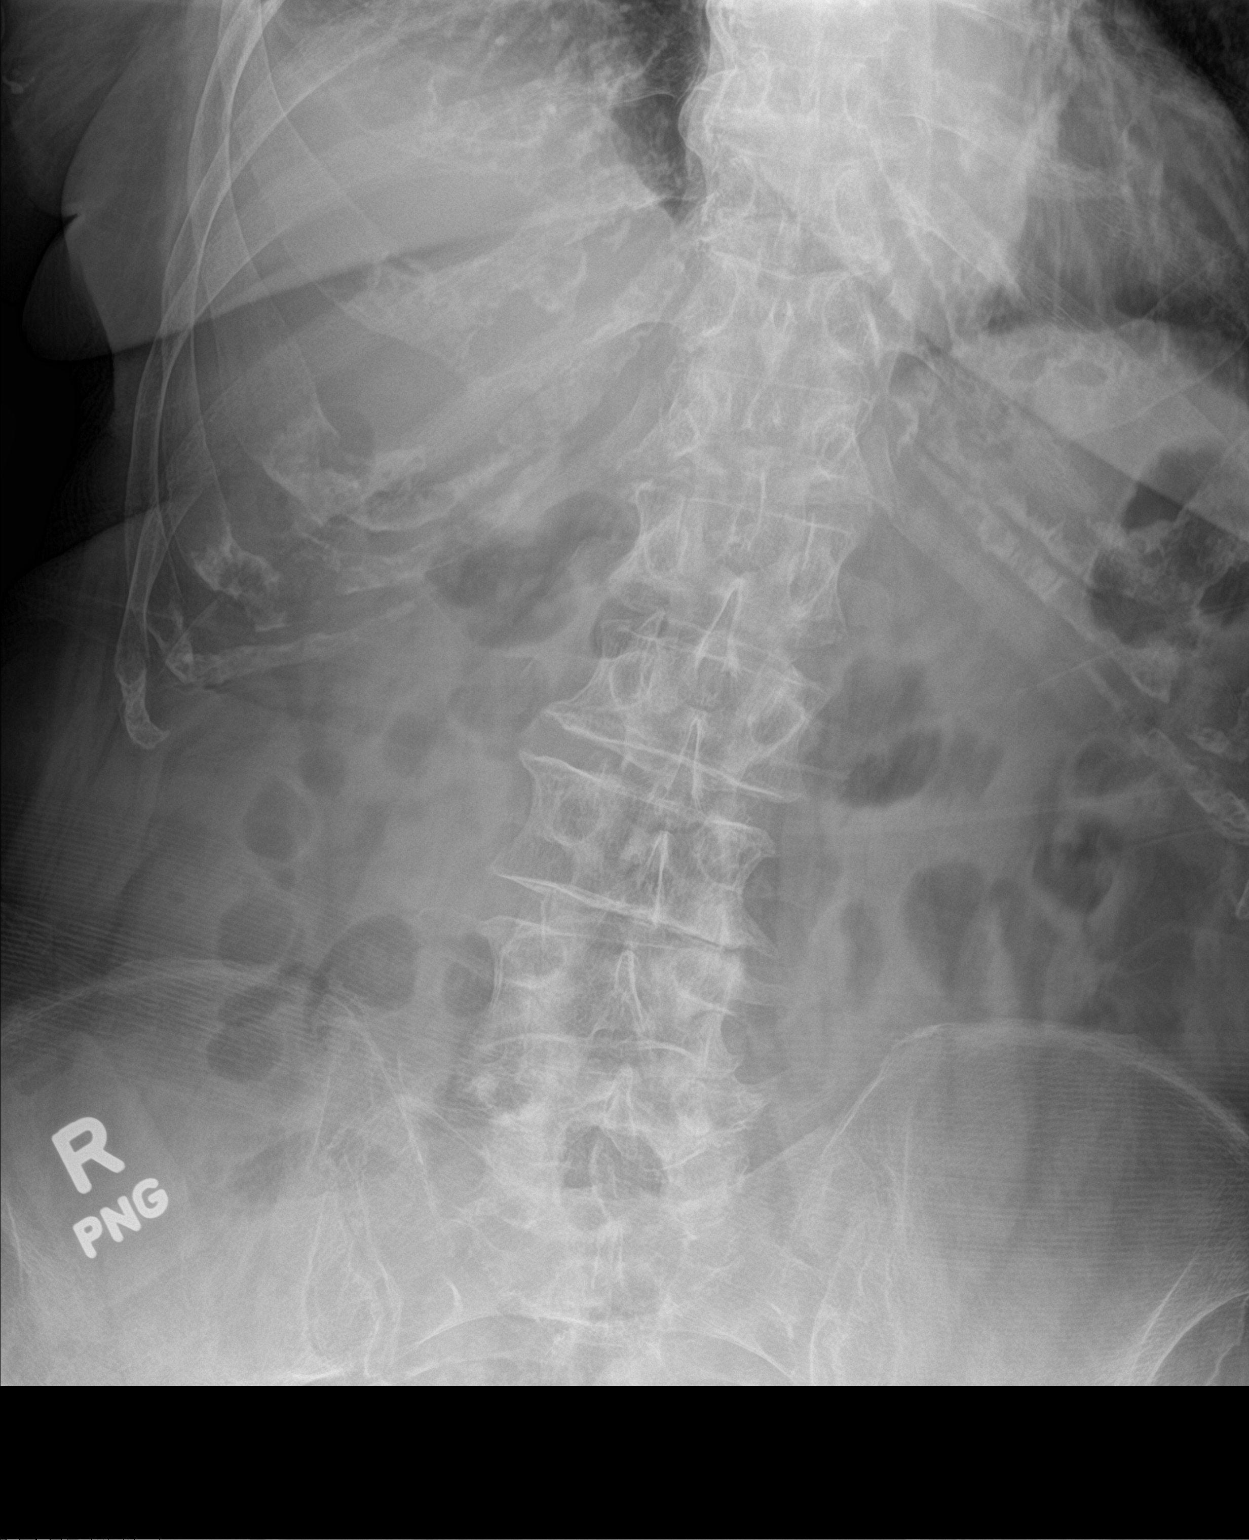

[3 of 3 positions shown; findings below may reference images not displayed]

FINDINGS: The small and large bowel gas pattern is within the limits of
normal. There is no free extraluminal gas observed. There is
elevation of the right hemidiaphragm which is chronic. There is
curvature of the thoracolumbar spine. The pedicles are intact. The
patient has undergone previous right hip prosthesis placement.
IMPRESSION: No acute intra-abdominal abnormality is observed on this three-view
series. The lower pelvic structures are not included in the field of
view.

## 2017-08-19 ENCOUNTER — Telehealth: Payer: Self-pay | Admitting: Pulmonary Disease

## 2017-08-19 MED ORDER — ALBUTEROL SULFATE HFA 108 (90 BASE) MCG/ACT IN AERS: INHALATION_SPRAY | RESPIRATORY_TRACT | 0 refills | Status: AC

## 2017-08-19 MED ORDER — ALBUTEROL SULFATE HFA 108 (90 BASE) MCG/ACT IN AERS: INHALATION_SPRAY | RESPIRATORY_TRACT | 2 refills | Status: DC

## 2017-08-19 NOTE — Telephone Encounter (Signed)
Spoke with pt's daughter. Sent Rx to [facility] but she would also like a note to give to [facility] facility so her mom can keep her inhaler in her room. SN can we wite note and have you sign. Letter pended.

## 2017-08-20 IMAGING — CR DG CHEST 2V
3 series · 3 of 3 positions shown · non-contrast
Comparison: 04/20/2016

CLINICAL DATA: Increasing shortness of breath for 3 days.

EXAM:
CHEST  2 VIEW

[w chest lat]
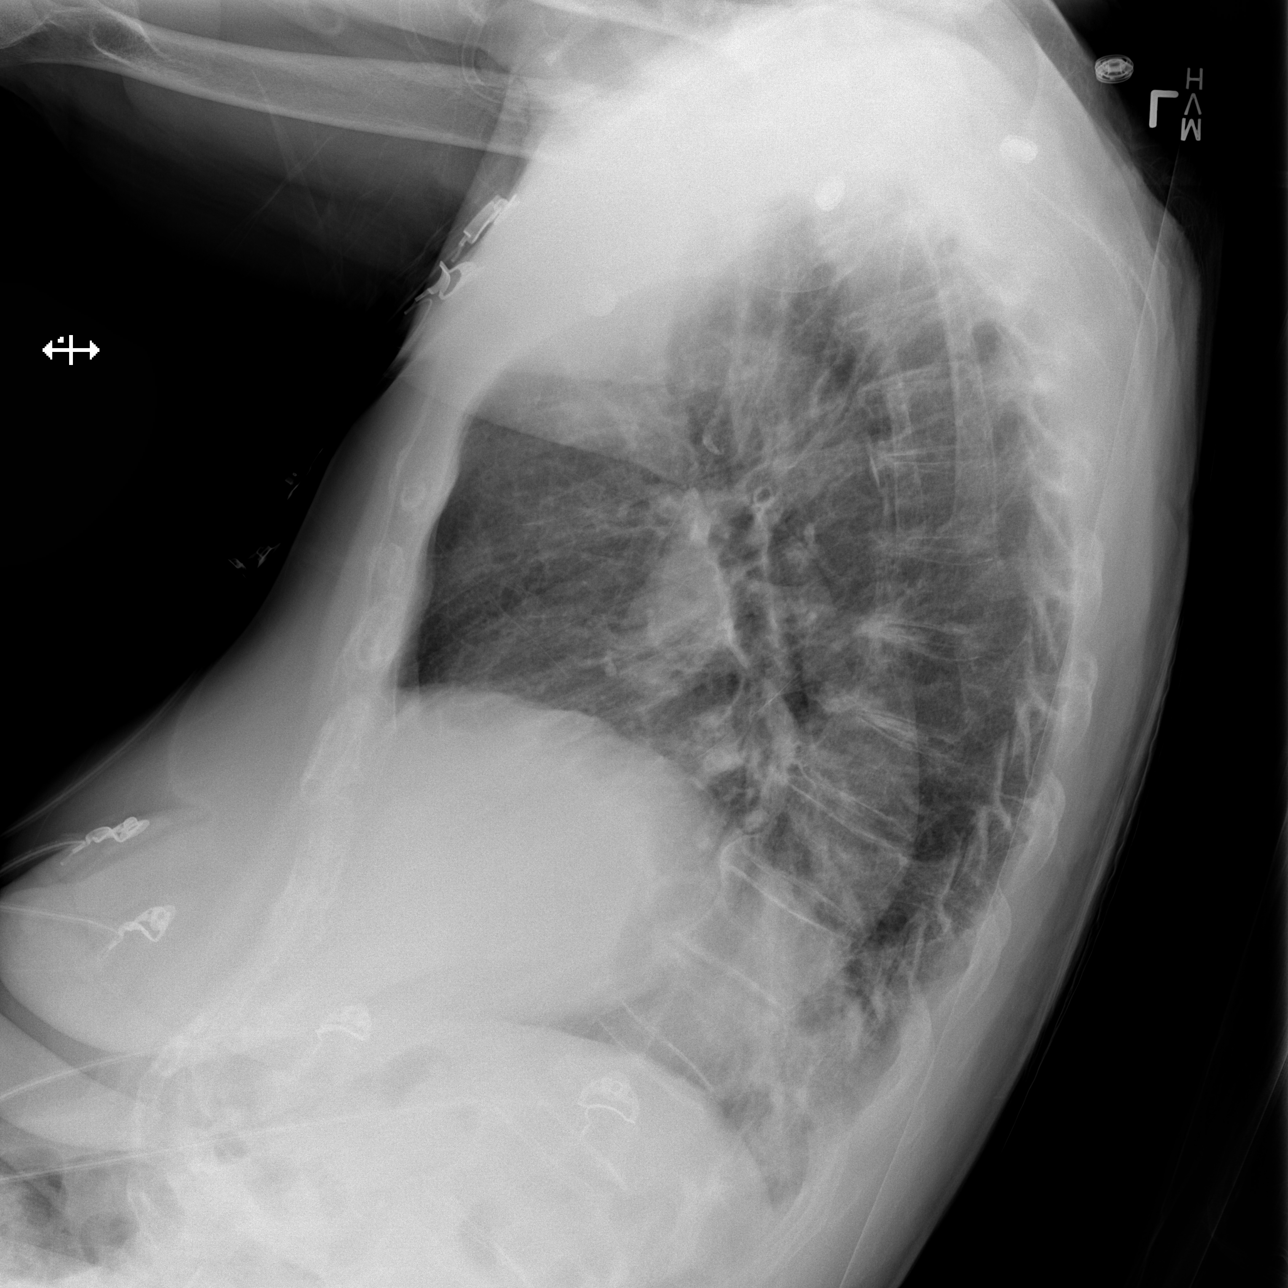

[x chest ap (1 of 2)]
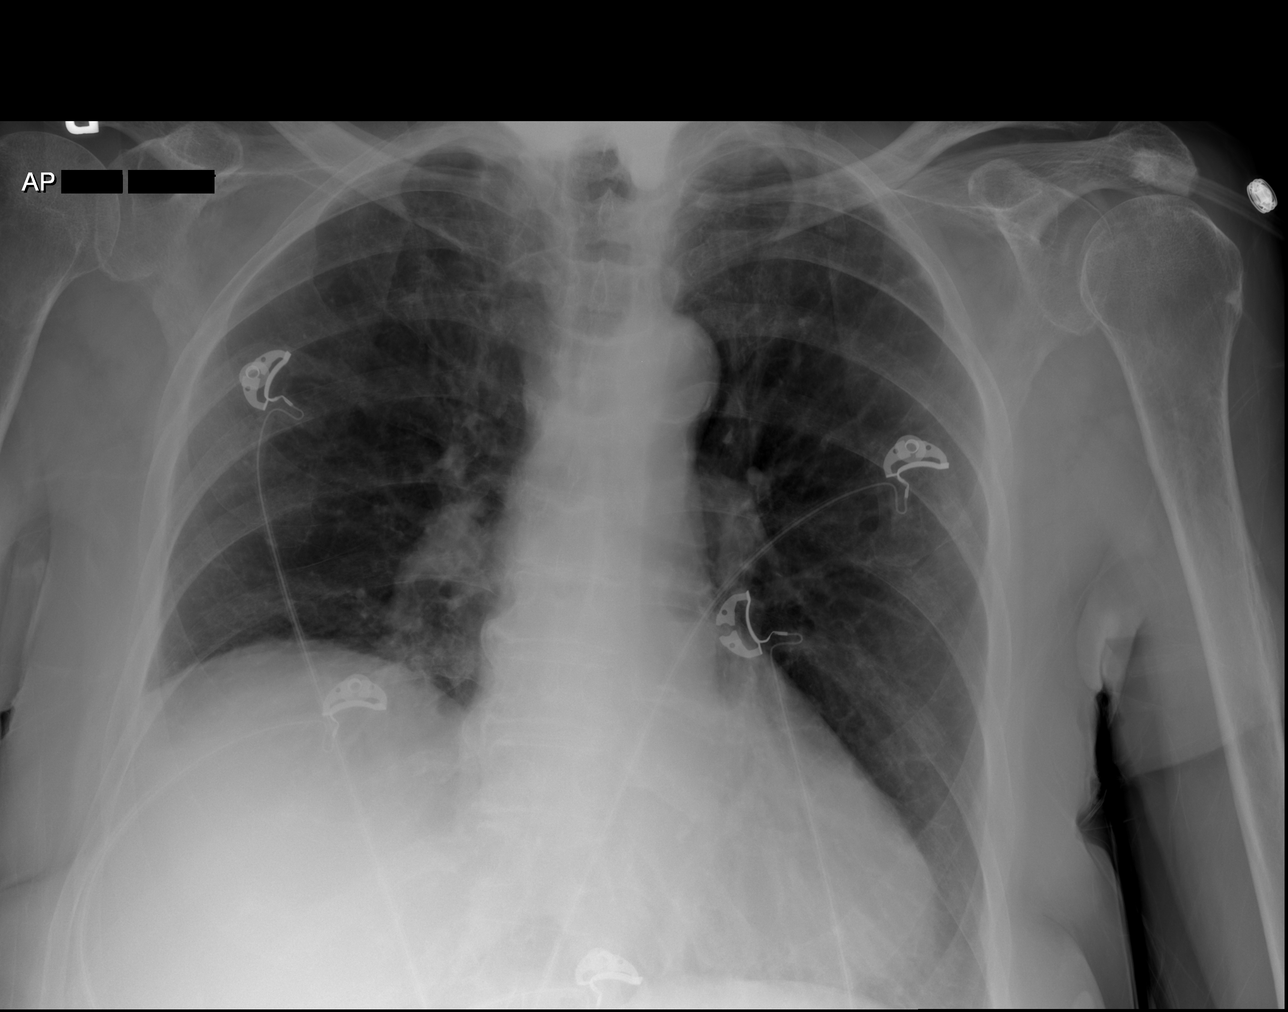

[x chest ap (2 of 2)]
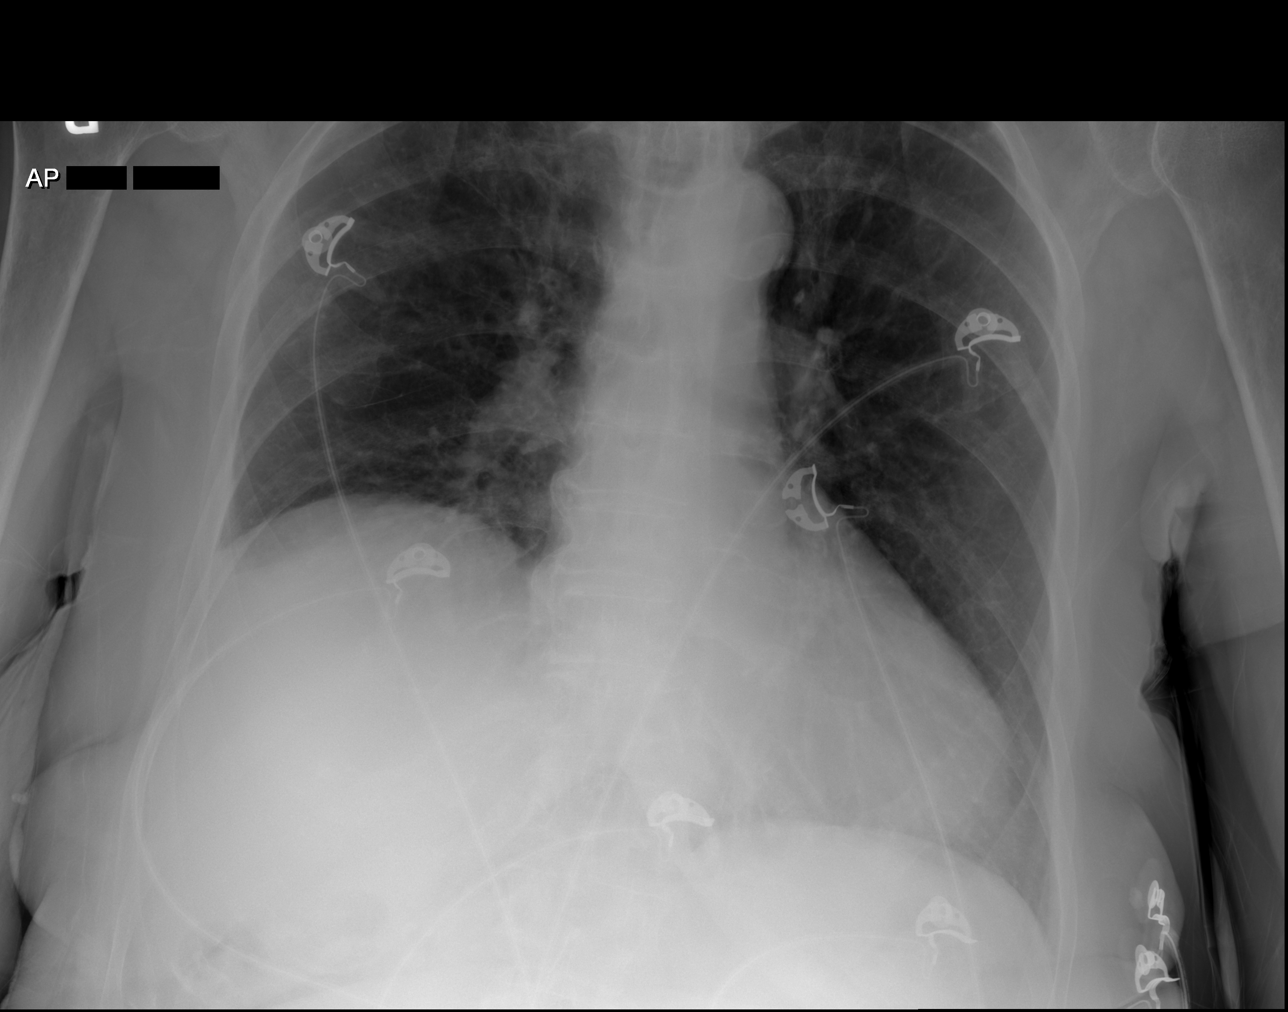

[3 of 3 positions shown; findings below may reference images not displayed]

FINDINGS: Stable elevation of the right hemidiaphragm. No confluent opacities
or effusions. Heart is normal size. No acute bony abnormality.
IMPRESSION: Elevated right hemidiaphragm, stable.  No active disease.

## 2017-08-20 NOTE — Telephone Encounter (Signed)
I have the rx for the proair.  I have called the pts daughter to find out if she wants to come by and pick this up or have it mailed to her.  Will hold rx in triage until daughter calls back.

## 2017-08-21 NOTE — Telephone Encounter (Signed)
Patient's daughter request to pick up letter. She is on her way to pick up letter.

## 2017-08-21 NOTE — Telephone Encounter (Signed)
Called and spoke to pt's daughter, Talbert Forest. Pt was only needing a letter for the nursing facility stating pt can have her proair in her room. Letter was printed and given to [place]. Rx was already sent to [facility] as requested. Nothing further needed at this time. Will sign off.

## 2017-08-23 ENCOUNTER — Ambulatory Visit (INDEPENDENT_AMBULATORY_CARE_PROVIDER_SITE_OTHER): Payer: Medicare Other | Admitting: Adult Health

## 2017-08-23 ENCOUNTER — Telehealth: Payer: Self-pay | Admitting: *Deleted

## 2017-08-23 ENCOUNTER — Encounter: Payer: Self-pay | Admitting: Adult Health

## 2017-08-23 DIAGNOSIS — L03119 Cellulitis of unspecified part of limb: Secondary | ICD-10-CM | POA: Diagnosis not present

## 2017-08-23 DIAGNOSIS — L0390 Cellulitis, unspecified: Secondary | ICD-10-CM | POA: Insufficient documentation

## 2017-08-23 DIAGNOSIS — R609 Edema, unspecified: Secondary | ICD-10-CM

## 2017-08-23 MED ORDER — TORSEMIDE 20 MG PO TABS: ORAL_TABLET | ORAL | 5 refills | Status: AC

## 2017-08-23 MED ORDER — DOXYCYCLINE HYCLATE 100 MG PO TABS: 100.0000 mg | ORAL_TABLET | Freq: Two times a day (BID) | ORAL | 0 refills | Status: DC

## 2017-08-23 NOTE — Progress Notes (Signed)
  ID: [name], female    DOB: [birth year], 83 y.o.   MRN: [id number]  Chief Complaint  Patient presents with  . Acute Visit    LE edema     Referring provider: Michele Mcalpine, MD  HPI: 83 yo female never smoker followed for PCP by Dr. Kriste Basque  . She has known HTN, RBBB , Hyperlipidemia, DJD , chronic bronchitis , borderline DM and Anemia   TEST  2-D echo January 2016 showed EF of 65%, pulmonary pressure 37 mmHg  08/23/2017 Acute OV : leg red  Patient presents with her daughter. Patient is at assisted living. Daughter noticed that patient's legs were red this morning. Patient has chronic lower extremity swelling. She is on Demadex 10 mg daily. Legs are more swollen the last couple days. This morning. Had some weeping and redness along the lower extremities. She denies any fever. No calf pain. Appetite has been good with no nausea, vomiting, diarrhea. She denies any shortness of breath or orthopnea. Says other than her leg. She is actually been feeling better last few weeks.   Allergies  Allergen Reactions  . Azithromycin Shortness Of Breath  . Ciprofloxacin Other (See Comments)     hallucinations  . Levofloxacin Other (See Comments)    Insomnia, indigestion, tingling sensation in legs  . Furosemide Rash    Bullous pemphigoid  . Latex Rash  . Other Rash    EKG leads caused a rash that required steroids to clear    Immunization History  Administered Date(s) Administered  . Influenza Split 09/12/2011, 09/16/2012  . Influenza Whole 09/18/2006, 08/11/2008, 12/12/2009  . Influenza, High Dose Seasonal PF 12/04/2016  . Influenza,inj,Quad PF,6+ Mos 08/17/2013, 07/30/2014, 08/09/2015  . PPD Test 08/09/2015  . Pneumococcal Conjugate-13 08/09/2015  . Tdap 05/14/2011    Past Medical History:  Diagnosis Date  . Anemia, unspecified   . Diverticulosis of colon (without mention of hemorrhage)   . DJD (degenerative joint disease)   . GERD (gastroesophageal reflux  disease)   . Lumbago   . Osteoarthrosis, unspecified whether generalized or localized, unspecified site   . Other abnormal glucose   . Other and unspecified hyperlipidemia   . Pyelonephritis   . Pyelonephritis, unspecified   . Right bundle branch block   . Shortness of breath   . Solitary cyst of breast   . Spinal stenosis, unspecified region other than cervical   . Unspecified essential hypertension   . UTI (lower urinary tract infection)     Tobacco History: History  Smoking Status  . Never Smoker  Smokeless Tobacco  . Never Used   Counseling given: Not Answered   Outpatient Encounter Prescriptions as of 08/23/2017  Medication Sig  . acetaminophen (TYLENOL) 325 MG tablet Take 650 mg by mouth every 4 (four) hours as needed for mild pain.  [name] acetaZOLAMIDE (DIAMOX) 250 MG tablet TAKE (1) TABLET BY MOUTH EACH MORNING.  [name] ADVAIR DISKUS 100-50 MCG/DOSE AEPB INHALE 1 PUFF BY MOUTH TWICE DAILY. RINSE MOUTH AFTER USE TO PREVENT THRUSH.  [name] albuterol (PROAIR HFA) 108 (90 Base) MCG/ACT inhaler USE 1 TO 2 PUFFS EVERY SIX HOURS AS NEEDED.  [name] albuterol (PROAIR HFA) 108 (90 Base) MCG/ACT inhaler USE 1 TO 2 PUFFS EVERY SIX HOURS AS NEEDED.  [name] ALPRAZolam (XANAX) 0.25 MG tablet 1/2 tab by mouth twice daily  . amLODipine (NORVASC) 5 MG tablet TAKE ONE TABLET BY MOUTH ONCE DAILY.  [name] ASPIRIN LOW DOSE 81 MG EC tablet TAKE ONE TABLET  BY MOUTH ONCE DAILY.  . cetirizine (ZYRTEC) 10 MG tablet TAKE 1 TABLET BY MOUTH ONCE DAILY FOR ALLERGIES.  [name] Cholecalciferol (VITAMIN D3) 2000 units capsule Take 1 capsule by mouth once daily  . Diphenhyd-Hydrocort-Nystatin (FIRST-DUKES MOUTHWASH) SUSP Gargle and swallow 1 tsp four times daily  . doxycycline (VIBRA-TABS) 100 MG tablet Take 1 tablet (100 mg total) by mouth 2 (two) times daily.  . fluticasone (FLONASE) 50 MCG/ACT nasal spray SPRAY 2 SPRAYS INTO EACH NOSTRIL ONCE DAILY.  [name] guaifenesin (ROBITUSSIN) 100 MG/5ML syrup Take 200 mg by mouth 3 (three) times daily  as needed for cough.  [name] HYDROcodone-homatropine (HYCODAN) 5-1.5 MG/5ML syrup Take 5 mLs by mouth every 6 (six) hours as needed for cough.  [name] ipratropium-albuterol (DUONEB) 0.5-2.5 (3) MG/3ML SOLN Take 3 mLs by nebulization every 4 (four) hours as needed.  . methimazole (TAPAZOLE) 5 MG tablet TAKE 1/2 TABLET (2.5MG ) BY MOUTH EVERY OTHER DAY.  . Multiple Vitamin (DAILY-VITE) TABS TAKE ONE TABLET BY MOUTH ONCE DAILY.  [name] ondansetron (ZOFRAN) 4 MG tablet Take 1 tablet (4 mg total) by mouth every 6 (six) hours as needed for nausea or vomiting.  . polyethylene glycol powder (GLYCOLAX/MIRALAX) powder MIX 1 CAPFUL (17G) IN 8 OUNCES OF JUICE/WATER AND DRINK ONCE DAILY ASNEEDED FOR CONSTIPATION.  . ranitidine (ZANTAC) 150 MG tablet TAKE (1) TABLET BY MOUTH ONCE DAILY.  [name] sertraline (ZOLOFT) 50 MG tablet 1/2 tablet by mouth daily  . Spacer/Aero-Holding Chambers (AEROCHAMBER PLUS WITH MASK) inhaler Use as instructed  . torsemide (DEMADEX) 20 MG tablet TAKE 20 MG BY MOUTH EVERY MORNING.  . [DISCONTINUED] ALPRAZolam (XANAX) 0.25 MG tablet TAKE (1) TABLET BY MOUTH TWICE DAILY.  . [DISCONTINUED] torsemide (DEMADEX) 20 MG tablet TAKE 1/2 TABLET BY MOUTH EVERY MORNING. (Patient taking differently: TAKE 20 MG BY MOUTH EVERY MORNING.)  . doxycycline (VIBRA-TABS) 100 MG tablet Take 1 tablet (100 mg total) by mouth 2 (two) times daily.   No facility-administered encounter medications on file as of 08/23/2017.      Review of Systems  Constitutional:   No  weight loss, night sweats,  Fevers, chills, fatigue, or  lassitude.  HEENT:   No headaches,  Difficulty swallowing,  Tooth/dental problems, or  Sore throat,                No sneezing, itching, ear ache, nasal congestion, post nasal drip,   CV:  No chest pain,  Orthopnea, PND,  anasarca, dizziness, palpitations, syncope.   GI  No heartburn, indigestion, abdominal pain, nausea, vomiting, diarrhea, change in bowel habits, loss of appetite, bloody stools.   Resp:  No shortness of breath with exertion or at rest.  No excess mucus, no productive cough,  No non-productive cough,  No coughing up of blood.  No change in color of mucus.  No wheezing.  No chest wall deformity  Skin: +redness and weeping of LE   GU: no dysuria, change in color of urine, no urgency or frequency.  No flank pain, no hematuria   MS:  No joint pain or swelling.  No decreased range of motion.  No back pain.    Physical Exam  BP 134/74 (BP Location: Left Arm, Cuff Size: Normal)   Pulse 76   Wt 191 lb 3.2 oz (86.7 kg)   SpO2 92%   BMI 29.50 kg/m   GEN: A/Ox3; pleasant , NAD, elderly walks with walker    HEENT:  [place]/AT,  EACs-clear, TMs-wnl, NOSE-clear, THROAT-clear, no lesions, no postnasal drip  or exudate noted.   NECK:  Supple w/ fair ROM; no JVD; normal carotid impulses w/o bruits; no thyromegaly or nodules palpated; no lymphadenopathy.    RESP  Clear  P & A; w/o, wheezes/ rales/ or rhonchi. no accessory muscle use, no dullness to percussion  CARD:  RRR, no m/r/g, 1-2+  peripheral edema, pulses intact, no cyanosis or clubbing. Neg homans sign   GI:   Soft & nt; nml bowel sounds; no organomegaly or masses detected.   Musco: Warm bil, no deformities or joint swelling noted.   Neuro: alert, no focal deficits noted.    Skin: Warm, redness and excoriations R>L , weeping clear fluid, warm to tocuh     Lab Results:  CBC   No results found.   Assessment & Plan:   EDEMA Venous Insufficiency w/ edema , slightly worse  Will increase gentle diuresis , return in 2 weeks with BMET   Plan  Patient Instructions  Doxycycline  Twice daily  For 1 week .  Keep legs elevated.  Wash legs daily and pat dry .  Increase Torsemide  daily .  Follow up in 2 weeks with labs (bmet ) . And As needed   Please contact office for sooner follow up if symptoms do not improve or worsen or seek emergency care      Cellulitis Bilateral LE cellultiis R>L   Plan    Patient Instructions  Doxycycline  Twice daily  For 1 week .  Keep legs elevated.  Wash legs daily and pat dry .  Increase Torsemide  daily .  Follow up in 2 weeks with labs (bmet ) . And As needed   Please contact office for sooner follow up if symptoms do not improve or worsen or seek emergency care         Rubye Oaks, NP 08/23/2017

## 2017-08-23 NOTE — Patient Instructions (Signed)
Doxycycline  Twice daily  For 1 week .  Keep legs elevated.  Wash legs daily and pat dry .  Increase Torsemide  daily .  Follow up in 2 weeks with labs (bmet ) . And As needed   Please contact office for sooner follow up if symptoms do not improve or worsen or seek emergency care

## 2017-08-23 NOTE — Assessment & Plan Note (Signed)
Venous Insufficiency w/ edema , slightly worse  Will increase gentle diuresis , return in 2 weeks with BMET   Plan  Patient Instructions  Doxycycline  Twice daily  For 1 week .  Keep legs elevated.  Wash legs daily and pat dry .  Increase Torsemide  daily .  Follow up in 2 weeks with labs (bmet ) . And As needed   Please contact office for sooner follow up if symptoms do not improve or worsen or seek emergency care

## 2017-08-23 NOTE — Telephone Encounter (Signed)
Spoke with pt's daughter send advised her to bring pt in to be evaluated since this is hard to cover over the phone. She states her nurse thinks it is cellulitis but she is not sure. We made an appt with TP at 2:00 to be evaluated today in the office. Nothing further is needed.

## 2017-08-23 NOTE — Assessment & Plan Note (Signed)
Bilateral LE cellultiis R>L   Plan  Patient Instructions  Doxycycline  Twice daily  For 1 week .  Keep legs elevated.  Wash legs daily and pat dry .  Increase Torsemide  daily .  Follow up in 2 weeks with labs (bmet ) . And As needed   Please contact office for sooner follow up if symptoms do not improve or worsen or seek emergency care

## 2017-08-27 ENCOUNTER — Telehealth: Payer: Self-pay | Admitting: Pulmonary Disease

## 2017-08-27 ENCOUNTER — Emergency Department (HOSPITAL_COMMUNITY): Payer: Medicare Other

## 2017-08-27 ENCOUNTER — Encounter (HOSPITAL_COMMUNITY): Payer: Self-pay

## 2017-08-27 ENCOUNTER — Inpatient Hospital Stay (HOSPITAL_COMMUNITY)
Admission: EM | Admit: 2017-08-27 | Discharge: 2017-08-30 | DRG: 562 | Disposition: A | Discharge status: Skilled Nursing Facility | Payer: Medicare Other | Attending: Internal Medicine | Admitting: Internal Medicine

## 2017-08-27 DIAGNOSIS — J441 Chronic obstructive pulmonary disease with (acute) exacerbation: Secondary | ICD-10-CM | POA: Diagnosis present

## 2017-08-27 DIAGNOSIS — L03116 Cellulitis of left lower limb: Secondary | ICD-10-CM | POA: Diagnosis present

## 2017-08-27 DIAGNOSIS — I10 Essential (primary) hypertension: Secondary | ICD-10-CM | POA: Diagnosis not present

## 2017-08-27 DIAGNOSIS — Z91040 Latex allergy status: Secondary | ICD-10-CM | POA: Diagnosis not present

## 2017-08-27 DIAGNOSIS — M1990 Unspecified osteoarthritis, unspecified site: Secondary | ICD-10-CM | POA: Diagnosis present

## 2017-08-27 DIAGNOSIS — I878 Other specified disorders of veins: Secondary | ICD-10-CM | POA: Diagnosis present

## 2017-08-27 DIAGNOSIS — Z96641 Presence of right artificial hip joint: Secondary | ICD-10-CM | POA: Diagnosis present

## 2017-08-27 DIAGNOSIS — S42202A Unspecified fracture of upper end of left humerus, initial encounter for closed fracture: Principal | ICD-10-CM | POA: Diagnosis present

## 2017-08-27 DIAGNOSIS — Z7982 Long term (current) use of aspirin: Secondary | ICD-10-CM

## 2017-08-27 DIAGNOSIS — J9601 Acute respiratory failure with hypoxia: Secondary | ICD-10-CM | POA: Diagnosis not present

## 2017-08-27 DIAGNOSIS — Y92091 Bathroom in other non-institutional residence as the place of occurrence of the external cause: Secondary | ICD-10-CM | POA: Diagnosis not present

## 2017-08-27 DIAGNOSIS — M4800 Spinal stenosis, site unspecified: Secondary | ICD-10-CM | POA: Diagnosis present

## 2017-08-27 DIAGNOSIS — W1830XA Fall on same level, unspecified, initial encounter: Secondary | ICD-10-CM | POA: Diagnosis present

## 2017-08-27 DIAGNOSIS — D649 Anemia, unspecified: Secondary | ICD-10-CM | POA: Diagnosis present

## 2017-08-27 DIAGNOSIS — N183 Chronic kidney disease, stage 3 unspecified: Secondary | ICD-10-CM | POA: Diagnosis present

## 2017-08-27 DIAGNOSIS — S42302A Unspecified fracture of shaft of humerus, left arm, initial encounter for closed fracture: Secondary | ICD-10-CM

## 2017-08-27 DIAGNOSIS — K219 Gastro-esophageal reflux disease without esophagitis: Secondary | ICD-10-CM | POA: Diagnosis present

## 2017-08-27 DIAGNOSIS — J9811 Atelectasis: Secondary | ICD-10-CM | POA: Diagnosis present

## 2017-08-27 DIAGNOSIS — I4510 Unspecified right bundle-branch block: Secondary | ICD-10-CM | POA: Diagnosis present

## 2017-08-27 DIAGNOSIS — S42292A Other displaced fracture of upper end of left humerus, initial encounter for closed fracture: Secondary | ICD-10-CM | POA: Diagnosis not present

## 2017-08-27 DIAGNOSIS — R0902 Hypoxemia: Secondary | ICD-10-CM | POA: Diagnosis present

## 2017-08-27 DIAGNOSIS — J42 Unspecified chronic bronchitis: Secondary | ICD-10-CM | POA: Diagnosis not present

## 2017-08-27 DIAGNOSIS — Z79899 Other long term (current) drug therapy: Secondary | ICD-10-CM

## 2017-08-27 DIAGNOSIS — L03115 Cellulitis of right lower limb: Secondary | ICD-10-CM | POA: Diagnosis present

## 2017-08-27 DIAGNOSIS — I129 Hypertensive chronic kidney disease with stage 1 through stage 4 chronic kidney disease, or unspecified chronic kidney disease: Secondary | ICD-10-CM | POA: Diagnosis present

## 2017-08-27 DIAGNOSIS — I872 Venous insufficiency (chronic) (peripheral): Secondary | ICD-10-CM | POA: Diagnosis present

## 2017-08-27 DIAGNOSIS — Z881 Allergy status to other antibiotic agents status: Secondary | ICD-10-CM

## 2017-08-27 DIAGNOSIS — J449 Chronic obstructive pulmonary disease, unspecified: Secondary | ICD-10-CM | POA: Diagnosis present

## 2017-08-27 DIAGNOSIS — Z91048 Other nonmedicinal substance allergy status: Secondary | ICD-10-CM

## 2017-08-27 DIAGNOSIS — E785 Hyperlipidemia, unspecified: Secondary | ICD-10-CM | POA: Diagnosis present

## 2017-08-27 DIAGNOSIS — Z7951 Long term (current) use of inhaled steroids: Secondary | ICD-10-CM

## 2017-08-27 HISTORY — DX: Chronic obstructive pulmonary disease, unspecified: J44.9

## 2017-08-27 LAB — URINALYSIS, ROUTINE W REFLEX MICROSCOPIC
BILIRUBIN URINE: NEGATIVE
Glucose, UA: NEGATIVE mg/dL
Hgb urine dipstick: NEGATIVE
KETONES UR: NEGATIVE mg/dL
Leukocytes, UA: NEGATIVE
NITRITE: NEGATIVE
Protein, ur: NEGATIVE mg/dL
Specific Gravity, Urine: 1.015 (ref 1.005–1.030)
pH: 6 (ref 5.0–8.0)

## 2017-08-27 LAB — CBC WITH DIFFERENTIAL/PLATELET
BASOS PCT: 0 %
Basophils Absolute: 0 10*3/uL (ref 0.0–0.1)
EOS ABS: 0 10*3/uL (ref 0.0–0.7)
Eosinophils Relative: 0 %
HCT: 35.3 % — ABNORMAL LOW (ref 36.0–46.0)
HEMOGLOBIN: 11.3 g/dL — AB (ref 12.0–15.0)
LYMPHS ABS: 1.4 10*3/uL (ref 0.7–4.0)
Lymphocytes Relative: 17 %
MCH: 29.1 pg (ref 26.0–34.0)
MCHC: 32 g/dL (ref 30.0–36.0)
MCV: 91 fL (ref 78.0–100.0)
MONO ABS: 0.7 10*3/uL (ref 0.1–1.0)
MONOS PCT: 9 %
Neutro Abs: 5.9 10*3/uL (ref 1.7–7.7)
Neutrophils Relative %: 74 %
Platelets: 155 10*3/uL (ref 150–400)
RBC: 3.88 MIL/uL (ref 3.87–5.11)
RDW: 14.1 % (ref 11.5–15.5)
WBC: 8 10*3/uL (ref 4.0–10.5)

## 2017-08-27 LAB — BASIC METABOLIC PANEL
Anion gap: 8 (ref 5–15)
BUN: 24 mg/dL — ABNORMAL HIGH (ref 6–20)
CO2: 29 mmol/L (ref 22–32)
CREATININE: 1.24 mg/dL — AB (ref 0.44–1.00)
Calcium: 8.9 mg/dL (ref 8.9–10.3)
Chloride: 101 mmol/L (ref 101–111)
GFR, EST AFRICAN AMERICAN: 42 mL/min — AB (ref >60)
GFR, EST NON AFRICAN AMERICAN: 37 mL/min — AB (ref >60)
Glucose, Bld: 125 mg/dL — ABNORMAL HIGH (ref 65–99)
Potassium: 3.8 mmol/L (ref 3.5–5.1)
SODIUM: 138 mmol/L (ref 135–145)

## 2017-08-27 MED ORDER — AMLODIPINE BESYLATE 5 MG PO TABS
5.0000 mg | ORAL_TABLET | Freq: Every day | ORAL | Status: DC
  Administered 2017-08-28 – 2017-08-30 (×3): 5 mg via ORAL
  Filled 2017-08-27 (×3): qty 1

## 2017-08-27 MED ORDER — HYDROCODONE-ACETAMINOPHEN 5-325 MG PO TABS
1.0000 | ORAL_TABLET | ORAL | Status: DC | PRN
  Administered 2017-08-27 – 2017-08-30 (×5): 1 via ORAL
  Filled 2017-08-27 (×6): qty 1

## 2017-08-27 MED ORDER — ALPRAZOLAM 0.25 MG PO TABS
0.2500 mg | ORAL_TABLET | Freq: Two times a day (BID) | ORAL | Status: DC
Start: 2017-08-27 — End: 2017-08-30
  Administered 2017-08-27 – 2017-08-30 (×6): 0.25 mg via ORAL
  Filled 2017-08-27 (×6): qty 1

## 2017-08-27 MED ORDER — TORSEMIDE 20 MG PO TABS
20.0000 mg | ORAL_TABLET | Freq: Every day | ORAL | Status: DC
  Administered 2017-08-28 – 2017-08-30 (×3): 20 mg via ORAL
  Filled 2017-08-27 (×3): qty 1

## 2017-08-27 MED ORDER — ONDANSETRON HCL 4 MG/2ML IJ SOLN: 4.0000 mg | Freq: Four times a day (QID) | INTRAMUSCULAR | Status: DC | PRN

## 2017-08-27 MED ORDER — SODIUM CHLORIDE 0.9 % IV SOLN: 250.0000 mL | INTRAVENOUS | Status: DC | PRN

## 2017-08-27 MED ORDER — IPRATROPIUM-ALBUTEROL 0.5-2.5 (3) MG/3ML IN SOLN
3.0000 mL | Freq: Four times a day (QID) | RESPIRATORY_TRACT | Status: DC
  Administered 2017-08-27: 3 mL via RESPIRATORY_TRACT
  Filled 2017-08-27: qty 3

## 2017-08-27 MED ORDER — METHIMAZOLE 5 MG PO TABS
2.5000 mg | ORAL_TABLET | ORAL | Status: DC
  Administered 2017-08-29: 2.5 mg via ORAL
  Filled 2017-08-27: qty 1

## 2017-08-27 MED ORDER — ACETAMINOPHEN 500 MG PO TABS: 1000.0000 mg | ORAL_TABLET | ORAL | Status: DC | PRN

## 2017-08-27 MED ORDER — FLUTICASONE PROPIONATE 50 MCG/ACT NA SUSP
1.0000 | Freq: Every day | NASAL | Status: DC
  Administered 2017-08-28 – 2017-08-30 (×3): 1 via NASAL
  Filled 2017-08-27: qty 16

## 2017-08-27 MED ORDER — SODIUM CHLORIDE 0.9% FLUSH
3.0000 mL | INTRAVENOUS | Status: DC | PRN
  Administered 2017-08-29: 3 mL via INTRAVENOUS
  Filled 2017-08-27: qty 3

## 2017-08-27 MED ORDER — SODIUM CHLORIDE 0.9% FLUSH
3.0000 mL | Freq: Two times a day (BID) | INTRAVENOUS | Status: DC
  Administered 2017-08-27 – 2017-08-30 (×6): 3 mL via INTRAVENOUS

## 2017-08-27 MED ORDER — ACETAMINOPHEN 325 MG PO TABS
650.0000 mg | ORAL_TABLET | Freq: Four times a day (QID) | ORAL | Status: DC | PRN
  Administered 2017-08-28 – 2017-08-29 (×2): 650 mg via ORAL
  Filled 2017-08-27 (×2): qty 2

## 2017-08-27 MED ORDER — HEPARIN SODIUM (PORCINE) 5000 UNIT/ML IJ SOLN
5000.0000 [IU] | Freq: Three times a day (TID) | INTRAMUSCULAR | Status: DC
  Administered 2017-08-27 – 2017-08-30 (×7): 5000 [IU] via SUBCUTANEOUS
  Filled 2017-08-27 (×8): qty 1

## 2017-08-27 MED ORDER — VITAMIN D 1000 UNITS PO TABS
2000.0000 [IU] | ORAL_TABLET | Freq: Every day | ORAL | Status: DC
  Administered 2017-08-28 – 2017-08-30 (×3): 2000 [IU] via ORAL
  Filled 2017-08-27 (×3): qty 2

## 2017-08-27 MED ORDER — ACETAMINOPHEN 650 MG RE SUPP: 650.0000 mg | Freq: Four times a day (QID) | RECTAL | Status: DC | PRN

## 2017-08-27 MED ORDER — ASPIRIN EC 81 MG PO TBEC
81.0000 mg | DELAYED_RELEASE_TABLET | Freq: Every day | ORAL | Status: DC
  Administered 2017-08-28 – 2017-08-30 (×3): 81 mg via ORAL
  Filled 2017-08-27 (×3): qty 1

## 2017-08-27 MED ORDER — POLYETHYLENE GLYCOL 3350 17 GM/SCOOP PO POWD
0.5000 | Freq: Once | ORAL | Status: AC
  Administered 2017-08-27: 127.5 g via ORAL
  Filled 2017-08-27: qty 255

## 2017-08-27 MED ORDER — TRAMADOL HCL 50 MG PO TABS: 50.0000 mg | ORAL_TABLET | Freq: Four times a day (QID) | ORAL | 0 refills | Status: DC | PRN

## 2017-08-27 MED ORDER — CEFAZOLIN SODIUM-DEXTROSE 1-4 GM/50ML-% IV SOLN
1.0000 g | Freq: Two times a day (BID) | INTRAVENOUS | Status: DC
  Administered 2017-08-28 – 2017-08-29 (×5): 1 g via INTRAVENOUS
  Filled 2017-08-27 (×7): qty 50

## 2017-08-27 MED ORDER — ALBUTEROL SULFATE (2.5 MG/3ML) 0.083% IN NEBU: 2.5000 mg | INHALATION_SOLUTION | RESPIRATORY_TRACT | Status: DC | PRN

## 2017-08-27 MED ORDER — ADULT MULTIVITAMIN W/MINERALS CH
1.0000 | ORAL_TABLET | Freq: Every day | ORAL | Status: DC
  Administered 2017-08-28 – 2017-08-30 (×3): 1 via ORAL
  Filled 2017-08-27 (×3): qty 1

## 2017-08-27 MED ORDER — ONDANSETRON HCL 4 MG PO TABS: 4.0000 mg | ORAL_TABLET | Freq: Four times a day (QID) | ORAL | Status: DC | PRN

## 2017-08-27 MED ORDER — SERTRALINE HCL 25 MG PO TABS
25.0000 mg | ORAL_TABLET | Freq: Every day | ORAL | Status: DC
  Administered 2017-08-27 – 2017-08-29 (×3): 25 mg via ORAL
  Filled 2017-08-27 (×3): qty 1

## 2017-08-27 MED ORDER — METHYLPREDNISOLONE SODIUM SUCC 125 MG IJ SOLR
60.0000 mg | Freq: Three times a day (TID) | INTRAMUSCULAR | Status: DC
  Administered 2017-08-27 – 2017-08-29 (×5): 60 mg via INTRAVENOUS
  Filled 2017-08-27 (×5): qty 2

## 2017-08-27 MED ORDER — HEPARIN SODIUM (PORCINE) 5000 UNIT/ML IJ SOLN: 5000.0000 [IU] | Freq: Three times a day (TID) | INTRAMUSCULAR | Status: DC

## 2017-08-27 NOTE — ED Notes (Signed)
Bed: [facility] Expected date:  Expected time:  Means of arrival:  Comments: EMS-fall-arm pain

## 2017-08-27 NOTE — ED Notes (Signed)
Patient requested the purewick catheter to be removed as it was causing burning.

## 2017-08-27 NOTE — ED Triage Notes (Signed)
Per [facility] pt from [facility] assisted [facility], fall, slipped left arm pain, obvious bruising,  Given 100 mcg Fentanyl total.  VS 130/66  O2 97 3 L [place] due to fentanyl.  Recent DX cellulitis bilateral lower extremities. , febrile per [facility]. On antibiotic x 4 days.

## 2017-08-27 NOTE — ED Notes (Signed)
Bed: [facility] Expected date: 08/27/17 Expected time:  Means of arrival:  Comments: Need to be deep cleaned.

## 2017-08-27 NOTE — Telephone Encounter (Signed)
Called and spoke with pt's daughter, Talbert Forest. Talbert Forest states pt had a fall last night. Pt reports of Left arm pain- unable to tell if arm is swollen due to not being able to remove pt's arm from sleeve. Talbert Forest states pt is able to move her fingers. Pt was given tylenol at 3:00am and is icing arm. Talbert Forest is requesting that an order for x-ray be sent to [facility].  SN please advise. Thanks.

## 2017-08-27 NOTE — H&P (Signed)
[facility]                                                                                    Patient Demographics  [name], is a 83 y.o. female  CSN: [id number]  MRN: [id number]  DOB - 12/13/1922  Admit Date - 08/27/2017  Outpatient Primary MD for the patient is Michele Mcalpine, MD   With History of -  Past Medical History:  Diagnosis Date  . Anemia, unspecified   . COPD (chronic obstructive pulmonary disease) (HCC)   . Diverticulosis of colon (without mention of hemorrhage)   . DJD (degenerative joint disease)   . GERD (gastroesophageal reflux disease)   . Lumbago   . Osteoarthrosis, unspecified whether generalized or localized, unspecified site   . Other abnormal glucose   . Other and unspecified hyperlipidemia   . Pyelonephritis   . Pyelonephritis, unspecified   . Right bundle branch block   . Shortness of breath   . Solitary cyst of breast   . Spinal stenosis, unspecified region other than cervical   . Unspecified essential hypertension   . UTI (lower urinary tract infection)       Past Surgical History:  Procedure Laterality Date  . CATARACT EXTRACTION    . conversion to right THR    . right hip hemiarthroplasty     total    in for   Chief Complaint  Patient presents with  . Fall  . Arm Injury    left arm  . Cellulitis    Bilateral Lower Extremities     HPI  [name]  is a 83 y.o. female,With past medical history significant for COPD, anemia who was sent from her sister living facility after a fall with trauma to left shoulder . When I saw the patient she was sleeping after pain medications and I could not get significant history. Family member at bedside KUB most of the information. The patient was found to have left proximal humerus fracture and she received Dilaudid earlier and she became more comfortable. However her oxygen started dropping and she is not usually on oxygen at home. The patient has been on treatment for  lower extremity cellulitis/stasis dermatitis with doxycycline. Her chest x-ray was normal and it was discussed with her pulmonologist was advised admission for observation at least and treatment.    Review of Systems    Could not evaluate due to patient's condition  Social History Social History  Substance Use Topics  . Smoking status: Never Smoker  . Smokeless tobacco: Never Used  . Alcohol use No     Family History Family History  Problem Relation Age of Onset  . Heart failure Father   . Cancer Brother   . Cancer Brother      Prior to Admission medications   Medication Sig Start Date End Date Taking? Authorizing Provider  acetaminophen (TYLENOL) 500 MG tablet Take 1,000 mg by mouth every 4 (four) hours as needed for mild pain.   Yes [provider]  acetaZOLAMIDE (DIAMOX) 250 MG tablet TAKE (1) TABLET BY MOUTH EACH MORNING. 07/26/17  Yes Michele Mcalpine, MD  ADVAIR DISKUS 100-50  MCG/DOSE AEPB INHALE 1 PUFF BY MOUTH TWICE DAILY. RINSE MOUTH AFTER USE TO PREVENT THRUSH. 07/30/17  Yes Michele Mcalpine, MD  albuterol (PROAIR HFA) 108 (90 Base) MCG/ACT inhaler USE 1 TO 2 PUFFS EVERY SIX HOURS AS NEEDED. Patient taking differently: Inhale 1-2 puffs into the lungs every 6 (six) hours as needed for wheezing or shortness of breath. USE 1 TO 2 PUFFS EVERY SIX HOURS AS NEEDED. 08/19/17  Yes Michele Mcalpine, MD  ALPRAZolam Prudy Feeler) 0.25 MG tablet Take 0.25 mg by mouth 2 (two) times daily.    Yes [provider]  amLODipine (NORVASC) 5 MG tablet TAKE ONE TABLET BY MOUTH ONCE DAILY. 01/22/17  Yes Michele Mcalpine, MD  ASPIRIN LOW DOSE 81 MG EC tablet TAKE ONE TABLET BY MOUTH ONCE DAILY. 06/12/16  Yes Michele Mcalpine, MD  Benzocaine (ANBESOL MT) Apply 1 application topically as needed (for tenderness around loose bridge).   Yes [provider]  cetirizine (ZYRTEC) 10 MG tablet TAKE 1 TABLET BY MOUTH ONCE DAILY FOR ALLERGIES. 08/01/17  Yes Michele Mcalpine, MD  Cholecalciferol  (VITAMIN D3) 2000 units capsule Take 1 capsule by mouth once daily 06/19/16  Yes Byrum, Les Pou, MD  doxycycline (VIBRA-TABS) 100 MG tablet Take 1 tablet (100 mg total) by mouth 2 (two) times daily. Patient taking differently: Take 100 mg by mouth 2 (two) times daily. Started 10/05 for 7 days 08/23/17  Yes Parrett, Tammy S, NP  fluticasone (FLONASE) 50 MCG/ACT nasal spray SPRAY 2 SPRAYS INTO EACH NOSTRIL ONCE DAILY. 03/16/16  Yes Bevelyn Ngo, NP  ipratropium-albuterol (DUONEB) 0.5-2.5 (3) MG/3ML SOLN Take 3 mLs by nebulization every 4 (four) hours as needed. Patient taking differently: Take 3 mLs by nebulization every 4 (four) hours as needed (for shortness of breath).  04/26/16  Yes Jeralyn Bennett, MD  methimazole (TAPAZOLE) 5 MG tablet TAKE 1/2 TABLET (2.5MG ) BY MOUTH EVERY OTHER DAY. 08/13/17  Yes Michele Mcalpine, MD  Multiple Vitamin (DAILY-VITE) TABS TAKE ONE TABLET BY MOUTH ONCE DAILY. 04/09/16  Yes Michele Mcalpine, MD  polyethylene glycol powder (GLYCOLAX/MIRALAX) powder MIX 1 CAPFUL (17G) IN 8 OUNCES OF JUICE/WATER AND DRINK ONCE DAILY ASNEEDED FOR CONSTIPATION. 06/13/16  Yes Michele Mcalpine, MD  ranitidine (ZANTAC) 150 MG tablet TAKE (1) TABLET BY MOUTH ONCE DAILY. 08/12/17  Yes Michele Mcalpine, MD  sertraline (ZOLOFT) 50 MG tablet 1/2 tablet by mouth daily Patient taking differently: Take 25 mg by mouth daily.  07/25/17  Yes Michele Mcalpine, MD  Spacer/Aero-Holding Chambers (AEROCHAMBER PLUS WITH MASK) inhaler Use as instructed 06/12/15  Yes Arby Barrette, MD  torsemide (DEMADEX) 20 MG tablet TAKE 20 MG BY MOUTH EVERY MORNING. 08/23/17  Yes Parrett, Tammy S, NP  albuterol (PROAIR HFA) 108 (90 Base) MCG/ACT inhaler USE 1 TO 2 PUFFS EVERY SIX HOURS AS NEEDED. Patient not taking: Reported on 08/27/2017 08/19/17   Michele Mcalpine, MD  Diphenhyd-Hydrocort-Nystatin (FIRST-DUKES MOUTHWASH) SUSP Gargle and swallow 1 tsp four times daily Patient not taking: Reported on 08/27/2017 06/19/17   Michele Mcalpine, MD   doxycycline (VIBRA-TABS) 100 MG tablet Take 1 tablet (100 mg total) by mouth 2 (two) times daily. Patient not taking: Reported on 08/27/2017 06/19/17   Michele Mcalpine, MD  traMADol (ULTRAM) 50 MG tablet Take 1 tablet (50 mg total) by mouth every 6 (six) hours as needed. 08/27/17   Arthor Captain, PA-C    Allergies  Allergen Reactions  . Azithromycin Shortness Of Breath  .  Ciprofloxacin Other (See Comments)     hallucinations  . Levofloxacin Other (See Comments)    Insomnia, indigestion, tingling sensation in legs  . Furosemide Rash    Bullous pemphigoid  . Latex Rash  . Other Rash    EKG leads caused a rash that required steroids to clear    Physical Exam  Vitals  Blood pressure (!) 119/58, pulse 70, temperature 97.6 F (36.4 C), temperature source Oral, resp. rate 15, SpO2 93 %.    1. General Well-developed well-nourished female in no acute distress at the moment  2. , sleepy.  3. No F.N deficits, grossly.  4. Ears and Eyes appear Normal, Conjunctivae clear, PERRLA. Moist Oral Mucosa.  5. Supple Neck, No JVD, No cervical lymphadenopathy appriciated, No Carotid Bruits.  6. Symmetrical Chest wall movement, decreased breath sounds bilaterally.  7. RRR, No Gallops, Rubs or Murmurs, No Parasternal Heave.  8. Positive Bowel Sounds, Abdomen Soft, Non tender, No organomegaly appreciated,No rebound -guarding or rigidity.  9.  No Cyanosis, Normal Skin Turgor, bilateral lower extremity dermatitis/cellulitis and redness.  10. Good muscle tone, left shoulder in a sling..    Data Review  CBC No results for input(s): WBC, HGB, HCT, PLT, MCV, MCH, MCHC, RDW, LYMPHSABS, MONOABS, EOSABS, BASOSABS, BANDABS in the last 168 hours.  Invalid input(s): NEUTRABS, BANDSABD ------------------------------------------------------------------------------------------------------------------  Chemistries  No results for input(s): NA, K, CL, CO2, GLUCOSE, BUN, CREATININE, CALCIUM, MG, AST,  ALT, ALKPHOS, BILITOT in the last 168 hours.  Invalid input(s): GFRCGP ------------------------------------------------------------------------------------------------------------------ CrCl cannot be calculated (Patient's most recent lab result is older than the maximum 21 days allowed.). ------------------------------------------------------------------------------------------------------------------ No results for input(s): TSH, T4TOTAL, T3FREE, THYROIDAB in the last 72 hours.  Invalid input(s): FREET3   Coagulation profile No results for input(s): INR, PROTIME in the last 168 hours. ------------------------------------------------------------------------------------------------------------------- No results for input(s): DDIMER in the last 72 hours. -------------------------------------------------------------------------------------------------------------------  Cardiac Enzymes No results for input(s): CKMB, TROPONINI, MYOGLOBIN in the last 168 hours.  Invalid input(s): CK ------------------------------------------------------------------------------------------------------------------ Invalid input(s): POCBNP   ---------------------------------------------------------------------------------------------------------------  Urinalysis    Component Value Date/Time   COLORURINE YELLOW 04/27/2016 1127   APPEARANCEUR CLEAR 04/27/2016 1127   LABSPEC 1.010 04/27/2016 1127   PHURINE 7.5 04/27/2016 1127   GLUCOSEU NEGATIVE 04/27/2016 1127   GLUCOSEU NEGATIVE 04/20/2016 1513   HGBUR NEGATIVE 04/27/2016 1127   BILIRUBINUR NEGATIVE 04/27/2016 1127   KETONESUR NEGATIVE 04/27/2016 1127   PROTEINUR NEGATIVE 04/27/2016 1127   UROBILINOGEN 0.2 04/20/2016 1513   NITRITE NEGATIVE 04/27/2016 1127   LEUKOCYTESUR NEGATIVE 04/27/2016 1127    ----------------------------------------------------------------------------------------------------------------   Imaging results:   Dg Chest 2  View  Result Date: 08/27/2017 CLINICAL DATA:  Status post fall last night. Severe left shoulder and left arm pain. Hypoxia. History of cardiac dysrhythmia, borderline diabetes, and COPD. EXAM: CHEST  2 VIEW COMPARISON:  Chest x-ray of May 09, 2016 FINDINGS: There is chronic elevation of the right hemidiaphragm. The lungs are mildly hypoinflated. There is no focal infiltrate. There is no pleural effusion. The heart is top-normal in size. The pulmonary vascularity is not engorged. There is calcification in the wall of the aortic arch. There is no pleural effusion. There is an acute mildly displaced fracture of the left humeral head and neck. The left clavicle is intact where visualized. IMPRESSION: Impacted, mildly displaced fracture of the left humeral head. Mild pulmonary hypoinflation. No CHF nor pneumonia. Chronic elevation of the right hemidiaphragm. Thoracic aortic atherosclerosis. Electronically Signed   By: David  [place] M.D.   On:  08/27/2017 12:09   Dg Shoulder Left  Result Date: 08/27/2017 CLINICAL DATA:  Shoulder pain. EXAM: LEFT SHOULDER - 2+ VIEW COMPARISON:  No prior . FINDINGS: None does scratched it slightly angulated fracture of the proximal left humeral metaphysis noted. No evidence of dislocation. Degenerative changes left shoulder . IMPRESSION: Slightly angulated fracture of the proximal left humeral metaphysis noted . Electronically Signed   By: Maisie Fus  Register   On: 08/27/2017 12:09   Dg Humerus Left  Result Date: 08/27/2017 CLINICAL DATA:  Fall.  Pain . EXAM: LEFT HUMERUS - 2+ VIEW COMPARISON:  No recent prior . FINDINGS: Slightly displaced fracture of the proximal left humeral metaphysis noted. Remainder of the left humerus is intact. IMPRESSION: Slightly displaced fracture of the proximal left humeral metaphysis. Electronically Signed   By: Maisie Fus  Register   On: 08/27/2017 12:09      Assessment & Plan  1. New onset hypoxemia with negative chest x-ray. Multifactorial  probably due to pain medications, history of COPD and left shoulder fracture 2. Fracture of the left proximal humerus 3. History ofCOPD 4. History of anemia 5. Lower extremity cellulitis/stasis dermatitis  Plan  Admit to hospital Neb treatments Steroids IV Pain control Consult ortho IV antibiotics for cellulitis of lower extremities   DVT Prophylaxis Heparin  AM Labs Ordered, also please review Full Orders    Code Stfull code  Disposition Plan:  back to nursing home  Time spent in minutes : 42 minutes  Condition GUARDED   @

## 2017-08-27 NOTE — ED Provider Notes (Signed)
[facility]-EMERGENCY DEPT Provider Note   CSN: [id number] Arrival date & time: 08/27/17  1039     History   Chief Complaint Chief Complaint  Patient presents with  . Fall  . Arm Injury    left arm  . Cellulitis    Bilateral Lower Extremities    HPI [name] is a 83 y.o. female who presents emergency Department via EMS from her nursing facility after her fall. She is attended by her daughter. Around 3:30 in the morning the patient fell in her bathroom. She had immediate pain in the left upper extremity was unable to move it. She states that she didn't think it was broken and did not want further evaluation at that time. They give her Tylenol and placed ice on her extremity. Later the patient's pain began to increase with significant swelling and guarding. She was sent in for evaluation. She is currently on antibiotics for bilateral lower extremity cellulitis which is worse on the right. Likely secondary to chronic venous stasis dermatitis. Patient has not been having any fevers or chills. She did not hit her head or lose consciousness. She is given some pain medication in the ambulance prior to arrival in the ED.  HPI  Past Medical History:  Diagnosis Date  . Anemia, unspecified   . COPD (chronic obstructive pulmonary disease) (HCC)   . Diverticulosis of colon (without mention of hemorrhage)   . DJD (degenerative joint disease)   . GERD (gastroesophageal reflux disease)   . Lumbago   . Osteoarthrosis, unspecified whether generalized or localized, unspecified site   . Other abnormal glucose   . Other and unspecified hyperlipidemia   . Pyelonephritis   . Pyelonephritis, unspecified   . Right bundle branch block   . Shortness of breath   . Solitary cyst of breast   . Spinal stenosis, unspecified region other than cervical   . Unspecified essential hypertension   . UTI (lower urinary tract infection)     Patient Active Problem List   Diagnosis Date Noted  . Cellulitis  08/23/2017  . Cough 05/09/2016  . Dysphagia 05/09/2016  . Esophageal dysmotility 05/09/2016  . Stage 1 decubitus ulcer 05/01/2016  . Respiratory failure (HCC) 04/27/2016  . Pressure ulcer 04/24/2016  . Hypoxia   . Acute respiratory failure with hypoxia (HCC) 04/23/2016  . HCAP (healthcare-associated pneumonia): Probable 04/23/2016  . COPD exacerbation (HCC) 04/23/2016  . Dehydration 04/23/2016  . CKD (chronic kidney disease), stage III (HCC) 04/23/2016  . Headache   . Allergic rhinitis 01/11/2016  . Thyrotoxicosis 12/07/2015  . Fatigue 10-01-2016  . Insomnia 10-01-2016  . Choking sensation 12/09/2014  . Rash 12/09/2014  . COPD (chronic obstructive pulmonary disease) (HCC) 11/17/2014  . Chronic venous insufficiency 04/14/2014  . Abnormality of gait 08/17/2013  . Acute URI 07/29/2012  . GERD (gastroesophageal reflux disease) 09/12/2011  . Neck pain 06/18/2011  . Anxiety 05/14/2011  . Acute bronchitis 05/02/2011  . ONYCHOMYCOSIS 12/13/2008  . UTI 12/13/2008  . Bullous pemphigoid 12/13/2008  . EDEMA 10/21/2008  . Diverticulosis of large intestine 12/01/2007  . Constipation 12/01/2007  . PYELONEPHRITIS 12/01/2007  . BREAST CYST 12/01/2007  . Osteoarthritis 12/01/2007  . DYSPNEA 12/01/2007  . Spinal stenosis of lumbar region 10/28/2007  . Elevated lipids 10/27/2007  . ANEMIA 10/27/2007  . Essential hypertension 10/27/2007  . RIGHT BUNDLE BRANCH BLOCK 10/27/2007  . LOW BACK PAIN SYNDROME 10/27/2007  . DIABETES MELLITUS, BORDERLINE 10/27/2007    Past Surgical History:  Procedure Laterality Date  .  CATARACT EXTRACTION    . conversion to right THR    . right hip hemiarthroplasty     total    OB History    No data available       Home Medications    Prior to Admission medications   Medication Sig Start Date End Date Taking? Authorizing Provider  acetaminophen (TYLENOL) 500 MG tablet Take 1,000 mg by mouth every 4 (four) hours as needed for mild pain.   Yes  [provider]  acetaZOLAMIDE (DIAMOX) 250 MG tablet TAKE (1) TABLET BY MOUTH EACH MORNING. 07/26/17  Yes Michele Mcalpine, MD  ADVAIR DISKUS 100-50 MCG/DOSE AEPB INHALE 1 PUFF BY MOUTH TWICE DAILY. RINSE MOUTH AFTER USE TO PREVENT THRUSH. 07/30/17  Yes Michele Mcalpine, MD  albuterol (PROAIR HFA) 108 (90 Base) MCG/ACT inhaler USE 1 TO 2 PUFFS EVERY SIX HOURS AS NEEDED. Patient taking differently: Inhale 1-2 puffs into the lungs every 6 (six) hours as needed for wheezing or shortness of breath. USE 1 TO 2 PUFFS EVERY SIX HOURS AS NEEDED. 08/19/17  Yes Michele Mcalpine, MD  ALPRAZolam Prudy Feeler) 0.25 MG tablet Take 0.25 mg by mouth 2 (two) times daily.    Yes [provider]  amLODipine (NORVASC) 5 MG tablet TAKE ONE TABLET BY MOUTH ONCE DAILY. 01/22/17  Yes Michele Mcalpine, MD  ASPIRIN LOW DOSE 81 MG EC tablet TAKE ONE TABLET BY MOUTH ONCE DAILY. 06/12/16  Yes Michele Mcalpine, MD  Benzocaine (ANBESOL MT) Apply 1 application topically as needed (for tenderness around loose bridge).   Yes [provider]  cetirizine (ZYRTEC) 10 MG tablet TAKE 1 TABLET BY MOUTH ONCE DAILY FOR ALLERGIES. 08/01/17  Yes Michele Mcalpine, MD  Cholecalciferol (VITAMIN D3) 2000 units capsule Take 1 capsule by mouth once daily 06/19/16  Yes Byrum, Les Pou, MD  doxycycline (VIBRA-TABS) 100 MG tablet Take 1 tablet (100 mg total) by mouth 2 (two) times daily. Patient taking differently: Take 100 mg by mouth 2 (two) times daily. Started 10/05 for 7 days 08/23/17  Yes Parrett, Tammy S, NP  fluticasone (FLONASE) 50 MCG/ACT nasal spray SPRAY 2 SPRAYS INTO EACH NOSTRIL ONCE DAILY. 03/16/16  Yes Bevelyn Ngo, NP  ipratropium-albuterol (DUONEB) 0.5-2.5 (3) MG/3ML SOLN Take 3 mLs by nebulization every 4 (four) hours as needed. Patient taking differently: Take 3 mLs by nebulization every 4 (four) hours as needed (for shortness of breath).  04/26/16  Yes Jeralyn Bennett, MD  methimazole (TAPAZOLE) 5 MG tablet TAKE 1/2 TABLET (2.5MG )  BY MOUTH EVERY OTHER DAY. 08/13/17  Yes Michele Mcalpine, MD  Multiple Vitamin (DAILY-VITE) TABS TAKE ONE TABLET BY MOUTH ONCE DAILY. 04/09/16  Yes Michele Mcalpine, MD  polyethylene glycol powder (GLYCOLAX/MIRALAX) powder MIX 1 CAPFUL (17G) IN 8 OUNCES OF JUICE/WATER AND DRINK ONCE DAILY ASNEEDED FOR CONSTIPATION. 06/13/16  Yes Michele Mcalpine, MD  ranitidine (ZANTAC) 150 MG tablet TAKE (1) TABLET BY MOUTH ONCE DAILY. 08/12/17  Yes Michele Mcalpine, MD  sertraline (ZOLOFT) 50 MG tablet 1/2 tablet by mouth daily Patient taking differently: Take 25 mg by mouth daily.  07/25/17  Yes Michele Mcalpine, MD  Spacer/Aero-Holding Chambers (AEROCHAMBER PLUS WITH MASK) inhaler Use as instructed 06/12/15  Yes Arby Barrette, MD  torsemide (DEMADEX) 20 MG tablet TAKE 20 MG BY MOUTH EVERY MORNING. 08/23/17  Yes Parrett, Tammy S, NP  albuterol (PROAIR HFA) 108 (90 Base) MCG/ACT inhaler USE 1 TO 2 PUFFS EVERY SIX HOURS AS NEEDED.  Patient not taking: Reported on 08/27/2017 08/19/17   Michele Mcalpine, MD  Diphenhyd-Hydrocort-Nystatin (FIRST-DUKES MOUTHWASH) SUSP Gargle and swallow 1 tsp four times daily Patient not taking: Reported on 08/27/2017 06/19/17   Michele Mcalpine, MD  doxycycline (VIBRA-TABS) 100 MG tablet Take 1 tablet (100 mg total) by mouth 2 (two) times daily. Patient not taking: Reported on 08/27/2017 06/19/17   Michele Mcalpine, MD  traMADol (ULTRAM) 50 MG tablet Take 1 tablet (50 mg total) by mouth every 6 (six) hours as needed. 08/27/17   Arthor Captain, PA-C    Family History Family History  Problem Relation Age of Onset  . Heart failure Father   . Cancer Brother   . Cancer Brother     Social History Social History  Substance Use Topics  . Smoking status: Never Smoker  . Smokeless tobacco: Never Used  . Alcohol use No     Allergies   Azithromycin; Ciprofloxacin; Levofloxacin; Furosemide; Latex; and Other   Review of Systems Review of Systems Ten systems reviewed and are negative for acute change, except  as noted in the HPI.    Physical Exam Updated Vital Signs BP (!) 119/58   Pulse 70   Temp 97.6 F (36.4 C) (Oral)   Resp 15   SpO2 93%   Physical Exam  Constitutional: She is oriented to person, place, and time. She appears well-developed and well-nourished. No distress.  HENT:  Head: Normocephalic and atraumatic.  Eyes: Conjunctivae are normal. No scleral icterus.  Neck: Normal range of motion.  Cardiovascular: Normal rate, regular rhythm and normal heart sounds.  Exam reveals no gallop and no friction rub.   No murmur heard. Pulmonary/Chest: Effort normal and breath sounds normal. No respiratory distress.  Abdominal: Soft. Bowel sounds are normal. She exhibits no distension and no mass. There is no tenderness. There is no guarding.  Musculoskeletal:  Patient examined in a sling fashioned from cough. She has deformity of the left shoulder with marked bruising and swelling over the proximal humerus. As I removed the patient's wedding ring. She has strong distal pulses is able to wiggle her fingers, no bony tenderness on examination of the elbow. Range of motion is deferred secondary to clinical fracture.  Neurological: She is alert and oriented to person, place, and time.  Skin: Skin is warm and dry. She is not diaphoretic.  Psychiatric: Her behavior is normal.  Nursing note and vitals reviewed.    ED Treatments / Results  Labs (all labs ordered are listed, but only abnormal results are displayed) Labs Reviewed  CBC WITH DIFFERENTIAL/PLATELET - Abnormal; Notable for the following:       Result Value   Hemoglobin 11.3 (*)    HCT 35.3 (*)    All other components within normal limits  BASIC METABOLIC PANEL - Abnormal; Notable for the following:    Glucose, Bld 125 (*)    BUN 24 (*)    Creatinine, Ser 1.24 (*)    GFR calc non Af Amer 37 (*)    GFR calc Af Amer 42 (*)    All other components within normal limits  URINALYSIS, ROUTINE W REFLEX MICROSCOPIC    EKG  EKG  Interpretation None       Radiology Dg Chest 2 View  Result Date: 08/27/2017 CLINICAL DATA:  Status post fall last night. Severe left shoulder and left arm pain. Hypoxia. History of cardiac dysrhythmia, borderline diabetes, and COPD. EXAM: CHEST  2 VIEW COMPARISON:  Chest x-ray of May 09, 2016 FINDINGS: There is chronic elevation of the right hemidiaphragm. The lungs are mildly hypoinflated. There is no focal infiltrate. There is no pleural effusion. The heart is top-normal in size. The pulmonary vascularity is not engorged. There is calcification in the wall of the aortic arch. There is no pleural effusion. There is an acute mildly displaced fracture of the left humeral head and neck. The left clavicle is intact where visualized. IMPRESSION: Impacted, mildly displaced fracture of the left humeral head. Mild pulmonary hypoinflation. No CHF nor pneumonia. Chronic elevation of the right hemidiaphragm. Thoracic aortic atherosclerosis. Electronically Signed   By: David  [place] M.D.   On: 08/27/2017 12:09   Dg Shoulder Left  Result Date: 08/27/2017 CLINICAL DATA:  Shoulder pain. EXAM: LEFT SHOULDER - 2+ VIEW COMPARISON:  No prior . FINDINGS: None does scratched it slightly angulated fracture of the proximal left humeral metaphysis noted. No evidence of dislocation. Degenerative changes left shoulder . IMPRESSION: Slightly angulated fracture of the proximal left humeral metaphysis noted . Electronically Signed   By: Maisie Fus  Register   On: 08/27/2017 12:09   Dg Humerus Left  Result Date: 08/27/2017 CLINICAL DATA:  Fall.  Pain . EXAM: LEFT HUMERUS - 2+ VIEW COMPARISON:  No recent prior . FINDINGS: Slightly displaced fracture of the proximal left humeral metaphysis noted. Remainder of the left humerus is intact. IMPRESSION: Slightly displaced fracture of the proximal left humeral metaphysis. Electronically Signed   By: Maisie Fus  Register   On: 08/27/2017 12:09    Procedures Procedures (including critical  care time)  Medications Ordered in ED Medications - No data to display   Initial Impression / Assessment and Plan / ED Course  I have reviewed the triage vital signs and the nursing notes.  Pertinent labs & imaging results that were available during my care of the patient were reviewed by me and considered in my medical decision making (see chart for details).     Patient with proximal left humeral fracture. Placed in sling immobilizer. I discussed the case with Dr. Fidela Juneau down who is her pulmonologist as the patient has has persistent hypoxia. She has no previous history of such and is hypoxic and wrist when off of her nasal cannula. He feels there may be a VQ mismatch secondary to the mild atelectasis on her chest x-ray and fentanyl. She may need further workup but will require observation and admission. Dr. Sharyon Medicus will admit. She is stable through her ER visit. I have given signed out to PA street to assume care as her labs are pending  Final Clinical Impressions(s) / ED Diagnoses   Final diagnoses:  Other closed displaced fracture of proximal end of left humerus, initial encounter  Anemia, unspecified type  Hypoxia    New Prescriptions New Prescriptions   TRAMADOL (ULTRAM) 50 MG TABLET    Take 1 tablet (50 mg total) by mouth every 6 (six) hours as needed.     Arthor Captain, PA-C 08/27/17 1658    Rolland Porter, MD 09/03/17 0900

## 2017-08-27 NOTE — ED Notes (Signed)
ED TO INPATIENT HANDOFF REPORT  Name/Age/Gender [name] 83 y.o. female  Code Status Code Status History    Date Active Date Inactive Code Status Order ID Comments User Context   04/27/2016  2:19 PM 05/01/2016  6:31 PM Full Code [id number]  Bonnell Public, MD ED   04/23/2016  6:47 PM 04/26/2016  9:25 PM Full Code [id number]  Eugenie Filler, MD Inpatient   04/23/2016  5:01 PM 04/23/2016  6:47 PM Full Code [id number]  Eugenie Filler, MD ED      Home/SNF/Other Nursing Home  Chief Complaint fall   Level of Care/Admitting Diagnosis ED Disposition    ED Disposition Condition [facility] Area: [facility] [[id number]]  Level of Care: Med-Surg [16]  Diagnosis: Hypoxemia [799.02.ICD-9-CM]  Admitting Physician: Merton Border [[id number]]  Attending Physician: Laren Everts, [place]  Estimated length of stay: past midnight tomorrow  Certification:: I certify this patient will need inpatient services for at least 2 midnights  PT Class (Do Not Modify): Inpatient [101]  PT Acc Code (Do Not Modify): Private [1]       Medical History Past Medical History:  Diagnosis Date  . Anemia, unspecified   . COPD (chronic obstructive pulmonary disease) ([place])   . Diverticulosis of colon (without mention of hemorrhage)   . DJD (degenerative joint disease)   . GERD (gastroesophageal reflux disease)   . Lumbago   . Osteoarthrosis, unspecified whether generalized or localized, unspecified site   . Other abnormal glucose   . Other and unspecified hyperlipidemia   . Pyelonephritis   . Pyelonephritis, unspecified   . Right bundle branch block   . Shortness of breath   . Solitary cyst of breast   . Spinal stenosis, unspecified region other than cervical   . Unspecified essential hypertension   . UTI (lower urinary tract infection)     Allergies Allergies  Allergen Reactions  . Azithromycin Shortness Of Breath  . Ciprofloxacin Other (See Comments)   hallucinations  . Levofloxacin Other (See Comments)    Insomnia, indigestion, tingling sensation in legs  . Furosemide Rash    Bullous pemphigoid  . Latex Rash  . Other Rash    EKG leads caused a rash that required steroids to clear    IV Location/Drains/Wounds Patient Lines/Drains/Airways Status   Active Line/Drains/Airways    Name:   Placement date:   Placement time:   Site:   Days:   Peripheral IV 08/27/17 Left Hand  08/27/17    1054    Hand    less than 1   Pressure Ulcer 04/23/16 Stage I -  Intact skin with non-blanchable redness of a localized area usually over a bony prominence. redness with healing scars  04/23/16    1912      491          Labs/Imaging Results for orders placed or performed during the hospital encounter of 08/27/17 (from the past 48 hour(s))  CBC with Differential/Platelet     Status: Abnormal   Collection Time: 08/27/17  3:39 PM  Result Value Ref Range   WBC 8.0 4.0 - 10.5 K/uL   RBC 3.88 3.87 - 5.11 MIL/uL   Hemoglobin 11.3 (L) 12.0 - 15.0 g/dL   HCT 35.3 (L) 36.0 - 46.0 %   MCV 91.0 78.0 - 100.0 fL   MCH 29.1 26.0 - 34.0 pg   MCHC 32.0 30.0 - 36.0 g/dL   RDW 14.1 11.5 - 15.5 %  Platelets 155 150 - 400 K/uL   Neutrophils Relative % 74 %   Neutro Abs 5.9 1.7 - 7.7 K/uL   Lymphocytes Relative 17 %   Lymphs Abs 1.4 0.7 - 4.0 K/uL   Monocytes Relative 9 %   Monocytes Absolute 0.7 0.1 - 1.0 K/uL   Eosinophils Relative 0 %   Eosinophils Absolute 0.0 0.0 - 0.7 K/uL   Basophils Relative 0 %   Basophils Absolute 0.0 0.0 - 0.1 K/uL  Basic metabolic panel     Status: Abnormal   Collection Time: 08/27/17  3:39 PM  Result Value Ref Range   Sodium 138 135 - 145 mmol/L   Potassium 3.8 3.5 - 5.1 mmol/L   Chloride 101 101 - 111 mmol/L   CO2 29 22 - 32 mmol/L   Glucose, Bld 125 (H) 65 - 99 mg/dL   BUN 24 (H) 6 - 20 mg/dL   Creatinine, Ser 1.24 (H) 0.44 - 1.00 mg/dL   Calcium 8.9 8.9 - 10.3 mg/dL   GFR calc non Af Amer 37 (L) >60 mL/min   GFR calc Af  Amer 42 (L) >60 mL/min    Comment: (NOTE) The eGFR has been calculated using the CKD EPI equation. This calculation has not been validated in all clinical situations. eGFR's persistently <60 mL/min signify possible Chronic Kidney Disease.    Anion gap 8 5 - 15  Urinalysis, Routine w reflex microscopic     Status: None   Collection Time: 08/27/17  5:05 PM  Result Value Ref Range   Color, Urine YELLOW YELLOW   APPearance CLEAR CLEAR   Specific Gravity, Urine 1.015 1.005 - 1.030   pH 6.0 5.0 - 8.0   Glucose, UA NEGATIVE NEGATIVE mg/dL   Hgb urine dipstick NEGATIVE NEGATIVE   Bilirubin Urine NEGATIVE NEGATIVE   Ketones, ur NEGATIVE NEGATIVE mg/dL   Protein, ur NEGATIVE NEGATIVE mg/dL   Nitrite NEGATIVE NEGATIVE   Leukocytes, UA NEGATIVE NEGATIVE    Comment: Microscopic not done on urines with negative protein, blood, leukocytes, nitrite, or glucose < 500 mg/dL.   Dg Chest 2 View  Result Date: 08/27/2017 CLINICAL DATA:  Status post fall last night. Severe left shoulder and left arm pain. Hypoxia. History of cardiac dysrhythmia, borderline diabetes, and COPD. EXAM: CHEST  2 VIEW COMPARISON:  Chest x-ray of May 09, 2016 FINDINGS: There is chronic elevation of the right hemidiaphragm. The lungs are mildly hypoinflated. There is no focal infiltrate. There is no pleural effusion. The heart is top-normal in size. The pulmonary vascularity is not engorged. There is calcification in the wall of the aortic arch. There is no pleural effusion. There is an acute mildly displaced fracture of the left humeral head and neck. The left clavicle is intact where visualized. IMPRESSION: Impacted, mildly displaced fracture of the left humeral head. Mild pulmonary hypoinflation. No CHF nor pneumonia. Chronic elevation of the right hemidiaphragm. Thoracic aortic atherosclerosis. Electronically Signed   By: David  [place] M.D.   On: 08/27/2017 12:09   Dg Shoulder Left  Result Date: 08/27/2017 CLINICAL DATA:   Shoulder pain. EXAM: LEFT SHOULDER - 2+ VIEW COMPARISON:  No prior . FINDINGS: None does scratched it slightly angulated fracture of the proximal left humeral metaphysis noted. No evidence of dislocation. Degenerative changes left shoulder . IMPRESSION: Slightly angulated fracture of the proximal left humeral metaphysis noted . Electronically Signed   By: Marcello Moores  Register   On: 08/27/2017 12:09   Dg Humerus Left  Result Date: 08/27/2017 CLINICAL  DATA:  Fall.  Pain . EXAM: LEFT HUMERUS - 2+ VIEW COMPARISON:  No recent prior . FINDINGS: Slightly displaced fracture of the proximal left humeral metaphysis noted. Remainder of the left humerus is intact. IMPRESSION: Slightly displaced fracture of the proximal left humeral metaphysis. Electronically Signed   By: Marcello Moores  Register   On: 08/27/2017 12:09    Pending Labs [employer]    Start     Ordered   Signed and Held  Basic metabolic panel  Tomorrow morning,   R     Signed and Held      Vitals/Pain Today's Vitals   08/27/17 1700 08/27/17 1701 08/27/17 1730 08/27/17 1800  BP: 129/73 129/73 (!) 128/57 (!) 133/53  Pulse: 74 73 66 69  Resp:  18    Temp:      TempSrc:      SpO2: 94% 94% 92% 94%    Isolation Precautions No active isolations  Medications Medications - No data to display  Mobility non-ambulatory

## 2017-08-27 NOTE — ED Notes (Signed)
Per family request patient placed on purewick catheter.

## 2017-08-27 NOTE — Telephone Encounter (Signed)
Per SN---   She will need to go to the ER for proper assessment of her entire arm and shoulder  Or call her ortho doctor and get her in there.  thanks

## 2017-08-27 NOTE — Progress Notes (Signed)
Pharmacy Antibiotic Note  [name] is a 83 y.o. female admitted on 08/27/2017 with cellulitis.  Pharmacy has been consulted for cefazolin dosing. Per H&P, LE cellulitis vs stasis dermatitis (On doxycycline prior to admission, started 10/5 with duration of 7-days)   Today, 08/27/2017  Renal: SCr mildly elevated  WBC WNL  Afebrile  Plan:  Cefazolin 1gm IV q12h based on current CrCl  Follow length of therapy  Stop if stasis dermatitis >> infection   Temp (24hrs), Avg:97.6 F (36.4 C), Min:97.6 F (36.4 C), Max:97.6 F (36.4 C)   Recent Labs Lab 08/27/17 1539  WBC 8.0  CREATININE 1.24*    Estimated Creatinine Clearance: 33.1 mL/min (A) (by C-G formula based on SCr of 1.24 mg/dL (H)).    Allergies  Allergen Reactions  . Azithromycin Shortness Of Breath  . Ciprofloxacin Other (See Comments)     hallucinations  . Levofloxacin Other (See Comments)    Insomnia, indigestion, tingling sensation in legs  . Furosemide Rash    Bullous pemphigoid  . Latex Rash  . Other Rash    EKG leads caused a rash that required steroids to clear    Antimicrobials this admission: PTA PO doxy  Dose adjustments this admission:   Microbiology results: none  Thank you for allowing pharmacy to be a part of this patient's care.  Juliette Alcide, PharmD, BCPS.   Pager: [phone] 08/27/2017 6:17 PM

## 2017-08-27 NOTE — ED Provider Notes (Signed)
Care assumed from [place], [place], at shift change, please see their notes for full documentation of patient's complaint/HPI. Briefly, pt here with fall. Results so far show slightly angulated displaced proximal L humeral metaphysis fx. However pt found to be hypoxic which is new. Huntley Dec PA-C has already spoken with Dr. Sharyon Medicus of [facility] who will be admitting patient, however we are awaiting labs, so I was asked to ensure she ultimately got admission orders placed by the hospitalist service.    Physical Exam  BP (!) 119/58   Pulse 70   Temp 97.6 F (36.4 C) (Oral)   Resp 15   SpO2 93%   Physical Exam  ED Course  Procedures Results for orders placed or performed during the hospital encounter of 08/27/17  CBC with Differential/Platelet  Result Value Ref Range   WBC 8.0 4.0 - 10.5 K/uL   RBC 3.88 3.87 - 5.11 MIL/uL   Hemoglobin 11.3 (L) 12.0 - 15.0 g/dL   HCT 16.1 (L) 09.6 - 04.5 %   MCV 91.0 78.0 - 100.0 fL   MCH 29.1 26.0 - 34.0 pg   MCHC 32.0 30.0 - 36.0 g/dL   RDW 40.9 81.1 - 91.4 %   Platelets 155 150 - 400 K/uL   Neutrophils Relative % 74 %   Neutro Abs 5.9 1.7 - 7.7 K/uL   Lymphocytes Relative 17 %   Lymphs Abs 1.4 0.7 - 4.0 K/uL   Monocytes Relative 9 %   Monocytes Absolute 0.7 0.1 - 1.0 K/uL   Eosinophils Relative 0 %   Eosinophils Absolute 0.0 0.0 - 0.7 K/uL   Basophils Relative 0 %   Basophils Absolute 0.0 0.0 - 0.1 K/uL  Basic metabolic panel  Result Value Ref Range   Sodium 138 135 - 145 mmol/L   Potassium 3.8 3.5 - 5.1 mmol/L   Chloride 101 101 - 111 mmol/L   CO2 29 22 - 32 mmol/L   Glucose, Bld 125 (H) 65 - 99 mg/dL   BUN 24 (H) 6 - 20 mg/dL   Creatinine, Ser 7.82 (H) 0.44 - 1.00 mg/dL   Calcium 8.9 8.9 - 95.6 mg/dL   GFR calc non Af Amer 37 (L) >60 mL/min   GFR calc Af Amer 42 (L) >60 mL/min   Anion gap 8 5 - 15  Urinalysis, Routine w reflex microscopic  Result Value Ref Range   Color, Urine YELLOW YELLOW   APPearance CLEAR CLEAR   Specific Gravity,  Urine 1.015 1.005 - 1.030   pH 6.0 5.0 - 8.0   Glucose, UA NEGATIVE NEGATIVE mg/dL   Hgb urine dipstick NEGATIVE NEGATIVE   Bilirubin Urine NEGATIVE NEGATIVE   Ketones, ur NEGATIVE NEGATIVE mg/dL   Protein, ur NEGATIVE NEGATIVE mg/dL   Nitrite NEGATIVE NEGATIVE   Leukocytes, UA NEGATIVE NEGATIVE   Dg Chest 2 View  Result Date: 08/27/2017 CLINICAL DATA:  Status post fall last night. Severe left shoulder and left arm pain. Hypoxia. History of cardiac dysrhythmia, borderline diabetes, and COPD. EXAM: CHEST  2 VIEW COMPARISON:  Chest x-ray of May 09, 2016 FINDINGS: There is chronic elevation of the right hemidiaphragm. The lungs are mildly hypoinflated. There is no focal infiltrate. There is no pleural effusion. The heart is top-normal in size. The pulmonary vascularity is not engorged. There is calcification in the wall of the aortic arch. There is no pleural effusion. There is an acute mildly displaced fracture of the left humeral head and neck. The left clavicle is intact where  visualized. IMPRESSION: Impacted, mildly displaced fracture of the left humeral head. Mild pulmonary hypoinflation. No CHF nor pneumonia. Chronic elevation of the right hemidiaphragm. Thoracic aortic atherosclerosis. Electronically Signed   By: David  Swaziland M.D.   On: 08/27/2017 12:09   Dg Shoulder Left  Result Date: 08/27/2017 CLINICAL DATA:  Shoulder pain. EXAM: LEFT SHOULDER - 2+ VIEW COMPARISON:  No prior . FINDINGS: None does scratched it slightly angulated fracture of the proximal left humeral metaphysis noted. No evidence of dislocation. Degenerative changes left shoulder . IMPRESSION: Slightly angulated fracture of the proximal left humeral metaphysis noted . Electronically Signed   By: Maisie Fus  Register   On: 08/27/2017 12:09   Dg Humerus Left  Result Date: 08/27/2017 CLINICAL DATA:  Fall.  Pain . EXAM: LEFT HUMERUS - 2+ VIEW COMPARISON:  No recent prior . FINDINGS: Slightly displaced fracture of the proximal left  humeral metaphysis noted. Remainder of the left humerus is intact. IMPRESSION: Slightly displaced fracture of the proximal left humeral metaphysis. Electronically Signed   By: Maisie Fus  Register   On: 08/27/2017 12:09     Meds ordered this encounter  Medications  . Benzocaine (ANBESOL MT)    Sig: Apply 1 application topically as needed (for tenderness around loose bridge).  [name] acetaminophen (TYLENOL) 500 MG tablet    Sig: Take 1,000 mg by mouth every 4 (four) hours as needed for mild pain.  . traMADol (ULTRAM) 50 MG tablet    Sig: Take 1 tablet (50 mg total) by mouth every 6 (six) hours as needed.    Dispense:  15 tablet    Refill:  0    Order Specific Question:   Supervising Provider    Answer:   Eber Hong [[id number]]     MDM:   ICD-10-CM   1. Other closed displaced fracture of proximal end of left humerus, initial encounter S42.292A   2. Anemia, unspecified type D64.9   3. Hypoxia R09.02     5:52 PM CBC w/diff showing mild anemia. BMP with fairly stable kidney function. U/A unremarkable. Spoke with Dr. Sharyon Medicus who had placed orders however no bed request order was done initially; he has now placed this order. Holding orders placed by admitting team. Please see their notes for further documentation of care. I appreciate their help with this pleasant pt's care. Pt stable at time of admission.    [address] [place], [place] 08/27/17 1753    Rolland Porter, MD 09/03/17 0900

## 2017-08-27 NOTE — Telephone Encounter (Signed)
Pt's daughter is aware of SN's recommendations and voiced her understanding. Nothing further needed.

## 2017-08-28 DIAGNOSIS — S42302A Unspecified fracture of shaft of humerus, left arm, initial encounter for closed fracture: Secondary | ICD-10-CM

## 2017-08-28 DIAGNOSIS — I10 Essential (primary) hypertension: Secondary | ICD-10-CM

## 2017-08-28 DIAGNOSIS — S42202A Unspecified fracture of upper end of left humerus, initial encounter for closed fracture: Principal | ICD-10-CM

## 2017-08-28 DIAGNOSIS — R0902 Hypoxemia: Secondary | ICD-10-CM

## 2017-08-28 DIAGNOSIS — J42 Unspecified chronic bronchitis: Secondary | ICD-10-CM

## 2017-08-28 DIAGNOSIS — S42292A Other displaced fracture of upper end of left humerus, initial encounter for closed fracture: Secondary | ICD-10-CM

## 2017-08-28 HISTORY — DX: Unspecified fracture of shaft of humerus, left arm, initial encounter for closed fracture: S42.302A

## 2017-08-28 LAB — BASIC METABOLIC PANEL
Anion gap: 10 (ref 5–15)
BUN: 26 mg/dL — AB (ref 6–20)
CO2: 27 mmol/L (ref 22–32)
CREATININE: 1.17 mg/dL — AB (ref 0.44–1.00)
Calcium: 9 mg/dL (ref 8.9–10.3)
Chloride: 103 mmol/L (ref 101–111)
GFR calc Af Amer: 45 mL/min — ABNORMAL LOW (ref >60)
GFR calc non Af Amer: 39 mL/min — ABNORMAL LOW (ref >60)
GLUCOSE: 155 mg/dL — AB (ref 65–99)
Potassium: 4.1 mmol/L (ref 3.5–5.1)
Sodium: 140 mmol/L (ref 135–145)

## 2017-08-28 MED ORDER — IPRATROPIUM-ALBUTEROL 0.5-2.5 (3) MG/3ML IN SOLN
3.0000 mL | Freq: Three times a day (TID) | RESPIRATORY_TRACT | Status: DC
  Administered 2017-08-29: 3 mL via RESPIRATORY_TRACT
  Filled 2017-08-28: qty 3

## 2017-08-28 MED ORDER — IPRATROPIUM-ALBUTEROL 0.5-2.5 (3) MG/3ML IN SOLN: 3.0000 mL | RESPIRATORY_TRACT | Status: DC | PRN

## 2017-08-28 MED ORDER — IPRATROPIUM-ALBUTEROL 0.5-2.5 (3) MG/3ML IN SOLN
3.0000 mL | Freq: Four times a day (QID) | RESPIRATORY_TRACT | Status: DC
  Administered 2017-08-28 (×2): 3 mL via RESPIRATORY_TRACT
  Filled 2017-08-28 (×2): qty 3

## 2017-08-28 NOTE — Progress Notes (Signed)
PROGRESS NOTE    [name]  ZOX:[id number] DOB: [birth year] DOA: 08/27/2017 PCP: Michele Mcalpine, MD    Brief Narrative:  83 y.o. female,With past medical history significant for COPD, anemia who was sent from her sister living facility after a fall with trauma to left shoulder . When I saw the patient she was sleeping after pain medications and I could not get significant history. Family member at bedside KUB most of the information. The patient was found to have left proximal humerus fracture and she received Dilaudid earlier and she became more comfortable. However her oxygen started dropping and she is not usually on oxygen at home. The patient has been on treatment for lower extremity cellulitis/stasis dermatitis with doxycycline. Her chest x-ray was normal and it was discussed with her pulmonologist was advised admission for observation at least and treatment  Assessment & Plan:   Principal Problem:   Left humeral fracture Active Problems:   Essential hypertension   COPD (chronic obstructive pulmonary disease) (HCC)   Acute respiratory failure with hypoxia (HCC)   CKD (chronic kidney disease), stage III (HCC)   Hypoxemia   1. L humeral fracture 1. S/p mechanical fall 2. Have consulted Orthopedic surgery. Discussed case with on-call surgeon who recommends conservative management and close outpatient follow up in one week 3. Pt in sling at the moment 4. Continue analgesics as tolerated 2. HTN 1. BP stable 2. Cont current regimen 3. COPD  1. No audible wheezing on exam, however, pt with decreased breath sounds on exam 2. Continue IV steroids, scheduled duonebs 4. Acute hypoxemic respiratory failure 1. Likely secondary to above COPD 2. Improved with nebs and IV steroids  DVT prophylaxis: heparin subq Code Status: Full Family Communication: Pt in room, family not at bedside Disposition Plan: Uncertain at this time  Consultants:   Orthopedic surgery over  phone  Procedures:     Antimicrobials: Anti-infectives    Start     Dose/Rate Route Frequency Ordered Stop   08/27/17 2000  ceFAZolin (ANCEF) IVPB 1 g/50 mL premix     1 g 100 mL/hr over 30 Minutes Intravenous Every 12 hours 08/27/17 1818        Subjective: Without complaints  Objective: Vitals:   08/27/17 2015 08/27/17 2039 08/28/17 0012 08/28/17 0511  BP: (!) 153/63   138/68  Pulse: 72   82  Resp: 20   20  Temp: 98.7 F (37.1 C)   98.2 F (36.8 C)  TempSrc: Oral   Oral  SpO2: 97% 96% 95% 91%  Weight:    84.6 kg (186 lb 6.4 oz)  Height:     (1.651 m)    Intake/Output Summary (Last 24 hours) at 08/28/17 1300 Last data filed at 08/28/17 1256  Gross per 24 hour  Intake              360 ml  Output              700 ml  Net             -340 ml   Filed Weights   08/28/17 0511  Weight: 84.6 kg (186 lb 6.4 oz)    Examination:  General exam: Appears calm and comfortable  Respiratory system: Clear to auscultation. diminished breath sounds Cardiovascular system: S1 & S2 heard, RRR. Gastrointestinal system: Abdomen is nondistended, soft and nontender. No organomegaly or masses felt. Normal bowel sounds heard. Central nervous system: Alert and oriented. No focal neurological deficits. Extremities: Symmetric  5 x 5 power., L arm in sling Skin: No rashes, lesions Psychiatry: Judgement and insight appear normal. Mood & affect appropriate.   Data Reviewed: I have personally reviewed following labs and imaging studies  CBC:  Recent Labs Lab 08/27/17 1539  WBC 8.0  NEUTROABS 5.9  HGB 11.3*  HCT 35.3*  MCV 91.0  PLT 155   Basic Metabolic Panel:  Recent Labs Lab 08/27/17 1539 08/28/17 0613  NA 138 140  K 3.8 4.1  CL 101 103  CO2 29 27  GLUCOSE 125* 155*  BUN 24* 26*  CREATININE 1.24* 1.17*  CALCIUM 8.9 9.0   GFR: Estimated Creatinine Clearance: 32.9 mL/min (A) (by C-G formula based on SCr of 1.17 mg/dL (H)). Liver Function Tests: No results for  input(s): AST, ALT, ALKPHOS, BILITOT, PROT, ALBUMIN in the last 168 hours. No results for input(s): LIPASE, AMYLASE in the last 168 hours. No results for input(s): AMMONIA in the last 168 hours. Coagulation Profile: No results for input(s): INR, PROTIME in the last 168 hours. Cardiac Enzymes: No results for input(s): CKTOTAL, CKMB, CKMBINDEX, TROPONINI in the last 168 hours. BNP (last 3 results) No results for input(s): PROBNP in the last 8760 hours. HbA1C: No results for input(s): HGBA1C in the last 72 hours. CBG: No results for input(s): GLUCAP in the last 168 hours. Lipid Profile: No results for input(s): CHOL, HDL, LDLCALC, TRIG, CHOLHDL, LDLDIRECT in the last 72 hours. Thyroid Function Tests: No results for input(s): TSH, T4TOTAL, FREET4, T3FREE, THYROIDAB in the last 72 hours. Anemia Panel: No results for input(s): VITAMINB12, FOLATE, FERRITIN, TIBC, IRON, RETICCTPCT in the last 72 hours. Sepsis Labs: No results for input(s): PROCALCITON, LATICACIDVEN in the last 168 hours.  No results found for this or any previous visit (from the past 240 hour(s)).   Radiology Studies: Dg Chest 2 View  Result Date: 08/27/2017 CLINICAL DATA:  Status post fall last night. Severe left shoulder and left arm pain. Hypoxia. History of cardiac dysrhythmia, borderline diabetes, and COPD. EXAM: CHEST  2 VIEW COMPARISON:  Chest x-ray of May 09, 2016 FINDINGS: There is chronic elevation of the right hemidiaphragm. The lungs are mildly hypoinflated. There is no focal infiltrate. There is no pleural effusion. The heart is top-normal in size. The pulmonary vascularity is not engorged. There is calcification in the wall of the aortic arch. There is no pleural effusion. There is an acute mildly displaced fracture of the left humeral head and neck. The left clavicle is intact where visualized. IMPRESSION: Impacted, mildly displaced fracture of the left humeral head. Mild pulmonary hypoinflation. No CHF nor  pneumonia. Chronic elevation of the right hemidiaphragm. Thoracic aortic atherosclerosis. Electronically Signed   By: David  Swaziland M.D.   On: 08/27/2017 12:09   Dg Shoulder Left  Result Date: 08/27/2017 CLINICAL DATA:  Shoulder pain. EXAM: LEFT SHOULDER - 2+ VIEW COMPARISON:  No prior . FINDINGS: None does scratched it slightly angulated fracture of the proximal left humeral metaphysis noted. No evidence of dislocation. Degenerative changes left shoulder . IMPRESSION: Slightly angulated fracture of the proximal left humeral metaphysis noted . Electronically Signed   By: Maisie Fus  Register   On: 08/27/2017 12:09   Dg Humerus Left  Result Date: 08/27/2017 CLINICAL DATA:  Fall.  Pain . EXAM: LEFT HUMERUS - 2+ VIEW COMPARISON:  No recent prior . FINDINGS: Slightly displaced fracture of the proximal left humeral metaphysis noted. Remainder of the left humerus is intact. IMPRESSION: Slightly displaced fracture of the proximal left humeral metaphysis.  Electronically Signed   By: Maisie Fus  Register   On: 08/27/2017 12:09    Scheduled Meds: . ALPRAZolam  0.25 mg Oral BID  . amLODipine  5 mg Oral Daily  . aspirin EC  81 mg Oral Daily  . cholecalciferol  2,000 Units Oral Daily  . fluticasone  1 spray Each Nare Daily  . heparin  5,000 Units Subcutaneous Q8H  . ipratropium-albuterol  3 mL Nebulization Q6H  . [START ON 08/29/2017] methimazole  2.5 mg Oral QODAY  . methylPREDNISolone (SOLU-MEDROL) injection  60 mg Intravenous Q8H  . multivitamin with minerals  1 tablet Oral Daily  . sertraline  25 mg Oral Q supper  . sodium chloride flush  3 mL Intravenous Q12H  . torsemide  20 mg Oral Daily   Continuous Infusions: . sodium chloride    .  ceFAZolin (ANCEF) IV 1 g (08/28/17 1139)     LOS: 1 day   Russel Morain, Scheryl Marten, MD [facility] Pager [phone]  If 7PM-7AM, please contact night-coverage [email or web address] Password [email or web address] 08/28/2017, 1:00 PM

## 2017-08-28 NOTE — Consult Note (Signed)
Reason for Consult:left proximal humerus fracture Referring Physician: Dr. Wyline Copas  HPI: [name] is an 83 y.o. female who sustained a left shoulder injury following a mechanical fall. She lives in assisted living facility and is a prior patient of our practice so we were asked to see.  Past Medical History:  Diagnosis Date  . Anemia, unspecified   . COPD (chronic obstructive pulmonary disease) ([place])   . Diverticulosis of colon (without mention of hemorrhage)   . DJD (degenerative joint disease)   . GERD (gastroesophageal reflux disease)   . Lumbago   . Osteoarthrosis, unspecified whether generalized or localized, unspecified site   . Other abnormal glucose   . Other and unspecified hyperlipidemia   . Pyelonephritis   . Pyelonephritis, unspecified   . Right bundle branch block   . Shortness of breath   . Solitary cyst of breast   . Spinal stenosis, unspecified region other than cervical   . Unspecified essential hypertension   . UTI (lower urinary tract infection)     Past Surgical History:  Procedure Laterality Date  . CATARACT EXTRACTION    . conversion to right THR    . right hip hemiarthroplasty     total    Family History  Problem Relation Age of Onset  . Heart failure Father   . Cancer Brother   . Cancer Brother     Social History:  reports that she has never smoked. She has never used smokeless tobacco. She reports that she does not drink alcohol or use drugs.  Allergies:  Allergies  Allergen Reactions  . Azithromycin Shortness Of Breath  . Ciprofloxacin Other (See Comments)     hallucinations  . Levofloxacin Other (See Comments)    Insomnia, indigestion, tingling sensation in legs  . Furosemide Rash    Bullous pemphigoid  . Latex Rash  . Other Rash    EKG leads caused a rash that required steroids to clear    Medications: I have reviewed the patient's current medications.  Results for orders placed or performed during the hospital encounter of  08/27/17 (from the past 48 hour(s))  CBC with Differential/Platelet     Status: Abnormal   Collection Time: 08/27/17  3:39 PM  Result Value Ref Range   WBC 8.0 4.0 - 10.5 K/uL   RBC 3.88 3.87 - 5.11 MIL/uL   Hemoglobin 11.3 (L) 12.0 - 15.0 g/dL   HCT 35.3 (L) 36.0 - 46.0 %   MCV 91.0 78.0 - 100.0 fL   MCH 29.1 26.0 - 34.0 pg   MCHC 32.0 30.0 - 36.0 g/dL   RDW 14.1 11.5 - 15.5 %   Platelets 155 150 - 400 K/uL   Neutrophils Relative % 74 %   Neutro Abs 5.9 1.7 - 7.7 K/uL   Lymphocytes Relative 17 %   Lymphs Abs 1.4 0.7 - 4.0 K/uL   Monocytes Relative 9 %   Monocytes Absolute 0.7 0.1 - 1.0 K/uL   Eosinophils Relative 0 %   Eosinophils Absolute 0.0 0.0 - 0.7 K/uL   Basophils Relative 0 %   Basophils Absolute 0.0 0.0 - 0.1 K/uL  Basic metabolic panel     Status: Abnormal   Collection Time: 08/27/17  3:39 PM  Result Value Ref Range   Sodium 138 135 - 145 mmol/L   Potassium 3.8 3.5 - 5.1 mmol/L   Chloride 101 101 - 111 mmol/L   CO2 29 22 - 32 mmol/L   Glucose, Bld 125 (H) 65 -  99 mg/dL   BUN 24 (H) 6 - 20 mg/dL   Creatinine, Ser 1.24 (H) 0.44 - 1.00 mg/dL   Calcium 8.9 8.9 - 10.3 mg/dL   GFR calc non Af Amer 37 (L) >60 mL/min   GFR calc Af Amer 42 (L) >60 mL/min    Comment: (NOTE) The eGFR has been calculated using the CKD EPI equation. This calculation has not been validated in all clinical situations. eGFR's persistently <60 mL/min signify possible Chronic Kidney Disease.    Anion gap 8 5 - 15  Urinalysis, Routine w reflex microscopic     Status: None   Collection Time: 08/27/17  5:05 PM  Result Value Ref Range   Color, Urine YELLOW YELLOW   APPearance CLEAR CLEAR   Specific Gravity, Urine 1.015 1.005 - 1.030   pH 6.0 5.0 - 8.0   Glucose, UA NEGATIVE NEGATIVE mg/dL   Hgb urine dipstick NEGATIVE NEGATIVE   Bilirubin Urine NEGATIVE NEGATIVE   Ketones, ur NEGATIVE NEGATIVE mg/dL   Protein, ur NEGATIVE NEGATIVE mg/dL   Nitrite NEGATIVE NEGATIVE   Leukocytes, UA NEGATIVE  NEGATIVE    Comment: Microscopic not done on urines with negative protein, blood, leukocytes, nitrite, or glucose < 500 mg/dL.  Basic metabolic panel     Status: Abnormal   Collection Time: 08/28/17  6:13 AM  Result Value Ref Range   Sodium 140 135 - 145 mmol/L   Potassium 4.1 3.5 - 5.1 mmol/L   Chloride 103 101 - 111 mmol/L   CO2 27 22 - 32 mmol/L   Glucose, Bld 155 (H) 65 - 99 mg/dL   BUN 26 (H) 6 - 20 mg/dL   Creatinine, Ser 1.17 (H) 0.44 - 1.00 mg/dL   Calcium 9.0 8.9 - 10.3 mg/dL   GFR calc non Af Amer 39 (L) >60 mL/min   GFR calc Af Amer 45 (L) >60 mL/min    Comment: (NOTE) The eGFR has been calculated using the CKD EPI equation. This calculation has not been validated in all clinical situations. eGFR's persistently <60 mL/min signify possible Chronic Kidney Disease.    Anion gap 10 5 - 15    Dg Chest 2 View  Result Date: 08/27/2017 CLINICAL DATA:  Status post fall last night. Severe left shoulder and left arm pain. Hypoxia. History of cardiac dysrhythmia, borderline diabetes, and COPD. EXAM: CHEST  2 VIEW COMPARISON:  Chest x-ray of May 09, 2016 FINDINGS: There is chronic elevation of the right hemidiaphragm. The lungs are mildly hypoinflated. There is no focal infiltrate. There is no pleural effusion. The heart is top-normal in size. The pulmonary vascularity is not engorged. There is calcification in the wall of the aortic arch. There is no pleural effusion. There is an acute mildly displaced fracture of the left humeral head and neck. The left clavicle is intact where visualized. IMPRESSION: Impacted, mildly displaced fracture of the left humeral head. Mild pulmonary hypoinflation. No CHF nor pneumonia. Chronic elevation of the right hemidiaphragm. Thoracic aortic atherosclerosis. Electronically Signed   By: David  Martinique M.D.   On: 08/27/2017 12:09   Dg Shoulder Left  Result Date: 08/27/2017 CLINICAL DATA:  Shoulder pain. EXAM: LEFT SHOULDER - 2+ VIEW COMPARISON:  No prior .  FINDINGS: None does scratched it slightly angulated fracture of the proximal left humeral metaphysis noted. No evidence of dislocation. Degenerative changes left shoulder . IMPRESSION: Slightly angulated fracture of the proximal left humeral metaphysis noted . Electronically Signed   By: Marcello Moores  Register   On:  08/27/2017 12:09   Dg Humerus Left  Result Date: 08/27/2017 CLINICAL DATA:  Fall.  Pain . EXAM: LEFT HUMERUS - 2+ VIEW COMPARISON:  No recent prior . FINDINGS: Slightly displaced fracture of the proximal left humeral metaphysis noted. Remainder of the left humerus is intact. IMPRESSION: Slightly displaced fracture of the proximal left humeral metaphysis. Electronically Signed   By: Marcello Moores  Register   On: 08/27/2017 12:09    ROS: per HPI  Physical Exam:   Alert and oriented Left upper extremity in sling with obvious bruising to humerus drifting distally NVI distally   Vitals Temp:  [98.2 F (36.8 C)-98.7 F (37.1 C)] 98.3 F (36.8 C) (10/10 1405) Pulse Rate:  [68-83] 83 (10/10 1405) Resp:  [18-20] 18 (10/10 1405) BP: (128-153)/(51-93) 128/51 (10/10 1405) SpO2:  [91 %-100 %] 100 % (10/10 1405) Weight:  [84.6 kg (186 lb 6.4 oz)] 84.6 kg (186 lb 6.4 oz) (10/10 0511) Body mass index is 31.02 kg/m.  Assessment/Plan: Impression: left proximal humerus fracture, minimally displaced Treatment: Given the overall acceptable alignment we will continue with sling immobilization and re xray in 1 week, continue ice and she may allow arm to gently dangle by side for hygiene.   [facility] for Dr. Justice Britain 08/28/2017, 5:34 PM  Contact # [phone]

## 2017-08-28 NOTE — Care Management Note (Signed)
Case Management Note  Patient Details  Name: [name] MRN: [id number] Date of Birth: 22-Apr-1923  Subjective/Objective:                  hypoxia  Action/Plan: Date:  August 28, 2017 Chart reviewed for concurrent status and case management needs.  Will continue to follow patient progress.  Discharge Planning: following for needs  Expected discharge date: [id number]  Marcelle Smiling, BSN, [place], [place]   [phone]   Expected Discharge Date:   (unknown)               Expected Discharge Plan:  Home/Self Care  In-House Referral:     Discharge planning Services  CM Consult  Post Acute Care Choice:    Choice offered to:     DME Arranged:    DME Agency:     HH Arranged:    HH Agency:     Status of Service:  In process, will continue to follow  If discussed at Long Length of Stay Meetings, dates discussed:    Additional Comments:  Golda Acre, RN 08/28/2017, 8:47 AM

## 2017-08-29 DIAGNOSIS — N183 Chronic kidney disease, stage 3 (moderate): Secondary | ICD-10-CM

## 2017-08-29 DIAGNOSIS — D649 Anemia, unspecified: Secondary | ICD-10-CM

## 2017-08-29 LAB — BASIC METABOLIC PANEL
Anion gap: 11 (ref 5–15)
BUN: 35 mg/dL — AB (ref 6–20)
CO2: 28 mmol/L (ref 22–32)
CREATININE: 1.2 mg/dL — AB (ref 0.44–1.00)
Calcium: 9.2 mg/dL (ref 8.9–10.3)
Chloride: 99 mmol/L — ABNORMAL LOW (ref 101–111)
GFR, EST AFRICAN AMERICAN: 44 mL/min — AB (ref >60)
GFR, EST NON AFRICAN AMERICAN: 38 mL/min — AB (ref >60)
Glucose, Bld: 164 mg/dL — ABNORMAL HIGH (ref 65–99)
Potassium: 4.2 mmol/L (ref 3.5–5.1)
SODIUM: 138 mmol/L (ref 135–145)

## 2017-08-29 MED ORDER — METHYLPREDNISOLONE SODIUM SUCC 125 MG IJ SOLR
60.0000 mg | Freq: Two times a day (BID) | INTRAMUSCULAR | Status: DC
  Administered 2017-08-29 – 2017-08-30 (×2): 60 mg via INTRAVENOUS
  Filled 2017-08-29 (×2): qty 2

## 2017-08-29 MED ORDER — IPRATROPIUM-ALBUTEROL 0.5-2.5 (3) MG/3ML IN SOLN
3.0000 mL | Freq: Two times a day (BID) | RESPIRATORY_TRACT | Status: DC
  Administered 2017-08-29 – 2017-08-30 (×2): 3 mL via RESPIRATORY_TRACT
  Filled 2017-08-29 (×2): qty 3

## 2017-08-29 NOTE — Progress Notes (Signed)
CSW spoke with patient about PT recommendation, patient requested that CSW contact her daughter to discuss PT recommendation and discharge plans. CSW contacted patient's daughter Talbert Forest [phone]) and discussed PT recommendation for SNF and discharge plans. Patient's daughter reported that patient will return to [facility] with supervision from hired staff for patient. Patient's daughter reported that patient will need PTAR for transportation at discharge.   CSW updated patient's MD.   CSW contacted [facility] to confirm patient's ability to return. Staff member reported that they would need to come assess patient prior to accepting back to ALF. Staff agreed to contact CSW with a time that patient will be assessed.   CSW will complete FL2 and continue to follow and assist patient with discharge planning.   Celso Sickle, [place] Clinical Social Worker [facility] Cell#: [phone]

## 2017-08-29 NOTE — Progress Notes (Signed)
PROGRESS NOTE    [name]  NGE:[id number] DOB: 1922/11/26 DOA: 08/27/2017 PCP: Michele Mcalpine, MD    Brief Narrative:  83 y.o. female,With past medical history significant for COPD, anemia who was sent from her sister living facility after a fall with trauma to left shoulder . When I saw the patient she was sleeping after pain medications and I could not get significant history. Family member at bedside KUB most of the information. The patient was found to have left proximal humerus fracture and she received Dilaudid earlier and she became more comfortable. However her oxygen started dropping and she is not usually on oxygen at home. The patient has been on treatment for lower extremity cellulitis/stasis dermatitis with doxycycline. Her chest x-ray was normal and it was discussed with her pulmonologist was advised admission for observation at least and treatment  Assessment & Plan:   Principal Problem:   Left humeral fracture Active Problems:   Essential hypertension   COPD (chronic obstructive pulmonary disease) (HCC)   Acute respiratory failure with hypoxia (HCC)   CKD (chronic kidney disease), stage III (HCC)   Hypoxemia   1. L humeral fracture 1. S/p mechanical fall 2. Have consulted Orthopedic surgery. Discussed case with on-call surgeon who recommends conservative management and close outpatient follow up in one week 3. Pt remains in sling at present 4. Will continue with analgesics as tolerated 2. HTN 1. BP stable 2. Will continue with current bp regimen as tolerated 3. COPD  1. No audible wheezing on exam, however, pt with decreased breath sounds on exam 2. Weaning steroids. Pt on nebs 3. At rest on room air, maintaining sats 4. Acute hypoxemic respiratory failure 1. Likely secondary to above COPD 2. Now improved with nebs and IV steroids  DVT prophylaxis: heparin subq Code Status: Full Family Communication: Pt in room, daughter at bedside Disposition Plan:  Uncertain at this time  Consultants:   Orthopedic surgery over phone  Procedures:     Antimicrobials: Anti-infectives    Start     Dose/Rate Route Frequency Ordered Stop   08/27/17 2000  ceFAZolin (ANCEF) IVPB 1 g/50 mL premix     1 g 100 mL/hr over 30 Minutes Intravenous Every 12 hours 08/27/17 1818        Subjective: No complaints this AM  Objective: Vitals:   08/29/17 0532 08/29/17 0806 08/29/17 0929 08/29/17 1509  BP: (!) 153/71  (!) 124/46 140/71  Pulse: 70  93 85  Resp: Temp: 97.8 F (36.6 C)     TempSrc: Oral     SpO2: 97% 96% 91% 97%  Weight:      Height:        Intake/Output Summary (Last 24 hours) at 08/29/17 1611 Last data filed at 08/29/17 1230  Gross per 24 hour  Intake              530 ml  Output                0 ml  Net              530 ml   Filed Weights   08/28/17 0511  Weight: 84.6 kg (186 lb 6.4 oz)    Examination: General exam: Awake, laying in bed, in nad Respiratory system: Normal respiratory effort, no wheezing Cardiovascular system: regular rate, s1, s2 Gastrointestinal system: Soft, nondistended, positive BS Central nervous system: CN2-12 grossly intact, strength intact Extremities: Perfused, no clubbing Skin: Normal skin  turgor, no notable skin lesions seen Psychiatry: Mood normal // no visual hallucinations   Data Reviewed: I have personally reviewed following labs and imaging studies  CBC:  Recent Labs Lab 08/27/17 1539  WBC 8.0  NEUTROABS 5.9  HGB 11.3*  HCT 35.3*  MCV 91.0  PLT 155   Basic Metabolic Panel:  Recent Labs Lab 08/27/17 1539 08/28/17 0613 08/29/17 0535  NA 138 140 138  K 3.8 4.1 4.2  CL 101 103 99*  CO2 GLUCOSE 125* 155* 164*  BUN 24* 26* 35*  CREATININE 1.24* 1.17* 1.20*  CALCIUM 8.9 9.0 9.2   GFR: Estimated Creatinine Clearance: 32.1 mL/min (A) (by C-G formula based on SCr of 1.2 mg/dL (H)). Liver Function Tests: No results for input(s): AST, ALT, ALKPHOS,  BILITOT, PROT, ALBUMIN in the last 168 hours. No results for input(s): LIPASE, AMYLASE in the last 168 hours. No results for input(s): AMMONIA in the last 168 hours. Coagulation Profile: No results for input(s): INR, PROTIME in the last 168 hours. Cardiac Enzymes: No results for input(s): CKTOTAL, CKMB, CKMBINDEX, TROPONINI in the last 168 hours. BNP (last 3 results) No results for input(s): PROBNP in the last 8760 hours. HbA1C: No results for input(s): HGBA1C in the last 72 hours. CBG: No results for input(s): GLUCAP in the last 168 hours. Lipid Profile: No results for input(s): CHOL, HDL, LDLCALC, TRIG, CHOLHDL, LDLDIRECT in the last 72 hours. Thyroid Function Tests: No results for input(s): TSH, T4TOTAL, FREET4, T3FREE, THYROIDAB in the last 72 hours. Anemia Panel: No results for input(s): VITAMINB12, FOLATE, FERRITIN, TIBC, IRON, RETICCTPCT in the last 72 hours. Sepsis Labs: No results for input(s): PROCALCITON, LATICACIDVEN in the last 168 hours.  No results found for this or any previous visit (from the past 240 hour(s)).   Radiology Studies: No results found.  Scheduled Meds: . ALPRAZolam  0.25 mg Oral BID  . amLODipine  5 mg Oral Daily  . aspirin EC  81 mg Oral Daily  . cholecalciferol  2,000 Units Oral Daily  . fluticasone  1 spray Each Nare Daily  . heparin  5,000 Units Subcutaneous Q8H  . ipratropium-albuterol  3 mL Nebulization BID  . methimazole  2.5 mg Oral QODAY  . methylPREDNISolone (SOLU-MEDROL) injection  60 mg Intravenous Q12H  . multivitamin with minerals  1 tablet Oral Daily  . sertraline  25 mg Oral Q supper  . sodium chloride flush  3 mL Intravenous Q12H  . torsemide  20 mg Oral Daily   Continuous Infusions: . sodium chloride    .  ceFAZolin (ANCEF) IV Stopped (08/29/17 1010)     LOS: 2 days   Darrie Macmillan, Scheryl Marten, MD [facility] Pager [phone]  If 7PM-7AM, please contact night-coverage [email or web address] Password [email or web address] 08/29/2017, 4:11  PM

## 2017-08-29 NOTE — Evaluation (Signed)
Physical Therapy Evaluation Patient Details Name: [name] MRN: [id number] DOB: 11-05-1923 Today's Date: 08/29/2017   History of Present Illness  83 y.o. female with PMH of HTN, spinal stenosis, GERD, COPD, anxiety, admitted for fall with L proximal humerus fx, conservative management.   Clinical Impression  Pt admitted with above diagnosis. Pt currently with functional limitations due to the deficits listed below (see PT Problem List). Mod assist for transfers, will need hands on assist for mobility, likely at SNF level as pt's daughter doesn't think her ALF can provide needed level of care.  Pt will benefit from skilled PT to increase their independence and safety with mobility to allow discharge to the venue listed below.       Follow Up Recommendations SNF;Supervision/Assistance - 24 hour;Supervision for mobility/OOB (needs physical assist for transfers, pt's daughter reports pt uses bathroom ~every 10 minutes)    Equipment Recommendations  None recommended by PT    Recommendations for Other Services       Precautions / Restrictions Precautions Precautions: Fall Precaution Comments: fall just PTA, daughter reports no other falls in past 1 year Required Braces or Orthoses: Sling Restrictions Weight Bearing Restrictions: Yes LUE Weight Bearing: Non weight bearing      Mobility  Bed Mobility               General bed mobility comments: up on BSC  Transfers Overall transfer level: Needs assistance Equipment used: 1 person hand held assist Transfers: Sit to/from [employer] Sit to Stand: Mod assist Stand pivot transfers: Mod assist       General transfer comment: Mod A to rise from [facility] then to pivot with hand held assist to recliner; SaO2 88% on room air after activity, applied 2L O2  Ambulation/Gait             General Gait Details: pt fatigued after transfer  Stairs            Wheelchair Mobility    Modified Rankin  (Stroke Patients Only)       Balance Overall balance assessment: Needs assistance;History of Falls   Sitting balance-Leahy Scale: Good     Standing balance support: Single extremity supported Standing balance-Leahy Scale: Poor Standing balance comment: requires single UE support for static standing                             Pertinent Vitals/Pain Pain Assessment: Faces Faces Pain Scale: Hurts whole lot Pain Location: L shoulder with activity    Home Living Family/patient expects to be discharged to:: Assisted living               Home Equipment: Walker - 4 wheels;Wheelchair - manual      Prior Function Level of Independence: Independent with assistive device(s)         Comments: mod I with rollator, has manual WC     Hand Dominance   Dominant Hand: Right    Extremity/Trunk Assessment   Upper Extremity Assessment Upper Extremity Assessment: LUE deficits/detail LUE: Unable to fully assess due to immobilization    Lower Extremity Assessment Lower Extremity Assessment: Overall WFL for tasks assessed    Cervical / Trunk Assessment Cervical / Trunk Assessment: Normal  Communication   Communication: HOH  Cognition Arousal/Alertness: Awake/alert Behavior During Therapy: WFL for tasks assessed/performed Overall Cognitive Status: Within Functional Limits for tasks assessed  General Comments      Exercises     Assessment/Plan    PT Assessment Patient needs continued PT services  PT Problem List Decreased activity tolerance;Decreased balance;Decreased mobility;Pain       PT Treatment Interventions DME instruction;Gait training;Functional mobility training;Therapeutic exercise;Balance training;Therapeutic activities;Patient/family education    PT Goals (Current goals can be found in the Care Plan section)  Acute Rehab PT Goals Patient Stated Goal: return to walking with  rollator PT Goal Formulation: With patient/family Time For Goal Achievement: 09/12/17 Potential to Achieve Goals: Good    Frequency Min 3X/week   Barriers to discharge Decreased caregiver support per daughter, pt's ALF likely cannot provide needed level of assist    Co-evaluation               AM-PAC PT "6 Clicks" Daily Activity  Outcome Measure Difficulty turning over in bed (including adjusting bedclothes, sheets and blankets)?: Unable Difficulty moving from lying on back to sitting on the side of the bed? : Unable Difficulty sitting down on and standing up from a chair with arms (e.g., wheelchair, bedside commode, etc,.)?: Unable Help needed moving to and from a bed to chair (including a wheelchair)?: A Lot Help needed walking in hospital room?: Total Help needed climbing 3-5 steps with a railing? : Total 6 Click Score: 7    End of Session Equipment Utilized During Treatment: Gait belt;Oxygen Activity Tolerance: Patient limited by fatigue;Patient limited by pain Patient left: in chair;with call bell/phone within reach;with family/visitor present;with chair alarm set Nurse Communication: Mobility status PT Visit Diagnosis: Unsteadiness on feet (R26.81);History of falling (Z91.81);Pain;Difficulty in walking, not elsewhere classified (R26.2) Pain - Right/Left: Left Pain - part of body: Shoulder    Time: 1610-9604 PT Time Calculation (min) (ACUTE ONLY): 21 min   Charges:   PT Evaluation $PT Eval Low Complexity: 1 Low     PT G Codes:          Tamala Ser 08/29/2017, 1:01 PM [phone]

## 2017-08-29 NOTE — Evaluation (Signed)
Occupational Therapy Evaluation Patient Details Name: [name] MRN: [id number] DOB: 05-11-1923 Today's Date: 08/29/2017    History of Present Illness 83 y.o. female with PMH of HTN, spinal stenosis, GERD, COPD, anxiety, admitted for fall with L proximal humerus fx, conservative management.    Clinical Impression   Pt admitted with fall and L humeral fracture. Pt currently with functional limitations due to the deficits listed below (see OT Problem List).  Pt will benefit from skilled OT to increase their safety and independence with ADL and functional mobility for ADL to facilitate discharge to venue listed below.      Follow Up Recommendations  Home health OT;Supervision/Assistance - 24 hour;SNF    Equipment Recommendations  None recommended by OT       Precautions / Restrictions Precautions Precautions: Fall Precaution Comments: fall just PTA, daughter reports no other falls in past 1 year Required Braces or Orthoses: Sling Restrictions Weight Bearing Restrictions: Yes LUE Weight Bearing: Non weight bearing      Mobility Bed Mobility               General bed mobility comments: in chair  Transfers Overall transfer level: Needs assistance Equipment used: 1 person hand held assist Transfers: Sit to/from Stand Sit to Stand: Mod assist Stand pivot transfers: Mod assist       General transfer comment: Mod A to rise from [facility] then to pivot with hand held assist to recliner; SaO2 88% on room air after activity, applied 2L O2    Balance Overall balance assessment: Needs assistance;History of Falls   Sitting balance-Leahy Scale: Good     Standing balance support: Single extremity supported Standing balance-Leahy Scale: Poor Standing balance comment: requires single UE support for static standing                           ADL either performed or assessed with clinical judgement   ADL Overall ADL's : Needs assistance/impaired Eating/Feeding:  Minimal assistance;Sitting   Grooming: Minimal assistance;Sitting   Upper Body Bathing: Moderate assistance;Sitting   Lower Body Bathing: Maximal assistance;Sit to/from stand;Cueing for safety;Cueing for sequencing   Upper Body Dressing : Moderate assistance;Sitting   Lower Body Dressing: Maximal assistance;Sit to/from stand;Cueing for safety;Cueing for sequencing                 General ADL Comments: pt will have extra care at ALF per daughter     Vision Patient Visual Report: No change from baseline              Pertinent Vitals/Pain Pain Assessment: Faces Faces Pain Scale: Hurts little more Pain Location: L shoulder with activity     Hand Dominance Right   Extremity/Trunk Assessment Upper Extremity Assessment Upper Extremity Assessment: LUE deficits/detail LUE Deficits / Details: only allowed to dangle for ADL activity and move hand and elbow LUE: Unable to fully assess due to immobilization   Lower Extremity Assessment Lower Extremity Assessment: Overall WFL for tasks assessed   Cervical / Trunk Assessment Cervical / Trunk Assessment: Normal   Communication Communication Communication: HOH   Cognition Arousal/Alertness: Awake/alert Behavior During Therapy: WFL for tasks assessed/performed Overall Cognitive Status: Within Functional Limits for tasks assessed                                     General Comments   educated pt in dressing  technique and sling management - will need further inctruction    Exercises  hand, wrist and elbow gentle ROM        Home Living Family/patient expects to be discharged to:: Assisted living                             Home Equipment: Walker - 4 wheels;Wheelchair - manual          Prior Functioning/Environment Level of Independence: Independent with assistive device(s)        Comments: mod I with rollator, has manual WC        OT Problem List: Decreased strength;Decreased  activity tolerance;Decreased knowledge of use of DME or AE;Impaired UE functional use;Impaired balance (sitting and/or standing);Decreased range of motion;Decreased safety awareness      OT Treatment/Interventions: Self-care/ADL training;Patient/family education;DME and/or AE instruction    OT Goals(Current goals can be found in the care plan section) Acute Rehab OT Goals Patient Stated Goal: return to walking with rollator OT Goal Formulation: With patient Time For Goal Achievement: 09/12/17 Potential to Achieve Goals: Good  OT Frequency: Min 2X/week   Barriers to D/C:               AM-PAC PT "6 Clicks" Daily Activity     Outcome Measure Help from another person eating meals?: A Little Help from another person taking care of personal grooming?: A Little Help from another person toileting, which includes using toliet, bedpan, or urinal?: Total Help from another person bathing (including washing, rinsing, drying)?: A Lot Help from another person to put on and taking off regular upper body clothing?: A Lot Help from another person to put on and taking off regular lower body clothing?: Total 6 Click Score: 12   End of Session Nurse Communication: Mobility status  Activity Tolerance: Patient tolerated treatment well Patient left: in chair  OT Visit Diagnosis: Unsteadiness on feet (R26.81);Muscle weakness (generalized) (M62.81)                Time: 1530-1550 OT Time Calculation (min): 20 min Charges:  OT General Charges $OT Visit: 1 Visit OT Evaluation $OT Eval Moderate Complexity: 1 Mod G-Codes:     Lise Auer, OT [phone]  Einar Crow D 08/29/2017, 4:06 PM

## 2017-08-29 NOTE — Clinical Social Work Note (Signed)
Clinical Social Work Assessment  Patient Details  Name: [name] MRN: [id number] Date of Birth: 02-14-1923  Date of referral:  08/29/17               Reason for consult:  Discharge Planning                Permission sought to share information with:  Case Manager, Facility Medical sales representative, Family Supports Permission granted to share information::     Name::        Agency::  [facility]  Relationship::  Daughter  Solicitor Information:     Housing/Transportation Living arrangements for the past 2 months:  Assisted Dealer of Information:  Patient, Medical Team, Case Manager, Adult Children, Facility Patient Interpreter Needed:  None Criminal Activity/Legal Involvement Pertinent to Current Situation/Hospitalization:  No - Comment as needed Significant Relationships:  Adult Children, Other Family Members, Community Support Lives with:  Facility Resident Do you feel safe going back to the place where you live?  Yes Need for family participation in patient care:  Yes (Comment)  Care giving concerns:  NO care giving concerns. Daughter is wanting patient to return to [facility] at discharge (ALF). Daughter will transport back to facility when medically stable.  Patient admitted with fall at facility.  Ortho has consulted patient Impression: left proximal humerus fracture, minimally displaced Treatment: Given the overall acceptable alignment we will continue with sling immobilization and re xray in 1 week, continue ice and she may allow arm to gently dangle by side for hygiene.    Social Worker assessment / plan:  Assessment completed Patient to return to ALF at discharge. Daughter to take back once medically cleared.  Will update FL2 once medically stable for dc meds Will update facility once orders for DC completed.  Employment status:  Retired Health and safety inspector:  Medicare PT Recommendations:  Not assessed at this time Information / Referral to  community resources:   NA  Patient/Family's Response to care:  Understanding of care.  Patient/Family's Understanding of and Emotional Response to Diagnosis, Current Treatment, and Prognosis:  No barriers with return. Patient understands her reason for admission and fall.    Emotional Assessment Appearance:  Appears stated age Attitude/Demeanor/Rapport:    Affect (typically observed):  Accepting, Adaptable Orientation:  Oriented to Self, Oriented to Place, Oriented to  Time, Oriented to Situation Alcohol / Substance use:  Not Applicable Psych involvement (Current and /or in the community):  No (Comment)  Discharge Needs  Concerns to be addressed:  No discharge needs identified Readmission within the last 30 days:  No Current discharge risk:  None Barriers to Discharge:  No Barriers Identified   Raye Sorrow, LCSW 08/29/2017, 11:18 AM

## 2017-08-30 DIAGNOSIS — J9601 Acute respiratory failure with hypoxia: Secondary | ICD-10-CM

## 2017-08-30 LAB — BASIC METABOLIC PANEL
Anion gap: 11 (ref 5–15)
BUN: 37 mg/dL — AB (ref 6–20)
CHLORIDE: 99 mmol/L — AB (ref 101–111)
CO2: 30 mmol/L (ref 22–32)
CREATININE: 1.04 mg/dL — AB (ref 0.44–1.00)
Calcium: 9.4 mg/dL (ref 8.9–10.3)
GFR calc non Af Amer: 45 mL/min — ABNORMAL LOW (ref >60)
GFR, EST AFRICAN AMERICAN: 52 mL/min — AB (ref >60)
Glucose, Bld: 136 mg/dL — ABNORMAL HIGH (ref 65–99)
Potassium: 4.4 mmol/L (ref 3.5–5.1)
Sodium: 140 mmol/L (ref 135–145)

## 2017-08-30 MED ORDER — CEPHALEXIN 500 MG PO CAPS: 500.0000 mg | ORAL_CAPSULE | Freq: Four times a day (QID) | ORAL | 0 refills | Status: DC

## 2017-08-30 MED ORDER — PREDNISONE 5 MG PO TABS: 5.0000 mg | ORAL_TABLET | Freq: Every day | ORAL | Status: DC

## 2017-08-30 MED ORDER — ALBUTEROL SULFATE HFA 108 (90 BASE) MCG/ACT IN AERS: INHALATION_SPRAY | RESPIRATORY_TRACT | 2 refills | Status: DC

## 2017-08-30 MED ORDER — CEPHALEXIN 500 MG PO CAPS
500.0000 mg | ORAL_CAPSULE | Freq: Four times a day (QID) | ORAL | Status: DC
  Administered 2017-08-30: 500 mg via ORAL
  Filled 2017-08-30: qty 1

## 2017-08-30 NOTE — Progress Notes (Signed)
Report given to [facility] at [facility]. Patient transported via [facility]. No questions or concerns at this time.

## 2017-08-30 NOTE — NC FL2 (Signed)
[place] MEDICAID FL2 LEVEL OF CARE SCREENING TOOL     IDENTIFICATION  Patient Name: [name] Birthdate: [birth year] Sex: female Admission Date (Current Location): 08/27/2017  [facility] and [place] Number:  Producer, television/film/video and Address:  [facility],  [address] [place], [place] [address]      Provider Number: [id number]  Attending Physician Name and Address:  Jerald Kief, MD  Relative Name and Phone Number:       Current Level of Care: Hospital Recommended Level of Care: Assisted Living Facility Prior Approval Number:    Date Approved/Denied:   PASRR Number:    Discharge Plan: Other (Comment) (Assisted Living Facility)    Current Diagnoses: Patient Active Problem List   Diagnosis Date Noted  . Left humeral fracture 08/28/2017  . Hypoxemia 08/27/2017  . Cellulitis 08/23/2017  . Cough 05/09/2016  . Dysphagia 05/09/2016  . Esophageal dysmotility 05/09/2016  . Stage 1 decubitus ulcer 05/01/2016  . Respiratory failure (HCC) 04/27/2016  . Pressure ulcer 04/24/2016  . Hypoxia   . Acute respiratory failure with hypoxia (HCC) 04/23/2016  . HCAP (healthcare-associated pneumonia): Probable 04/23/2016  . COPD exacerbation (HCC) 04/23/2016  . Dehydration 04/23/2016  . CKD (chronic kidney disease), stage III (HCC) 04/23/2016  . Headache   . Allergic rhinitis 01/11/2016  . Thyrotoxicosis 12/07/2015  . Fatigue 11/22/2015  . Insomnia 11/22/2015  . Choking sensation 12/09/2014  . Rash 12/09/2014  . COPD (chronic obstructive pulmonary disease) (HCC) 11/17/2014  . Chronic venous insufficiency 04/14/2014  . Abnormality of gait 08/17/2013  . Acute Gwendolyn 07/29/2012  . GERD (gastroesophageal reflux disease) 09/12/2011  . Neck pain 06/18/2011  . Anxiety 05/14/2011  . Acute bronchitis 05/02/2011  . ONYCHOMYCOSIS 12/13/2008  . UTI 12/13/2008  . Bullous pemphigoid 12/13/2008  . EDEMA 10/21/2008  . Diverticulosis of large intestine 12/01/2007  .  Constipation 12/01/2007  . PYELONEPHRITIS 12/01/2007  . BREAST CYST 12/01/2007  . Osteoarthritis 12/01/2007  . DYSPNEA 12/01/2007  . Spinal stenosis of lumbar region 10/28/2007  . Elevated lipids 10/27/2007  . ANEMIA 10/27/2007  . Essential hypertension 10/27/2007  . RIGHT BUNDLE BRANCH BLOCK 10/27/2007  . LOW BACK PAIN SYNDROME 10/27/2007  . DIABETES MELLITUS, BORDERLINE 10/27/2007    Orientation RESPIRATION BLADDER Height & Weight     Self, Time, Situation, Place  Normal Continent Weight: 186 lb 6.4 oz (84.6 kg) Height:   (165.1 cm)  BEHAVIORAL SYMPTOMS/MOOD NEUROLOGICAL BOWEL NUTRITION STATUS      Continent Diet (no added table salt)  AMBULATORY STATUS COMMUNICATION OF NEEDS Skin   Limited Assist Verbally Normal                       Personal Care Assistance Level of Assistance  Bathing, Feeding, Dressing Bathing Assistance: Maximum assistance Feeding assistance: Independent Dressing Assistance: Maximum assistance     Functional Limitations Info             SPECIAL CARE FACTORS FREQUENCY  PT (By licensed PT), OT (By licensed OT)     PT Frequency: HHPT  OT Frequency: HHOT            Contractures      Additional Factors Info  Code Status, Allergies Code Status Info: Full Code Allergies Info: Azithromycin,Furosemide,Latex,Ciprofloxacin,Levofloxacin            Discharge Medications  Medication List     STOP taking these medications   acetaZOLAMIDE 250 MG tablet Commonly known as:  DIAMOX  doxycycline 100 MG tablet Commonly known as:  VIBRA-TABS   FIRST-DUKES MOUTHWASH Susp     TAKE these medications   acetaminophen 500 MG tablet Commonly known as:  TYLENOL Take 1,000 mg by mouth every 4 (four) hours as needed for mild pain.   ADVAIR DISKUS 100-50 MCG/DOSE Aepb Generic drug:  Fluticasone-Salmeterol INHALE 1 PUFF BY MOUTH TWICE DAILY. RINSE MOUTH AFTER USE TO PREVENT THRUSH.   aerochamber plus with mask inhaler Use as  instructed   albuterol 108 (90 Base) MCG/ACT inhaler Commonly known as:  PROAIR HFA USE 2 PUFFS EVERY SIX HOURS AS NEEDED. What changed:  how much to take  how to take this  when to take this  reasons to take this  additional instructions   albuterol 108 (90 Base) MCG/ACT inhaler Commonly known as:  PROAIR HFA USE  2 PUFFS EVERY SIX HOURS AS NEEDED. What changed:  additional instructions   ALPRAZolam 0.25 MG tablet Commonly known as:  XANAX Take 0.25 mg by mouth 2 (two) times daily.   amLODipine 5 MG tablet Commonly known as:  NORVASC TAKE ONE TABLET BY MOUTH ONCE DAILY.   ANBESOL MT Apply 1 application topically as needed (for tenderness around loose bridge).   ASPIRIN LOW DOSE 81 MG EC tablet Generic drug:  aspirin TAKE ONE TABLET BY MOUTH ONCE DAILY.   cephALEXin 500 MG capsule Commonly known as:  KEFLEX Take 1 capsule (500 mg total) by mouth every 6 (six) hours.   cetirizine 10 MG tablet Commonly known as:  ZYRTEC TAKE 1 TABLET BY MOUTH ONCE DAILY FOR ALLERGIES.   DAILY-VITE Tabs TAKE ONE TABLET BY MOUTH ONCE DAILY.   fluticasone 50 MCG/ACT nasal spray Commonly known as:  FLONASE SPRAY 2 SPRAYS INTO EACH NOSTRIL ONCE DAILY.   ipratropium-albuterol 0.5-2.5 (3) MG/3ML Soln Commonly known as:  DUONEB Take 3 mLs by nebulization every 4 (four) hours as needed. What changed:  reasons to take this   methimazole 5 MG tablet Commonly known as:  TAPAZOLE TAKE 1/2 TABLET (2.5MG ) BY MOUTH EVERY OTHER DAY.   polyethylene glycol powder powder Commonly known as:  GLYCOLAX/MIRALAX MIX 1 CAPFUL (17G) IN 8 OUNCES OF JUICE/WATER AND DRINK ONCE DAILY ASNEEDED FOR CONSTIPATION.   predniSONE 5 MG tablet Commonly known as:  DELTASONE Take 1 tablet (5 mg total) by mouth daily with breakfast.   ranitidine 150 MG tablet Commonly known as:  ZANTAC TAKE (1) TABLET BY MOUTH ONCE DAILY.   sertraline 50 MG tablet Commonly known as:  ZOLOFT 1/2 tablet by  mouth daily What changed:  how much to take  how to take this  when to take this  additional instructions   torsemide 20 MG tablet Commonly known as:  DEMADEX TAKE 20 MG BY MOUTH EVERY MORNING.   traMADol 50 MG tablet Commonly known as:  ULTRAM Take 1 tablet (50 mg total) by mouth every 6 (six) hours as needed.   Vitamin D3 2000 units capsule Take 1 capsule by mouth once daily         Relevant Imaging Results:  Relevant Lab Results:   Additional Information SS#:[id number]  Patient needs home health PT and home health OT  Antionette Poles, LCSW

## 2017-08-30 NOTE — Progress Notes (Signed)
Pt. Placed on 2 lpm [place] for saturations of 86% on room air.

## 2017-08-30 NOTE — Progress Notes (Signed)
Patient returning to [facility]. Facility aware and confirmed patient's ability to return. PTAR contacted, family notified. Patient's RN can call report to [phone], packet complete. CSW signing off, no other needs identified at this time.  Celso Sickle, [place] Clinical Social Worker [facility] Cell#: [phone]

## 2017-08-30 NOTE — Discharge Summary (Addendum)
Physician Discharge Summary  [name] WUJ:[id number] DOB: 1923/10/14 DOA: 08/27/2017  PCP: Michele Mcalpine, MD  Admit date: 08/27/2017 Discharge date: 08/30/2017  Admitted From: ALF Disposition:  ALF  Recommendations for Outpatient Follow-up:  1. Follow up with PCP in 1-2 weeks 2. Follow up with Orthopedic Surgery in one week 3. 4 more days of keflex, stop date 10/16  Discharge Condition:Improved CODE STATUS:Full Diet recommendation: Regular   Brief/Interim Summary: 83 y.o.female,With past medical history significant for COPD, anemia who was sent from her sister living facility after a fall with trauma to left shoulder .When I saw the patient she was sleeping after pain medications and I could not get significant history. Family member at bedside KUB most of the information. The patient was found to have left proximal humerus fracture and she received Dilaudid earlier and she became more comfortable. However her oxygen started dropping and she is not usually on oxygen at home. The patient has been on treatment for lower extremity cellulitis/stasis dermatitis with doxycycline. Her chest x-ray was normal and it was discussed with her pulmonologist was advised admission for observation at least and treatment  Discharge Diagnoses:  Principal Problem:   Left humeral fracture Active Problems:   Essential hypertension   COPD (chronic obstructive pulmonary disease) (HCC)   Acute respiratory failure with hypoxia (HCC)   CKD (chronic kidney disease), stage III (HCC)   Hypoxemia  1. L humeral fracture 1. S/p mechanical fall 2. Have consulted Orthopedic surgery. Discussed case with on-call surgeon who recommends conservative management and close outpatient follow up in one week 3. Pt remains in sling at present 4. Patient was continued with analgesics as tolerated 2. HTN 1. BP remained stable 2. Continued with current bp regimen as tolerated 3. Acute COPD exacerbation 1. No  audible wheezing on exam, however, pt with decreased breath sounds on exam 2. Weaning steroids. Pt on nebs 3. At rest on room air, maintaining sats 4. Acute hypoxemic respiratory failure 1. Likely secondary to above COPD 2. Now improved. Wean steroids  Discharge Instructions   Allergies as of 08/30/2017      Reactions   Azithromycin Shortness Of Breath   Ciprofloxacin Other (See Comments)    hallucinations   Levofloxacin Other (See Comments)   Insomnia, indigestion, tingling sensation in legs   Furosemide Rash   Bullous pemphigoid   Latex Rash   Other Rash   EKG leads caused a rash that required steroids to clear      Medication List    STOP taking these medications   acetaZOLAMIDE 250 MG tablet Commonly known as:  DIAMOX   doxycycline 100 MG tablet Commonly known as:  VIBRA-TABS   FIRST-DUKES MOUTHWASH Susp     TAKE these medications   acetaminophen 500 MG tablet Commonly known as:  TYLENOL Take 1,000 mg by mouth every 4 (four) hours as needed for mild pain.   ADVAIR DISKUS 100-50 MCG/DOSE Aepb Generic drug:  Fluticasone-Salmeterol INHALE 1 PUFF BY MOUTH TWICE DAILY. RINSE MOUTH AFTER USE TO PREVENT THRUSH.   aerochamber plus with mask inhaler Use as instructed   albuterol 108 (90 Base) MCG/ACT inhaler Commonly known as:  PROAIR HFA USE 1 TO 2 PUFFS EVERY SIX HOURS AS NEEDED. What changed:  how much to take  how to take this  when to take this  reasons to take this  additional instructions   albuterol 108 (90 Base) MCG/ACT inhaler Commonly known as:  PROAIR HFA USE  2 PUFFS EVERY SIX  HOURS AS NEEDED. What changed:  additional instructions   ALPRAZolam 0.25 MG tablet Commonly known as:  XANAX Take 0.25 mg by mouth 2 (two) times daily.   amLODipine 5 MG tablet Commonly known as:  NORVASC TAKE ONE TABLET BY MOUTH ONCE DAILY.   ANBESOL MT Apply 1 application topically as needed (for tenderness around loose bridge).   ASPIRIN LOW DOSE 81 MG  EC tablet Generic drug:  aspirin TAKE ONE TABLET BY MOUTH ONCE DAILY.   cephALEXin 500 MG capsule Commonly known as:  KEFLEX Take 1 capsule (500 mg total) by mouth every 6 (six) hours.   cetirizine 10 MG tablet Commonly known as:  ZYRTEC TAKE 1 TABLET BY MOUTH ONCE DAILY FOR ALLERGIES.   DAILY-VITE Tabs TAKE ONE TABLET BY MOUTH ONCE DAILY.   fluticasone 50 MCG/ACT nasal spray Commonly known as:  FLONASE SPRAY 2 SPRAYS INTO EACH NOSTRIL ONCE DAILY.   ipratropium-albuterol 0.5-2.5 (3) MG/3ML Soln Commonly known as:  DUONEB Take 3 mLs by nebulization every 4 (four) hours as needed. What changed:  reasons to take this   methimazole 5 MG tablet Commonly known as:  TAPAZOLE TAKE 1/2 TABLET (2.5MG ) BY MOUTH EVERY OTHER DAY.   polyethylene glycol powder powder Commonly known as:  GLYCOLAX/MIRALAX MIX 1 CAPFUL (17G) IN 8 OUNCES OF JUICE/WATER AND DRINK ONCE DAILY ASNEEDED FOR CONSTIPATION.   predniSONE 5 MG tablet Commonly known as:  DELTASONE Take 1 tablet (5 mg total) by mouth daily with breakfast.   ranitidine 150 MG tablet Commonly known as:  ZANTAC TAKE (1) TABLET BY MOUTH ONCE DAILY.   sertraline 50 MG tablet Commonly known as:  ZOLOFT 1/2 tablet by mouth daily What changed:  how much to take  how to take this  when to take this  additional instructions   torsemide 20 MG tablet Commonly known as:  DEMADEX TAKE 20 MG BY MOUTH EVERY MORNING.   traMADol 50 MG tablet Commonly known as:  ULTRAM Take 1 tablet (50 mg total) by mouth every 6 (six) hours as needed.   Vitamin D3 2000 units capsule Take 1 capsule by mouth once daily      Follow-up Information    Supple, Kevin,Francena Hanly up.   Specialty:  Orthopedic Surgery Why:  call to be seen next wednesday for repeat xrays Contact information: [address] [place] [address] [phone]        Michele Mcalpine, MD. Schedule an appointment as soon as possible for a visit in 1  week(s).   Specialty:  Pulmonary Disease Contact information: [address] [place] [address] [phone]          Allergies  Allergen Reactions  . Azithromycin Shortness Of Breath  . Ciprofloxacin Other (See Comments)     hallucinations  . Levofloxacin Other (See Comments)    Insomnia, indigestion, tingling sensation in legs  . Furosemide Rash    Bullous pemphigoid  . Latex Rash  . Other Rash    EKG leads caused a rash that required steroids to clear    Consultations:  Orthopedic Surgey  Procedures/Studies: Dg Chest 2 View  Result Date: 08/27/2017 CLINICAL DATA:  Status post fall last night. Severe left shoulder and left arm pain. Hypoxia. History of cardiac dysrhythmia, borderline diabetes, and COPD. EXAM: CHEST  2 VIEW COMPARISON:  Chest x-ray of May 09, 2016 FINDINGS: There is chronic elevation of the right hemidiaphragm. The lungs are mildly hypoinflated. There is no focal infiltrate. There is  no pleural effusion. The heart is top-normal in size. The pulmonary vascularity is not engorged. There is calcification in the wall of the aortic arch. There is no pleural effusion. There is an acute mildly displaced fracture of the left humeral head and neck. The left clavicle is intact where visualized. IMPRESSION: Impacted, mildly displaced fracture of the left humeral head. Mild pulmonary hypoinflation. No CHF nor pneumonia. Chronic elevation of the right hemidiaphragm. Thoracic aortic atherosclerosis. Electronically Signed   By: David  [place] M.D.   On: 08/27/2017 12:09   Dg Shoulder Left  Result Date: 08/27/2017 CLINICAL DATA:  Shoulder pain. EXAM: LEFT SHOULDER - 2+ VIEW COMPARISON:  No prior . FINDINGS: None does scratched it slightly angulated fracture of the proximal left humeral metaphysis noted. No evidence of dislocation. Degenerative changes left shoulder . IMPRESSION: Slightly angulated fracture of the proximal left humeral metaphysis noted . Electronically Signed    By: Maisie Fus  Register   On: 08/27/2017 12:09   Dg Humerus Left  Result Date: 08/27/2017 CLINICAL DATA:  Fall.  Pain . EXAM: LEFT HUMERUS - 2+ VIEW COMPARISON:  No recent prior . FINDINGS: Slightly displaced fracture of the proximal left humeral metaphysis noted. Remainder of the left humerus is intact. IMPRESSION: Slightly displaced fracture of the proximal left humeral metaphysis. Electronically Signed   By: Maisie Fus  Register   On: 08/27/2017 12:09    Subjective: Without complaints  Discharge Exam: Vitals:   08/30/17 0645 08/30/17 0903  BP: (!) 163/71   Pulse: 65   Resp: 20   Temp: 98 F (36.7 C)   SpO2: 91% 98%   Vitals:   08/29/17 2031 08/29/17 2050 08/30/17 0645 08/30/17 0903  BP:  (!) 157/73 (!) 163/71   Pulse:  78 65   Resp:  20 20   Temp:  98.1 F (36.7 C) 98 F (36.7 C)   TempSrc:  Oral Oral   SpO2: (!) 86% 96% 91% 98%  Weight:      Height:        General: Pt is alert, awake, not in acute distress Cardiovascular: RRR, S1/S2 +, no rubs, no gallops Respiratory: CTA bilaterally, no wheezing, no rhonchi Abdominal: Soft, NT, ND, bowel sounds + Extremities: no edema, no cyanosis   The results of significant diagnostics from this hospitalization (including imaging, microbiology, ancillary and laboratory) are listed below for reference.     Microbiology: No results found for this or any previous visit (from the past 240 hour(s)).   Labs: BNP (last 3 results) No results for input(s): BNP in the last 8760 hours. Basic Metabolic Panel:  Recent Labs Lab 08/27/17 1539 08/28/17 0613 08/29/17 0535 08/30/17 0607  NA 138 140 138 140  K 3.8 4.1 4.2 4.4  CL 101 103 99* 99*  CO2 GLUCOSE 125* 155* 164* 136*  BUN 24* 26* 35* 37*  CREATININE 1.24* 1.17* 1.20* 1.04*  CALCIUM 8.9 9.0 9.2 9.4   Liver Function Tests: No results for input(s): AST, ALT, ALKPHOS, BILITOT, PROT, ALBUMIN in the last 168 hours. No results for input(s): LIPASE, AMYLASE in the  last 168 hours. No results for input(s): AMMONIA in the last 168 hours. CBC:  Recent Labs Lab 08/27/17 1539  WBC 8.0  NEUTROABS 5.9  HGB 11.3*  HCT 35.3*  MCV 91.0  PLT 155   Cardiac Enzymes: No results for input(s): CKTOTAL, CKMB, CKMBINDEX, TROPONINI in the last 168 hours. BNP: Invalid input(s): POCBNP CBG: No results for input(s): GLUCAP in the  last 168 hours. D-Dimer No results for input(s): DDIMER in the last 72 hours. Hgb A1c No results for input(s): HGBA1C in the last 72 hours. Lipid Profile No results for input(s): CHOL, HDL, LDLCALC, TRIG, CHOLHDL, LDLDIRECT in the last 72 hours. Thyroid function studies No results for input(s): TSH, T4TOTAL, T3FREE, THYROIDAB in the last 72 hours.  Invalid input(s): FREET3 Anemia work up No results for input(s): VITAMINB12, FOLATE, FERRITIN, TIBC, IRON, RETICCTPCT in the last 72 hours. Urinalysis    Component Value Date/Time   COLORURINE YELLOW 08/27/2017 1705   APPEARANCEUR CLEAR 08/27/2017 1705   LABSPEC 1.015 08/27/2017 1705   PHURINE 6.0 08/27/2017 1705   GLUCOSEU NEGATIVE 08/27/2017 1705   GLUCOSEU NEGATIVE 04/20/2016 1513   HGBUR NEGATIVE 08/27/2017 1705   BILIRUBINUR NEGATIVE 08/27/2017 1705   KETONESUR NEGATIVE 08/27/2017 1705   PROTEINUR NEGATIVE 08/27/2017 1705   UROBILINOGEN 0.2 04/20/2016 1513   NITRITE NEGATIVE 08/27/2017 1705   LEUKOCYTESUR NEGATIVE 08/27/2017 1705   Sepsis Labs Invalid input(s): PROCALCITONIN,  WBC,  LACTICIDVEN Microbiology No results found for this or any previous visit (from the past 240 hour(s)).   SIGNED:   Jerald Kief, MD  [facility] 08/30/2017, 2:25 PM  If 7PM-7AM, please contact night-coverage [email or web address] Password [email or web address]

## 2017-08-30 NOTE — Progress Notes (Signed)
August 30, 2017 Chart and discharge orders researched for Case Management needs. None found and patient discharged to appropriate level of care. Patient and family have no further questions. Evona Westra, BSN, RN3, CCM [phone]. 

## 2017-08-30 NOTE — Progress Notes (Signed)
SATURATION QUALIFICATIONS: (This note is used to comply with regulatory documentation for home oxygen)  Patient Saturations on Room Air at Rest = 98%  Patient Saturations on Room Air while Ambulating = 93%  Patient Saturations on 0 Liters of oxygen while Ambulating = 93%  Please briefly explain why patient needs home oxygen: Patient assisted from chair to [facility] with no respiratory distress.

## 2017-09-02 ENCOUNTER — Other Ambulatory Visit: Payer: Self-pay | Admitting: Pulmonary Disease

## 2017-09-03 ENCOUNTER — Telehealth: Payer: Self-pay | Admitting: Pulmonary Disease

## 2017-09-03 MED ORDER — ALPRAZOLAM 0.25 MG PO TABS: ORAL_TABLET | ORAL | 0 refills | Status: DC

## 2017-09-03 NOTE — Telephone Encounter (Signed)
Spoke with [facility], they are requesting Rx for Xanax. SN is ok with this per Leigh, can someone sign the RX for him because he is out of the office. I have to fax RX to living facility and they need a hard copy.

## 2017-09-03 NOTE — Telephone Encounter (Signed)
Per TP: okay to sign Rx Rx printed and signed by TP  Called [facility], spoke with Mitzi Hansen >> Rx was received and being faxed to pharmacy Nothing further needed; will sign off

## 2017-09-04 ENCOUNTER — Other Ambulatory Visit: Payer: Self-pay | Admitting: Pulmonary Disease

## 2017-09-06 ENCOUNTER — Telehealth: Payer: Self-pay | Admitting: Adult Health

## 2017-09-06 NOTE — Telephone Encounter (Signed)
Sorry to hear that  That is fine .  follow up when able . Please contact office for sooner follow up if symptoms do not improve or worsen or seek emergency care

## 2017-09-06 NOTE — Telephone Encounter (Signed)
Spoke with [facility] Health SolutionsShirley. She stated that her mother had a hard fall last week and broke her shoulder. She is currently in an arm sling and it is very painful to get her in and out of the house. She would like to cancel her appt with TP on Monday 09/09/17.   She stated that the swelling in her legs have gone down drastically since her last visit with TP.   Advised patient that I would cancel the Monday appt and send a message to TP advising her of the edema decreasing. She verbalized understanding. Nothing further needed at time of call.

## 2017-09-09 ENCOUNTER — Ambulatory Visit: Payer: Medicare Other | Admitting: Adult Health

## 2017-09-11 ENCOUNTER — Telehealth: Payer: Self-pay | Admitting: Pulmonary Disease

## 2017-09-11 NOTE — Telephone Encounter (Signed)
Left a message for [name] to call back.

## 2017-09-11 NOTE — Telephone Encounter (Signed)
Spoke with [facility]irley. She stated that she believes the patient's medications have been rearranged. Patient is not falling asleep until 3am and will only sleep for 2 hours. She fell recently and broke her shoulder. Now she has to wear a sling for the next 6 months. [name] believes the patient is irritated because of this. \  She wants to know can she do in order to help her sleep during the night.   SN, please advise. Thanks!

## 2017-09-12 NOTE — Telephone Encounter (Signed)
Pt returned phone call; pt contact # [phone]

## 2017-09-12 NOTE — Telephone Encounter (Signed)
Called pt and advised message from the provider. Pt understood and verbalized understanding. Nothing further is needed.    

## 2017-09-12 NOTE — Telephone Encounter (Signed)
ATC pt, no answer. Left message for pt to call back.  

## 2017-09-12 NOTE — Telephone Encounter (Signed)
Per SN--  More meds for sleep run the risk of increase confusion and falling----recs for the pt to try over the counter melatonin 3 mg at bedtime.  thanks

## 2017-09-16 ENCOUNTER — Telehealth: Payer: Self-pay | Admitting: Pulmonary Disease

## 2017-09-16 NOTE — Telephone Encounter (Signed)
Patient [name] called back - states that she spoke with Tamala Bariottie Gibson head [facility] and she thinks that the patient has a UTI and that may be what is causing the patient to stay up at night. Karen KitchensDottie can be reached at [phone] and [name] can be reached at [phone]. -pr

## 2017-09-16 NOTE — Telephone Encounter (Addendum)
Pt's daughter, [name] states pt is having burning with urinating & frequent need to urinate x1w. Pt is requesting that a order be sent to [facility] for urinalysis. [name] also states pt is having trouble sleeping at night.  [facility]'s fax number is [phone].   SN please advise. Thanks.

## 2017-09-16 NOTE — Telephone Encounter (Signed)
Per SN---change the cipro to septra 1 po bid x 7 days.   #14 and this has been faxed over to [facility] along with the melatonin order.  Nothing further is needed.

## 2017-09-16 NOTE — Telephone Encounter (Signed)
Per SN---  Call in to request a UA and urine culture then once this is collected we can start her on cipro 500 mg  #14  1 po BID.     Called and spoke with pts daughter and she stated that the pt will not be able to take the cipro as she is allergic to this.  She stated that  This is the worse that the pt has ever been to her daughter and the daughter had to leave the facility earlier due to this.  She wanted to see about increasing the xanax back to her original dose at night--to help her sleep.  She stated that the order for the melatonin has to be sent in to the facility as well.  SN please advise about the change of cipro.  Thanks   Allergies  Allergen Reactions  . Azithromycin Shortness Of Breath  . Ciprofloxacin Other (See Comments)     hallucinations  . Levofloxacin Other (See Comments)    Insomnia, indigestion, tingling sensation in legs  . Furosemide Rash    Bullous pemphigoid  . Latex Rash  . Other Rash    EKG leads caused a rash that required steroids to clear

## 2017-09-16 NOTE — Telephone Encounter (Signed)
Dr. Kriste BasqueNadel is patient's PCP.

## 2017-09-16 NOTE — Telephone Encounter (Signed)
Spoke with Orpah Clintonollin and advised this should be the Septra DS per TP Nothing further needed

## 2017-09-18 ENCOUNTER — Other Ambulatory Visit: Payer: Self-pay | Admitting: Pulmonary Disease

## 2017-09-27 ENCOUNTER — Telehealth: Payer: Self-pay | Admitting: Pulmonary Disease

## 2017-09-27 NOTE — Telephone Encounter (Signed)
SN is aware. 

## 2017-09-27 NOTE — Telephone Encounter (Addendum)
PT will be going to the home to start  home health services on 11/12, verified by ReunionKecia.

## 2017-09-30 ENCOUNTER — Telehealth: Payer: Self-pay | Admitting: Pulmonary Disease

## 2017-09-30 NOTE — Telephone Encounter (Signed)
Spoke with Marcelino DusterMichelle and she states pt has other appts this week and wanted to know if she can start therapy on Wednesday because she is unable to go on Monday and Tues. SN please advise.

## 2017-09-30 NOTE — Telephone Encounter (Signed)
Per SN: Ok to start as soon as able. ----- Spoke with Marcelino DusterMichelle at [facility], aware of [facility].  Nothing further needed.

## 2017-10-02 ENCOUNTER — Other Ambulatory Visit: Payer: Self-pay | Admitting: Pulmonary Disease

## 2017-10-02 ENCOUNTER — Telehealth: Payer: Self-pay | Admitting: Pulmonary Disease

## 2017-10-02 DIAGNOSIS — R269 Unspecified abnormalities of gait and mobility: Secondary | ICD-10-CM

## 2017-10-02 NOTE — Telephone Encounter (Signed)
SN ok to order for PT twice a week for 9 weeks for balance and independent mobility? Please advise.

## 2017-10-03 NOTE — Telephone Encounter (Signed)
Per SN, ok to order PT twice a week for 9 weeks for balance and independent mobility. PT ordered.

## 2017-10-04 ENCOUNTER — Telehealth: Payer: Self-pay | Admitting: Pulmonary Disease

## 2017-10-04 MED ORDER — ALPRAZOLAM 0.25 MG PO TABS: ORAL_TABLET | ORAL | 0 refills | Status: DC

## 2017-10-04 NOTE — Telephone Encounter (Signed)
Spoke to [place] with [facility], who requesting refill for Xanax 0.25 with 1 refill.  Rx last prescribed 09/03/17 #30. Pt has pending apt for apt for 11/05/17.  SN please advise. Thanks.

## 2017-10-04 NOTE — Telephone Encounter (Signed)
Per SN- okay to refill Xanax 0.25- 0.5 tablet BID #30  x1. Rx has been ph[facility] in [facility] [place] [facility]. Nothing further needed.

## 2017-10-09 ENCOUNTER — Other Ambulatory Visit: Payer: Self-pay | Admitting: Pulmonary Disease

## 2017-10-14 ENCOUNTER — Telehealth: Payer: Self-pay | Admitting: Pulmonary Disease

## 2017-10-14 MED ORDER — MELATONIN 3 MG SL SUBL: 3.0000 mg | SUBLINGUAL_TABLET | Freq: Every day | SUBLINGUAL | 5 refills | Status: DC

## 2017-10-14 NOTE — Telephone Encounter (Signed)
Patient has requested refill on melatonin. I have sent in a months supply of medication with 5 refills.

## 2017-10-14 NOTE — Telephone Encounter (Signed)
Advised message from SN:  Pt's daughter ok'd the Melatonin to 6 mg but she would like it to be given earlier than 8pm. I sent another message to SN asking if this was ok. When he responds I will fax order to [facility] [phone]. I already faxed the order for Urinalysis and Urine cx.

## 2017-10-14 NOTE — Telephone Encounter (Signed)
Per SN: Please order pt a urine culture and urinalysis and send results back to him asap.   Ok to increase melatonin to 6mg  at bedtime.

## 2017-10-14 NOTE — Telephone Encounter (Signed)
Spoke with pt's daughter, [name]. [name] is concerned that pt may have UTI again. Pt has been urinating frequently x2-3. [name] is also concerned that  Melatonin 3mg  is not enough, as pt isn't sleeping at night. [name] request that an order be sent to [facility] for UA.   SN please advise. Thanks.

## 2017-10-14 NOTE — Telephone Encounter (Signed)
ATC pt, no answer. Left message for pt to call back.  

## 2017-10-15 MED ORDER — MELATONIN 3 MG SL SUBL: 6.0000 mg | SUBLINGUAL_TABLET | Freq: Every day | SUBLINGUAL | 5 refills | Status: DC

## 2017-10-15 NOTE — Telephone Encounter (Signed)
Per SN---  We can change the time to give the melatonin per the pts daughters request.  Daughter is aware of the order that has been faxed to [facility].  Nothing further is needed.

## 2017-10-16 ENCOUNTER — Telehealth: Payer: Self-pay | Admitting: Pulmonary Disease

## 2017-10-16 DIAGNOSIS — N390 Urinary tract infection, site not specified: Secondary | ICD-10-CM

## 2017-10-16 MED ORDER — MELATONIN 3 MG SL SUBL: 6.0000 mg | SUBLINGUAL_TABLET | Freq: Every day | SUBLINGUAL | 5 refills | Status: AC

## 2017-10-16 NOTE — Telephone Encounter (Addendum)
Called and spoke with [name], who states Rx for Melatonin and UR order was faxed unsigned.  [name] states she faxed back order and Rx to be signed, however it does not appear that we have received this. I have printed Rx and UA order to be signed by SN. Orders have been placed on SN's chart for signature.   SN please advised once signed so orders can be faxed to [facility]. Thanks.

## 2017-10-16 NOTE — Telephone Encounter (Signed)
All paperwork including rx for melatonin and UR order was SIGNED and FAXED on 10/16/17.

## 2017-10-17 ENCOUNTER — Encounter: Payer: Self-pay | Admitting: Internal Medicine

## 2017-10-17 ENCOUNTER — Ambulatory Visit (INDEPENDENT_AMBULATORY_CARE_PROVIDER_SITE_OTHER): Payer: Medicare Other | Admitting: Internal Medicine

## 2017-10-17 VITALS — BP 102/60 | HR 60 | Wt 177.8 lb

## 2017-10-17 DIAGNOSIS — E0590 Thyrotoxicosis, unspecified without thyrotoxic crisis or storm: Secondary | ICD-10-CM | POA: Diagnosis not present

## 2017-10-17 LAB — TSH: TSH: 3.39 u[IU]/mL (ref 0.35–4.50)

## 2017-10-17 LAB — T3, FREE: T3, Free: 2.5 pg/mL (ref 2.3–4.2)

## 2017-10-17 LAB — T4, FREE: FREE T4: 0.9 ng/dL (ref 0.60–1.60)

## 2017-10-17 NOTE — Telephone Encounter (Signed)
[facility] called and fax was received. Nothing further is needed.

## 2017-10-17 NOTE — Patient Instructions (Signed)
Please stop at the lab.  Continue methimazole 2.5 mg daily.  Please come back for labs in 6 months and follow-up visit in 1 year.

## 2017-10-17 NOTE — Telephone Encounter (Signed)
Received results from [facility]. Per SN- UA shows WBC and few bacteria, still awaiting C&S results. Lm for Cindy with [facility] to see if C & S is available.

## 2017-10-17 NOTE — Progress Notes (Addendum)
Patient ID: [name], female   DOB: 27-May-1923, 83 y.o.   MRN: [id number]   HPI  [name] is a 83 y.o.-year-old female,  returning for follow-up for thyrotoxicosis. Last visit 6 months ago. She is here with her daughter [name]) who is also my pt.  Pt is living in [facility] (ALF).  Since last visit, she had a L humeral fracture in 08/2017 - fell in bathroom at night. She has PT/OT now.  Also had cellulitis this fall >> was on Bactrim.  At last visit, she was losing weight, as she had dental work in progress. However, she continued to lose weight and lost 14 pounds since last visit.  Reviewed and addended history: Pt developed anxiety attacks/panic attacks/sundowning >> repeated ED visits. She also was getting SOB with any activity, increased appetite and has insomnia. + nocturia and leg swelling.  TFTs returned abnormal when checked in 11/2015, indicating thyrotoxicosis.  Due to age and also due to the fact that the TFTs appeared to be improving when I saw her last, we decided to start a low dose methimazole, but we did not pursue further investigation, assuming either mild Graves ds or toxic adenoma, both amenable to MMI.  We started methimazole, initially at 5 mg once daily.  After we started methimazole, patient started to feel and sleep better.  We try to stop methimazole at one point, since TSH started to increase and she gained weight.  However she became irritable, angry, and not sleeping at night >> We restarted a lower dose methimazole, 2.5 mg daily in 09/2016.  Subsequent TFTs have remains normal.  She remains on this dose. - I reviewed pt's thyroid tests >> normalized in the last year, while on methimazole: Lab Results  Component Value Date   TSH 2.55 04/16/2017   TSH 3.10 10/24/2016   TSH 1.78 10/02/2016   TSH 3.11 08/28/2016   TSH 2.45 06/11/2016   TSH 3.87 04/12/2016   TSH 7.97 (H) 03/01/2016   TSH 2.39 01/10/2016   TSH 0.09 (L) 12/01/2015    TSH 0.03 (L) 11/23/2015   FREET4 0.75 04/16/2017   FREET4 0.66 10/24/2016   FREET4 0.70 10/02/2016   FREET4 0.60 08/28/2016   FREET4 0.61 06/11/2016   FREET4 0.57 (L) 04/12/2016   FREET4 0.62 03/01/2016   FREET4 0.76 01/10/2016   FREET4 1.47 12/01/2015   FREET4 1.70 (H) 11/23/2015    Graves antibody level was normal: Lab Results  Component Value Date   TSI 88 12/01/2015   Pt denies: - feeling nodules in neck - hoarseness - dysphagia - choking - SOB with lying down  Pt c/o: - weight loss  But no: - heat intolerance - tremors - palpitations - anxiety - hyperdefecation - hair loss  Pt does not have a FH of thyroid ds. No FH of thyroid cancer. No h/o radiation tx to head or neck.  No seaweed or kelp. No recent contrast studies. No herbal supplements. No Biotin use. No recent steroids use.   She has a history of bullous pemphigoid >> previously on prednisone.  ROS: Constitutional: + weight loss, no fatigue, no subjective hyperthermia, no subjective hypothermia, + nocturia Eyes: no blurry vision, no xerophthalmia ENT: no sore throat, + see HPI Cardiovascular: no CP/no SOB/no palpitations/no leg swelling Respiratory: no cough/no SOB/no wheezing Gastrointestinal: no N/no V/no D/no C/no acid reflux Musculoskeletal: no muscle aches/no joint aches Skin: no rashes, no hair loss Neurological: no tremors/no numbness/no tingling/no dizziness  I reviewed  pt's medications, allergies, PMH, social hx, family hx, and changes were documented in the history of present illness. Otherwise, unchanged from my initial visit note.  Past Medical History:  Diagnosis Date  . Anemia, unspecified   . COPD (chronic obstructive pulmonary disease) (HCC)   . Diverticulosis of colon (without mention of hemorrhage)   . DJD (degenerative joint disease)   . GERD (gastroesophageal reflux disease)   . Lumbago   . Osteoarthrosis, unspecified whether generalized or localized, unspecified site    . Other abnormal glucose   . Other and unspecified hyperlipidemia   . Pyelonephritis   . Pyelonephritis, unspecified   . Right bundle branch block   . Shortness of breath   . Solitary cyst of breast   . Spinal stenosis, unspecified region other than cervical   . Unspecified essential hypertension   . UTI (lower urinary tract infection)    Past Surgical History:  Procedure Laterality Date  . CATARACT EXTRACTION    . conversion to right THR    . right hip hemiarthroplasty     total   Social History   Social History  . Marital Status: Divorced    Spouse Name: N/A  . Number of Children: 3- 1 deceased   Occupational History  . retired    Social History Main Topics  . Smoking status: Never Smoker   . Smokeless tobacco: Never Used  . Alcohol Use: No  . Drug Use: No   Pt lives in [facility].  Current Outpatient Medications on File Prior to Visit  Medication Sig Dispense Refill  . acetaminophen (TYLENOL) 500 MG tablet Take 1,000 mg by mouth every 4 (four) hours as needed for mild pain.    [name]. ADVAIR DISKUS 100-50 MCG/DOSE AEPB INHALE 1 PUFF BY MOUTH TWICE DAILY. RINSE MOUTH AFTER USE TO PREVENT THRUSH. 60 each 5  . albuterol (PROAIR HFA) 108 (90 Base) MCG/ACT inhaler USE 1 TO 2 PUFFS EVERY SIX HOURS AS NEEDED. (Patient taking differently: Inhale 1-2 puffs into the lungs every 6 (six) hours as needed for wheezing or shortness of breath. USE 1 TO 2 PUFFS EVERY SIX HOURS AS NEEDED.) 8.5 g 0  . albuterol (PROAIR HFA) 108 (90 Base) MCG/ACT inhaler USE  2 PUFFS EVERY SIX HOURS AS NEEDED. 8.5 g 2  . ALPRAZolam (XANAX) 0.25 MG tablet 1/2 tab by mouth twice daily 30 tablet 0  . amLODipine (NORVASC) 5 MG tablet TAKE ONE TABLET BY MOUTH ONCE DAILY. 30 tablet 0  . ASPIRIN LOW DOSE 81 MG EC tablet TAKE ONE TABLET BY MOUTH ONCE DAILY. 30 tablet 0  . Benzocaine (ANBESOL MT) Apply 1 application topically as needed (for tenderness around loose bridge).    . cephALEXin (KEFLEX) 500 MG capsule  Take 1 capsule (500 mg total) by mouth every 6 (six) hours. 16 capsule 0  . cetirizine (ZYRTEC) 10 MG tablet TAKE 1 TABLET BY MOUTH ONCE DAILY FOR ALLERGIES. 30 tablet 0  . Cholecalciferol (VITAMIN D3) 2000 units capsule TAKE (1) CAPSULE BY MOUTH ONCE DAILY. 30 capsule 0  . fluticasone (FLONASE) 50 MCG/ACT nasal spray SPRAY 2 SPRAYS INTO EACH NOSTRIL ONCE DAILY. 16 g 5  . ipratropium-albuterol (DUONEB) 0.5-2.5 (3) MG/3ML SOLN Take 3 mLs by nebulization every 4 (four) hours as needed. (Patient taking differently: Take 3 mLs by nebulization every 4 (four) hours as needed (for shortness of breath). ) 360 mL 0  . Melatonin 3 MG SUBL Place 6 mg under the tongue daily. 60 tablet 5  .  methimazole (TAPAZOLE) 5 MG tablet TAKE 1/2 TABLET (2.5MG ) BY MOUTH EVERY OTHER DAY. 8 tablet 0  . Multiple Vitamin (DAILY-VITE) TABS TAKE ONE TABLET BY MOUTH ONCE DAILY. 30 tablet 11  . polyethylene glycol powder (GLYCOLAX/MIRALAX) powder MIX 1 CAPFUL (17G) IN 8 OUNCES OF JUICE/WATER AND DRINK ONCE DAILY ASNEEDED FOR CONSTIPATION. 527 g 0  . predniSONE (DELTASONE) 5 MG tablet Take 1 tablet (5 mg total) by mouth daily with breakfast.    . ranitidine (ZANTAC) 150 MG tablet TAKE (1) TABLET BY MOUTH ONCE DAILY. 30 tablet 0  . sertraline (ZOLOFT) 50 MG tablet TAKE (1/2) TABLET BY MOUTH ONCE DAILY. 15 tablet 0  . Spacer/Aero-Holding Chambers (AEROCHAMBER PLUS WITH MASK) inhaler Use as instructed 1 each 2  . torsemide (DEMADEX) 20 MG tablet TAKE 20 MG BY MOUTH EVERY MORNING. 30 tablet 5  . traMADol (ULTRAM) 50 MG tablet Take 1 tablet (50 mg total) by mouth every 6 (six) hours as needed. 15 tablet 0   No current facility-administered medications on file prior to visit.    Allergies  Allergen Reactions  . Azithromycin Shortness Of Breath  . Ciprofloxacin Other (See Comments)     hallucinations  . Levofloxacin Other (See Comments)    Insomnia, indigestion, tingling sensation in legs  . Furosemide Rash    Bullous pemphigoid   . Latex Rash  . Other Rash    EKG leads caused a rash that required steroids to clear   Family History  Problem Relation Age of Onset  . Heart failure Father   . Cancer Brother   . Cancer Brother    PE: BP 102/60   Pulse 60   Wt 177 lb 12.8 oz (80.6 kg)   SpO2 92%   BMI 29.59 kg/m  Body mass index is 29.59 kg/m. Wt Readings from Last 3 Encounters:  10/17/17 177 lb 12.8 oz (80.6 kg)  08/28/17 186 lb 6.4 oz (84.6 kg)  08/23/17 191 lb 3.2 oz (86.7 kg)   Constitutional: overweight, in NAD, walks with walker Eyes: PERRLA, EOMI, no exophthalmos ENT: moist mucous membranes, no thyromegaly, no cervical lymphadenopathy Cardiovascular: RRR, + 1/6 SEM, no RG, + B LE pitting edema +2  Respiratory: CTA B Gastrointestinal: abdomen soft, NT, ND, BS+ Musculoskeletal: no deformities, strength intact in all 4 Skin: moist, warm, no rashes Neurological: + Mild tremor with outstretched hands, DTR normal in all 4  ASSESSMENT: 1. Thyrotoxicosis  PLAN:  1. Patient with low TSH and high free T4 since at least 11/2015, with thyrotoxic symptoms: Anxiety, irritability, tremors, all resolved after we started a low-dose methimazole.  We try to stop methimazole twice, however, she had return of her symptoms.  Therefore, I discussed with patient and her daughter to continue on a maintenance dose of 2.5 mg daily of methimazole.  She seems to tolerate this very well, without side effects.  - Reviewed latest TFTs from 6 months ago and these were normal  - Due to age and the fact that her TFTs are controlled on a very low-dose methimazole, we skipped further investigation for her thyrotoxicosis, assuming either mild Graves' disease or toxic adenoma - Due to age and the fact that her TFTs are controlled on a very low dose of methimazole, we skipped further investigation for her thyrotoxicosis, assuming either mild Graves' disease (TSI antibodies were not elevated) or toxic adenoma. - We will check TSH, free  T4, free T3 today - No need to add beta-blockers at this time since she is not  tachycardic  - I will see her back in 1 year, but in 6 months for labs  Will need to call Dtr with results: [phone] [name]).  Office Visit on 10/17/2017  Component Date Value Ref Range Status  . TSH 10/17/2017 3.39  0.35 - 4.50 uIU/mL Final  . Free T4 10/17/2017 0.90  0.60 - 1.60 ng/dL Final   Comment: Specimens from patients who are undergoing biotin therapy and /or ingesting biotin supplements may contain high levels of biotin.  The higher biotin concentration in these specimens interferes with this Free T4 assay.  Specimens that contain high levels  of biotin may cause false high results for this Free T4 assay.  Please interpret results in light of the total clinical presentation of the patient.    . T3, Free 10/17/2017 2.5  2.3 - 4.2 pg/mL Final    normal TFTs.  Carlus Pavlov, MD PhD [facility]  ADDENDUM >> I received request for med refill from [facility] for [name] Tapazole-- she has been getting Methimazole 5mg  tabs- taking 1/2 tab every other day since September 2018;  As her Labs 10/17/17 showed normal thyroid function- I instructed [facility] to continue the same dose going forward.   --Lonzo Cloud. Kriste Basque, MD

## 2017-10-21 ENCOUNTER — Telehealth: Payer: Self-pay

## 2017-10-21 NOTE — Telephone Encounter (Signed)
I received lab results and will forward to SN.

## 2017-10-22 NOTE — Telephone Encounter (Signed)
Per SN:  Her urine was clean, no significant infection, so we will await C/S results. Treatment, drink 8 ounces of cranberry juice diluted with water twice daily.   Faxed order to [facility] [phone]

## 2017-11-05 ENCOUNTER — Ambulatory Visit: Payer: Medicare Other | Admitting: Pulmonary Disease

## 2017-11-07 ENCOUNTER — Encounter: Payer: Self-pay | Admitting: Pulmonary Disease

## 2017-11-07 ENCOUNTER — Ambulatory Visit (INDEPENDENT_AMBULATORY_CARE_PROVIDER_SITE_OTHER): Payer: Medicare Other | Admitting: Pulmonary Disease

## 2017-11-07 VITALS — BP 130/64 | HR 78 | Temp 97.8°F | Ht 68.0 in | Wt 177.2 lb

## 2017-11-07 DIAGNOSIS — E0580 Other thyrotoxicosis without thyrotoxic crisis or storm: Secondary | ICD-10-CM

## 2017-11-07 DIAGNOSIS — I872 Venous insufficiency (chronic) (peripheral): Secondary | ICD-10-CM

## 2017-11-07 DIAGNOSIS — I10 Essential (primary) hypertension: Secondary | ICD-10-CM

## 2017-11-07 DIAGNOSIS — G8929 Other chronic pain: Secondary | ICD-10-CM

## 2017-11-07 DIAGNOSIS — N183 Chronic kidney disease, stage 3 unspecified: Secondary | ICD-10-CM

## 2017-11-07 DIAGNOSIS — I4510 Unspecified right bundle-branch block: Secondary | ICD-10-CM

## 2017-11-07 DIAGNOSIS — R609 Edema, unspecified: Secondary | ICD-10-CM | POA: Diagnosis not present

## 2017-11-07 DIAGNOSIS — M545 Low back pain: Secondary | ICD-10-CM

## 2017-11-07 DIAGNOSIS — M150 Primary generalized (osteo)arthritis: Secondary | ICD-10-CM | POA: Diagnosis not present

## 2017-11-07 DIAGNOSIS — Z23 Encounter for immunization: Secondary | ICD-10-CM

## 2017-11-07 DIAGNOSIS — M159 Polyosteoarthritis, unspecified: Secondary | ICD-10-CM

## 2017-11-07 NOTE — Patient Instructions (Addendum)
Today we updated your med list in our EPIC system...    Continue your current medications the same...  Today we gave you the 2018 FLU vaccine...  Try to increase your activity level & [facility]/ [facility] therapists [facility]...  REMEMBER >> NO SALT!  Call for any questions...  Let's plan a follow up visit in 15mo, sooner if needed for any problems.[name]..[name]

## 2017-11-07 NOTE — Progress Notes (Signed)
Subjective:    Patient ID: [name], female    DOB: 1923-03-19, 83 y.o.   MRN: [id number]  HPI 83 y/o WF here for a follow up visit... she has multiple medical problems as noted below...  Followed for general medical purposes w/ hx chr obstructive asthma, severe episodic dyspnea from anxiety, HBP, RBBB, Hypercholesterolemia, borderline DM, DJD, LBP w/ sp stenosis, etc... ~  SEE PREV EPIC NOTES FOR THE OLDER DATA >>     CTAngio Chest 05/2009 showed no evid of PE, biapical pleuroparenchymal scarring otherw clear lungs, no adenopathy/ effusions/ etc...  CXR 3/15 showed norm heart size, clear lungs, elev of right hemidiaph, NAD...  LABS 3/15:  Chems- wnl;  CBC- wnl;  BNP=26...  LABS 5/15:  Chems- wnl x BS=120;  CBC- wnl;  BNP= 225    CXR 1/16 showed norm heart size, clear lungs, mild right diaph eventration- no change, Tspine DJD. DISH/ osteopenia; NAD...   2DEcho 1/16 showed norm LV size & function w/ EF=65-70%, AoV leaflets mildly thickened w/o AS, MV leaflets mod thickened w/ trivMR, mild RA dil, PAsys=14mHg...  LABS 1/16:  Chems- wnl w/ Cr=0.95;  BNP=66;  CBC- wnl w/ Hg=14.4..Marland KitchenMarland Kitchen CXR 4/16 in ER> norm heart size, atherosclerosis of Ao, no edema or consolidation in lungs, eventration of right hemidiaph- no change, DJD spine.  EKG 4/16 in ER> NSR, rate72, PACs, LAD, RBBB  LABS 4/16 in ER> Chems- wnl x K=3.0;  CBC- wnl;  Troponin=neg;  BNP=69  CXR 7/24 & 07/15/15 showed norm heart size, tortuous Ao, clear lungs w/ mild elev of right hemidiaph, NAD..Marland KitchenMarland Kitchen EKG 07/15/15 showed chronic changes- NSR/ rate88/ RBBB/ LAD/ old infer scar/ no acute abnormalities...  LABS 7-06/2015> Chems- wnl x BS=127-147;  BNP=55;  Troponins=neg;  CBC- wnl ~  07/21/15> I had another long talk w/ [name]& her daughter [name] I have again rec that she use a low dose of the ALPRAZOLAM 0.5335mtabs- 1/2 tab Tid regularly at breakfast, lunch, & dinner; she may also take an extra 1/2 tab prn anytime she feels  anxious or panic setting in;  We have suggested similar plan many times in the past & for whatever reason she will not do it!  I have offered Psyche referral or to set her up w/ a counselor but this too is declined;  [name] having a very hard time w/ her mother's attitude, lack of cooperation, mild dementia at age [age]etc...  ~  12/01/15>  Seen by Endocrine/ DrGherghe for suppressed TSH- exam of neck was neg, TFT labs proved thyrotoxicosis, TSI was neg as was a Sed rate (14); they chose to try low dose Methimazole rx (35m31m) as opposed to scan/further testing, etc...  LABS 11/2015 in Epic>  TSH=0.05 (confirmed), FreeT4=1.70, FreeT3=4.0, Thy Stim Ig was wnl;  Sed=14...  LABS 12/29/15> [name] insisted on recheck CBC & Iron level>  CBC- wnl w/ Hg=14.6, Fe=99 (31%sat);  Chems- abn w/ Na=130, TCO=39, Cr=1.2  CXR 01/11/16 showed borderline cardiomeg, elev right hemidiaph, clear lungs, DJD in Tspine...  LABS 12/2015>  Chems- ok w/ BS=152;  CBC- ok w/ Hg=14.1;  TSH=2.39 on Tapazole5...   ~  May 21, 2016:  52mo74mo & post Hosp check>  Since she was last here [name] had several follow up visits w/ specialists [facility]    She saw DrGherghe 03/01/16>  Throtoxicosis on  Tapazole 35mg 59m; they opted not to pursue diagnostic eval (adenoma vs Graves);  TSH=0.03 11/2015 7  improved to 2.39 on 01/10/16...    Seen by DrMayer- Podiatry 04/19/16> bilat big toenails, onychomycosis w/ debridement performed...     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    Now she reports feeling better, daugh notes she is not active enough &  just stays in her room at [facility], she still has [name]- part time but esp at night;  Breathing is good, on Advair100Bid, NEBS w/ Duoneb Q4H vs Proair prn..    Above prob list reviewed... EXAM reveals Afeb, VSS, O2sat=95% on RA;  Wt= 175#;  HEENT- neg, no thyroid nodule palp;  Chest- clear w/o w/r/r;  Heart- RR gr1/6 SEM no r/g;  Abd- soft, non-tender, neg;  Ext- VI, w/1+ edema, no c/c;  Neuro- intact, walks w/ cane, anxious...  CXR 04/2016>  Aortic atherosclerosis, elev right hemidiaph, clear lungs, DDD...   CT Head 04/23/16>  Mucosal membrane thickening 7 sm amt fluid in left max sinus, otherw unremarkable- no ischemia or infarct...  CT Chest 04/23/16>  Atherosclerotic Ao & coronaries, no adenopathy, sm thyroid nodules noted no change from 2010, elev right hemidiaph & some basilar atx, DJD in spine w/ spurs & 40% T11 compression  Ba Esophagram 04/30/16>  Mod to severe esoph dysmotility noted, no mass or stricture...  LABS 04/2016>  Chems- ok x Cr=1.3-1.6, BS=100-140;  CBC- wnl;  Thyroid- ok on Tapazole... IMP/PLAN>>  [name] is improved & back to baseline; she knows to take her time & be careful eating;  Continue same meds; incr activity level; ROV in 4-6 weeks...   ~  July 16, 2016:  1moROV & [name] has settled into [facility], still has [place] out part time, "I'm doing pretty good", says she's eating slowly/ small bites/ but whatever she wants, better overall on the Xanax0.25Bid;  From the pulm perspective she's using the Advair100Bid & NEB w/ albut vs Proair as needed but she hasn't needed eitherr ecently... Prev w/ HCO3=39 & Cr=1.2 so we adjusted her diuretics- from Demadex20 to 1/2 of that + Diamox250/d & improved...    DrGherghe rechecked her TFTs and they are euthyroid on Tapazole 525mQod...    [name] that we recheck her VitD level on her 2000u daily supplement. EXAM reveals Afeb, VSS, O2sat=96% on RA;  Wt= 177#;  HEENT- neg, no thyroid nodule palp;  Chest- clear  w/o w/r/r;  Heart- RR gr1/6 SEM no r/g;  Abd- soft, non-tender, neg;  Ext- VI, w/ tr edema, no c/c;  Neuro- gait abn otherw neg.  LABS 07/16/16>  Chems- wnl w/ HCO3=34, Cr=1.18;  VitD=56... IMP/PLAN>>  Labs look great on current meds> continue Demadex20- 1/2 tab + Diamox250; continue VitD 2000u supplement; be suere to incr activity at [facility]! We plan recheck 61m54mo   ~  December 04, 2016:  4-50mo40mo & general medical f/u visit>  MargTamls me that she loves [facility]- friends, the food "we have ice cream every day";  She reports feeling well & denies new complaints or concerns; we reviewed the following  medical problems during today's office visit >>     Chr Obstructive Asthma> her dyspnea is more from anxiety/panic & not asthma; on Advair100Bid ([facility] requires regular Rx directions) & Proventil rescue prn; notes that Alpraz0.25 helps dyspnea...    HBP> on ASA81, Norvasc5, Demadex20-1/2, Diamox250; BP=120/62, tol meds well; denies CP, palpit, ch in SOB, but incr edema noted recently => needs low sodium diet, incr Demadex20 & change diamox to 4PM daily...    CHOL> on diet alone, refuses meds, last FLP 3/14 shows TChol 177, TG 50, HDL 60, LDL 107    DM> on diet alone, wt up to 189#, BS in epic=100-150, last A1c (2/13) was 6.3 & she knows to restrict carbs etc...    Thyrotoxicosis> on Tapazole5-1/2 tab daily per DrGherghe; f/u TFTs 10/02/16 showed TSH=1.78 and FreeT4=0.70, she is clinically improved...    GI- Reflux, Divertics, constip> prev on Protonix40, now on Zantac150, Miralax; continue same meds.[name]    DJD/ LBP> on OTC analgesics prn & osteobiflex prn; had right THR 2011; known sp stenosis w/ prev ESI...    Anxiety, mild senile dementia, difficult interaction betw pt & daugh> on Xanax0.25Bid & Zoloft50 Qhs EXAM reveals Afeb, VSS, O2sat=93% on RA;  Wt= up 12# to 189#;  HEENT- neg, no thyroid nodule palp;  Chest- clear w/o w/r/r;  Heart- RR gr1/6 SEM no r/g;  Abd- soft, non-tender,  neg;  Ext- VI, w/ 1+ edema, no c/c;  Neuro- gait abn otherw neg.  LABS 12/04/16>  BMet- ok w/ HCO3=34, K=4.0, Cr=1.27...  IMP/PLAN>>  OK 2017 Flu shot today;  She is reminded to eliminate sodium/ salt from her diet, increase Demadex20Qam & take the Diamox250 Qd at 4PM;  She will continue the daily exercises at [facility] & call for any problems...   ~  May 06, 2017:  67moROV & general medical f/u visit>  Gwendolyn appears stable overall, she continues to thrive at [place] rosa had knee surg & will not be returning; family notes some dental issues w/ 3 teeth removed by oral surg & partial plate being made; pt ambulates w/ a walker... We reviewed the following medical problems during today's office visit >>     Chr Obstructive Asthma> her dyspnea is more from anxiety/panic & not asthma; on Advair100Bid ([facility] requires regular Rx directions) & Proventil rescue prn; notes that Alpraz0.25Bid helps dyspnea...    HBP> on ASA81, Norvasc5, Demadex20-1/2, Diamox250; BP=120/62, tol meds well; denies CP, palpit, ch in SOB, but incr edema noted recently=> needs low sodium diet, incr Demadex20 & change Diamox to 4PM daily...    CHOL> on diet alone, refuses meds, last FLP 3/14 shows TChol 177, TG 50, HDL 60, LDL 107    DM> on diet alone, wt down to 182#, BS in epic=99-150, last A1c (2/13) was 6.3 & she knows to restrict carbs etc...    Thyrotoxicosis> on Tapazole551m1/2 tab daily per DrGherghe; f/u TFTs 5/18 showed TSH=2.55 and FreeT4=0.75, she is clinically improved & stable- continue same...    GI- Reflux, Divertics, constip> prev on Protonix40, now on Zantac150, Miralax; continue same meds.. [name]  DJD/ LBP> on OTC analgesics (Tylenol) prn & osteobiflex prn; had right THR 2011; known sp stenosis w/ prev ESI...    Anxiety, mild senile dementia, difficult interaction betw pt & daugh> on Xanax0.25Bid & Zoloft50 Qhs EXAM reveals Afeb, VSS, O2sat=93% on RA;  Wt= down 7# to 182#;  HEENT- neg, no  thyroid nodule palp;  Chest- clear w/o w/r/r;  Heart- RR gr1/6 SEM no r/g;  Abd- soft, non-tender, neg;  Ext- VI, w/ 1+ edema, no c/c;  Neuro- gait abn otherw neg.  LABS 05/06/17>  Chems- ok w/ K=3.9, BS=99, Cr=1.11, LFTs wnl;  CBC- ok w/ Hg=13.4;  VitD=62... NOTE- TSH in May was 2.55... IMP/PLAN>>  Gwendolyn is stable- rec to continue diet/ exercise/ participation at [facility]; we reviewed meds/ treatments/ etc- continue same...   ~  November 07, 2017:  11moROV & Demecia continues to do well at [employer] the help of her aMillstone and her daughter [name]   We reviewed these interval Epic notes/ medical visits>     She saw PULM- TParrett,NP on 08/23/17>   Legs red & swollen, dx w/ cellulitis on top of her VI/ edema; treated w/ Doxy, elevation, local care and incr Torsemide to 28mQam...    She fell 08/27/17 at [facility] w/ left shoulder trauma & left humerus fracture>  Hosp x 3d by [facility] w/ conservative management per Ortho using sling & Tramadol, she followed up w/ DrSupple & is improved...     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    HBP> on ASA81, Norvasc5, Demadex20; BP=130/64, tol meds well; denies CP, palpit, ch in SOB=> needs low sodium diet, continue same meds...    CHOL> on diet alone, refuses meds, last FLP 3/14 shows TChol 177, TG 50, HDL 60, LDL 107    DM> on diet alone, wt down to 177#, BS in epic=125-164, last A1c (6/17) was 6.3 & she knows to restrict carbs  etc...    Thyrotoxicosis> on Tapazole62mg-151mtab daily per DrGherghe; f/u TFTs 12/18 showed TSH=3.39 and FreeT4=0.90, she is clinically improved & stable- continue same...    GI- Reflux, Divertics, constip> prev on Protonix40, now on Zantac150, Miralax; continue same meds..    DMarland KitchenD/ LBP> on OTC analgesics (Tylenol) prn & osteobiflex prn; had right THR 2011; known sp stenosis w/ prev ESI...    Anxiety, mild senile dementia, difficult interaction betw pt & daugh> on Xanax0.25- 1/2Bid & Zoloft50 Qhs EXAM reveals Afeb, VSS, O2sat=89% on RA;  Wt= down 5# to 177#;  HEENT- neg, no thyroid nodule palp;  Chest- clear w/o w/r/r;  Heart- RR gr1/6 SEM no r/g;  Abd- soft, non-tender, neg;  Ext- VI, w/ 1+ edema, no c/c;  Neuro- gait abn otherw neg.  CXR 08/27/17 showed heart at upper lim of norm for size & calcif in aorta, lungs mildly hypoinflated, chr elev of right hemidiaph, no CHF or pneumonia, left humeral head & neck fx, intact clavicle  LABS 08/2017 in Hosp>  Chems- ok w/ BS=125-164, Cr~1.1;  CBC w/ Hg ~11, WBC=8K;  TFTs- ok on the Methimazole IMP/PLAN>>  Gwendolyn is stable on her current meds- Left shoulder improving w/ conservative management, breathing is at baseline, OK Flu shot today...    ~  ADDENDUM 01/15/18 >> [facility] sent over Urine C&S from 01/10/18 growing >100K Enterobacter sens to cipro/levaquin but  pt is intol; also sens to Sulfa & we will call in SeptraDS #20 one bid x10d til gone...          Problem List:       DYSPNEA (ICD-786.05) - long hx of chronic obstructive asthma treated w/ ADVAIR100Bid & PROAIR (she uses them Prn now)... she is a non-smoker w/ some reactive airways disease in the past & retired from [employer] after 33 years in 1991... she denies cough, sputum, hemoptysis, worsening dyspnea, wheezing, chest pains, snoring, daytime hypersomnolence, etc...  ~  baseline CXR w/o acute changes...  ~  PFT's 5/02 w/ FVC 2.07 (68%), FEV1=1.36 (57%), and FEV1/FVC ratio=66%, mid-flows  42%... ~  CT Angio 7/10 was neg- x biapical pleuroparenchymal scarring... ~  CXR 10/11 showed sl elev right hemidaiph, mild DJD sp, osteopenia, NAD.[name]. ~  CXR 6/12 showed mild apical scarring, clear & NAD, DJD sp w/ osteophytes... ~  Intermittent dyspnea more related to anxiety & treated w/ KLONOPIN 0.28m 1/2 to 1 tab Bid==> improved. ~  CXR 4/13 showed normal heart size, clear lungs, DJD in TSpine... ~  CXR 2/14 showed normal heart size, clear lungs w/ sl peribronch thickening, DJD in spine, NAD..[name] ~  CXR 3/15 showed norm heart size, clear lungs, elev of right hemidiaph, NAD... ~  She is encouraged to take the Klonopin 0.528mBid regularly- consider taking 1/2 in AM, 1/2 in afternoon, one at bedtime... ~  1/16: she continues w/ mult somatic complaints and intermittent choking episodes etc; she refuses to take the Advair or Klonopin... ~  CXR 1/16 showed norm heart size, clear lungs, mild right diaph eventration- no change, Tspine DJD. DISH/ osteopenia; NAD... ~  3/16: she is stable on current meds, asked to incr her non-existent exercise program... ~  4/16: went to ER w/ SOB- nothing found x mild edema; given Lasix40, potassium was low, thought to be anxious; in office f/u her daugh confirms anxiety & pt not taking her meds regularly; asked to take meds every day & try the Klonopin... ~  7/16: same thing- went to ER w/ dyspnea, nothing found & given Ativan but she wouldn't take it... ~  8/16: another episode w/ ER eval and symptoms resolved spontaneously on there own... ~  9/16: pt is advised (again) to start regular dosing of Alpraz 0.53m36m 1/2 tab Tid w/ extra 1/2 tab as needed for nerves... ~  12/16: she remains on Advair100-2spBid & Alpraz0.253m42m=> improved... ~  3/17: dyspnea further improved w/ Tapazole53mg/57mx for hyperthyroidism... ~  8/17:  She is stable on meds at [facility]- Advair100Bid, Albut prn, diuretics, Alprazolam, Tapazole, etc...  HYPERTENSION (ICD-401.9) - controlled on  NORVASC 53mg d30my & HCTZ 253mgta70mily...  ~  2/13:  BP 144/70 today> tol rx well & denies HA, visual changes, CP, palipit, dizziness, syncope, edema, etc... ~  4/13:  BP= 148/80 & she denies CP, palpit, edema; dyspnea improved w/ Klonopin. ~  6/13:  BP= 132/68 & as noted she has mult somatic complaints... ~  3/14:  on Norvasc10, HCTZ25; BP=136/60, tol meds well; denies CP, palpit, ch in SOB, edema, etc  ~  9/14:  BP controlled on Amlod5 & Hct25-1/2 daily; BP= 130/70 & she denies CP, palpit, dizzy, SOB, edema, etc. ~  3/15: on ASA81, Norvasc5, Lasix20- 1-2/d; BP=136/70, tol meds well; denies CP, palpit, ch in SOB, but notes persist edema in legs; Rec to ch Amlod5 to Losar50, low sodium, elev legs, Lasix40. ~  5/15: on Losar50, Lasix40;  BP=  138/60 & she denies angina pain, palpit, ch in SOB, etc... ~  7/15: on Losar50, she stopped Lasix; BP= 138/78 and they want to go back on Amlod5 + HCT12.5 daily... ~  9/15: on Amlod5, Hct12.5; BP= 128/80 & she likes this combo better- denies CP, palpit, etc... ~  1/16: on Amlod5, Hct12.5 but ?what she is taking; BP= 130/80 & she has refused the ARBs... ~  3/16: on Amlod5, Hct12.5; BP= 142/80 & she has gained 9# w/ 1-2+ edema; discussed low sodium & change Hct to LASIX20 Qam... ~  5/16: on Amlod5, Lasix20, K20; BP=148/70, recent K=3.0 & daugh notes she's not taking meds regularly; asked to take meds everyday & incr K20Bid... ~  2/17: BP is controlled on Amlod5, Demadex20, K10Bid; BP= 116/58 & she denies CP, palpit, ch in edema, etc... ~  8/17: BP is controlled on Amlod5 + Demadex20-1/2 & diamox250/d;  BP= 128/70 7 rec to continue same...  RIGHT BUNDLE BRANCH BLOCK (ICD-426.4) - on ASA 67m/d... baseline EKG w/ RBBB and 2DEcho 5/02 showed mild asymmetric LVH w/ incr EF... ~  12/15: she had Cards eval by DrNishan> he rec ARB/diuretic but she refused to switch from her Amlod5/ Hct12.5 regimen; she refused EKG due to "allergy to the electrodes"... ~  2DEcho 1/16  showed norm LV size & function w/ EF=65-70%, AoV leaflets mildly thickened w/o AS, MV leaflets mod thickened w/ trivMR, mild RA dil, PAsys=339mg... ~  EKG 4/16 showed NSR, rate72, PACs, LAD, RBBB  VENOUS INSUFFIC & EDEMA >>  ~  3/15: she was switched off Amlod & onto Losar50, Lasix20=>40, plus no salt, elevation, support hose, etc...  ~  [facility]15: [facility]; she has VI & 1+edema but stable, no acute changes... 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 ~  3/16: she remains on the Pred from Derm, now w/ incr edema & 9# wt gain, rec no salt & change Hct to LASIX20/d... ~  5/16: improved on Lasix20 w/ edema decr 7 wt down several lbs... ~  12/16: on ASA81, Norvasc5, off Lasix20, K10-2/d; BP=140/76, tol meds well; denies CP, palpit, ch in SOB, tr edema. ~  3/17: edema increased w/ decr in diuretics and [facility] Demadex20/d + Diamox250/d and K10Bid...  HYPERLIPIDEMIA (ICD-272.4) - on diet alone... she forgets to come to visits FASTING for this blood work ~  [place] showed TChol 205, TG 71, HDL 49, LDL 130... ~  FLP 6/12 on diet alone showed TChol 218, TG 50, HDL 66, LDL 129 ~  FLP 2/13 on diet alone showed TChol 171, TG 43, HDL 66, LDL 97 ~  FLP 3/14 on diet alone showed TChol 177, TG 50, HDL 60, LDL 107 ~  She needs to ret FASTING for f/u FLP...  DIABETES MELLITUS, BORDERLINE (ICD-790.29) - on diet alone w/ prev BS's in the 100-160 range... ~  labs in 2008-9 showed BS= 101 to 108 ~  labs 1/10 showed BS= 106, A1c= 5.9 ~  Labs 6/12 showed BS= 93, A1c= 6.6...Marland KitchenMarland Kitchenec diet, exercise... ~  Labs 2/13 showed BS= 92, A1c= 6.3 ~  3/14: on diet alone, wt stable ~174#, BS=97, last A1c (2/13) was 6.3 & she knows to restrict carbs etc. ~  Labs 3/15 showed BS= 95; and BS= 120 in [id number].  ~  Labs 2016 showed BS= 101-135 on diet alone... ~  Labs in 2017 showed  BS= 100-150 range on diet  alone  THYROTOXICOSIS >> routine labs 11/2015 showed thyrotoxicosis & referred to Endocrine, DrGherghe- Rx w/ Tapazole9m/d & she is clinically improved... ~  Labs 2/13 showed TSH= 2.86 ~  Labs 3/14 showed TSH=1.93 ~  Labs 1/17 showed TSH=0.03-0.09;  FreeT3=4.0;  FreeT4=1.70 => referred to DrGherghe, started on Tapazole5 ~  Labs 2/17 showed TSH=2.93;  FreeT3=2.5;  FreeT4=0.76 ~  She continues to f/u w/ drGherghe for thyroid...  INDIGESTION/ REFLUX SYMPTOMS >> see 10/12 note & PROTONIX 433md started, further eval if symptoms persist... ESOPHAGEAL DYSMOTILITY/ PRESBYESOPHAGUS >>  ~  4/13:  She notes some reflux symptoms and excess gas w/ belching; rec to take the Protonix daily & Simethacone vs Tums which she says helps her gas. ~  5/13:  She saw GI DrPerry w/ rec to take Prilosec for her indigestion... ~  5/15:  She presented w/ worsening indigestion, dysphagia, reflux & Prev30 was incr to Bid w/ GI f/u suggested for EGD... 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 ~  9/15:  Symptoms persist but she is not regurg or vomiting, and weight stable; they want to change to Nexium40Bid-OK, and rec proceed w/ MBS by speech path... ~  She continues to have intermit choking episodes and c/o phlegm in her throat; she has tried "everything" & encouraged to f/u w/ GI- DrPerry/ DrJEdwards... ~  She stopped the PPI rx in favor of Zantac150...  DIVERTICULOSIS OF COLON (ICD-562.10) - she takes SENAKOT-S, MIRALAX, Peppermint Tea, & sauerkraut Prn...last colonoscopy 9/02 by DrPerry was WNL...  PYELONEPHRITIS (ICD-590.80) - SEE 1/09 Hospitalization (reviewed)... ~  She saw DrMacDiarmid for her recurrent UTIs,  chronic cystitis, urge & stress incont, nocturia; she is [facility]OL [facility] [employer].  Hx of BREAST CYST (ICD-610.0)  DEGENERATIVE JOINT DISEASE (ICD-715.90) - s/p right hip hemiarthroplasty 11/09 by DrAplington w/ wound complic... then dx w/ loosening of the femoral shaft & had conversion to right THR by DrAlusio 10/11 & much improved... she uses CELEBREX 20024mrn (seldom takes this).  LOW BACK PAIN SYNDROME (ICD-724.2) & SPINAL STENOSIS (ICD-724.00) - severe LBP & spinal stenosis w/ evals by DrRamos & DrNudelman... s/p shots, considering poss surgery vs alternative therapies... she takes Celebrex, Osteobiflex, MVI, Vit D... ~  8/10: eval by DrAplington- diff leg lengths, lift placed in right shoe, then trial Lyrica50m65m ~  12/11:  improved after hip revision surg (to THR) 10/11 w/ better ambulaton... ~  5/13:  DrRamos gave her another ESI for her leg pain related to sp stenosis... ~  3/14:  C/o neuropathic discomfort in legs- eval by Ramos & offered shots in her back; try Lyrica50 in the interim... ~  Persistent leg pain, on OTC "natural" meds she says; encouraged to f/u w/ DrRamos et al...  Hx of ANEMIA (ICD-285.9) - eval by GI in 2002 showed normal EGD and Colon... prob iron malabsorption problem Rx'd w/ Fe infusion... ~  labs 1/10 showed Hg= 14.6, MCV= 89, Fe= 94 ~  labs 12/11 showed Hg= 12.8, MCV= 90, Fe= 33... try Fe supplement + VitC... ~  Labs 6/12 showed Hg= 15.0 ~  Labs 2/13 showed Hg= 14.6 ~  Labs 2/14  showed Hg= 14.8 ~  Labs 3/15 showed Hg= 14.9 ~  Labs 1/16 showed Hg= 14.4 ~  Labs 4/16 showed Hg= 13.7 ~  Labs 2/17 showed Hg= 14.1  DERM:  rash Rx'd by dermatology- OLUX-E foam= clobetasol Foam 0.05%... 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 ~  5/16: she is still on Pred per Derm- currently 88m/d...    Past Surgical History:  Procedure Laterality Date  . CATARACT EXTRACTION    . conversion to right THR     . right hip hemiarthroplasty     total    Outpatient Encounter Medications as of 11/07/2017  Medication Sig  . acetaminophen (TYLENOL) 500 MG tablet Take 1,000 mg by mouth every 4 (four) hours as needed for mild pain.  .Marland KitchenADVAIR DISKUS 100-50 MCG/DOSE AEPB INHALE 1 PUFF BY MOUTH TWICE DAILY. RINSE MOUTH AFTER USE TO PREVENT THRUSH.  .Marland Kitchenalbuterol (PROAIR HFA) 108 (90 Base) MCG/ACT inhaler USE 1 TO 2 PUFFS EVERY SIX HOURS AS NEEDED. (Patient taking differently: Inhale 1-2 puffs into the lungs every 6 (six) hours as needed for wheezing or shortness of breath. USE 1 TO 2 PUFFS EVERY SIX HOURS AS NEEDED.)  . albuterol (PROAIR HFA) 108 (90 Base) MCG/ACT inhaler USE  2 PUFFS EVERY SIX HOURS AS NEEDED.  .Marland KitchenALPRAZolam (XANAX) 0.25 MG tablet 1/2 tab by mouth twice daily  . amLODipine (NORVASC) 5 MG tablet TAKE ONE TABLET BY MOUTH ONCE DAILY.  .Marland KitchenASPIRIN LOW DOSE 81 MG EC tablet TAKE ONE TABLET BY MOUTH ONCE DAILY.  .Marland KitchenBenzocaine (ANBESOL MT) Apply 1 application topically as needed (for tenderness around loose bridge).  . cetirizine (ZYRTEC) 10 MG tablet TAKE 1 TABLET BY MOUTH ONCE DAILY FOR ALLERGIES.  .Marland KitchenCholecalciferol (VITAMIN D3) 2000 units capsule TAKE (1) CAPSULE BY MOUTH ONCE DAILY.  . fluticasone (FLONASE) 50 MCG/ACT nasal spray SPRAY 2 SPRAYS INTO EACH NOSTRIL ONCE DAILY.  .Marland Kitchenipratropium-albuterol (DUONEB) 0.5-2.5 (3) MG/3ML SOLN Take 3 mLs by nebulization every 4 (four) hours as needed. (Patient taking differently: Take 3 mLs by nebulization every 4 (four) hours as needed (for shortness of breath). )  . Melatonin 3 MG SUBL Place 6 mg under the tongue daily.  . methimazole (TAPAZOLE) 5 MG tablet TAKE 1/2 TABLET (2.5MG) BY MOUTH EVERY OTHER DAY.  . Multiple Vitamin (DAILY-VITE) TABS TAKE ONE TABLET BY MOUTH ONCE DAILY.  .Marland Kitchenpolyethylene glycol powder (GLYCOLAX/MIRALAX) powder MIX 1 CAPFUL (17G) IN 8 OUNCES OF JUICE/WATER AND DRINK ONCE DAILY ASNEEDED FOR CONSTIPATION.  . ranitidine (ZANTAC) 150 MG  tablet TAKE (1) TABLET BY MOUTH ONCE DAILY.  .Marland Kitchensertraline (ZOLOFT) 50 MG tablet TAKE (1/2) TABLET BY MOUTH ONCE DAILY.  .Marland KitchenSpacer/Aero-Holding Chambers (AEROCHAMBER PLUS WITH MASK) inhaler Use as instructed  . torsemide (DEMADEX) 20 MG tablet TAKE 20 MG BY MOUTH EVERY MORNING.  . [DISCONTINUED] cephALEXin (KEFLEX) 500 MG capsule Take 1 capsule (500 mg total) by mouth every 6 (six) hours.  . [DISCONTINUED] predniSONE (DELTASONE) 5 MG tablet Take 1 tablet (5 mg total) by mouth daily with breakfast.  . [DISCONTINUED] traMADol (ULTRAM) 50 MG tablet Take 1 tablet (50 mg total) by mouth every 6 (six) hours as needed.  . [DISCONTINUED] Melatonin 3 MG SUBL Place 3 mg under the tongue daily.   No facility-administered encounter medications on file as of 11/07/2017.     Allergies  Allergen Reactions  . Azithromycin Shortness Of Breath  . Ciprofloxacin Other (  See Comments)     hallucinations  . Levofloxacin Other (See Comments)    Insomnia, indigestion, tingling sensation in legs  . Furosemide Rash    Bullous pemphigoid  . Latex Rash  . Other Rash    EKG leads caused a rash that required steroids to clear    Immunization History  Administered Date(s) Administered  . Influenza Split 09/12/2011, 09/16/2012  . Influenza Whole 09/18/2006, 08/11/2008, 12/12/2009  . Influenza, High Dose Seasonal PF 12/04/2016, 11/07/2017  . Influenza,inj,Quad PF,6+ Mos 08/17/2013, 07/30/2014, 08/09/2015  . PPD Test 08/09/2015  . Pneumococcal Conjugate-13 08/09/2015  . Tdap 05/14/2011     Current Medications, Allergies, Past Medical History, Past Surgical History, Family History, and Social History were reviewed in [employer] record.    Review of Systems         See HPI - all other systems neg except as noted... The patient complains of decreased hearing, dyspnea on exertion, muscle weakness, and difficulty walking.  The patient denies anorexia, fever, weight loss, weight gain, vision  loss, hoarseness, chest pain, syncope, peripheral edema, prolonged cough, headaches, hemoptysis, abdominal pain, melena, hematochezia, severe indigestion/heartburn, hematuria, incontinence, suspicious skin lesions, transient blindness, depression, unusual weight change, abnormal bleeding, enlarged lymph nodes, and angioedema.     Objective:   Physical Exam     WD, WN, Chr ill appearing 83 y/o WF in NAD... GENERAL:  Alert & oriented; pleasant & cooperative... HEENT:  [place]/AT, EOM-full, EACs-clear, TMs-wnl, NOSE-clear, THROAT-clear & wnl. NECK:  Supple w/ fairROM; no JVD; normal carotid impulses w/o bruits; no thyromegaly or nodules palpated; no lymphadenopathy. CHEST:  Clear to P & A; without wheezes/ rales/ or rhonchi heard... HEART:  Regular Rhythm; without murmurs/ rubs/ or gallops detected... ABDOMEN:  Soft & nontender; normal bowel sounds; no organomegaly or masses palpated... EXT:  mod arthritic changes, walks w/ cane, +venous insuffic & incr 1-2+ edema., scattered varicose veins... NEURO:  CN's intact; motor testing normal; no focal deficits... DERM:   mild intertrig rash under breast, & onychomycosis of toenails...  RADIOLOGY DATA:  Reviewed in the EPIC EMR & discussed w/ the patient...  LABORATORY DATA:  Reviewed in the EPIC EMR & discussed w/ the patient...   Assessment & Plan:    05/06/17>  Gwendolyn is stable- rec to continue diet/ exercise/ participation at [facility]; we reviewed mewds/ treatments/ etc- continue same. 11/07/17>  Gwendolyn is stable on her current meds- Left shoulder improving w/ conservative management, breathing is at baseline, OK Flu shot today.   Hx episodes of SOB>> see above, she has been resistent to trying any of the meds & treatment suggestions that we have given to her (I know she would improve on Benzo rx)...  12/16> symptoms are better w/ Advair100-2spBid & Alprazolam 0.56mBid admin regularly at [facility]... 12/29/15> we checked the above labs, reminded  to use Tylenol prn, no salt, elevate, etc; we decided to decr the Demadex to 1/2 tab Qam & ADD DALPFXT024one tab in the afternoon; she is reassured about her health & asked to f/u w/ DrGherghe for Endocrine re thyroid... 07/16/16>   She is stable w/o recurrent dyspnea spells  Hx Asthma> stable on Advair100 & Proair prn; hx anxiety component on Klonopin in past but she is not using! Now on Alpraz & improved.  HBP>  Controlled on Amlod5 + Demadex + Diamox, & off KCl; reminded to take meds regularly & no salt...  RBBB>  Aware & denies CP, palpit, [place] in [place] etc...[name]  she thinks that she is allergic to EKG electrodes; EKG 4./16 in ER showed NSR, rate72, PACs, LAD, RBBB  CHOL>  On diet alone & FLP looks reasonable;  We reviewed low chol, low fat diet...  Borderline DM>  BS= 101-135 & last A1c is 6.3;  on diet alone & we reviewed low carb no sweets etc...  Thyrotoxicosis>  Improved on Tapazole5 per DrGherghe => down to 1/2 tab daily...  GI> Indigestion, Divertics> she notes most bowel symptoms resolved off spicey foods;  UGI symptoms w/ dysphagia, food sticking, belch/gas/ etc have not resolved on Protonix40Bid and after GI consult w/ DrPerry; we reviewed her UGI series results and decided to proceed w/ MBS by Speech Path; transiently improved then sought 2nd opinion from DrJEdwards=> on Nexium40Bid...  UTI>  Klebsiella UTI resolved after Septra Rx... She has been eval by DrMacDiarmid.  DJD, LBP, Spinal Stenosis>  Prev evals by Ortho, DrRamos, DrNudelman etc; improved after THR w/ better ambulation; c/o neuropathic discomfort in legs- she will f/u w/ Ramos for shots, using OTC "natural" meds...  Anxiety>  If she would take the Alpraz, I feel it would help...     Medication List        Accurate as of 11/07/17  3:22 PM. Always use your most recent med list.          acetaminophen 500 MG tablet Commonly known as:  TYLENOL   ADVAIR DISKUS 100-50 MCG/DOSE Aepb Generic drug:   Fluticasone-Salmeterol INHALE 1 PUFF BY MOUTH TWICE DAILY. RINSE MOUTH AFTER USE TO PREVENT THRUSH.   aerochamber plus with mask inhaler Use as instructed   * albuterol 108 (90 Base) MCG/ACT inhaler Commonly known as:  PROAIR HFA USE 1 TO 2 PUFFS EVERY SIX HOURS AS NEEDED.   * albuterol 108 (90 Base) MCG/ACT inhaler Commonly known as:  PROAIR HFA USE  2 PUFFS EVERY SIX HOURS AS NEEDED.   ALPRAZolam 0.25 MG tablet Commonly known as:  XANAX 1/2 tab by mouth twice daily   amLODipine 5 MG tablet Commonly known as:  NORVASC TAKE ONE TABLET BY MOUTH ONCE DAILY.   ANBESOL MT   ASPIRIN LOW DOSE 81 MG EC tablet Generic drug:  aspirin TAKE ONE TABLET BY MOUTH ONCE DAILY.   cetirizine 10 MG tablet Commonly known as:  ZYRTEC TAKE 1 TABLET BY MOUTH ONCE DAILY FOR ALLERGIES.   DAILY-VITE Tabs TAKE ONE TABLET BY MOUTH ONCE DAILY.   fluticasone 50 MCG/ACT nasal spray Commonly known as:  FLONASE SPRAY 2 SPRAYS INTO EACH NOSTRIL ONCE DAILY.   ipratropium-albuterol 0.5-2.5 (3) MG/3ML Soln Commonly known as:  DUONEB Take 3 mLs by nebulization every 4 (four) hours as needed.   Melatonin 3 MG Subl Place 6 mg under the tongue daily.   methimazole 5 MG tablet Commonly known as:  TAPAZOLE TAKE 1/2 TABLET (2.5MG) BY MOUTH EVERY OTHER DAY.   polyethylene glycol powder powder Commonly known as:  GLYCOLAX/MIRALAX MIX 1 CAPFUL (17G) IN 8 OUNCES OF JUICE/WATER AND DRINK ONCE DAILY ASNEEDED FOR CONSTIPATION.   ranitidine 150 MG tablet Commonly known as:  ZANTAC TAKE (1) TABLET BY MOUTH ONCE DAILY.   sertraline 50 MG tablet Commonly known as:  ZOLOFT TAKE (1/2) TABLET BY MOUTH ONCE DAILY.   torsemide 20 MG tablet Commonly known as:  DEMADEX TAKE 20 MG BY MOUTH EVERY MORNING.   Vitamin D3 2000 units capsule TAKE (1) CAPSULE BY MOUTH ONCE DAILY.      * This list has 2 medication(s) that are  the same as other medications prescribed for you. Read the directions carefully, and ask  your doctor or other care provider to review them with you.

## 2017-11-25 ENCOUNTER — Telehealth: Payer: Self-pay | Admitting: Pulmonary Disease

## 2017-11-25 ENCOUNTER — Other Ambulatory Visit: Payer: Self-pay | Admitting: *Deleted

## 2017-11-25 ENCOUNTER — Other Ambulatory Visit: Payer: Self-pay | Admitting: Pulmonary Disease

## 2017-11-25 MED ORDER — ALPRAZOLAM 1 MG PO TABS: ORAL_TABLET | ORAL | 2 refills | Status: DC

## 2017-11-25 MED ORDER — ALPRAZOLAM 0.25 MG PO TABS: ORAL_TABLET | ORAL | 2 refills | Status: DC

## 2017-11-25 NOTE — Telephone Encounter (Signed)
Called and spoke with pt's daughter, shirley. [name] states pt has been having panic attacks. Pt develops increased sob during attacks.  [name] states EMS was called on pt last night. O2 sats was good. Per [name] EMS stated that pt was breathing thru her mouth.  [name] states pt is still having a mild panic attack this morning.  [name] is requesting recommendations.   SN please advise. Thanks.    Current Outpatient Medications on File Prior to Visit  Medication Sig Dispense Refill  . acetaminophen (TYLENOL) 500 MG tablet Take 1,000 mg by mouth every 4 (four) hours as needed for mild pain.    [name]. ADVAIR DISKUS 100-50 MCG/DOSE AEPB INHALE 1 PUFF BY MOUTH TWICE DAILY. RINSE MOUTH AFTER USE TO PREVENT THRUSH. 60 each 5  . albuterol (PROAIR HFA) 108 (90 Base) MCG/ACT inhaler USE 1 TO 2 PUFFS EVERY SIX HOURS AS NEEDED. (Patient taking differently: Inhale 1-2 puffs into the lungs every 6 (six) hours as needed for wheezing or shortness of breath. USE 1 TO 2 PUFFS EVERY SIX HOURS AS NEEDED.) 8.5 g 0  . albuterol (PROAIR HFA) 108 (90 Base) MCG/ACT inhaler USE  2 PUFFS EVERY SIX HOURS AS NEEDED. 8.5 g 2  . ALPRAZolam (XANAX) 0.25 MG tablet 1/2 tab by mouth twice daily 30 tablet 0  . amLODipine (NORVASC) 5 MG tablet TAKE ONE TABLET BY MOUTH ONCE DAILY. 30 tablet 0  . ASPIRIN LOW DOSE 81 MG EC tablet TAKE ONE TABLET BY MOUTH ONCE DAILY. 30 tablet 0  . Benzocaine (ANBESOL MT) Apply 1 application topically as needed (for tenderness around loose bridge).    . cetirizine (ZYRTEC) 10 MG tablet TAKE 1 TABLET BY MOUTH ONCE DAILY FOR ALLERGIES. 30 tablet 0  . Cholecalciferol (VITAMIN D3) 2000 units capsule TAKE (1) CAPSULE BY MOUTH ONCE DAILY. 30 capsule 0  . fluticasone (FLONASE) 50 MCG/ACT nasal spray SPRAY 2 SPRAYS INTO EACH NOSTRIL ONCE DAILY. 16 g 5  . ipratropium-albuterol (DUONEB) 0.5-2.5 (3) MG/3ML SOLN Take 3 mLs by nebulization every 4 (four) hours as needed. (Patient taking differently: Take 3 mLs by  nebulization every 4 (four) hours as needed (for shortness of breath). ) 360 mL 0  . Melatonin 3 MG SUBL Place 6 mg under the tongue daily. 60 tablet 5  . methimazole (TAPAZOLE) 5 MG tablet TAKE 1/2 TABLET (2.5MG ) BY MOUTH EVERY OTHER DAY. 8 tablet 0  . Multiple Vitamin (DAILY-VITE) TABS TAKE ONE TABLET BY MOUTH ONCE DAILY. 30 tablet 11  . polyethylene glycol powder (GLYCOLAX/MIRALAX) powder MIX 1 CAPFUL (17G) IN 8 OUNCES OF JUICE/WATER AND DRINK ONCE DAILY ASNEEDED FOR CONSTIPATION. 527 g 0  . ranitidine (ZANTAC) 150 MG tablet TAKE (1) TABLET BY MOUTH ONCE DAILY. 30 tablet 0  . sertraline (ZOLOFT) 50 MG tablet TAKE (1/2) TABLET BY MOUTH ONCE DAILY. 15 tablet 0  . Spacer/Aero-Holding Chambers (AEROCHAMBER PLUS WITH MASK) inhaler Use as instructed 1 each 2  . torsemide (DEMADEX) 20 MG tablet TAKE 20 MG BY MOUTH EVERY MORNING. 30 tablet 5   No current facility-administered medications on file prior to visit.     Allergies  Allergen Reactions  . Azithromycin Shortness Of Breath  . Ciprofloxacin Other (See Comments)     hallucinations  . Levofloxacin Other (See Comments)    Insomnia, indigestion, tingling sensation in legs  . Furosemide Rash    Bullous pemphigoid  . Latex Rash  . Other Rash    EKG leads caused a rash that required steroids to  clear

## 2017-11-25 NOTE — Telephone Encounter (Addendum)
lmtcb x2 for pt's daughter, [name].

## 2017-11-25 NOTE — Telephone Encounter (Signed)
Patient's daughter, [name], returning call.  CB is [phone]. She states she was not able to answer last phone call from us around 11:00 am.

## 2017-11-25 NOTE — Telephone Encounter (Signed)
Spoke with pt's daughter, she states she would like for the Rx to say she has to take Xanax everyday versus as needed. She needs us to send an updated order to [facility] when complete. SN ok to send order? Please advise.   [facility]

## 2017-11-25 NOTE — Telephone Encounter (Signed)
Prescription clarified and changed in epic. Rx refaxed to [facility]. [name] notified. Nothing further needed.

## 2017-11-25 NOTE — Telephone Encounter (Signed)
Patient's daughter would like a call back about this as well to make sure how this is written as well.  CB is [phone], [name].

## 2017-11-25 NOTE — Telephone Encounter (Signed)
Dr. Kriste BasqueNadel recommendations:  You are prescribed xanax 0.5 for panic attacks already. You can take 1/2-1 tablet PO TID. Take 1/2 tablet TID for maintenance to prevent the panic attacks. Take 1 tablet when you have a panic attack.   [name] notified of change.

## 2017-11-25 NOTE — Telephone Encounter (Signed)
[name] called back and given recommendations. Xanax rx being faxed to [facility].

## 2017-11-26 NOTE — Telephone Encounter (Signed)
Gwendolyn DikeJennifer from [facility] clarification on the directions [phone]

## 2017-11-26 NOTE — Telephone Encounter (Signed)
Spoke with [name], [name] and her discussed the dosing intervals for the Xanax. Nothing further is needed.

## 2017-11-29 ENCOUNTER — Telehealth: Payer: Self-pay | Admitting: Pulmonary Disease

## 2017-11-29 NOTE — Telephone Encounter (Signed)
Spoke with pt's daughter [name], states since changing xanax from tid prn to tid on Monday pt has been "like a zombie"- increased lethargy, sleeping all day, difficulty walking, irritable when woken up.   Rx was changed on Monday to 1/2 tab TID, take 1 full tab when having an anxiety attack.  Was previously being taken  1/2 tab BID which was not controlling anxiety attacks.  SN please advise on recs.  Thanks.

## 2017-11-29 NOTE — Telephone Encounter (Addendum)
Per SN-no other option; change sig to Xanax 0.5mg  1 po BID everyday and give Xanax 0.5mg  1 tablet every 6 hours prn anxiety attacks. Thanks.

## 2017-11-29 NOTE — Telephone Encounter (Signed)
Spoke with [facility]irley. Gwendolyn ForestShirley has agreed to change the dosing times to 130p and 530.   Nothing else needed at time of call.

## 2017-12-02 ENCOUNTER — Telehealth: Payer: Self-pay | Admitting: Pulmonary Disease

## 2017-12-02 MED ORDER — ALPRAZOLAM 0.5 MG PO TABS: ORAL_TABLET | ORAL | 5 refills | Status: DC

## 2017-12-02 NOTE — Addendum Note (Signed)
Addended by: Wyvonne LenzPINION, Mishika Flippen P on: 12/02/2017 03:30 PM   Modules accepted: Orders

## 2017-12-02 NOTE — Telephone Encounter (Signed)
Called [name] back letting her know that what we had done when I had stated to her 1 tab was we had changed the dose from the original 1mg  to 0.5mg  for her to receive 1 tab bid which would be the same if we had done the 1mg  1/2 tab instead.  [name] expressed her understanding about what I had said regarding that but told me that pt had been receiving the xanax tid instead of bid.  Johna Rolesold [name] that I would take care of changing her mom's Rx to 0.5mg  1 tab po at 1:30 and 1 tab po at 5:30.  Called [facility] to get a fax number so I could send Rx to them at.  Rx faxed. Nothing further needed.

## 2017-12-02 NOTE — Telephone Encounter (Signed)
Please see phone note from 11/29/17 where info from today was added to that note. Encounter from 11/29/17 was reopened

## 2017-12-02 NOTE — Telephone Encounter (Signed)
Called and spoke with pt's daughter who stated that when she had called and talked with Dr. Jodelle GreenNadel's RN, xanex was to be changed to BID instead of TID.  [name] stated that the Xanex times were supposed to have been changed to 1:30 and 5:30 but they still had the Rx at 0.5mg  1 pill TID instead of the change to 0.5mg  1 pill BID.  [name] stated that as of today, pt has slipped out of her recliner and has also fallen in bathroom but did not get hurt.  Read the phone notes from 11/29/17 where Dorethea ClanAshley Caulfield stated pt's daughter said 1/2 tab bid was not controlling pt's anxiety but [name] stated that was not correct. [name] stated that the 1/2 tab that doing the 1/2 tab bid is what they are wanting to try instead of doing 1 tab bid which was originally stated on 11/29/17.  Dr. Kriste BasqueNadel, please advise on if we can do 1/2 tab bid and if the specified times by pt's daughter of 1:30 and 5:30 would be okay for them to give xanax to pt.  Thanks!

## 2017-12-03 ENCOUNTER — Telehealth: Payer: Self-pay | Admitting: Pulmonary Disease

## 2017-12-03 MED ORDER — ALPRAZOLAM 0.5 MG PO TABS: ORAL_TABLET | ORAL | 5 refills | Status: DC

## 2017-12-03 NOTE — Telephone Encounter (Signed)
[facility] [place] rx for xanax 0.5mg  q12prn. Nothing further needed.

## 2017-12-03 NOTE — Telephone Encounter (Signed)
Dr. Kriste BasqueNadel recommendations:  Keep order from 12/02/17. Please place additional order for xanax 0.5mg ...the patient may have an additional dose (in addition to her scheduled doses) of 0.5mg  every 12 hours as needed for panic attacks.

## 2017-12-03 NOTE — Telephone Encounter (Signed)
ALPRAZolam (XANAX) 0.5 MG tablet [[id number]][[id number]]  Order Details  Dose, Route, Frequency: As Directed   Dispense Quantity: 90 tablet Refills: 5 Fills remaining: --        Sig: Take 1tab at 1:30pm and 1tab at 5:30pm to help with pt's anxiety.       Written Date: 12/02/17 Expiration Date: 05/31/18    Start Date: 12/02/17 End Date: --         Ordering Provider:  Michele McalpineNadel, Scott M, MD DEA #:  [id number] NPI:  [id number]      The last Rx stated last week 1/2 of 1mg  tablet 3 times a day if she is stable  1 tablet at dosing interval when she is having a panic attack. Pharmacist just wanted to make sure this Rx was correct since the last Rx was different. Raoul PitchSierra can you please call, I am not sure what she is asking.

## 2017-12-04 ENCOUNTER — Other Ambulatory Visit: Payer: Self-pay | Admitting: Pulmonary Disease

## 2017-12-05 ENCOUNTER — Other Ambulatory Visit: Payer: Self-pay | Admitting: *Deleted

## 2017-12-05 MED ORDER — ALPRAZOLAM 0.5 MG PO TABS: ORAL_TABLET | ORAL | 5 refills | Status: DC

## 2017-12-27 ENCOUNTER — Other Ambulatory Visit: Payer: Self-pay | Admitting: Pulmonary Disease

## 2017-12-27 ENCOUNTER — Telehealth: Payer: Self-pay | Admitting: Pulmonary Disease

## 2017-12-27 MED ORDER — OSELTAMIVIR PHOSPHATE 75 MG PO CAPS: 75.0000 mg | ORAL_CAPSULE | Freq: Two times a day (BID) | ORAL | 0 refills | Status: DC

## 2017-12-27 MED ORDER — OSELTAMIVIR PHOSPHATE 75 MG PO CAPS: 75.0000 mg | ORAL_CAPSULE | Freq: Every day | ORAL | 0 refills | Status: DC

## 2017-12-27 NOTE — Telephone Encounter (Signed)
Spoke with [facility]irley. She is aware of SN's recs. Medication has already been called into her pharmacy. Nothing else needed at time of call.

## 2017-12-27 NOTE — Telephone Encounter (Signed)
Called and spoke to pt's daughter, [name] [facility]) who stated that she does have a stopped up nose and also running but is unsure if there is any discoloration in the mucus.  [name] states that pt is not coughing yet but is wondering if something could be prescribed for pt.  [name] also stated that a lot of people at pt's residence (employees included) are sick and is hoping something could be prescribed for pt before she becomes worse.  Dr. Kriste BasqueNadel, please advise.  Thanks!

## 2017-12-27 NOTE — Telephone Encounter (Signed)
Patient's daughter, [name], returning call.  CB is [phone].

## 2017-12-27 NOTE — Telephone Encounter (Signed)
Spoke with Dr. Kriste BasqueNadel about pt's symptoms and due to all the sicknesses that are going on at pt's place of residence and knowing another resident there had the flu, SN stated to prescribe tamiflu for pt.  Have already sent Rx in to pt's preferred pharmacy.  Called [name] [facility]) and had to leave her a message to call us so we can state this to her.  Will state this info to [place] when she calls back.

## 2018-01-01 ENCOUNTER — Telehealth: Payer: Self-pay | Admitting: Pulmonary Disease

## 2018-01-01 NOTE — Telephone Encounter (Signed)
Order to d/c Tramadol has been faxed to [facility] at provided fax number. Judeth CornfieldStephanie with [facility] is aware. Nothing further is needed.

## 2018-01-01 NOTE — Telephone Encounter (Signed)
SN can we place an order to discontinue Tramadol? She has not used this medication since October 2018. She needs order faxed to her [phone]  Current Outpatient Medications on File Prior to Visit  Medication Sig Dispense Refill  . acetaminophen (TYLENOL) 500 MG tablet Take 1,000 mg by mouth every 4 (four) hours as needed for mild pain.    [name]. ADVAIR DISKUS 100-50 MCG/DOSE AEPB INHALE 1 PUFF BY MOUTH TWICE DAILY. RINSE MOUTH AFTER USE TO PREVENT THRUSH. 60 each 5  . albuterol (PROAIR HFA) 108 (90 Base) MCG/ACT inhaler USE 1 TO 2 PUFFS EVERY SIX HOURS AS NEEDED. (Patient taking differently: Inhale 1-2 puffs into the lungs every 6 (six) hours as needed for wheezing or shortness of breath. USE 1 TO 2 PUFFS EVERY SIX HOURS AS NEEDED.) 8.5 g 0  . albuterol (PROAIR HFA) 108 (90 Base) MCG/ACT inhaler USE  2 PUFFS EVERY SIX HOURS AS NEEDED. 8.5 g 2  . ALPRAZolam (XANAX) 0.5 MG tablet Take 1tab at 1:30pm and 1tab at 5:30pm to help with pt's anxiety. 90 tablet 5  . ALPRAZolam (XANAX) 0.5 MG tablet Take every 12 hours as needed in ADDITION to her scheduled doses. This order is as needed for panic attacks. 90 tablet 5  . amLODipine (NORVASC) 5 MG tablet TAKE ONE TABLET BY MOUTH ONCE DAILY. 30 tablet 0  . ASPIRIN LOW DOSE 81 MG EC tablet TAKE ONE TABLET BY MOUTH ONCE DAILY. 30 tablet 0  . Benzocaine (ANBESOL MT) Apply 1 application topically as needed (for tenderness around loose bridge).    . cetirizine (ZYRTEC) 10 MG tablet TAKE 1 TABLET BY MOUTH ONCE DAILY FOR ALLERGIES. 30 tablet 0  . Cholecalciferol (VITAMIN D3) 2000 units capsule TAKE (1) CAPSULE BY MOUTH ONCE DAILY. 30 capsule 0  . fluticasone (FLONASE) 50 MCG/ACT nasal spray SPRAY 2 SPRAYS INTO EACH NOSTRIL ONCE DAILY. 16 g 5  . ipratropium-albuterol (DUONEB) 0.5-2.5 (3) MG/3ML SOLN Take 3 mLs by nebulization every 4 (four) hours as needed. (Patient taking differently: Take 3 mLs by nebulization every 4 (four) hours as needed (for shortness of breath). )  360 mL 0  . Melatonin 3 MG SUBL Place 6 mg under the tongue daily. 60 tablet 5  . methimazole (TAPAZOLE) 5 MG tablet TAKE 1/2 TABLET (2.5MG ) BY MOUTH EVERY OTHER DAY. 8 tablet 0  . Multiple Vitamin (DAILY-VITE) TABS TAKE ONE TABLET BY MOUTH ONCE DAILY. 30 tablet 11  . oseltamivir (TAMIFLU) 75 MG capsule Take 1 capsule (75 mg total) by mouth daily. 10 capsule 0  . oseltamivir (TAMIFLU) 75 MG capsule Take 1 capsule (75 mg total) by mouth 2 (two) times daily. 10 capsule 0  . polyethylene glycol powder (GLYCOLAX/MIRALAX) powder MIX 1 CAPFUL (17G) IN 8 OUNCES OF JUICE/WATER AND DRINK ONCE DAILY ASNEEDED FOR CONSTIPATION. 527 g 0  . ranitidine (ZANTAC) 150 MG tablet TAKE (1) TABLET BY MOUTH ONCE DAILY. 30 tablet 0  . sertraline (ZOLOFT) 50 MG tablet TAKE (1/2) TABLET BY MOUTH ONCE DAILY. 15 tablet 0  . Spacer/Aero-Holding Chambers (AEROCHAMBER PLUS WITH MASK) inhaler Use as instructed 1 each 2  . torsemide (DEMADEX) 20 MG tablet TAKE 20 MG BY MOUTH EVERY MORNING. 30 tablet 5   No current facility-administered medications on file prior to visit.    Allergies  Allergen Reactions  . Azithromycin Shortness Of Breath  . Ciprofloxacin Other (See Comments)     hallucinations  . Levofloxacin Other (See Comments)    Insomnia, indigestion, tingling sensation in  legs  . Furosemide Rash    Bullous pemphigoid  . Latex Rash  . Other Rash    EKG leads caused a rash that required steroids to clear   [phone] Fax number

## 2018-01-01 NOTE — Telephone Encounter (Signed)
Per SN ok to D/C tramadol

## 2018-01-02 ENCOUNTER — Other Ambulatory Visit: Payer: Self-pay | Admitting: Pulmonary Disease

## 2018-01-10 ENCOUNTER — Telehealth: Payer: Self-pay | Admitting: Pulmonary Disease

## 2018-01-10 NOTE — Telephone Encounter (Signed)
Spoke with [name], she states she has been talking out of the ordinary. She is not complaining but she is going to the bathroom more frequently. She does not have fever but states she didn't feel good this morning. DN can we send an order for a urinalysis to [facility]? Please advise.    Per SN. Please order Urinalysis and Culture, call the results stat.  Faxed order to [facility] and advised [name]. I will leave encounter open until they call us with the results.   Current Outpatient Medications on File Prior to Visit  Medication Sig Dispense Refill  . acetaminophen (TYLENOL) 500 MG tablet Take 1,000 mg by mouth every 4 (four) hours as needed for mild pain.    [name] ADVAIR DISKUS 100-50 MCG/DOSE AEPB INHALE 1 PUFF BY MOUTH TWICE DAILY. RINSE MOUTH AFTER USE TO PREVENT THRUSH. 60 each 5  . albuterol (PROAIR HFA) 108 (90 Base) MCG/ACT inhaler USE 1 TO 2 PUFFS EVERY SIX HOURS AS NEEDED. (Patient taking differently: Inhale 1-2 puffs into the lungs every 6 (six) hours as needed for wheezing or shortness of breath. USE 1 TO 2 PUFFS EVERY SIX HOURS AS NEEDED.) 8.5 g 0  . albuterol (PROAIR HFA) 108 (90 Base) MCG/ACT inhaler USE  2 PUFFS EVERY SIX HOURS AS NEEDED. 8.5 g 2  . ALPRAZolam (XANAX) 0.5 MG tablet Take 1tab at 1:30pm and 1tab at 5:30pm to help with pt's anxiety. 90 tablet 5  . ALPRAZolam (XANAX) 0.5 MG tablet Take every 12 hours as needed in ADDITION to her scheduled doses. This order is as needed for panic attacks. 90 tablet 5  . amLODipine (NORVASC) 5 MG tablet TAKE ONE TABLET BY MOUTH ONCE DAILY. 30 tablet 0  . ASPIRIN LOW DOSE 81 MG EC tablet TAKE ONE TABLET BY MOUTH ONCE DAILY. 30 tablet 0  . Benzocaine (ANBESOL MT) Apply 1 application topically as needed (for tenderness around loose bridge).    . cetirizine (ZYRTEC) 10 MG tablet TAKE 1 TABLET BY MOUTH ONCE DAILY FOR ALLERGIES. 30 tablet 0  . Cholecalciferol (VITAMIN D3) 2000 units capsule TAKE (1) CAPSULE BY MOUTH ONCE DAILY. 30 capsule 0   . fluticasone (FLONASE) 50 MCG/ACT nasal spray SPRAY 2 SPRAYS INTO EACH NOSTRIL ONCE DAILY. 16 g 5  . ipratropium-albuterol (DUONEB) 0.5-2.5 (3) MG/3ML SOLN Take 3 mLs by nebulization every 4 (four) hours as needed. (Patient taking differently: Take 3 mLs by nebulization every 4 (four) hours as needed (for shortness of breath). ) 360 mL 0  . Melatonin 3 MG SUBL Place 6 mg under the tongue daily. 60 tablet 5  . methimazole (TAPAZOLE) 5 MG tablet TAKE 1/2 TABLET (2.5MG ) BY MOUTH EVERY OTHER DAY. 8 tablet 0  . Multiple Vitamin (DAILY-VITE) TABS TAKE ONE TABLET BY MOUTH ONCE DAILY. 30 tablet 11  . oseltamivir (TAMIFLU) 75 MG capsule Take 1 capsule (75 mg total) by mouth daily. 10 capsule 0  . oseltamivir (TAMIFLU) 75 MG capsule Take 1 capsule (75 mg total) by mouth 2 (two) times daily. 10 capsule 0  . polyethylene glycol powder (GLYCOLAX/MIRALAX) powder MIX 1 CAPFUL (17G) IN 8 OUNCES OF JUICE/WATER AND DRINK ONCE DAILY ASNEEDED FOR CONSTIPATION. 527 g 0  . ranitidine (ZANTAC) 150 MG tablet TAKE (1) TABLET BY MOUTH ONCE DAILY. 30 tablet 0  . sertraline (ZOLOFT) 50 MG tablet TAKE (1/2) TABLET BY MOUTH ONCE DAILY. 15 tablet 0  . Spacer/Aero-Holding Chambers (AEROCHAMBER PLUS WITH MASK) inhaler Use as instructed 1 each 2  .  torsemide (DEMADEX) 20 MG tablet TAKE 20 MG BY MOUTH EVERY MORNING. 30 tablet 5   No current facility-administered medications on file prior to visit.    Allergies  Allergen Reactions  . Azithromycin Shortness Of Breath  . Ciprofloxacin Other (See Comments)     hallucinations  . Levofloxacin Other (See Comments)    Insomnia, indigestion, tingling sensation in legs  . Furosemide Rash    Bullous pemphigoid  . Latex Rash  . Other Rash    EKG leads caused a rash that required steroids to clear

## 2018-01-14 NOTE — Telephone Encounter (Signed)
Left message for [name] to call back to follow up

## 2018-01-15 MED ORDER — SULFAMETHOXAZOLE-TRIMETHOPRIM 800-160 MG PO TABS: 1.0000 | ORAL_TABLET | Freq: Two times a day (BID) | ORAL | 0 refills | Status: DC
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
## 2018-01-15 NOTE — Telephone Encounter (Signed)
Per SN:  Bactrim DS #20 1 tablet BID until gone with extra water  Change xanax Rx to .25mg  tablet at 1:30pm and .5mg  tablet at 5:30pm.   Patient's daughter has been made aware per SN.   Rx has been sent to [facility] for both above.

## 2018-01-15 NOTE — Telephone Encounter (Signed)
Spoke with Lupita LeashDonna over at [facility] regarding UA and culture, stated that she faxed the results over this am. Fax located and given to SN.

## 2018-01-15 NOTE — Telephone Encounter (Signed)
Pt daughter, [name], returning call from. Cb is [phone].

## 2018-01-15 NOTE — Telephone Encounter (Signed)
I called daugh Gwendolyn RileShirley [name] [phone] 1) [facility] sent over Urine C&S report today from culture done 01/10/18> Urine pos for >100K enterobacter sens to cipro/ levaquin/ sulfa; pt is intol to cipro/ levaquin, therefore we will call in BACTERIM-DS #20 one po Bid til gone... 2) pt still getting confused, agitated then over sedated and Gwendolyn ForestShirley suggests change in Alpraz 0.5mg  tabs to 1/2 tab at 1:30PM, and 1 tab at 5:30PM...[name]

## 2018-01-16 ENCOUNTER — Other Ambulatory Visit: Payer: Self-pay | Admitting: Pulmonary Disease

## 2018-01-16 ENCOUNTER — Telehealth: Payer: Self-pay | Admitting: Pulmonary Disease

## 2018-01-16 NOTE — Telephone Encounter (Signed)
Change xanax Rx to .25mg  tablet at 1:30pm and .5mg  tablet at 5:30pm.    Called and spoke with Victorino DikeJennifer at [facility] and she is aware of the change in the xanax medication.

## 2018-01-16 NOTE — Telephone Encounter (Signed)
Patient is requesting refill of Tapazole 5mg  every other day. I noticed that previously only 8 tablets were ordered so I am unsure if this is supposed to be continuous/long term therapy or if it was only for those 8 tablets.  Dr. Kriste BasqueNadel please advise.  Thank you!  Routing to Dr. Kriste BasqueNadel and Misty StanleyLisa.

## 2018-01-16 NOTE — Telephone Encounter (Signed)
Per SN He called [facility] in [place]. They have been filling Tapazole 5mg  with directions take 1/2 tab every other day since 08/13/17 ( #8 tabs is a 1 month supply). She had thyroid check 10/17/17 by Dr Elvera LennoxGherghe & was euthyroid. Therefore we will continue dose going forward & recheck labs next ROV. Nothing further needed.

## 2018-01-16 NOTE — Telephone Encounter (Signed)
I called [facility] in [place] > They have been filling Tapazole (Methimazole) 5mg  tabs with directions to take 1/2 tab every other day since 08/13/17 (#8 tabs is a one month supply)... She had thyroid labs checked 10/17/17 by DrGherghe & was euthyroid- therefore we will continue this dose going forward & recheck labs next ROV...  SMN

## 2018-01-16 NOTE — Telephone Encounter (Signed)
See previous message

## 2018-01-17 ENCOUNTER — Other Ambulatory Visit: Payer: Self-pay | Admitting: Pulmonary Disease

## 2018-01-23 ENCOUNTER — Other Ambulatory Visit: Payer: Self-pay | Admitting: Pulmonary Disease

## 2018-02-04 ENCOUNTER — Telehealth: Payer: Self-pay | Admitting: Pulmonary Disease
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
## 2018-02-04 NOTE — Telephone Encounter (Signed)
Per SN- This is probable manifestation of her dementia.  He is ok with neuro consult with [facility] if daughter would like.  She has extra xanax 0.5mg  as needed for panic attacks order and questioned if she was getting that.  She has Zoloft 50mg , take 1/2 tab by mouth daily. SN feels that this dose is to low to cause problems. If her daughter feels like that is the problem, we can d/c the Zoloft or up the dose to 50mg  daily. We can try seroquel 50mg  as needed for agitation, if she feels that may help.  I called and spoke with her daughter [name].  We went over what SN suggested.  She had just left her Mother at [facility], and she asked them to hold her Xanax, because she feels that the Xanax is the problem.  She stated that she will call back in a few days to give an update on [name] condition and if holding the Xanax helped.  She does not want to start any other medications and doesn't feel that her Mother needs a neuro consult. I will follow up later this week.

## 2018-02-06 ENCOUNTER — Encounter: Payer: Self-pay | Admitting: Neurology

## 2018-02-06 ENCOUNTER — Other Ambulatory Visit: Payer: Self-pay | Admitting: Pulmonary Disease

## 2018-02-06 DIAGNOSIS — F0391 Unspecified dementia with behavioral disturbance: Secondary | ICD-10-CM
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
## 2018-02-06 NOTE — Telephone Encounter (Signed)
Per SN- [facility] off the [facility]anax [facility]. Patient needs neuro consult, ASAP for dementia with behavioral disturbance. Spoke to her Daughter [name] about SN recommendations.  [name] stated understanding, and is fine with her staying off the Xanax unless needed.  Neuro consult order placed.  [name] stated that she would be keep SN updated on changes. I plan on checking in with [name] next week. Nothing further needed at this time.

## 2018-02-06 NOTE — Telephone Encounter (Signed)
lmtcb x1 for [name] for update

## 2018-02-06 NOTE — Telephone Encounter (Signed)
Geralynn RileShirley Broome returning call - she can be reached at [phone]

## 2018-02-14 ENCOUNTER — Telehealth: Payer: Self-pay | Admitting: Pulmonary Disease

## 2018-02-14 NOTE — Telephone Encounter (Signed)
Requesting this be sent over today if possible.  Patient's daughter states she is doing a lot better.

## 2018-02-14 NOTE — Telephone Encounter (Signed)
Left message for [name] to call back.  

## 2018-02-17 NOTE — Telephone Encounter (Signed)
Called and got the fax number for [facility]. Faxed over Dr. Jodelle GreenNadel's recommendations so the staff are aware. Spoke with [name] who will give the faxed information to [name] the Resident Care Director and the RN for the day.  Called and spoke with [name], pt's daughter. [name] stated that patient has been doing much better without the Xanax and is actually sleeping better at night. Patient has a neurologist appointment scheduled for July. Patient's daughter also reported that patient at times cries but that could be related to her dementia. [name] stated that family has been staying with patient at night and that patient is doing much better and she thanks Dr. Kriste BasqueNadel for that.  Nothing further needed.

## 2018-02-21 ENCOUNTER — Other Ambulatory Visit (INDEPENDENT_AMBULATORY_CARE_PROVIDER_SITE_OTHER): Payer: Medicare Other

## 2018-02-21 ENCOUNTER — Other Ambulatory Visit: Payer: Self-pay | Admitting: Internal Medicine

## 2018-02-21 ENCOUNTER — Telehealth: Payer: Self-pay | Admitting: Pulmonary Disease

## 2018-02-21 DIAGNOSIS — N949 Unspecified condition associated with female genital organs and menstrual cycle: Secondary | ICD-10-CM | POA: Diagnosis not present

## 2018-02-21 DIAGNOSIS — N898 Other specified noninflammatory disorders of vagina: Secondary | ICD-10-CM

## 2018-02-21 LAB — URINALYSIS, ROUTINE W REFLEX MICROSCOPIC
BILIRUBIN URINE: NEGATIVE
Ketones, ur: NEGATIVE
Nitrite: NEGATIVE
PH: 5.5 (ref 5.0–8.0)
Specific Gravity, Urine: 1.02 (ref 1.000–1.030)
TOTAL PROTEIN, URINE-UPE24: NEGATIVE
URINE GLUCOSE: NEGATIVE
UROBILINOGEN UA: 0.2 (ref 0.0–1.0)

## 2018-02-21 MED ORDER — AMOXICILLIN 250 MG PO CAPS: 250.0000 mg | ORAL_CAPSULE | Freq: Three times a day (TID) | ORAL | 0 refills | Status: DC

## 2018-02-21 NOTE — Telephone Encounter (Signed)
Called and spoke to patient's daughter, [name], who stayed the night with patient. She stated that pt did not sleep all night and was complaining of vaginal burning and itching. [name] also stated that patient has been urinating in her underwear. She is concerned patient has a yeast infection or UTI. [name] said if a sample is needed she can come pick up a cup and would try to get a sample.   Patient sees SN as her PCP.  Dr. Sherene SiresWert please advise.

## 2018-02-21 NOTE — Telephone Encounter (Signed)
Spoke with pt's daughter, [name]. She is aware of DrTalbert Forest. Thurston HoleWert's recommendation. Order has been placed as STAT. [name] will come by and pick up a cup and bring the specimen as quickly as she can. Nothing further was needed at this time.

## 2018-02-21 NOTE — Progress Notes (Signed)
Spoke with the pt's daughter and notified of recs per MW and she verbalized understanding  [facility]

## 2018-02-21 NOTE — Telephone Encounter (Signed)
Yes u/a first as if this is some other problem the abx may make it worse

## 2018-03-04 ENCOUNTER — Telehealth: Payer: Self-pay | Admitting: Pulmonary Disease

## 2018-03-04 NOTE — Telephone Encounter (Signed)
Called and spoke with Lupita LeashDonna, she states that per the daughters request they are discontinuing the xanax daily and only doing as needed. I see the note from 4.1.19 where this was taken care of but Lupita LeashDonna states that they have to have an order for this not a office note.   SN please advise, thanks.

## 2018-03-04 NOTE — Telephone Encounter (Signed)
Per SN- ok to fax signed order to [facility] to discontinue Xanax scheduled dose and to only have a Xanax 0.5mg  PO by mouth as needed for panic attacks. Called and spoke with Lupita LeashDonna at [facility] about orders and to verify fax number.  Order faxed to [facility] at [phone] Attn. Lupita LeashDonna. Fax verification received.  Nothing further at this time.

## 2018-03-06 ENCOUNTER — Telehealth: Payer: Self-pay | Admitting: Neurology

## 2018-03-06 NOTE — Telephone Encounter (Signed)
VM message was left saying [name] was returning a call

## 2018-03-06 NOTE — Telephone Encounter (Signed)
Called and left message for DarbyShirley. Advsd her no one from our office has tried to call her, I believe she may be trying to reach pulmonolgy.

## 2018-03-10 ENCOUNTER — Ambulatory Visit (INDEPENDENT_AMBULATORY_CARE_PROVIDER_SITE_OTHER): Payer: Medicare Other | Admitting: Neurology

## 2018-03-10 ENCOUNTER — Encounter: Payer: Self-pay | Admitting: Neurology

## 2018-03-10 ENCOUNTER — Other Ambulatory Visit: Payer: Medicare Other

## 2018-03-10 VITALS — BP 132/88 | HR 100 | Ht 66.0 in | Wt 170.0 lb

## 2018-03-10 DIAGNOSIS — G609 Hereditary and idiopathic neuropathy, unspecified: Secondary | ICD-10-CM | POA: Diagnosis not present

## 2018-03-10 DIAGNOSIS — F0391 Unspecified dementia with behavioral disturbance: Secondary | ICD-10-CM

## 2018-03-10 DIAGNOSIS — F419 Anxiety disorder, unspecified: Secondary | ICD-10-CM | POA: Diagnosis not present

## 2018-03-10 MED ORDER — GABAPENTIN 100 MG PO CAPS: 100.0000 mg | ORAL_CAPSULE | Freq: Every day | ORAL | 5 refills | Status: DC
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
## 2018-03-10 NOTE — Patient Instructions (Addendum)
1.  For nerve pain and to help with falling asleep, we will start gabapentin 100mg  at bedtime.   2.  Continue sertraline 25mg  daily for now. 3.  We will check a vitamin B12 level. Your provider has requested that you have labwork completed today. Please [facility] to [facility] ([id number]) on the second floor of this building before leaving the office today. You do not need to check in. If you are not called within 15 minutes please check with the front desk.  4.  Follow up in 6 months.

## 2018-03-11 LAB — TIQ-NTM

## 2018-03-11 LAB — VITAMIN B12: VITAMIN B 12: 725 pg/mL (ref 200–1100)

## 2018-03-14 ENCOUNTER — Telehealth: Payer: Self-pay

## 2018-03-14 ENCOUNTER — Encounter: Payer: Self-pay | Admitting: Neurology

## 2018-03-14 NOTE — Telephone Encounter (Signed)
Attempted to reach Pt, no answer, no VM 

## 2018-03-14 NOTE — Telephone Encounter (Signed)
-----   Message from Drema DallasAdam R Jaffe, DO sent at 03/11/2018  8:00 AM EDT ----- B12 level is normal.

## 2018-03-18 ENCOUNTER — Telehealth: Payer: Self-pay | Admitting: Pulmonary Disease

## 2018-03-18 NOTE — Telephone Encounter (Signed)
Called and spoke with Billingsley from [facility]. She states that patient is "acting out" stating she wants to go to the hospital and that there are things wrong with her. Judeth Cornfield is a Scientist, clinical (histocompatibility and immunogenetics) and said she doesn't know much about this patient.   I asked to speak to a nurse. Spoke with [employer]. She states that patient is very lethargic and is having trouble using the restroom as well as confused. Per SN ok to send in the order. Order faxed. Nothing further needed.

## 2018-03-20 ENCOUNTER — Telehealth: Payer: Self-pay | Admitting: Pulmonary Disease

## 2018-03-20 NOTE — Telephone Encounter (Signed)
Received fax from [facility] about recent UA.  Urine culture was not yet available.   Per SN- Few WBC's We need urine culture results. Have [facility] call when urine culture results resulted. Called Oneita Hurt, Maryellen Pile, nurse of the day for Ms Banwart, gave her SN orders. She stated someone would call as soon as culture is resulted.

## 2018-03-24 NOTE — Telephone Encounter (Addendum)
UA results received by fax from [facility].  Per SN - UA showed no pathogens. I called and spoke with Maryellen Pile, she was her nurse over this past weekend.  She stated that the Patient was back to her baseline. Katlin stated that they did not need anything at this time. I informed her that I would let SN know.

## 2018-03-27 ENCOUNTER — Telehealth: Payer: Self-pay | Admitting: Neurology

## 2018-03-27 NOTE — Telephone Encounter (Signed)
Pt's daughter called and said the gabapentin is not working, not sleeping at night, feet burning and would like a call back regarding this

## 2018-03-27 NOTE — Telephone Encounter (Signed)
Called and spoke with [employer]. We discussed not allowing Pt to sleep for extended periods during the day like she has been doing.  The Xanax was stopped 2 weeks ago, Pt only takes for severe anxiety attacks now, and the home has not contacted her for permission to give that medication since it was stopped.  Pt has been calling her in the overnight hours complaining of her feet burning and hurting and inability to sleep. She wants to know if increasing the gabapentin may help.

## 2018-03-27 NOTE — Telephone Encounter (Signed)
Increase gabapentin to  at bedtime.  She may increase dose to  at bedtime in one week if needed.

## 2018-03-27 NOTE — Telephone Encounter (Signed)
[name], LMOVM advising of gabapentin increase

## 2018-03-28 ENCOUNTER — Telehealth: Payer: Self-pay | Admitting: Neurology

## 2018-03-28 ENCOUNTER — Other Ambulatory Visit: Payer: Self-pay

## 2018-03-28 MED ORDER — GABAPENTIN 100 MG PO CAPS: 200.0000 mg | ORAL_CAPSULE | Freq: Every day | ORAL | 3 refills | Status: DC

## 2018-03-28 NOTE — Telephone Encounter (Signed)
Patient's daughter lmom regarding her Gabapentin medication and the information being faxed to [facility]. Please Call. Thanks

## 2018-03-28 NOTE — Telephone Encounter (Signed)
Called [facility], spoke with [place]. Fax Rx [phone]

## 2018-03-28 NOTE — Telephone Encounter (Signed)
[name] pt's daughter left a VM message saying that pt's prescription for Gabapentin needs to be faxed to [facility], did not have the fax number

## 2018-04-02 DIAGNOSIS — Z79899 Other long term (current) drug therapy: Secondary | ICD-10-CM
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
## 2018-04-03 NOTE — Progress Notes (Signed)
[name] [phone], LMOVM for her to return my call.  Per Dr. Everlena Cooper: We can start Depakote ER  at bedtime. Check CBC, TSH, free T3, free T4 and CMP now and again in 1 month. He recommends looking into a memory care unit for the patient.

## 2018-04-04 ENCOUNTER — Telehealth: Payer: Self-pay | Admitting: Neurology

## 2018-04-04 NOTE — Addendum Note (Signed)
Addended by: Dorthy Cooler on: 04/04/2018 04:10 PM   Modules accepted: Orders

## 2018-04-04 NOTE — Telephone Encounter (Signed)
Called and spoke with [name], she wanted to confirm when to increase to . I advised her was to increase if needed for sleep. The home the Pt is in will not allow that. Advise her to keep at , if needs to be increased, we send in new Rx.

## 2018-04-04 NOTE — Telephone Encounter (Signed)
Victorino Dike with [facility] left a VM message regarding pt's prescription for gabapentin and has a dosage question CB# [phone]

## 2018-04-10 ENCOUNTER — Other Ambulatory Visit: Payer: Medicare Other

## 2018-04-10 DIAGNOSIS — Z79899 Other long term (current) drug therapy: Secondary | ICD-10-CM

## 2018-04-10 LAB — CBC
HCT: 39.9 % (ref 35.0–45.0)
Hemoglobin: 13.3 g/dL (ref 11.7–15.5)
MCH: 29.4 pg (ref 27.0–33.0)
MCHC: 33.3 g/dL (ref 32.0–36.0)
MCV: 88.1 fL (ref 80.0–100.0)
MPV: 10.7 fL (ref 7.5–12.5)
Platelets: 195 10*3/uL (ref 140–400)
RBC: 4.53 10*6/uL (ref 3.80–5.10)
RDW: 13 % (ref 11.0–15.0)
WBC: 4.4 10*3/uL (ref 3.8–10.8)

## 2018-04-10 LAB — COMPREHENSIVE METABOLIC PANEL
AG Ratio: 1.5 (calc) (ref 1.0–2.5)
ALKALINE PHOSPHATASE (APISO): 99 U/L (ref 33–130)
ALT: 11 U/L (ref 6–29)
AST: 17 U/L (ref 10–35)
Albumin: 4 g/dL (ref 3.6–5.1)
BUN/Creatinine Ratio: 13 (calc) (ref 6–22)
BUN: 15 mg/dL (ref 7–25)
CO2: 31 mmol/L (ref 20–32)
CREATININE: 1.14 mg/dL — AB (ref 0.60–0.88)
Calcium: 9.5 mg/dL (ref 8.6–10.4)
Chloride: 98 mmol/L (ref 98–110)
Globulin: 2.6 g/dL (calc) (ref 1.9–3.7)
Glucose, Bld: 76 mg/dL (ref 65–99)
Potassium: 4.2 mmol/L (ref 3.5–5.3)
Sodium: 140 mmol/L (ref 135–146)
Total Bilirubin: 0.4 mg/dL (ref 0.2–1.2)
Total Protein: 6.6 g/dL (ref 6.1–8.1)

## 2018-04-11 LAB — TSH+T4F+T3FREE
Free T4: 1.12 ng/dL (ref 0.82–1.77)
T3 FREE: 3 pg/mL (ref 2.0–4.4)
TSH: 4.86 u[IU]/mL — ABNORMAL HIGH (ref 0.450–4.500)

## 2018-04-18 ENCOUNTER — Telehealth: Payer: Self-pay | Admitting: Internal Medicine

## 2018-04-18 ENCOUNTER — Telehealth: Payer: Self-pay

## 2018-04-18 DIAGNOSIS — E0590 Thyrotoxicosis, unspecified without thyrotoxic crisis or storm: Secondary | ICD-10-CM

## 2018-04-18 NOTE — Telephone Encounter (Signed)
Labs in [place], please review for pt

## 2018-04-18 NOTE — Telephone Encounter (Signed)
Patient just had blood work done for Dr Everlena Cooper on the 23rd they would like to know if they can have this blood work faxed over to our office. If not daughter stated that she could bring her in for blood work just to let her know.   Please advise

## 2018-04-18 NOTE — Telephone Encounter (Signed)
Called and spoke with [name], advsd her of labs

## 2018-04-18 NOTE — Telephone Encounter (Signed)
Labs ordered. Spoke with Agustin Creearlene and went over labs which per DPR in chart is allowed by patient. Faxed order to [facility] to DC MMI

## 2018-04-18 NOTE — Telephone Encounter (Signed)
-----   Message from Drema Dallas, DO sent at 04/11/2018  9:02 AM EDT ----- Routine labs look okay.  The TSH is borderline elevated, which is explained by her thyroid disorder, however the the other thyroid hormones are normal

## 2018-04-18 NOTE — Telephone Encounter (Signed)
I reviewed the labs >> only slightly abnormal >> we need to stop MMI and have her back for a recheck of TFTS: TSH, FT4, FT3 in 1.5 months >> can you please order these?

## 2018-04-21 ENCOUNTER — Other Ambulatory Visit: Payer: Medicare Other

## 2018-04-28 ENCOUNTER — Telehealth: Payer: Self-pay | Admitting: Neurology

## 2018-04-28 NOTE — Telephone Encounter (Signed)
Patient's daughter called and lmom needing to see if patient's Gabapentin can be increased again maybe to 300 MG due to the burning and coldness of her feet. Please Call. Thanks

## 2018-04-28 NOTE — Telephone Encounter (Signed)
Called and spoke with [name]. Per Dr Everlena CooperJaffe last month when gabapentin was increased to 200mg , he did say could go up to 300mg  as needed. Kurtis Bushmandvised [name] I had already called [facility] and spoke with Victorino DikeJennifer to increase the gabapentin to 300 mg QHS

## 2018-05-05 ENCOUNTER — Ambulatory Visit: Payer: Medicare Other | Admitting: Pulmonary Disease

## 2018-05-12 ENCOUNTER — Encounter: Payer: Self-pay | Admitting: Pulmonary Disease

## 2018-05-12 ENCOUNTER — Ambulatory Visit (INDEPENDENT_AMBULATORY_CARE_PROVIDER_SITE_OTHER): Payer: Medicare Other | Admitting: Pulmonary Disease

## 2018-05-12 VITALS — BP 134/62 | HR 81 | Temp 98.2°F | Ht 66.0 in | Wt 172.2 lb

## 2018-05-12 DIAGNOSIS — F0391 Unspecified dementia with behavioral disturbance: Secondary | ICD-10-CM

## 2018-05-12 DIAGNOSIS — F03918 Unspecified dementia, unspecified severity, with other behavioral disturbance: Secondary | ICD-10-CM | POA: Insufficient documentation

## 2018-05-12 DIAGNOSIS — M545 Low back pain: Secondary | ICD-10-CM

## 2018-05-12 DIAGNOSIS — I10 Essential (primary) hypertension: Secondary | ICD-10-CM | POA: Diagnosis not present

## 2018-05-12 DIAGNOSIS — G8929 Other chronic pain: Secondary | ICD-10-CM

## 2018-05-12 DIAGNOSIS — E0590 Thyrotoxicosis, unspecified without thyrotoxic crisis or storm: Secondary | ICD-10-CM

## 2018-05-12 DIAGNOSIS — F419 Anxiety disorder, unspecified: Secondary | ICD-10-CM

## 2018-05-12 DIAGNOSIS — I872 Venous insufficiency (chronic) (peripheral): Secondary | ICD-10-CM

## 2018-05-12 DIAGNOSIS — R609 Edema, unspecified: Secondary | ICD-10-CM

## 2018-05-12 DIAGNOSIS — I4510 Unspecified right bundle-branch block: Secondary | ICD-10-CM | POA: Diagnosis not present

## 2018-05-12 DIAGNOSIS — M159 Polyosteoarthritis, unspecified: Secondary | ICD-10-CM

## 2018-05-12 DIAGNOSIS — G609 Hereditary and idiopathic neuropathy, unspecified: Secondary | ICD-10-CM

## 2018-05-12 DIAGNOSIS — N183 Chronic kidney disease, stage 3 unspecified: Secondary | ICD-10-CM

## 2018-05-12 DIAGNOSIS — M150 Primary generalized (osteo)arthritis: Secondary | ICD-10-CM

## 2018-05-12 NOTE — Progress Notes (Signed)
Subjective:    Patient ID: CITLALLY CAPTAIN, female    DOB: August 30, 1923, 84 y.o.   MRN: [id number]  HPI 84 y/o WF here for a follow up visit... she has multiple medical problems as noted below...  Followed for general medical purposes w/ hx chr obstructive asthma, severe episodic dyspnea from anxiety, HBP, RBBB, Hypercholesterolemia, borderline DM, DJD, LBP w/ sp stenosis, etc... ~  SEE PREV EPIC NOTES FOR THE OLDER DATA >>     CTAngio Chest 05/2009 showed no evid of PE, biapical pleuroparenchymal scarring otherw clear lungs, no adenopathy/ effusions/ etc...  CXR 3/15 showed norm heart size, clear lungs, elev of right hemidiaph, NAD...  LABS 3/15:  Chems- wnl;  CBC- wnl;  BNP=26...  LABS 5/15:  Chems- wnl x BS=120;  CBC- wnl;  BNP= 225    CXR 1/16 showed norm heart size, clear lungs, mild right diaph eventration- no change, Tspine DJD. DISH/ osteopenia; NAD...   2DEcho 1/16 showed norm LV size & function w/ EF=65-70%, AoV leaflets mildly thickened w/o AS, MV leaflets mod thickened w/ trivMR, mild RA dil, PAsys=656mHg...  LABS 1/16:  Chems- wnl w/ Cr=0.95;  BNP=66;  CBC- wnl w/ Hg=14.4..Marland KitchenMarland Kitchen CXR 4/16 in ER> norm heart size, atherosclerosis of Ao, no edema or consolidation in lungs, eventration of right hemidiaph- no change, DJD spine.  EKG 4/16 in ER> NSR, rate72, PACs, LAD, RBBB  LABS 4/16 in ER> Chems- wnl x K=3.0;  CBC- wnl;  Troponin=neg;  BNP=69  CXR 7/24 & 07/15/15 showed norm heart size, tortuous Ao, clear lungs w/ mild elev of right hemidiaph, NAD..Marland KitchenMarland Kitchen EKG 07/15/15 showed chronic changes- NSR/ rate88/ RBBB/ LAD/ old infer scar/ no acute abnormalities...  LABS 7-06/2015> Chems- wnl x BS=127-147;  BNP=55;  Troponins=neg;  CBC- wnl ~  07/21/15> I had another long talk w/ MJoycelyn Schmid& her daughter SEnid Derry I have again rec that she use a low dose of the ALPRAZOLAM 0.5756mtabs- 1/2 tab Tid regularly at breakfast, lunch, & dinner; she may also take an extra 1/2 tab prn anytime she feels  anxious or panic setting in;  We have suggested similar plan many times in the past & for whatever reason she will not do it!  I have offered Psyche referral or to set her up w/ a counselor but this too is declined;  [name] having a very hard time w/ her mother's attitude, lack of cooperation, mild dementia at age [age]etc...  ~  12/01/15>  Seen by Endocrine/ DrGherghe for suppressed TSH- exam of neck was neg, TFT labs proved thyrotoxicosis, TSI was neg as was a Sed rate (14); they chose to try low dose Methimazole rx (56m66m) as opposed to scan/further testing, etc...  LABS 11/2015 in Epic>  TSH=0.05 (confirmed), FreeT4=1.70, FreeT3=4.0, Thy Stim Ig was wnl;  Sed=14...  LABS 12/29/15> [name] insisted on recheck CBC & Iron level>  CBC- wnl w/ Hg=14.6, Fe=99 (31%sat);  Chems- abn w/ Na=130, TCO=39, Cr=1.2  CXR 01/11/16 showed borderline cardiomeg, elev right hemidiaph, clear lungs, DJD in Tspine...  LABS 12/2015>  Chems- ok w/ BS=152;  CBC- ok w/ Hg=14.1;  TSH=2.39 on Tapazole5...   ~  May 21, 2016:  44mo69mo & post Hosp check>  Since she was last here [name] had several follow up visits w/ specialists and [facility]    She saw DrGherghe 03/01/16>  Throtoxicosis on  Tapazole 56mg 22m; they opted not to pursue diagnostic eval (adenoma vs Graves);  TSH=0.03 11/2015 7  improved to 2.39 on 01/10/16...    Seen by DrMayer- Podiatry 04/19/16> bilat big toenails, onychomycosis w/ debridement performed...     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    Now she reports feeling better, daugh notes she is not active enough &  just stays in her room at [facility], she still has [place]- part time but esp at night;  Breathing is good, on Advair100Bid, NEBS w/ Duoneb Q4H vs Proair prn..    Above prob list reviewed... EXAM reveals Afeb, VSS, O2sat=95% on RA;  Wt= 175#;  HEENT- neg, no thyroid nodule palp;  Chest- clear w/o w/r/r;  Heart- RR gr1/6 SEM no r/g;  Abd- soft, non-tender, neg;  Ext- VI, w/1+ edema, no c/c;  Neuro- intact, walks w/ cane, anxious...  CXR 04/2016>  Aortic atherosclerosis, elev right hemidiaph, clear lungs, DDD...   CT Head 04/23/16>  Mucosal membrane thickening 7 sm amt fluid in left max sinus, otherw unremarkable- no ischemia or infarct...  CT Chest 04/23/16>  Atherosclerotic Ao & coronaries, no adenopathy, sm thyroid nodules noted no change from 2010, elev right hemidiaph & some basilar atx, DJD in spine w/ spurs & 40% T11 compression  Ba Esophagram 04/30/16>  Mod to severe esoph dysmotility noted, no mass or stricture...  LABS 04/2016>  Chems- ok x Cr=1.3-1.6, BS=100-140;  CBC- wnl;  Thyroid- ok on Tapazole... IMP/PLAN>>  Jaslynne is improved & back to baseline; she knows to take her time & be careful eating;  Continue same meds; incr activity level; ROV in 4-6 weeks...   ~  July 16, 2016:  37moROV & Kinda has settled into [facility], still has [place]rock Hallhelping out part time, "I'm doing pretty good", says she's eating slowly/ small bites/ but whatever she wants, better overall on the Xanax0.25Bid;  From the pulm perspective she's using the Advair100Bid & NEB w/ albut vs Proair as needed but she hasn't needed eitherr ecently... Prev w/ HCO3=39 & Cr=1.2 so we adjusted her diuretics- from Demadex20 to 1/2 of that + Diamox250/d & improved...    DrGherghe rechecked her TFTs and they are euthyroid on Tapazole 555mQod...    DaAnsonequests that we recheck her VitD level on her 2000u daily supplement. EXAM reveals Afeb, VSS, O2sat=96% on RA;  Wt= 177#;  HEENT- neg, no thyroid nodule palp;  Chest- clear  w/o w/r/r;  Heart- RR gr1/6 SEM no r/g;  Abd- soft, non-tender, neg;  Ext- VI, w/ tr edema, no c/c;  Neuro- gait abn otherw neg.  LABS 07/16/16>  Chems- wnl w/ HCO3=34, Cr=1.18;  VitD=56... IMP/PLAN>>  Labs look great on current meds> continue Demadex20- 1/2 tab + Diamox250; continue VitD 2000u supplement; be suere to incr activity at [facility]! We plan recheck 54m1mo   ~  December 04, 2016:  4-65mo80mo & general medical f/u visit>  MargEmmalinls me that she loves [facility]- friends, the food "we have ice cream every day";  She reports feeling well & denies new complaints or concerns; we reviewed the following  medical problems during today's office visit >>     Chr Obstructive Asthma> her dyspnea is more from anxiety/panic & not asthma; on Advair100Bid ([facility] requires regular Rx directions) & Proventil rescue prn; notes that Alpraz0.25 helps dyspnea...    HBP> on ASA81, Norvasc5, Demadex20-1/2, Diamox250; BP=120/62, tol meds well; denies CP, palpit, ch in SOB, but incr edema noted recently => needs low sodium diet, incr Demadex20 & change diamox to 4PM daily...    CHOL> on diet alone, refuses meds, last FLP 3/14 shows TChol 177, TG 50, HDL 60, LDL 107    DM> on diet alone, wt up to 189#, BS in epic=100-150, last A1c (2/13) was 6.3 & she knows to restrict carbs etc...    Thyrotoxicosis> on Tapazole5-1/2 tab daily per DrGherghe; f/u TFTs 10/02/16 showed TSH=1.78 and FreeT4=0.70, she is clinically improved...    GI- Reflux, Divertics, constip> prev on Protonix40, now on Zantac150, Miralax; continue same meds.[name]    DJD/ LBP> on OTC analgesics prn & osteobiflex prn; had right THR 2011; known sp stenosis w/ prev ESI...    Anxiety, mild senile dementia, difficult interaction betw pt & daugh> on Xanax0.25Bid & Zoloft50 Qhs EXAM reveals Afeb, VSS, O2sat=93% on RA;  Wt= up 12# to 189#;  HEENT- neg, no thyroid nodule palp;  Chest- clear w/o w/r/r;  Heart- RR gr1/6 SEM no r/g;  Abd- soft, non-tender,  neg;  Ext- VI, w/ 1+ edema, no c/c;  Neuro- gait abn otherw neg.  LABS 12/04/16>  BMet- ok w/ HCO3=34, K=4.0, Cr=1.27...  IMP/PLAN>>  OK 2017 Flu shot today;  She is reminded to eliminate sodium/ salt from her diet, increase Demadex20Qam & take the Diamox250 Qd at 4PM;  She will continue the daily exercises at [facility] & call for any problems...   ~  May 06, 2017:  19moROV & general medical f/u visit>  [name] appears stable overall, she continues to thrive at [place] assistant rosa had knee surg & will not be returning; family notes some dental issues w/ 3 teeth removed by oral surg & partial plate being made; pt ambulates w/ a walker... We reviewed the following medical problems during today's office visit >>     Chr Obstructive Asthma> her dyspnea is more from anxiety/panic & not asthma; on Advair100Bid ([facility] requires regular Rx directions) & Proventil rescue prn; notes that Alpraz0.25Bid helps dyspnea...    HBP> on ASA81, Norvasc5, Demadex20-1/2, Diamox250; BP=120/62, tol meds well; denies CP, palpit, ch in SOB, but incr edema noted recently=> needs low sodium diet, incr Demadex20 & change Diamox to 4PM daily...    CHOL> on diet alone, refuses meds, last FLP 3/14 shows TChol 177, TG 50, HDL 60, LDL 107    DM> on diet alone, wt down to 182#, BS in epic=99-150, last A1c (2/13) was 6.3 & she knows to restrict carbs etc...    Thyrotoxicosis> on Tapazole553m1/2 tab daily per DrGherghe; f/u TFTs 5/18 showed TSH=2.55 and FreeT4=0.75, she is clinically improved & stable- continue same...    GI- Reflux, Divertics, constip> prev on Protonix40, now on Zantac150, Miralax; continue same meds.. [name]  DJD/ LBP> on OTC analgesics (Tylenol) prn & osteobiflex prn; had right THR 2011; known sp stenosis w/ prev ESI...    Anxiety, mild senile dementia, difficult interaction betw pt & daugh> on Xanax0.25Bid & Zoloft50 Qhs EXAM reveals Afeb, VSS, O2sat=93% on RA;  Wt= down 7# to 182#;  HEENT- neg, no  thyroid nodule palp;  Chest- clear w/o w/r/r;  Heart- RR gr1/6 SEM no r/g;  Abd- soft, non-tender, neg;  Ext- VI, w/ 1+ edema, no c/c;  Neuro- gait abn otherw neg.  LABS 05/06/17>  Chems- ok w/ K=3.9, BS=99, Cr=1.11, LFTs wnl;  CBC- ok w/ Hg=13.4;  VitD=62... NOTE- TSH in May was 2.55... IMP/PLAN>>  [name] is stable- rec to continue diet/ exercise/ participation at [facility]; we reviewed meds/ treatments/ etc- continue same...  ~  November 07, 2017:  78moROV & Earle continues to do well at [employer] the help of her [place] and her daughter SEnid Derry.. We reviewed these interval Epic notes/ medical visits>     She saw PULM- TParrett,NP on 08/23/17>   Legs red & swollen, dx w/ cellulitis on top of her VI/ edema; treated w/ Doxy, elevation, local care and incr Torsemide to 240mQam...    She fell 08/27/17 at [facility] w/ left shoulder trauma & left humerus fracture>  Hosp x 3d by [facility] w/ conservative management per Ortho using sling & Tramadol, she followed up w/ DrSupple & is improved...     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    HBP> on ASA81, Norvasc5, Demadex20; BP=130/64, tol meds well; denies CP, palpit, ch in SOB=> needs low sodium diet, continue same meds...    CHOL> on diet alone, refuses meds, last FLP 3/14 shows TChol 177, TG 50, HDL 60, LDL 107    DM> on diet alone, wt down to 177#, BS in epic=125-164, last A1c (6/17) was 6.3 & she knows to restrict carbs etc...     Thyrotoxicosis> on Tapazole42mg-140mtab daily per DrGherghe; f/u TFTs 12/18 showed TSH=3.39 and FreeT4=0.90, she is clinically improved & stable- continue same...    GI- Reflux, Divertics, constip> prev on Protonix40, now on Zantac150, Miralax; continue same meds..    DMarland KitchenD/ LBP> on OTC analgesics (Tylenol) prn & osteobiflex prn; had right THR 2011; known sp stenosis w/ prev ESI...    Anxiety, mild senile dementia, difficult interaction betw pt & daugh> on Xanax0.25- 1/2Bid & Zoloft50 Qhs EXAM reveals Afeb, VSS, O2sat=89% on RA;  Wt= down 5# to 177#;  HEENT- neg, no thyroid nodule palp;  Chest- clear w/o w/r/r;  Heart- RR gr1/6 SEM no r/g;  Abd- soft, non-tender, neg;  Ext- VI, w/ 1+ edema, no c/c;  Neuro- gait abn otherw neg.  CXR 08/27/17 showed heart at upper lim of norm for size & calcif in aorta, lungs mildly hypoinflated, chr elev of right hemidiaph, no CHF or pneumonia, left humeral head & neck fx, intact clavicle  LABS 08/2017 in Hosp>  Chems- ok w/ BS=125-164, Cr~1.1;  CBC w/ Hg ~11, WBC=8K;  TFTs- ok on the Methimazole IMP/PLAN>>  [name] is stable on her current meds- Left shoulder improving w/ conservative management, breathing is at baseline, OK Flu shot today...   ~  ADDENDUM 01/15/18 >> [facility] sent over Urine C&S from 01/10/18 growing >100K Enterobacter sens to cipro/levaquin but pt is intol; also  sens to Sulfa & we will call in SeptraDS #20 one bid x10d til gone...    ~  May 12, 2018:  55moROV & Maty (now 84y/o) [facility], sundowning, & behavioral issues (w/ paranoia) that have plagued [facility] & her daughter, SEnid Derry  They started Depakote250Qhs & rec memory care unit but daughter refused the latter; MMSE was 11/30;  They felt that prev Xanax 0.25Bid caused incr confusion & drowsiness;  Tried Melatonin up to 631mQhs but no benefit;  Also c/o burning in her feet c/w neuropathy symptoms;  DrJaffe wanted to avoid Seroquel,  they started low dose Gabapentin 10013mhs, continue Sertraline25Qhs, continue Melatonin6mg51m, avoid Xanax- DC this, check labs- B12=725, CBC- wnl, Chems- wnl w/ Cr=1.14, TFTs wnl w/ TSH borderline at 4.86... We reviewed the following medical problems during today's office visit>  Unfortunately the pt has continued behavioral issues, insomnia, & anxiety...    Chr Obstructive Asthma> her dyspnea is more from anxiety/panic & not asthma; on Advair100Bid ([facility] requires regular Rx directions) & DUONEB vs Proventil rescue prn; notes that Alpraz0.25- 1/2Bid helps dyspnea, but this was stopped by Neuro.    HBP> on ASA81, Norvasc5, Demadex20; BP=134/62, tol meds well; denies CP, palpit, ch in SOB=> needs low sodium diet, continue same meds...    CHOL> on diet alone, refuses meds, last FLP 3/14 shows TChol 177, TG 50, HDL 60, LDL 107    DM> on diet alone, wt down to 172#, BS in epic<100, last A1c (6/17) was 6.3 & she knows to restrict carbs etc...    Thyrotoxicosis> on Tapazole5mg-89m tab daily per DrGherghe; f/u TFTs 12/18 showed TSH=3.39 and FreeT4=0.90, she is clinically improved & stable- Labs 03/2018 showed TSH=4.86, FreeT3=3.0, FreeT4=1.12 & DrGherghe stopped the Tapazole rx 04/2018...    GI- Reflux, Divertics, constip> prev on Protonix40, now on Zantac150, Miralax; continue same meds..    Marland KitchenJD/ LBP> on OTC analgesics (Tylenol) prn & osteobiflex prn; had right THR 2011; known sp stenosis w/ prev ESI...    Anxiety, senile dementia w/ behavioral abn, difficult interaction betw pt & daugh> off Xanax per Neuro & Zoloft50 Qhs EXAM reveals Afeb, VSS, O2sat=92% on RA;  Wt= down 5# to 172#;  HEENT- neg, no thyroid nodule palp;  Chest- clear w/o w/r/r;  Heart- RR gr1/6 SEM no r/g;  Abd- soft, non-tender, neg;  Ext- VI, w/ 1+ edema, no c/c;  Neuro- gait abn otherw no changes... IMP/PLAN>>  Difficult issues and interaction betw daughter, Pt, & [facility]- for now daugh wants meds crushed & placed in apple sauce  to administer;  betw the med changes requested by [facility], daughter, & Neuro it is hard to keep up- I have suggested [facility] as they are there very often & can clinically assess the pt;  Daughter however prefers to [place]:  >50% of this 30min72m was spent in counseling & coordination of care...          Problem List:       DYSPNEA (ICD-786.05) - long hx of chronic obstructive asthma treated w/ ADVAIR100Bid & PROAIR (she uses them Prn now)... she is a non-smoker w/ some reactive airways disease in the past & retired from [employer] years in 1991... she denies cough, sputum, hemoptysis, worsening dyspnea, wheezing, chest pains, snoring, daytime hypersomnolence, etc...  ~  baseline CXR w/o acute changes...  ~  PFT's 5/02 w/ FVC 2.07 (68%), FEV1=1.36 (57%), and FEV1/FVC ratio=66%, mid-flows 42%... ~  CT Angio 7/10 was neg- x biapical pleuroparenchymal scarring... ~  CXR 10/11 showed sl elev right hemidaiph, mild DJD sp, osteopenia, NAD.[name]. ~  CXR 6/12 showed mild apical scarring, clear & NAD, DJD sp w/ osteophytes... ~  Intermittent dyspnea more related to anxiety & treated w/ KLONOPIN 0.74m 1/2 to 1 tab Bid==> improved. ~  CXR 4/13 showed normal heart size, clear lungs, DJD in TSpine... ~  CXR 2/14 showed normal heart size, clear lungs w/ sl peribronch thickening, DJD in spine, NAD..[name] ~  CXR 3/15 showed norm heart size, clear lungs, elev of right hemidiaph, NAD... ~  She is encouraged to take the Klonopin 0.522mBid regularly- consider taking 1/2 in AM, 1/2 in afternoon, one at bedtime... ~  1/16: she continues w/ mult somatic complaints and intermittent choking episodes etc; she refuses to take the Advair or Klonopin... ~  CXR 1/16 showed norm heart size, clear lungs, mild right diaph eventration- no change, Tspine DJD. DISH/ osteopenia; NAD... ~  3/16: she is stable on current  meds, asked to incr her non-existent exercise program... ~  4/16: went to ER w/ SOB- nothing found x mild edema; given Lasix40, potassium was low, thought to be anxious; in office f/u her daugh confirms anxiety & pt not taking her meds regularly; asked to take meds every day & try the Klonopin... ~  7/16: same thing- went to ER w/ dyspnea, nothing found & given Ativan but she wouldn't take it... ~  8/16: another episode w/ ER eval and symptoms resolved spontaneously on there own... ~  9/16: pt is advised (again) to start regular dosing of Alpraz 0.32m89m 1/2 tab Tid w/ extra 1/2 tab as needed for nerves... ~  12/16: she remains on Advair100-2spBid & Alpraz0.232m632m=> improved... ~  3/17: dyspnea further improved w/ Tapazole32mg/24mx for hyperthyroidism... ~  8/17:  She is stable on meds at [facility]- Advair100Bid, Albut prn, diuretics, Alprazolam, Tapazole, etc...  HYPERTENSION (ICD-401.9) - controlled on NORVASC 32mg d36my & HCTZ 232mgta4mily...  ~  2/13:  BP 144/70 today> tol rx well & denies HA, visual changes, CP, palipit, dizziness, syncope, edema, etc... ~  4/13:  BP= 148/80 & she denies CP, palpit, edema; dyspnea improved w/ Klonopin. ~  6/13:  BP= 132/68 & as noted she has mult somatic complaints... ~  3/14:  on Norvasc10, HCTZ25; BP=136/60, tol meds well; denies CP, palpit, ch in SOB, edema, etc  ~  9/14:  BP controlled on Amlod5 & Hct25-1/2 daily; BP= 130/70 & she denies CP, palpit, dizzy, SOB, edema, etc. ~  3/15: on ASA81, Norvasc5, Lasix20- 1-2/d; BP=136/70, tol meds well; denies CP, palpit, ch in SOB, but notes persist edema in legs; Rec to ch Amlod5 to Losar50, low sodium, elev legs, Lasix40. ~  5/15: on Losar50, Lasix40;  BP= 138/60 & she denies angina pain, palpit, ch in SOB, etc... ~  7/15: on Losar50, she stopped Lasix; BP= 138/78 and they want to go back on Amlod5 + HCT12.5 daily... ~  9/15: on Amlod5, Hct12.5; BP= 128/80 & she likes this combo better- denies CP, palpit,  etc... ~  1/16: on Amlod5, Hct12.5 but ?what she is taking; BP= 130/80 & she has refused the ARBs... ~  3/16: on Amlod5, Hct12.5; BP= 142/80 & she has gained 9# w/ 1-2+ edema; discussed low sodium & change Hct to LASIX20  Qam... ~  5/16: on Amlod5, Lasix20, K20; BP=148/70, recent K=3.0 & daugh notes she's not taking meds regularly; asked to take meds everyday & incr K20Bid... ~  2/17: BP is controlled on Amlod5, Demadex20, K10Bid; BP= 116/58 & she denies CP, palpit, ch in edema, etc... ~  8/17: BP is controlled on Amlod5 + Demadex20-1/2 & diamox250/d;  BP= 128/70 7 rec to continue same...  RIGHT BUNDLE BRANCH BLOCK (ICD-426.4) - on ASA 62m/d... baseline EKG w/ RBBB and 2DEcho 5/02 showed mild asymmetric LVH w/ incr EF... ~  12/15: she had Cards eval by DrNishan> he rec ARB/diuretic but she refused to switch from her Amlod5/ Hct12.5 regimen; she refused EKG due to "allergy to the electrodes"... ~  2DEcho 1/16 showed norm LV size & function w/ EF=65-70%, AoV leaflets mildly thickened w/o AS, MV leaflets mod thickened w/ trivMR, mild RA dil, PAsys=367mg... ~  EKG 4/16 showed NSR, rate72, PACs, LAD, RBBB  VENOUS INSUFFIC & EDEMA >>  ~  3/15: she was switched off Amlod & onto Losar50, Lasix20=>40, plus no salt, elevation, support hose, etc...  ~  9/15: [facility] the [facility] HCT12.[facility] regimen; she has VI & 1+edema but stable, no acute changes... 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 ~  3/16: she remains on the Pred from Derm, now w/ incr edema & 9# wt gain, rec no salt & change Hct to LASIX20/d... ~  5/16: improved on Lasix20 w/ edema decr 7 wt down several lbs... ~  12/16: on ASA81, Norvasc5, off Lasix20, K10-2/d; BP=140/76, tol meds well; denies CP, palpit, ch in SOB, tr edema. ~  3/17: edema increased w/ decr in diuretics and [facility] incr Demadex20/d + Diamox250/d and K10Bid...  HYPERLIPIDEMIA (ICD-272.4) - on diet  alone... she forgets to come to visits FASTING for this blood work ~  FLBurden/07 showed TChol 205, TG 71, HDL 49, LDL 130... ~  FLP 6/12 on diet alone showed TChol 218, TG 50, HDL 66, LDL 129 ~  FLP 2/13 on diet alone showed TChol 171, TG 43, HDL 66, LDL 97 ~  FLP 3/14 on diet alone showed TChol 177, TG 50, HDL 60, LDL 107 ~  She needs to ret FASTING for f/u FLP...  DIABETES MELLITUS, BORDERLINE (ICD-790.29) - on diet alone w/ prev BS's in the 100-160 range... ~  labs in 2008-9 showed BS= 101 to 108 ~  labs 1/10 showed BS= 106, A1c= 5.9 ~  Labs 6/12 showed BS= 93, A1c= 6.6...Marland KitchenMarland Kitchenec diet, exercise... ~  Labs 2/13 showed BS= 92, A1c= 6.3 ~  3/14: on diet alone, wt stable ~174#, BS=97, last A1c (2/13) was 6.3 & she knows to restrict carbs etc. ~  Labs 3/15 showed BS= 95; and BS= 120 in [id number].  ~  Labs 2016 showed BS= 101-135 on diet alone... ~  Labs in 2017 showed BS= 100-150 range on diet alone  THYROTOXICOSIS >> routine labs 11/2015 showed thyrotoxicosis & referred to Endocrine, DrGherghe- Rx w/ Tapazole5m59m & she is clinically improved... ~  Labs 2/13 showed TSH= 2.86 ~  Labs 3/14 showed TSH=1.93 ~  Labs 1/17 showed TSH=0.03-0.09;  FreeT3=4.0;  FreeT4=1.70 => referred to DrGherghe, started on Tapazole5 ~  Labs 2/17 showed TSH=2.93;  FreeT3=2.5;  FreeT4=0.76 ~  She continues to f/u w/ drGherghe for thyroid...  INDIGESTION/ REFLUX SYMPTOMS >> see 10/12 note & PROTONIX 78m68mstarted, further eval if symptoms  persist... ESOPHAGEAL DYSMOTILITY/ PRESBYESOPHAGUS >>  ~  4/13:  She notes some reflux symptoms and excess gas w/ belching; rec to take the Protonix daily & Simethacone vs Tums which she says helps her gas. ~  5/13:  She saw GI DrPerry w/ rec to take Prilosec for her indigestion... ~  5/15:  She presented w/ worsening indigestion, dysphagia, reflux & Prev30 was incr to Bid w/ GI f/u suggested for EGD... ~  6-7/15:  She had GI eval by DrPerry> c/o indigestion, food sticking, & pain; we  incr her PPI to Bid & referred to GI- she saw DrPerry 6/15 (note reviewed), he did an UGI series which showed a mod esoph dysmotility problem (likely presbyesoph) but no mucosal abn evident; we reviewed care w/ eating/ swallowing, incr PPI to Bid, elev HOB etc... ~  9/15:  Symptoms persist but she is not regurg or vomiting, and weight stable; they want to change to Nexium40Bid-OK, and rec proceed w/ MBS by speech path... ~  She continues to have intermit choking episodes and c/o phlegm in her throat; she has tried "everything" & encouraged to f/u w/ GI- DrPerry/ DrJEdwards... ~  She stopped the PPI rx in favor of Zantac150...  DIVERTICULOSIS OF COLON (ICD-562.10) - she takes SENAKOT-S, MIRALAX, Peppermint Tea, & sauerkraut Prn...last colonoscopy 9/02 by DrPerry was WNL...  PYELONEPHRITIS (ICD-590.80) - SEE 1/09 Hospitalization (reviewed)... ~  She saw DrMacDiarmid for her recurrent UTIs, chronic cystitis, urge & stress incont, nocturia; she is INTOL to [place]...  Hx of BREAST CYST (ICD-610.0)  DEGENERATIVE JOINT DISEASE (ICD-715.90) - s/p right hip hemiarthroplasty 11/09 by DrAplington w/ wound complic... then dx w/ loosening of the femoral shaft & had conversion to right THR by DrAlusio 10/11 & much improved... she uses CELEBREX 228m Prn (seldom takes this).  LOW BACK PAIN SYNDROME (ICD-724.2) & SPINAL STENOSIS (ICD-724.00) - severe LBP & spinal stenosis w/ evals by DrRamos & DrNudelman... s/p shots, considering poss surgery vs alternative therapies... she takes Celebrex, Osteobiflex, MVI, Vit D... ~  8/10: eval by DrAplington- diff leg lengths, lift placed in right shoe, then trial Lyrica548m.. ~  12/11:  improved after hip revision surg (to THR) 10/11 w/ better ambulaton... ~  5/13:  DrRamos gave her another ESI for her leg pain related to sp stenosis... ~  3/14:  C/o neuropathic discomfort in legs- eval by Ramos & offered shots in her back; try Lyrica50 in the interim... ~   Persistent leg pain, on OTC "natural" meds she says; encouraged to f/u w/ DrRamos et al...  Hx of ANEMIA (ICD-285.9) - eval by GI in 2002 showed normal EGD and Colon... prob iron malabsorption problem Rx'd w/ Fe infusion... ~  labs 1/10 showed Hg= 14.6, MCV= 89, Fe= 94 ~  labs 12/11 showed Hg= 12.8, MCV= 90, Fe= 33... try Fe supplement + VitC... ~  Labs 6/12 showed Hg= 15.0 ~  Labs 2/13 showed Hg= 14.6 ~  Labs 2/14 showed Hg= 14.8 ~  Labs 3/15 showed Hg= 14.9 ~  Labs 1/16 showed Hg= 14.4 ~  Labs 4/16 showed Hg= 13.7 ~  Labs 2/17 showed Hg= 14.1  DERM:  rash Rx'd by dermatology- OLUX-E foam= clobetasol Foam 0.05%... 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 ~  5/16: she is still on Pred per Derm- currently 1026m...    Past Surgical History:  Procedure Laterality Date  . CATARACT EXTRACTION    . conversion to  right THR    . right hip hemiarthroplasty     total    Outpatient Encounter Medications as of 05/12/2018  Medication Sig  . acetaminophen (TYLENOL) 500 MG tablet Take 1,000 mg by mouth every 4 (four) hours as needed for mild pain.  [name] ADVAIR DISKUS 100-50 MCG/DOSE AEPB INHALE 1 PUFF BY MOUTH TWICE DAILY. RINSE MOUTH AFTER USE TO PREVENT THRUSH.  [name] albuterol (PROAIR HFA) 108 (90 Base) MCG/ACT inhaler USE 1 TO 2 PUFFS EVERY SIX HOURS AS NEEDED. (Patient taking differently: Inhale 1-2 puffs into the lungs every 6 (six) hours as needed for wheezing or shortness of breath. USE 1 TO 2 PUFFS EVERY SIX HOURS AS NEEDED.)  . ALPRAZolam (XANAX) 0.5 MG tablet Take every 12 hours as needed in ADDITION to her scheduled doses. This order is as needed for panic attacks.  [name] amLODipine (NORVASC) 5 MG tablet TAKE ONE TABLET BY MOUTH ONCE DAILY.  [name] ASPIRIN LOW DOSE 81 MG EC tablet TAKE ONE TABLET BY MOUTH ONCE DAILY.  [name] Benzocaine (ANBESOL MT) Apply 1 application topically as needed (for tenderness around loose bridge).  . Ca  Carbonate-Mag Hydroxide (ROLAIDS PO) Take by mouth.  . cetirizine (ZYRTEC) 10 MG tablet TAKE 1 TABLET BY MOUTH ONCE DAILY FOR ALLERGIES.  [name] Cholecalciferol (VITAMIN D3) 2000 units capsule TAKE (1) CAPSULE BY MOUTH ONCE DAILY.  . divalproex (DEPAKOTE ER) 250 MG 24 hr tablet Take 250 mg by mouth daily. At bedtime  . fluticasone (FLONASE) 50 MCG/ACT nasal spray SPRAY 2 SPRAYS INTO EACH NOSTRIL ONCE DAILY.  [name] gabapentin (NEURONTIN) 300 MG capsule Take 300 mg by mouth at bedtime.  [name] ipratropium-albuterol (DUONEB) 0.5-2.5 (3) MG/3ML SOLN Take 3 mLs by nebulization every 4 (four) hours as needed. (Patient taking differently: Take 3 mLs by nebulization every 4 (four) hours as needed (for shortness of breath). )  . MAPAP 500 MG tablet TAKE 2 TABLETS (650MG) BY MOUTH EVERY 4 HOURS AS NEEDED FOR MILD PAIN.  . Melatonin 3 MG SUBL Place 6 mg under the tongue daily.  . Multiple Vitamin (DAILY-VITE) TABS TAKE ONE TABLET BY MOUTH ONCE DAILY.  [name] polyethylene glycol powder (GLYCOLAX/MIRALAX) powder MIX 1 CAPFUL (17G) IN 8 OUNCES OF JUICE/WATER AND DRINK ONCE DAILY ASNEEDED FOR CONSTIPATION.  . ranitidine (ZANTAC) 150 MG tablet TAKE (1) TABLET BY MOUTH ONCE DAILY.  [name] sertraline (ZOLOFT) 50 MG tablet TAKE (1/2) TABLET BY MOUTH ONCE DAILY.  [name] Spacer/Aero-Holding Chambers (AEROCHAMBER PLUS WITH MASK) inhaler Use as instructed  . torsemide (DEMADEX) 20 MG tablet TAKE 20 MG BY MOUTH EVERY MORNING.  . [DISCONTINUED] albuterol (PROAIR HFA) 108 (90 Base) MCG/ACT inhaler USE  2 PUFFS EVERY SIX HOURS AS NEEDED.  . [DISCONTINUED] ALPRAZolam (XANAX) 0.5 MG tablet Take 1tab at 1:30pm and 1tab at 5:30pm to help with pt's anxiety.  . [DISCONTINUED] amoxicillin (AMOXIL) 250 MG capsule Take 1 capsule (250 mg total) by mouth 3 (three) times daily.  . [DISCONTINUED] gabapentin (NEURONTIN) 100 MG capsule Take 1 capsule (100 mg total) by mouth at bedtime.  . [DISCONTINUED] gabapentin (NEURONTIN) 100 MG capsule Take 2 capsules (200 mg  total) by mouth at bedtime. May increase to 3 capsules QHS (341m total) in one week if needed.  . [DISCONTINUED] methimazole (TAPAZOLE) 5 MG tablet TAKE 1/2 TABLET (2.5MG) BY MOUTH EVERY OTHER DAY.  . [DISCONTINUED] oseltamivir (TAMIFLU) 75 MG capsule Take 1 capsule (75 mg total) by mouth daily.  . [DISCONTINUED] oseltamivir (TAMIFLU) 75 MG capsule Take 1 capsule (75  mg total) by mouth 2 (two) times daily.  . [DISCONTINUED] sulfamethoxazole-trimethoprim (BACTRIM DS) 800-160 MG tablet Take 1 tablet by mouth 2 (two) times daily.   No facility-administered encounter medications on file as of 05/12/2018.     Allergies  Allergen Reactions  . Azithromycin Shortness Of Breath  . Ciprofloxacin Other (See Comments)     hallucinations  . Levofloxacin Other (See Comments)    Insomnia, indigestion, tingling sensation in legs  . Furosemide Rash    Bullous pemphigoid  . Latex Rash  . Other Rash    EKG leads caused a rash that required steroids to clear    Immunization History  Administered Date(s) Administered  . Influenza Split 09/12/2011, 09/16/2012  . Influenza Whole 09/18/2006, 08/11/2008, 12/12/2009  . Influenza, High Dose Seasonal PF 12/04/2016, 11/07/2017  . Influenza,inj,Quad PF,6+ Mos 08/17/2013, 07/30/2014, 08/09/2015  . PPD Test 08/09/2015  . Pneumococcal Conjugate-13 08/09/2015  . Tdap 05/14/2011     Current Medications, Allergies, Past Medical History, Past Surgical History, Family History, and Social History were reviewed in [employer] record.    Review of Systems         See HPI - all other systems neg except as noted... The patient complains of decreased hearing, dyspnea on exertion, muscle weakness, and difficulty walking.  The patient denies anorexia, fever, weight loss, weight gain, vision loss, hoarseness, chest pain, syncope, peripheral edema, prolonged cough, headaches, hemoptysis, abdominal pain, melena, hematochezia, severe  indigestion/heartburn, hematuria, incontinence, suspicious skin lesions, transient blindness, depression, unusual weight change, abnormal bleeding, enlarged lymph nodes, and angioedema.     Objective:   Physical Exam     WD, WN, Chr ill appearing 84 y/o WF in NAD... GENERAL:  Alert & oriented; pleasant & cooperative... HEENT:  [place]/AT, EOM-full, EACs-clear, TMs-wnl, NOSE-clear, THROAT-clear & wnl. NECK:  Supple w/ fairROM; no JVD; normal carotid impulses w/o bruits; no thyromegaly or nodules palpated; no lymphadenopathy. CHEST:  Clear to P & A; without wheezes/ rales/ or rhonchi heard... HEART:  Regular Rhythm; without murmurs/ rubs/ or gallops detected... ABDOMEN:  Soft & nontender; normal bowel sounds; no organomegaly or masses palpated... EXT:  mod arthritic changes, walks w/ cane, +venous insuffic & incr 1-2+ edema., scattered varicose veins... NEURO:  CN's intact; motor testing normal; no focal deficits... DERM:   mild intertrig rash under breast, & onychomycosis of toenails...  RADIOLOGY DATA:  Reviewed in the EPIC EMR & discussed w/ the patient...  LABORATORY DATA:  Reviewed in the EPIC EMR & discussed w/ the patient...   Assessment & Plan:    05/06/17>  [name] is stable- rec to continue diet/ exercise/ participation at [facility]; we reviewed mewds/ treatments/ etc- continue same. 11/07/17>  [name] is stable on her current meds- Left shoulder improving w/ conservative management, breathing is at baseline, OK Flu shot today. 6.24.19>   Difficult issues and interaction betw daughter, Pt, & [facility]- for now daugh wants meds crushed & placed in apple sauce to administer;  betw the med changes requested by [facility], daughter, & Neuro it is hard to keep up- I have suggested considering transition of care to the [facility] physician staff as they are there very often & can clinically assess the pt;  Daughter however prefers to transition primary care to DrTabori in  [place]   Hx episodes of SOB>> see above, she has been resistent to trying any of the meds & treatment suggestions that we have given to her (I know she would improve  on Benzo rx)...  12/16> symptoms are better w/ Advair100-2spBid & Alprazolam 0.63mBid admin regularly at [facility]... 12/29/15> we checked the above labs, reminded to use Tylenol prn, no salt, elevate, etc; we decided to decr the Demadex to 1/2 tab Qam & ADD [id number] tab in the afternoon; she is reassured about her health & asked to f/u w/ DrGherghe for Endocrine re thyroid... 07/16/16>   She is stable w/o recurrent dyspnea spells  Hx Asthma> stable on Advair100 & Proair prn; hx anxiety component on Klonopin in past but she is not using! Now on Alpraz & improved.  HBP>  Controlled on Amlod5 + Demadex + Diamox, & off KCl; reminded to take meds regularly & no salt...  RBBB>  Aware & denies CP, palpit, ch in DOE, etc... she thinks that she is allergic to EKG electrodes; EKG 4./16 in ER showed NSR, rate72, PACs, LAD, RBBB  CHOL>  On diet alone & FLP looks reasonable;  We reviewed low chol, low fat diet...  Borderline DM>  BS= 101-135 & last A1c is 6.3;  on diet alone & we reviewed low carb no sweets etc...  Thyrotoxicosis>  Improved on Tapazole5 per DrGherghe => down to 1/2 tab daily...  GI> Indigestion, Divertics> she notes most bowel symptoms resolved off spicey foods;  UGI symptoms w/ dysphagia, food sticking, belch/gas/ etc have not resolved on Protonix40Bid and after GI consult w/ DrPerry; we reviewed her UGI series results and decided to proceed w/ MBS by Speech Path; transiently improved then sought 2nd opinion from DrJEdwards=> on Nexium40Bid...  UTI>  Klebsiella UTI resolved after Septra Rx... She has been eval by DrMacDiarmid.  DJD, LBP, Spinal Stenosis>  Prev evals by Ortho, DrRamos, DrNudelman etc; improved after THR w/ better ambulation; c/o neuropathic discomfort in legs- she will f/u w/ Ramos for shots,  using OTC "natural" meds...  Anxiety>  If she would take the Alpraz, I feel it would help...   Patient's Medications  New Prescriptions   No medications on file  Previous Medications   ACETAMINOPHEN (TYLENOL) 500 MG TABLET    Take 1,000 mg by mouth every 4 (four) hours as needed for mild pain.   ADVAIR DISKUS 100-50 MCG/DOSE AEPB    INHALE 1 PUFF BY MOUTH TWICE DAILY. RINSE MOUTH AFTER USE TO PREVENT THRUSH.   ALBUTEROL (PROAIR HFA) 108 (90 BASE) MCG/ACT INHALER    USE 1 TO 2 PUFFS EVERY SIX HOURS AS NEEDED.   ALPRAZOLAM (XANAX) 0.5 MG TABLET    Take every 12 hours as needed in ADDITION to her scheduled doses. This order is as needed for panic attacks.   AMLODIPINE (NORVASC) 5 MG TABLET    TAKE ONE TABLET BY MOUTH ONCE DAILY.   ASPIRIN LOW DOSE 81 MG EC TABLET    TAKE ONE TABLET BY MOUTH ONCE DAILY.   BENZOCAINE (ANBESOL MT)    Apply 1 application topically as needed (for tenderness around loose bridge).   CA CARBONATE-MAG HYDROXIDE (ROLAIDS PO)    Take by mouth.   CETIRIZINE (ZYRTEC) 10 MG TABLET    TAKE 1 TABLET BY MOUTH ONCE DAILY FOR ALLERGIES.   CHOLECALCIFEROL (VITAMIN D3) 2000 UNITS CAPSULE    TAKE (1) CAPSULE BY MOUTH ONCE DAILY.   DIVALPROEX (DEPAKOTE ER) 250 MG 24 HR TABLET    Take 250 mg by mouth daily. At bedtime   FLUTICASONE (FLONASE) 50 MCG/ACT NASAL SPRAY    SPRAY 2 SPRAYS INTO EACH NOSTRIL ONCE DAILY.   GABAPENTIN (NEURONTIN) 300  MG CAPSULE    Take 300 mg by mouth at bedtime.   IPRATROPIUM-ALBUTEROL (DUONEB) 0.5-2.5 (3) MG/3ML SOLN    Take 3 mLs by nebulization every 4 (four) hours as needed.   MAPAP 500 MG TABLET    TAKE 2 TABLETS (650MG) BY MOUTH EVERY 4 HOURS AS NEEDED FOR MILD PAIN.   MELATONIN 3 MG SUBL    Place 6 mg under the tongue daily.   MULTIPLE VITAMIN (DAILY-VITE) TABS    TAKE ONE TABLET BY MOUTH ONCE DAILY.   POLYETHYLENE GLYCOL POWDER (GLYCOLAX/MIRALAX) POWDER    MIX 1 CAPFUL (17G) IN 8 OUNCES OF JUICE/WATER AND DRINK ONCE DAILY ASNEEDED FOR CONSTIPATION.    RANITIDINE (ZANTAC) 150 MG TABLET    TAKE (1) TABLET BY MOUTH ONCE DAILY.   SERTRALINE (ZOLOFT) 50 MG TABLET    TAKE (1/2) TABLET BY MOUTH ONCE DAILY.   SPACER/AERO-HOLDING CHAMBERS (AEROCHAMBER PLUS WITH MASK) INHALER    Use as instructed   TORSEMIDE (DEMADEX) 20 MG TABLET    TAKE 20 MG BY MOUTH EVERY MORNING.  Modified Medications   No medications on file  Discontinued Medications   ALBUTEROL (PROAIR HFA) 108 (90 BASE) MCG/ACT INHALER    USE  2 PUFFS EVERY SIX HOURS AS NEEDED.   ALPRAZOLAM (XANAX) 0.5 MG TABLET    Take 1tab at 1:30pm and 1tab at 5:30pm to help with pt's anxiety.   AMOXICILLIN (AMOXIL) 250 MG CAPSULE    Take 1 capsule (250 mg total) by mouth 3 (three) times daily.   GABAPENTIN (NEURONTIN) 100 MG CAPSULE    Take 1 capsule (100 mg total) by mouth at bedtime.   GABAPENTIN (NEURONTIN) 100 MG CAPSULE    Take 2 capsules (200 mg total) by mouth at bedtime. May increase to 3 capsules QHS (39m total) in one week if needed.   METHIMAZOLE (TAPAZOLE) 5 MG TABLET    TAKE 1/2 TABLET (2.5MG) BY MOUTH EVERY OTHER DAY.   OSELTAMIVIR (TAMIFLU) 75 MG CAPSULE    Take 1 capsule (75 mg total) by mouth daily.   OSELTAMIVIR (TAMIFLU) 75 MG CAPSULE    Take 1 capsule (75 mg total) by mouth 2 (two) times daily.   SULFAMETHOXAZOLE-TRIMETHOPRIM (BACTRIM DS) 800-160 MG TABLET    Take 1 tablet by mouth 2 (two) times daily.

## 2018-05-12 NOTE — Patient Instructions (Signed)
Today we updated your med list in our EPIC system...    Continue your current medications the same...  We will request for the staff at [facility] to check w/ pharmacy & crush the meds they are allowed to crush & place it in applesauce for [facility] to eat...  Call for any questions...  Let's plan a follow up visit in [id number], and sooner if needed for problems.[name]..[name]

## 2018-05-20 ENCOUNTER — Ambulatory Visit: Payer: Medicare Other | Admitting: Neurology

## 2018-05-23 ENCOUNTER — Telehealth: Payer: Self-pay | Admitting: Neurology

## 2018-05-23 ENCOUNTER — Telehealth: Payer: Self-pay | Admitting: Pulmonary Disease

## 2018-05-23 NOTE — Telephone Encounter (Signed)
Patient daughter [name] calling to let Dr.Jaffe know that the notes she brought in to be reviewed were also given to pts pulmonologist; Dr.Nadel for review. Pt daughter just wanting to know what to do about pts anxiety.

## 2018-05-23 NOTE — Telephone Encounter (Signed)
Called spoke with patient's daughter [name], who was actually out front in the lobby dropping off some notes for Dr Lenna Gilford (copy of notes made for reference)  Per [name], she met with the nursing supervisor on 05/19/18 with some notes/questions for Dr Mariel Sleet but have not heard anything back as of today:  1- Zoloft only 66m > can we increase? Or try Cymbalta? 2- Depakote 258m@ hs > can we have a smaller dose in AM and afternoon and continue at bedtime? 3- Xanax > would Ativan be an alternative to try?  Per Gwendolyn Derrypatient is "saying she is out of breath a lot more frequently during the last 2 weeks and this begins every day any where between 3pm through 10:30pm"  Gwendolyn Derrys asking for SN to look at the notes from the nursing supervisor, to please advise if CBD oil would be appropriate to help with patient's anxiety and to have an order to have patient's saturations checked on a regular basis (per Gwendolyn Derrythis is never checked).  Dr NaLenna Gilfordlease advise, thank you.

## 2018-05-23 NOTE — Telephone Encounter (Signed)
[name] returned my call. After discussing medications with her, I advised her to call Dr Kriste BasqueNadel and ask if would be appropriate to increase sertraline from  1/2 tab QD to 1 full tab. [name] wanted to know about CBD oil. I advised her since it is not regulated by the FDA, we can not recommend it's use.

## 2018-05-23 NOTE — Telephone Encounter (Signed)
[name] returning call. Cb is [phone]

## 2018-05-23 NOTE — Telephone Encounter (Signed)
LMOVM for [name] to return my call

## 2018-05-23 NOTE — Telephone Encounter (Signed)
[facility] SN- [facility]  Let Dr. Jearl KlinefelterJaffy have a chance to go over medications and treatments. Attached note from [place], given to SN. ATC [name], Patient's daughter, left message for her to call back for SN recommendations.

## 2018-05-23 NOTE — Telephone Encounter (Signed)
Spoke with Medco Health SolutionsShirley. She stated that She had just received a phone call from Dr. Moises BloodJaffe's office stating that they wanted Dr. Kriste BasqueNadel to handle patient's medication issues. [name] stated that they have not been pleased with Dr. Moises BloodJaffe's office.   SN, please advise now that Dr. Moises BloodJaffe's office will not help patient. Thanks.

## 2018-05-26 NOTE — Telephone Encounter (Signed)
Per SN- ok to increase Zoloft to 50mg . Depakote 250mg  QHS, it is a slow release already, does not feel comfortable changing med.  Xanax 0.5mg , take 1/2-1 tab TID or Ativan 0.5mg , take 1/2 to 1 tab TID. Called and spoke with [name], Patient's daughter. She understood why SN would not adjust Depakote and agreed to  increasing Zoloft.  She stated that she wanted to try Ativan as needed and discontinue Xanax, to see if that would help.   SN was made aware of conversation with [name].   Call[facility] and [facility] message at [facility], for the Nurse to return call for new orders and to get preferred fax number to fax new orders.

## 2018-05-26 NOTE — Telephone Encounter (Signed)
Arline AspCindy from [facility] is calling Misty StanleyLisa back [phone]

## 2018-05-26 NOTE — Telephone Encounter (Signed)
Called Claytonindy at [facility] and stated to her the information per SN.  Arline AspCindy is needing all the information to be faxed over to them at [phone] in her attention.  Stated to Ashippunindy that pt's daughter [name] was wanting pt to try Ativan instead of the Xanax to see if that would work better for pt. When I stated to Arline AspCindy the instructions of 1/2tab to 1 tab TID, Arline AspCindy stated we needed to specify if we wanted it to be 1/2 tab or if we wanted it to be 1 tab due to them unable to have the directions written like that.

## 2018-05-26 NOTE — Telephone Encounter (Signed)
Left message for Gwendolyn ForestShirley to call me back about medication request.  Will try again later today.

## 2018-05-26 NOTE — Telephone Encounter (Signed)
Per SN- Ativan 0.5mg  PO, 1/2 tab at 1130.  Ativan 0.5mg , 1 tab PO at 1630, per Patient's daughter request.  D/C Xanax and increase Zoloft to 50mg  daily. Orders faxed to [facility] [phone], Attn: Arline Aspindy.  Patient's Daughter aware of new orders.  Nothing further at this time

## 2018-05-29 ENCOUNTER — Telehealth: Payer: Self-pay

## 2018-05-29 NOTE — Telephone Encounter (Signed)
Rcvd notes from [facility] from [place]. [phone] She wanted the following addressed: 1) Zoloft 25mg -questioning if can be increased OR change to Cymbalta? Pt complains quite a bit about pain and burning in feet.  2)depakote 25mg  QHS -questioning if could also add a smaller dose in theAM and afternoon? Pt has a difficult time during early evening and throughout the night.  3)Xanax-questioning would Ativan be an alternative?  I called and spoke with Jasmine DecemberSharon, the director of [facility], Karen KitchensDottie was there also and we discussed the issues on speaker phone. Claris CheMargaret is not in a memory care unit as her Daughter Geralynn RileShirley Broome said. The director has advised that the Pt be moved to memory care, has asked the daughter to attend information sessions about dementia, and to allow the Dr's there to take over the care since they are there and more readily available. [name] is not agreeable or receptive to any suggestions or changes.

## 2018-05-30 NOTE — Telephone Encounter (Signed)
1.  The depakote ER may be increased to 500mg  at bedtime (it is typically a once daily dosing) 2.  If she is only on sertraline 25mg , then it may be discontinued and instead start Cymbalta 30mg  daily 3.  I defer the benzodiazepine to prescribing physician

## 2018-05-30 NOTE — Telephone Encounter (Signed)
Called and LMOVM for Dottie, will call her back on Monday

## 2018-06-06 ENCOUNTER — Other Ambulatory Visit: Payer: Self-pay | Admitting: Pulmonary Disease

## 2018-07-15 ENCOUNTER — Telehealth: Payer: Self-pay | Admitting: Pulmonary Disease

## 2018-07-15 NOTE — Telephone Encounter (Signed)
Called both the patients daughter and the assisted living facility. No one knows what this was about. Will sign off and close encounter

## 2018-07-17 ENCOUNTER — Telehealth: Payer: Self-pay | Admitting: Neurology

## 2018-07-17 NOTE — Telephone Encounter (Signed)
Patients daughter states pt is having throbbing leg pain and wants to know if she can take medication gabapentin in the morning and at night.

## 2018-07-18 NOTE — Telephone Encounter (Signed)
Called and spoke with [name], advised her of recommendation of increase. I will need to call Rx   Called Rx care [phone], [facility]

## 2018-07-18 NOTE — Telephone Encounter (Signed)
She can increase gabapentin to 100mg  in morning and 300mg  at bedtime.  If not improved in 1 week, increase to 100mg  in AM, 100mg  at noon and 300mg  at bedtime.  We can further increase dose as needed and tolerated.

## 2018-08-05 ENCOUNTER — Telehealth: Payer: Self-pay | Admitting: Pulmonary Disease

## 2018-08-05 NOTE — Telephone Encounter (Signed)
Forms signed and faxed to [facility].  Nothing further at this time.

## 2018-08-05 NOTE — Telephone Encounter (Signed)
Routing to Sealed Air Corporationlisa t. Per her she will fax the form off.

## 2018-08-07 ENCOUNTER — Telehealth: Payer: Self-pay | Admitting: Pulmonary Disease

## 2018-08-07 NOTE — Telephone Encounter (Signed)
Pt daughter returning call. Contact number [phone]

## 2018-08-07 NOTE — Telephone Encounter (Signed)
LMTCB

## 2018-08-07 NOTE — Telephone Encounter (Signed)
Pt's daughter [name] is calling back [phone]

## 2018-08-07 NOTE — Telephone Encounter (Signed)
Spoke with patient's daughter, she reports the patient has been more confused since starting Ativan and Depakote. Neurologist also increased Gabapentin to 100mg  in Am and 300mg  at bedtime. Patient is pacing the floors at night, exhibiting exit seeking behaviors, constantly trying to go downstairs and there is no downstairs in the building. She states the patient is sleeping all day and up all night.   SN please advise. Thanks.

## 2018-08-07 NOTE — Telephone Encounter (Signed)
Per SN- Needs to follow up with Neuro, last seen 03/10/18, and transfer care to the [facility] doctor, [facility], need to make the transition.    Called and spoke with Daughter, [name]. SN recommendations given.  [name] does not want to follow up with Neurology, because she stated that she was to by them to follow up with SN. [name] also, does not want to transfer care to [facility] Doctor, but would like to use her family doctor. [name] is going to call back tomorrow, 08/08/18, after talking with [facility] staff and her family Doctor.

## 2018-08-08 ENCOUNTER — Other Ambulatory Visit: Payer: Self-pay | Admitting: *Deleted

## 2018-08-08 NOTE — Telephone Encounter (Signed)
[name], patient's daughter came by office and brought FL2 forms from [facility] to be signed by SN. [name] also requested Ativan change. Patient currently taking Ativan 0.5mg , 1/2 tab at 1130 and 1 tab at 4:30 pm.  [name] requesting Ativan at 4:30 to be changed to 1/2 tab, she feels her Mother is to drowsy in afternoon and is staying up at night. [name] is going to speak with Dr. Beverely Lowabori,  Monday, 08/11/18 about her, or one of her partners,  taking over as PCP for Patient. She is to call Monday with update. New FL2 forms brought by [name], [facility] to [facility].  Per SN- ok for Ativan 0.5mg , take 1/2 tab at 1130am  and 1/2 tab at 4:30pm, by mouth, daily.  Verbal Ativan order given to Arline Aspindy, Charity fundraiserN at [facility]. New Prescription written out and signed by SN, faxed to [facility]. [name] called and updated on Ativan change. Nothing further at this time.

## 2018-08-21 ENCOUNTER — Telehealth: Payer: Self-pay | Admitting: Pulmonary Disease

## 2018-08-21 NOTE — Telephone Encounter (Signed)
Attempted to call pt but no answer. Left message for pt stating that we would let Dr. Kriste Basque know that pt has found a new PCP.  Pt still wants to keep appt she has scheduled with Dr. Kriste Basque in November.  Routing to [employer] as an Financial planner for her and Kriste Basque.  [name] is going to be pt's new PCP after Dr. Kriste Basque.

## 2018-08-22 NOTE — Telephone Encounter (Signed)
SN aware. 

## 2018-08-25 ENCOUNTER — Ambulatory Visit: Payer: Medicare Other | Admitting: Family Medicine

## 2018-09-01 ENCOUNTER — Other Ambulatory Visit: Payer: Self-pay | Admitting: Pulmonary Disease

## 2018-09-02 NOTE — Telephone Encounter (Signed)
Are you ok with the issuance of this medication/ It was in the chart as "historical" was not issued by you for this patient.

## 2018-09-04 ENCOUNTER — Other Ambulatory Visit: Payer: Self-pay | Admitting: Pulmonary Disease
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
## 2018-09-09 ENCOUNTER — Ambulatory Visit (INDEPENDENT_AMBULATORY_CARE_PROVIDER_SITE_OTHER): Payer: Medicare Other | Admitting: Neurology

## 2018-09-09 ENCOUNTER — Encounter: Payer: Self-pay | Admitting: Neurology

## 2018-09-09 ENCOUNTER — Other Ambulatory Visit (INDEPENDENT_AMBULATORY_CARE_PROVIDER_SITE_OTHER): Payer: Medicare Other

## 2018-09-09 VITALS — BP 146/78 | HR 77 | Ht 67.0 in | Wt 173.0 lb

## 2018-09-09 DIAGNOSIS — F0281 Dementia in other diseases classified elsewhere with behavioral disturbance: Secondary | ICD-10-CM

## 2018-09-09 DIAGNOSIS — Z79899 Other long term (current) drug therapy: Secondary | ICD-10-CM | POA: Diagnosis not present

## 2018-09-09 DIAGNOSIS — G609 Hereditary and idiopathic neuropathy, unspecified: Secondary | ICD-10-CM

## 2018-09-09 DIAGNOSIS — G301 Alzheimer's disease with late onset: Secondary | ICD-10-CM | POA: Diagnosis not present

## 2018-09-09 DIAGNOSIS — F02818 Dementia in other diseases classified elsewhere, unspecified severity, with other behavioral disturbance: Secondary | ICD-10-CM

## 2018-09-09 LAB — COMPREHENSIVE METABOLIC PANEL
ALT: 15 U/L (ref 0–35)
AST: 20 U/L (ref 0–37)
Albumin: 4.3 g/dL (ref 3.5–5.2)
Alkaline Phosphatase: 83 U/L (ref 39–117)
BUN: 15 mg/dL (ref 6–23)
CO2: 38 mEq/L — ABNORMAL HIGH (ref 19–32)
CREATININE: 1 mg/dL (ref 0.40–1.20)
Calcium: 10.1 mg/dL (ref 8.4–10.5)
Chloride: 94 mEq/L — ABNORMAL LOW (ref 96–112)
GFR: 54.92 mL/min — ABNORMAL LOW (ref >60.00)
Glucose, Bld: 103 mg/dL — ABNORMAL HIGH (ref 70–99)
Potassium: 4.4 mEq/L (ref 3.5–5.1)
Sodium: 136 mEq/L (ref 135–145)
TOTAL PROTEIN: 7.7 g/dL (ref 6.0–8.3)
Total Bilirubin: 0.5 mg/dL (ref 0.2–1.2)

## 2018-09-09 LAB — CBC
HEMATOCRIT: 43.5 % (ref 36.0–46.0)
Hemoglobin: 14.6 g/dL (ref 12.0–15.0)
MCHC: 33.5 g/dL (ref 30.0–36.0)
MCV: 87.4 fl (ref 78.0–100.0)
Platelets: 181 10*3/uL (ref 150.0–400.0)
RBC: 4.98 Mil/uL (ref 3.87–5.11)
RDW: 13.8 % (ref 11.5–15.5)
WBC: 5.6 10*3/uL (ref 4.0–10.5)

## 2018-09-09 MED ORDER — GABAPENTIN 100 MG PO CAPS: ORAL_CAPSULE | ORAL | 0 refills | Status: DC

## 2018-09-09 NOTE — Patient Instructions (Addendum)
1.  We will titrate up on the gabapentin 100mg  capsules:  Take 2 capsules in morning and 4 capsules at night for 1 week  Then 2 capsules in morning and 5 capsules at night for 1 week  Then 2 capsules in morning and 6 capsules at night 2.  Continue divalproex sprinkle 250mg  at bedtime 3.  Check CBC and CMP 4.  Follow up in 6 months.  Your provider has requested that you have labwork completed today. Please go to [facility] ([id number]) on the second floor of this building before leaving the office today. You do not need to check in. If you are not called within 15 minutes please check with the front desk.

## 2018-09-11 ENCOUNTER — Telehealth: Payer: Self-pay

## 2018-09-11 ENCOUNTER — Ambulatory Visit (INDEPENDENT_AMBULATORY_CARE_PROVIDER_SITE_OTHER): Payer: Medicare Other | Admitting: Family Medicine

## 2018-09-11 ENCOUNTER — Other Ambulatory Visit: Payer: Self-pay

## 2018-09-11 ENCOUNTER — Encounter: Payer: Self-pay | Admitting: Family Medicine

## 2018-09-11 VITALS — BP 118/80 | HR 68 | Temp 97.7°F | Ht 67.0 in | Wt 176.8 lb

## 2018-09-11 DIAGNOSIS — I10 Essential (primary) hypertension: Secondary | ICD-10-CM

## 2018-09-11 DIAGNOSIS — M48061 Spinal stenosis, lumbar region without neurogenic claudication: Secondary | ICD-10-CM | POA: Diagnosis not present

## 2018-09-11 DIAGNOSIS — G609 Hereditary and idiopathic neuropathy, unspecified: Secondary | ICD-10-CM | POA: Diagnosis not present

## 2018-09-11 DIAGNOSIS — J42 Unspecified chronic bronchitis: Secondary | ICD-10-CM

## 2018-09-11 DIAGNOSIS — I872 Venous insufficiency (chronic) (peripheral): Secondary | ICD-10-CM

## 2018-09-11 DIAGNOSIS — Z23 Encounter for immunization: Secondary | ICD-10-CM

## 2018-09-11 DIAGNOSIS — F0391 Unspecified dementia with behavioral disturbance: Secondary | ICD-10-CM

## 2018-09-11 NOTE — Progress Notes (Signed)
Subjective  CC:  Chief Complaint  Patient presents with  . Establish Care    Transfer Care from Dr. Kriste Basque, last physical 6 months ago     HPI: [name] is a 84 y.o. female who presents to [facility] today to establish care with me as a new patient.  I have reviewed multiple old records from prior PCP and specialists. She has dementia with behavioral disturbances and living in assisted living now. Daughter is her with her. She is her primary caregiver. This has been stressful for her. Sees neuro to manage sxs: meds recently adjusted.  She has the following concerns or needs:  Painful peripheral neuropathy: causes her problems. Now on meds to help   Has copd and back pain. Reviewed meds. Recent labs looked great.   Being treated by endocrine for thyrotoxicosis.   No new concerns today.  Does not have Advanced directives, HCPOA or code status.   Assessment  1. Idiopathic peripheral neuropathy   2. Chronic venous insufficiency   3. Essential hypertension   4. Spinal stenosis of lumbar region, unspecified whether neurogenic claudication present   5. Chronic bronchitis, unspecified chronic bronchitis type (HCC)   6. Dementia with behavioral disturbance, unspecified dementia type (HCC)   7. Need for immunization against influenza      Plan   Chronic problems are mostly controlled or managed by specialists. I will follow along and comanage. No changes needed now.   Had beginning discussion regarding dnr status and advanced directives and goals of care.   Update imms.   Follow up:  Return in about 3 months (around 12/12/2018) for recheck. Orders Placed This Encounter  Procedures  . Flu Vaccine QUAD 36+ mos IM   No orders of the defined types were placed in this encounter.    Depression screen [facility] 2/9 09/11/2018 02/15/2014 02/10/2013  Decreased Interest 0 0 1  Down, Depressed, Hopeless 0 0 0  PHQ - 2 Score 0 0 1  Some recent data  might be hidden    We updated and reviewed the patient's past history in detail and it is documented below.  Patient Active Problem List   Diagnosis Date Noted  . Dementia with behavioral disturbance (HCC) 05/12/2018  . Idiopathic peripheral neuropathy 05/12/2018  . Dysphagia 05/09/2016  . Esophageal dysmotility 05/09/2016  . CKD (chronic kidney disease), stage III (HCC) 04/23/2016  . Allergic rhinitis 01/11/2016    Seasonal   . Thyrotoxicosis 12/07/2015  . Insomnia 12-28-202017  . COPD (chronic obstructive pulmonary disease) (HCC) 11/17/2014  . Chronic venous insufficiency 04/14/2014    On demadex daily   . Abnormality of gait 08/17/2013  . GERD (gastroesophageal reflux disease) 09/12/2011  . Anxiety 05/14/2011  . Bullous pemphigoid 12/13/2008    Qualifier: Diagnosis of  By: Kriste Basque MD, Lonzo Cloud    . Diverticulosis of large intestine 12/01/2007    Qualifier: Diagnosis of  By: Kriste Basque MD, Lonzo Cloud    . Constipation 12/01/2007    Qualifier: Diagnosis of  By: Kriste Basque MD, Lonzo Cloud    . Osteoarthritis 12/01/2007    Qualifier: Diagnosis of  By: Kriste Basque MD, Lonzo Cloud    . Spinal stenosis of lumbar region 10/28/2007    Qualifier: Diagnosis of  By: Clent Ridges NP, Tammy     . Elevated lipids 10/27/2007    Qualifier: Diagnosis of  By: Yetta Barre CNA/MA, Shanda Bumps     . Essential hypertension 10/27/2007    Qualifier: Diagnosis of  By:  Yetta Barre CNA/MA, Shanda Bumps     . RIGHT BUNDLE BRANCH BLOCK 10/27/2007    Qualifier: Diagnosis of  By: Yetta Barre CNA/MA, Shanda Bumps     . LOW BACK PAIN SYNDROME 10/27/2007    Qualifier: Diagnosis of  By: Yetta Barre CNA/MA, Shanda Bumps     . Impaired fasting glucose 10/27/2007    Qualifier: Diagnosis of  By: Yetta Barre CNA/MA, [facility] Maintenance  Topic Date Due  . PNA vac Low Risk Adult (2 of 2 - PPSV23) 08/08/2016  . DEXA SCAN  10/12/2018 (Originally 02/08/1990)  . TETANUS/TDAP  05/13/2021  . INFLUENZA VACCINE  Completed   Immunization History    Administered Date(s) Administered  . Influenza Split 09/12/2011, 09/16/2012  . Influenza Whole 09/18/2006, 08/11/2008, 12/12/2009  . Influenza, High Dose Seasonal PF 12/04/2016, 11/07/2017  . Influenza,inj,Quad PF,6+ Mos 08/17/2013, 07/30/2014, 08/09/2015, 09/11/2018  . PPD Test 08/09/2015  . Pneumococcal Conjugate-13 08/09/2015  . Tdap 05/14/2011   Current Meds  Medication Sig  . ADVAIR DISKUS 100-50 MCG/DOSE AEPB INHALE 1 PUFF BY MOUTH TWICE DAILY. RINSE MOUTH AFTER USE TO PREVENT THRUSH.  [name] amLODipine (NORVASC) 5 MG tablet TAKE ONE TABLET BY MOUTH ONCE DAILY.  [name] ASPIRIN LOW DOSE 81 MG EC tablet TAKE ONE TABLET BY MOUTH ONCE DAILY.  . cetirizine (ZYRTEC) 10 MG tablet TAKE 1 TABLET BY MOUTH ONCE DAILY FOR ALLERGIES.  [name] Cholecalciferol (VITAMIN D3) 2000 units capsule TAKE (1) CAPSULE BY MOUTH ONCE DAILY.  . divalproex (DEPAKOTE SPRINKLE) 125 MG capsule OPEN (2) CAPSULES (250MG ) AND SPRINKLE ON APPLESAUCE AT BEDTIME.  . fluticasone (FLONASE) 50 MCG/ACT nasal spray SPRAY 2 SPRAYS INTO EACH NOSTRIL ONCE DAILY.  [name] gabapentin (NEURONTIN) 100 MG capsule Take 2 capsules in AM and 4 capsules in PM for 1 wk, then 2 capsules in AM and 5 capsules in PM for 1 wk, then 2 capsules in AM and 6 capsules in PM  . LORazepam (ATIVAN) 0.5 MG tablet TAKE (1/2) TABLET BY MOUTH TWICE DAILY.(11:30AM & 4:30PM)  . MAPAP 500 MG tablet TAKE 2 TABLETS (650MG ) BY MOUTH EVERY 4 HOURS AS NEEDED FOR MILD PAIN.  . Melatonin 3 MG SUBL Place 6 mg under the tongue daily.  . Multiple Vitamin (DAILY-VITE) TABS TAKE ONE TABLET BY MOUTH ONCE DAILY.  . ranitidine (ZANTAC) 150 MG tablet TAKE (1) TABLET BY MOUTH ONCE DAILY.  [name] sertraline (ZOLOFT) 50 MG tablet TAKE (1) TABLET BY MOUTH ONCE DAILY.  [name] torsemide (DEMADEX) 20 MG tablet TAKE 20 MG BY MOUTH EVERY MORNING.    Allergies: Patient is allergic to azithromycin; ciprofloxacin; levofloxacin; furosemide; latex; and other. Past Medical History Patient  has a past medical  history of Anemia, unspecified, COPD (chronic obstructive pulmonary disease) (HCC), Diverticulosis of colon (without mention of hemorrhage), DJD (degenerative joint disease), GERD (gastroesophageal reflux disease), Left humeral fracture (08/28/2017), Lumbago, Osteoarthrosis, unspecified whether generalized or localized, unspecified site, Other abnormal glucose, Other and unspecified hyperlipidemia, Pyelonephritis, Pyelonephritis, unspecified, Right bundle branch block, Shortness of breath, Solitary cyst of breast, Spinal stenosis, unspecified region other than cervical, Unspecified essential hypertension, and UTI (lower urinary tract infection). Past Surgical History Patient  has a past surgical history that includes Cataract extraction; right hip hemiarthroplasty; and conversion to right THR. Family History: Patient family history includes Cancer in her brother, brother, and brother; Diabetes in her daughter; Heart failure in her father. Social History:  Patient  reports that she has never smoked. She has never used smokeless tobacco. She reports that she does not  drink alcohol or use drugs.  Review of Systems: Constitutional: negative for fever or malaise Ophthalmic: negative for photophobia, double vision or loss of vision Cardiovascular: negative for chest pain, dyspnea on exertion, or new LE swelling Respiratory: negative for SOB or persistent cough Gastrointestinal: negative for abdominal pain, change in bowel habits or melena Genitourinary: negative for dysuria or gross hematuria Musculoskeletal: negative for new gait disturbance or muscular weakness Integumentary: negative for new or persistent rashes Neurological: negative for TIA or stroke symptoms Psychiatric: negative for SI or delusions Allergic/Immunologic: negative for hives  Patient Care Team    Relationship Specialty Notifications Start End  Willow Ora, MD PCP - General Family Medicine  09/11/18   Drema Dallas, DO  Consulting Physician Neurology  09/11/18   Carlus Pavlov, MD Consulting Physician Endocrinology  09/11/18     Objective  Vitals: BP 118/80   Pulse 68   Temp 97.7 F (36.5 C)   Ht 5\' 7"  (1.702 m)   Wt 176 lb 12.8 oz (80.2 kg)   BMI 27.69 kg/m  General:  Well developed, well nourished, no acute distress, kyphotic Psych:  Alert and oriented,normal mood and affect, alert and oriented x 3 HEENT:  Normocephalic, atraumatic, non-icteric sclera, PERRL, oropharynx is without mass or exudate, supple neck without adenopathy, mass or thyromegaly Cardiovascular:  RRR without gallop, Respiratory:  Good breath sounds bilaterally, CTAB with normal respiratory effort Gastrointestinal: normal bowel sounds, soft, non-tender, no noted masses. No HSM Skin:  Warm, no rashes or suspicious lesions noted Neurologic:    Mental status is normal. Gross motor and sensory exams are normal.   No visits with results within 1 Day(s) from this visit.  Latest known visit with results is:  Lab on 09/09/2018  Component Date Value Ref Range Status  . Sodium 09/09/2018 136  135 - 145 mEq/L Final  . Potassium 09/09/2018 4.4  3.5 - 5.1 mEq/L Final  . Chloride 09/09/2018 94* 96 - 112 mEq/L Final  . CO2 09/09/2018 38* 19 - 32 mEq/L Final  . Glucose, Bld 09/09/2018 103* 70 - 99 mg/dL Final  . BUN 16/08/9603 15  6 - 23 mg/dL Final  . Creatinine, Ser 09/09/2018 1.00  0.40 - 1.20 mg/dL Final  . Total Bilirubin 09/09/2018 0.5  0.2 - 1.2 mg/dL Final  . Alkaline Phosphatase 09/09/2018 83  39 - 117 U/L Final  . AST 09/09/2018 20  0 - 37 U/L Final  . ALT 09/09/2018 15  0 - 35 U/L Final  . Total Protein 09/09/2018 7.7  6.0 - 8.3 g/dL Final  . Albumin 54/07/8118 4.3  3.5 - 5.2 g/dL Final  . Calcium 14/78/2956 10.1  8.4 - 10.5 mg/dL Final  . GFR 21/30/8657 54.92* >60.00 mL/min Final  . WBC 09/09/2018 5.6  4.0 - 10.5 K/uL Final  . RBC 09/09/2018 4.98  3.87 - 5.11 Mil/uL Final  . Platelets 09/09/2018 181.0  150.0 - 400.0 K/uL  Final  . Hemoglobin 09/09/2018 14.6  12.0 - 15.0 g/dL Final  . HCT 84/69/6295 43.5  36.0 - 46.0 % Final  . MCV 09/09/2018 87.4  78.0 - 100.0 fl Final  . MCHC 09/09/2018 33.5  30.0 - 36.0 g/dL Final  . RDW 28/41/3244 13.8  11.5 - 15.5 % Final   Lab Results  Component Value Date   TSH 4.860 (H) 04/10/2018   Lab Results  Component Value Date   CHOL 177 02/10/2013   HDL 60.10 02/10/2013   LDLCALC 107 (H) 02/10/2013  LDLDIRECT 129.0 05/14/2011   TRIG 50.0 02/10/2013   CHOLHDL 3 02/10/2013      Commons side effects, risks, benefits, and alternatives for medications and treatment plan prescribed today were discussed, and the patient expressed understanding of the given instructions. Patient is instructed to call or message via MyChart if he/she has any questions or concerns regarding our treatment plan. No barriers to understanding were identified. We discussed Red Flag symptoms and signs in detail. Patient expressed understanding regarding what to do in case of urgent or emergency type symptoms.   Medication list was reconciled, printed and provided to the patient in AVS. Patient instructions and summary information was reviewed with the patient as documented in the AVS. This note was prepared with assistance of Dragon voice recognition software. Occasional wrong-word or sound-a-like substitutions may have occurred due to the inherent limitations of voice recognition software

## 2018-09-11 NOTE — Telephone Encounter (Signed)
-----   Message from Drema Dallas, DO sent at 09/10/2018 11:48 AM EDT ----- Labs look stable

## 2018-09-11 NOTE — Patient Instructions (Signed)
Please return in 3 months for recheck.   Think about your Resuscitate Status: would you want heroic measures to be taken in the case that your heart and lungs stopped? See DNR status below.   It was a pleasure meeting you today! Thank you for choosing [place] to meet your healthcare needs! I truly look forward to working with you. If you have any questions or concerns, please send me a message via Mychart or call the office at [phone].   Advance Directive Advance directives are legal documents that let you make choices ahead of time about your health care and medical treatment in case you become unable to communicate for yourself. Advance directives are a way for you to communicate your wishes to family, friends, and health care providers. This can help convey your decisions about end-of-life care if you become unable to communicate. Discussing and writing advance directives should happen over time rather than all at once. Advance directives can be changed depending on your situation and what you want, even after you have signed the advance directives. If you do not have an advance directive, some states assign family decision makers to act on your behalf based on how closely you are related to them. Each state has its own laws regarding advance directives. You may want to check with your health care provider, attorney, or state representative about the laws in your state. There are different types of advance directives, such as:  Medical power of attorney.  Living will.  Do not resuscitate (DNR) or do not attempt resuscitation (DNAR) order.  Health care proxy and medical power of attorney A health care proxy, also called a health care agent, is a person who is appointed to make medical decisions for you in cases in which you are unable to make the decisions yourself. Generally, people choose someone they know well and trust to represent their preferences. Make sure to ask this person for an  agreement to act as your proxy. A proxy may have to exercise judgment in the event of a medical decision for which your wishes are not known. A medical power of attorney is a legal document that names your health care proxy. Depending on the laws in your state, after the document is written, it may also need to be:  Signed.  Notarized.  Dated.  Copied.  Witnessed.  Incorporated into your medical record.  You may also want to appoint someone to manage your financial affairs in a situation in which you are unable to do so. This is called a durable power of attorney for finances. It is a separate legal document from the durable power of attorney for health care. You may choose the same person or someone different from your health care proxy to act as your agent in financial matters. If you do not appoint a proxy, or if there is a concern that the proxy is not acting in your best interests, a court-appointed guardian may be designated to act on your behalf. Living will A living will is a set of instructions documenting your wishes about medical care when you cannot express them yourself. Health care providers should keep a copy of your living will in your medical record. You may want to give a copy to family members or friends. To alert caregivers in case of an emergency, you can place a card in your wallet to let them know that you have a living will and where they can find it. A living will is  used if you become:  Terminally ill.  Incapacitated.  Unable to communicate or make decisions.  Items to consider in your living will include:  The use or non-use of life-sustaining equipment, such as dialysis machines and breathing machines (ventilators).  A DNR or DNAR order, which is the instruction not to use cardiopulmonary resuscitation (CPR) if breathing or heartbeat stops.  The use or non-use of tube feeding.  Withholding of food and fluids.  Comfort (palliative) care when the goal  becomes comfort rather than a cure.  Organ and tissue donation.  A living will does not give instructions for distributing your money and property if you should pass away. It is recommended that you seek the advice of a lawyer when writing a will. Decisions about taxes, beneficiaries, and asset distribution will be legally binding. This process can relieve your family and friends of any concerns surrounding disputes or questions that may come up about the distribution of your assets. DNR or DNAR A DNR or DNAR order is a request not to have CPR in the event that your heart stops beating or you stop breathing. If a DNR or DNAR order has not been made and shared, a health care provider will try to help any patient whose heart has stopped or who has stopped breathing. If you plan to have surgery, talk with your health care provider about how your DNR or DNAR order will be followed if problems occur. Summary  Advance directives are the legal documents that allow you to make choices ahead of time about your health care and medical treatment in case you become unable to communicate for yourself.  The process of discussing and writing advance directives should happen over time. You can change the advance directives, even after you have signed them.  Advance directives include DNR or DNAR orders, living wills, and designating an agent as your medical power of attorney. This information is not intended to replace advice given to you by your health care provider. Make sure you discuss any questions you have with your health care provider. Document Released: 02/12/2008 Document Revised: 09/24/2016 Document Reviewed: 09/24/2016 Elsevier Interactive Patient Education  2017 [employer].

## 2018-09-11 NOTE — Telephone Encounter (Signed)
Called and spoke with daughter, advised labs are stable

## 2018-09-13 ENCOUNTER — Encounter: Payer: Self-pay | Admitting: Family Medicine

## 2018-09-29 ENCOUNTER — Other Ambulatory Visit: Payer: Self-pay | Admitting: Neurology

## 2018-09-30 ENCOUNTER — Telehealth: Payer: Self-pay | Admitting: Pulmonary Disease

## 2018-09-30 NOTE — Telephone Encounter (Signed)
Attempted to call [name] but there was no answer. LM for her to call back to discuss.

## 2018-09-30 NOTE — Telephone Encounter (Signed)
Daughter is returning phone call.  Phone number is [phone].

## 2018-09-30 NOTE — Telephone Encounter (Signed)
Called and spoke with [name]. She stated that her Mother has a red, broken skin area on her right buttock.  She stated that she started placing neosporin and ADT ointment on reddened area.  She stated that she spends most of the time in the recliner, except when she goes to the dining room for meals. Suggested placing a small pillow, or small folded blanket under Patient's buttock, alternating sides, every 2-3 hours, to change her position, to prevent further breakdown. [name] stated understanding. Patient has upcoming appointment 10/13/18 with Dr. Kriste BasqueNadel.  Will give to Dr. Kriste BasqueNadel as Lorain ChildesFYI for further recommendations

## 2018-10-06 ENCOUNTER — Telehealth: Payer: Self-pay | Admitting: Pulmonary Disease

## 2018-10-06 ENCOUNTER — Other Ambulatory Visit (INDEPENDENT_AMBULATORY_CARE_PROVIDER_SITE_OTHER): Payer: Medicare Other

## 2018-10-06 DIAGNOSIS — N390 Urinary tract infection, site not specified: Secondary | ICD-10-CM

## 2018-10-06 LAB — URINALYSIS, ROUTINE W REFLEX MICROSCOPIC
BILIRUBIN URINE: NEGATIVE
HGB URINE DIPSTICK: NEGATIVE
Ketones, ur: NEGATIVE
Nitrite: NEGATIVE
PH: 5 (ref 5.0–8.0)
SPECIFIC GRAVITY, URINE: 1.015 (ref 1.000–1.030)
Total Protein, Urine: NEGATIVE
UROBILINOGEN UA: 0.2 (ref 0.0–1.0)
Urine Glucose: NEGATIVE

## 2018-10-06 NOTE — Telephone Encounter (Signed)
Per SN- Bactrim #14, take 1 by mouth BID, til gone. Verbal order called to [facility].  Prescription faxed to [facility] at [phone], [place]. Nothing further needed.

## 2018-10-06 NOTE — Telephone Encounter (Signed)
Per SN- ok for UA and urine culture. Get clean catch in sterile cup and bring back to lab.  [place].  Sterile cups placed at front desk for pick up. Instructions given.

## 2018-10-06 NOTE — Telephone Encounter (Signed)
Patient daughter in the lobby - wanting urine sample cup. -pr

## 2018-10-06 NOTE — Telephone Encounter (Signed)
Patient's daughter showed up to our office seeking a specimen cup. Advised her that SN has not replied to the message yet and he is currently at lunch. Advised her that someone from our office will contact her once SN has decided to order the urine test. She verbalized understanding.   Will leave encounter open for SN's response.

## 2018-10-06 NOTE — Telephone Encounter (Signed)
Spoke with patient's daughter. She states that she believes the patient has a UTI. For the past few days, patient has complained of pelvic pain when urinating and increased confusion per daughter. She wants to know if it would be possible for the patient to come by our office and obtain a urine test.   Advised patient that I would check with SN.   SN, please advise. Thanks!

## 2018-10-07 ENCOUNTER — Telehealth: Payer: Self-pay | Admitting: Pulmonary Disease

## 2018-10-07 NOTE — Telephone Encounter (Signed)
Spoke with [name],, he wanted to know if the sig could be changed to every 6 hours because by law they cannot have a sig with a range. I advised him to just use every 6 hours since this sig was on her previous prescriptions.. Nothing further is needed.

## 2018-10-07 NOTE — Telephone Encounter (Signed)
Called and spoke with Apolinar JunesBrandon, Rx pharmacy.  Clarified Bactrim DS, 1 tab twice a day, #14.  Understanding stated.  Nothing further.

## 2018-10-07 NOTE — Telephone Encounter (Signed)
Called and spoke with Apolinar JunesBrandon, pharmacist who stated they need the clarification of the medication bactrim that was sent over to the pharmacy. There was no strength or dosage so he needs to know if this is a double strength or not.   Misty StanleyLisa please advise on this, thank you.

## 2018-10-08 ENCOUNTER — Telehealth: Payer: Self-pay | Admitting: Pulmonary Disease

## 2018-10-08 LAB — URINE CULTURE
MICRO NUMBER: 91385947
SPECIMEN QUALITY: ADEQUATE

## 2018-10-08 NOTE — Telephone Encounter (Signed)
[place].  Urine results given.  Understanding stated.  Patient's next OV is 10/13/18 with Dr. Kriste BasqueNadel.  Patient has new PCP Dr. Mardelle MatteAndy, that they will follow up with after next OV. Nothing further at this time.

## 2018-10-08 NOTE — Telephone Encounter (Signed)
Per Misty StanleyLisa send message to her to address.

## 2018-10-13 ENCOUNTER — Ambulatory Visit (INDEPENDENT_AMBULATORY_CARE_PROVIDER_SITE_OTHER): Payer: Medicare Other | Admitting: Pulmonary Disease

## 2018-10-13 ENCOUNTER — Encounter: Payer: Self-pay | Admitting: Pulmonary Disease

## 2018-10-13 VITALS — BP 128/68 | HR 72 | Temp 98.0°F | Ht 66.0 in | Wt 177.8 lb

## 2018-10-13 DIAGNOSIS — N183 Chronic kidney disease, stage 3 unspecified: Secondary | ICD-10-CM

## 2018-10-13 DIAGNOSIS — I4510 Unspecified right bundle-branch block: Secondary | ICD-10-CM | POA: Diagnosis not present

## 2018-10-13 DIAGNOSIS — G609 Hereditary and idiopathic neuropathy, unspecified: Secondary | ICD-10-CM

## 2018-10-13 DIAGNOSIS — I872 Venous insufficiency (chronic) (peripheral): Secondary | ICD-10-CM

## 2018-10-13 DIAGNOSIS — M159 Polyosteoarthritis, unspecified: Secondary | ICD-10-CM

## 2018-10-13 DIAGNOSIS — I10 Essential (primary) hypertension: Secondary | ICD-10-CM

## 2018-10-13 DIAGNOSIS — E0580 Other thyrotoxicosis without thyrotoxic crisis or storm: Secondary | ICD-10-CM | POA: Diagnosis not present

## 2018-10-13 DIAGNOSIS — F419 Anxiety disorder, unspecified: Secondary | ICD-10-CM

## 2018-10-13 DIAGNOSIS — F0391 Unspecified dementia with behavioral disturbance: Secondary | ICD-10-CM

## 2018-10-13 DIAGNOSIS — M150 Primary generalized (osteo)arthritis: Secondary | ICD-10-CM

## 2018-10-13 NOTE — Progress Notes (Signed)
Subjective:    Patient ID: [name], female    DOB: Mar 07, 1923, 84 y.o.   MRN: [id number]  HPI 84 y/o WF here for a follow up visit... she has multiple medical problems as noted below...  Followed for general medical purposes w/ hx chr obstructive asthma, severe episodic dyspnea from anxiety, HBP, RBBB, Hypercholesterolemia, borderline DM, DJD, LBP w/ sp stenosis, etc... ~  SEE PREV EPIC NOTES FOR THE OLDER DATA >>     CTAngio Chest 05/2009 showed no evid of PE, biapical pleuroparenchymal scarring otherw clear lungs, no adenopathy/ effusions/ etc...  CXR 3/15 showed norm heart size, clear lungs, elev of right hemidiaph, NAD...  LABS 3/15:  Chems- wnl;  CBC- wnl;  BNP=26...  LABS 5/15:  Chems- wnl x BS=120;  CBC- wnl;  BNP= 225    CXR 1/16 showed norm heart size, clear lungs, mild right diaph eventration- no change, Tspine DJD. DISH/ osteopenia; NAD...   2DEcho 1/16 showed norm LV size & function w/ EF=65-70%, AoV leaflets mildly thickened w/o AS, MV leaflets mod thickened w/ trivMR, mild RA dil, PAsys=48mHg...  LABS 1/16:  Chems- wnl w/ Cr=0.95;  BNP=66;  CBC- wnl w/ Hg=14.4..Marland KitchenMarland Kitchen CXR 4/16 in ER> norm heart size, atherosclerosis of Ao, no edema or consolidation in lungs, eventration of right hemidiaph- no change, DJD spine.  EKG 4/16 in ER> NSR, rate72, PACs, LAD, RBBB  LABS 4/16 in ER> Chems- wnl x K=3.0;  CBC- wnl;  Troponin=neg;  BNP=69  CXR 7/24 & 07/15/15 showed norm heart size, tortuous Ao, clear lungs w/ mild elev of right hemidiaph, NAD..Marland KitchenMarland Kitchen EKG 07/15/15 showed chronic changes- NSR/ rate88/ RBBB/ LAD/ old infer scar/ no acute abnormalities...  LABS 7-06/2015> Chems- wnl x BS=127-147;  BNP=55;  Troponins=neg;  CBC- wnl ~  07/21/15> I had another long talk w/ [name]& her daughter SEnid [name] I have again rec that she use a low dose of the ALPRAZOLAM 0.572mtabs- 1/2 tab Tid regularly at breakfast, lunch, & dinner; she may also take an extra 1/2 tab prn anytime she feels  anxious or panic setting in;  We have suggested similar plan many times in the past & for whatever reason she will not do it!  I have offered Psyche referral or to set her up w/ a counselor but this too is declined;  Gwendolyn Derrys having a very hard time w/ her mother's attitude, lack of cooperation, mild dementia at age [age]etc...  ~  12/01/15>  Seen by Endocrine/ DrGherghe for suppressed TSH- exam of neck was neg, TFT labs proved thyrotoxicosis, TSI was neg as was a Sed rate (14); they chose to try low dose Methimazole rx (15m315m) as opposed to scan/further testing, etc...  LABS 11/2015 in Epic>  TSH=0.05 (confirmed), FreeT4=1.70, FreeT3=4.0, Thy Stim Ig was wnl;  Sed=14...  LABS 12/29/15> [name] insisted on recheck CBC & Iron level>  CBC- wnl w/ Hg=14.6, Fe=99 (31%sat);  Chems- abn w/ Na=130, TCO=39, Cr=1.2  CXR 01/11/16 showed borderline cardiomeg, elev right hemidiaph, clear lungs, DJD in Tspine...  LABS 12/2015>  Chems- ok w/ BS=152;  CBC- ok w/ Hg=14.1;  TSH=2.39 on Tapazole5...   ~  May 21, 2016:  61mo59mo & post Hosp check>  Since she was last here [name] had several follow up visits w/ specialists and [id number]    She saw DrGherghe 03/01/16>  Throtoxicosis on  Tapazole 15mg 87m; they opted not to pursue diagnostic eval (adenoma vs Graves);  TSH=0.03 11/2015 7  improved to 2.39 on 01/10/16...    Seen by DrMayer- Podiatry 04/19/16> bilat big toenails, onychomycosis w/ debridement performed...     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    Now she reports feeling better, daugh notes she is not active enough &  just stays in her room at [facility], she still has [name]- part time but esp at night;  Breathing is good, on Advair100Bid, NEBS w/ Duoneb Q4H vs Proair prn..    Above prob list reviewed... EXAM reveals Afeb, VSS, O2sat=95% on RA;  Wt= 175#;  HEENT- neg, no thyroid nodule palp;  Chest- clear w/o w/r/r;  Heart- RR gr1/6 SEM no r/g;  Abd- soft, non-tender, neg;  Ext- VI, w/1+ edema, no c/c;  Neuro- intact, walks w/ cane, anxious...  CXR 04/2016>  Aortic atherosclerosis, elev right hemidiaph, clear lungs, DDD...   CT Head 04/23/16>  Mucosal membrane thickening 7 sm amt fluid in left max sinus, otherw unremarkable- no ischemia or infarct...  CT Chest 04/23/16>  Atherosclerotic Ao & coronaries, no adenopathy, sm thyroid nodules noted no change from 2010, elev right hemidiaph & some basilar atx, DJD in spine w/ spurs & 40% T11 compression  Ba Esophagram 04/30/16>  Mod to severe esoph dysmotility noted, no mass or stricture...  LABS 04/2016>  Chems- ok x Cr=1.3-1.6, BS=100-140;  CBC- wnl;  Thyroid- ok on Tapazole... IMP/PLAN>>  [name] is improved & back to baseline; she knows to take her time & be careful eating;  Continue same meds; incr activity level; ROV in 4-6 weeks...   ~  July 16, 2016:  56moROV & [name] has settled into [facility], still has [place]sor [place]helping out part time, "I'm doing pretty good", says she's eating slowly/ small bites/ but whatever she wants, better overall on the Xanax0.25Bid;  From the pulm perspective she's using the Advair100Bid & NEB w/ albut vs Proair as needed but she hasn't needed eitherr ecently... Prev w/ HCO3=39 & Cr=1.2 so we adjusted her diuretics- from Demadex20 to 1/2 of that + Diamox250/d & improved...    DrGherghe rechecked her TFTs and they are euthyroid on Tapazole 5356mQod...    DaMarion Heightsequests that we recheck her VitD level on her 2000u daily supplement. EXAM reveals Afeb, VSS, O2sat=96% on RA;  Wt= 177#;  HEENT- neg, no thyroid nodule palp;  Chest- clear  w/o w/r/r;  Heart- RR gr1/6 SEM no r/g;  Abd- soft, non-tender, neg;  Ext- VI, w/ tr edema, no c/c;  Neuro- gait abn otherw neg.  LABS 07/16/16>  Chems- wnl w/ HCO3=34, Cr=1.18;  VitD=56... IMP/PLAN>>  Labs look great on current meds> continue Demadex20- 1/2 tab + Diamox250; continue VitD 2000u supplement; be suere to incr activity at [facility]! We plan recheck 56m29mo   ~  December 04, 2016:  4-57mo26mo & general medical f/u visit>  MargAngelicels me that she loves [facility]- friends, the food "we have ice cream every day";  She reports feeling well & denies new complaints or concerns; we reviewed the following  medical problems during today's office visit >>     Chr Obstructive Asthma> her dyspnea is more from anxiety/panic & not asthma; on Advair100Bid ([facility] requires regular Rx directions) & Proventil rescue prn; notes that Alpraz0.25 helps dyspnea...    HBP> on ASA81, Norvasc5, Demadex20-1/2, Diamox250; BP=120/62, tol meds well; denies CP, palpit, ch in SOB, but incr edema noted recently => needs low sodium diet, incr Demadex20 & change diamox to 4PM daily...    CHOL> on diet alone, refuses meds, last FLP 3/14 shows TChol 177, TG 50, HDL 60, LDL 107    DM> on diet alone, wt up to 189#, BS in epic=100-150, last A1c (2/13) was 6.3 & she knows to restrict carbs etc...    Thyrotoxicosis> on Tapazole5-1/2 tab daily per DrGherghe; f/u TFTs 10/02/16 showed TSH=1.78 and FreeT4=0.70, she is clinically improved...    GI- Reflux, Divertics, constip> prev on Protonix40, now on Zantac150, Miralax; continue same meds.[name]    DJD/ LBP> on OTC analgesics prn & osteobiflex prn; had right THR 2011; known sp stenosis w/ prev ESI...    Anxiety, mild senile dementia, difficult interaction betw pt & daugh> on Xanax0.25Bid & Zoloft50 Qhs EXAM reveals Afeb, VSS, O2sat=93% on RA;  Wt= up 12# to 189#;  HEENT- neg, no thyroid nodule palp;  Chest- clear w/o w/r/r;  Heart- RR gr1/6 SEM no r/g;  Abd- soft, non-tender,  neg;  Ext- VI, w/ 1+ edema, no c/c;  Neuro- gait abn otherw neg.  LABS 12/04/16>  BMet- ok w/ HCO3=34, K=4.0, Cr=1.27...  IMP/PLAN>>  OK 2017 Flu shot today;  She is reminded to eliminate sodium/ salt from her diet, increase Demadex20Qam & take the Diamox250 Qd at 4PM;  She will continue the daily exercises at [facility] & call for any problems...   ~  May 06, 2017:  72moROV & general medical f/u visit>  [name] appears stable overall, she continues to thrive at [place]arveyher assistant rosa had knee surg & will not be returning; family notes some dental issues w/ 3 teeth removed by oral surg & partial plate being made; pt ambulates w/ a walker... We reviewed the following medical problems during today's office visit >>     Chr Obstructive Asthma> her dyspnea is more from anxiety/panic & not asthma; on Advair100Bid ([facility] requires regular Rx directions) & Proventil rescue prn; notes that Alpraz0.25Bid helps dyspnea...    HBP> on ASA81, Norvasc5, Demadex20-1/2, Diamox250; BP=120/62, tol meds well; denies CP, palpit, ch in SOB, but incr edema noted recently=> needs low sodium diet, incr Demadex20 & change Diamox to 4PM daily...    CHOL> on diet alone, refuses meds, last FLP 3/14 shows TChol 177, TG 50, HDL 60, LDL 107    DM> on diet alone, wt down to 182#, BS in epic=99-150, last A1c (2/13) was 6.3 & she knows to restrict carbs etc...    Thyrotoxicosis> on Tapazole526m1/2 tab daily per DrGherghe; f/u TFTs 5/18 showed TSH=2.55 and FreeT4=0.75, she is clinically improved & stable- continue same...    GI- Reflux, Divertics, constip> prev on Protonix40, now on Zantac150, Miralax; continue same meds.. [name]  DJD/ LBP> on OTC analgesics (Tylenol) prn & osteobiflex prn; had right THR 2011; known sp stenosis w/ prev ESI...    Anxiety, mild senile dementia, difficult interaction betw pt & daugh> on Xanax0.25Bid & Zoloft50 Qhs EXAM reveals Afeb, VSS, O2sat=93% on RA;  Wt= down 7# to 182#;  HEENT- neg, no  thyroid nodule palp;  Chest- clear w/o w/r/r;  Heart- RR gr1/6 SEM no r/g;  Abd- soft, non-tender, neg;  Ext- VI, w/ 1+ edema, no c/c;  Neuro- gait abn otherw neg.  LABS 05/06/17>  Chems- ok w/ K=3.9, BS=99, Cr=1.11, LFTs wnl;  CBC- ok w/ Hg=13.4;  VitD=62... NOTE- TSH in May was 2.55... IMP/PLAN>>  [name] is stable- rec to continue diet/ exercise/ participation at [facility]; we reviewed meds/ treatments/ etc- continue same...  ~  November 07, 2017:  563moROV & Rosea continues to do well at [employer] the help of her aSwede Heaven and her daughter SEnid [name].. We reviewed these interval Epic notes/ medical visits>     She saw PULM- TParrett,NP on 08/23/17>   Legs red & swollen, dx w/ cellulitis on top of her VI/ edema; treated w/ Doxy, elevation, local care and incr Torsemide to 263mQam...    She fell 08/27/17 at [facility] w/ left shoulder trauma & left humerus fracture>  Hosp x 3d by [facility] w/ conservative management per Ortho using sling & Tramadol, she followed up w/ DrSupple & is improved...     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    HBP> on ASA81, Norvasc5, Demadex20; BP=130/64, tol meds well; denies CP, palpit, ch in SOB=> needs low sodium diet, continue same meds...    CHOL> on diet alone, refuses meds, last FLP 3/14 shows TChol 177, TG 50, HDL 60, LDL 107    DM> on diet alone, wt down to 177#, BS in epic=125-164, last A1c (6/17) was 6.3 & she knows to restrict carbs etc...     Thyrotoxicosis> on Tapazole63mg-184mtab daily per DrGherghe; f/u TFTs 12/18 showed TSH=3.39 and FreeT4=0.90, she is clinically improved & stable- continue same...    GI- Reflux, Divertics, constip> prev on Protonix40, now on Zantac150, Miralax; continue same meds..    DMarland KitchenD/ LBP> on OTC analgesics (Tylenol) prn & osteobiflex prn; had right THR 2011; known sp stenosis w/ prev ESI...    Anxiety, mild senile dementia, difficult interaction betw pt & daugh> on Xanax0.25- 1/2Bid & Zoloft50 Qhs EXAM reveals Afeb, VSS, O2sat=89% on RA;  Wt= down 5# to 177#;  HEENT- neg, no thyroid nodule palp;  Chest- clear w/o w/r/r;  Heart- RR gr1/6 SEM no r/g;  Abd- soft, non-tender, neg;  Ext- VI, w/ 1+ edema, no c/c;  Neuro- gait abn otherw neg.  CXR 08/27/17 showed heart at upper lim of norm for size & calcif in aorta, lungs mildly hypoinflated, chr elev of right hemidiaph, no CHF or pneumonia, left humeral head & neck fx, intact clavicle  LABS 08/2017 in Hosp>  Chems- ok w/ BS=125-164, Cr~1.1;  CBC w/ Hg ~11, WBC=8K;  TFTs- ok on the Methimazole IMP/PLAN>>  [name] is stable on her current meds- Left shoulder improving w/ conservative management, breathing is at baseline, OK Flu shot today...   ~  ADDENDUM 01/15/18 >> [facility] sent over Urine C&S from 01/10/18 growing >100K Enterobacter sens to cipro/levaquin but pt is intol; also  sens to Sulfa & we will call in SeptraDS #20 one bid x10d til gone...   ~  May 12, 2018:  731moROV & [name] (now 84y/o) [facility], sundowning, & behavioral issues (w/ paranoia) that have plagued [facility] & her daughter, SEnid [name]  They started Depakote250Qhs & rec memory care unit but daughter refused the latter; MMSE was 11/30;  They felt that prev Xanax 0.25Bid caused incr confusion & drowsiness;  Tried Melatonin up to 660mQhs but no benefit;  Also c/o burning in her feet c/w neuropathy symptoms;  DrJaffe wanted to avoid Seroquel,  they started low dose Gabapentin 10010mhs, continue Sertraline25Qhs, continue Melatonin6mg10m, avoid Xanax- DC this, check labs- B12=725, CBC- wnl, Chems- wnl w/ Cr=1.14, TFTs wnl w/ TSH borderline at 4.86... We reviewed the following medical problems during today's office visit>  Unfortunately the pt has continued behavioral issues, insomnia, & anxiety...    Chr Obstructive Asthma> her dyspnea is more from anxiety/panic & not asthma; on Advair100Bid ([facility] requires regular Rx directions) & DUONEB vs Proventil rescue prn; notes that Alpraz0.25- 1/2Bid helps dyspnea, but this was stopped by Neuro.    HBP> on ASA81, Norvasc5, Demadex20; BP=134/62, tol meds well; denies CP, palpit, ch in SOB=> needs low sodium diet, continue same meds...    CHOL> on diet alone, refuses meds, last FLP 3/14 shows TChol 177, TG 50, HDL 60, LDL 107    DM> on diet alone, wt down to 172#, BS in epic<100, last A1c (6/17) was 6.3 & she knows to restrict carbs etc...    Thyrotoxicosis> on Tapazole5mg-35m tab daily per DrGherghe; f/u TFTs 12/18 showed TSH=3.39 and FreeT4=0.90, she is clinically improved & stable- Labs 03/2018 showed TSH=4.86, FreeT3=3.0, FreeT4=1.12 & DrGherghe stopped the Tapazole rx 04/2018...    GI- Reflux, Divertics, constip> prev on Protonix40, now on Zantac150, Miralax; continue same meds..    Marland KitchenJD/ LBP> on OTC analgesics (Tylenol) prn & osteobiflex prn; had right THR 2011; known sp stenosis w/ prev ESI...    Anxiety, senile dementia w/ behavioral abn, difficult interaction betw pt & daugh> off Xanax per Neuro & Zoloft50 Qhs EXAM reveals Afeb, VSS, O2sat=92% on RA;  Wt= down 5# to 172#;  HEENT- neg, no thyroid nodule palp;  Chest- clear w/o w/r/r;  Heart- RR gr1/6 SEM no r/g;  Abd- soft, non-tender, neg;  Ext- VI, w/ 1+ edema, no c/c;  Neuro- gait abn otherw no changes... IMP/PLAN>>  Difficult issues and interaction betw daughter, Pt, & [facility]- for now daugh wants meds crushed & placed in apple sauce  to administer;  betw the med changes requested by [facility], daughter, & Neuro it is hard to keep up- I have suggested considering [facility] as they are there very often & can clinically assess the pt;  Daughter however prefers to transition primary care to [place]Tabori [place]  October 13, 2018:  31mo R74mo pulm/medical follow up visit>              Problem List:       DYSPNEA (ICD-786.05) - long hx of chronic obstructive asthma treated w/ ADVAIR100Bid & PROAIR (she uses them Prn now)... she is a non-smoker w/ some reactive airways disease in the past & retired from [employer] years in 1991... she denies cough, sputum, hemoptysis, worsening dyspnea, wheezing, chest pains, snoring, daytime hypersomnolence,  etc...  ~  baseline CXR w/o acute changes...  ~  PFT's 5/02 w/ FVC 2.07 (68%), FEV1=1.36 (57%), and FEV1/FVC ratio=66%, mid-flows 42%... ~  CT Angio 7/10 was neg- x biapical pleuroparenchymal scarring... ~  CXR 10/11 showed sl elev right hemidaiph, mild DJD sp, osteopenia, NAD.[name]. ~  CXR 6/12 showed mild apical scarring, clear & NAD, DJD sp w/ osteophytes... ~  Intermittent dyspnea more related to anxiety & treated w/ KLONOPIN 0.67m 1/2 to 1 tab Bid==> improved. ~  CXR 4/13 showed normal heart size, clear lungs, DJD in TSpine... ~  CXR 2/14 showed normal heart size, clear lungs w/ sl peribronch thickening, DJD in spine, NAD..[name] ~  CXR 3/15 showed norm heart size, clear lungs, elev of right hemidiaph, NAD... ~  She is encouraged to take the Klonopin 0.529mBid regularly- consider taking 1/2 in AM, 1/2 in afternoon, one at bedtime... ~  1/16: she continues w/ mult somatic complaints and intermittent choking episodes etc; she refuses to take the Advair or Klonopin... ~  CXR 1/16 showed norm heart size, clear lungs, mild right diaph eventration- no change, Tspine DJD. DISH/ osteopenia; NAD... ~  3/16: she is stable on current meds,  asked to incr her non-existent exercise program... ~  4/16: went to ER w/ SOB- nothing found x mild edema; given Lasix40, potassium was low, thought to be anxious; in office f/u her daugh confirms anxiety & pt not taking her meds regularly; asked to take meds every day & try the Klonopin... ~  7/16: same thing- went to ER w/ dyspnea, nothing found & given Ativan but she wouldn't take it... ~  8/16: another episode w/ ER eval and symptoms resolved spontaneously on there own... ~  9/16: pt is advised (again) to start regular dosing of Alpraz 0.68m44m 1/2 tab Tid w/ extra 1/2 tab as needed for nerves... ~  12/16: she remains on Advair100-2spBid & Alpraz0.268m168m=> improved... ~  3/17: dyspnea further improved w/ Tapazole68mg/47mx for hyperthyroidism... ~  8/17:  She is stable on meds at [facility]- Advair100Bid, Albut prn, diuretics, Alprazolam, Tapazole, etc...  HYPERTENSION (ICD-401.9) - controlled on NORVASC 68mg d57my & HCTZ 268mgta2mily...  ~  2/13:  BP 144/70 today> tol rx well & denies HA, visual changes, CP, palipit, dizziness, syncope, edema, etc... ~  4/13:  BP= 148/80 & she denies CP, palpit, edema; dyspnea improved w/ Klonopin. ~  6/13:  BP= 132/68 & as noted she has mult somatic complaints... ~  3/14:  on Norvasc10, HCTZ25; BP=136/60, tol meds well; denies CP, palpit, ch in SOB, edema, etc  ~  9/14:  BP controlled on Amlod5 & Hct25-1/2 daily; BP= 130/70 & she denies CP, palpit, dizzy, SOB, edema, etc. ~  3/15: on ASA81, Norvasc5, Lasix20- 1-2/d; BP=136/70, tol meds well; denies CP, palpit, ch in SOB, but notes persist edema in legs; Rec to ch Amlod5 to Losar50, low sodium, elev legs, Lasix40. ~  5/15: on Losar50, Lasix40;  BP= 138/60 & she denies angina pain, palpit, ch in SOB, etc... ~  7/15: on Losar50, she stopped Lasix; BP= 138/78 and they want to go back on Amlod5 + HCT12.5 daily... ~  9/15: on Amlod5, Hct12.5; BP= 128/80 & she likes this combo better- denies CP, palpit, etc... ~   1/16: on Amlod5, Hct12.5 but ?what she is taking; BP= 130/80 & she has refused the ARBs... ~  3/16: on Amlod5, Hct12.5; BP= 142/80 & she has gained 9# w/ 1-2+ edema; discussed low sodium &  change Hct to LASIX20 Qam... ~  5/16: on Amlod5, Lasix20, K20; BP=148/70, recent K=3.0 & daugh notes she's not taking meds regularly; asked to take meds everyday & incr K20Bid... ~  2/17: BP is controlled on Amlod5, Demadex20, K10Bid; BP= 116/58 & she denies CP, palpit, ch in edema, etc... ~  8/17: BP is controlled on Amlod5 + Demadex20-1/2 & diamox250/d;  BP= 128/70 7 rec to continue same...  RIGHT BUNDLE BRANCH BLOCK (ICD-426.4) - on ASA 57m/d... baseline EKG w/ RBBB and 2DEcho 5/02 showed mild asymmetric LVH w/ incr EF... ~  12/15: she had Cards eval by DrNishan> he rec ARB/diuretic but she refused to switch from her Amlod5/ Hct12.5 regimen; she refused EKG due to "allergy to the electrodes"... ~  2DEcho 1/16 showed norm LV size & function w/ EF=65-70%, AoV leaflets mildly thickened w/o AS, MV leaflets mod thickened w/ trivMR, mild RA dil, PAsys=390mg... ~  EKG 4/16 showed NSR, rate72, PACs, LAD, RBBB  VENOUS INSUFFIC & EDEMA >>  ~  3/15: she was switched off Amlod & onto Losar50, Lasix20=>40, plus no salt, elevation, support hose, etc...  ~  9/15: [facility]; she has VI & 1+edema but stable, no acute changes... 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 ~  3/16: she remains on the Pred from Derm, now w/ incr edema & 9# wt gain, rec no salt & change Hct to LASIX20/d... ~  5/16: improved on Lasix20 w/ edema decr 7 wt down several lbs... ~  12/16: on ASA81, Norvasc5, off Lasix20, K10-2/d; BP=140/76, tol meds well; denies CP, palpit, ch in SOB, tr edema. ~  3/17: edema increased w/ decr in diuretics and [facility] to incr Demadex20/d + Diamox250/d and K10Bid...  HYPERLIPIDEMIA (ICD-272.4) - on diet alone...  she forgets to come to visits FASTING for this blood work ~  FLChemun[place]/07 showed TChol 205, TG 71, HDL 49, LDL 130... ~  FLP 6/12 on diet alone showed TChol 218, TG 50, HDL 66, LDL 129 ~  FLP 2/13 on diet alone showed TChol 171, TG 43, HDL 66, LDL 97 ~  FLP 3/14 on diet alone showed TChol 177, TG 50, HDL 60, LDL 107 ~  She needs to ret FASTING for f/u FLP...  DIABETES MELLITUS, BORDERLINE (ICD-790.29) - on diet alone w/ prev BS's in the 100-160 range... ~  labs in 2008-9 showed BS= 101 to 108 ~  labs 1/10 showed BS= 106, A1c= 5.9 ~  Labs 6/12 showed BS= 93, A1c= 6.6...Marland KitchenMarland Kitchenec diet, exercise... ~  Labs 2/13 showed BS= 92, A1c= 6.3 ~  3/14: on diet alone, wt stable ~174#, BS=97, last A1c (2/13) was 6.3 & she knows to restrict carbs etc. ~  Labs 3/15 showed BS= 95; and BS= 120 in [id number].  ~  Labs 2016 showed BS= 101-135 on diet alone... ~  Labs in 2017 showed BS= 100-150 range on diet alone  THYROTOXICOSIS >> routine labs 11/2015 showed thyrotoxicosis & referred to Endocrine, DrGherghe- Rx w/ Tapazole5m24m & she is clinically improved... ~  Labs 2/13 showed TSH= 2.86 ~  Labs 3/14 showed TSH=1.93 ~  Labs 1/17 showed TSH=0.03-0.09;  FreeT3=4.0;  FreeT4=1.70 => referred to DrGherghe, started on Tapazole5 ~  Labs 2/17 showed TSH=2.93;  FreeT3=2.5;  FreeT4=0.76 ~  She continues to f/u w/ drGherghe for thyroid...  INDIGESTION/ REFLUX SYMPTOMS >> see 10/12 note & PROTONIX 37m16mstarted,  further eval if symptoms persist... ESOPHAGEAL DYSMOTILITY/ PRESBYESOPHAGUS >>  ~  4/13:  She notes some reflux symptoms and excess gas w/ belching; rec to take the Protonix daily & Simethacone vs Tums which she says helps her gas. ~  5/13:  She saw GI DrPerry w/ rec to take Prilosec for her indigestion... ~  5/15:  She presented w/ worsening indigestion, dysphagia, reflux & Prev30 was incr to Bid w/ GI f/u suggested for EGD... 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 ~  9/15:  Symptoms persist but she is not regurg or vomiting, and weight stable; they want to change to Nexium40Bid-OK, and rec proceed w/ MBS by speech path... ~  She continues to have intermit choking episodes and c/o phlegm in her throat; she has tried "everything" & encouraged to f/u w/ GI- DrPerry/ DrJEdwards... ~  She stopped the PPI rx in favor of Zantac150...  DIVERTICULOSIS OF COLON (ICD-562.10) - she takes SENAKOT-S, MIRALAX, Peppermint Tea, & sauerkraut Prn...last colonoscopy 9/02 by DrPerry was WNL...  PYELONEPHRITIS (ICD-590.80) - SEE 1/09 Hospitalization (reviewed)... ~  She saw DrMacDiarmid for her recurrent UTIs, chronic cystitis, urge & stress incont, nocturia; she is INTOL to [place]...  Hx of BREAST CYST (ICD-610.0)  DEGENERATIVE JOINT DISEASE (ICD-715.90) - s/p right hip hemiarthroplasty 11/09 by DrAplington w/ wound complic... then dx w/ loosening of the femoral shaft & had conversion to right THR by DrAlusio 10/11 & much improved... she uses CELEBREX 234m Prn (seldom takes this).  LOW BACK PAIN SYNDROME (ICD-724.2) & SPINAL STENOSIS (ICD-724.00) - severe LBP & spinal stenosis w/ evals by DrRamos & DrNudelman... s/p shots, considering poss surgery vs alternative therapies... she takes Celebrex, Osteobiflex, MVI, Vit D... ~  8/10: eval by DrAplington- diff leg lengths, lift placed in right shoe, then trial Lyrica566m.. ~  12/11:  improved after hip revision surg (to THR) 10/11 w/ better ambulaton... ~  5/13:  DrRamos gave her another ESI for her leg pain related to sp stenosis... ~  3/14:  C/o neuropathic discomfort in legs- eval by Ramos & offered shots in her back; try Lyrica50 in the interim... ~  Persistent leg  pain, on OTC "natural" meds she says; encouraged to f/u w/ DrRamos et al...  Hx of ANEMIA (ICD-285.9) - eval by GI in 2002 showed normal EGD and Colon... prob iron malabsorption problem Rx'd w/ Fe infusion... ~  labs 1/10 showed Hg= 14.6, MCV= 89, Fe= 94 ~  labs 12/11 showed Hg= 12.8, MCV= 90, Fe= 33... try Fe supplement + VitC... ~  Labs 6/12 showed Hg= 15.0 ~  Labs 2/13 showed Hg= 14.6 ~  Labs 2/14 showed Hg= 14.8 ~  Labs 3/15 showed Hg= 14.9 ~  Labs 1/16 showed Hg= 14.4 ~  Labs 4/16 showed Hg= 13.7 ~  Labs 2/17 showed Hg= 14.1  DERM:  rash Rx'd by dermatology- OLUX-E foam= clobetasol Foam 0.05%... 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 ~  5/16: she is still on Pred per Derm- currently 1055m...    Past Surgical History:  Procedure Laterality Date  . CATARACT EXTRACTION    .  conversion to right THR    . right hip hemiarthroplasty     total    Outpatient Encounter Medications as of 10/13/2018  Medication Sig  . ADVAIR DISKUS 100-50 MCG/DOSE AEPB INHALE 1 PUFF BY MOUTH TWICE DAILY. RINSE MOUTH AFTER USE TO PREVENT THRUSH.  [name] albuterol (PROAIR HFA) 108 (90 Base) MCG/ACT inhaler USE 1 TO 2 PUFFS EVERY SIX HOURS AS NEEDED. (Patient taking differently: Inhale 1-2 puffs into the lungs every 6 (six) hours as needed for wheezing or shortness of breath. USE 1 TO 2 PUFFS EVERY SIX HOURS AS NEEDED.)  . amLODipine (NORVASC) 5 MG tablet TAKE ONE TABLET BY MOUTH ONCE DAILY.  [name] ASPIRIN LOW DOSE 81 MG EC tablet TAKE ONE TABLET BY MOUTH ONCE DAILY.  . cetirizine (ZYRTEC) 10 MG tablet TAKE 1 TABLET BY MOUTH ONCE DAILY FOR ALLERGIES.  [name] Cholecalciferol (VITAMIN D3) 2000 units capsule TAKE (1) CAPSULE BY MOUTH ONCE DAILY.  . divalproex (DEPAKOTE SPRINKLE) 125 MG capsule OPEN (2) CAPSULES (250MG) AND SPRINKLE ON APPLESAUCE AT BEDTIME.  . fluticasone (FLONASE) 50 MCG/ACT nasal spray SPRAY 2 SPRAYS INTO EACH NOSTRIL ONCE DAILY.  [name]  gabapentin (NEURONTIN) 100 MG capsule TAKE (2) CAPSULES BY MOUTH EACH MORNING. TAKE (6) CAPSULES BY MOUTH AT BEDTIME.  . LORazepam (ATIVAN) 0.5 MG tablet TAKE (1/2) TABLET BY MOUTH TWICE DAILY.(11:30AM & 4:30PM)  . MAPAP 500 MG tablet TAKE 2 TABLETS (650MG) BY MOUTH EVERY 4 HOURS AS NEEDED FOR MILD PAIN.  . Melatonin 3 MG SUBL Place 6 mg under the tongue daily.  . Multiple Vitamin (DAILY-VITE) TABS TAKE ONE TABLET BY MOUTH ONCE DAILY.  . ranitidine (ZANTAC) 150 MG tablet TAKE (1) TABLET BY MOUTH ONCE DAILY.  [name] sertraline (ZOLOFT) 50 MG tablet TAKE (1) TABLET BY MOUTH ONCE DAILY.  [name] Spacer/Aero-Holding Chambers (AEROCHAMBER PLUS WITH MASK) inhaler Use as instructed  . sulfamethoxazole-trimethoprim (BACTRIM DS) 800-160 MG tablet Take 1 tablet by mouth 2 (two) times daily.  [name] torsemide (DEMADEX) 20 MG tablet TAKE 20 MG BY MOUTH EVERY MORNING.   No facility-administered encounter medications on file as of 10/13/2018.     Allergies  Allergen Reactions  . Azithromycin Shortness Of Breath  . Ciprofloxacin Other (See Comments)     hallucinations  . Levofloxacin Other (See Comments)    Insomnia, indigestion, tingling sensation in legs  . Furosemide Rash    Bullous pemphigoid  . Latex Rash  . Other Rash    EKG leads caused a rash that required steroids to clear    Immunization History  Administered Date(s) Administered  . Influenza Split 09/12/2011, 09/16/2012  . Influenza Whole 09/18/2006, 08/11/2008, 12/12/2009  . Influenza, High Dose Seasonal PF 12/04/2016, 11/07/2017  . Influenza,inj,Quad PF,6+ Mos 08/17/2013, 07/30/2014, 08/09/2015, 09/11/2018  . PPD Test 08/09/2015  . Pneumococcal Conjugate-13 08/09/2015  . Tdap 05/14/2011     Current Medications, Allergies, Past Medical History, Past Surgical History, Family History, and Social History were reviewed in [employer] record.    Review of Systems         See HPI - all other systems neg except as  noted... The patient complains of decreased hearing, dyspnea on exertion, muscle weakness, and difficulty walking.  The patient denies anorexia, fever, weight loss, weight gain, vision loss, hoarseness, chest pain, syncope, peripheral edema, prolonged cough, headaches, hemoptysis, abdominal pain, melena, hematochezia, severe indigestion/heartburn, hematuria, incontinence, suspicious skin lesions, transient blindness, depression, unusual weight change, abnormal bleeding, enlarged lymph nodes, and angioedema.  Objective:   Physical Exam     WD, WN, Chr ill appearing 84 y/o WF in NAD... GENERAL:  Alert & oriented; pleasant & cooperative... HEENT:  [place]/AT, EOM-full, EACs-clear, TMs-wnl, NOSE-clear, THROAT-clear & wnl. NECK:  Supple w/ fairROM; no JVD; normal carotid impulses w/o bruits; no thyromegaly or nodules palpated; no lymphadenopathy. CHEST:  Clear to P & A; without wheezes/ rales/ or rhonchi heard... HEART:  Regular Rhythm; without murmurs/ rubs/ or gallops detected... ABDOMEN:  Soft & nontender; normal bowel sounds; no organomegaly or masses palpated... EXT:  mod arthritic changes, walks w/ cane, +venous insuffic & incr 1-2+ edema., scattered varicose veins... NEURO:  CN's intact; motor testing normal; no focal deficits... DERM:   mild intertrig rash under breast, & onychomycosis of toenails...  RADIOLOGY DATA:  Reviewed in the EPIC EMR & discussed w/ the patient...  LABORATORY DATA:  Reviewed in the EPIC EMR & discussed w/ the patient...   Assessment & Plan:    05/06/17>  [name] is stable- rec to continue diet/ exercise/ participation at [facility]; we reviewed mewds/ treatments/ etc- continue same. 11/07/17>  [name] is stable on her current meds- Left shoulder improving w/ conservative management, breathing is at baseline, OK Flu shot today. 6.24.19>   Difficult issues and interaction betw daughter, Pt, & [facility]- for now daugh wants meds crushed & placed in apple sauce to  administer;  betw the med changes requested by [facility], daughter, & Neuro it is hard to keep up- I have suggested considering transition of care to the [place] physician staff as they are there very often & can clinically assess the pt;  Daughter however prefers to transition primary care to DrTabori in [place]   Hx episodes of SOB>> see above, she has been resistent to trying any of the meds & treatment suggestions that we have given to her (I know she would improve on Benzo rx)...  12/16> symptoms are better w/ Advair100-2spBid & Alprazolam 0.51mBid admin regularly at [facility]... 12/29/15> we checked the above labs, reminded to use Tylenol prn, no salt, elevate, etc; we decided to decr the Demadex to 1/2 tab Qam & ADD [id number] tab in the afternoon; she is reassured about her health & asked to f/u w/ DrGherghe for Endocrine re thyroid... 07/16/16>   She is stable w/o recurrent dyspnea spells  Hx Asthma> stable on Advair100 & Proair prn; hx anxiety component on Klonopin in past but she is not using! Now on Alpraz & improved.  HBP>  Controlled on Amlod5 + Demadex + Diamox, & off KCl; reminded to take meds regularly & no salt...  RBBB>  Aware & denies CP, palpit, ch in DOE, etc... she thinks that she is allergic to EKG electrodes; EKG 4./16 in ER showed NSR, rate72, PACs, LAD, RBBB  CHOL>  On diet alone & FLP looks reasonable;  We reviewed low chol, low fat diet...  Borderline DM>  BS= 101-135 & last A1c is 6.3;  on diet alone & we reviewed low carb no sweets etc...  Thyrotoxicosis>  Improved on Tapazole5 per DrGherghe => down to 1/2 tab daily...  GI> Indigestion, Divertics> she notes most bowel symptoms resolved off spicey foods;  UGI symptoms w/ dysphagia, food sticking, belch/gas/ etc have not resolved on Protonix40Bid and after GI consult w/ DrPerry; we reviewed her UGI series results and decided to proceed w/ MBS by Speech Path; transiently improved then sought 2nd opinion  from DrJEdwards=> on Nexium40Bid...  UTI>  Klebsiella UTI resolved  after Septra Rx... She has been eval by DrMacDiarmid.  DJD, LBP, Spinal Stenosis>  Prev evals by Ortho, DrRamos, DrNudelman etc; improved after THR w/ better ambulation; c/o neuropathic discomfort in legs- she will f/u w/ Ramos for shots, using OTC "natural" meds...  Anxiety>  If she would take the Alpraz, I feel it would help...   Patient's Medications  New Prescriptions   No medications on file  Previous Medications   ADVAIR DISKUS 100-50 MCG/DOSE AEPB    INHALE 1 PUFF BY MOUTH TWICE DAILY. RINSE MOUTH AFTER USE TO PREVENT THRUSH.   ALBUTEROL (PROAIR HFA) 108 (90 BASE) MCG/ACT INHALER    USE 1 TO 2 PUFFS EVERY SIX HOURS AS NEEDED.   AMLODIPINE (NORVASC) 5 MG TABLET    TAKE ONE TABLET BY MOUTH ONCE DAILY.   ASPIRIN LOW DOSE 81 MG EC TABLET    TAKE ONE TABLET BY MOUTH ONCE DAILY.   CETIRIZINE (ZYRTEC) 10 MG TABLET    TAKE 1 TABLET BY MOUTH ONCE DAILY FOR ALLERGIES.   CHOLECALCIFEROL (VITAMIN D3) 2000 UNITS CAPSULE    TAKE (1) CAPSULE BY MOUTH ONCE DAILY.   DIVALPROEX (DEPAKOTE SPRINKLE) 125 MG CAPSULE    OPEN (2) CAPSULES (250MG) AND SPRINKLE ON APPLESAUCE AT BEDTIME.   FLUTICASONE (FLONASE) 50 MCG/ACT NASAL SPRAY    SPRAY 2 SPRAYS INTO EACH NOSTRIL ONCE DAILY.   GABAPENTIN (NEURONTIN) 100 MG CAPSULE    TAKE (2) CAPSULES BY MOUTH EACH MORNING. TAKE (6) CAPSULES BY MOUTH AT BEDTIME.   LORAZEPAM (ATIVAN) 0.5 MG TABLET    TAKE (1/2) TABLET BY MOUTH TWICE DAILY.(11:30AM & 4:30PM)   MAPAP 500 MG TABLET    TAKE 2 TABLETS (650MG) BY MOUTH EVERY 4 HOURS AS NEEDED FOR MILD PAIN.   MELATONIN 3 MG SUBL    Place 6 mg under the tongue daily.   MULTIPLE VITAMIN (DAILY-VITE) TABS    TAKE ONE TABLET BY MOUTH ONCE DAILY.   RANITIDINE (ZANTAC) 150 MG TABLET    TAKE (1) TABLET BY MOUTH ONCE DAILY.   SERTRALINE (ZOLOFT) 50 MG TABLET    TAKE (1) TABLET BY MOUTH ONCE DAILY.   SPACER/AERO-HOLDING CHAMBERS (AEROCHAMBER PLUS WITH MASK) INHALER     Use as instructed   SULFAMETHOXAZOLE-TRIMETHOPRIM (BACTRIM DS) 800-160 MG TABLET    Take 1 tablet by mouth 2 (two) times daily.   TORSEMIDE (DEMADEX) 20 MG TABLET    TAKE 20 MG BY MOUTH EVERY MORNING.  Modified Medications   No medications on file  Discontinued Medications   No medications on file

## 2018-10-13 NOTE — Patient Instructions (Signed)
Today we updated your med list in our EPIC system...    Continue your current medications the same...  You are already established w/ DrCAndy in [place]...  [name],  It has been my honor to have been of service to you & your family over the years...    My best wishes for good health & much happiness in the years to come!!!

## 2018-10-20 ENCOUNTER — Ambulatory Visit: Payer: Medicare Other | Admitting: Internal Medicine

## 2018-10-29 ENCOUNTER — Other Ambulatory Visit: Payer: Self-pay | Admitting: Pulmonary Disease

## 2018-11-07 ENCOUNTER — Telehealth: Payer: Self-pay | Admitting: Emergency Medicine

## 2018-11-07 ENCOUNTER — Ambulatory Visit (INDEPENDENT_AMBULATORY_CARE_PROVIDER_SITE_OTHER): Payer: Medicare Other | Admitting: Family Medicine

## 2018-11-07 ENCOUNTER — Telehealth: Payer: Self-pay | Admitting: Neurology

## 2018-11-07 ENCOUNTER — Telehealth: Payer: Self-pay | Admitting: *Deleted

## 2018-11-07 ENCOUNTER — Other Ambulatory Visit: Payer: Self-pay

## 2018-11-07 ENCOUNTER — Telehealth: Payer: Self-pay | Admitting: Family Medicine

## 2018-11-07 ENCOUNTER — Encounter: Payer: Self-pay | Admitting: Family Medicine

## 2018-11-07 VITALS — BP 114/72 | HR 108 | Temp 97.9°F | Resp 18 | Ht 67.0 in | Wt 176.0 lb

## 2018-11-07 DIAGNOSIS — R300 Dysuria: Secondary | ICD-10-CM | POA: Diagnosis not present

## 2018-11-07 DIAGNOSIS — N3090 Cystitis, unspecified without hematuria: Secondary | ICD-10-CM

## 2018-11-07 LAB — POCT URINALYSIS DIPSTICK
Blood, UA: NEGATIVE
Glucose, UA: NEGATIVE
KETONES UA: NEGATIVE
Nitrite, UA: NEGATIVE
Odor: POSITIVE
Protein, UA: POSITIVE — AB
SPEC GRAV UA: 1.025 (ref 1.010–1.025)
Urobilinogen, UA: 1 E.U./dL
pH, UA: 5 (ref 5.0–8.0)

## 2018-11-07 MED ORDER — CEFTRIAXONE SODIUM 1 G IJ SOLR
1.0000 g | Freq: Once | INTRAMUSCULAR | Status: AC
  Administered 2018-11-07: 1 g via INTRAMUSCULAR

## 2018-11-07 MED ORDER — SULFAMETHOXAZOLE-TRIMETHOPRIM 800-160 MG PO TABS: 1.0000 | ORAL_TABLET | Freq: Two times a day (BID) | ORAL | 0 refills | Status: DC

## 2018-11-07 NOTE — Telephone Encounter (Signed)
Is mom having any symptoms: any dysuria, altered mental status, pain, frequency.  If urine is dark without sxs, increase fluid intake.  If symptoms, needs UA and evaluation.  Can residence perform the tests?? If not, needs f/u with me next week or urgent care visit.

## 2018-11-07 NOTE — Telephone Encounter (Signed)
error 

## 2018-11-07 NOTE — Progress Notes (Signed)
Subjective   CC:  Chief Complaint  Patient presents with  . Dysuria    Dark yellow, odor, frequency, weakness, fatigue, had a fall earlier.. Noticed 2 weeks ago got worse this week    HPI: [name] is a 84 y.o. female who presents to the office today to address the problems listed above in the chief complaint.  Patient brought in by her daughter: she reports over the last few weeks, mom has worsened. Has c/o sob thought to be related to a change in her advair. But over last 48 hours decreased appetite, sleepier than usual and getting weaker: has had 2 falls. No fevers; this am, nurse at assisted living noted strong smelling dark urine. Pt denies pelvic pain or dysuria. She has had 2 E.coli UTI's this year, most recently in November treated with septra. No vomiting, diarrhea, chest pain or cough.   Assessment  1. Recurrent cystitis   2. Dysuria      Plan   Recurrent cystitis: treat with rocephin 1gm x 1 in office, then septra DS bid x 7 days. Monitor for improvement over next 48 hours; if worsens needs reeval. Send urine for culture. Push fluids. Daughter understands and agrees with care plan.   Follow up: Return if symptoms worsen or fail to improve.  Orders Placed This Encounter  Procedures  . Urine Culture  . POCT urinalysis dipstick   Meds ordered this encounter  Medications  . sulfamethoxazole-trimethoprim (BACTRIM DS,SEPTRA DS) 800-160 MG tablet    Sig: Take 1 tablet by mouth 2 (two) times daily.    Dispense:  14 tablet    Refill:  0  . cefTRIAXone (ROCEPHIN) injection 1 g      I reviewed the patients updated PMH, FH, and SocHx.    Patient Active Problem List   Diagnosis Date Noted  . Dementia with behavioral disturbance (HCC) 05/12/2018  . Idiopathic peripheral neuropathy 05/12/2018  . Dysphagia 05/09/2016  . Esophageal dysmotility 05/09/2016  . CKD (chronic kidney disease), stage III (HCC) 04/23/2016  . Allergic rhinitis 01/11/2016  . Thyrotoxicosis  12/07/2015  . Insomnia 2020-08-716  . COPD (chronic obstructive pulmonary disease) (HCC) 11/17/2014  . Chronic venous insufficiency 04/14/2014  . Abnormality of gait 08/17/2013  . GERD (gastroesophageal reflux disease) 09/12/2011  . Anxiety 05/14/2011  . Bullous pemphigoid 12/13/2008  . Diverticulosis of large intestine 12/01/2007  . Constipation 12/01/2007  . Osteoarthritis 12/01/2007  . Spinal stenosis of lumbar region 10/28/2007  . Elevated lipids 10/27/2007  . Essential hypertension 10/27/2007  . RIGHT BUNDLE BRANCH BLOCK 10/27/2007  . LOW BACK PAIN SYNDROME 10/27/2007  . Impaired fasting glucose 10/27/2007   Current Meds  Medication Sig  . ADVAIR DISKUS 100-50 MCG/DOSE AEPB INHALE 1 PUFF BY MOUTH TWICE DAILY. RINSE MOUTH AFTER USE TO PREVENT THRUSH.  [name] albuterol (PROAIR HFA) 108 (90 Base) MCG/ACT inhaler USE 1 TO 2 PUFFS EVERY SIX HOURS AS NEEDED. (Patient taking differently: Inhale 1-2 puffs into the lungs every 6 (six) hours as needed for wheezing or shortness of breath. USE 1 TO 2 PUFFS EVERY SIX HOURS AS NEEDED.)  . amLODipine (NORVASC) 5 MG tablet TAKE ONE TABLET BY MOUTH ONCE DAILY.  [name] ASPIRIN LOW DOSE 81 MG EC tablet TAKE ONE TABLET BY MOUTH ONCE DAILY.  . cetirizine (ZYRTEC) 10 MG tablet TAKE 1 TABLET BY MOUTH ONCE DAILY FOR ALLERGIES.  [name] Cholecalciferol (VITAMIN D3) 2000 units capsule TAKE (1) CAPSULE BY MOUTH ONCE DAILY.  . divalproex (DEPAKOTE SPRINKLE) 125 MG  capsule OPEN (2) CAPSULES (250MG ) AND SPRINKLE ON APPLESAUCE AT BEDTIME.  . fluticasone (FLONASE) 50 MCG/ACT nasal spray SPRAY 2 SPRAYS INTO EACH NOSTRIL ONCE DAILY.  [name]. gabapentin (NEURONTIN) 100 MG capsule TAKE (2) CAPSULES BY MOUTH EACH MORNING. TAKE (6) CAPSULES BY MOUTH AT BEDTIME.  . LORazepam (ATIVAN) 0.5 MG tablet TAKE (1/2) TABLET BY MOUTH TWICE DAILY.(11:30AM & 4:30PM)  . MAPAP 500 MG tablet TAKE 2 TABLETS (650MG ) BY MOUTH EVERY 4 HOURS AS NEEDED FOR MILD PAIN.  . Melatonin 3 MG SUBL Place 6 mg under the  tongue daily.  . Multiple Vitamin (DAILY-VITE) TABS TAKE ONE TABLET BY MOUTH ONCE DAILY.  . ranitidine (ZANTAC) 150 MG tablet TAKE (1) TABLET BY MOUTH ONCE DAILY.  [name]. sertraline (ZOLOFT) 50 MG tablet TAKE (1) TABLET BY MOUTH ONCE DAILY.  [name]. Spacer/Aero-Holding Chambers (AEROCHAMBER PLUS WITH MASK) inhaler Use as instructed  . torsemide (DEMADEX) 20 MG tablet TAKE 20 MG BY MOUTH EVERY MORNING.  . [DISCONTINUED] sulfamethoxazole-trimethoprim (BACTRIM DS) 800-160 MG tablet Take 1 tablet by mouth 2 (two) times daily.    Review of Systems: Cardiovascular: negative for chest pain Respiratory: negative for SOB or persistent cough Gastrointestinal: negative for abdominal pain Constitutional: Negative for fever malaise or anorexia  Objective  Vitals: BP 114/72   Pulse (!) 108   Temp 97.9 F (36.6 C) (Oral)   Resp 18   Ht 5\' 7"  (1.702 m)   Wt 176 lb (79.8 kg)   BMI 27.57 kg/m  General: no acute distress , in wheel chair. Psych:  Alert and oriented x 3 but could fall asleep easily, normal mood and affect Cardiovascular:  RRR without murmur or gallop. no peripheral edema Respiratory:  Good breath sounds bilaterally, CTAB with normal respiratory effort Gastrointestinal: soft, flat abdomen, normal active bowel sounds, no palpable masses, no hepatosplenomegaly, no appreciated hernias, NO CVAT, no suprapubic ttp w/o rebound or guarding Skin:  Warm, no rashes Neurologic:   Mental status is normal. normal gait  Rocephin 1gm IM administered in office.  Office Visit on 11/07/2018  Component Date Value Ref Range Status  . Color, UA 11/07/2018 AMber   Final  . Clarity, UA 11/07/2018 Cloudy   Final  . Glucose, UA 11/07/2018 Negative  Negative Final  . Bilirubin, UA 11/07/2018 1+   Final  . Ketones, UA 11/07/2018 Negative   Final  . Spec Grav, UA 11/07/2018 1.025  1.010 - 1.025 Final  . Blood, UA 11/07/2018 Negative   Final  . pH, UA 11/07/2018 5.0  5.0 - 8.0 Final  . Protein, UA 11/07/2018  Positive* Negative Final  . Urobilinogen, UA 11/07/2018 1.0  0.2 or 1.0 E.U./dL Final  . Nitrite, UA 40/98/119112/20/2019 Negative   Final  . Leukocytes, UA 11/07/2018 Large (3+)* Negative Final  . Odor 11/07/2018 Positive   Final    Commons side effects, risks, benefits, and alternatives for medications and treatment plan prescribed today were discussed, and the patient expressed understanding of the given instructions. Patient is instructed to call or message via MyChart if he/she has any questions or concerns regarding our treatment plan. No barriers to understanding were identified. We discussed Red Flag symptoms and signs in detail. Patient expressed understanding regarding what to do in case of urgent or emergency type symptoms.   Medication list was reconciled, printed and provided to the patient in AVS. Patient instructions and summary information was reviewed with the patient as documented in the AVS. This note was prepared with assistance of Dragon voice recognition  software. Occasional wrong-word or sound-a-like substitutions may have occurred due to the inherent limitations of voice recognition software

## 2018-11-07 NOTE — Telephone Encounter (Signed)
Copied from CRM [id number]. Topic: General - Inquiry >> Nov 07, 2018  8:45 AM Windy KalataMichael, Taylor L, NT wrote: Reason for CRM: patient daughter [name] is calling and states that [facility] and said that her urine was dark and that she may have a UTI again. She states she just had one 2 months ago. [name] said she is unable to bring her in due to she can barely walk. She would like to know if she could come get a urine cup for a sample. She also seen a article about Gabapentin causing patient to have trouble breathing. She states 2 days ago her mother was complaining of  breathing brothers so she is going to contact her Urologist about that. Amy is going to check with Dr. Mardelle MatteAndy and contact [name]. Back.

## 2018-11-07 NOTE — Telephone Encounter (Signed)
Patient's daughter states that she is having some difficulty with urination, she states she is weak but is willing to bring the patient in. After talking to Dr. Mardelle MatteAndy, have patient come in for UA and evaluation. Patients daughter will bring her in.   Kathi SimpersAmy Mitchelle Goerner,  LPN

## 2018-11-07 NOTE — Patient Instructions (Addendum)
Please return on Monday if not improving.  Hydrate and take the antibiotics.  Monitor for fevers.  If you have any questions or concerns, please don't hesitate to send me a message via MyChart or call the office at [phone]. Thank you for visiting with us today! It's our pleasure caring for you.  For now, ok to hold the neurontin. We reevaluate.

## 2018-11-07 NOTE — Telephone Encounter (Signed)
Copied from CRM [id number]. Topic: General - Inquiry >> Nov 07, 2018  8:45 AM Windy KalataMichael, Taylor L, NT wrote: Reason for CRM: patient daughter [name] is calling and states that [facility] and said that her urine was dark and that she may have a UTI again. She states she just had one 2 months ago. [name] said she is unable to bring her in due to she can barely walk. She would like to know if she could come get a urine cup for a sample. She also seen a article about Gabapentin causing patient to have trouble breathing. She states 2 days ago her mother was complaining of  breathing brothers so she is going to contact her Urologist about that. Amy is going to check with Dr. Mardelle MatteAndy and contact [name]. Back. >> Nov 07, 2018  2:47 PM Jilda Rocheemaray, Melissa wrote: Daughter called again and needs advice before the weekend if possible, please advise  [phone]  .  Please advise.   Kathi SimpersAmy Peterman,  LPN

## 2018-11-07 NOTE — Telephone Encounter (Signed)
Patient's daughter called regarding her mother acting strange and progressively getting worse. She said that her mother told her it was hard to breath and that her lungs hurt. Her gabapentin was just recently increased. She was called this morning by the caregiver who told her that her mother's urine was dark. She's not wanting to eat or not walking. Please Call. Thanks

## 2018-11-07 NOTE — Telephone Encounter (Signed)
The gabapentin was increased 2 months ago.  I would have her address this with her PCP and rule out other causes first, such as UTI

## 2018-11-07 NOTE — Telephone Encounter (Signed)
Called and advised [name] of recommendations.

## 2018-11-08 ENCOUNTER — Encounter: Payer: Self-pay | Admitting: Family Medicine

## 2018-11-09 LAB — URINE CULTURE
MICRO NUMBER:: 91526717
SPECIMEN QUALITY:: ADEQUATE

## 2018-11-10 NOTE — Progress Notes (Signed)
+   UTI sensitive to abx used. No further action needed

## 2018-11-11 NOTE — Telephone Encounter (Signed)
Patients daughter, [name], calling back regarding symptoms. [name] states that she feels patient is on the mend and does not feel like she needs to be evaluated at this time. [name] wanted to note that patient is up walking with walker again.

## 2018-11-17 ENCOUNTER — Emergency Department (HOSPITAL_COMMUNITY): Payer: Medicare Other

## 2018-11-17 ENCOUNTER — Ambulatory Visit: Payer: Medicare Other | Admitting: Family Medicine

## 2018-11-17 ENCOUNTER — Ambulatory Visit: Payer: Self-pay | Admitting: *Deleted

## 2018-11-17 ENCOUNTER — Inpatient Hospital Stay (HOSPITAL_COMMUNITY)
Admission: EM | Admit: 2018-11-17 | Discharge: 2018-12-20 | DRG: 682 | Disposition: E | Payer: Medicare Other | Attending: Internal Medicine | Admitting: Internal Medicine

## 2018-11-17 ENCOUNTER — Other Ambulatory Visit: Payer: Self-pay

## 2018-11-17 ENCOUNTER — Encounter (HOSPITAL_COMMUNITY): Payer: Self-pay | Admitting: Emergency Medicine

## 2018-11-17 DIAGNOSIS — Z66 Do not resuscitate: Secondary | ICD-10-CM | POA: Diagnosis present

## 2018-11-17 DIAGNOSIS — Z833 Family history of diabetes mellitus: Secondary | ICD-10-CM

## 2018-11-17 DIAGNOSIS — Z8249 Family history of ischemic heart disease and other diseases of the circulatory system: Secondary | ICD-10-CM

## 2018-11-17 DIAGNOSIS — Z515 Encounter for palliative care: Secondary | ICD-10-CM | POA: Diagnosis present

## 2018-11-17 DIAGNOSIS — I10 Essential (primary) hypertension: Secondary | ICD-10-CM | POA: Diagnosis present

## 2018-11-17 DIAGNOSIS — J90 Pleural effusion, not elsewhere classified: Secondary | ICD-10-CM | POA: Diagnosis present

## 2018-11-17 DIAGNOSIS — Z993 Dependence on wheelchair: Secondary | ICD-10-CM

## 2018-11-17 DIAGNOSIS — J449 Chronic obstructive pulmonary disease, unspecified: Secondary | ICD-10-CM | POA: Diagnosis present

## 2018-11-17 DIAGNOSIS — E785 Hyperlipidemia, unspecified: Secondary | ICD-10-CM | POA: Diagnosis present

## 2018-11-17 DIAGNOSIS — Z7951 Long term (current) use of inhaled steroids: Secondary | ICD-10-CM

## 2018-11-17 DIAGNOSIS — R32 Unspecified urinary incontinence: Secondary | ICD-10-CM | POA: Diagnosis present

## 2018-11-17 DIAGNOSIS — R739 Hyperglycemia, unspecified: Secondary | ICD-10-CM | POA: Diagnosis present

## 2018-11-17 DIAGNOSIS — Z7982 Long term (current) use of aspirin: Secondary | ICD-10-CM

## 2018-11-17 DIAGNOSIS — I959 Hypotension, unspecified: Secondary | ICD-10-CM | POA: Diagnosis present

## 2018-11-17 DIAGNOSIS — E870 Hyperosmolality and hypernatremia: Secondary | ICD-10-CM | POA: Diagnosis present

## 2018-11-17 DIAGNOSIS — I1310 Hypertensive heart and chronic kidney disease without heart failure, with stage 1 through stage 4 chronic kidney disease, or unspecified chronic kidney disease: Secondary | ICD-10-CM | POA: Diagnosis present

## 2018-11-17 DIAGNOSIS — E861 Hypovolemia: Secondary | ICD-10-CM | POA: Diagnosis present

## 2018-11-17 DIAGNOSIS — R410 Disorientation, unspecified: Secondary | ICD-10-CM | POA: Diagnosis not present

## 2018-11-17 DIAGNOSIS — I4891 Unspecified atrial fibrillation: Secondary | ICD-10-CM | POA: Diagnosis present

## 2018-11-17 DIAGNOSIS — Z91040 Latex allergy status: Secondary | ICD-10-CM

## 2018-11-17 DIAGNOSIS — Z9109 Other allergy status, other than to drugs and biological substances: Secondary | ICD-10-CM

## 2018-11-17 DIAGNOSIS — F0390 Unspecified dementia without behavioral disturbance: Secondary | ICD-10-CM | POA: Diagnosis present

## 2018-11-17 DIAGNOSIS — I4510 Unspecified right bundle-branch block: Secondary | ICD-10-CM | POA: Diagnosis present

## 2018-11-17 DIAGNOSIS — I469 Cardiac arrest, cause unspecified: Secondary | ICD-10-CM | POA: Diagnosis not present

## 2018-11-17 DIAGNOSIS — N179 Acute kidney failure, unspecified: Secondary | ICD-10-CM | POA: Diagnosis not present

## 2018-11-17 DIAGNOSIS — R0902 Hypoxemia: Secondary | ICD-10-CM

## 2018-11-17 DIAGNOSIS — Z888 Allergy status to other drugs, medicaments and biological substances status: Secondary | ICD-10-CM

## 2018-11-17 DIAGNOSIS — G9341 Metabolic encephalopathy: Secondary | ICD-10-CM | POA: Diagnosis present

## 2018-11-17 DIAGNOSIS — E860 Dehydration: Secondary | ICD-10-CM | POA: Diagnosis present

## 2018-11-17 DIAGNOSIS — Z881 Allergy status to other antibiotic agents status: Secondary | ICD-10-CM

## 2018-11-17 DIAGNOSIS — M1990 Unspecified osteoarthritis, unspecified site: Secondary | ICD-10-CM | POA: Diagnosis present

## 2018-11-17 DIAGNOSIS — N183 Chronic kidney disease, stage 3 unspecified: Secondary | ICD-10-CM | POA: Diagnosis present

## 2018-11-17 DIAGNOSIS — R7309 Other abnormal glucose: Secondary | ICD-10-CM | POA: Diagnosis present

## 2018-11-17 DIAGNOSIS — K219 Gastro-esophageal reflux disease without esophagitis: Secondary | ICD-10-CM | POA: Diagnosis present

## 2018-11-17 DIAGNOSIS — Z7189 Other specified counseling: Secondary | ICD-10-CM

## 2018-11-17 DIAGNOSIS — Z87440 Personal history of urinary (tract) infections: Secondary | ICD-10-CM

## 2018-11-17 DIAGNOSIS — L89151 Pressure ulcer of sacral region, stage 1: Secondary | ICD-10-CM | POA: Diagnosis present

## 2018-11-17 DIAGNOSIS — E875 Hyperkalemia: Secondary | ICD-10-CM | POA: Diagnosis present

## 2018-11-17 DIAGNOSIS — Z79899 Other long term (current) drug therapy: Secondary | ICD-10-CM

## 2018-11-17 DIAGNOSIS — J9691 Respiratory failure, unspecified with hypoxia: Secondary | ICD-10-CM | POA: Diagnosis present

## 2018-11-17 HISTORY — DX: Chronic kidney disease, stage 3 unspecified: N18.30

## 2018-11-17 HISTORY — DX: Unspecified dementia, unspecified severity, without behavioral disturbance, psychotic disturbance, mood disturbance, and anxiety: F03.90

## 2018-11-17 HISTORY — DX: Chronic kidney disease, stage 3 (moderate): N18.3

## 2018-11-17 HISTORY — DX: Acute kidney failure, unspecified: N17.9

## 2018-11-17 LAB — COMPREHENSIVE METABOLIC PANEL
ALT: 22 U/L (ref 0–44)
AST: 37 U/L (ref 15–41)
Albumin: 3.5 g/dL (ref 3.5–5.0)
Alkaline Phosphatase: 90 U/L (ref 38–126)
Anion gap: 13 (ref 5–15)
BUN: 62 mg/dL — ABNORMAL HIGH (ref 8–23)
CO2: 34 mmol/L — ABNORMAL HIGH (ref 22–32)
Calcium: 9.4 mg/dL (ref 8.9–10.3)
Chloride: 95 mmol/L — ABNORMAL LOW (ref 98–111)
Creatinine, Ser: 2.47 mg/dL — ABNORMAL HIGH (ref 0.44–1.00)
GFR calc Af Amer: 19 mL/min — ABNORMAL LOW (ref >60)
GFR calc non Af Amer: 16 mL/min — ABNORMAL LOW (ref >60)
Glucose, Bld: 128 mg/dL — ABNORMAL HIGH (ref 70–99)
Potassium: 5.3 mmol/L — ABNORMAL HIGH (ref 3.5–5.1)
Sodium: 142 mmol/L (ref 135–145)
TOTAL PROTEIN: 6.9 g/dL (ref 6.5–8.1)
Total Bilirubin: 1.1 mg/dL (ref 0.3–1.2)

## 2018-11-17 LAB — I-STAT TROPONIN, ED: Troponin i, poc: 0.01 ng/mL (ref 0.00–0.08)

## 2018-11-17 LAB — CBC
HCT: 43.8 % (ref 36.0–46.0)
Hemoglobin: 13.7 g/dL (ref 12.0–15.0)
MCH: 28.7 pg (ref 26.0–34.0)
MCHC: 31.3 g/dL (ref 30.0–36.0)
MCV: 91.6 fL (ref 80.0–100.0)
Platelets: 252 10*3/uL (ref 150–400)
RBC: 4.78 MIL/uL (ref 3.87–5.11)
RDW: 15 % (ref 11.5–15.5)
WBC: 8 10*3/uL (ref 4.0–10.5)
nRBC: 0 % (ref 0.0–0.2)

## 2018-11-17 LAB — URINALYSIS, ROUTINE W REFLEX MICROSCOPIC
BILIRUBIN URINE: NEGATIVE
Glucose, UA: NEGATIVE mg/dL
Hgb urine dipstick: NEGATIVE
Ketones, ur: NEGATIVE mg/dL
Leukocytes, UA: NEGATIVE
Nitrite: NEGATIVE
Protein, ur: NEGATIVE mg/dL
Specific Gravity, Urine: 1.014 (ref 1.005–1.030)
pH: 5 (ref 5.0–8.0)

## 2018-11-17 LAB — BRAIN NATRIURETIC PEPTIDE: B Natriuretic Peptide: 190.3 pg/mL — ABNORMAL HIGH (ref 0.0–100.0)

## 2018-11-17 LAB — VALPROIC ACID LEVEL: VALPROIC ACID LVL: 16 ug/mL — AB (ref 50.0–100.0)

## 2018-11-17 LAB — I-STAT CG4 LACTIC ACID, ED: Lactic Acid, Venous: 1.87 mmol/L (ref 0.5–1.9)

## 2018-11-17 MED ORDER — ONDANSETRON HCL 4 MG/2ML IJ SOLN: 4.0000 mg | Freq: Four times a day (QID) | INTRAMUSCULAR | Status: DC | PRN

## 2018-11-17 MED ORDER — SODIUM CHLORIDE 0.9 % IV BOLUS
500.0000 mL | Freq: Once | INTRAVENOUS | Status: AC
  Administered 2018-11-17: 500 mL via INTRAVENOUS

## 2018-11-17 MED ORDER — ONDANSETRON HCL 4 MG PO TABS: 4.0000 mg | ORAL_TABLET | Freq: Four times a day (QID) | ORAL | Status: DC | PRN

## 2018-11-17 MED ORDER — POLYETHYLENE GLYCOL 3350 17 G PO PACK: 17.0000 g | PACK | Freq: Every day | ORAL | Status: DC | PRN

## 2018-11-17 MED ORDER — ENOXAPARIN SODIUM 30 MG/0.3ML ~~LOC~~ SOLN
30.0000 mg | SUBCUTANEOUS | Status: DC
  Administered 2018-11-17: 30 mg via SUBCUTANEOUS
  Filled 2018-11-17: qty 0.3

## 2018-11-17 MED ORDER — FAMOTIDINE 20 MG PO TABS: 20.0000 mg | ORAL_TABLET | Freq: Every day | ORAL | Status: DC

## 2018-11-17 MED ORDER — DIVALPROEX SODIUM 125 MG PO CSDR
250.0000 mg | DELAYED_RELEASE_CAPSULE | Freq: Every day | ORAL | Status: DC
  Filled 2018-11-17: qty 2

## 2018-11-17 MED ORDER — LACTATED RINGERS IV SOLN
INTRAVENOUS | Status: DC
  Administered 2018-11-17 – 2018-11-19 (×3): via INTRAVENOUS

## 2018-11-17 MED ORDER — ACETAMINOPHEN 650 MG RE SUPP: 650.0000 mg | Freq: Four times a day (QID) | RECTAL | Status: DC | PRN

## 2018-11-17 MED ORDER — ACETAMINOPHEN 325 MG PO TABS: 650.0000 mg | ORAL_TABLET | Freq: Four times a day (QID) | ORAL | Status: DC | PRN

## 2018-11-17 MED ORDER — ASPIRIN EC 81 MG PO TBEC: 81.0000 mg | DELAYED_RELEASE_TABLET | Freq: Every day | ORAL | Status: DC

## 2018-11-17 MED ORDER — IPRATROPIUM-ALBUTEROL 0.5-2.5 (3) MG/3ML IN SOLN
3.0000 mL | Freq: Four times a day (QID) | RESPIRATORY_TRACT | Status: DC
  Administered 2018-11-17 (×2): 3 mL via RESPIRATORY_TRACT
  Filled 2018-11-17: qty 3

## 2018-11-17 MED ORDER — ALBUTEROL SULFATE (2.5 MG/3ML) 0.083% IN NEBU
2.5000 mg | INHALATION_SOLUTION | RESPIRATORY_TRACT | Status: DC | PRN
  Administered 2018-11-18: 2.5 mg via RESPIRATORY_TRACT
  Filled 2018-11-17: qty 3

## 2018-11-17 MED ORDER — AMLODIPINE BESYLATE 5 MG PO TABS: 5.0000 mg | ORAL_TABLET | Freq: Every day | ORAL | Status: DC

## 2018-11-17 MED ORDER — DOCUSATE SODIUM 100 MG PO CAPS
100.0000 mg | ORAL_CAPSULE | Freq: Two times a day (BID) | ORAL | Status: DC
  Filled 2018-11-17: qty 1

## 2018-11-17 MED ORDER — SODIUM CHLORIDE 0.9% FLUSH
3.0000 mL | Freq: Two times a day (BID) | INTRAVENOUS | Status: DC
  Administered 2018-11-19 – 2018-11-21 (×4): 3 mL via INTRAVENOUS

## 2018-11-17 MED ORDER — LORATADINE 10 MG PO TABS
10.0000 mg | ORAL_TABLET | Freq: Every day | ORAL | Status: DC
  Filled 2018-11-17: qty 1

## 2018-11-17 MED ORDER — SERTRALINE HCL 50 MG PO TABS
50.0000 mg | ORAL_TABLET | Freq: Every day | ORAL | Status: DC
  Filled 2018-11-17: qty 1

## 2018-11-17 MED ORDER — FLUTICASONE PROPIONATE 50 MCG/ACT NA SUSP
2.0000 | Freq: Every day | NASAL | Status: DC
  Administered 2018-11-18 – 2018-11-21 (×4): 2 via NASAL
  Filled 2018-11-17: qty 16

## 2018-11-17 NOTE — ED Provider Notes (Signed)
[facility] Emergency Department Provider Note MRN:  [id number]  Arrival date & time: 02-27-2018     Chief Complaint   Altered Mental Status   History of Present Illness   [name] is a 84 y.o. year-old female with a history of dementia presenting to the ED with chief complaint of altered mental status.  Patient has a history of dementia, there is report from daughter that her confusion or dementia is much worse recently.  Reported recently being treated for a UTI.  EMS also reports A. fib in route, which is not normal for the patient.  No family at bedside to provide further information, patient is extremely demented or altered.  Unsure of baseline.  I was unable to obtain an accurate HPI, PMH, or ROS due to the patient's altered mental status, dementia.  Review of Systems  Positive for altered mental status.  Patient's Health History    Past Medical History:  Diagnosis Date  . Anemia, unspecified   . COPD (chronic obstructive pulmonary disease) (HCC)   . Diverticulosis of colon (without mention of hemorrhage)   . DJD (degenerative joint disease)   . GERD (gastroesophageal reflux disease)   . Left humeral fracture 08/28/2017  . Lumbago   . Osteoarthrosis, unspecified whether generalized or localized, unspecified site   . Other abnormal glucose   . Other and unspecified hyperlipidemia   . Right bundle branch block   . Shortness of breath   . Solitary cyst of breast   . Spinal stenosis, unspecified region other than cervical   . Unspecified essential hypertension     Past Surgical History:  Procedure Laterality Date  . CATARACT EXTRACTION    . conversion to right THR    . right hip hemiarthroplasty     total    Family History  Problem Relation Age of Onset  . Heart failure Father   . Cancer Brother   . Cancer Brother   . Diabetes Daughter   . Cancer Brother     Social History   Socioeconomic History  . Marital status: Divorced   Spouse name: Not on file  . Number of children: Not on file  . Years of education: Not on file  . Highest education level: Not on file  Occupational History  . Occupation: retired  Engineer, productionocial Needs  . Financial resource strain: Not on file  . Food insecurity:    Worry: Not on file    Inability: Not on file  . Transportation needs:    Medical: Not on file    Non-medical: Not on file  Tobacco Use  . Smoking status: Never Smoker  . Smokeless tobacco: Never Used  Substance and Sexual Activity  . Alcohol use: No    Alcohol/week: 0.0 standard drinks  . Drug use: No  . Sexual activity: Never  Lifestyle  . Physical activity:    Days per week: Not on file    Minutes per session: Not on file  . Stress: Not on file  Relationships  . Social connections:    Talks on phone: Not on file    Gets together: Not on file    Attends religious service: Not on file    Active member of club or organization: Not on file    Attends meetings of clubs or organizations: Not on file    Relationship status: Not on file  . Intimate partner violence:    Fear of current or ex partner: Not on file  Emotionally abused: Not on file    Physically abused: Not on file    Forced sexual activity: Not on file  Other Topics Concern  . Not on file  Social History Narrative  . Not on file     Physical Exam  Vital Signs and Nursing Notes reviewed Vitals:   November 27, 2017 1415 November 27, 2017 1430  BP: (!) 102/57 (!) 93/57  Pulse: 100 (!) 53  Resp: 20 (!) 25  Temp:    SpO2: 99% 99%    CONSTITUTIONAL: Chronically ill-appearing, NAD NEURO:  Alert and oriented x 3, no focal deficits EYES:  eyes equal and reactive ENT/NECK:  no LAD, no JVD CARDIO: Tachycardic and irregular rate, well-perfused, normal S1 and S2 PULM:  CTAB no wheezing or rhonchi GI/GU:  normal bowel sounds, non-distended, non-tender MSK/SPINE:  No gross deformities, 1+ pitting edema to bilateral lower extremities SKIN:  no rash, atraumatic PSYCH:   Appropriate speech and behavior  Diagnostic and Interventional Summary    EKG Interpretation  Date/Time:  Monday November 17 2018 10:21:31 EST Ventricular Rate:  112 PR Interval:    QRS Duration: 126 QT Interval:  368 QTC Calculation: 503 R Axis:   -72 Text Interpretation:  Atrial fibrillation IVCD, consider atypical RBBB Inferior infarct, old Confirmed by Kennis CarinaBero, Brantlee Hinde [phone]) on 12/17/2017 12:44:35 PM Also confirmed by Kennis CarinaBero, Karl Knarr [phone]), editor Barbette Hairassel, Kerry [phone])  on 12/17/2017 1:05:20 PM      Labs Reviewed  COMPREHENSIVE METABOLIC PANEL - Abnormal; Notable for the following components:      Result Value   Potassium 5.3 (*)    Chloride 95 (*)    CO2 34 (*)    Glucose, Bld 128 (*)    BUN 62 (*)    Creatinine, Ser 2.47 (*)    GFR calc non Af Amer 16 (*)    GFR calc Af Amer 19 (*)    All other components within normal limits  BRAIN NATRIURETIC PEPTIDE - Abnormal; Notable for the following components:   B Natriuretic Peptide 190.3 (*)    All other components within normal limits  CBC  URINALYSIS, ROUTINE W REFLEX MICROSCOPIC  I-STAT TROPONIN, ED  I-STAT CG4 LACTIC ACID, ED    CT Head Wo Contrast  Final Result    DG Chest Port 1 View  Final Result      Medications  sodium chloride 0.9 % bolus 500 mL (0 mLs Intravenous Stopped November 27, 2017 1441)     Procedures Critical Care  ED Course and Medical Decision Making  I have reviewed the triage vital signs and the nursing notes.  Pertinent labs & imaging results that were available during my care of the patient were reviewed by me and considered in my medical decision making (see below for details).  Unclear etiology of worsening dementia in this 84 year old female, considering metabolic disarray, continued UTI, work-up pending.  Labs reveal AKI, unsure if this is cause or effect of patient's functional/mental decline.  Admitted to hospitalist service for further care and evaluation.  Elmer SowMichael M. Pilar PlateBero, MD [facility] [email or web address]  Final Clinical Impressions(s) / ED Diagnoses     ICD-10-CM   1. AKI (acute kidney injury) (HCC) N17.9   2. Confusion R41.0 [facility]    [facility]    ED Discharge Orders    None         Sabas SousBero, Samanvitha Germany M, MD November 27, 2017 [phone]

## 2018-11-17 NOTE — Telephone Encounter (Signed)
Daughter calling, [place]. States patient not able to speak with triage due to confusion. Pt saw Dr. Mardelle MatteAndy 11/07/18, E-Coli UTI. Given rocephin in office, 7 days of septra, completed Friday. Daughter states pt with increased confusion, weakness, lethargy; "Fell asleep at dinner table yesterday.Can not ambulate, she's not acting."  Reports diarrhea x 4 days, incontinent stool this am. States confused, unable to attend to basic ADLs, "Has had stool everywhere, sat on trash can to go to bathroom." Reports bladder pressure. "Not acting right at all." Unsure of how severe diarrhea is as pt is alone after 5pm; daughter has been ill and unable to sit with her. Called practice, [place], recommended appt with Dr. Mardelle MatteAndy per her note of 11/07/18.  Upon further assessment, pt directed to ED. Daughter states will follow disposition. She will have facility call for transport.  Reason for Disposition . Patient sounds very sick or weak to the triager  Answer Assessment - Initial Assessment Questions 1. SYMPTOM: "What is the main symptom you are concerned about?" (e.g., weakness, numbness)   Increased confusion, lethargy, weakness  "Can't walk." 2. ONSET: "When did this start?" (minutes, hours, days; while sleeping)     With UTI 11/07/18 , worsening past 4 days. 3. LAST NORMAL: "When was the last time you were normal (no symptoms)?"     1 1/2 weeks ago 4. PATTERN "Does this come and go, or has it been constant since it started?"  "Is it present now?"    Constant 5. CARDIAC SYMPTOMS: "Have you had any of the following symptoms: chest pain, difficulty breathing, palpitations?"     unsure 6. NEUROLOGIC SYMPTOMS: "Have you had any of the following symptoms: headache, dizziness, vision loss, double vision, changes in speech, unsteady on your feet?"    Weakness, can not ambulate, "Falling asleep at dinner table 7. OTHER SYMPTOMS: "Do you have any other symptoms?"     Diarrhea x 4 days, incontinence, bladder  pressure, urgency  Protocols used: NEUROLOGIC DEFICIT-A-AH

## 2018-11-17 NOTE — H&P (Signed)
History and Physical    Gwendolyn DolphinMargaret J [name] [id number] DOB: February 13, 1923 DOA: 08-Apr-2018  PCP: Willow OraAndy, Camille L, MD Consultants:  Everlena CooperJaffe - neurology; Dione BoozeGroat - eye Patient coming from: [facility]; [facility]:  Daughter, [phone]  Chief Complaint: Confusion  HPI: [name] is a 84 y.o. female with medical history significant of dementia; HTN; HLD; COPD; and IFG presenting with confusion.  She has always been pretty alert, but her sitter during the day (daughter usually sees at least every other day) reported that she seemed to be getting better from recent UTI (seen week before last).  She was acting "not normal" at that time - forgot how to walk, forgot how to use the telephone.  She was started on Septra and she seemed to be improving x 1-2 days.  Then she started going downhill again - would not walk to the dining hall, decreased PO even while being hand fed.  Her daughter was concerned that the Gabapentin was causing her symptoms and stopped this, last dose Friday 1 1/2 weeks ago.  Her daughter called Dr. Mardelle MatteAndy this AM because her sitter got there today and she has marked LE edema.  Marked edema, little appetite, minimal PO intake, confusion and somnolence.   ED Course:  AKI - dementia, not eating/drinking.  Uncertain why she is declining with mental status.  Review of Systems: unable to perform  Ambulatory Status: Ambulates with a rollator normally  Past Medical History:  Diagnosis Date  . Acute renal failure superimposed on stage 3 chronic kidney disease (HCC)   . Anemia, unspecified   . COPD (chronic obstructive pulmonary disease) (HCC)   . Dementia (HCC)   . Diverticulosis of colon (without mention of hemorrhage)   . DJD (degenerative joint disease)   . GERD (gastroesophageal reflux disease)   . Left humeral fracture 08/28/2017  . Lumbago   . Osteoarthrosis, unspecified whether generalized or localized, unspecified site   . Other abnormal glucose   . Other and unspecified  hyperlipidemia   . Right bundle branch block   . Shortness of breath   . Solitary cyst of breast   . Spinal stenosis, unspecified region other than cervical   . Unspecified essential hypertension     Past Surgical History:  Procedure Laterality Date  . CATARACT EXTRACTION    . conversion to right THR    . right hip hemiarthroplasty     total    Social History   Socioeconomic History  . Marital status: Divorced    Spouse name: Not on file  . Number of children: Not on file  . Years of education: Not on file  . Highest education level: Not on file  Occupational History  . Occupation: retired  Engineer, productionocial Needs  . Financial resource strain: Not on file  . Food insecurity:    Worry: Not on file    Inability: Not on file  . Transportation needs:    Medical: Not on file    Non-medical: Not on file  Tobacco Use  . Smoking status: Never Smoker  . Smokeless tobacco: Never Used  Substance and Sexual Activity  . Alcohol use: No    Alcohol/week: 0.0 standard drinks  . Drug use: No  . Sexual activity: Never  Lifestyle  . Physical activity:    Days per week: Not on file    Minutes per session: Not on file  . Stress: Not on file  Relationships  . Social connections:    Talks on phone: Not  on file    Gets together: Not on file    Attends religious service: Not on file    Active member of club or organization: Not on file    Attends meetings of clubs or organizations: Not on file    Relationship status: Not on file  . Intimate partner violence:    Fear of current or ex partner: Not on file    Emotionally abused: Not on file    Physically abused: Not on file    Forced sexual activity: Not on file  Other Topics Concern  . Not on file  Social History Narrative  . Not on file    Allergies  Allergen Reactions  . Azithromycin Shortness Of Breath  . Tape Other (See Comments)    PATIENT'S SKIN IS VERY THIN- TEARS AND BRUISES VERY EASILY!!!!  . Ciprofloxacin Other (See  Comments)    Hallucinations   . Levofloxacin Other (See Comments)    Insomnia, indigestion, tingling sensation in legs  . Furosemide Rash and Other (See Comments)    Bullous pemphigoid = "a rare autoimmune skin condition causing large, fluid-filled blisters"  . Latex Rash  . Other Rash    EKG leads caused a rash that required steroids to clear    Family History  Problem Relation Age of Onset  . Heart failure Father   . Cancer Brother   . Cancer Brother   . Diabetes Daughter   . Cancer Brother     Prior to Admission medications   Medication Sig Start Date End Date Taking? Authorizing Provider  ADVAIR DISKUS 100-50 MCG/DOSE AEPB INHALE 1 PUFF BY MOUTH TWICE DAILY. RINSE MOUTH AFTER USE TO PREVENT THRUSH. 01/23/18   Michele Mcalpine, MD  albuterol (PROAIR HFA) 108 (90 Base) MCG/ACT inhaler USE 1 TO 2 PUFFS EVERY SIX HOURS AS NEEDED. Patient taking differently: Inhale 1-2 puffs into the lungs every 6 (six) hours as needed for wheezing or shortness of breath. USE 1 TO 2 PUFFS EVERY SIX HOURS AS NEEDED. 08/19/17   Michele Mcalpine, MD  amLODipine (NORVASC) 5 MG tablet TAKE ONE TABLET BY MOUTH ONCE DAILY. 01/22/17   Michele Mcalpine, MD  ASPIRIN LOW DOSE 81 MG EC tablet TAKE ONE TABLET BY MOUTH ONCE DAILY. 06/12/16   Michele Mcalpine, MD  cetirizine (ZYRTEC) 10 MG tablet TAKE 1 TABLET BY MOUTH ONCE DAILY FOR ALLERGIES. 08/01/17   Michele Mcalpine, MD  Cholecalciferol (VITAMIN D3) 2000 units capsule TAKE (1) CAPSULE BY MOUTH ONCE DAILY. 09/19/17   Michele Mcalpine, MD  divalproex (DEPAKOTE SPRINKLE) 125 MG capsule OPEN (2) CAPSULES (250MG ) AND SPRINKLE ON APPLESAUCE AT BEDTIME. 10/29/18   Michele Mcalpine, MD  fluticasone (FLONASE) 50 MCG/ACT nasal spray SPRAY 2 SPRAYS INTO EACH NOSTRIL ONCE DAILY. 03/16/16   Bevelyn Ngo, NP  gabapentin (NEURONTIN) 100 MG capsule TAKE (2) CAPSULES BY MOUTH EACH MORNING. TAKE (6) CAPSULES BY MOUTH AT BEDTIME. 09/29/18   Jaffe, Adam R, DO  LORazepam (ATIVAN) 0.5 MG tablet TAKE  (1/2) TABLET BY MOUTH TWICE DAILY.(11:30AM & 4:30PM) 09/04/18   Michele Mcalpine, MD  MAPAP 500 MG tablet TAKE 2 TABLETS (650MG ) BY MOUTH EVERY 4 HOURS AS NEEDED FOR MILD PAIN. 01/17/18   Michele Mcalpine, MD  Melatonin 3 MG SUBL Place 6 mg under the tongue daily. 10/16/17   Michele Mcalpine, MD  Multiple Vitamin (DAILY-VITE) TABS TAKE ONE TABLET BY MOUTH ONCE DAILY. 04/09/16   Michele Mcalpine, MD  ranitidine (  ZANTAC) 150 MG tablet TAKE (1) TABLET BY MOUTH ONCE DAILY. 08/12/17   Michele McalpineNadel, Scott M, MD  sertraline (ZOLOFT) 50 MG tablet TAKE (1) TABLET BY MOUTH ONCE DAILY. 10/29/18   Michele McalpineNadel, Scott M, MD  Spacer/Aero-Holding Chambers (AEROCHAMBER PLUS WITH MASK) inhaler Use as instructed 06/12/15   Arby BarrettePfeiffer, Marcy, MD  sulfamethoxazole-trimethoprim (BACTRIM DS,SEPTRA DS) 800-160 MG tablet Take 1 tablet by mouth 2 (two) times daily. 11/07/18   Willow OraAndy, Camille L, MD  torsemide (DEMADEX) 20 MG tablet TAKE 20 MG BY MOUTH EVERY MORNING. 08/23/17   Julio SicksParrett, Tammy S, NP    Physical Exam: Vitals:   10/22/2018 1535 11/04/2018 1537 10/30/2018 1542 11/11/2018 1557  BP: 127/61     Pulse:  100  (!) 49  Resp: 19 14 15 14   Temp:      TempSrc:      SpO2:  (!) 88% 97% 100%     General:  She is obtunded and unresponsive, but resting comfortably Eyes: Closed, PERRLA when pried open but patient tries to resist ENT:  Normal lips & tongue, mildly dry mm Neck:  no LAD, masses or thyromegaly; no carotid bruits Cardiovascular:  Irregularly irregular, no m/r/g. 3+ LE edema.  Respiratory:   CTA bilaterally with no wheezes/rales/rhonchi.  Normal respiratory effort. Abdomen:  soft, NT, ND, NABS Skin:  no rash or induration seen on limited exam Musculoskeletal: no bony abnormality Psychiatric: Obtunded, unresponsive Neurologic: unable to perform    Radiological Exams on Admission: Ct Head Wo Contrast  Result Date: 11/10/2018 CLINICAL DATA:  History of dementia. Worsening mental status. Recent fall. Atrial fibrillation. EXAM: CT HEAD  WITHOUT CONTRAST TECHNIQUE: Contiguous axial images were obtained from the base of the skull through the vertex without intravenous contrast. COMPARISON:  04/23/2016. FINDINGS: Brain: No evidence for acute infarction, hemorrhage, mass lesion, hydrocephalus, or extra-axial fluid. Generalized atrophy. Hypoattenuation of white matter, likely small vessel disease. Vascular: Calcification of the cavernous internal carotid arteries consistent with cerebrovascular atherosclerotic disease. No signs of intracranial large vessel occlusion. Skull: Motion degraded, but calvarium grossly intact. Sinuses/Orbits: No acute findings. Other: None. IMPRESSION: Atrophy and small vessel disease. No acute intracranial findings. Similar appearance to priors. Electronically Signed   By: Elsie StainJohn T Curnes M.D.   On: 11/07/2018 11:45   Dg Chest Port 1 View  Result Date: 10/19/2018 CLINICAL DATA:  Confusion, UTI last week. New onset atrial fibrillation EXAM: PORTABLE CHEST 1 VIEW COMPARISON:  08/27/2017 FINDINGS: There is mild bilateral interstitial thickening. There is no focal parenchymal opacity. There is no pneumothorax. There is a possible trace left pleural effusion. There is stable cardiomegaly. The osseous structures are unremarkable. IMPRESSION: Mild bilateral interstitial thickening likely chronic. Mild superimposed interstitial edema is not excluded. Electronically Signed   By: Elige KoHetal  Patel   On: 11/16/2018 11:01    EKG: Independently reviewed.  Afib with rate 112; IVCD; nonspecific ST changes with no evidence of acute ischemia   Labs on Admission: I have personally reviewed the available labs and imaging studies at the time of the admission.  Pertinent labs:   K+ 5.3 CO2 34 Glucose 128 BUN 62/Creatinine 2.47/GFR 16; 15/1.00/55 on 10/22 BNP 190.3 Troponin 0.01 Lactate 1.87 Normal CBC Normal UA  Assessment/Plan Principal Problem:   Acute metabolic encephalopathy Active Problems:   Essential hypertension    COPD (chronic obstructive pulmonary disease) (HCC)   Other abnormal glucose   Dementia (HCC)   Acute renal failure superimposed on stage 3 chronic kidney disease (HCC)   Acute metabolic encephalopathy -Patient presenting  with encephalopathy as evidenced by her obtunded/unresponsiveness -She recently had a UTI and has had waxing and waning mental status since then -Evaluation thus far unremarkable other than acute renal failure which is likely related to patient's decreased PO intake and possibly related to recent use of Septra to treat UTI -There is no evidence of active infection at this time -She does take Depakote and I do not appreciate a recent valproic acid level; will check now -Will observe for now on telemetry with IVF hydration   Acute renal failure on stage 3 CKD -As noted above, likely associated with decreased PO intake and possibly with recent use of Septra -Will gently hydrate and follow -AMS may be associated with her dehydration  Dementia -Patient's daughter reports mild dementia -Will follow   HTN -Continue Norvasc tomorrow, as she was hypotensive upon arrival in the ER - likely hypovolemic  COPD -Patient does not appear able to participate in Advair inhalation at this time -Will instead cover with standing Duonebs and prn Albuterol  Hyperglycemia -May be stress response -Will follow with fasting AM labs -It is unlikely that she will need acute or chronic treatment for this issue   DVT prophylaxis: Lovenox  Code Status: DNR - confirmed with family Family Communication: Daughter was present throughout evaluation Disposition Plan:  Back to [place] once clinically improved Consults called: None Admission status: It is my clinical opinion that referral for OBSERVATION is reasonable and necessary in this patient based on the above information provided. The aforementioned taken together are felt to place the patient at high risk for further clinical deterioration.  However it is anticipated that the patient may be medically stable for discharge from the hospital within 24 to 48 hours.    Jonah Blue MD [facility]  If note is complete, please contact covering daytime or nighttime physician. [email or web address] Password [email or web address]  11/01/2018, 4:01 PM

## 2018-11-17 NOTE — ED Notes (Signed)
Attempted report, left call back number with Neurosurgeon3W secretary.

## 2018-11-17 NOTE — Telephone Encounter (Signed)
Agree. Needs eval for urosepsis.

## 2018-11-17 NOTE — ED Triage Notes (Signed)
Pt is from [facility] with hx of dementia, per pts daighter pt is not acting herself, pt just finished meds for UTI last week and daughter took pt off her gabapentin x 1 week ago and she  Felt that it was making her demntia worse ,,, pt also has new onset afib per ems

## 2018-11-17 NOTE — ED Notes (Signed)
Patient SpO2 at 86% with good waveform. Patient repositioned, then placed on 2L O2 now SpO2 96%.

## 2018-11-17 NOTE — Progress Notes (Signed)
Patient arrived to 3w11. Daughter at bedside. Patient is A&O x0. Edema in lower extremities. Stage 1 ulcer on bottom, small open wound on right side of bottom. Tele has been placed. Nurse will continue to monitor. [name]

## 2018-11-18 ENCOUNTER — Inpatient Hospital Stay (HOSPITAL_COMMUNITY): Payer: Medicare Other

## 2018-11-18 DIAGNOSIS — I959 Hypotension, unspecified: Secondary | ICD-10-CM | POA: Diagnosis present

## 2018-11-18 DIAGNOSIS — E870 Hyperosmolality and hypernatremia: Secondary | ICD-10-CM | POA: Diagnosis present

## 2018-11-18 DIAGNOSIS — Z7189 Other specified counseling: Secondary | ICD-10-CM | POA: Diagnosis not present

## 2018-11-18 DIAGNOSIS — J42 Unspecified chronic bronchitis: Secondary | ICD-10-CM | POA: Diagnosis not present

## 2018-11-18 DIAGNOSIS — E875 Hyperkalemia: Secondary | ICD-10-CM | POA: Diagnosis present

## 2018-11-18 DIAGNOSIS — N179 Acute kidney failure, unspecified: Secondary | ICD-10-CM | POA: Diagnosis present

## 2018-11-18 DIAGNOSIS — I10 Essential (primary) hypertension: Secondary | ICD-10-CM

## 2018-11-18 DIAGNOSIS — J449 Chronic obstructive pulmonary disease, unspecified: Secondary | ICD-10-CM | POA: Diagnosis present

## 2018-11-18 DIAGNOSIS — E785 Hyperlipidemia, unspecified: Secondary | ICD-10-CM | POA: Diagnosis present

## 2018-11-18 DIAGNOSIS — K219 Gastro-esophageal reflux disease without esophagitis: Secondary | ICD-10-CM | POA: Diagnosis present

## 2018-11-18 DIAGNOSIS — Z66 Do not resuscitate: Secondary | ICD-10-CM | POA: Diagnosis present

## 2018-11-18 DIAGNOSIS — R7309 Other abnormal glucose: Secondary | ICD-10-CM

## 2018-11-18 DIAGNOSIS — I1310 Hypertensive heart and chronic kidney disease without heart failure, with stage 1 through stage 4 chronic kidney disease, or unspecified chronic kidney disease: Secondary | ICD-10-CM | POA: Diagnosis present

## 2018-11-18 DIAGNOSIS — J9691 Respiratory failure, unspecified with hypoxia: Secondary | ICD-10-CM | POA: Diagnosis present

## 2018-11-18 DIAGNOSIS — L89151 Pressure ulcer of sacral region, stage 1: Secondary | ICD-10-CM | POA: Diagnosis present

## 2018-11-18 DIAGNOSIS — E861 Hypovolemia: Secondary | ICD-10-CM | POA: Diagnosis present

## 2018-11-18 DIAGNOSIS — J90 Pleural effusion, not elsewhere classified: Secondary | ICD-10-CM | POA: Diagnosis present

## 2018-11-18 DIAGNOSIS — I469 Cardiac arrest, cause unspecified: Secondary | ICD-10-CM | POA: Diagnosis not present

## 2018-11-18 DIAGNOSIS — G9341 Metabolic encephalopathy: Secondary | ICD-10-CM | POA: Diagnosis present

## 2018-11-18 DIAGNOSIS — Z515 Encounter for palliative care: Secondary | ICD-10-CM | POA: Diagnosis present

## 2018-11-18 DIAGNOSIS — R739 Hyperglycemia, unspecified: Secondary | ICD-10-CM | POA: Diagnosis present

## 2018-11-18 DIAGNOSIS — N183 Chronic kidney disease, stage 3 (moderate): Secondary | ICD-10-CM | POA: Diagnosis present

## 2018-11-18 DIAGNOSIS — I4891 Unspecified atrial fibrillation: Secondary | ICD-10-CM | POA: Diagnosis present

## 2018-11-18 DIAGNOSIS — I4510 Unspecified right bundle-branch block: Secondary | ICD-10-CM | POA: Diagnosis present

## 2018-11-18 DIAGNOSIS — R410 Disorientation, unspecified: Secondary | ICD-10-CM | POA: Diagnosis present

## 2018-11-18 DIAGNOSIS — E860 Dehydration: Secondary | ICD-10-CM | POA: Diagnosis present

## 2018-11-18 DIAGNOSIS — F0390 Unspecified dementia without behavioral disturbance: Secondary | ICD-10-CM | POA: Diagnosis present

## 2018-11-18 LAB — BASIC METABOLIC PANEL
Anion gap: 14 (ref 5–15)
BUN: 63 mg/dL — ABNORMAL HIGH (ref 8–23)
CO2: 31 mmol/L (ref 22–32)
Calcium: 9.2 mg/dL (ref 8.9–10.3)
Chloride: 100 mmol/L (ref 98–111)
Creatinine, Ser: 2.15 mg/dL — ABNORMAL HIGH (ref 0.44–1.00)
GFR calc Af Amer: 22 mL/min — ABNORMAL LOW (ref >60)
GFR calc non Af Amer: 19 mL/min — ABNORMAL LOW (ref >60)
Glucose, Bld: 105 mg/dL — ABNORMAL HIGH (ref 70–99)
POTASSIUM: 5 mmol/L (ref 3.5–5.1)
Sodium: 145 mmol/L (ref 135–145)

## 2018-11-18 LAB — AMMONIA: Ammonia: 22 umol/L (ref 9–35)

## 2018-11-18 LAB — CBC
HCT: 43.3 % (ref 36.0–46.0)
Hemoglobin: 13.4 g/dL (ref 12.0–15.0)
MCH: 29.3 pg (ref 26.0–34.0)
MCHC: 30.9 g/dL (ref 30.0–36.0)
MCV: 94.5 fL (ref 80.0–100.0)
PLATELETS: 224 10*3/uL (ref 150–400)
RBC: 4.58 MIL/uL (ref 3.87–5.11)
RDW: 15.3 % (ref 11.5–15.5)
WBC: 6.4 10*3/uL (ref 4.0–10.5)
nRBC: 0 % (ref 0.0–0.2)

## 2018-11-18 LAB — TSH: TSH: 2.857 u[IU]/mL (ref 0.350–4.500)

## 2018-11-18 LAB — VITAMIN B12: Vitamin B-12: 1266 pg/mL — ABNORMAL HIGH (ref 180–914)

## 2018-11-18 MED ORDER — HEPARIN SODIUM (PORCINE) 5000 UNIT/ML IJ SOLN
5000.0000 [IU] | Freq: Three times a day (TID) | INTRAMUSCULAR | Status: DC
  Administered 2018-11-18 – 2018-11-21 (×9): 5000 [IU] via SUBCUTANEOUS
  Filled 2018-11-18 (×9): qty 1

## 2018-11-18 MED ORDER — FAMOTIDINE IN NACL 20-0.9 MG/50ML-% IV SOLN
20.0000 mg | INTRAVENOUS | Status: DC
  Administered 2018-11-18 – 2018-11-21 (×4): 20 mg via INTRAVENOUS
  Filled 2018-11-18 (×4): qty 50

## 2018-11-18 MED ORDER — LORAZEPAM 2 MG/ML IJ SOLN
1.0000 mg | Freq: Once | INTRAMUSCULAR | Status: AC | PRN
  Administered 2018-11-18: 1 mg via INTRAVENOUS
  Filled 2018-11-18: qty 1

## 2018-11-18 MED ORDER — VALPROATE SODIUM 500 MG/5ML IV SOLN
250.0000 mg | Freq: Every day | INTRAVENOUS | Status: DC
  Administered 2018-11-18: 250 mg via INTRAVENOUS
  Filled 2018-11-18 (×2): qty 2.5

## 2018-11-18 NOTE — Progress Notes (Signed)
PROGRESS NOTE    Gwendolyn DolphinMargaret J Philson  ZOX:[id number] DOB: October 17, 1923 DOA: 21-Aug-2018 PCP: Willow OraAndy, Camille L, MD   Brief Narrative:  HPI per Dr. Jonah BlueJennifer Yates on 2018/05/03 [name] is a 84 y.o. female with medical history significant of dementia; HTN; HLD; COPD; and IFG presenting with confusion.  She has always been pretty alert, but her sitter during the day (daughter usually sees at least every other day) reported that she seemed to be getting better from recent UTI (seen week before last).  She was acting "not normal" at that time - forgot how to walk, forgot how to use the telephone.  She was started on Septra and she seemed to be improving x 1-2 days.  Then she started going downhill again - would not walk to the dining hall, decreased PO even while being hand fed.  Her daughter was concerned that the Gabapentin was causing her symptoms and stopped this, last dose Friday 1 1/2 weeks ago.  Her daughter called Dr. Mardelle MatteAndy this AM because her sitter got there today and she has marked LE edema.  Marked edema, little appetite, minimal PO intake, confusion and somnolence.  **Remains obtunded and does not follow commands.  Not taking p.o. by mouth and unable to participate in SLP.  Assessment & Plan:   Principal Problem:   Acute metabolic encephalopathy Active Problems:   Essential hypertension   COPD (chronic obstructive pulmonary disease) (HCC)   Other abnormal glucose   Dementia (HCC)   Acute renal failure superimposed on stage 3 chronic kidney disease (HCC)  Acute Metabolic Encephalopathy -Patient presenting with encephalopathy as evidenced by her obtunded/unresponsiveness; Remains obtunded but does withdraw to physical stimulation -She recently had a UTI and has had waxing and waning mental status since then -Evaluation thus far unremarkable other than acute renal failure which is likely related to patient's decreased PO intake and possibly related to recent use of Septra to treat  UTI; Urine Cx last visit showed E Coli; UA this Admit not very indicative of UTI -There is no evidence of active infection at this time -She does take Depakote and recent level was 16 -Check TSH, Ammonia, RPR, and Vitamin B12 -B12 was 1266, TSH was 2.857, and Ammonia Level was 22 -Check MRI w/o Contrast now -Will consider getting EEG and Neurology consult if not improving -Hold all sedating medications but will give 1 mg of IV Lorazepam for MRI; Was getting Benadyl and Fentanyl in the ED which have been stopped  -Will observe for now on telemetry with IVF hydration   Acute renal failure on stage 3 CKD -As noted above, likely associated with decreased PO intake and possibly with recent use of Septra -Will gently hydrate and c/w IVF with LR ar 100 mLhr -Change Enoxaparin to Heparin 5,000 units sq q8h -AMS may be associated with her dehydration -Patient's BUN/Cr went from 62/2.47 -> 63/2.15 -Check SLP -Hold po Medications  -Avoid Nephrotoxic Medications if Possible  Hyperkalemia -Improved. K+ went from 5.3 -> 5.0 -C/w IVF Hydration -Repeat CMP in AM   Dementia -Patient's daughter reports mild dementia -C/w MRI as above  -C/w Divalproex 250 mg po qHS if able to take po and improved  -Delirium Precautions   HTN -Discontinued Amlodipine due to inability to take in po -She was hypotensive upon arrival in the ER  -Likely hypovolemic  COPD -Patient does not appear able to participate in Advair inhalation at this time -DuoNeb stopped and will continue prn Albuterol  Hyperglycemia -May be  stress response -Glucose has been ranging from 105-128 -It is unlikely that she will need acute or chronic treatment for this issue -Will continue to Monitor Closely; Will not check HbA1c at this time  Stage 1 Sacral Ulcer -poA -C/w Frequent turning and Mepilex  DVT prophylaxis: Heparin 5,000 units sq q8h Code Status: DO NOT RESUSCITATE Family Communication: Discussed with  Daughter Disposition Plan: Pending Improvement back to baseline and PT/OT to evaluate and treat  Consultants: None  Procedures:   None   Antimicrobials:  Anti-infectives (From admission, onward)   None     Subjective: Remains obtunded and does not follow commands.  Does withdraw to physical stimuli.  Daughter states that she has been going downhill since last admission.  Patient unable to provide a subjective history due to her current condition.  Nursing states that she is still not able to swallow.  Objective: Vitals:   12-01-18 2113 12-01-18 2330 11/18/18 0319 11/18/18 0735  BP:  (!) 118/58 112/64 (!) 142/76  Pulse: 97 88 69 73  Resp: 16 19 20 20   Temp:  (!) 97.3 F (36.3 C) (!) 97.2 F (36.2 C) (!) 97.5 F (36.4 C)  TempSrc:  Axillary Axillary Axillary  SpO2:  94% 95% 93%  Weight:      Height:       No intake or output data in the 24 hours ending 11/18/18 0743 Filed Weights   01-Dec-2018 1921  Weight: 81.2 kg   Examination: Physical Exam:  Constitutional: Confused and Somnolent Caucasian female who remains altered and obtunded; Does not appear to be in acute distress but does withdraw to physical stimuli Eyes: PERRL, lids and conjunctivae normal, sclerae anicteric  ENMT: External Ears, Nose appear normal. Grossly normal hearing. Mucous membranes are moist. Posterior pharynx clear of any exudate or lesions. Normal dentition.  Neck: Appears normal, supple, no cervical masses, normal ROM, no appreciable thyromegaly Respiratory: Clear to auscultation bilaterally, no wheezing, rales, rhonchi or crackles. Normal respiratory effort and patient is not tachypenic. No accessory muscle use.  Cardiovascular: RRR, no murmurs / rubs / gallops. S1 and S2 auscultated. No extremity edema. 2+ pedal pulses. No carotid bruits.  Abdomen: Soft, non-tender, non-distended. No masses palpated. No appreciable hepatosplenomegaly. Bowel sounds positive.  GU: Deferred. Musculoskeletal: No  clubbing / cyanosis of digits/nails. No joint deformity upper and lower extremities. Good ROM, no contractures. Normal strength and muscle tone.  Skin: No rashes, lesions, ulcers on a limited skin evaluation but has a Stage 1 ulcer that I could not view as I could not turn the patient. No induration; Warm and dry. Daughter states legs were swollen and red before but I don't appreciate any erythema, swelling or warmth.  Neurologic: Does not follow commands but does respond to physical stimuli. Psychiatric: Impaired judgment and insight. Not Alert and oriented x 3.  Data Reviewed: I have personally reviewed following labs and imaging studies  CBC: Recent Labs  Lab 01-Dec-2018 1024 11/18/18 0442  WBC 8.0 6.4  HGB 13.7 13.4  HCT 43.8 43.3  MCV 91.6 94.5  PLT 252 224   Basic Metabolic Panel: Recent Labs  Lab 12-01-18 1024 11/18/18 0442  NA 142 145  K 5.3* 5.0  CL 95* 100  CO2 34* 31  GLUCOSE 128* 105*  BUN 62* 63*  CREATININE 2.47* 2.15*  CALCIUM 9.4 9.2   GFR: Estimated Creatinine Clearance: 18.1 mL/min (A) (by C-G formula based on SCr of 2.15 mg/dL (H)). Liver Function Tests: Recent Labs  Lab 12/01/2018 1024  AST 37  ALT 22  ALKPHOS 90  BILITOT 1.1  PROT 6.9  ALBUMIN 3.5   No results for input(s): LIPASE, AMYLASE in the last 168 hours. No results for input(s): AMMONIA in the last 168 hours. Coagulation Profile: No results for input(s): INR, PROTIME in the last 168 hours. Cardiac Enzymes: No results for input(s): CKTOTAL, CKMB, CKMBINDEX, TROPONINI in the last 168 hours. BNP (last 3 results) No results for input(s): PROBNP in the last 8760 hours. HbA1C: No results for input(s): HGBA1C in the last 72 hours. CBG: No results for input(s): GLUCAP in the last 168 hours. Lipid Profile: No results for input(s): CHOL, HDL, LDLCALC, TRIG, CHOLHDL, LDLDIRECT in the last 72 hours. Thyroid Function Tests: No results for input(s): TSH, T4TOTAL, FREET4, T3FREE, THYROIDAB in the  last 72 hours. Anemia Panel: No results for input(s): VITAMINB12, FOLATE, FERRITIN, TIBC, IRON, RETICCTPCT in the last 72 hours. Sepsis Labs: Recent Labs  Lab 11/08/2018 1100  LATICACIDVEN 1.87    No results found for this or any previous visit (from the past 240 hour(s)).   RN Pressure Injury Documentation: Pressure Ulcer 04/23/16 Stage I -  Intact skin with non-blanchable redness of a localized area usually over a bony prominence. redness with healing scars (Active)  04/23/16 1912  Location: Buttocks  Location Orientation: Right;Left  Staging: Stage I -  Intact skin with non-blanchable redness of a localized area usually over a bony prominence.  Wound Description (Comments): redness with healing scars  Present on Admission: Yes     Radiology Studies: Ct Head Wo Contrast  Result Date: 10/27/2018 CLINICAL DATA:  History of dementia. Worsening mental status. Recent fall. Atrial fibrillation. EXAM: CT HEAD WITHOUT CONTRAST TECHNIQUE: Contiguous axial images were obtained from the base of the skull through the vertex without intravenous contrast. COMPARISON:  04/23/2016. FINDINGS: Brain: No evidence for acute infarction, hemorrhage, mass lesion, hydrocephalus, or extra-axial fluid. Generalized atrophy. Hypoattenuation of white matter, likely small vessel disease. Vascular: Calcification of the cavernous internal carotid arteries consistent with cerebrovascular atherosclerotic disease. No signs of intracranial large vessel occlusion. Skull: Motion degraded, but calvarium grossly intact. Sinuses/Orbits: No acute findings. Other: None. IMPRESSION: Atrophy and small vessel disease. No acute intracranial findings. Similar appearance to priors. Electronically Signed   By: Elsie StainJohn T Curnes M.D.   On: 11/12/2018 11:45   Dg Chest Port 1 View  Result Date: 11/10/2018 CLINICAL DATA:  Confusion, UTI last week. New onset atrial fibrillation EXAM: PORTABLE CHEST 1 VIEW COMPARISON:  08/27/2017 FINDINGS:  There is mild bilateral interstitial thickening. There is no focal parenchymal opacity. There is no pneumothorax. There is a possible trace left pleural effusion. There is stable cardiomegaly. The osseous structures are unremarkable. IMPRESSION: Mild bilateral interstitial thickening likely chronic. Mild superimposed interstitial edema is not excluded. Electronically Signed   By: Elige KoHetal  Patel   On: 11/14/2018 11:01   Scheduled Meds: . amLODipine  5 mg Oral Daily  . aspirin EC  81 mg Oral Daily  . divalproex  250 mg Oral QHS  . docusate sodium  100 mg Oral BID  . enoxaparin (LOVENOX) injection  30 mg Subcutaneous Q24H  . famotidine  20 mg Oral Daily  . fluticasone  2 spray Each Nare Daily  . loratadine  10 mg Oral Daily  . sertraline  50 mg Oral Daily  . sodium chloride flush  3 mL Intravenous Q12H   Continuous Infusions: . lactated ringers 100 mL/hr at 10/30/2018 1856    LOS: 0 days   Kateri Mcmair  Peterson Ao, DO [facility] PAGER is on AMION  If 7PM-7AM, please contact night-coverage [email or web address] Password [facility] 11/18/2018, 7:43 AM

## 2018-11-19 ENCOUNTER — Inpatient Hospital Stay (HOSPITAL_COMMUNITY): Payer: Medicare Other

## 2018-11-19 LAB — COMPREHENSIVE METABOLIC PANEL
ALK PHOS: 92 U/L (ref 38–126)
ALT: 35 U/L (ref 0–44)
AST: 38 U/L (ref 15–41)
Albumin: 3.2 g/dL — ABNORMAL LOW (ref 3.5–5.0)
Anion gap: 10 (ref 5–15)
BUN: 68 mg/dL — ABNORMAL HIGH (ref 8–23)
CO2: 34 mmol/L — AB (ref 22–32)
Calcium: 9.4 mg/dL (ref 8.9–10.3)
Chloride: 101 mmol/L (ref 98–111)
Creatinine, Ser: 1.98 mg/dL — ABNORMAL HIGH (ref 0.44–1.00)
GFR calc Af Amer: 25 mL/min — ABNORMAL LOW (ref >60)
GFR calc non Af Amer: 21 mL/min — ABNORMAL LOW (ref >60)
Glucose, Bld: 132 mg/dL — ABNORMAL HIGH (ref 70–99)
Potassium: 5.4 mmol/L — ABNORMAL HIGH (ref 3.5–5.1)
Sodium: 145 mmol/L (ref 135–145)
Total Bilirubin: 0.4 mg/dL (ref 0.3–1.2)
Total Protein: 6.6 g/dL (ref 6.5–8.1)

## 2018-11-19 LAB — CBC WITH DIFFERENTIAL/PLATELET
ABS IMMATURE GRANULOCYTES: 0.07 10*3/uL (ref 0.00–0.07)
Basophils Absolute: 0 10*3/uL (ref 0.0–0.1)
Basophils Relative: 0 %
Eosinophils Absolute: 0 10*3/uL (ref 0.0–0.5)
Eosinophils Relative: 0 %
HCT: 47.5 % — ABNORMAL HIGH (ref 36.0–46.0)
HEMOGLOBIN: 14.5 g/dL (ref 12.0–15.0)
Immature Granulocytes: 1 %
Lymphocytes Relative: 7 %
Lymphs Abs: 0.6 10*3/uL — ABNORMAL LOW (ref 0.7–4.0)
MCH: 29.5 pg (ref 26.0–34.0)
MCHC: 30.5 g/dL (ref 30.0–36.0)
MCV: 96.5 fL (ref 80.0–100.0)
Monocytes Absolute: 0.8 10*3/uL (ref 0.1–1.0)
Monocytes Relative: 8 %
NEUTROS ABS: 8 10*3/uL — AB (ref 1.7–7.7)
Neutrophils Relative %: 84 %
Platelets: 222 10*3/uL (ref 150–400)
RBC: 4.92 MIL/uL (ref 3.87–5.11)
RDW: 15.6 % — ABNORMAL HIGH (ref 11.5–15.5)
WBC: 9.5 10*3/uL (ref 4.0–10.5)
nRBC: 0 % (ref 0.0–0.2)

## 2018-11-19 LAB — PHOSPHORUS: PHOSPHORUS: 5.7 mg/dL — AB (ref 2.5–4.6)

## 2018-11-19 LAB — BASIC METABOLIC PANEL
Anion gap: 10 (ref 5–15)
Anion gap: 10 (ref 5–15)
BUN: 85 mg/dL — ABNORMAL HIGH (ref 8–23)
BUN: 89 mg/dL — AB (ref 8–23)
CHLORIDE: 103 mmol/L (ref 98–111)
CO2: 33 mmol/L — ABNORMAL HIGH (ref 22–32)
CO2: 33 mmol/L — ABNORMAL HIGH (ref 22–32)
Calcium: 9.3 mg/dL (ref 8.9–10.3)
Calcium: 9.2 mg/dL (ref 8.9–10.3)
Chloride: 103 mmol/L (ref 98–111)
Creatinine, Ser: 2.09 mg/dL — ABNORMAL HIGH (ref 0.44–1.00)
Creatinine, Ser: 2.19 mg/dL — ABNORMAL HIGH (ref 0.44–1.00)
GFR calc Af Amer: 23 mL/min — ABNORMAL LOW (ref >60)
GFR calc Af Amer: 22 mL/min — ABNORMAL LOW (ref >60)
GFR calc non Af Amer: 19 mL/min — ABNORMAL LOW (ref >60)
GFR, EST NON AFRICAN AMERICAN: 20 mL/min — AB (ref >60)
Glucose, Bld: 141 mg/dL — ABNORMAL HIGH (ref 70–99)
Glucose, Bld: 142 mg/dL — ABNORMAL HIGH (ref 70–99)
POTASSIUM: 5.6 mmol/L — AB (ref 3.5–5.1)
POTASSIUM: 5.7 mmol/L — AB (ref 3.5–5.1)
SODIUM: 146 mmol/L — AB (ref 135–145)
Sodium: 146 mmol/L — ABNORMAL HIGH (ref 135–145)

## 2018-11-19 LAB — MAGNESIUM: Magnesium: 2.8 mg/dL — ABNORMAL HIGH (ref 1.7–2.4)

## 2018-11-19 LAB — HIV ANTIBODY (ROUTINE TESTING W REFLEX): HIV Screen 4th Generation wRfx: NONREACTIVE

## 2018-11-19 LAB — RPR: RPR Ser Ql: NONREACTIVE

## 2018-11-19 MED ORDER — SODIUM BICARBONATE 8.4 % IV SOLN
50.0000 meq | Freq: Once | INTRAVENOUS | Status: AC
  Administered 2018-11-19: 50 meq via INTRAVENOUS
  Filled 2018-11-19: qty 50

## 2018-11-19 MED ORDER — DEXTROSE 50 % IV SOLN
1.0000 | Freq: Once | INTRAVENOUS | Status: AC
  Administered 2018-11-19: 50 mL via INTRAVENOUS
  Filled 2018-11-19: qty 50

## 2018-11-19 MED ORDER — INSULIN ASPART 100 UNIT/ML IV SOLN
5.0000 [IU] | Freq: Once | INTRAVENOUS | Status: AC
  Administered 2018-11-19: 5 [IU] via INTRAVENOUS

## 2018-11-19 MED ORDER — POTASSIUM CL IN DEXTROSE 5% 20 MEQ/L IV SOLN
20.0000 meq | INTRAVENOUS | Status: DC
  Filled 2018-11-19: qty 1000

## 2018-11-19 MED ORDER — DEXTROSE 5 % IV SOLN
INTRAVENOUS | Status: DC
  Administered 2018-11-19 – 2018-11-20 (×2): via INTRAVENOUS

## 2018-11-19 MED ORDER — SODIUM CHLORIDE 0.9 % IV BOLUS
500.0000 mL | Freq: Once | INTRAVENOUS | Status: AC
  Administered 2018-11-19: 500 mL via INTRAVENOUS

## 2018-11-19 NOTE — Progress Notes (Signed)
Patient remained lethargic,does not follow command ,occssionally  Become restless   and pulls off tele leads and attempt to get oob. Assist with repositioning   Bi l mitten applied to prevent pt from pulling off medical devices.[name]

## 2018-11-19 NOTE — Progress Notes (Signed)
EEG completed, results pending. 

## 2018-11-19 NOTE — Progress Notes (Signed)
Attempted straight cath for urine sample. Unsuccessful attempt.

## 2018-11-19 NOTE — Procedures (Signed)
History: 85 year old female being evaluated for encephalopathy  Sedation: None  Technique: This is a 21 channel routine scalp EEG performed at the bedside with bipolar and monopolar montages arranged in accordance to the international 10/20 system of electrode placement. One channel was dedicated to EKG recording.    Background: The there is a posterior dominant rhythm of 7 to 8 Hz which is moderately well sustained.  In addition there is intrusion of delta and theta activities in a diffuse pattern throughout the study even during maximal wakefulness.  There are occasional poorly formed discharges with a shifting bifrontal predominance and triphasic morphology.  Sleep was recorded with symmetric structures.  Photic stimulation: Physiologic driving is not performed  EEG Abnormalities: 1) occasional triphasic waves 2) generalized irregular slow activity 3) slow posterior dominant rhythm  Clinical Interpretation: This EEG is consistent with a generalized nonspecific cerebral dysfunction(encephalopathy).  Though nonspecific, triphasic waves can be associated with toxic/metabolic encephalopathy.  There was no seizure or seizure predisposition recorded on this study. Please note that lack of epileptiform activity on EEG does not preclude the possibility of epilepsy.   Ritta Slot, MD [facility] [phone]  If 7pm- 7am, please page neurology on call as listed in AMION.

## 2018-11-19 NOTE — Progress Notes (Signed)
PROGRESS NOTE    [name]  ZOX:[id number] DOB: 1923-11-11 DOA: 12/13/2018 PCP: Willow Ora, MD   Brief Narrative:  HPI per Dr. Jonah Blue on Dec 13, 2018 [name] is a 85 y.o. female with medical history significant of dementia; HTN; HLD; COPD; and IFG presenting with confusion.  She has always been pretty alert, but her sitter during the day (daughter usually sees at least every other day) reported that she seemed to be getting better from recent UTI (seen week before last).  She was acting "not normal" at that time - forgot how to walk, forgot how to use the telephone.  She was started on Septra and she seemed to be improving x 1-2 days.  Then she started going downhill again - would not walk to the dining hall, decreased PO even while being hand fed.  Her daughter was concerned that the Gabapentin was causing her symptoms and stopped this, last dose Friday 1 1/2 weeks ago.  Her daughter called Dr. Mardelle Matte this AM because her sitter got there today and she has marked LE edema.  Marked edema, little appetite, minimal PO intake, confusion and somnolence.  **Remains obtunded and does not follow commands.  Not taking p.o. by mouth and unable to participate in SLP. Discussed with Neuro and they recommend continuing to correct Renal Function. If Renal Fxn is improved and she is still encephalopathic they will evaluate.   Assessment & Plan:   Principal Problem:   Acute metabolic encephalopathy Active Problems:   Essential hypertension   COPD (chronic obstructive pulmonary disease) (HCC)   Other abnormal glucose   Dementia (HCC)   Acute renal failure superimposed on stage 3 chronic kidney disease (HCC)  Acute Metabolic Encephalopathy -Patient presenting with encephalopathy as evidenced by her obtunded/unresponsiveness; Remains obtunded but does withdraw to physical stimulation -She recently had a UTI and has had waxing and waning mental status since then -Evaluation thus far  unremarkable other than acute renal failure which is likely related to patient's decreased PO intake and possibly related to recent use of Septra to treat UTI; Urine Cx last visit showed E Coli; UA this Admit not very indicative of UTI -There is no evidence of active infection at this time -She does take Depakote and recent level was 16 -Checked TSH, Ammonia, RPR, and Vitamin B12 -B12 was 1266, TSH was 2.857, and Ammonia Level was 22 and RPR Negative  -Checked MRI w/o Contrast and showed Moderate to Advance Motion degradation without acute finding and a temporal predominant atrophy and moderate chronic small vessel ischemia  -EEG done and showed occasional triphasic waves, generalized irregular slow activity, and slow posterior dominant rhythm.  The EEG was consistent with a generalized nonspecific cerebral dysfunction/encephalopathy though is nonspecific to try phasic waves could be associated with toxic/metabolic encephalopathy -Hold all sedating medications but will give 1 mg of IV Lorazepam for MRI; Was getting Benadyl and Fentanyl in the ED which have been stopped and Also stopped Zoloft, Depacon, and Loratidine  -Will observe for now on telemetry with IVF hydration -Cussed the case with neurology Dr. Wilford Corner and he recommends correcting renal function.  If renal function corrects to close to baseline she is still encephalopathic neurology will formally conslut   Acute renal failure on stage 3 CKD -As noted above, likely associated with decreased PO intake and possibly with recent use of Septra -Gently hydrate and c/w IVF with LR ar 100 mLhr but this has stopped and changed to D5W at 75 mL/hr  given her Hypernatermia -Change Enoxaparin to Heparin 5,000 units sq q8h -AMS may be associated with her dehydration -Patient's BUN/Cr went from 62/2.47 -> 63/2.15 -> 68/1.98 -> 85/2.09 -Check SLP when able -Hold po Medications  -Avoid Nephrotoxic Medications if Possible  Hyperkalemia -Improved. K+  went from 5.3 -> 5.0 -> 5.6 -Gave Temporazing measures and given 1 AMP of Sodium Bicarbonate, 5 units of insulin and an amp of D50 -C/w IVF Hydration as above  -Repeat CMP in AM   Hypernatremia -We will start the patient on D5 W at 75 mL's per hour and recheck in a.m.  Dementia -Patient's daughter reports mild dementia -C/w MRI as above  -Divalproex 250 mg po qHS has now been discontinued -Delirium Precautions   HTN -Discontinued Amlodipine due to inability to take in po -She was hypotensive upon arrival in the ER  -Likely hypovolemic so will continue IVF as above  COPD -Patient does not appear able to participate in Advair inhalation at this time -DuoNeb stopped and will continue prn Albuterol  Hyperglycemia -May be stress response but likley to go up since starting D5W and an AMP of D50 -Glucose has been ranging from 105-141 -It is unlikely that she will need acute or chronic treatment for this issue -Will continue to Monitor Closely; Will not check HbA1c at this time  Stage 1 Sacral Ulcer -poA -C/w Frequent turning and Mepilex  DVT prophylaxis: Heparin 5,000 units sq q8h Code Status: DO NOT RESUSCITATE Family Communication: Discussed with Daughter Disposition Plan: Pending Improvement back to baseline and PT/OT to evaluate and treat  Consultants: None  Procedures:   None   Antimicrobials:  Anti-infectives (From admission, onward)   None     Subjective: He is remained obtunded and is very difficult to arouse and she does not arouse to physical or verbal stimuli.  She grimaces and withdraws in pain.  Daughter states that she woke up slightly and stated her name but she is sleeping.  No other concerns or complaints at this time per daughter  Objective: Vitals:   11/19/18 0012 11/19/18 0448 11/19/18 0744 11/19/18 1311  BP: 121/82 (!) 137/91 132/65 (!) 141/75  Pulse: (!) 58 73 (!) 115 (!) 121  Resp: 18 20 (!) 30 (!) 24  Temp: 98.1 F (36.7 C) 98.1 F  (36.7 C) 97.7 F (36.5 C) 98.1 F (36.7 C)  TempSrc: Oral  Axillary Oral  SpO2: 93% 99% 91% 99%  Weight:      Height:        Intake/Output Summary (Last 24 hours) at 11/19/2018 1444 Last data filed at 11/19/2018 0000 Gross per 24 hour  Intake 1250 ml  Output -  Net 1250 ml   Filed Weights   11/09/2018 1921  Weight: 81.2 kg   Examination: Physical Exam:  Constitutional: Confused and somnolent again and she remains altered and obtunded.  Does not appear to be in any acute distress but does withdraw the sternal rub and physical stimuli Eyes: Lids and conjunctive are normal.  Sclera anicteric ENMT: External ears and nose appear normal. Neck: Appears normal with no appreciable thyromegaly or JVD Respiratory: Diminished to auscultation bilaterally no appreciable wheezing, rales, rhonchi.  Patient not tachypneic or using accessory muscles to breathe Cardiovascular: Regular rate and rhythm.  No appreciable murmurs, rubs or gallops.  No appreciable lower extremity edema Abdomen: Soft, nontender, nondistended.  Bowel sounds present GU: Deferred Musculoskeletal: No appreciable contractures or cyanosis.  No joint deformities noted Skin: No appreciable rashes or lesions on  limited skin evaluation but does have a stage I ulcer that I could not view againas I cannot turn the patient.  Legs are not swollen and I do not appreciate any erythema Neurologic: Does not follow commands but does respond to physical stimuli and withdraws from physical pain Psychiatric: Impaired judgment and insight.  She is not awake or alert and oriented x3  Data Reviewed: I have personally reviewed following labs and imaging studies  CBC: Recent Labs  Lab 14-Jun-2018 1024 11/18/18 0442 11/19/18 0438  WBC 8.0 6.4 9.5  NEUTROABS  --   --  8.0*  HGB 13.7 13.4 14.5  HCT 43.8 43.3 47.5*  MCV 91.6 94.5 96.5  PLT 252 224 222   Basic Metabolic Panel: Recent Labs  Lab 14-Jun-2018 1024 11/18/18 0442 11/19/18 0438  11/19/18 1244  NA 142 145 145 146*  K 5.3* 5.0 5.4* 5.6*  CL 95* 100 101 103  CO2 34* 31 34* 33*  GLUCOSE 128* 105* 132* 141*  BUN 62* 63* 68* 85*  CREATININE 2.47* 2.15* 1.98* 2.09*  CALCIUM 9.4 9.2 9.4 9.3  MG  --   --  2.8*  --   PHOS  --   --  5.7*  --    GFR: Estimated Creatinine Clearance: 18.6 mL/min (A) (by C-G formula based on SCr of 2.09 mg/dL (H)). Liver Function Tests: Recent Labs  Lab 14-Jun-2018 1024 11/19/18 0438  AST 37 38  ALT 22 35  ALKPHOS 90 92  BILITOT 1.1 0.4  PROT 6.9 6.6  ALBUMIN 3.5 3.2*   No results for input(s): LIPASE, AMYLASE in the last 168 hours. Recent Labs  Lab 11/18/18 1210  AMMONIA 22   Coagulation Profile: No results for input(s): INR, PROTIME in the last 168 hours. Cardiac Enzymes: No results for input(s): CKTOTAL, CKMB, CKMBINDEX, TROPONINI in the last 168 hours. BNP (last 3 results) No results for input(s): PROBNP in the last 8760 hours. HbA1C: No results for input(s): HGBA1C in the last 72 hours. CBG: No results for input(s): GLUCAP in the last 168 hours. Lipid Profile: No results for input(s): CHOL, HDL, LDLCALC, TRIG, CHOLHDL, LDLDIRECT in the last 72 hours. Thyroid Function Tests: Recent Labs    11/18/18 1210  TSH 2.857   Anemia Panel: Recent Labs    11/18/18 1210  VITAMINB12 1,266*   Sepsis Labs: Recent Labs  Lab 14-Jun-2018 1100  LATICACIDVEN 1.87    No results found for this or any previous visit (from the past 240 hour(s)).   RN Pressure Injury Documentation: Pressure Ulcer 04/23/16 Stage I -  Intact skin with non-blanchable redness of a localized area usually over a bony prominence. redness with healing scars (Active)  04/23/16 1912  Location: Buttocks  Location Orientation: Right;Left  Staging: Stage I -  Intact skin with non-blanchable redness of a localized area usually over a bony prominence.  Wound Description (Comments): redness with healing scars  Present on Admission: Yes     Radiology  Studies: Mr Brain 41Wo Contrast  Result Date: 11/18/2018 CLINICAL DATA:  Altered level of consciousness, unexplained EXAM: MRI HEAD WITHOUT CONTRAST TECHNIQUE: Multiplanar, multiecho pulse sequences of the brain and surrounding structures were obtained without intravenous contrast. COMPARISON:  Head CT from yesterday FINDINGS: Brain: No acute infarction, hemorrhage, hydrocephalus, extra-axial collection or mass masslike finding. Cerebral volume loss most notable in the temporal lobes. Patient has history of dementia and this may reflect Alzheimer's disease. Moderate chronic small vessel ischemia in the cerebral white matter. Vascular: Major flow voids  are preserved Skull and upper cervical spine: Negative for marrow lesion Sinuses/Orbits: Bilateral cataract resection. Other: Persistent and at times significantly motion degraded exam. IMPRESSION: 1. Moderate to advanced motion degradation without acute finding. 2. Temporal predominant atrophy and moderate chronic small vessel ischemia. Electronically Signed   By: Marnee Spring M.D.   On: 11/18/2018 16:54   Scheduled Meds: . dextrose  1 ampule Intravenous Once  . docusate sodium  100 mg Oral BID  . fluticasone  2 spray Each Nare Daily  . heparin injection (subcutaneous)  5,000 Units Subcutaneous Q8H  . insulin aspart  5 Units Intravenous Once  . sodium bicarbonate  50 mEq Intravenous Once  . sodium chloride flush  3 mL Intravenous Q12H   Continuous Infusions: . dextrose    . famotidine (PEPCID) IV 20 mg (11/19/18 1214)    LOS: 1 day   Merlene Laughter, DO [facility] PAGER is on AMION  If 7PM-7AM, please contact night-coverage [email or web address] Password [facility] 11/19/2018, 2:44 PM

## 2018-11-19 NOTE — Progress Notes (Signed)
P.t. cardiac monitoring order is expired. MD paged.

## 2018-11-19 NOTE — Evaluation (Signed)
Physical Therapy Evaluation Patient Details Name: [name] MRN: [id number] DOB: Jul 07, 1923 Today's Date: 11/19/2018   History of Present Illness  [name] is a 85 y.o. female with medical history significant of dementia; HTN; HLD; COPD; and IFG presenting with confusion/declining mental statusacute metabolic encepholopathy (of current unknown cause, medical and neuro workup in progress), EEG consistent with encephalopathy, acute on chronic renal failure, hyperkalemia, and stage 1 sacral ulcer.    Clinical Impression  PT/OT co assessed pt and even with EOB mobility (total assist) pt did not arouse or open her eyes.  The only reaction we could get was some facial grimacing with painful stimuli to her right upper extremity only.  We did not see any spontaneous movement of her limbs, however, ROM seems to be preserved at this time.  She would be appropriate for SNF placement at discharge and possibly hospice if she is unable to rally from her current state.   PT to follow acutely for deficits listed below.    Of note, HR increased to 180 seated EOB (up from low 100s-110s at rest).     Follow Up Recommendations SNF;Other (comment)(hospice)    Equipment Recommendations  Hospital bed;Wheelchair (measurements PT);Wheelchair cushion (measurements PT);Other (comment)(hoyer lift)    Recommendations for Other Services   NA    Precautions / Restrictions Precautions Precautions: Fall;Other (comment) Precaution Comments: monitor HR       Mobility  Bed Mobility Overal bed mobility: Needs Assistance Bed Mobility: Rolling;Supine to Sit;Sit to Supine Rolling: Total assist;+2 for physical assistance   Supine to sit: +2 for physical assistance;Total assist;HOB elevated Sit to supine: Total assist;+2 for physical assistance;HOB elevated   General bed mobility comments: Total assist with all mobility, no spontaneous movement observed.       Modified Rankin (Stroke Patients  Only) Modified Rankin (Stroke Patients Only) Pre-Morbid Rankin Score: Moderate disability Modified Rankin: Severe disability     Balance Overall balance assessment: Needs assistance Sitting-balance support: Feet supported;Bilateral upper extremity supported Sitting balance-Leahy Scale: Zero Sitting balance - Comments: no trunk activation in sitting, cervical spine flexed forward on chest, therapist had to support head in sitting.  No active trunk activation observed.  We sat less than 5 mins as pt did not respond and HR increased from 110s-180.  Postural control: Other (comment)(slumpped)                                   Pertinent Vitals/Pain Pain Assessment: Faces Faces Pain Scale: No hurt    Home Living Family/patient expects to be discharged to:: Assisted living                 Additional Comments: from chart review it sounds like she has aids at ALF and was ambulatory with RW until just recently.     Prior Function Level of Independence: Needs assistance   Gait / Transfers Assistance Needed: ambulates with RW              Extremity/Trunk Assessment   Upper Extremity Assessment Upper Extremity Assessment: Defer to OT evaluation    Lower Extremity Assessment Lower Extremity Assessment: RLE deficits/detail;LLE deficits/detail RLE Deficits / Details: no active movement observed in either leg, however, ROM is intact in ankle, knee, and hip.  LLE Deficits / Details: no active movement observed in either leg, however, ROM is intact in ankle, knee, and hip.     Cervical / Trunk Assessment  Cervical / Trunk Assessment: Other exceptions Cervical / Trunk Exceptions: forward head, unable to lay flat without pillows, no active cervical activation in sitting, chin to chest, therapist had to hold head up.   Communication      Cognition Arousal/Alertness: Lethargic Behavior During Therapy: Flat affect Overall Cognitive Status: Impaired/Different from  baseline Area of Impairment: Following commands;Attention                   Current Attention Level: (none)   Following Commands: (unable)       General Comments: PT is very letahrgic, responding only to painful stimuli in her right upper extremity (grimacing, no actual movement to move arm away from stimulus).              Assessment/Plan    PT Assessment Patient needs continued PT services  PT Problem List Decreased strength;Decreased range of motion;Decreased activity tolerance;Decreased balance;Decreased mobility;Decreased coordination;Decreased cognition;Decreased knowledge of use of DME;Decreased safety awareness;Decreased knowledge of precautions;Cardiopulmonary status limiting activity       PT Treatment Interventions DME instruction;Gait training;Functional mobility training;Therapeutic activities;Therapeutic exercise;Balance training;Neuromuscular re-education;Cognitive remediation;Patient/family education    PT Goals (Current goals can be found in the Care Plan section)  Acute Rehab PT Goals Patient Stated Goal: unable to state, was non verbal during our session PT Goal Formulation: Patient unable to participate in goal setting Time For Goal Achievement: 12/03/18 Potential to Achieve Goals: Good    Frequency Min 2X/week        Co-evaluation PT/OT/SLP Co-Evaluation/Treatment: Yes Reason for Co-Treatment: Complexity of the patient's impairments (multi-system involvement);Necessary to address cognition/behavior during functional activity;For patient/therapist safety;To address functional/ADL transfers PT goals addressed during session: Mobility/safety with mobility;Balance;Strengthening/ROM         AM-PAC PT "6 Clicks" Mobility  Outcome Measure Help needed turning from your back to your side while in a flat bed without using bedrails?: Total Help needed moving from lying on your back to sitting on the side of a flat bed without using bedrails?:  Total Help needed moving to and from a bed to a chair (including a wheelchair)?: Total Help needed standing up from a chair using your arms (e.g., wheelchair or bedside chair)?: Total Help needed to walk in hospital room?: Total Help needed climbing 3-5 steps with a railing? : Total 6 Click Score: 6    End of Session   Activity Tolerance: Patient limited by lethargy Patient left: in bed;with call bell/phone within reach;with bed alarm set;with restraints reapplied(mittens re-applied) Nurse Communication: Mobility status;Other (comment)(HR increase) PT Visit Diagnosis: Muscle weakness (generalized) (M62.81);Difficulty in walking, not elsewhere classified (R26.2);Adult, failure to thrive (R62.7)    Time: 0383-3383 PT Time Calculation (min) (ACUTE ONLY): 13 min   Charges:         Lurena Joiner B. Juergen Hardenbrook, PT, DPT  Acute Rehabilitation #[phone] pager #[phone] office   PT Evaluation $PT Eval Moderate Complexity: 1 Mod          11/19/2018, 1:41 PM

## 2018-11-19 NOTE — Evaluation (Signed)
Occupational Therapy Evaluation Patient Details Name: [name] MRN: [id number] DOB: Apr 17, 1923 Today's Date: 11/19/2018    History of Present Illness [name] is a 85 y.o. female with medical history significant of dementia; HTN; HLD; COPD; and IFG presenting with confusion/declining mental statusacute metabolic encepholopathy (of current unknown cause, medical and neuro workup in progress), EEG consistent with encephalopathy, acute on chronic renal failure, hyperkalemia, and stage 1 sacral ulcer.     Clinical Impression   PT/OT co assessed pt and even with sternal rub, loud calling, and EOB mobility (total assist +2) pt did not arouse or open her eyes.  The only reaction we could get was some facial grimacing with painful stimuli to her right upper extremity only.  We did not see any spontaneous movement of her limbs, however, ROM seems to be preserved at this time.  She would be appropriate for SNF placement at discharge and possibly hospice if she is unable to rally from her current state.   Currently recommend Palliative consult regardless. OT will follow acutely.     Follow Up Recommendations  SNF    Equipment Recommendations  None recommended by OT(defer to next venue)    Recommendations for Other Services Other (comment)(Palliative Consult)     Precautions / Restrictions Precautions Precautions: Fall;Other (comment) Precaution Comments: monitor HR       Mobility Bed Mobility Overal bed mobility: Needs Assistance Bed Mobility: Rolling;Supine to Sit;Sit to Supine Rolling: Total assist;+2 for physical assistance   Supine to sit: +2 for physical assistance;Total assist;HOB elevated Sit to supine: Total assist;+2 for physical assistance;HOB elevated   General bed mobility comments: Total assist with all mobility, no spontaneous movement observed.   Transfers                      Balance Overall balance assessment: Needs assistance Sitting-balance  support: Feet supported;Bilateral upper extremity supported Sitting balance-Leahy Scale: Zero Sitting balance - Comments: no trunk activation in sitting, cervical spine flexed forward on chest, therapist had to support head in sitting.  No active trunk activation observed.  We sat less than 5 mins as pt did not respond and HR increased from 110s-180.  Postural control: Other (comment)(slumped)                                 ADL either performed or assessed with clinical judgement   ADL Overall ADL's : Needs assistance/impaired                                       General ADL Comments: total A at this time     Vision         Perception     Praxis      Pertinent Vitals/Pain Pain Assessment: Faces Faces Pain Scale: No hurt     Hand Dominance Right(gleaned from chart review)   Extremity/Trunk Assessment Upper Extremity Assessment Upper Extremity Assessment: RUE deficits/detail;LUE deficits/detail RUE Deficits / Details: grimaces to deep nailbed pressure - no withdrawal or active movement LUE Deficits / Details: no active movement   Lower Extremity Assessment Lower Extremity Assessment: Defer to PT evaluation RLE Deficits / Details: no active movement observed in either leg, however, ROM is intact in ankle, knee, and hip.  LLE Deficits / Details: no active movement observed in either leg, however,  ROM is intact in ankle, knee, and hip.    Cervical / Trunk Assessment Cervical / Trunk Assessment: Other exceptions Cervical / Trunk Exceptions: forward head, unable to lay flat without pillows, no active cervical activation in sitting, chin to chest, therapist had to hold head up.    Communication Communication Communication: HOH   Cognition Arousal/Alertness: Lethargic Behavior During Therapy: Flat affect Overall Cognitive Status: Impaired/Different from baseline Area of Impairment: Following commands;Attention                    Current Attention Level: (none)   Following Commands: (unable)       General Comments: PT is very lethargic, responding only to painful stimuli in her right upper extremity (grimacing, no actual movement to move arm away from stimulus).    General Comments  HR elevated from 120's supine to 180's sitting EOB    Exercises     Shoulder Instructions      Home Living Family/patient expects to be discharged to:: Assisted living                                 Additional Comments: from chart review it sounds like she has aids at ALF and was ambulatory with RW until just recently.       Prior Functioning/Environment Level of Independence: Needs assistance  Gait / Transfers Assistance Needed: ambulates with RW              OT Problem List: Decreased activity tolerance;Impaired balance (sitting and/or standing);Decreased cognition;Cardiopulmonary status limiting activity      OT Treatment/Interventions: Self-care/ADL training;Manual therapy;Therapeutic activities;Patient/family education;Balance training    OT Goals(Current goals can be found in the care plan section) Acute Rehab OT Goals Patient Stated Goal: unable to state, was non verbal during our session ADL Goals Pt Will Perform Grooming: with mod assist;sitting Additional ADL Goal #1: Pt will perform bed mobility at mod A +2 assist as precursor to participating in ADL activity Additional ADL Goal #2: Pt will perform seated balance with mod A with BUE support for participation for ADL for 3-5 min  OT Frequency: Min 1X/week   Barriers to D/C:            Co-evaluation PT/OT/SLP Co-Evaluation/Treatment: Yes Reason for Co-Treatment: Complexity of the patient's impairments (multi-system involvement);Necessary to address cognition/behavior during functional activity;For patient/therapist safety;To address functional/ADL transfers PT goals addressed during session: Mobility/safety with  mobility;Balance;Strengthening/ROM OT goals addressed during session: ADL's and self-care;Strengthening/ROM      AM-PAC OT "6 Clicks" Daily Activity     Outcome Measure Help from another person eating meals?: Total Help from another person taking care of personal grooming?: Total Help from another person toileting, which includes using toliet, bedpan, or urinal?: Total Help from another person bathing (including washing, rinsing, drying)?: Total Help from another person to put on and taking off regular upper body clothing?: Total Help from another person to put on and taking off regular lower body clothing?: Total 6 Click Score: 6   End of Session Equipment Utilized During Treatment: Oxygen Nurse Communication: Mobility status  Activity Tolerance: Patient limited by lethargy Patient left: in bed;with call bell/phone within reach;with bed alarm set  OT Visit Diagnosis: Adult, failure to thrive (R62.7)                Time: 5686-1683 OT Time Calculation (min): 13 min Charges:  OT General Charges $OT Visit: 1 Visit  OT Evaluation $OT Eval Moderate Complexity: 1 Mod  Sherryl MangesLaura Jlynn Ly OTR/L Acute Rehabilitation Services Pager: [phone] Office: [phone]  Evern BioLaura J Eitan Doubleday 11/19/2018, 2:40 PM

## 2018-11-19 NOTE — Progress Notes (Signed)
Bladder Scan performed, 98 ml of urine detected.

## 2018-11-19 DEATH — deceased

## 2018-11-20 ENCOUNTER — Inpatient Hospital Stay (HOSPITAL_COMMUNITY): Payer: Medicare Other

## 2018-11-20 DIAGNOSIS — R401 Stupor: Secondary | ICD-10-CM

## 2018-11-20 DIAGNOSIS — F0390 Unspecified dementia without behavioral disturbance: Secondary | ICD-10-CM

## 2018-11-20 LAB — COMPREHENSIVE METABOLIC PANEL
ALK PHOS: 87 U/L (ref 38–126)
ALT: 54 U/L — ABNORMAL HIGH (ref 0–44)
AST: 35 U/L (ref 15–41)
Albumin: 2.9 g/dL — ABNORMAL LOW (ref 3.5–5.0)
Anion gap: 9 (ref 5–15)
BUN: 80 mg/dL — ABNORMAL HIGH (ref 8–23)
CO2: 35 mmol/L — AB (ref 22–32)
Calcium: 8.9 mg/dL (ref 8.9–10.3)
Chloride: 100 mmol/L (ref 98–111)
Creatinine, Ser: 2.45 mg/dL — ABNORMAL HIGH (ref 0.44–1.00)
GFR calc Af Amer: 19 mL/min — ABNORMAL LOW (ref >60)
GFR calc non Af Amer: 16 mL/min — ABNORMAL LOW (ref >60)
Glucose, Bld: 180 mg/dL — ABNORMAL HIGH (ref 70–99)
Potassium: 5.4 mmol/L — ABNORMAL HIGH (ref 3.5–5.1)
Sodium: 144 mmol/L (ref 135–145)
Total Bilirubin: 0.4 mg/dL (ref 0.3–1.2)
Total Protein: 6.7 g/dL (ref 6.5–8.1)

## 2018-11-20 LAB — CBC WITH DIFFERENTIAL/PLATELET
Abs Immature Granulocytes: 0.2 10*3/uL — ABNORMAL HIGH (ref 0.00–0.07)
Basophils Absolute: 0 10*3/uL (ref 0.0–0.1)
Basophils Relative: 0 %
Eosinophils Absolute: 0 10*3/uL (ref 0.0–0.5)
Eosinophils Relative: 0 %
HCT: 50.9 % — ABNORMAL HIGH (ref 36.0–46.0)
Hemoglobin: 14.4 g/dL (ref 12.0–15.0)
Immature Granulocytes: 2 %
LYMPHS PCT: 5 %
Lymphs Abs: 0.6 10*3/uL — ABNORMAL LOW (ref 0.7–4.0)
MCH: 28 pg (ref 26.0–34.0)
MCHC: 28.3 g/dL — ABNORMAL LOW (ref 30.0–36.0)
MCV: 98.8 fL (ref 80.0–100.0)
MONO ABS: 1.3 10*3/uL — AB (ref 0.1–1.0)
Monocytes Relative: 11 %
Neutro Abs: 9.3 10*3/uL — ABNORMAL HIGH (ref 1.7–7.7)
Neutrophils Relative %: 82 %
Platelets: 195 10*3/uL (ref 150–400)
RBC: 5.15 MIL/uL — ABNORMAL HIGH (ref 3.87–5.11)
RDW: 15.4 % (ref 11.5–15.5)
WBC: 11.4 10*3/uL — AB (ref 4.0–10.5)
nRBC: 0.5 % — ABNORMAL HIGH (ref 0.0–0.2)

## 2018-11-20 LAB — URINALYSIS, ROUTINE W REFLEX MICROSCOPIC
Bilirubin Urine: NEGATIVE
Glucose, UA: NEGATIVE mg/dL
HGB URINE DIPSTICK: NEGATIVE
Ketones, ur: NEGATIVE mg/dL
Leukocytes, UA: NEGATIVE
Nitrite: NEGATIVE
Protein, ur: NEGATIVE mg/dL
Specific Gravity, Urine: 1.014 (ref 1.005–1.030)
pH: 5 (ref 5.0–8.0)

## 2018-11-20 LAB — MAGNESIUM: Magnesium: 2.9 mg/dL — ABNORMAL HIGH (ref 1.7–2.4)

## 2018-11-20 LAB — SODIUM, URINE, RANDOM

## 2018-11-20 LAB — PHOSPHORUS: Phosphorus: 6.3 mg/dL — ABNORMAL HIGH (ref 2.5–4.6)

## 2018-11-20 LAB — NA AND K (SODIUM & POTASSIUM), RAND UR
Potassium Urine: 39 mmol/L
Sodium, Ur: <10 mmol/L

## 2018-11-20 LAB — CREATININE, URINE, RANDOM: Creatinine, Urine: 164.99 mg/dL

## 2018-11-20 LAB — OSMOLALITY, URINE: OSMOLALITY UR: 410 mosm/kg (ref 300–900)

## 2018-11-20 MED ORDER — SODIUM CHLORIDE 0.9 % IV BOLUS
1000.0000 mL | Freq: Once | INTRAVENOUS | Status: AC
  Administered 2018-11-20: 1000 mL via INTRAVENOUS

## 2018-11-20 MED ORDER — WHITE PETROLATUM EX OINT
TOPICAL_OINTMENT | CUTANEOUS | Status: AC
  Administered 2018-11-20: 0.2
  Filled 2018-11-20: qty 28.35

## 2018-11-20 MED ORDER — SODIUM CHLORIDE 0.9 % IV SOLN
INTRAVENOUS | Status: DC
  Administered 2018-11-20 – 2018-11-21 (×2): via INTRAVENOUS

## 2018-11-20 NOTE — Progress Notes (Signed)
PROGRESS NOTE    [name]  OEH:[id number] DOB: [birth year] DOA: 11/01/2018 PCP: Willow Ora, MD   Brief Narrative:  HPI per Dr. Jonah Blue on 11/08/2018 [name] is a 85 y.o. female with medical history significant of dementia; HTN; HLD; COPD; and IFG presenting with confusion.  She has always been pretty alert, but her sitter during the day (daughter usually sees at least every other day) reported that she seemed to be getting better from recent UTI (seen week before last).  She was acting "not normal" at that time - forgot how to walk, forgot how to use the telephone.  She was started on Septra and she seemed to be improving x 1-2 days.  Then she started going downhill again - would not walk to the dining hall, decreased PO even while being hand fed.  Her daughter was concerned that the Gabapentin was causing her symptoms and stopped this, last dose Friday 1 1/2 weeks ago.  Her daughter called Dr. Mardelle Matte this AM because her sitter got there today and she has marked LE edema.  Marked edema, little appetite, minimal PO intake, confusion and somnolence.  **Remains obtunded and does not follow commands again.  Not taking p.o. by mouth and unable to participate in SLP. Discussed with Neuro and they recommend continuing to correct Renal Function. If Renal Fxn is improved and she is still encephalopathic they will evaluate. Nephrology consulted due to worsening Cr  Assessment & Plan:   Principal Problem:   Acute metabolic encephalopathy Active Problems:   Essential hypertension   COPD (chronic obstructive pulmonary disease) (HCC)   Other abnormal glucose   Dementia (HCC)   Acute renal failure superimposed on stage 3 chronic kidney disease (HCC)  Acute Metabolic Encephalopathy -Patient presenting with encephalopathy as evidenced by her obtunded/unresponsiveness; Remains obtunded and does not withdraw to physical stimuli today -She recently had a UTI and has had waxing and  waning mental status since then -Evaluation thus far unremarkable other than acute renal failure which is likely related to patient's decreased PO intake and possibly related to recent use of Septra to treat UTI; Urine Cx last visit showed E Coli; UA this Admit not very indicative of UTI -There is no evidence of active infection at this time and she is afebrile but WBC is mildly elevated at 11.4 -She does take Depakote and recent level was 16 -Checked TSH, Ammonia, RPR, and Vitamin B12 -B12 was 1266, TSH was 2.857, and Ammonia Level was 22 and RPR Negative  -Checked MRI w/o Contrast and showed Moderate to Advance Motion degradation without acute finding and a temporal predominant atrophy and moderate chronic small vessel ischemia  -EEG done and showed occasional triphasic waves, generalized irregular slow activity, and slow posterior dominant rhythm.  The EEG was consistent with a generalized nonspecific cerebral dysfunction/encephalopathy though is nonspecific to try phasic waves could be associated with toxic/metabolic encephalopathy -Hold all sedating medications but was given 1 mg of IV Lorazepam for MRI; Was getting Benadyl and Fentanyl in the ED which have been stopped and Also stopped Zoloft, Depacon, and Loratidine  -Patient had acute urinary retention so will check UA/UCx and place Foley and obtain Renal U/S -Will observe for now on telemetry with IVF hydration -Discussed the case with neurology Dr. Wilford Corner and he recommends correcting renal function.  If renal function corrects to close to baseline she is still encephalopathic neurology will formally consult -Nephrology consulted and discussed with family if no other etiologies are  identified for her AMS they could consider a trial of HD  -Nephrology checking VBG now -Palliative Consulted for GOC  Acute renal failure on stage 3 CKD -As noted above, likely associated with decreased PO intake and possibly with recent use of Septra -Recent  Baseline has been around 1.0-1.1 Cr -Gently hydrate and c/w IVF with LR ar 100 mLhr but this has stopped and changed to D5W at 75 mL/hr given her Hypernatermia and Nephrology added NS at 75 mL/hr and recommeded stopping D5W at 75 mL/hr -Change Enoxaparin to Heparin 5,000 units sq q8h -AMS may be associated with her dehydration -Patient's BUN/Cr went from 62/2.47 -> 63/2.15 -> 68/1.98 -> 85/2.09 -> 80/2.45 -Check SLP when able -Hold po Medications  -Check Urine Lytes, UAm Urine Cx, and Place Foley -Urinalysis unremarkable -Urine Osm was 410, Urine K+ was 39, Urine Sodium was <10, Urine Cr was 164.99 -Check Renal U/S -Avoid Nephrotoxic Medications if Possible -Nephrology following and appreciate further evaluation and recommendations  -Per Nephrology recommending Maintenance IVF and PRN bolus of Isotonic Fluid for Hypotension and they have resumed NS at 75 mL/hr  Acute Urinary Retention -Place Foley Catheter -Check UA and Urine Cx -Check Urine Electrolytes -Check Renal U/S  Hyperkalemia -Improved. K+ went from 5.3 -> 5.0 -> 5.6 -> 5.4 -Gave Temporazing measures and given 1 AMP of Sodium Bicarbonate, 5 units of insulin and an amp of D50 given yuesterday -C/w IVF Hydration as above per Nephrplogy -Repeat CMP in AM   Hypernatremia -We will start the patient on D5 W at 75 mL's per hour and this has been discontinued and Nephrology recommending just NS  Dementia -Patient's daughter reports mild dementia -C/w MRI as above  -Divalproex 250 mg po qHS has now been discontinued -Delirium Precautions  -Nephrology feels that AMS is out of proportion to Renal Fxn alone and Neurology feels that AMS is the because of Abnormal Renal Fxn -Have consulted Palliative for GOC  HTN -Discontinued Amlodipine due to inability to take in po -She was hypotensive upon arrival in the ER  -Likely hypovolemic so will continue IVF as above -Avoid Hypotension and PRN Isotonic Fluid Boluses for Hypotension     COPD -Patient does not appear able to participate in Advair inhalation at this time -DuoNeb stopped and will continue prn Albuterol  Hyperglycemia -May be stress response but likley to go up since starting D5W and an AMP of D50 -Glucose has been ranging from 105-141 -It is unlikely that she will need acute or chronic treatment for this issue -Will continue to Monitor Closely; Will not check HbA1c at this time  Stage 1 Sacral Ulcer -poA -C/w Frequent turning and Mepilex  Leukocytosis -? Reactive -Continue to Monitor for S/Sx of Infection -Give IVF per Nephro Recc's -If worsens will Pan-Cx -Repeat CBC in AM   Hyperphosphatemia -In the setting of worsening kidney function -Potassium was 6.3 -Continue to monitor and repeat phosphorus in a.m.  Hypermagnesemia -Patient's Mag Level this AM was 2.9 -Continue to Monitor and Repeat Mag in AM  DVT prophylaxis: Heparin 5,000 units sq q8h Code Status: DO NOT RESUSCITATE Family Communication: Discussed with Daughter Disposition Plan: Pending Improvement back to baseline and PT/OT to evaluate and treat  Consultants: None  Procedures:   None   Antimicrobials:  Anti-infectives (From admission, onward)   None     Subjective: Patient continues to remain obtunded.  Does not really respond to physical stimuli and verbal stimuli today.  Appears a little dehydrated today.  Her at  bedside states that she woke up and stated her name yesterday but has not really been responsive.  No other concerns or complaints at this time.  Objective: Vitals:   11/19/18 2306 11/20/18 0359 11/20/18 0728 11/20/18 1144  BP: 123/65 128/61 (!) 114/58 (!) 98/57  Pulse: 60 (!) 56 96 (!) 107  Resp: (!) 21 20 20 16   Temp: 98.6 F (37 C) 98.5 F (36.9 C) 97.8 F (36.6 C) 98.1 F (36.7 C)  TempSrc: Oral Oral Oral Axillary  SpO2: 97%  98% 99%  Weight:      Height:        Intake/Output Summary (Last 24 hours) at 11/20/2018 1509 Last data filed at  11/20/2018 1419 Gross per 24 hour  Intake 800 ml  Output 300 ml  Net 500 ml   Filed Weights   11/06/2018 1921  Weight: 81.2 kg   Examination: Physical Exam:  Constitutional: Veins obtunded and unresponsive.  Somnolent and does not really withdraw to sternal rub but does withdraw to physical stimuli Eyes: Lids and conjunctive are normal.  Sclera anicteric ENMT: Ears and nose appear normal.  Mucous membranes are somewhat drier today Neck: Appears normal with no appreciable JVD Respiratory: To auscultation bilaterally with no appreciable wheezing, rales, rhonchi.  Patient not tachypneic wheezing excess muscles to breathe but was mouth breathing Cardiovascular: Mildly tachycardic but regular rate. Abdomen: Soft, nontender, nondistended.  Bowel sounds present GU: Deferred Musculoskeletal: No appreciable rashes or lesions on limited skin evaluation.  Has a stage I sacral ulcer that I did not view again as I cannot turn the patient again.  Legs are a little swollen but not as much.  No erythema noted Skin: Does not follow commands and does not really respond to physical stimuli today but does withdraw from very painful stimuli Neurologic:  Psychiatric: Fair judgment and insight.  She is not awake or alert or oriented x3  Data Reviewed: I have personally reviewed following labs and imaging studies  CBC: Recent Labs  Lab 11/18/2018 1024 11/18/18 0442 11/19/18 0438 11/20/18 0402  WBC 8.0 6.4 9.5 11.4*  NEUTROABS  --   --  8.0* 9.3*  HGB 13.7 13.4 14.5 14.4  HCT 43.8 43.3 47.5* 50.9*  MCV 91.6 94.5 96.5 98.8  PLT 252 224 222 195   Basic Metabolic Panel: Recent Labs  Lab 11/18/18 0442 11/19/18 0438 11/19/18 1244 11/19/18 1513 11/20/18 0402  NA 145 145 146* 146* 144  K 5.0 5.4* 5.6* 5.7* 5.4*  CL 100 101 103 103 100  CO2 31 34* 33* 33* 35*  GLUCOSE 105* 132* 141* 142* 180*  BUN 63* 68* 85* 89* 80*  CREATININE 2.15* 1.98* 2.09* 2.19* 2.45*  CALCIUM 9.2 9.4 9.3 9.2 8.9  MG  --   2.8*  --   --  2.9*  PHOS  --  5.7*  --   --  6.3*   GFR: Estimated Creatinine Clearance: 15.9 mL/min (A) (by C-G formula based on SCr of 2.45 mg/dL (H)). Liver Function Tests: Recent Labs  Lab 10/31/2018 1024 11/19/18 0438 11/20/18 0402  AST 37 38 35  ALT 22 35 54*  ALKPHOS 90 92 87  BILITOT 1.1 0.4 0.4  PROT 6.9 6.6 6.7  ALBUMIN 3.5 3.2* 2.9*   No results for input(s): LIPASE, AMYLASE in the last 168 hours. Recent Labs  Lab 11/18/18 1210  AMMONIA 22   Coagulation Profile: No results for input(s): INR, PROTIME in the last 168 hours. Cardiac Enzymes: No results for input(s):  CKTOTAL, CKMB, CKMBINDEX, TROPONINI in the last 168 hours. BNP (last 3 results) No results for input(s): PROBNP in the last 8760 hours. HbA1C: No results for input(s): HGBA1C in the last 72 hours. CBG: No results for input(s): GLUCAP in the last 168 hours. Lipid Profile: No results for input(s): CHOL, HDL, LDLCALC, TRIG, CHOLHDL, LDLDIRECT in the last 72 hours. Thyroid Function Tests: Recent Labs    11/18/18 1210  TSH 2.857   Anemia Panel: Recent Labs    11/18/18 1210  VITAMINB12 1,266*   Sepsis Labs: Recent Labs  Lab November 20, 2018 1100  LATICACIDVEN 1.87    No results found for this or any previous visit (from the past 240 hour(s)).   RN Pressure Injury Documentation: Pressure Ulcer 04/23/16 Stage I -  Intact skin with non-blanchable redness of a localized area usually over a bony prominence. redness with healing scars (Active)  04/23/16 1912  Location: Buttocks  Location Orientation: Right;Left  Staging: Stage I -  Intact skin with non-blanchable redness of a localized area usually over a bony prominence.  Wound Description (Comments): redness with healing scars  Present on Admission: Yes     Radiology Studies: Mr Brain 64 Contrast  Result Date: 11/18/2018 CLINICAL DATA:  Altered level of consciousness, unexplained EXAM: MRI HEAD WITHOUT CONTRAST TECHNIQUE: Multiplanar, multiecho  pulse sequences of the brain and surrounding structures were obtained without intravenous contrast. COMPARISON:  Head CT from yesterday FINDINGS: Brain: No acute infarction, hemorrhage, hydrocephalus, extra-axial collection or mass masslike finding. Cerebral volume loss most notable in the temporal lobes. Patient has history of dementia and this may reflect Alzheimer's disease. Moderate chronic small vessel ischemia in the cerebral white matter. Vascular: Major flow voids are preserved Skull and upper cervical spine: Negative for marrow lesion Sinuses/Orbits: Bilateral cataract resection. Other: Persistent and at times significantly motion degraded exam. IMPRESSION: 1. Moderate to advanced motion degradation without acute finding. 2. Temporal predominant atrophy and moderate chronic small vessel ischemia. Electronically Signed   By: Marnee Spring M.D.   On: 11/18/2018 16:54   US Renal  Result Date: 11/20/2018 CLINICAL DATA:  Acute kidney injury.  Hypertension. EXAM: RENAL / URINARY TRACT ULTRASOUND COMPLETE COMPARISON:  None. FINDINGS: Right Kidney: Renal measurements: 10 x 3.9 x 9.6 cm = volume: 93.8 mL . Echogenicity within normal limits. No mass or hydronephrosis visualized. Left Kidney: Renal measurements: 10.7 x 6.2 x 4.9 cm = volume: 170.1 mL. Echogenicity within normal limits. No mass or hydronephrosis visualized. Small anechoic cyst within the interpolar left kidney measures 1.0 x 1.3 x 0.9 cm. Bladder: Appears normal for degree of bladder distention. Other: Left pleural effusion noted. IMPRESSION: 1. No obstructive uropathy identified bilaterally. 2. Left kidney cyst. Electronically Signed   By: Signa Kell M.D.   On: 11/20/2018 09:04   Scheduled Meds: . docusate sodium  100 mg Oral BID  . fluticasone  2 spray Each Nare Daily  . heparin injection (subcutaneous)  5,000 Units Subcutaneous Q8H  . sodium chloride flush  3 mL Intravenous Q12H   Continuous Infusions: . sodium chloride    .  dextrose 75 mL/hr at 11/20/18 0456  . famotidine (PEPCID) IV 20 mg (11/20/18 1342)    LOS: 2 days   Merlene Laughter, DO [facility] PAGER is on AMION  If 7PM-7AM, please contact night-coverage [email or web address] Password [email or web address] 11/20/2018, 3:09 PM

## 2018-11-20 NOTE — Consult Note (Signed)
[facility] Renal Consultation Note  Requesting MD: Dr. Marland Mcalpine Indication for Consultation:  AKI  HPI: [name] is a 84 y.o. female with COPD, OA, GERD, dementia, anemia who is seen in consultation for AKI on CKD.   She was admitted via from [facility] via ED on 12/30 for confusion, poor po intake and noted LE edema.  She had been treated for UTI week prior which had led to AMS as well and had seemed to be improving, but then she worsened again prompting the ED visit.  Her gabapentin had been stopped about 1.5 wks prior as well.  She did receive dose of benadryl and fentanyl prior to admission.   Presenting labs showed Cr elevated at 2.47, last 08/2018 was 1.  Cr initially improved to 2 yesterday am but this AM was 2.45 again.  She has been incontinent of urine and an attempt to catheterize for specimen was unsuccessful.  TSH, ammonia, B12, RPR normal.  Serum VPA level was 16 (ref 50-100) and this has been d/c'd.  Also held zoloft and loratidine. Rec'd ativan 1mg  IV yesterday for MRI.  CT and MRI brain unrevealing except atrophy and chronic small vessel disease. EEG consistent with nonspecific encephalopathy.  Neurology recommended to correct renal function and if still obtunded will see her.  She's been hydrated with isotonic fluids initially then hypotonic fluids due to serum Na 146.   She is currently obtunded and cannot participate in an interview.   PMHx:   Past Medical History:  Diagnosis Date  . Acute renal failure superimposed on stage 3 chronic kidney disease (HCC)   . Anemia, unspecified   . COPD (chronic obstructive pulmonary disease) (HCC)   . Dementia (HCC)   . Diverticulosis of colon (without mention of hemorrhage)   . DJD (degenerative joint disease)   . GERD (gastroesophageal reflux disease)   . Left humeral fracture 08/28/2017  . Lumbago   . Osteoarthrosis, unspecified whether generalized or localized, unspecified site   . Other abnormal  glucose   . Other and unspecified hyperlipidemia   . Right bundle branch block   . Shortness of breath   . Solitary cyst of breast   . Spinal stenosis, unspecified region other than cervical   . Unspecified essential hypertension     Past Surgical History:  Procedure Laterality Date  . CATARACT EXTRACTION    . conversion to right THR    . right hip hemiarthroplasty     total    Family Hx:  Family History  Problem Relation Age of Onset  . Heart failure Father   . Cancer Brother   . Cancer Brother   . Diabetes Daughter   . Cancer Brother     Social History:  reports that she has never smoked. She has never used smokeless tobacco. She reports that she does not drink alcohol or use drugs.  Allergies:  Allergies  Allergen Reactions  . Azithromycin Shortness Of Breath  . Tape Other (See Comments)    PATIENT'S SKIN IS VERY THIN- TEARS AND BRUISES VERY EASILY!!!!  . Ciprofloxacin Other (See Comments)    Hallucinations   . Levofloxacin Other (See Comments)    Insomnia, indigestion, tingling sensation in legs  . Furosemide Rash and Other (See Comments)    Bullous pemphigoid = "a rare autoimmune skin condition causing large, fluid-filled blisters"  . Latex Rash  . Other Rash    EKG leads caused a rash that required steroids to clear  Medications: Prior to Admission medications   Medication Sig Start Date End Date Taking? Authorizing Provider  ADVAIR DISKUS 100-50 MCG/DOSE AEPB INHALE 1 PUFF BY MOUTH TWICE DAILY. RINSE MOUTH AFTER USE TO PREVENT THRUSH. Patient taking differently: Inhale 1 puff into the lungs 2 (two) times daily. RINSE BY MOUTH AFTERWARDS TO PREVENT THRUSH 01/23/18  Yes Michele Mcalpine, MD  albuterol (PROAIR HFA) 108 (90 Base) MCG/ACT inhaler USE 1 TO 2 PUFFS EVERY SIX HOURS AS NEEDED. Patient taking differently: Inhale 2 puffs into the lungs every 6 (six) hours as needed for wheezing or shortness of breath.  08/19/17  Yes Michele Mcalpine, MD  amLODipine  (NORVASC) 5 MG tablet TAKE ONE TABLET BY MOUTH ONCE DAILY. Patient taking differently: Take 5 mg by mouth daily.  01/22/17  Yes Michele Mcalpine, MD  ASPIRIN LOW DOSE 81 MG EC tablet TAKE ONE TABLET BY MOUTH ONCE DAILY. Patient taking differently: Take 81 mg by mouth daily.  06/12/16  Yes Michele Mcalpine, MD  cetirizine (ZYRTEC) 10 MG tablet TAKE 1 TABLET BY MOUTH ONCE DAILY FOR ALLERGIES. Patient taking differently: Take 10 mg by mouth daily.  08/01/17  Yes Michele Mcalpine, MD  Cholecalciferol (VITAMIN D3) 2000 units capsule TAKE (1) CAPSULE BY MOUTH ONCE DAILY. Patient taking differently: Take 2,000 Units by mouth daily.  09/19/17  Yes Michele Mcalpine, MD  divalproex (DEPAKOTE SPRINKLE) 125 MG capsule OPEN (2) CAPSULES (250MG ) AND SPRINKLE ON APPLESAUCE AT BEDTIME. Patient taking differently: Take 250 mg by mouth See admin instructions. "Open 2 capsules (250 mg) and sprinkle onto applesauce at bedtime" for patient to take by mouth 10/29/18  Yes Michele Mcalpine, MD  fluticasone (FLONASE) 50 MCG/ACT nasal spray SPRAY 2 SPRAYS INTO EACH NOSTRIL ONCE DAILY. Patient taking differently: Place 2 sprays into both nostrils daily.  03/16/16  Yes Bevelyn Ngo, NP  gabapentin (NEURONTIN) 100 MG capsule TAKE (2) CAPSULES BY MOUTH EACH MORNING. TAKE (6) CAPSULES BY MOUTH AT BEDTIME. Patient taking differently: Take 200-600 mg by mouth See admin instructions. Take 200 mg by mouth in the morning and 600 mg at bedtime 09/29/18  Yes Jaffe, Adam R, DO  ipratropium-albuterol (DUONEB) 0.5-2.5 (3) MG/3ML SOLN Take 3 mLs by nebulization every 4 (four) hours as needed (FOR SHORTNESS OF BREATH).   Yes [provider]  LORazepam (ATIVAN) 0.5 MG tablet TAKE (1/2) TABLET BY MOUTH TWICE DAILY.(11:30AM & 4:30PM) Patient taking differently: Take 0.25 mg by mouth 2 (two) times daily.  09/04/18  Yes Michele Mcalpine, MD  MAPAP 500 MG tablet TAKE 2 TABLETS (650MG ) BY MOUTH EVERY 4 HOURS AS NEEDED FOR MILD PAIN. Patient taking  differently: Take 1,000 mg by mouth every 4 (four) hours as needed for mild pain.  01/17/18  Yes Michele Mcalpine, MD  Melatonin 3 MG SUBL Place 6 mg under the tongue daily. Patient taking differently: Place 6 mg under the tongue daily at 8 pm.  10/16/17  Yes Michele Mcalpine, MD  Multiple Vitamin (DAILY-VITE) TABS TAKE ONE TABLET BY MOUTH ONCE DAILY. Patient taking differently: Take 1 tablet by mouth daily.  04/09/16  Yes Michele Mcalpine, MD  polyethylene glycol powder (GLYCOLAX/MIRALAX) powder Take 17 g by mouth daily as needed for mild constipation. To be mixed in 8 ounces of juice or water   Yes [provider]  ranitidine (ZANTAC) 150 MG tablet TAKE (1) TABLET BY MOUTH ONCE DAILY. Patient taking differently: Take 150 mg by mouth daily.  08/12/17  Yes Michele Mcalpine, MD  sertraline (ZOLOFT) 50 MG tablet TAKE (1) TABLET BY MOUTH ONCE DAILY. Patient taking differently: Take 50 mg by mouth daily.  10/29/18  Yes Michele Mcalpine, MD  torsemide (DEMADEX) 20 MG tablet TAKE 20 MG BY MOUTH EVERY MORNING. Patient taking differently: Take 20 mg by mouth daily.  08/23/17  Yes Parrett, Virgel Bouquet, NP  Spacer/Aero-Holding Chambers (AEROCHAMBER PLUS WITH MASK) inhaler Use as instructed 06/12/15   Arby Barrette, MD    I have reviewed the patient's current medications.  Labs:  Results for orders placed or performed during the hospital encounter of 2018/12/12 (from the past 48 hour(s))  CBC with Differential/Platelet     Status: Abnormal   Collection Time: 11/19/18  4:38 AM  Result Value Ref Range   WBC 9.5 4.0 - 10.5 K/uL   RBC 4.92 3.87 - 5.11 MIL/uL   Hemoglobin 14.5 12.0 - 15.0 g/dL   HCT 60.4 (H) 54.0 - 98.1 %   MCV 96.5 80.0 - 100.0 fL   MCH 29.5 26.0 - 34.0 pg   MCHC 30.5 30.0 - 36.0 g/dL   RDW 19.1 (H) 47.8 - 29.5 %   Platelets 222 150 - 400 K/uL   nRBC 0.0 0.0 - 0.2 %   Neutrophils Relative % 84 %   Neutro Abs 8.0 (H) 1.7 - 7.7 K/uL   Lymphocytes Relative 7 %   Lymphs Abs 0.6 (L) 0.7 - 4.0  K/uL   Monocytes Relative 8 %   Monocytes Absolute 0.8 0.1 - 1.0 K/uL   Eosinophils Relative 0 %   Eosinophils Absolute 0.0 0.0 - 0.5 K/uL   Basophils Relative 0 %   Basophils Absolute 0.0 0.0 - 0.1 K/uL   Immature Granulocytes 1 %   Abs Immature Granulocytes 0.07 0.00 - 0.07 K/uL    Comment: Performed at [facility], [address] [place], [place] [address]  Comprehensive metabolic panel     Status: Abnormal   Collection Time: 11/19/18  4:38 AM  Result Value Ref Range   Sodium 145 135 - 145 mmol/L   Potassium 5.4 (H) 3.5 - 5.1 mmol/L   Chloride 101 98 - 111 mmol/L   CO2 34 (H) 22 - 32 mmol/L   Glucose, Bld 132 (H) 70 - 99 mg/dL   BUN 68 (H) 8 - 23 mg/dL   Creatinine, Ser 8.65 (H) 0.44 - 1.00 mg/dL   Calcium 9.4 8.9 - 78.4 mg/dL   Total Protein 6.6 6.5 - 8.1 g/dL   Albumin 3.2 (L) 3.5 - 5.0 g/dL   AST 38 15 - 41 U/L   ALT 35 0 - 44 U/L   Alkaline Phosphatase 92 38 - 126 U/L   Total Bilirubin 0.4 0.3 - 1.2 mg/dL   GFR calc non Af Amer 21 (L) >60 mL/min   GFR calc Af Amer 25 (L) >60 mL/min   Anion gap 10 5 - 15    Comment: Performed at [facility], [address] [place], [place] [address]  Magnesium     Status: Abnormal   Collection Time: 11/19/18  4:38 AM  Result Value Ref Range   Magnesium 2.8 (H) 1.7 - 2.4 mg/dL    Comment: Performed at [facility], [address], [place], [place] [address]  Phosphorus     Status: Abnormal   Collection Time: 11/19/18  4:38 AM  Result Value Ref Range   Phosphorus 5.7 (H) 2.5 - 4.6 mg/dL  Comment: Performed at [facility], [address] [place], [place] [address]  Basic metabolic panel     Status: Abnormal   Collection Time: 11/19/18 12:44 PM  Result Value Ref Range   Sodium 146 (H) 135 - 145 mmol/L   Potassium 5.6 (H) 3.5 - 5.1 mmol/L   Chloride 103 98 - 111 mmol/L   CO2 33 (H) 22 - 32 mmol/L   Glucose, Bld 141 (H) 70 - 99 mg/dL   BUN 85 (H) 8 - 23 mg/dL   Creatinine, Ser 9.562.09 (H) 0.44 - 1.00  mg/dL   Calcium 9.3 8.9 - 21.310.3 mg/dL   GFR calc non Af Amer 20 (L) >60 mL/min   GFR calc Af Amer 23 (L) >60 mL/min   Anion gap 10 5 - 15    Comment: Performed at [facility], [address] [place], [place] [address]  Basic metabolic panel     Status: Abnormal   Collection Time: 11/19/18  3:13 PM  Result Value Ref Range   Sodium 146 (H) 135 - 145 mmol/L   Potassium 5.7 (H) 3.5 - 5.1 mmol/L   Chloride 103 98 - 111 mmol/L   CO2 33 (H) 22 - 32 mmol/L   Glucose, Bld 142 (H) 70 - 99 mg/dL   BUN 89 (H) 8 - 23 mg/dL   Creatinine, Ser 8.462.19 (H) 0.44 - 1.00 mg/dL   Calcium 9.2 8.9 - 96.210.3 mg/dL   GFR calc non Af Amer 19 (L) >60 mL/min   GFR calc Af Amer 22 (L) >60 mL/min   Anion gap 10 5 - 15    Comment: Performed at [facility], [address] [place], [place] [address]  CBC with Differential/Platelet     Status: Abnormal   Collection Time: 11/20/18  4:02 AM  Result Value Ref Range   WBC 11.4 (H) 4.0 - 10.5 K/uL   RBC 5.15 (H) 3.87 - 5.11 MIL/uL   Hemoglobin 14.4 12.0 - 15.0 g/dL   HCT 13.250.9 (H) 44.036.0 - 10.246.0 %   MCV 98.8 80.0 - 100.0 fL   MCH 28.0 26.0 - 34.0 pg   MCHC 28.3 (L) 30.0 - 36.0 g/dL   RDW 72.515.4 36.611.5 - 44.015.5 %   Platelets 195 150 - 400 K/uL   nRBC 0.5 (H) 0.0 - 0.2 %   Neutrophils Relative % 82 %   Neutro Abs 9.3 (H) 1.7 - 7.7 K/uL   Lymphocytes Relative 5 %   Lymphs Abs 0.6 (L) 0.7 - 4.0 K/uL   Monocytes Relative 11 %   Monocytes Absolute 1.3 (H) 0.1 - 1.0 K/uL   Eosinophils Relative 0 %   Eosinophils Absolute 0.0 0.0 - 0.5 K/uL   Basophils Relative 0 %   Basophils Absolute 0.0 0.0 - 0.1 K/uL   Immature Granulocytes 2 %   Abs Immature Granulocytes 0.20 (H) 0.00 - 0.07 K/uL    Comment: Performed at [facility], [address] [place], [place] [address]  Comprehensive metabolic panel     Status: Abnormal   Collection Time: 11/20/18  4:02 AM  Result Value Ref Range   Sodium 144 135 - 145 mmol/L   Potassium 5.4 (H) 3.5 - 5.1 mmol/L   Chloride 100  98 - 111 mmol/L   CO2 35 (H) 22 - 32 mmol/L   Glucose, Bld 180 (H) 70 - 99 mg/dL   BUN 80 (H) 8 - 23 mg/dL   Creatinine, Ser 5.952.45 (H)  0.44 - 1.00 mg/dL   Calcium 8.9 8.9 - 40.9 mg/dL   Total Protein 6.7 6.5 - 8.1 g/dL   Albumin 2.9 (L) 3.5 - 5.0 g/dL   AST 35 15 - 41 U/L   ALT 54 (H) 0 - 44 U/L   Alkaline Phosphatase 87 38 - 126 U/L   Total Bilirubin 0.4 0.3 - 1.2 mg/dL   GFR calc non Af Amer 16 (L) >60 mL/min   GFR calc Af Amer 19 (L) >60 mL/min   Anion gap 9 5 - 15    Comment: Performed at [facility], [address], [place], [place] [address]  Magnesium     Status: Abnormal   Collection Time: 11/20/18  4:02 AM  Result Value Ref Range   Magnesium 2.9 (H) 1.7 - 2.4 mg/dL    Comment: Performed at [facility], [address] [place], [place] [address]  Phosphorus     Status: Abnormal   Collection Time: 11/20/18  4:02 AM  Result Value Ref Range   Phosphorus 6.3 (H) 2.5 - 4.6 mg/dL    Comment: Performed at [facility], [address] [place], [place] [address]     ROS:  Review of systems not obtained due to patient factors.  Physical Exam: Vitals:   11/20/18 0728 11/20/18 1144  BP: (!) 114/58 (!) 98/57  Pulse: 96 (!) 107  Resp: 20 16  Temp: 97.8 F (36.6 C) 98.1 F (36.7 C)  SpO2: 98% 99%     General: lying flat in bed, unresponsive HEENT: MM dry with her mouth breathing Eyes: holds them shut, does not localize Neck: no JVD Heart: RRR, no rub Lungs: clear anteriorly, inc WOB, normal RR Abdomen: soft, nontender, nondistended Extremities: 1+ pitting dependent edema in the thighs, trace in the ankles Skin: no rashes or lesions Neuro: responds by turning away from noxious stimuli, no clonus  Assessment/Plan:  **AMS: seems out of proportion to renal function alone though that could be contributing.   W/u has to date been unrevealing.  --F/u UA and urine culture --Check VBG now --Discussed if no other etiologies are identified we could  consider a trial of hemodialysis to see if that clears mental status.  I discussed this with her daughter, explaining this is not a good long term option for her given her limited functional status but I would consider a trial if she remains obtunded with no other etiology identified.  **AKI:  Creatinine normal at BL and now elevated in the mid 2s in the setting of recent UTI, AMS and poor po intake --Renal US just completed -f/u results --UA, urine sodium and creatinine are being sent now-- a foley was just placed --I/Os --Agree with MIVF and give PRN bolus of isotonic fluid for hypotension.  I have resumed NS @75hr , will follow serum sodium.  Her last BP was a bit low and I'd like to volume expand her a bit more.   **HTN:  Normotensive off meds  Will follow.  Call with questions or concerns.  Tyler Pita 11/20/2018, 1:50 PM

## 2018-11-21 ENCOUNTER — Inpatient Hospital Stay (HOSPITAL_COMMUNITY): Payer: Medicare Other

## 2018-11-21 DIAGNOSIS — G9341 Metabolic encephalopathy: Secondary | ICD-10-CM

## 2018-11-21 DIAGNOSIS — Z7189 Other specified counseling: Secondary | ICD-10-CM

## 2018-11-21 DIAGNOSIS — N179 Acute kidney failure, unspecified: Secondary | ICD-10-CM

## 2018-11-21 DIAGNOSIS — Z515 Encounter for palliative care: Secondary | ICD-10-CM

## 2018-11-21 LAB — BASIC METABOLIC PANEL
Anion gap: 8 (ref 5–15)
BUN: 100 mg/dL — ABNORMAL HIGH (ref 8–23)
CALCIUM: 8.5 mg/dL — AB (ref 8.9–10.3)
CO2: 30 mmol/L (ref 22–32)
CREATININE: 3.77 mg/dL — AB (ref 0.44–1.00)
Chloride: 105 mmol/L (ref 98–111)
GFR calc Af Amer: 11 mL/min — ABNORMAL LOW (ref >60)
GFR, EST NON AFRICAN AMERICAN: 10 mL/min — AB (ref >60)
Glucose, Bld: 142 mg/dL — ABNORMAL HIGH (ref 70–99)
Potassium: 5.4 mmol/L — ABNORMAL HIGH (ref 3.5–5.1)
Sodium: 143 mmol/L (ref 135–145)

## 2018-11-21 LAB — CBC WITH DIFFERENTIAL/PLATELET
Abs Immature Granulocytes: 0.23 10*3/uL — ABNORMAL HIGH (ref 0.00–0.07)
Basophils Absolute: 0 10*3/uL (ref 0.0–0.1)
Basophils Relative: 0 %
Eosinophils Absolute: 0 10*3/uL (ref 0.0–0.5)
Eosinophils Relative: 0 %
HEMATOCRIT: 54 % — AB (ref 36.0–46.0)
Hemoglobin: 15.1 g/dL — ABNORMAL HIGH (ref 12.0–15.0)
Immature Granulocytes: 2 %
Lymphocytes Relative: 4 %
Lymphs Abs: 0.5 10*3/uL — ABNORMAL LOW (ref 0.7–4.0)
MCH: 28.4 pg (ref 26.0–34.0)
MCHC: 28 g/dL — ABNORMAL LOW (ref 30.0–36.0)
MCV: 101.5 fL — ABNORMAL HIGH (ref 80.0–100.0)
MONO ABS: 1.1 10*3/uL — AB (ref 0.1–1.0)
Monocytes Relative: 10 %
Neutro Abs: 9.9 10*3/uL — ABNORMAL HIGH (ref 1.7–7.7)
Neutrophils Relative %: 84 %
Platelets: 141 10*3/uL — ABNORMAL LOW (ref 150–400)
RBC: 5.32 MIL/uL — ABNORMAL HIGH (ref 3.87–5.11)
RDW: 15.2 % (ref 11.5–15.5)
WBC: 11.8 10*3/uL — ABNORMAL HIGH (ref 4.0–10.5)
nRBC: 1.2 % — ABNORMAL HIGH (ref 0.0–0.2)

## 2018-11-21 LAB — COMPREHENSIVE METABOLIC PANEL
ALT: 46 U/L — ABNORMAL HIGH (ref 0–44)
AST: 25 U/L (ref 15–41)
Albumin: 2.8 g/dL — ABNORMAL LOW (ref 3.5–5.0)
Alkaline Phosphatase: 84 U/L (ref 38–126)
Anion gap: 8 (ref 5–15)
BILIRUBIN TOTAL: 0.4 mg/dL (ref 0.3–1.2)
BUN: 92 mg/dL — ABNORMAL HIGH (ref 8–23)
CO2: 28 mmol/L (ref 22–32)
Calcium: 8.8 mg/dL — ABNORMAL LOW (ref 8.9–10.3)
Chloride: 106 mmol/L (ref 98–111)
Creatinine, Ser: 3.36 mg/dL — ABNORMAL HIGH (ref 0.44–1.00)
GFR calc non Af Amer: 11 mL/min — ABNORMAL LOW (ref >60)
GFR, EST AFRICAN AMERICAN: 13 mL/min — AB (ref >60)
Glucose, Bld: 118 mg/dL — ABNORMAL HIGH (ref 70–99)
Potassium: 6.1 mmol/L — ABNORMAL HIGH (ref 3.5–5.1)
Sodium: 142 mmol/L (ref 135–145)
Total Protein: 6.1 g/dL — ABNORMAL LOW (ref 6.5–8.1)

## 2018-11-21 LAB — NA AND K (SODIUM & POTASSIUM), RAND UR: Potassium Urine: 39 mmol/L

## 2018-11-21 LAB — MAGNESIUM: Magnesium: 2.8 mg/dL — ABNORMAL HIGH (ref 1.7–2.4)

## 2018-11-21 LAB — URINE CULTURE: Culture: NO GROWTH

## 2018-11-21 LAB — VITAMIN B1: Vitamin B1 (Thiamine): 156.3 nmol/L (ref 66.5–200.0)

## 2018-11-21 LAB — GLUCOSE, CAPILLARY
Glucose-Capillary: 198 mg/dL — ABNORMAL HIGH (ref 70–99)
Glucose-Capillary: 144 mg/dL — ABNORMAL HIGH (ref 70–99)

## 2018-11-21 LAB — PHOSPHORUS: PHOSPHORUS: 7.9 mg/dL — AB (ref 2.5–4.6)

## 2018-11-21 MED ORDER — GLYCOPYRROLATE 0.2 MG/ML IJ SOLN: 0.2000 mg | INTRAMUSCULAR | Status: DC | PRN

## 2018-11-21 MED ORDER — DEXTROSE 50 % IV SOLN
1.0000 | Freq: Once | INTRAVENOUS | Status: AC
  Administered 2018-11-21: 50 mL via INTRAVENOUS

## 2018-11-21 MED ORDER — HYDROMORPHONE HCL 1 MG/ML IJ SOLN: 0.5000 mg | INTRAMUSCULAR | Status: DC | PRN

## 2018-11-21 MED ORDER — LORAZEPAM 2 MG/ML PO CONC: 1.0000 mg | ORAL | Status: DC | PRN

## 2018-11-21 MED ORDER — HALOPERIDOL LACTATE 5 MG/ML IJ SOLN: 0.5000 mg | INTRAMUSCULAR | Status: DC | PRN

## 2018-11-21 MED ORDER — DEXTROSE 50 % IV SOLN
INTRAVENOUS | Status: AC
  Filled 2018-11-21: qty 50

## 2018-11-21 MED ORDER — BIOTENE DRY MOUTH MT LIQD: 15.0000 mL | Freq: Two times a day (BID) | OROMUCOSAL | Status: DC

## 2018-11-21 MED ORDER — SODIUM BICARBONATE 8.4 % IV SOLN
50.0000 meq | Freq: Once | INTRAVENOUS | Status: AC
  Administered 2018-11-21: 50 meq via INTRAVENOUS
  Filled 2018-11-21 (×2): qty 50

## 2018-11-21 MED ORDER — LORAZEPAM 2 MG/ML IJ SOLN: 1.0000 mg | INTRAMUSCULAR | Status: DC | PRN

## 2018-11-21 MED ORDER — OXYCODONE HCL 20 MG/ML PO CONC: 5.0000 mg | ORAL | Status: DC | PRN

## 2018-11-21 MED ORDER — HALOPERIDOL LACTATE 2 MG/ML PO CONC
0.5000 mg | ORAL | Status: DC | PRN
  Filled 2018-11-21: qty 0.3

## 2018-11-21 MED ORDER — INSULIN ASPART 100 UNIT/ML IV SOLN
5.0000 [IU] | Freq: Once | INTRAVENOUS | Status: AC
  Administered 2018-11-21: 5 [IU] via INTRAVENOUS

## 2018-11-22 NOTE — Progress Notes (Signed)
IV taken out, Foley taken out, body wash and clean. Pt placement notified. Dr. Madelyn Flavors signed death certificate.  Will transport pt to [facility].

## 2018-11-26 LAB — CULTURE, BLOOD (ROUTINE X 2)
CULTURE: NO GROWTH
Culture: NO GROWTH
Special Requests: ADEQUATE
Special Requests: ADEQUATE

## 2018-12-11 ENCOUNTER — Ambulatory Visit: Payer: Medicare Other | Admitting: Family Medicine

## 2018-12-20 NOTE — Progress Notes (Addendum)
[facility] Progress Note   Subjective:   No improvement in mental status.  Labs show hyperkalemia, worsening renal function.  VBG pending. Remains obtunded.  Worsening hypoxia.  Daughter at bedside.   Palliative care consult this AM.   Objective Vitals:   11/20/18 1536 11/20/18 1921 11/20/18 2318 2018-12-16 0312  BP: (!) 89/58 (!) 100/57 103/64 (!) 109/59  Pulse: (!) 101 (!) 53 100 99  Resp: 14 (!) 21 (!) 24 (!) 22  Temp: 97.6 F (36.4 C) 97.8 F (36.6 C) 98.4 F (36.9 C) 97.6 F (36.4 C)  TempSrc: Axillary Axillary Oral Oral  SpO2: 96% 98% (!) 86% 92%  Weight:      Height:       Physical Exam General: obtunded Heart: borderline tachycardic Lungs: inc WOB, dec BS bases Abdomen: soft nontender Extremities: no edema GU: foley draining scan amber urine.   Additional Objective Labs: Basic Metabolic Panel: Recent Labs  Lab 11/19/18 0438  11/19/18 1513 11/20/18 0402 2018-12-16 0859  NA 145   < > 146* 144 142  K 5.4*   < > 5.7* 5.4* 6.1*  CL 101   < > 103 100 106  CO2 34*   < > 33* 35* 28  GLUCOSE 132*   < > 142* 180* 118*  BUN 68*   < > 89* 80* 92*  CREATININE 1.98*   < > 2.19* 2.45* 3.36*  CALCIUM 9.4   < > 9.2 8.9 8.8*  PHOS 5.7*  --   --  6.3* 7.9*   < > = values in this interval not displayed.   Liver Function Tests: Recent Labs  Lab 11/19/18 0438 11/20/18 0402 12-16-2018 0859  AST 38 35 25  ALT 35 54* 46*  ALKPHOS 92 87 84  BILITOT 0.4 0.4 0.4  PROT 6.6 6.7 6.1*  ALBUMIN 3.2* 2.9* 2.8*   No results for input(s): LIPASE, AMYLASE in the last 168 hours. CBC: Recent Labs  Lab 10/29/2018 1024 11/18/18 0442 11/19/18 0438 11/20/18 0402 2018-12-16 0601  WBC 8.0 6.4 9.5 11.4* 11.8*  NEUTROABS  --   --  8.0* 9.3* 9.9*  HGB 13.7 13.4 14.5 14.4 15.1*  HCT 43.8 43.3 47.5* 50.9* 54.0*  MCV 91.6 94.5 96.5 98.8 101.5*  PLT 252 224 222 195 141*   Blood Culture    Component Value Date/Time   SDES URINE, CATHETERIZED 11/20/2018 1416   SPECREQUEST NONE 11/20/2018 1416   CULT  11/20/2018 1416    NO GROWTH Performed at [facility], [address] [place], [place] [address]    REPTSTATUS 12-16-2018 FINAL 11/20/2018 1416    Cardiac Enzymes: No results for input(s): CKTOTAL, CKMB, CKMBINDEX, TROPONINI in the last 168 hours. CBG: No results for input(s): GLUCAP in the last 168 hours. Iron Studies: No results for input(s): IRON, TIBC, TRANSFERRIN, FERRITIN in the last 72 hours. @lablastinr3 @ Studies/Results: US Renal  Result Date: 11/20/2018 CLINICAL DATA:  Acute kidney injury.  Hypertension. EXAM: RENAL / URINARY TRACT ULTRASOUND COMPLETE COMPARISON:  None. FINDINGS: Right Kidney: Renal measurements: 10 x 3.9 x 9.6 cm = volume: 93.8 mL . Echogenicity within normal limits. No mass or hydronephrosis visualized. Left Kidney: Renal measurements: 10.7 x 6.2 x 4.9 cm = volume: 170.1 mL. Echogenicity within normal limits. No mass or hydronephrosis visualized. Small anechoic cyst within the interpolar left kidney measures 1.0 x 1.3 x 0.9 cm. Bladder: Appears normal for degree of bladder distention. Other: Left pleural effusion noted. IMPRESSION: 1. No obstructive uropathy identified  bilaterally. 2. Left kidney cyst. Electronically Signed   By: Signa Kellaylor  Stroud M.D.   On: 11/20/2018 09:04   Dg Chest Port 1 View  Result Date: 11/24/2018 CLINICAL DATA:  Shortness of breath, hypoxia, history acute kidney injury, COPD, dementia, hypertension EXAM: PORTABLE CHEST 1 VIEW COMPARISON:  Portable exam 0653 hours compared to 03-20-18 FINDINGS: Rotated to the LEFT. Normal heart size, mediastinal contours, and pulmonary vascularity. Atherosclerotic calcification aorta. Bibasilar effusions and atelectasis. No infiltrate or pneumothorax. Bones demineralized. IMPRESSION: Bibasilar effusions and atelectasis. Electronically Signed   By: Ulyses SouthwardMark  Boles M.D.   On: 11/20/2018 08:17   Medications: . sodium chloride 75 mL/hr at 12/14/2018 0636  . famotidine  (PEPCID) IV 20 mg (11/20/18 1342)   . insulin aspart  5 Units Intravenous Once   And  . dextrose  1 ampule Intravenous Once  . docusate sodium  100 mg Oral BID  . fluticasone  2 spray Each Nare Daily  . heparin injection (subcutaneous)  5,000 Units Subcutaneous Q8H  . sodium bicarbonate  50 mEq Intravenous Once  . sodium chloride flush  3 mL Intravenous Q12H    Assessment/Plan: **AMS: seems out of proportion to renal function alone though that could be contributing.   W/u has to date been unrevealing.   She remains obtunded.  --Check VBG now --Discussed with daughter that she clinically appears to be worsening and no reversible etiologies have been identified.  Given her advanced age, frail status, limited functional status prior to recent events I've recommended against pursing dialysis treatments.  She is understanding and supportive of this decision.  I think palliative measures would be appropriate and I let her know that.  --Appreciate palliative care involvement.   **AKI:  Creatinine normal at BL and elevated in the mid 2s in the setting of recent UTI, AMS and poor po intake > worsening today into the 3s now with hyperkalemia despite IVF. Renal US ok.  --Hypoxia worsening with BL pleural effusions and atelectasis. I'm hesitant to give additional IV as this may acutely worsen.   --Per above pursue palliative measures.   I will sign off as I don't think I will be adding anything to her care, but should nephrology be able to assist please let us know.   Estill BakesLindsay Fabyan Loughmiller MD 11/25/2018, 11:26 AM  [facility] Pager: [phone]

## 2018-12-20 NOTE — Consult Note (Signed)
Consultation Note Date: December 17, 2018   Patient Name: [name]  DOB: Jul 10, 1923  MRN: [id number]  Age / Sex: 85 y.o., female  PCP: Leamon Arnt, MD Referring Physician: Kerney Elbe, DO  Reason for Consultation: Establishing goals of care  HPI/Patient Profile: 85 y.o. female  with past medical history of dementia, htn, hld, and COPD admitted on 10/28/2018 with confusion. She recently had UTI that was treated with septra and she appeared to be returning to baseline. However, she soon began to decline again - increasing weakness and confusion.  Also with significant lower extremity edema and poor PO intake. Since admission, patient has remained obtunded, unable to follow commands, and no PO intake. Work-up revealed acute renal failure - likely r/t poor PO intake and possible antibiotic use. MRI with no acute finding. Since admission, renal function has continued to worsen despite IV fluids. Now with pleural effusions and hypoxia. PMT consulted for [place].  Clinical Assessment and Goals of Care: I have reviewed medical records including EPIC notes, labs and imaging, received report from Dr. Alfredia Ferguson, assessed the patient and then met with patient's daughter, [name], to discuss diagnosis prognosis, [place], EOL wishes, disposition and options.  I met with [name] early this AM initially.   I introduced Palliative Medicine as specialized medical care for people living with serious illness. It focuses on providing relief from the symptoms and stress of a serious illness. The goal is to improve quality of life for both the patient and the family.  We discussed a brief life review of the patient. 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 She tells me the patient has 3 children - one has passed away and she is estranged from another.   As far as functional and nutritional status, [name] tells me the  patient has been wheelchair bound for the past four months. She also tells me of cognitive changes - increased agitation at night and sleeping more than usual. She tells me patient had good PO intake until she was diagnosed with UTI a few weeks ago.    We discussed her current illness and what it means in the larger context of her on-going co-morbidities. We discussed kidney failure and AMS. We discussed option of dialysis and [name] agrees she would not want to pursue this for her mother. We discussed code status and [name] asks for clarification on DNR - she asks about changing code status to full code. I recommended against this given patient's poor functional status, frailty, and advanced age. We discussed that DNR does not mean "do not treat" and this only limits how the medical team responds if patient dies. After discussion, [name] agrees that DNR is appropriate. [name] tells me she remains hopeful for improvement.   Throughout conversation, [name] expresses frustration that patient was given fentanyl and benadryl in ED. I have reviewed MAR and do not see either of these medications documented, nor do I see them referenced in ED notes. [name] is unsure who told her the patient received these medications or why - potentially by EMS?  Later in the afternoon, I returned to check on patient. Patient has been switched to face mask d/t hypoxia. Labs revealed worsening kidney function and patient is not responsive to sternal rub. Nephrology has spoken with [name] and recommending palliative measures.  [name] was not in the room so we spoke via phone about patient's status. She reports understanding of poor prognosis after  conversation with nephrology. I shared patient is nearing end of life and she expresses understanding. We discussed her current care with IV fluids, medications, frequent labs, and increasing oxygen requirements. I expressed my concerns that continuing with aggressive care may no  longer be appropriate or change the outcome. We discussed transitioning the patient's care to one that focuses on her comfort only. [name] agrees with this and tells me "do not poke her anymore" "I want her to be comfortable". I explained comfort measures: discontinuing medications/procedures not aimed at comfort, using PRN medications to ensure comfort, and not escalating care. We discussed oxygen requirements - will leave on non-rebreather until [name] arrives but then may transition to nasal cannula if [place] desires.  [name] was quite tearful during conversation - we did not discuss disposition plan. Will reevaluate in AM - if patient appears stable to transfer to residential hospice I will discuss this with [name] then, but I would not be surprised if patient experiences hospital death.   Questions and concerns were addressed. The family was encouraged to call with questions or concerns.   Primary Decision Maker NEXT OF KIN - daughter, [name]    SUMMARY OF RECOMMENDATIONS   - comfort measures only - transition back to nasal cannula once daughter arrives - utilize medications for dyspnea - d/c IV fluids and labs, unnecessary medications -no hemodialysis - will discuss residential hospice with daughter in AM if patient is stable for transport - possible hospital death  Code Status/Advance Care Planning:  DNR   Symptom Management:   Robinul 0.2 mg IV PRN q4hr excessive secretions  Haldol 0.5 mg IV or SL q4hr agitation  Dilaudid 0.5 mg IV q2hr PRN pain or dyspnea  Ativan 1 mg IV or SL q4hr PRN anxiety  Roxicodone SL q2hr PRN moderate pain or dyspnea  Palliative Prophylaxis:   Aspiration, Delirium Protocol, Frequent Pain Assessment, Oral Care and Turn Reposition  Additional Recommendations (Limitations, Scope, Preferences):  Full Comfort Care  Psycho-social/Spiritual:   Desire for further Chaplaincy support:yes  Additional Recommendations: Education on  Hospice  Prognosis:   Hours - Days  Discharge Planning: To Be Determined  - hospital death vs residential hospice     Primary Diagnoses: Present on Admission: . Acute metabolic encephalopathy . Dementia ([place]) . COPD (chronic obstructive pulmonary disease) ([place]) . Essential hypertension . Other abnormal glucose . Acute renal failure superimposed on stage 3 chronic kidney disease ([place])   I have reviewed the medical record, interviewed the patient and family, and examined the patient. The following aspects are pertinent.  Past Medical History:  Diagnosis Date  . Acute renal failure superimposed on stage 3 chronic kidney disease ([place])   . Anemia, unspecified   . COPD (chronic obstructive pulmonary disease) ([place])   . Dementia ([place])   . Diverticulosis of colon (without mention of hemorrhage)   . DJD (degenerative joint disease)   . GERD (gastroesophageal reflux disease)   . Left humeral fracture 08/28/2017  . Lumbago   . Osteoarthrosis, unspecified whether generalized or localized, unspecified site   . Other abnormal glucose   . Other and unspecified hyperlipidemia   . Right bundle branch block   . Shortness of breath   . Solitary cyst of breast   . Spinal stenosis, unspecified region other than cervical   . Unspecified essential hypertension    Social History   Socioeconomic History  . Marital status: Divorced    Spouse name: Not on file  . Number of children: Not on file  .  Years of education: Not on file  . Highest education level: Not on file  Occupational History  . Occupation: retired  Scientific laboratory technician  . Financial resource strain: Not on file  . Food insecurity:    Worry: Not on file    Inability: Not on file  . Transportation needs:    Medical: Not on file    Non-medical: Not on file  Tobacco Use  . Smoking status: Never Smoker  . Smokeless tobacco: Never Used  Substance and Sexual Activity  . Alcohol use: No    Alcohol/week: 0.0 standard drinks  . Drug  use: No  . Sexual activity: Never  Lifestyle  . Physical activity:    Days per week: Not on file    Minutes per session: Not on file  . Stress: Not on file  Relationships  . Social connections:    Talks on phone: Not on file    Gets together: Not on file    Attends religious service: Not on file    Active member of club or organization: Not on file    Attends meetings of clubs or organizations: Not on file    Relationship status: Not on file  Other Topics Concern  . Not on file  Social History Narrative  . Not on file   Family History  Problem Relation Age of Onset  . Heart failure Father   . Cancer Brother   . Cancer Brother   . Diabetes Daughter   . Cancer Brother    Scheduled Meds: . docusate sodium  100 mg Oral BID  . fluticasone  2 spray Each Nare Daily  . heparin injection (subcutaneous)  5,000 Units Subcutaneous Q8H  . sodium chloride flush  3 mL Intravenous Q12H   Continuous Infusions: . sodium chloride 75 mL/hr at 12/04/18 0636  . famotidine (PEPCID) IV 20 mg (11/20/18 1342)   PRN Meds:.acetaminophen **OR** acetaminophen, albuterol, ondansetron **OR** ondansetron (ZOFRAN) IV, polyethylene glycol Allergies  Allergen Reactions  . Azithromycin Shortness Of Breath  . Tape Other (See Comments)    PATIENT'S SKIN IS VERY THIN- TEARS AND BRUISES VERY EASILY!!!!  . Ciprofloxacin Other (See Comments)    Hallucinations   . Levofloxacin Other (See Comments)    Insomnia, indigestion, tingling sensation in legs  . Furosemide Rash and Other (See Comments)    Bullous pemphigoid = "a rare autoimmune skin condition causing large, fluid-filled blisters"  . Latex Rash  . Other Rash    EKG leads caused a rash that required steroids to clear   Review of Systems  Unable to perform ROS: Mental status change    Physical Exam Constitutional:      General: She is not in acute distress.    Appearance: She is ill-appearing. She is not diaphoretic.     Interventions: Face  mask in place.  HENT:     Head: Normocephalic and atraumatic.  Cardiovascular:     Rate and Rhythm: Tachycardia present. Rhythm irregular.  Pulmonary:     Effort: Accessory muscle usage present.     Breath sounds: Normal breath sounds.  Abdominal:     Palpations: Abdomen is soft.  Musculoskeletal:     Right lower leg: Edema present.     Left lower leg: Edema present.  Skin:    General: Skin is warm and dry.  Neurological:     Mental Status: She is unresponsive.     Vital Signs: BP (!) 109/59 (BP Location: Right Arm)   Pulse 99  Temp 97.6 F (36.4 C) (Oral)   Resp (!) 22   Ht 5' 7.5" (1.715 m)   Wt 81.2 kg   SpO2 92%   BMI 27.62 kg/m  Pain Scale: Faces   Pain Score: 0-No pain   SpO2: SpO2: 92 % O2 Device:SpO2: 92 % O2 Flow Rate: .O2 Flow Rate (L/min): 3 L/min  IO: Intake/output summary:   Intake/Output Summary (Last 24 hours) at Dec 20, 2018 1020 Last data filed at 11/20/2018 1900 Gross per 24 hour  Intake 50 ml  Output 300 ml  Net -250 ml    LBM: Last BM Date: 10/24/2018 Baseline Weight: Weight: 81.2 kg Most recent weight: Weight: 81.2 kg     Palliative Assessment/Data:10%    Time Total: 0930-1030 1600-1700 120 minutes Greater than 50%  of this time was spent counseling and coordinating care related to the above assessment and plan.  Juel Burrow, DNP, AGNP-C Palliative Medicine Team [phone] Pager: [phone]

## 2018-12-20 NOTE — Care Management Important Message (Signed)
Important Message  Patient Details  Name: [name] MRN: [id number] Date of Birth: 02-05-1923   Medicare Important Message Given:  Yes    Dorena Bodo December 04, 2018, 2:58 PM

## 2018-12-20 NOTE — Progress Notes (Signed)
PROGRESS NOTE    [name]  EHU:[id number] DOB: 17-Jul-1923 DOA: 11/10/2018 PCP: Leamon Arnt, MD   Brief Narrative:  HPI per Dr. Karmen Bongo on 10/25/2018 [name] is a 85 y.o. female with medical history significant of dementia; HTN; HLD; COPD; and IFG presenting with confusion.  She has always been pretty alert, but her sitter during the day (daughter usually sees at least every other day) reported that she seemed to be getting better from recent UTI (seen week before last).  She was acting "not normal" at that time - forgot how to walk, forgot how to use the telephone.  She was started on Septra and she seemed to be improving x 1-2 days.  Then she started going downhill again - would not walk to the dining hall, decreased PO even while being hand fed.  Her daughter was concerned that the Gabapentin was causing her symptoms and stopped this, last dose Friday 1 1/2 weeks ago.  Her daughter called Dr. Jonni Sanger this AM because her sitter got there today and she has marked LE edema.  Marked edema, little appetite, minimal PO intake, confusion and somnolence.  **Remains obtunded and does not follow commands again.  Not taking p.o. by mouth and unable to participate in SLP. Discussed with Neuro and they recommend continuing to correct Renal Function. If Renal Fxn is improved and she is still encephalopathic they will evaluate. Nephrology consulted due to worsening Cr; Palliative also consulted due to condition. Cr is worsening despite interventions and after Palliative Discussion patient is to be transitioned to [facility] and all unnecessary measures not intended for the patient's Comfort will be D/C'd.   Assessment & Plan:   Principal Problem:   Acute metabolic encephalopathy Active Problems:   Essential hypertension   COPD (chronic obstructive pulmonary disease) (HCC)   Other abnormal glucose   Dementia (HCC)   Acute renal failure superimposed on stage 3 chronic kidney  disease (HCC)  Acute Metabolic Encephalopathy, persistent -Patient presenting with encephalopathy as evidenced by her obtunded/unresponsiveness; Remains obtunded and does not withdraw again to physical stimuli today -She recently had a UTI and has had waxing and waning mental status since then; Repeat UA and Urine Cx Negative -Evaluation thus far unremarkable other than acute renal failure which is likely related to patient's decreased PO intake and possibly related to recent use of Septra to treat UTI; Urine Cx last visit showed E Coli; UA this Admit not very indicative of UTI and Urine Cx Negative -There is no evidence of active infection at this time and she is afebrile but WBC is mildly elevated at 11.4 -Obtain Blood Cx x2 -She does take Depakote and recent level was 16 -Checked TSH, Ammonia, RPR, and Vitamin B12 -B12 was 1266, TSH was 2.857, and Ammonia Level was 22 and RPR Negative  -Checked MRI w/o Contrast and showed Moderate to Advance Motion degradation without acute finding and a temporal predominant atrophy and moderate chronic small vessel ischemia  -EEG done and showed occasional triphasic waves, generalized irregular slow activity, and slow posterior dominant rhythm.  The EEG was consistent with a generalized nonspecific cerebral dysfunction/encephalopathy though is nonspecific to try phasic waves could be associated with toxic/metabolic encephalopathy -Hold all sedating medications but was given 1 mg of IV Lorazepam for MRI; Was getting Benadyl and Fentanyl in the ED which have been stopped and Also stopped Zoloft, Depacon, and Loratidine  -Patient had acute urinary retention so will check UA/UCx and place Foley and  obtain Renal U/S -Will observe for now on telemetry with IVF hydration -Discussed the case with neurology Dr. Rory Percy and he recommends correcting renal function.  If renal function corrects to close to baseline she is still encephalopathic neurology will formally  consult -Nephrology consulted and discussed with family if no other etiologies are identified for her AMS they could consider a trial of HD but now not likely a candidate for HD -Nephrology checking VBG now but still not done -Palliative Consulted for [name] and after extensive discussion patient to be transferred to [place]  Acute renal failure on stage 3 CKD; Worsening  -As noted above, likely associated with decreased PO intake and possibly with recent use of Septra -Recent Baseline has been around 1.0-1.1 Cr -Gently hydrate and c/w IVF with LR ar 100 mLhr but this has stopped and changed to D5W at 75 mL/hr given her Hypernatermia and Nephrology added NS at 75 mL/hr and recommeded stopping D5W at 75 mL/hr -Change Enoxaparin to Heparin 5,000 units sq q8h -AMS may be associated with her dehydration -Patient's BUN/Cr went from 62/2.47 -> 63/2.15 -> 68/1.98 -> 85/2.09 -> 80/2.45 -> 100/3.77 -Hold po Medications  -Checked Urine Lytes, UAm Urine Cx, and Place Foley -Urinalysis unremarkable -Urine Osm was 410, Urine K+ was 39, Urine Sodium was <10, Urine Cr was 164.99 -Check Renal U/S -Avoid Nephrotoxic Medications if Possible -Nephrology following and appreciate further evaluation and recommendations  -Per Nephrology recommending Maintenance IVF and PRN bolus of Isotonic Fluid for Hypotension and they have resumed NS at 75 mL/hr -After GOC discussion patient is to be transferred to [place]  Acute Urinary Retention -Check UA and Urine Cx -Check Urine Electrolytes -Checked Renal U/S and showed No obstructive uropathy identified bilaterally. Left kidney cyst. -C/w Foley Catheter for Comfort  Hyperkalemia -Improved. K+ went from 5.3 -> 5.0 -> 5.6 -> 5.4 -> 61 -Gave Temporazing measures and given 1 AMP of Sodium Bicarbonate, 5 units of insulin and an amp of D50 given yuesterday -C/w IVF Hydration as above per Nephrplogy -Will not check Bloodwork as patient is being made Comfort  Care  Hypernatremia -Improved and Na+ is 143 -Will not Check Bloodwork as patient is now going to be Comfort Care  Dementia -Patient's daughter reports mild dementia -C/w MRI as above  -Divalproex 250 mg po qHS has now been discontinued -Delirium Precautions  -Nephrology feels that AMS is out of proportion to Renal Fxn alone and Neurology feels that AMS is the because of Abnormal Renal Fxn -Have consulted Palliative for GOC and they are transitioning patient to Comfort Measures after discussion with Family  HTN -Discontinued Amlodipine due to inability to take in po -She was hypotensive upon arrival in the ER  -Likely hypovolemic so will continue IVF as above -Avoid Hypotension and PRN Isotonic Fluid Boluses for Hypotension  -Will transition to Comfort Measures  COPD -Patient does not appear able to participate in Advair inhalation at this time -DuoNeb stopped and will continue prn Albuterol  Hyperglycemia -May be stress response but likley to go up since starting D5W and an AMP of D50 -Glucose has been ranging from 144-198 -It is unlikely that she will need acute or chronic treatment for this issue -Patient is now Comfort Measures  Stage 1 Sacral Ulcer -poA -C/w Frequent turning and Mepilex  Leukocytosis -? Reactive -Continue to Monitor for S/Sx of Infection -Give IVF per Nephro Recc's -WBC slightly worse than yesterday -Checked Blood Cx x2 and pending -Will not Check Blood Work again  as patient is being transitioned to [facility]  Hyperphosphatemia -In the setting of worsening kidney function -Potassium was 7.9 -Will not repeat Phos as patient is now Comfort Care  Hypermagnesemia -Patient's Mag Level this AM was 2.9 -Will not repeat Mag as patient is now Comfort Care  Bilateral Pleural Effusions -In the Setting of Fluid Resuscitation -Nephrology hesitant about giving additional IVF as may worsen  DVT prophylaxis: None now that patient is Comfort  Care Code Status: DO NOT RESUSCITATE Family Communication: No family present at bedside Disposition Plan: High Risk for Decompensation; [facility] but may have In-Hospital Death before then  Consultants: -Discussed Case with Neurology -Nephrology -Palliative Care   Procedures:   None   Antimicrobials:  Anti-infectives (From admission, onward)   None     Subjective: Remains obtunded and does not respond to painful stimuli or withdrawal.  She not responsive.  Palliative Care met with the patient's family today and because patient has not shown any clinical improvement and because she is in process of dying they have transition her to comfort care.  All medications not focused on the patient's comfort have been discontinued and will not recheck blood work in the a.m.  Objective: Vitals:   12/07/18 1304 12/07/2018 1336 12/07/2018 1436 2018-12-07 1536  BP: (!) 110/54 (!) 101/54 (!) 104/58 (!) 105/52  Pulse:      Resp:      Temp:      TempSrc:      SpO2: 96%     Weight:      Height:        Intake/Output Summary (Last 24 hours) at 12-07-18 1653 Last data filed at 11/20/2018 1900 Gross per 24 hour  Intake 0 ml  Output -  Net 0 ml   Filed Weights   10/24/2018 1921  Weight: 81.2 kg   Examination: Physical Exam:  Constitutional: Remains obtunded and unresponsive.  Somnolent and has shallow breathing a little bit. Eyes: Lids and conjunctive are normal.  Sclera anicteric ENMT: External ears and nose appear normal.  Mucous members are still seeming a little dry but improved from yesterday Neck: Appears normal with no JVD Respiratory: Diminished to auscultation bilaterally with no appreciable wheezing, rales, rhonchi.  Patient not tachypneic or using any accessory muscles to breathe but is wearing supplemental oxygen via nasal cannula in his mouth breathing with some shallow breaths Cardiovascular: Tachycardic rate but regular rhythm. Abdomen: Soft, nontender,  nondistended.  Bowel sounds present GU: Deferred Musculoskeletal: No appreciable rashes or lesions on limited skin evaluation Skin: No appreciable rashes or lesions on physical evaluation but does have a sacral wound stage of cutis ulcer.  Her legs are little bit more swollen today Neurologic: Does not follow commands and does not respond to any stimuli.  Does not even withdraw from painful stimuli today Psychiatric: Impaired judgment and insight.  She is not awake or alert or oriented.  Does not respond to any stimuli  Data Reviewed: I have personally reviewed following labs and imaging studies  CBC: Recent Labs  Lab 11/16/2018 1024 11/18/18 0442 11/19/18 0438 11/20/18 0402 12-07-2018 0601  WBC 8.0 6.4 9.5 11.4* 11.8*  NEUTROABS  --   --  8.0* 9.3* 9.9*  HGB 13.7 13.4 14.5 14.4 15.1*  HCT 43.8 43.3 47.5* 50.9* 54.0*  MCV 91.6 94.5 96.5 98.8 101.5*  PLT 252 224 222 195 119*   Basic Metabolic Panel: Recent Labs  Lab 11/19/18 0438 11/19/18 1244 11/19/18 1513 11/20/18 0402 2018-12-07 1478  2018/11/30 1448  NA 145 146* 146* 144 142 143  K 5.4* 5.6* 5.7* 5.4* 6.1* 5.4*  CL 101 103 103 100 106 105  CO2 34* 33* 33* 35* 28 30  GLUCOSE 132* 141* 142* 180* 118* 142*  BUN 68* 85* 89* 80* 92* 100*  CREATININE 1.98* 2.09* 2.19* 2.45* 3.36* 3.77*  CALCIUM 9.4 9.3 9.2 8.9 8.8* 8.5*  MG 2.8*  --   --  2.9* 2.8*  --   PHOS 5.7*  --   --  6.3* 7.9*  --    GFR: Estimated Creatinine Clearance: 10.3 mL/min (A) (by C-G formula based on SCr of 3.77 mg/dL (H)). Liver Function Tests: Recent Labs  Lab 10/28/2018 1024 11/19/18 0438 11/20/18 0402 30-Nov-2018 0859  AST 37 38 35 25  ALT 22 35 54* 46*  ALKPHOS 90 92 87 84  BILITOT 1.1 0.4 0.4 0.4  PROT 6.9 6.6 6.7 6.1*  ALBUMIN 3.5 3.2* 2.9* 2.8*   No results for input(s): LIPASE, AMYLASE in the last 168 hours. Recent Labs  Lab 11/18/18 1210  AMMONIA 22   Coagulation Profile: No results for input(s): INR, PROTIME in the last 168 hours. Cardiac  Enzymes: No results for input(s): CKTOTAL, CKMB, CKMBINDEX, TROPONINI in the last 168 hours. BNP (last 3 results) No results for input(s): PROBNP in the last 8760 hours. HbA1C: No results for input(s): HGBA1C in the last 72 hours. CBG: Recent Labs  Lab 2018/11/30 1303 30-Nov-2018 1335  GLUCAP 198* 144*   Lipid Profile: No results for input(s): CHOL, HDL, LDLCALC, TRIG, CHOLHDL, LDLDIRECT in the last 72 hours. Thyroid Function Tests: No results for input(s): TSH, T4TOTAL, FREET4, T3FREE, THYROIDAB in the last 72 hours. Anemia Panel: No results for input(s): VITAMINB12, FOLATE, FERRITIN, TIBC, IRON, RETICCTPCT in the last 72 hours. Sepsis Labs: Recent Labs  Lab 10/22/2018 1100  LATICACIDVEN 1.87    Recent Results (from the past 240 hour(s))  Culture, Urine     Status: None   Collection Time: 11/20/18  2:16 PM  Result Value Ref Range Status   Specimen Description URINE, CATHETERIZED  Final   Special Requests NONE  Final   Culture   Final    NO GROWTH Performed at [facility], [address], [place], [place] [address]    Report Status 2018/11/30 FINAL  Final     RN Pressure Injury Documentation: Pressure Ulcer 04/23/16 Stage I -  Intact skin with non-blanchable redness of a localized area usually over a bony prominence. redness with healing scars (Active)  04/23/16 1912  Location: Buttocks  Location Orientation: Right;Left  Staging: Stage I -  Intact skin with non-blanchable redness of a localized area usually over a bony prominence.  Wound Description (Comments): redness with healing scars  Present on Admission: Yes     Radiology Studies: US Renal  Result Date: 11/20/2018 CLINICAL DATA:  Acute kidney injury.  Hypertension. EXAM: RENAL / URINARY TRACT ULTRASOUND COMPLETE COMPARISON:  None. FINDINGS: Right Kidney: Renal measurements: 10 x 3.9 x 9.6 cm = volume: 93.8 mL . Echogenicity within normal limits. No mass or hydronephrosis visualized. Left Kidney: Renal  measurements: 10.7 x 6.2 x 4.9 cm = volume: 170.1 mL. Echogenicity within normal limits. No mass or hydronephrosis visualized. Small anechoic cyst within the interpolar left kidney measures 1.0 x 1.3 x 0.9 cm. Bladder: Appears normal for degree of bladder distention. Other: Left pleural effusion noted. IMPRESSION: 1. No obstructive uropathy identified bilaterally. 2. Left kidney cyst. Electronically Signed   By:  Kerby Moors M.D.   On: 11/20/2018 09:04   Dg Chest Port 1 View  Result Date: 12/18/18 CLINICAL DATA:  Shortness of breath, hypoxia, history acute kidney injury, COPD, dementia, hypertension EXAM: PORTABLE CHEST 1 VIEW COMPARISON:  Portable exam 0653 hours compared to 10/30/2018 FINDINGS: Rotated to the LEFT. Normal heart size, mediastinal contours, and pulmonary vascularity. Atherosclerotic calcification aorta. Bibasilar effusions and atelectasis. No infiltrate or pneumothorax. Bones demineralized. IMPRESSION: Bibasilar effusions and atelectasis. Electronically Signed   By: Lavonia Dana M.D.   On: 12/18/18 08:17   Scheduled Meds: . sodium chloride flush  3 mL Intravenous Q12H   Continuous Infusions:   LOS: 3 days   Kerney Elbe, DO [facility] PAGER is on AMION  If 7PM-7AM, please contact night-coverage [email or web address] Password [email or web address] 12/18/2018, 4:53 PM

## 2018-12-20 NOTE — Death Summary Note (Signed)
DEATH SUMMARY   Patient Details  Name: [name] MRN: [id number] DOB: [birth year]  Admission/Discharge Information   Admit Date:  12/01/18  Date of Death: Date of Death: Dec 05, 2018  Time of Death: Time of Death: 2234-03-25  Length of Stay: 3  Referring Physician: Willow Ora, MD   Reason(s) for Hospitalization  Confusion   Diagnoses  Preliminary cause of death:   Cardiopulmonary Arrest in the setting of Worsening Respiratory Failure and Hypoxia from Pleural Effusions and Uremia  Secondary Diagnoses (including complications and co-morbidities):   Acute Metabolic Encephalopathy, persistent  Acute renal failure on stage 3 CKD; Worsening   Acute Urinary Retention  Hyperkalemia  Hypernatremia  Dementia  HTN  COPD  Hyperglycemia  Stage 1 Sacral Ulcer  Leukocytosis  Hyperphosphatemia  Hypermagnesemia  Bilateral Pleural Effusions  Principal Problem:   Acute metabolic encephalopathy Active Problems:   Essential hypertension   COPD (chronic obstructive pulmonary disease) (HCC)   Other abnormal glucose   Dementia (HCC)   Acute renal failure superimposed on stage 3 chronic kidney disease (HCC)   AKI (acute kidney injury) (HCC)   Goals of care, counseling/discussion   Palliative care by specialist   Comfort measures only status   Brief Hospital Course (including significant findings, care, treatment, and services provided and events leading to death)  HPI per Dr. Jonah Blue on 12/01/2018 Gwendolyn Shutters Rumleyis a 85 y.o.femalewith medical history significant ofdementia; HTN; HLD; COPD; and IFG presenting with confusion.She has always been pretty alert, but her sitter during the day (daughter usually sees at least every other day) reported that she seemed to be getting better from recent UTI (seen week before last). She was acting "not normal" at that time - forgot how to walk, forgot how to use the telephone. She was started on Septra and  she seemed to be improving x 1-2 days. Then she started going downhill again - would not walk to the dining hall, decreased PO even while being hand fed. Her daughter was concerned that the Gabapentin was causing her symptoms and stopped this, last dose Friday 1 1/2 weeks ago. Her daughter called Dr. Mardelle Matte this AM because her sitter got there today and she has marked LE edema. Marked edema, little appetite, minimal PO intake, confusion and somnolence.  **Remained obtunded and does not follow commands again.  Not taking p.o. by mouth and unable to participate in SLP. Discussed with Neuro and they recommend continuing to correct Renal Function. If Renal Fxn is improved and she is still encephalopathic they will evaluate. Nephrology consulted due to worsening Cr; Palliative also consulted due to condition. Cr wasworsening despite interventions and after Palliative Discussion patient is to be transitioned to Comfort Care and all unnecessary measures not intended for the patient's Comfort will be D/C'd. She passed away on the night of 12/05/2017 on 22:35 as she was a high risk for decompensation due to her medical comorbidities.   Pertinent Labs and Studies  Significant Diagnostic Studies Ct Head Wo Contrast  Result Date: December 01, 2018 CLINICAL DATA:  History of dementia. Worsening mental status. Recent fall. Atrial fibrillation. EXAM: CT HEAD WITHOUT CONTRAST TECHNIQUE: Contiguous axial images were obtained from the base of the skull through the vertex without intravenous contrast. COMPARISON:  04/23/2016. FINDINGS: Brain: No evidence for acute infarction, hemorrhage, mass lesion, hydrocephalus, or extra-axial fluid. Generalized atrophy. Hypoattenuation of white matter, likely small vessel disease. Vascular: Calcification of the cavernous internal carotid arteries consistent with cerebrovascular atherosclerotic disease. No signs of intracranial large vessel  occlusion. Skull: Motion degraded, but calvarium grossly  intact. Sinuses/Orbits: No acute findings. Other: None. IMPRESSION: Atrophy and small vessel disease. No acute intracranial findings. Similar appearance to priors. Electronically Signed   By: Elsie StainJohn T Curnes M.D.   On: 11/05/2018 11:45   Mr Brain Wo Contrast  Result Date: 11/18/2018 CLINICAL DATA:  Altered level of consciousness, unexplained EXAM: MRI HEAD WITHOUT CONTRAST TECHNIQUE: Multiplanar, multiecho pulse sequences of the brain and surrounding structures were obtained without intravenous contrast. COMPARISON:  Head CT from yesterday FINDINGS: Brain: No acute infarction, hemorrhage, hydrocephalus, extra-axial collection or mass masslike finding. Cerebral volume loss most notable in the temporal lobes. Patient has history of dementia and this may reflect Alzheimer's disease. Moderate chronic small vessel ischemia in the cerebral white matter. Vascular: Major flow voids are preserved Skull and upper cervical spine: Negative for marrow lesion Sinuses/Orbits: Bilateral cataract resection. Other: Persistent and at times significantly motion degraded exam. IMPRESSION: 1. Moderate to advanced motion degradation without acute finding. 2. Temporal predominant atrophy and moderate chronic small vessel ischemia. Electronically Signed   By: Marnee SpringJonathon  Watts M.D.   On: 11/18/2018 16:54   [place] Renal  Result Date: 11/20/2018 CLINICAL DATA:  Acute kidney injury.  Hypertension. EXAM: RENAL / URINARY TRACT ULTRASOUND COMPLETE COMPARISON:  None. FINDINGS: Right Kidney: Renal measurements: 10 x 3.9 x 9.6 cm = volume: 93.8 mL . Echogenicity within normal limits. No mass or hydronephrosis visualized. Left Kidney: Renal measurements: 10.7 x 6.2 x 4.9 cm = volume: 170.1 mL. Echogenicity within normal limits. No mass or hydronephrosis visualized. Small anechoic cyst within the interpolar left kidney measures 1.0 x 1.3 x 0.9 cm. Bladder: Appears normal for degree of bladder distention. Other: Left pleural effusion noted. IMPRESSION:  1. No obstructive uropathy identified bilaterally. 2. Left kidney cyst. Electronically Signed   By: Signa Kellaylor  Stroud M.D.   On: 11/20/2018 09:04   Dg Chest Port 1 View  Result Date: 12/18/2018 CLINICAL DATA:  Shortness of breath, hypoxia, history acute kidney injury, COPD, dementia, hypertension EXAM: PORTABLE CHEST 1 VIEW COMPARISON:  Portable exam 0653 hours compared to 11/13/2018 FINDINGS: Rotated to the LEFT. Normal heart size, mediastinal contours, and pulmonary vascularity. Atherosclerotic calcification aorta. Bibasilar effusions and atelectasis. No infiltrate or pneumothorax. Bones demineralized. IMPRESSION: Bibasilar effusions and atelectasis. Electronically Signed   By: Ulyses SouthwardMark  Boles M.D.   On: 2019/08/27 08:17   Dg Chest Port 1 View  Result Date: 11/18/2018 CLINICAL DATA:  Confusion, UTI last week. New onset atrial fibrillation EXAM: PORTABLE CHEST 1 VIEW COMPARISON:  08/27/2017 FINDINGS: There is mild bilateral interstitial thickening. There is no focal parenchymal opacity. There is no pneumothorax. There is a possible trace left pleural effusion. There is stable cardiomegaly. The osseous structures are unremarkable. IMPRESSION: Mild bilateral interstitial thickening likely chronic. Mild superimposed interstitial edema is not excluded. Electronically Signed   By: Elige KoHetal  Patel   On: 11/09/2018 11:01    Microbiology Recent Results (from the past 240 hour(s))  Culture, Urine     Status: None   Collection Time: 11/20/18  2:16 PM  Result Value Ref Range Status   Specimen Description URINE, CATHETERIZED  Final   Special Requests NONE  Final   Culture   Final    NO GROWTH Performed at [facility], [address] [place], [place] [address]    Report Status 2019/08/27 FINAL  Final  Culture, blood (routine x 2)     Status: None (Preliminary result)   Collection Time: 2019-07-07  2:40 PM  Result Value Ref Range Status   Specimen Description BLOOD LEFT HAND  Final   Special Requests    Final    BOTTLES DRAWN AEROBIC ONLY Blood Culture adequate volume   Culture NO GROWTH < 24 HOURS  Final   Report Status PENDING  Incomplete  Culture, blood (routine x 2)     Status: None (Preliminary result)   Collection Time: 12/12/2018  2:46 PM  Result Value Ref Range Status   Specimen Description BLOOD LEFT ARM  Final   Special Requests   Final    BOTTLES DRAWN AEROBIC ONLY Blood Culture adequate volume   Culture NO GROWTH < 24 HOURS  Final   Report Status PENDING  Incomplete   Lab Basic Metabolic Panel: Recent Labs  Lab 11/19/18 0438 11/19/18 1244 11/19/18 1513 11/20/18 0402 12/02/2018 0859 12/02/2018 1448  NA 145 146* 146* 144 142 143  K 5.4* 5.6* 5.7* 5.4* 6.1* 5.4*  CL 101 103 103 100 106 105  CO2 34* 33* 33* 35* 28 30  GLUCOSE 132* 141* 142* 180* 118* 142*  BUN 68* 85* 89* 80* 92* 100*  CREATININE 1.98* 2.09* 2.19* 2.45* 3.36* 3.77*  CALCIUM 9.4 9.3 9.2 8.9 8.8* 8.5*  MG 2.8*  --   --  2.9* 2.8*  --   PHOS 5.7*  --   --  6.3* 7.9*  --    Liver Function Tests: Recent Labs  Lab 2018-03-12 1024 11/19/18 0438 11/20/18 0402 11/20/2018 0859  AST 37 38 35 25  ALT 22 35 54* 46*  ALKPHOS 90 92 87 84  BILITOT 1.1 0.4 0.4 0.4  PROT 6.9 6.6 6.7 6.1*  ALBUMIN 3.5 3.2* 2.9* 2.8*   No results for input(s): LIPASE, AMYLASE in the last 168 hours. Recent Labs  Lab 11/18/18 1210  AMMONIA 22   CBC: Recent Labs  Lab 2018-03-12 1024 11/18/18 0442 11/19/18 0438 11/20/18 0402 11/20/2018 0601  WBC 8.0 6.4 9.5 11.4* 11.8*  NEUTROABS  --   --  8.0* 9.3* 9.9*  HGB 13.7 13.4 14.5 14.4 15.1*  HCT 43.8 43.3 47.5* 50.9* 54.0*  MCV 91.6 94.5 96.5 98.8 101.5*  PLT 252 224 222 195 141*   Cardiac Enzymes: No results for input(s): CKTOTAL, CKMB, CKMBINDEX, TROPONINI in the last 168 hours. Sepsis Labs: Recent Labs  Lab 2018-03-12 1100 11/18/18 0442 11/19/18 0438 11/20/18 0402 12/12/2018 0601  WBC  --  6.4 9.5 11.4* 11.8*  LATICACIDVEN 1.87  --   --   --   --      Procedures/Operations  None  Merlene Laughtermair Latif Starkeisha Vanwinkle, DO 11/22/2018, 7:57 AM

## 2018-12-20 NOTE — Progress Notes (Signed)
At 2235, noted pt not breathing, breath sound not present, no pulses, cold and cyanotic extremities. Assessment verified with charge nurse, Phyll.  NP (K.Shorr) notified. Dtr [name]) notified over phone and accepting/expected.

## 2018-12-20 DEATH — deceased

## 2019-03-11 ENCOUNTER — Ambulatory Visit: Payer: Medicare Other | Admitting: Neurology
